# Patient Record
Sex: Male | Born: 1955 | Race: Black or African American | Hispanic: No | Marital: Single | Smoking: Current every day smoker
Health system: Southern US, Community
[De-identification: ages and names within clinical notes are randomized; demographics above are authoritative.]

## PROBLEM LIST (undated history)

## (undated) DIAGNOSIS — Z789 Other specified health status: Secondary | ICD-10-CM

---

## 2016-02-27 DIAGNOSIS — Z139 Encounter for screening, unspecified: Secondary | ICD-10-CM

## 2016-02-27 NOTE — Congregational Nurse Program (Unsigned)
Congregational Nurse Program Note  Date of Encounter: 02/27/2016  Past Medical History: No past medical history on file.  Encounter Details:     CNP Questionnaire - 02/27/16 1534    Patient Demographics   Is this a new or existing patient? New   Race Other   Patient Assistance   Location of Patient Assistance Not Applicable   Patient's financial/insurance status Medicaid;Low Income   Uninsured Patient No   Patient referred to apply for the following financial assistance Not Applicable   Food insecurities addressed Provided food supplies   Transportation assistance No   Assistance securing medications No   Educational health offerings Hypertension   Encounter Details   Primary purpose of visit Education/Health Concerns   Was an Emergency Department visit averted? Not Applicable   Does patient have a medical provider? No   Patient referred to Not Applicable   Was a mental health screening completed? (GAINS tool) No   Does patient have dental issues? No   Does patient have vision issues? No   Since previous encounter, have you referred patient for abnormal blood pressure that resulted in a new diagnosis or medication change? No   For Abstraction Use Only   Does patient have insurance? Yes       Has just arrived from [place].  Requested B/P check

## 2018-10-20 ENCOUNTER — Ambulatory Visit: Payer: Self-pay | Admitting: Family Medicine

## 2020-03-07 ENCOUNTER — Inpatient Hospital Stay (HOSPITAL_COMMUNITY): Payer: Medicaid Other

## 2020-03-07 ENCOUNTER — Inpatient Hospital Stay (HOSPITAL_COMMUNITY)
Admission: EM | Admit: 2020-03-07 | Discharge: 2020-07-03 | DRG: 003 | Disposition: A | Discharge status: Skilled Nursing Facility | Payer: Medicaid Other | Attending: Internal Medicine | Admitting: Internal Medicine

## 2020-03-07 ENCOUNTER — Encounter (HOSPITAL_COMMUNITY)
Admission: EM | Disposition: A | Discharge status: Skilled Nursing Facility | Payer: Self-pay | Source: Home / Self Care | Attending: Internal Medicine

## 2020-03-07 ENCOUNTER — Emergency Department (HOSPITAL_COMMUNITY): Payer: Medicaid Other

## 2020-03-07 ENCOUNTER — Encounter (HOSPITAL_COMMUNITY): Payer: Self-pay | Admitting: Physician Assistant

## 2020-03-07 DIAGNOSIS — N390 Urinary tract infection, site not specified: Secondary | ICD-10-CM | POA: Diagnosis not present

## 2020-03-07 DIAGNOSIS — I82443 Acute embolism and thrombosis of tibial vein, bilateral: Secondary | ICD-10-CM | POA: Diagnosis present

## 2020-03-07 DIAGNOSIS — C787 Secondary malignant neoplasm of liver and intrahepatic bile duct: Secondary | ICD-10-CM | POA: Diagnosis present

## 2020-03-07 DIAGNOSIS — I6523 Occlusion and stenosis of bilateral carotid arteries: Secondary | ICD-10-CM | POA: Diagnosis present

## 2020-03-07 DIAGNOSIS — F1290 Cannabis use, unspecified, uncomplicated: Secondary | ICD-10-CM | POA: Diagnosis present

## 2020-03-07 DIAGNOSIS — M7989 Other specified soft tissue disorders: Secondary | ICD-10-CM | POA: Diagnosis not present

## 2020-03-07 DIAGNOSIS — I2609 Other pulmonary embolism with acute cor pulmonale: Secondary | ICD-10-CM | POA: Diagnosis present

## 2020-03-07 DIAGNOSIS — D62 Acute posthemorrhagic anemia: Secondary | ICD-10-CM | POA: Diagnosis not present

## 2020-03-07 DIAGNOSIS — I468 Cardiac arrest due to other underlying condition: Secondary | ICD-10-CM | POA: Diagnosis present

## 2020-03-07 DIAGNOSIS — I615 Nontraumatic intracerebral hemorrhage, intraventricular: Secondary | ICD-10-CM | POA: Diagnosis not present

## 2020-03-07 DIAGNOSIS — J9621 Acute and chronic respiratory failure with hypoxia: Secondary | ICD-10-CM | POA: Diagnosis present

## 2020-03-07 DIAGNOSIS — I82453 Acute embolism and thrombosis of peroneal vein, bilateral: Secondary | ICD-10-CM | POA: Diagnosis present

## 2020-03-07 DIAGNOSIS — R1312 Dysphagia, oropharyngeal phase: Secondary | ICD-10-CM | POA: Diagnosis not present

## 2020-03-07 DIAGNOSIS — Z930 Tracheostomy status: Secondary | ICD-10-CM

## 2020-03-07 DIAGNOSIS — G931 Anoxic brain damage, not elsewhere classified: Secondary | ICD-10-CM | POA: Diagnosis present

## 2020-03-07 DIAGNOSIS — I469 Cardiac arrest, cause unspecified: Secondary | ICD-10-CM

## 2020-03-07 DIAGNOSIS — I10 Essential (primary) hypertension: Secondary | ICD-10-CM | POA: Diagnosis present

## 2020-03-07 DIAGNOSIS — J9600 Acute respiratory failure, unspecified whether with hypoxia or hypercapnia: Secondary | ICD-10-CM | POA: Diagnosis not present

## 2020-03-07 DIAGNOSIS — Z978 Presence of other specified devices: Secondary | ICD-10-CM

## 2020-03-07 DIAGNOSIS — I82433 Acute embolism and thrombosis of popliteal vein, bilateral: Secondary | ICD-10-CM | POA: Diagnosis present

## 2020-03-07 DIAGNOSIS — K0889 Other specified disorders of teeth and supporting structures: Secondary | ICD-10-CM | POA: Diagnosis not present

## 2020-03-07 DIAGNOSIS — Z09 Encounter for follow-up examination after completed treatment for conditions other than malignant neoplasm: Secondary | ICD-10-CM

## 2020-03-07 DIAGNOSIS — K769 Liver disease, unspecified: Secondary | ICD-10-CM

## 2020-03-07 DIAGNOSIS — M542 Cervicalgia: Secondary | ICD-10-CM | POA: Diagnosis not present

## 2020-03-07 DIAGNOSIS — R1310 Dysphagia, unspecified: Secondary | ICD-10-CM

## 2020-03-07 DIAGNOSIS — Z7189 Other specified counseling: Secondary | ICD-10-CM | POA: Diagnosis not present

## 2020-03-07 DIAGNOSIS — Z4659 Encounter for fitting and adjustment of other gastrointestinal appliance and device: Secondary | ICD-10-CM

## 2020-03-07 DIAGNOSIS — I2699 Other pulmonary embolism without acute cor pulmonale: Secondary | ICD-10-CM

## 2020-03-07 DIAGNOSIS — Z515 Encounter for palliative care: Secondary | ICD-10-CM

## 2020-03-07 DIAGNOSIS — I2602 Saddle embolus of pulmonary artery with acute cor pulmonale: Secondary | ICD-10-CM | POA: Diagnosis not present

## 2020-03-07 DIAGNOSIS — R160 Hepatomegaly, not elsewhere classified: Secondary | ICD-10-CM | POA: Diagnosis not present

## 2020-03-07 DIAGNOSIS — R4701 Aphasia: Secondary | ICD-10-CM | POA: Diagnosis not present

## 2020-03-07 DIAGNOSIS — Z681 Body mass index (BMI) 19 or less, adult: Secondary | ICD-10-CM

## 2020-03-07 DIAGNOSIS — J9601 Acute respiratory failure with hypoxia: Secondary | ICD-10-CM | POA: Diagnosis not present

## 2020-03-07 DIAGNOSIS — E861 Hypovolemia: Secondary | ICD-10-CM | POA: Diagnosis not present

## 2020-03-07 DIAGNOSIS — R739 Hyperglycemia, unspecified: Secondary | ICD-10-CM | POA: Diagnosis present

## 2020-03-07 DIAGNOSIS — T85598A Other mechanical complication of other gastrointestinal prosthetic devices, implants and grafts, initial encounter: Secondary | ICD-10-CM

## 2020-03-07 DIAGNOSIS — Z9181 History of falling: Secondary | ICD-10-CM

## 2020-03-07 DIAGNOSIS — R5381 Other malaise: Secondary | ICD-10-CM | POA: Diagnosis not present

## 2020-03-07 DIAGNOSIS — J9690 Respiratory failure, unspecified, unspecified whether with hypoxia or hypercapnia: Secondary | ICD-10-CM

## 2020-03-07 DIAGNOSIS — I63429 Cerebral infarction due to embolism of unspecified anterior cerebral artery: Secondary | ICD-10-CM | POA: Diagnosis not present

## 2020-03-07 DIAGNOSIS — C163 Malignant neoplasm of pyloric antrum: Secondary | ICD-10-CM | POA: Diagnosis present

## 2020-03-07 DIAGNOSIS — J439 Emphysema, unspecified: Secondary | ICD-10-CM | POA: Diagnosis present

## 2020-03-07 DIAGNOSIS — K254 Chronic or unspecified gastric ulcer with hemorrhage: Secondary | ICD-10-CM | POA: Diagnosis present

## 2020-03-07 DIAGNOSIS — R109 Unspecified abdominal pain: Secondary | ICD-10-CM

## 2020-03-07 DIAGNOSIS — D630 Anemia in neoplastic disease: Secondary | ICD-10-CM | POA: Diagnosis not present

## 2020-03-07 DIAGNOSIS — I82421 Acute embolism and thrombosis of right iliac vein: Secondary | ICD-10-CM | POA: Diagnosis present

## 2020-03-07 DIAGNOSIS — J40 Bronchitis, not specified as acute or chronic: Secondary | ICD-10-CM | POA: Diagnosis not present

## 2020-03-07 DIAGNOSIS — Z751 Person awaiting admission to adequate facility elsewhere: Secondary | ICD-10-CM

## 2020-03-07 DIAGNOSIS — A408 Other streptococcal sepsis: Secondary | ICD-10-CM | POA: Diagnosis not present

## 2020-03-07 DIAGNOSIS — E785 Hyperlipidemia, unspecified: Secondary | ICD-10-CM | POA: Diagnosis present

## 2020-03-07 DIAGNOSIS — I745 Embolism and thrombosis of iliac artery: Secondary | ICD-10-CM | POA: Diagnosis present

## 2020-03-07 DIAGNOSIS — M795 Residual foreign body in soft tissue: Secondary | ICD-10-CM

## 2020-03-07 DIAGNOSIS — F17210 Nicotine dependence, cigarettes, uncomplicated: Secondary | ICD-10-CM | POA: Diagnosis present

## 2020-03-07 DIAGNOSIS — E875 Hyperkalemia: Secondary | ICD-10-CM | POA: Diagnosis not present

## 2020-03-07 DIAGNOSIS — R451 Restlessness and agitation: Secondary | ICD-10-CM | POA: Diagnosis not present

## 2020-03-07 DIAGNOSIS — D72825 Bandemia: Secondary | ICD-10-CM | POA: Diagnosis not present

## 2020-03-07 DIAGNOSIS — R490 Dysphonia: Secondary | ICD-10-CM | POA: Diagnosis not present

## 2020-03-07 DIAGNOSIS — D72829 Elevated white blood cell count, unspecified: Secondary | ICD-10-CM

## 2020-03-07 DIAGNOSIS — R059 Cough, unspecified: Secondary | ICD-10-CM

## 2020-03-07 DIAGNOSIS — I611 Nontraumatic intracerebral hemorrhage in hemisphere, cortical: Secondary | ICD-10-CM | POA: Diagnosis not present

## 2020-03-07 DIAGNOSIS — I4510 Unspecified right bundle-branch block: Secondary | ICD-10-CM | POA: Diagnosis present

## 2020-03-07 DIAGNOSIS — Z7401 Bed confinement status: Secondary | ICD-10-CM

## 2020-03-07 DIAGNOSIS — I639 Cerebral infarction, unspecified: Secondary | ICD-10-CM | POA: Diagnosis not present

## 2020-03-07 DIAGNOSIS — F22 Delusional disorders: Secondary | ICD-10-CM | POA: Diagnosis not present

## 2020-03-07 DIAGNOSIS — E876 Hypokalemia: Secondary | ICD-10-CM | POA: Diagnosis not present

## 2020-03-07 DIAGNOSIS — I6340 Cerebral infarction due to embolism of unspecified cerebral artery: Secondary | ICD-10-CM | POA: Diagnosis not present

## 2020-03-07 DIAGNOSIS — R570 Cardiogenic shock: Secondary | ICD-10-CM | POA: Diagnosis present

## 2020-03-07 DIAGNOSIS — E43 Unspecified severe protein-calorie malnutrition: Secondary | ICD-10-CM | POA: Diagnosis present

## 2020-03-07 DIAGNOSIS — C7A8 Other malignant neuroendocrine tumors: Secondary | ICD-10-CM | POA: Diagnosis not present

## 2020-03-07 DIAGNOSIS — I82890 Acute embolism and thrombosis of other specified veins: Secondary | ICD-10-CM | POA: Diagnosis present

## 2020-03-07 DIAGNOSIS — E872 Acidosis: Secondary | ICD-10-CM | POA: Diagnosis present

## 2020-03-07 DIAGNOSIS — Z452 Encounter for adjustment and management of vascular access device: Secondary | ICD-10-CM | POA: Diagnosis not present

## 2020-03-07 DIAGNOSIS — I82413 Acute embolism and thrombosis of femoral vein, bilateral: Secondary | ICD-10-CM | POA: Diagnosis present

## 2020-03-07 DIAGNOSIS — Z66 Do not resuscitate: Secondary | ICD-10-CM | POA: Diagnosis not present

## 2020-03-07 DIAGNOSIS — J9503 Malfunction of tracheostomy stoma: Secondary | ICD-10-CM | POA: Diagnosis not present

## 2020-03-07 DIAGNOSIS — A409 Streptococcal sepsis, unspecified: Secondary | ICD-10-CM | POA: Diagnosis not present

## 2020-03-07 DIAGNOSIS — D649 Anemia, unspecified: Secondary | ICD-10-CM | POA: Diagnosis not present

## 2020-03-07 DIAGNOSIS — Z20822 Contact with and (suspected) exposure to covid-19: Secondary | ICD-10-CM | POA: Diagnosis present

## 2020-03-07 DIAGNOSIS — K922 Gastrointestinal hemorrhage, unspecified: Secondary | ICD-10-CM | POA: Clinically undetermined

## 2020-03-07 DIAGNOSIS — R54 Age-related physical debility: Secondary | ICD-10-CM | POA: Diagnosis present

## 2020-03-07 DIAGNOSIS — R29700 NIHSS score 0: Secondary | ICD-10-CM | POA: Diagnosis not present

## 2020-03-07 DIAGNOSIS — R64 Cachexia: Secondary | ICD-10-CM | POA: Diagnosis present

## 2020-03-07 DIAGNOSIS — R2681 Unsteadiness on feet: Secondary | ICD-10-CM | POA: Diagnosis not present

## 2020-03-07 DIAGNOSIS — W19XXXA Unspecified fall, initial encounter: Secondary | ICD-10-CM

## 2020-03-07 DIAGNOSIS — F1010 Alcohol abuse, uncomplicated: Secondary | ICD-10-CM | POA: Diagnosis present

## 2020-03-07 DIAGNOSIS — E162 Hypoglycemia, unspecified: Secondary | ICD-10-CM | POA: Diagnosis present

## 2020-03-07 DIAGNOSIS — K5900 Constipation, unspecified: Secondary | ICD-10-CM | POA: Diagnosis not present

## 2020-03-07 HISTORY — PX: ESOPHAGOGASTRODUODENOSCOPY (EGD) WITH PROPOFOL: SHX5813

## 2020-03-07 HISTORY — PX: IR EMBO ART  VEN HEMORR LYMPH EXTRAV  INC GUIDE ROADMAPPING: IMG5450

## 2020-03-07 HISTORY — PX: IR ANGIOGRAM SELECTIVE EACH ADDITIONAL VESSEL: IMG667

## 2020-03-07 HISTORY — DX: Cardiac arrest, cause unspecified: I46.9

## 2020-03-07 HISTORY — DX: Other specified health status: Z78.9

## 2020-03-07 HISTORY — PX: IR THROMBECT PRIM MECH INIT (INCLU) MOD SED: IMG2297

## 2020-03-07 HISTORY — PX: IR ANGIOGRAM VISCERAL SELECTIVE: IMG657

## 2020-03-07 HISTORY — PX: IR ANGIOGRAM PULMONARY BILATERAL SELECTIVE: IMG664

## 2020-03-07 HISTORY — PX: IR US GUIDE VASC ACCESS RIGHT: IMG2390

## 2020-03-07 HISTORY — PX: IR IVC FILTER PLMT / S&I /IMG GUID/MOD SED: IMG701

## 2020-03-07 LAB — CBC
HCT: 41 % (ref 39.0–52.0)
HCT: 35.4 % — ABNORMAL LOW (ref 39.0–52.0)
HCT: 36 % — ABNORMAL LOW (ref 39.0–52.0)
Hemoglobin: 13.3 g/dL (ref 13.0–17.0)
Hemoglobin: 11.9 g/dL — ABNORMAL LOW (ref 13.0–17.0)
Hemoglobin: 11.8 g/dL — ABNORMAL LOW (ref 13.0–17.0)
MCH: 32.7 pg (ref 26.0–34.0)
MCH: 33.1 pg (ref 26.0–34.0)
MCH: 32.8 pg (ref 26.0–34.0)
MCHC: 32.4 g/dL (ref 30.0–36.0)
MCHC: 33.6 g/dL (ref 30.0–36.0)
MCHC: 32.8 g/dL (ref 30.0–36.0)
MCV: 100.7 fL — ABNORMAL HIGH (ref 80.0–100.0)
MCV: 98.3 fL (ref 80.0–100.0)
MCV: 100 fL (ref 80.0–100.0)
Platelets: 161 10*3/uL (ref 150–400)
Platelets: 154 10*3/uL (ref 150–400)
Platelets: 155 10*3/uL (ref 150–400)
RBC: 4.07 MIL/uL — ABNORMAL LOW (ref 4.22–5.81)
RBC: 3.6 MIL/uL — ABNORMAL LOW (ref 4.22–5.81)
RBC: 3.6 MIL/uL — ABNORMAL LOW (ref 4.22–5.81)
RDW: 15.3 % (ref 11.5–15.5)
RDW: 15.3 % (ref 11.5–15.5)
RDW: 15.3 % (ref 11.5–15.5)
WBC: 13.1 10*3/uL — ABNORMAL HIGH (ref 4.0–10.5)
WBC: 10.8 10*3/uL — ABNORMAL HIGH (ref 4.0–10.5)
WBC: 10.8 10*3/uL — ABNORMAL HIGH (ref 4.0–10.5)
nRBC: 0 % (ref 0.0–0.2)
nRBC: 0 % (ref 0.0–0.2)
nRBC: 0 % (ref 0.0–0.2)

## 2020-03-07 LAB — BASIC METABOLIC PANEL
Anion gap: 9 (ref 5–15)
Anion gap: 7 (ref 5–15)
Anion gap: 7 (ref 5–15)
BUN: 9 mg/dL (ref 8–23)
BUN: 8 mg/dL (ref 8–23)
BUN: 8 mg/dL (ref 8–23)
CO2: 22 mmol/L (ref 22–32)
CO2: 20 mmol/L — ABNORMAL LOW (ref 22–32)
CO2: 20 mmol/L — ABNORMAL LOW (ref 22–32)
Calcium: 7.5 mg/dL — ABNORMAL LOW (ref 8.9–10.3)
Calcium: 7.4 mg/dL — ABNORMAL LOW (ref 8.9–10.3)
Calcium: 7.5 mg/dL — ABNORMAL LOW (ref 8.9–10.3)
Chloride: 103 mmol/L (ref 98–111)
Chloride: 104 mmol/L (ref 98–111)
Chloride: 108 mmol/L (ref 98–111)
Creatinine, Ser: 0.9 mg/dL (ref 0.61–1.24)
Creatinine, Ser: 0.76 mg/dL (ref 0.61–1.24)
Creatinine, Ser: 0.79 mg/dL (ref 0.61–1.24)
GFR calc Af Amer: >60 mL/min (ref >60)
GFR calc Af Amer: >60 mL/min (ref >60)
GFR calc Af Amer: >60 mL/min (ref >60)
GFR calc non Af Amer: >60 mL/min (ref >60)
GFR calc non Af Amer: >60 mL/min (ref >60)
GFR calc non Af Amer: >60 mL/min (ref >60)
Glucose, Bld: 196 mg/dL — ABNORMAL HIGH (ref 70–99)
Glucose, Bld: 110 mg/dL — ABNORMAL HIGH (ref 70–99)
Glucose, Bld: 119 mg/dL — ABNORMAL HIGH (ref 70–99)
Potassium: 3.6 mmol/L (ref 3.5–5.1)
Potassium: 4.2 mmol/L (ref 3.5–5.1)
Potassium: 4.5 mmol/L (ref 3.5–5.1)
Sodium: 134 mmol/L — ABNORMAL LOW (ref 135–145)
Sodium: 131 mmol/L — ABNORMAL LOW (ref 135–145)
Sodium: 135 mmol/L (ref 135–145)

## 2020-03-07 LAB — CBC WITH DIFFERENTIAL/PLATELET
Abs Immature Granulocytes: 0.21 10*3/uL — ABNORMAL HIGH (ref 0.00–0.07)
Basophils Absolute: 0 10*3/uL (ref 0.0–0.1)
Basophils Relative: 0 %
Eosinophils Absolute: 0 10*3/uL (ref 0.0–0.5)
Eosinophils Relative: 0 %
HCT: 40.9 % (ref 39.0–52.0)
Hemoglobin: 12 g/dL — ABNORMAL LOW (ref 13.0–17.0)
Immature Granulocytes: 3 %
Lymphocytes Relative: 42 %
Lymphs Abs: 3 10*3/uL (ref 0.7–4.0)
MCH: 32.8 pg (ref 26.0–34.0)
MCHC: 29.3 g/dL — ABNORMAL LOW (ref 30.0–36.0)
MCV: 111.7 fL — ABNORMAL HIGH (ref 80.0–100.0)
Monocytes Absolute: 0.6 10*3/uL (ref 0.1–1.0)
Monocytes Relative: 9 %
Neutro Abs: 3.3 10*3/uL (ref 1.7–7.7)
Neutrophils Relative %: 46 %
Platelets: 100 10*3/uL — ABNORMAL LOW (ref 150–400)
RBC: 3.66 MIL/uL — ABNORMAL LOW (ref 4.22–5.81)
RDW: 15.9 % — ABNORMAL HIGH (ref 11.5–15.5)
WBC: 7.1 10*3/uL (ref 4.0–10.5)
nRBC: 0.3 % — ABNORMAL HIGH (ref 0.0–0.2)

## 2020-03-07 LAB — URINALYSIS, ROUTINE W REFLEX MICROSCOPIC
Bilirubin Urine: NEGATIVE
Glucose, UA: >=500 mg/dL — AB
Ketones, ur: NEGATIVE mg/dL
Leukocytes,Ua: NEGATIVE
Nitrite: NEGATIVE
Protein, ur: >=300 mg/dL — AB
Specific Gravity, Urine: 1.015 (ref 1.005–1.030)
pH: 6 (ref 5.0–8.0)

## 2020-03-07 LAB — CBG MONITORING, ED: Glucose-Capillary: 217 mg/dL — ABNORMAL HIGH (ref 70–99)

## 2020-03-07 LAB — SALICYLATE LEVEL: Salicylate Lvl: <7 mg/dL — ABNORMAL LOW (ref 7.0–30.0)

## 2020-03-07 LAB — COMPREHENSIVE METABOLIC PANEL
ALT: 29 U/L (ref 0–44)
AST: 60 U/L — ABNORMAL HIGH (ref 15–41)
Albumin: 1.9 g/dL — ABNORMAL LOW (ref 3.5–5.0)
Alkaline Phosphatase: 55 U/L (ref 38–126)
Anion gap: 20 — ABNORMAL HIGH (ref 5–15)
BUN: 7 mg/dL — ABNORMAL LOW (ref 8–23)
CO2: 14 mmol/L — ABNORMAL LOW (ref 22–32)
Calcium: 8 mg/dL — ABNORMAL LOW (ref 8.9–10.3)
Chloride: 104 mmol/L (ref 98–111)
Creatinine, Ser: 1.19 mg/dL (ref 0.61–1.24)
GFR calc Af Amer: >60 mL/min (ref >60)
GFR calc non Af Amer: >60 mL/min (ref >60)
Glucose, Bld: 291 mg/dL — ABNORMAL HIGH (ref 70–99)
Potassium: 3.8 mmol/L (ref 3.5–5.1)
Sodium: 138 mmol/L (ref 135–145)
Total Bilirubin: 0.6 mg/dL (ref 0.3–1.2)
Total Protein: 4.4 g/dL — ABNORMAL LOW (ref 6.5–8.1)

## 2020-03-07 LAB — POCT I-STAT 7, (LYTES, BLD GAS, ICA,H+H)
Acid-base deficit: 5 mmol/L — ABNORMAL HIGH (ref 0.0–2.0)
Acid-base deficit: 3 mmol/L — ABNORMAL HIGH (ref 0.0–2.0)
Bicarbonate: 19.6 mmol/L — ABNORMAL LOW (ref 20.0–28.0)
Bicarbonate: 22.7 mmol/L (ref 20.0–28.0)
Calcium, Ion: 1.15 mmol/L (ref 1.15–1.40)
Calcium, Ion: 1.17 mmol/L (ref 1.15–1.40)
HCT: 41 % (ref 39.0–52.0)
HCT: 36 % — ABNORMAL LOW (ref 39.0–52.0)
Hemoglobin: 13.9 g/dL (ref 13.0–17.0)
Hemoglobin: 12.2 g/dL — ABNORMAL LOW (ref 13.0–17.0)
O2 Saturation: 97 %
O2 Saturation: 100 %
Patient temperature: 34.1
Patient temperature: 35.4
Potassium: 4 mmol/L (ref 3.5–5.1)
Potassium: 4.1 mmol/L (ref 3.5–5.1)
Sodium: 131 mmol/L — ABNORMAL LOW (ref 135–145)
Sodium: 134 mmol/L — ABNORMAL LOW (ref 135–145)
TCO2: 21 mmol/L — ABNORMAL LOW (ref 22–32)
TCO2: 24 mmol/L (ref 22–32)
pCO2 arterial: 31.6 mmHg — ABNORMAL LOW (ref 32.0–48.0)
pCO2 arterial: 39 mmHg (ref 32.0–48.0)
pH, Arterial: 7.388 (ref 7.350–7.450)
pH, Arterial: 7.366 (ref 7.350–7.450)
pO2, Arterial: 81 mmHg — ABNORMAL LOW (ref 83.0–108.0)
pO2, Arterial: 346 mmHg — ABNORMAL HIGH (ref 83.0–108.0)

## 2020-03-07 LAB — GLUCOSE, CAPILLARY
Glucose-Capillary: 106 mg/dL — ABNORMAL HIGH (ref 70–99)
Glucose-Capillary: 210 mg/dL — ABNORMAL HIGH (ref 70–99)
Glucose-Capillary: 95 mg/dL (ref 70–99)

## 2020-03-07 LAB — RESPIRATORY PANEL BY RT PCR (FLU A&B, COVID)
Influenza A by PCR: NEGATIVE
Influenza B by PCR: NEGATIVE
SARS Coronavirus 2 by RT PCR: NEGATIVE

## 2020-03-07 LAB — LACTIC ACID, PLASMA
Lactic Acid, Venous: 2.2 mmol/L (ref 0.5–1.9)
Lactic Acid, Venous: 1 mmol/L (ref 0.5–1.9)

## 2020-03-07 LAB — RAPID URINE DRUG SCREEN, HOSP PERFORMED
Amphetamines: NOT DETECTED
Barbiturates: NOT DETECTED
Benzodiazepines: NOT DETECTED
Cocaine: NOT DETECTED
Opiates: NOT DETECTED
Tetrahydrocannabinol: NOT DETECTED

## 2020-03-07 LAB — TYPE AND SCREEN
ABO/RH(D): B POS
Antibody Screen: NEGATIVE

## 2020-03-07 LAB — PROTIME-INR
INR: 1.3 — ABNORMAL HIGH (ref 0.8–1.2)
Prothrombin Time: 16.1 seconds — ABNORMAL HIGH (ref 11.4–15.2)

## 2020-03-07 LAB — HIV ANTIBODY (ROUTINE TESTING W REFLEX): HIV Screen 4th Generation wRfx: NONREACTIVE

## 2020-03-07 LAB — HEMOGLOBIN A1C
Hgb A1c MFr Bld: 5.2 % (ref 4.8–5.6)
Mean Plasma Glucose: 102.54 mg/dL

## 2020-03-07 LAB — TROPONIN I (HIGH SENSITIVITY)
Troponin I (High Sensitivity): 73 ng/L — ABNORMAL HIGH (ref <18)
Troponin I (High Sensitivity): 8728 ng/L (ref <18)

## 2020-03-07 LAB — BETA-HYDROXYBUTYRIC ACID: Beta-Hydroxybutyric Acid: 0.05 mmol/L (ref 0.05–0.27)

## 2020-03-07 LAB — AMYLASE: Amylase: 242 U/L — ABNORMAL HIGH (ref 28–100)

## 2020-03-07 LAB — APTT: aPTT: 39 seconds — ABNORMAL HIGH (ref 24–36)

## 2020-03-07 LAB — BRAIN NATRIURETIC PEPTIDE: B Natriuretic Peptide: 74.6 pg/mL (ref 0.0–100.0)

## 2020-03-07 LAB — PROCALCITONIN: Procalcitonin: 1.71 ng/mL

## 2020-03-07 LAB — ETHANOL: Alcohol, Ethyl (B): <10 mg/dL (ref <10)

## 2020-03-07 LAB — ECHOCARDIOGRAM COMPLETE
Height: 74 in
Weight: 2560 oz

## 2020-03-07 LAB — MRSA PCR SCREENING: MRSA by PCR: NEGATIVE

## 2020-03-07 LAB — ABO/RH: ABO/RH(D): B POS

## 2020-03-07 SURGERY — ESOPHAGOGASTRODUODENOSCOPY (EGD) WITH PROPOFOL

## 2020-03-07 MED ORDER — SODIUM CHLORIDE 0.9 % IV SOLN: INTRAVENOUS | Status: DC

## 2020-03-07 MED ORDER — HEPARIN SODIUM (PORCINE) 1000 UNIT/ML IJ SOLN
INTRAMUSCULAR | Status: AC | PRN
  Administered 2020-03-07: 4000 [IU] via INTRAVENOUS

## 2020-03-07 MED ORDER — LACTATED RINGERS IV SOLN: INTRAVENOUS | Status: DC

## 2020-03-07 MED ORDER — LIDOCAINE HCL 1 % IJ SOLN
INTRAMUSCULAR | Status: AC | PRN
  Administered 2020-03-07: 10 mL

## 2020-03-07 MED ORDER — IOHEXOL 300 MG/ML  SOLN
150.0000 mL | Freq: Once | INTRAMUSCULAR | Status: AC | PRN
  Administered 2020-03-07: 100 mL

## 2020-03-07 MED ORDER — FENTANYL CITRATE (PF) 100 MCG/2ML IJ SOLN
INTRAMUSCULAR | Status: AC
  Filled 2020-03-07: qty 2

## 2020-03-07 MED ORDER — SODIUM CHLORIDE 0.9 % IV SOLN
80.0000 mg | Freq: Once | INTRAVENOUS | Status: AC
  Administered 2020-03-07: 80 mg via INTRAVENOUS
  Filled 2020-03-07: qty 80

## 2020-03-07 MED ORDER — EPINEPHRINE 1 MG/10ML IJ SOSY
PREFILLED_SYRINGE | INTRAMUSCULAR | Status: AC | PRN
  Administered 2020-03-07 (×2): 1 mg via INTRAVENOUS

## 2020-03-07 MED ORDER — PROPOFOL 1000 MG/100ML IV EMUL
0.0000 ug/kg/min | INTRAVENOUS | Status: DC
  Administered 2020-03-07: 15 ug/kg/min via INTRAVENOUS
  Administered 2020-03-07 (×2): 50 ug/kg/min via INTRAVENOUS
  Administered 2020-03-08: 40 ug/kg/min via INTRAVENOUS
  Administered 2020-03-08: 45 ug/kg/min via INTRAVENOUS
  Administered 2020-03-08: 40 ug/kg/min via INTRAVENOUS
  Administered 2020-03-08 – 2020-03-09 (×4): 50 ug/kg/min via INTRAVENOUS
  Administered 2020-03-09: 40 ug/kg/min via INTRAVENOUS
  Administered 2020-03-09 (×3): 50 ug/kg/min via INTRAVENOUS
  Administered 2020-03-10: 40 ug/kg/min via INTRAVENOUS
  Administered 2020-03-10 (×2): 50 ug/kg/min via INTRAVENOUS
  Administered 2020-03-10 – 2020-03-11 (×3): 40 ug/kg/min via INTRAVENOUS
  Administered 2020-03-11: 25 ug/kg/min via INTRAVENOUS
  Filled 2020-03-07 (×21): qty 100

## 2020-03-07 MED ORDER — FENTANYL CITRATE (PF) 100 MCG/2ML IJ SOLN
INTRAMUSCULAR | Status: AC | PRN
  Administered 2020-03-07 (×2): 50 ug via INTRAVENOUS

## 2020-03-07 MED ORDER — CHLORHEXIDINE GLUCONATE 0.12% ORAL RINSE (MEDLINE KIT)
15.0000 mL | Freq: Two times a day (BID) | OROMUCOSAL | Status: DC
  Administered 2020-03-07 – 2020-05-18 (×99): 15 mL via OROMUCOSAL

## 2020-03-07 MED ORDER — IOHEXOL 300 MG/ML  SOLN
100.0000 mL | Freq: Once | INTRAMUSCULAR | Status: AC | PRN
  Administered 2020-03-07: 70 mL

## 2020-03-07 MED ORDER — NOREPINEPHRINE 16 MG/250ML-% IV SOLN
0.0000 ug/min | INTRAVENOUS | Status: DC
  Administered 2020-03-07 – 2020-03-10 (×2): 7 ug/min via INTRAVENOUS
  Administered 2020-03-12 – 2020-03-13 (×2): 10 ug/min via INTRAVENOUS
  Administered 2020-03-14: 6 ug/min via INTRAVENOUS
  Filled 2020-03-07 (×5): qty 250

## 2020-03-07 MED ORDER — FAMOTIDINE IN NACL 20-0.9 MG/50ML-% IV SOLN: 20.0000 mg | Freq: Two times a day (BID) | INTRAVENOUS | Status: DC

## 2020-03-07 MED ORDER — SODIUM CHLORIDE 0.9 % IV SOLN
8.0000 mg/h | INTRAVENOUS | Status: AC
  Administered 2020-03-07 – 2020-03-10 (×7): 8 mg/h via INTRAVENOUS
  Filled 2020-03-07 (×10): qty 80

## 2020-03-07 MED ORDER — CHLORHEXIDINE GLUCONATE CLOTH 2 % EX PADS
6.0000 | MEDICATED_PAD | Freq: Every day | CUTANEOUS | Status: DC
  Administered 2020-03-08 – 2020-03-16 (×9): 6 via TOPICAL

## 2020-03-07 MED ORDER — HEPARIN SODIUM (PORCINE) 1000 UNIT/ML IJ SOLN
INTRAMUSCULAR | Status: AC
  Filled 2020-03-07: qty 1

## 2020-03-07 MED ORDER — LORAZEPAM 2 MG/ML IJ SOLN
1.0000 mg | Freq: Once | INTRAMUSCULAR | Status: AC
  Administered 2020-03-07: 1 mg via INTRAVENOUS
  Filled 2020-03-07: qty 1

## 2020-03-07 MED ORDER — ORAL CARE MOUTH RINSE
15.0000 mL | OROMUCOSAL | Status: DC
  Administered 2020-03-07 – 2020-04-08 (×256): 15 mL via OROMUCOSAL

## 2020-03-07 MED ORDER — INSULIN ASPART 100 UNIT/ML ~~LOC~~ SOLN
0.0000 [IU] | SUBCUTANEOUS | Status: DC
  Administered 2020-03-07: 3 [IU] via SUBCUTANEOUS

## 2020-03-07 MED ORDER — NOREPINEPHRINE 4 MG/250ML-% IV SOLN: 0.0000 ug/min | INTRAVENOUS | Status: DC

## 2020-03-07 MED ORDER — FENTANYL CITRATE (PF) 100 MCG/2ML IJ SOLN
50.0000 ug | INTRAMUSCULAR | Status: AC | PRN
  Administered 2020-03-07 (×3): 50 ug via INTRAVENOUS
  Filled 2020-03-07 (×2): qty 2

## 2020-03-07 MED ORDER — HEPARIN (PORCINE) 25000 UT/250ML-% IV SOLN
1200.0000 [IU]/h | INTRAVENOUS | Status: DC
  Administered 2020-03-07: 1200 [IU]/h via INTRAVENOUS
  Filled 2020-03-07: qty 250

## 2020-03-07 MED ORDER — IOHEXOL 300 MG/ML  SOLN
150.0000 mL | Freq: Once | INTRAMUSCULAR | Status: AC | PRN
  Administered 2020-03-07: 80 mL

## 2020-03-07 MED ORDER — PROPOFOL 1000 MG/100ML IV EMUL
INTRAVENOUS | Status: AC
  Administered 2020-03-07: 15 ug/kg/min
  Filled 2020-03-07: qty 100

## 2020-03-07 MED ORDER — IOHEXOL 350 MG/ML SOLN
100.0000 mL | Freq: Once | INTRAVENOUS | Status: AC
  Administered 2020-03-07: 80 mL via INTRAVENOUS

## 2020-03-07 MED ORDER — IOHEXOL 300 MG/ML  SOLN
125.0000 mL | Freq: Once | INTRAMUSCULAR | Status: AC | PRN
  Administered 2020-03-07: 80 mL

## 2020-03-07 MED ORDER — GELATIN ABSORBABLE 12-7 MM EX MISC
CUTANEOUS | Status: AC | PRN
  Administered 2020-03-07 (×2): 1 via TOPICAL

## 2020-03-07 MED ORDER — LIDOCAINE HCL 1 % IJ SOLN
INTRAMUSCULAR | Status: AC
  Filled 2020-03-07: qty 20

## 2020-03-07 MED ORDER — NOREPINEPHRINE 4 MG/250ML-% IV SOLN
INTRAVENOUS | Status: AC
  Filled 2020-03-07: qty 250

## 2020-03-07 MED ORDER — PANTOPRAZOLE SODIUM 40 MG IV SOLR
40.0000 mg | Freq: Two times a day (BID) | INTRAVENOUS | Status: DC
  Administered 2020-03-11 – 2020-04-04 (×50): 40 mg via INTRAVENOUS
  Filled 2020-03-07 (×50): qty 40

## 2020-03-07 MED ORDER — FENTANYL CITRATE (PF) 100 MCG/2ML IJ SOLN
INTRAMUSCULAR | Status: AC
  Filled 2020-03-07: qty 4

## 2020-03-07 MED ORDER — FENTANYL CITRATE (PF) 100 MCG/2ML IJ SOLN
50.0000 ug | INTRAMUSCULAR | Status: DC | PRN
  Administered 2020-03-08 (×3): 100 ug via INTRAVENOUS
  Administered 2020-03-08: 50 ug via INTRAVENOUS
  Administered 2020-03-08 (×3): 100 ug via INTRAVENOUS
  Administered 2020-03-08: 50 ug via INTRAVENOUS
  Administered 2020-03-09 – 2020-03-10 (×8): 100 ug via INTRAVENOUS
  Administered 2020-03-10: 200 ug via INTRAVENOUS
  Administered 2020-03-10 – 2020-03-15 (×11): 100 ug via INTRAVENOUS
  Filled 2020-03-07 (×21): qty 2
  Filled 2020-03-07: qty 4
  Filled 2020-03-07 (×6): qty 2

## 2020-03-07 MED ORDER — LACTATED RINGERS IV BOLUS
1000.0000 mL | Freq: Once | INTRAVENOUS | Status: AC
  Administered 2020-03-07: 1000 mL via INTRAVENOUS

## 2020-03-07 MED ORDER — HEPARIN BOLUS VIA INFUSION
4000.0000 [IU] | Freq: Once | INTRAVENOUS | Status: DC
  Filled 2020-03-07: qty 4000

## 2020-03-07 MED ORDER — EPINEPHRINE HCL 5 MG/250ML IV SOLN IN NS
0.5000 ug/min | Freq: Once | INTRAVENOUS | Status: AC
  Administered 2020-03-07: 0.5 ug/min via INTRAVENOUS
  Filled 2020-03-07: qty 250

## 2020-03-07 MED ORDER — MIDAZOLAM HCL (PF) 5 MG/ML IJ SOLN
INTRAMUSCULAR | Status: AC
  Filled 2020-03-07: qty 2

## 2020-03-07 MED ORDER — HEPARIN SODIUM (PORCINE) 5000 UNIT/ML IJ SOLN: 5000.0000 [IU] | Freq: Three times a day (TID) | INTRAMUSCULAR | Status: DC

## 2020-03-07 MED ORDER — ENOXAPARIN SODIUM 40 MG/0.4ML ~~LOC~~ SOLN: 40.0000 mg | SUBCUTANEOUS | Status: DC

## 2020-03-07 MED ORDER — DOCUSATE SODIUM 100 MG PO CAPS
100.0000 mg | ORAL_CAPSULE | Freq: Two times a day (BID) | ORAL | Status: DC | PRN
  Administered 2020-04-25 – 2020-06-21 (×8): 100 mg via ORAL
  Filled 2020-03-07 (×10): qty 1

## 2020-03-07 MED FILL — Medication: Qty: 1 | Status: AC

## 2020-03-07 SURGICAL SUPPLY — 15 items

## 2020-03-07 NOTE — Progress Notes (Signed)
Routine EEG complete - results pending.Marland Kitchen  LTM EEG  to immediately follow Routine EEG

## 2020-03-07 NOTE — ED Provider Notes (Signed)
[facility] EMERGENCY DEPARTMENT Provider Note   CSN: [id number] Arrival date & time: 03/07/20  [id number]   History Chief Complaint  Patient presents with  . Cardiac Arrest    Kenneth Sullivan is a 64 y.o. male.  The history is provided by the EMS personnel. The history is limited by the condition of the patient (Unresponsive).  Cardiac Arrest He was brought in by ambulance as CPR.  EMS states they were called to the home for shortness of breath and noted very diminished breath sounds.  When they got him into the ambulance, he became markedly bradycardic and then went into asystole.  CPR was initiated and [facility] airway inserted.  He received 3 mg of epinephrine and did achieve return of spontaneous circulation just prior to arrival in the ED.  EMS reports approximately 20 minutes of CPR total.  No past medical history on file.  There are no problems to display for this patient.   ** The histories are not reviewed yet. Please review them in the "History" navigator section and refresh this [place].     No family history on file.  Social History   Tobacco Use  . Smoking status: Not on file  Substance Use Topics  . Alcohol use: Not on file  . Drug use: Not on file    Home Medications Prior to Admission medications   Not on File    Allergies    Patient has no allergy information on record.  Review of Systems   Review of Systems  Unable to perform ROS: Patient unresponsive    Physical Exam Updated Vital Signs BP 109/70   Pulse (!) 44   Resp 18   Physical Exam Vitals and nursing note reviewed.   64 year old male, unresponsive and being ventilated through a [facility] airway. Vital signs are significant for low heart rate. Oxygen saturation is 94%, which is normal. Head is normocephalic and atraumatic. PERRLA, EOMI. Oropharynx is clear. Neck is without adenopathy or JVD. Lungs are clear without rales, wheezes, or rhonchi.  Air movement is symmetric. Chest  has no deformity. Heart has regular rate and rhythm without murmur. Abdomen is soft, flat, without masses or hepatosplenomegaly. Extremities have no cyanosis or edema. Skin is warm and dry without rash. Neurologic: GCS=3.   ED Results / Procedures / Treatments   Labs (all labs ordered are listed, but only abnormal results are displayed) Labs Reviewed  CBG MONITORING, ED - Abnormal; Notable for the following components:      Result Value   Glucose-Capillary 217 (*)    All other components within normal limits  RESPIRATORY PANEL BY RT PCR (FLU A&B, COVID)  COMPREHENSIVE METABOLIC PANEL  BRAIN NATRIURETIC PEPTIDE  CBC WITH DIFFERENTIAL/PLATELET  RAPID URINE DRUG SCREEN, HOSP PERFORMED  URINALYSIS, ROUTINE W REFLEX MICROSCOPIC  TROPONIN I (HIGH SENSITIVITY)    EKG EKG Interpretation  Date/Time:  Tuesday March 07 2020 07:01:58 EDT Ventricular Rate:  109 PR Interval:    QRS Duration: 146 QT Interval:  362 QTC Calculation: 488 R Axis:   -94 Text Interpretation: Sinus tachycardia IVCD, consider atypical RBBB Inferior infarct, old Abnormal lateral Q waves Baseline wander in lead(s) V4 No old tracing to compare Confirmed by Delora Fuel ([id number]) on 03/07/2020 7:14:28 AM   Radiology DG Chest Portable 1 View  Result Date: 03/07/2020 CLINICAL DATA:  Intubation.  Respiratory distress EXAM: PORTABLE CHEST 1 VIEW COMPARISON:  None. FINDINGS: Endotracheal tube tip is just below the clavicular heads. Normal heart size  and mediastinal contours. There is no edema, consolidation, effusion, or pneumothorax. IMPRESSION: 1. Unremarkable endotracheal tube positioning. 2. No visible cardiopulmonary disease. Electronically Signed   By: Monte Fantasia M.D.   On: 03/07/2020 07:11    Procedures Date/Time: 03/07/2020 6:50 AM Performed by: Delora Fuel, MD Oxygen Delivery Method: Ambu bag Preoxygenation: Pre-oxygenation with 100% oxygen Ventilation: Mask ventilation without difficulty Laryngoscope  Size: Glidescope and 3 Grade View: Grade I Tube size: 7.5 mm Number of attempts: 1 Airway Equipment and Method: Rigid stylet and Video-laryngoscopy Placement Confirmation: ETT inserted through vocal cords under direct vision,  CO2 detector and Breath sounds checked- equal and bilateral Secured at: 25 cm Tube secured with: ETT holder Dental Injury: Teeth and Oropharynx as per pre-operative assessment        CRITICAL CARE Performed by: Delora Fuel Total critical care time: 95 minutes Critical care time was exclusive of separately billable procedures and treating other patients. Critical care was necessary to treat or prevent imminent or life-threatening deterioration. Critical care was time spent personally by me on the following activities: development of treatment plan with patient and/or surrogate as well as nursing, discussions with consultants, evaluation of patient's response to treatment, examination of patient, obtaining history from patient or surrogate, ordering and performing treatments and interventions, ordering and review of laboratory studies, ordering and review of radiographic studies, pulse oximetry and re-evaluation of patient's condition.  Medications Ordered in ED Medications  EPINEPHrine (ADRENALIN) 1 MG/10ML injection (1 mg Intravenous Given 03/07/20 0652)  EPINEPHrine (ADRENALIN) 4 mg in NS 250 mL (0.016 mg/mL) premix infusion (0.5 mcg/min Intravenous Given 03/07/20 [id number])    ED Course  I have reviewed the triage vital signs and the nursing notes.  Pertinent labs & imaging results that were available during my care of the patient were reviewed by me and considered in my medical decision making (see chart for details).  MDM Rules/Calculators/A&P Patient arrived with out of hospital cardiac arrest which appears to be primarily a respiratory event, return of spontaneous circulation achieved just prior to arrival in the ED.  In the ED, [facility] Pace airway was replaced with an  endotracheal tube.  As this was done, he did become bradycardic and was given additional epinephrine and he is placed on an epinephrine drip.  ECG shows a right bundle branch block with low voltage.  Portable chest x-ray shows clear lungs and adequate positioning of endotracheal tube.  I have spoken with the patient's wife who states that he has no significant history but is a cigarette smoker.  Because of age and relatively brief period of CPR, patient is felt to be a likely candidate for code cool.  I have discussed case with Dr. Shearon Stalls of critical care service who requests a CT of the head be obtained and he will come to evaluate the patient for possible therapeutic hypothermia.  Case is also discussed with Dr. Percival Spanish of cardiology service who agrees to see the patient in consultation.    Final Clinical Impression(s) / ED Diagnoses Final diagnoses:  Cardiac arrest [facility])    Rx / DC Orders ED Discharge Orders    None       Delora Fuel, MD [id number] [phone]

## 2020-03-07 NOTE — Progress Notes (Addendum)
PCCM progress note  CTA with bilateral PE CT head with chronic changes, no acute changes  Pt started having small amount of maroon stools and bloody output from NG tube (about 20cc) on arrival to ICU. He is at high risk for GI bleed given etoh use, low platelets and possible cirrhosis.  Assessment/Plan: This a tough situation He will need at least anticoagulation with heparin for PE presenting as cardiac arrest.  Lytics (systemic or catheter directed) are contraindicated now. Can consider as salvage therapy if he becomes unstable.   Discussed with GI Dr. Michail Sermon Hold off on heparin temporarily until he can get a bedside EGD We may be able to start heparin after procedure if no evidence of active bleed.  Place CVL and a line Start PPI drip. Monitor CBC closely Contact IR to see if we can perform mechanical thrombectormy  Updated his next of kin Kenneth Sullivan (Niece) [phone] who is trying to get her by tomorrow. She wants full code for now.   The patient is critically ill with multiple organ system failure and requires high complexity decision making for assessment and support, frequent evaluation and titration of therapies, advanced monitoring, review of radiographic studies and interpretation of complex data.   Critical Care Time devoted to patient care services, exclusive of separately billable procedures, described in this note is 35 minutes.   Kenneth Garfinkel MD [place] [facility] Please see [email or web address] for pager details.  03/07/2020, 11:29 AM

## 2020-03-07 NOTE — Progress Notes (Signed)
LTM EEG hooked up and running - no initial skin breakdown - push button tested - neuro notified.  Same leads used.  

## 2020-03-07 NOTE — Procedures (Signed)
Interventional Radiology Procedure Note  Procedure:   US guided access right CFV.  US guided access right IJ vein  IVC filter placement via the RIJ approach. Attempt at mechanical/aspiration thrombectomy of right PA thrombus/PE.   Findings: Right iliac DVT to the confluence.  Iliocaval DVT, below the renal veins. Segmental PE of the right PA's. After multiple attempts for debulking of the segmental PE burden, we terminated the mechanical aspiration, to proceed with mesenteric angiogram and possible embolization.   Complications: None  Recommendations:  - Remains hemodynamically unchanged in VIR. - Proceed with mesenteric angiogram for known hemorrhaging gastric ulcer. - Routine wound care   Signed,  Dulcy Fanny. Earleen Newport, DO

## 2020-03-07 NOTE — Procedures (Signed)
Interventional Radiology Procedure Note  Procedure:   US guided right CFA access for mesenteric angiogram and empiric embolization of the right gastric artery and the GDA  Exoseal for hemostasis .  Complications: None  Recommendations:  - ICU care - Do not submerge for 7 days - Routine wound care of the right femoral access site. - serial H&H  Signed,  Dulcy Fanny. Earleen Newport, DO

## 2020-03-07 NOTE — Progress Notes (Signed)
OG removed per Dr Kathline Magic orders after endoscopy

## 2020-03-07 NOTE — Procedures (Signed)
Arterial Catheter Insertion Procedure Note Roosevelt Gomezgarcia [id number] 02-Dec-1956  Procedure: Insertion of Arterial Catheter  Indications: Blood pressure monitoring and Frequent blood sampling  Procedure Details Consent: Unable to obtain consent because of altered level of consciousness. Time Out: Verified patient identification, verified procedure, site/side was marked, verified correct patient position, special equipment/implants available, medications/allergies/relevent history reviewed, required imaging and test results available.  Performed  Maximum sterile technique was used including antiseptics, cap, gloves, gown, hand hygiene, mask and sheet. Skin prep: Chlorhexidine; local anesthetic administered 20 gauge catheter was inserted into left femoral artery using the Seldinger technique. ULTRASOUND GUIDANCE USED: YES Evaluation Blood flow good; BP tracing good. Complications: No apparent complications.   [name] 03/07/2020 Erick Colace ACNP-BC [place] Pager # [phone] OR # [phone] if no answer

## 2020-03-07 NOTE — Progress Notes (Addendum)
Patient ID: Giscard Noun, male   DOB: 08-May-1956, 64 y.o.   MRN: [id number]  Very large cratered distal gastric ulcer with active oozing of blood and multiple adherent clots not amenable to endoscopic management. High risk for worsened bleeding and perforation. Would recommend embolization with need for IV Heparin due to bilateral PEs. D/W Dr. Earleen Newport (IR) and Dr. Wonda Amis of CCM. If rebleeding occurs, then needs surgery if embolization is not successful. Continue Protonix drip. D/C oral gastric tube to reduce suction trauma to distal ulcer. See endopro for complete details of procedure. Unable to reach niece to update on procedure results.

## 2020-03-07 NOTE — Sedation Documentation (Addendum)
5 Fr exoseal deployed in right groin at [facility]

## 2020-03-07 NOTE — Procedures (Signed)
Central Venous Catheter Insertion Procedure Note Kenneth Sullivan [id number] [birth year]  Procedure: Insertion of Central Venous Catheter Indications: Assessment of intravascular volume, Drug and/or fluid administration and Frequent blood sampling  Procedure Details Consent: Unable to obtain consent because of emergent medical necessity. Time Out: Verified patient identification, verified procedure, site/side was marked, verified correct patient position, special equipment/implants available, medications/allergies/relevent history reviewed, required imaging and test results available.  Performed  Maximum sterile technique was used including antiseptics, cap, gloves, gown, hand hygiene, mask and sheet. Skin prep: Chlorhexidine; local anesthetic administered A antimicrobial bonded/coated triple lumen catheter was placed in the left subclavian vein using the Seldinger technique.  Evaluation Blood flow good Complications: No apparent complications Patient did tolerate procedure well. Chest X-ray ordered to verify placement.  CXR: pending.  Kenneth Sullivan 03/07/2020, 12:31 PM  Erick Colace ACNP-BC [place] Pager # [phone] OR # [phone] if no answer

## 2020-03-07 NOTE — Progress Notes (Signed)
Interventional Radiology Progress Note   64 yo male with a history of cardiac arrest secondary to massive PE, with resuscitation/ROSC, ICU status.   CTA shows central thrombus in the PA.   History of GI bleeding, with upper endo demonstrating large oozing ulcer in the pre-pyloric stomach.   IVC filter and mesenteric angio possible embolization are indicated, in addition to the mechanical/aspiration thrombectomy.   Have discussed with his niece.     Signed,  Dulcy Fanny. Earleen Newport, DO

## 2020-03-07 NOTE — Progress Notes (Addendum)
ANTICOAGULATION CONSULT NOTE  Pharmacy Consult for heparin Indication: pulmonary embolus  No Known Allergies  Patient Measurements: Height: 6\' 2"  (188 cm) Weight: 160 lb (72.6 kg) IBW/kg (Calculated) : 82.2 Heparin Dosing Weight: TBW  Vital Signs: Temp: 95.7 F (35.4 C) (03/23 2000) Temp Source: Bladder (03/23 1510) BP: 93/81 (03/23 2000) Pulse Rate: 84 (03/23 1942)  Labs: Recent Labs    03/07/20 0710 03/07/20 0710 03/07/20 1144 03/07/20 1144 03/07/20 1243 03/07/20 1944 03/07/20 1949  HGB 12.0*   < > 13.9   < > 13.3  --  12.2*  HCT 40.9   < > 41.0  --  41.0  --  36.0*  PLT 100*  --   --   --  161  --   --   APTT  --   --   --   --  39*  --   --   LABPROT  --   --   --   --  16.1*  --   --   INR  --   --   --   --  1.3*  --   --   CREATININE 1.19  --   --   --  0.90 0.76  --   TROPONINIHS 73*  --   --   --  8,728*  --   --    < > = values in this interval not displayed.    Estimated Creatinine Clearance: 98.3 mL/min (by C-G formula based on SCr of 0.76 mg/dL).   Medical History: Past Medical History:  Diagnosis Date  . Cardiac arrest ([place]) 03/07/2020  . Known health problems: none    No recent medical care, has not had a physical in years   Assessment: 33 YOM presenting with asystolic arrest, concern for PE, not on anticoagulation PTA.    CTA showing bilateral PE. Had maroon stools and bloody output from NG upon arrival to ICU. Upper endoscopy showing large cratered oozing gastric ulcer. Went to IR for IVC filter placement and attempted mechanical aspiration of PE. Underwent US CFA access for mesenteric angiogram - exoseal for hemostasis.   Plan:  Discussed with IR and CCM - no heparin to restart   Antonietta Jewel, PharmD, BCCCP Clinical Pharmacist  Phone: [phone]  Please check AMION for all [place] phone numbers After 10:00 PM, call Reader [phone] 03/07/2020 8:43 PM

## 2020-03-07 NOTE — Progress Notes (Signed)
Patient transported to [id number] without complications. RN at bedside.

## 2020-03-07 NOTE — ED Notes (Signed)
Ice packs removed, verbal order by CCM to keep pt at normothermia

## 2020-03-07 NOTE — Progress Notes (Signed)
ANTICOAGULATION CONSULT NOTE - Initial Consult  Pharmacy Consult for heparin Indication: pulmonary embolus  No Known Allergies  Patient Measurements: Height: 6\' 2"  (188 cm) Weight: 160 lb (72.6 kg) IBW/kg (Calculated) : 82.2 Heparin Dosing Weight: TBW  Vital Signs: Temp: 93.5 F (34.2 C) (03/23 0925) BP: 93/78 (03/23 0925) Pulse Rate: 107 (03/23 0845)  Labs: Recent Labs    03/07/20 0710  HGB 12.0*  HCT 40.9  PLT 100*  CREATININE 1.19  TROPONINIHS 73*    Estimated Creatinine Clearance: 66.1 mL/min (by C-G formula based on SCr of 1.19 mg/dL).   Medical History: Past Medical History:  Diagnosis Date  . Cardiac arrest ([place]) 03/07/2020  . Known health problems: none    No recent medical care, has not had a physical in years   Assessment: 89 YOM presenting with asystolic arrest, concern for PE, not on anticoagulation PTA.    Goal of Therapy:  Heparin level 0.3-0.7 units/ml Monitor platelets by anticoagulation protocol: Yes   Plan:  Heparin 4000 units IV x 1, and gtt at 1200 units/hr F/u 6 hour heparin level F/u PE workup  Bertis Ruddy, PharmD Clinical Pharmacist ED Pharmacist Phone # [phone] 03/07/2020 10:19 AM

## 2020-03-07 NOTE — Progress Notes (Signed)
Patient transferred to IR via bed report given at bedside

## 2020-03-07 NOTE — Procedures (Signed)
Patient Name: Peytin Taboada  MRN: [id number]  Epilepsy Attending: Lora Havens  Referring Physician/Provider: Dr. Marshell Garfinkel Date: 03/07/2020 Duration: 22.25 minutes  Patient history: 64 year old male presented after cardiac arrest.  EEG to evaluate for seizures.  Level of alertness: Comatose  AEDs during EEG study: Propofol  Technical aspects: This EEG study was done with scalp electrodes positioned according to the 10-20 International system of electrode placement. Electrical activity was acquired at a sampling rate of 500Hz  and reviewed with a high frequency filter of 70Hz  and a low frequency filter of 1Hz . EEG data were recorded continuously and digitally stored.   Description: EEG showed continuous generalized low amplitude 3-6Hz  theta-delta slowing with overriding 15 to 18 Hz, 2-3 uV beta activity distributed symmetrically and diffusely.  Hyperventilation and photic stimulation were not performed.  Abnormality -Continuous slow, generalized -Excessive beta, generalized  IMPRESSION: This study is suggestive of severe to profound diffuse encephalopathy, nonspecific etiology but likely secondary to sedation.  No seizures or epileptiform discharges were seen throughout the recording.    Annalisia Ingber Barbra Sarks

## 2020-03-07 NOTE — Progress Notes (Signed)
Patient transported from IR to 2M11 without incidence.

## 2020-03-07 NOTE — Consult Note (Addendum)
Cardiology Consultation:   Patient ID: Kenneth Sullivan; [id number]; Jan 31, 1956   Admit date: 03/07/2020 Date of Consult: 03/07/2020  Primary Care Provider: Medicine, [name] Primary Cardiologist: No primary care provider on file.  New Primary Electrophysiologist:  None   Patient Profile:   Kenneth Sullivan is a 64 y.o. male with no history of diabetes, hypertension or hyperlipidemia.  He has not had a checkup in years.  He has a history of alcohol and tobacco use.  He is being seen today for the evaluation of cardiac arrest at the request of Dr. Vaughan Browner.  History of Present Illness:   Kenneth Sullivan is currently intubated and sedated.  His wife is in a consultation room and I spoke with her there.  Information was also obtained from staff.  Kenneth Sullivan has not had a checkup in a long time.  He smokes cigarettes and marijuana.  He did crack cocaine in the past, but she says it has been over 30 years.  He drinks 2 or 3- 40 ounce beers a day.  He was recently in his usual state of health except that his right leg was swollen and painful.  There is a bruise on it.  She is not aware of any acute injury, it was just sore.  Today, he was short of breath when he woke up.  He could not catch his breath.  He could not get comfortable.  He did not complain of any chest pain.  He asked her to call EMS.  Wife does not remember him being short of breath when he went to bed last night.  There is no history of any recent cough or cold symptoms, no fevers or chills.  When EMS arrived, he was extremely short of breath and his oxygen saturation was 40% on room air.  He then went into cardiac arrest, asystole.  ROSC took about 20 minutes.  He initially came back in sinus bradycardia, and is in sinus tachycardia now.  There were no shocks.  He required epinephrine.  Currently in the emergency room, he is requiring sedation.  Prior to sedation, he was responding to touch.   Past Medical History:    Diagnosis Date  . Cardiac arrest ([place]) 03/07/2020  . Known health problems: none    No recent medical care, has not had a physical in years    History reviewed. No pertinent surgical history.   Prior to Admission medications   None    Inpatient Medications: Scheduled Meds:  Continuous Infusions:  PRN Meds:   Allergies:   No Known Allergies  Social History:   Social History   Socioeconomic History  . Marital status: Single    Spouse name: Not on file  . Number of children: Not on file  . Years of education: Not on file  . Highest education level: Not on file  Occupational History  . Not on file  Tobacco Use  . Smoking status: Current Every Day Smoker    Packs/day: 1.00    Types: Cigarettes  . Smokeless tobacco: Never Used  Substance and Sexual Activity  . Alcohol use: Yes    Alcohol/week: 21.0 standard drinks    Types: 21 Cans of beer per week    Comment: 2 or 3  -40 ounce beers a day  . Drug use: Yes    Types: Marijuana  . Sexual activity: Not on file  Other Topics Concern  . Not on file  Social History Narrative   Lives with  wife.  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   Social Determinants of Health   Financial Resource Strain:   . Difficulty of Paying Living Expenses:   Food Insecurity:   . Worried About Charity fundraiser in the Last Year:   . Arboriculturist in the Last Year:   Transportation Needs:   . Film/video editor (Medical):   Marland Kitchen Lack of Transportation (Non-Medical):   Physical Activity:   . Days of Exercise per Week:   . Minutes of Exercise per Session:   Stress:   . Feeling of Stress :   Social Connections:   . Frequency of Communication with Friends and Family:   . Frequency of Social Gatherings with Friends and Family:   . Attends Religious Services:   . Active Member of Clubs or Organizations:   . Attends Archivist Meetings:   Marland Kitchen Marital Status:   Intimate Partner Violence:   . Fear of Current or  Ex-Partner:   . Emotionally Abused:   Marland Kitchen Physically Abused:   . Sexually Abused:     Family History:   Family History  Problem Relation Age of Onset  . Heart attack Neg Hx    Family Status:  Family Status  Relation Name Status  . Mother  Deceased  . Father  Deceased  . Neg Hx  (Not Specified)    ROS:  Please see the history of present illness.  All other ROS reviewed and negative.     Physical Exam/Data:   Vitals:   03/07/20 0715 03/07/20 0727 03/07/20 0800 03/07/20 0810  BP: 116/88  109/89   Pulse:   (!) 113   Resp: (!) 22  (!) 33   Temp:   (!) 93.5 F (34.2 C)   SpO2:  98% 100%   Weight:    72.6 kg  Height:       No intake or output data in the 24 hours ending 03/07/20 0827  Last 3 Weights 03/07/2020  Weight (lbs) 160 lb  Weight (kg) 72.576 kg     Body mass index is 20.54 kg/m.   General:  Well nourished, well developed, male in no acute distress HEENT: normal Lymph: no adenopathy Neck: JVD -not elevated Endocrine:  No thryomegaly Vascular: No carotid bruits; 4/4 extremity pulses 2+  Cardiac:  normal S1, S2; RRR; no murmur Lungs: Few Rales bilaterally, no wheezing or rhonchi Abd: soft, nontender, no hepatomegaly  Ext: no edema Musculoskeletal:  No deformities, BUE and BLE strength not tested Skin: warm and dry  Neuro:  CNs 2-12 intact, no focal abnormalities noted Psych:  Normal affect   EKG:  The EKG was personally reviewed and demonstrates: Sinus tachycardia, heart rate 109, right bundle of unknown chronicity Telemetry:  Telemetry was personally reviewed and demonstrates: Sinus tach   CV studies:   ECHO:  CATH:    Laboratory Data:   Chemistry Recent Labs  Lab 03/07/20 0710  NA 138  K 3.8  CL 104  CO2 14*  GLUCOSE 291*  BUN 7*  CREATININE 1.19  CALCIUM 8.0*  GFRNONAA >60  GFRAA >60  ANIONGAP 20*    Lab Results  Component Value Date   ALT 29 03/07/2020   AST 60 (H) 03/07/2020   ALKPHOS 55 03/07/2020   BILITOT 0.6 03/07/2020     Hematology Recent Labs  Lab 03/07/20 0710  WBC 7.1  RBC 3.66*  HGB 12.0*  HCT 40.9  MCV 111.7*  MCH 32.8  MCHC 29.3*  RDW 15.9*  PLT 100*   Cardiac Enzymes High Sensitivity Troponin:   Recent Labs  Lab 03/07/20 0710  TROPONINIHS 73*      BNPNo results for input(s): BNP, PROBNP in the last 168 hours.  DDimer No results for input(s): DDIMER in the last 168 hours. TSH: No results found for: TSH Lipids:No results found for: CHOL, HDL, LDLCALC, LDLDIRECT, TRIG, CHOLHDL HgbA1c:No results found for: HGBA1C Magnesium: No results found for: MG   Radiology/Studies:  DG Chest Portable 1 View  Result Date: 03/07/2020 CLINICAL DATA:  Intubation.  Respiratory distress EXAM: PORTABLE CHEST 1 VIEW COMPARISON:  None. FINDINGS: Endotracheal tube tip is just below the clavicular heads. Normal heart size and mediastinal contours. There is no edema, consolidation, effusion, or pneumothorax. IMPRESSION: 1. Unremarkable endotracheal tube positioning. 2. No visible cardiopulmonary disease. Electronically Signed   By: Monte Fantasia M.D.   On: 03/07/2020 07:11    Assessment and Plan:   1.  Cardiac arrest: -He was extremely hypoxic upon EMS arrival, but his chest x-ray does not show any significant abnormalities. -His wife reports that he was having left thigh pain and swelling for about a week -CT scans are pending -There are no reports of chest pain -His troponin is mildly elevated, consistent with CPR -Echocardiogram will be ordered and we will follow up on the results -He is being cooled -Further evaluation and treatment per MD  Otherwise, per CCM Principal Problem:   Cardiac arrest [facility])     For questions or updates, please contact [place] Please consult [email or web address] for contact info under Cardiology/STEMI.   Signed, Rosaria Ferries, PA-C  03/07/2020 8:27 AM  History and all data above reviewed.  Patient examined.  I agree with the findings as above.  The patient  is currently intubated and sedated.  I was able to talk with his partner who is with him at home.  She said is not had any cardiac complaints.  He walks around the house.  He does not have any significant limitations.  There was some pain in his leg that was unexplained.  She does not think it was any trauma.  This was his only recent complaint.  He suddenly developed increased dyspnea.  EMS was called with events that unfolded as above.  Prior to this he is not been having any chest pressure, neck or arm discomfort.  He not been complaining of any shortness of breath and had no cough fevers or chills.  He not had any PND orthopnea.  Original issue seem to be more of a primary respiratory event with followed by asystole requiring resuscitation.  I have reviewed personally his echo images.  These are poor acoustic windows.  RV function seems to be severely depressed been dilated.  LV function appears to be well preserved.  The patient exam reveals the patient is intubated and sedated.  He is moving extremities intermittently.   COR: Regular rhythm, no murmurs,  Lungs: Clear to auscultation bilaterally,  Abd: Positive bowel sounds no rebound or guarding, Ext no edema.  All available labs, radiology testing, previous records reviewed. Agree with documented assessment and plan.  Respiratory arrest: The patient had an episode of acute hypoxemia followed by asystole.  Has been resuscitated as above.  Initial cardiac enzymes are minimally elevated as expected.  EKG demonstrates no acute ST segment changes.  Echocardiogram would not suggest pulmonary embolism as an etiology.  CT is pending thrombolytics would likely be administered if this is clearly  demonstrated.  Patient is being cooled and supportive management per CCM.  Other findings include an abnormal urinalysis with bacteria positive for glucose and protein.  He likely has undiagnosed diabetes. [facility]  10:35 AM  03/07/2020

## 2020-03-07 NOTE — Sedation Documentation (Signed)
Right pulmonary artery pressure 46/30 MAP 35

## 2020-03-07 NOTE — Progress Notes (Signed)
eLink Physician-Brief Progress Note Patient Name: Kenneth Sullivan DOB: 29-Aug-1956 MRN: [id number]   Date of Service  03/07/2020  HPI/Events of Note  Bedside RN requesting type and screen order  eICU Interventions  Pt already has a type and screen order in the chart.        Frederik Pear 03/07/2020, 8:33 PM

## 2020-03-07 NOTE — ED Notes (Signed)
Pt had large BM during foley insertion, during cleaning pt started to open his eyes and lift up his legs.

## 2020-03-07 NOTE — Progress Notes (Signed)
Pt having lines placed will try back later

## 2020-03-07 NOTE — H&P (View-Only) (Signed)
Referring Provider: Dr. Vaughan Browner Primary Care Physician:  Medicine, [name] Gastroenterologist:  Althia Forts  Reason for Consultation:  GI bleed  HPI: Kenneth Sullivan is a 64 y.o. male who presented with a cardiac arrest and found to have bilateral PEs and was started on IV Heparin who started having maroon-colored stools and bloody drainage from his OG tube. No report of GI bleeding prior to admission. History of alcohol abuse. Currently intubated, sedated, on pressors. No known history of GI bleeding. Spoke with niece by phone. Hgb 12. Platelets 100. INR pending.  Past Medical History:  Diagnosis Date  . Cardiac arrest ([place]) 03/07/2020  . Known health problems: none    No recent medical care, has not had a physical in years    History reviewed. No pertinent surgical history.  Prior to Admission medications   Not on File    Scheduled Meds: . insulin aspart  0-9 Units Subcutaneous Q4H  . [START ON 03/10/2020] pantoprazole  40 mg Intravenous Q12H   Continuous Infusions: . heparin Stopped (03/07/20 1159)  . lactated ringers 100 mL/hr at 03/07/20 1200  . pantoprozole (PROTONIX) infusion    . pantoprazole (PROTONIX) IVPB    . propofol (DIPRIVAN) infusion 15 mcg/kg/min (03/07/20 0851)   PRN Meds:.docusate sodium, fentaNYL (SUBLIMAZE) injection, fentaNYL (SUBLIMAZE) injection  Allergies as of 03/07/2020  . (No Known Allergies)    Family History  Problem Relation Age of Onset  . Heart attack Neg Hx     Social History   Socioeconomic History  . Marital status: Single    Spouse name: Not on file  . Number of children: Not on file  . Years of education: Not on file  . Highest education level: Not on file  Occupational History  . Not on file  Tobacco Use  . Smoking status: Current Every Day Smoker    Packs/day: 1.00    Types: Cigarettes  . Smokeless tobacco: Never Used  Substance and Sexual Activity  . Alcohol use: Yes    Alcohol/week: 21.0 standard  drinks    Types: 21 Cans of beer per week    Comment: 2 or 3  -40 ounce beers a day  . Drug use: Yes    Types: Marijuana  . Sexual activity: Not on file  Other Topics Concern  . Not on file  Social History Narrative   Lives with wife.  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   Social Determinants of Health   Financial Resource Strain:   . Difficulty of Paying Living Expenses:   Food Insecurity:   . Worried About Charity fundraiser in the Last Year:   . Arboriculturist in the Last Year:   Transportation Needs:   . Film/video editor (Medical):   Marland Kitchen Lack of Transportation (Non-Medical):   Physical Activity:   . Days of Exercise per Week:   . Minutes of Exercise per Session:   Stress:   . Feeling of Stress :   Social Connections:   . Frequency of Communication with Friends and Family:   . Frequency of Social Gatherings with Friends and Family:   . Attends Religious Services:   . Active Member of Clubs or Organizations:   . Attends Archivist Meetings:   Marland Kitchen Marital Status:   Intimate Partner Violence:   . Fear of Current or Ex-Partner:   . Emotionally Abused:   Marland Kitchen Physically Abused:   . Sexually Abused:     Review  of Systems: All negative except as stated above in HPI.  Physical Exam: Vital signs: Vitals:   03/07/20 1159 03/07/20 1200  BP: (!) 89/76 (!) 89/76  Pulse:    Resp: 20 20  Temp:    SpO2:    P 115, T 93   General:   Intubated, sedated, thin, elderly, +acute distress  Head: normocephalic, atraumatic Eyes: anicteric sclera ENT: oropharynx clear Neck: supple, nontender Lungs:  Clear throughout to auscultation.   No wheezes, crackles, or rhonchi. No acute distress. Heart:  Regular rate and rhythm; no murmurs, clicks, rubs,  or gallops. Abdomen: distended, nontender (no facial grimace), +BS  Rectal:  Deferred Ext: no edema  GI:  Lab Results: Recent Labs    03/07/20 0710 03/07/20 1144  WBC 7.1  --   HGB 12.0* 13.9  HCT 40.9 41.0   PLT 100*  --    BMET Recent Labs    03/07/20 0710 03/07/20 1144  NA 138 131*  K 3.8 4.0  CL 104  --   CO2 14*  --   GLUCOSE 291*  --   BUN 7*  --   CREATININE 1.19  --   CALCIUM 8.0*  --    LFT Recent Labs    03/07/20 0710  PROT 4.4*  ALBUMIN 1.9*  AST 60*  ALT 29  ALKPHOS 55  BILITOT 0.6   PT/INR No results for input(s): LABPROT, INR in the last 72 hours.   Studies/Results: CT Head Wo Contrast  Result Date: 03/07/2020 CLINICAL DATA:  64 year old male with history of altered mental status. EXAM: CT HEAD WITHOUT CONTRAST TECHNIQUE: Contiguous axial images were obtained from the base of the skull through the vertex without intravenous contrast. COMPARISON:  No priors. FINDINGS: Brain: Patchy areas of decreased attenuation are noted throughout the deep and periventricular white matter of the cerebral hemispheres bilaterally, compatible with chronic microvascular ischemic disease. no evidence of acute infarction, hemorrhage, hydrocephalus, extra-axial collection or mass lesion/mass effect. Vascular: No hyperdense vessel or unexpected calcification. Skull: Normal. Negative for fracture or focal lesion. Sinuses/Orbits: No acute finding. Other: Endotracheal and nasogastric tubes are incompletely imaged. IMPRESSION: 1. No acute intracranial abnormalities. 2. Mild chronic microvascular ischemic changes in the cerebral white matter, as above. Electronically Signed   By: Vinnie Langton M.D.   On: 03/07/2020 10:56   CT ANGIO CHEST PE W OR WO CONTRAST  Result Date: 03/07/2020 CLINICAL DATA:  Cardiac arrest, intubated EXAM: CT ANGIOGRAPHY CHEST WITH CONTRAST TECHNIQUE: Multidetector CT imaging of the chest was performed using the standard protocol during bolus administration of intravenous contrast. Multiplanar CT image reconstructions and MIPs were obtained to evaluate the vascular anatomy. CONTRAST:  80 cc OMNIPAQUE IOHEXOL 350 MG/ML SOLN COMPARISON:  03/07/2020 FINDINGS:  Cardiovascular: This is a technically adequate evaluation of the pulmonary vasculature. There are bilateral pulmonary emboli right greater than left. There is straightening of the interventricular septum and dilatation of the right ventricle compatible with right heart strain. 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 The thoracic aorta demonstrates normal caliber with no aneurysm or dissection. No pericardial effusion. Mediastinum/Nodes: No enlarged mediastinal, hilar, or axillary lymph nodes. Thyroid gland, trachea, and esophagus demonstrate no significant findings. Endotracheal tube identified well above carina. Enteric catheter extends into the gastric lumen. Lungs/Pleura: Scattered upper lobe predominant emphysematous changes are seen. Patchy subpleural consolidation superior segment right lower lobe is nonspecific. No effusion or pneumothorax. Mild lower lobe predominant bronchiectasis. The central airways are  patent. Upper Abdomen: Rounded cystic structure left upper quadrant likely partially visualized renal cyst. This measures up to 7.3 cm in diameter. Musculoskeletal: No acute or destructive bony lesions. Reconstructed images demonstrate no additional findings. Review of the MIP images confirms the above findings. IMPRESSION: 1. Bilateral pulmonary emboli. Positive for acute PE with CT evidence of right heart strain (RV/LV Ratio = 1.9) consistent with at least submassive (intermediate risk) PE. The presence of right heart strain has been associated with an increased risk of morbidity and mortality. 2. Emphysema.  Mild bronchiectasis bilaterally. 3. Support devices as above. 4. 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 These results were called by telephone at the time of interpretation on 03/07/2020 at 11:16 a.m. to provider [facility] , who verbally acknowledged these  results. Electronically Signed   By: Randa Ngo M.D.   On: 03/07/2020 11:20   DG Chest Portable 1 View  Result Date: 03/07/2020 CLINICAL DATA:  Intubation.  Respiratory distress EXAM: PORTABLE CHEST 1 VIEW COMPARISON:  None. FINDINGS: Endotracheal tube tip is just below the clavicular heads. Normal heart size and mediastinal contours. There is no edema, consolidation, effusion, or pneumothorax. IMPRESSION: 1. Unremarkable endotracheal tube positioning. 2. No visible cardiopulmonary disease. Electronically Signed   By: Monte Fantasia M.D.   On: 03/07/2020 07:11   ECHOCARDIOGRAM COMPLETE  Result Date: 03/07/2020    ECHOCARDIOGRAM REPORT   Patient Name:   Kenneth Sullivan Date of Exam: 03/07/2020 Medical Rec #:  [id number]    Height:       74.0 in Accession #:    [id number]   Weight:       160.0 lb Date of Birth:  [birth year]    BSA:          1.977 m Patient Age:    80 years     BP:           109/89 mmHg Patient Gender: M            HR:           116 bpm. Exam Location:  Inpatient Procedure: 2D Echo, Cardiac Doppler and Color Doppler Indications:    Cardiac arrest I46.9  History:        Patient has no prior history of Echocardiogram examinations.                 Risk Factors:Current Smoker.  Sonographer:    Vickie Epley RDCS Referring Phys: [id number] [facility]  Sonographer Comments: Echo performed with patient supine and on artificial respirator and Technically challenging study due to limited acoustic windows. Image acquisition challenging due to respiratory motion. IMPRESSIONS  1. Very difficult study with poor windows. The RV appears severely dilated with severely reduced function. D shaped septum. Overall, concerns for pulmonary embolism in this patient with cardiac arrest. Clinical correlation is recommended.  2. Left ventricular ejection fraction, by estimation, is 60 to 65%. The left ventricle has normal function. Left ventricular endocardial border not optimally defined to evaluate regional wall motion.  Left ventricular diastolic function could not be evaluated. There is the interventricular septum is flattened in systole and diastole, consistent with right ventricular pressure and volume overload.  3. Right ventricular systolic function is severely reduced. The right ventricular size is severely enlarged.  4. The mitral valve is grossly normal. No evidence of mitral valve regurgitation. No evidence of mitral stenosis.  5. The aortic valve is grossly normal. Aortic valve regurgitation is not  visualized. No aortic stenosis is present.  6. Right atrial size was mildly dilated. FINDINGS  Left Ventricle: Left ventricular ejection fraction, by estimation, is 60 to 65%. The left ventricle has normal function. Left ventricular endocardial border not optimally defined to evaluate regional wall motion. The left ventricular internal cavity size was small. There is no left ventricular hypertrophy. The interventricular septum is flattened in systole and diastole, consistent with right ventricular pressure and volume overload. Left ventricular diastolic function could not be evaluated due to nondiagnostic images. Left ventricular diastolic function could not be evaluated. Right Ventricle: The right ventricular size is severely enlarged. No increase in right ventricular wall thickness. Right ventricular systolic function is severely reduced. Left Atrium: Left atrial size was normal in size. Right Atrium: Right atrial size was mildly dilated. Pericardium: There is no evidence of pericardial effusion. Mitral Valve: The mitral valve is grossly normal. No evidence of mitral valve regurgitation. No evidence of mitral valve stenosis. Tricuspid Valve: The tricuspid valve is grossly normal. Tricuspid valve regurgitation is mild . No evidence of tricuspid stenosis. Aortic Valve: The aortic valve is grossly normal. Aortic valve regurgitation is not visualized. No aortic stenosis is present. Pulmonic Valve: The pulmonic valve was grossly  normal. Pulmonic valve regurgitation is not visualized. No evidence of pulmonic stenosis. Aorta: The aortic root is normal in size and structure. Venous: IVC assessment for right atrial pressure unable to be performed due to mechanical ventilation. IAS/Shunts: No atrial level shunt detected by color flow Doppler. Additional Comments: Very difficult study with poor windows. The RV appears severely dilated with severely reduced function. D shaped septum. Overall, concerns for pulmonary embolism in this patient with cardiac arrest. Clinical correlation is recommended.  LEFT VENTRICLE PLAX 2D LVOT diam:     2.00 cm LVOT Area:     3.14 cm  RIGHT VENTRICLE TAPSE (M-mode): 1.1 cm RIGHT ATRIUM          Index RA Area:     7.97 cm RA Volume:   15.10 ml 7.64 ml/m  MV E velocity: 61.70 cm/s  TRICUSPID VALVE                            TR Peak grad:   25.6 mmHg                            TR Vmax:        253.00 cm/s                             SHUNTS                            Systemic Diam: 2.00 cm Eleonore Chiquito MD Electronically signed by Eleonore Chiquito MD Signature Date/Time: 03/07/2020/9:57:03 AM    Final     Impression/Plan: GI bleed following a cardiac arrest with bilateral PEs and need for IV heparin. Question ischemic bowel as source of bleeding vs peptic ulcer disease. On pressors and mechanical ventilation. Bedside EGD. Informed consent obtained from niece. Protonix drip. Supportive care. Heparin held for 2 hours prior to EGD.     LOS: 0 days   Lear Ng  03/07/2020, 12:19 PM  Questions please call [phone]

## 2020-03-07 NOTE — Progress Notes (Signed)
ABG attempted multiple times by RT with doppler with no success. Will make MD aware.

## 2020-03-07 NOTE — Consult Note (Signed)
Chief Complaint: Patient was seen in consultation today for Pulmonary arteriogram with possible pulmonary embolus thrombectomy vs thrombolysis Chief Complaint  Patient presents with  . Cardiac Arrest   at the request of Dr Vaughan Browner  Referring Physician(s): Dr Michail Sermon  Supervising Physician: Arne Cleveland  Patient Status: [facility] - In-pt  History of Present Illness: Kenneth Sullivan is a 64 y.o. male   Hx Etoh and smoker (cigs and marijuana) To ED from home this am via ambulance Resp distress; cardiac arrest: ROSC 20 min CPR/ epi x 3 Has complained of rt leg pain recently  Echo:1. Very difficult study with poor windows. The RV appears severely dilated with severely reduced function. D shaped septum. Overall, concerns for pulmonary embolism in this patient with cardiac arrest. Clinical correlation is recommended.  CT Chest: IMPRESSION: 1. Bilateral pulmonary emboli. Positive for acute PE with CT evidence of right heart strain (RV/LV Ratio = 1.9) consistent with at least submassive (intermediate risk) PE. The presence of right heart strain has been associated with an increased risk of morbidity and mortality. 2. Emphysema.  Mild bronchiectasis bilaterally. 3. Support devices as above. 4. 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 Cardiac arrest ([place]) 03/07/2020  . Known health problems: none    No recent medical care, has not had a  physical in years    History reviewed. No pertinent surgical history.  Allergies: Patient has no known allergies.  Medications: Prior to Admission medications   Not on File     Family History  Problem Relation Age of Onset  . Heart attack Neg Hx     Social History   Socioeconomic History  . Marital status: Single    Spouse name: Not on file  . Number of children: Not on file  . Years of education: Not on file  . Highest education level: Not on file  Occupational History  . Not on file  Tobacco Use  . Smoking status: Current Every Day Smoker    Packs/day: 1.00    Types: Cigarettes  . Smokeless tobacco: Never Used  Substance and Sexual Activity  . Alcohol use: Yes    Alcohol/week: 21.0 standard drinks    Types: 21 Cans of beer per week    Comment: 2 or 3  -40 ounce beers a day  . Drug use: Yes    Types: Marijuana  . Sexual activity: Not on file  Other Topics Concern  . Not on file  Social History Narrative   Lives with wife.  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   Social Determinants of Health   Financial Resource Strain:   . Difficulty of Paying Living Expenses:   Food Insecurity:   . Worried About Charity fundraiser in the Last Year:   . Arboriculturist in the Last Year:   Transportation Needs:   . Film/video editor (Medical):   Marland Kitchen Lack of Transportation (Non-Medical):   Physical Activity:   . Days of  Exercise per Week:   . Minutes of Exercise per Session:   Stress:   . Feeling of Stress :   Social Connections:   . Frequency of Communication with Friends and Family:   . Frequency of Social Gatherings with Friends and Family:   . Attends Religious Services:   . Active Member of Clubs or Organizations:   . Attends Archivist Meetings:   Marland Kitchen Marital Status:     Review of Systems: A 12 point ROS discussed and pertinent positives are indicated in the HPI above.  All other systems are negative.   Vital Signs: BP (!) 80/64    Pulse (!) 117   Temp (!) 93.2 F (34 C)   Resp (!) 22   Ht 6\' 2"  (1.88 m)   Wt 160 lb (72.6 kg)   SpO2 90%   BMI 20.54 kg/m   Physical Exam Vitals reviewed.  Constitutional:      General: He is in acute distress.  Cardiovascular:     Rate and Rhythm: Regular rhythm.     Heart sounds: Normal heart sounds.  Pulmonary:     Comments: On vent Musculoskeletal:     Comments: sedated  Skin:    General: Skin is warm and dry.  Neurological:     Comments: Sedated/on vent No response  Psychiatric:     Comments: I spoke to Niece Kenneth Sullivan via phone Consents to procedure     Imaging: CT Head Wo Contrast  Result Date: 03/07/2020 CLINICAL DATA:  64 year old male with history of altered mental status. EXAM: CT HEAD WITHOUT CONTRAST TECHNIQUE: Contiguous axial images were obtained from the base of the skull through the vertex without intravenous contrast. COMPARISON:  No priors. FINDINGS: Brain: Patchy areas of decreased attenuation are noted throughout the deep and periventricular white matter of the cerebral hemispheres bilaterally, compatible with chronic microvascular ischemic disease. no evidence of acute infarction, hemorrhage, hydrocephalus, extra-axial collection or mass lesion/mass effect. Vascular: No hyperdense vessel or unexpected calcification. Skull: Normal. Negative for fracture or focal lesion. Sinuses/Orbits: No acute finding. Other: Endotracheal and nasogastric tubes are incompletely imaged. IMPRESSION: 1. No acute intracranial abnormalities. 2. Mild chronic microvascular ischemic changes in the cerebral white matter, as above. Electronically Signed   By: Vinnie Langton M.D.   On: 03/07/2020 10:56   CT ANGIO CHEST PE W OR WO CONTRAST  Result Date: 03/07/2020 CLINICAL DATA:  Cardiac arrest, intubated EXAM: CT ANGIOGRAPHY CHEST WITH CONTRAST TECHNIQUE: Multidetector CT imaging of the chest was performed using the standard protocol during bolus administration of intravenous  contrast. Multiplanar CT image reconstructions and MIPs were obtained to evaluate the vascular anatomy. CONTRAST:  80 cc OMNIPAQUE IOHEXOL 350 MG/ML SOLN COMPARISON:  03/07/2020 FINDINGS: Cardiovascular: This is a technically adequate evaluation of the pulmonary vasculature. There are bilateral pulmonary emboli right greater than left. There is straightening of the interventricular septum and dilatation of the right ventricle compatible with right heart strain. 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 The thoracic aorta demonstrates normal caliber with no aneurysm or dissection. No pericardial effusion. Mediastinum/Nodes: No enlarged mediastinal, hilar, or axillary lymph nodes. Thyroid gland, trachea, and esophagus demonstrate no significant findings. Endotracheal tube identified well above carina. Enteric catheter extends into the gastric lumen. Lungs/Pleura: Scattered upper lobe predominant emphysematous changes are seen. Patchy subpleural consolidation superior segment right lower lobe is nonspecific. No effusion or pneumothorax. Mild lower lobe predominant bronchiectasis. The central airways are patent. Upper  Abdomen: Rounded cystic structure left upper quadrant likely partially visualized renal cyst. This measures up to 7.3 cm in diameter. Musculoskeletal: No acute or destructive bony lesions. Reconstructed images demonstrate no additional findings. Review of the MIP images confirms the above findings. IMPRESSION: 1. Bilateral pulmonary emboli. Positive for acute PE with CT evidence of right heart strain (RV/LV Ratio = 1.9) consistent with at least submassive (intermediate risk) PE. The presence of right heart strain has been associated with an increased risk of morbidity and mortality. 2. Emphysema.  Mild bronchiectasis bilaterally. 3. Support devices as above. 4. 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 These results were called by telephone at the time of interpretation on 03/07/2020 at 11:16 a.m. to provider [facility] , who verbally acknowledged these results. Electronically Signed   By: Randa Ngo M.D.   On: 03/07/2020 11:20   DG Chest Port 1 View  Result Date: 03/07/2020 CLINICAL DATA:  Central line placement EXAM: PORTABLE CHEST 1 VIEW COMPARISON:  03/07/2020 at 0654 hours FINDINGS: Interval placement of a left subclavian approach central venous catheter with distal tip terminating at the level of the distal SVC. ET tube terminates 2.5 cm superior to the carina. Enteric tube courses below the diaphragm with distal tip and side hole beyond the inferior margin of the film. Normal heart size. No focal airspace consolidation, pleural effusion, or pneumothorax. IMPRESSION: Interval placement of a left subclavian approach central venous catheter with distal tip terminating at the level of the distal SVC. No pneumothorax. Electronically Signed   By: Davina Poke D.O.   On: 03/07/2020 13:19   DG Chest Portable 1 View  Result Date: 03/07/2020 CLINICAL DATA:  Intubation.  Respiratory distress EXAM: PORTABLE CHEST 1 VIEW COMPARISON:  None. FINDINGS: Endotracheal tube tip is just below the clavicular heads. Normal heart size and mediastinal contours. There is no edema, consolidation, effusion, or pneumothorax. IMPRESSION: 1. Unremarkable endotracheal tube positioning. 2. No visible cardiopulmonary disease. Electronically Signed   By: Monte Fantasia M.D.   On: 03/07/2020 07:11   ECHOCARDIOGRAM COMPLETE  Result Date: 03/07/2020    ECHOCARDIOGRAM REPORT   Patient Name:   STONE WATRING Date of Exam: 03/07/2020 Medical Rec #:  [id number]    Height:       74.0 in Accession #:    [id number]   Weight:       160.0 lb Date of Birth:  1956-02-03    BSA:          1.977 m Patient Age:    61 years     BP:           109/89 mmHg Patient Gender: M            HR:           116 bpm. Exam  Location:  Inpatient Procedure: 2D Echo, Cardiac Doppler and Color Doppler Indications:    Cardiac arrest I46.9  History:        Patient has no prior history of Echocardiogram examinations.                 Risk Factors:Current Smoker.  Sonographer:    Vickie Epley RDCS Referring Phys: [id number] [facility]  Sonographer Comments: Echo performed with patient supine and on artificial respirator and Technically challenging study due to limited acoustic windows. Image acquisition challenging due to respiratory motion. IMPRESSIONS  1. Very difficult study with poor windows. The RV appears severely dilated with severely  reduced function. D shaped septum. Overall, concerns for pulmonary embolism in this patient with cardiac arrest. Clinical correlation is recommended.  2. Left ventricular ejection fraction, by estimation, is 60 to 65%. The left ventricle has normal function. Left ventricular endocardial border not optimally defined to evaluate regional wall motion. Left ventricular diastolic function could not be evaluated. There is the interventricular septum is flattened in systole and diastole, consistent with right ventricular pressure and volume overload.  3. Right ventricular systolic function is severely reduced. The right ventricular size is severely enlarged.  4. The mitral valve is grossly normal. No evidence of mitral valve regurgitation. No evidence of mitral stenosis.  5. The aortic valve is grossly normal. Aortic valve regurgitation is not visualized. No aortic stenosis is present.  6. Right atrial size was mildly dilated. FINDINGS  Left Ventricle: Left ventricular ejection fraction, by estimation, is 60 to 65%. The left ventricle has normal function. Left ventricular endocardial border not optimally defined to evaluate regional wall motion. The left ventricular internal cavity size was small. There is no left ventricular hypertrophy. The interventricular septum is flattened in systole and diastole, consistent  with right ventricular pressure and volume overload. Left ventricular diastolic function could not be evaluated due to nondiagnostic images. Left ventricular diastolic function could not be evaluated. Right Ventricle: The right ventricular size is severely enlarged. No increase in right ventricular wall thickness. Right ventricular systolic function is severely reduced. Left Atrium: Left atrial size was normal in size. Right Atrium: Right atrial size was mildly dilated. Pericardium: There is no evidence of pericardial effusion. Mitral Valve: The mitral valve is grossly normal. No evidence of mitral valve regurgitation. No evidence of mitral valve stenosis. Tricuspid Valve: The tricuspid valve is grossly normal. Tricuspid valve regurgitation is mild . No evidence of tricuspid stenosis. Aortic Valve: The aortic valve is grossly normal. Aortic valve regurgitation is not visualized. No aortic stenosis is present. Pulmonic Valve: The pulmonic valve was grossly normal. Pulmonic valve regurgitation is not visualized. No evidence of pulmonic stenosis. Aorta: The aortic root is normal in size and structure. Venous: IVC assessment for right atrial pressure unable to be performed due to mechanical ventilation. IAS/Shunts: No atrial level shunt detected by color flow Doppler. Additional Comments: Very difficult study with poor windows. The RV appears severely dilated with severely reduced function. D shaped septum. Overall, concerns for pulmonary embolism in this patient with cardiac arrest. Clinical correlation is recommended.  LEFT VENTRICLE PLAX 2D LVOT diam:     2.00 cm LVOT Area:     3.14 cm  RIGHT VENTRICLE TAPSE (M-mode): 1.1 cm RIGHT ATRIUM          Index RA Area:     7.97 cm RA Volume:   15.10 ml 7.64 ml/m  MV E velocity: 61.70 cm/s  TRICUSPID VALVE                            TR Peak grad:   25.6 mmHg                            TR Vmax:        253.00 cm/s                             SHUNTS  Systemic Diam: 2.00 cm Eleonore Chiquito MD Electronically signed by Eleonore Chiquito MD Signature Date/Time: 03/07/2020/9:57:03 AM    Final     Labs:  CBC: Recent Labs    03/07/20 0710 03/07/20 1144  WBC 7.1  --   HGB 12.0* 13.9  HCT 40.9 41.0  PLT 100*  --     COAGS: No results for input(s): INR, APTT in the last 8760 hours.  BMP: Recent Labs    03/07/20 0710 03/07/20 1144  NA 138 131*  K 3.8 4.0  CL 104  --   CO2 14*  --   GLUCOSE 291*  --   BUN 7*  --   CALCIUM 8.0*  --   CREATININE 1.19  --   GFRNONAA >60  --   GFRAA >60  --     LIVER FUNCTION TESTS: Recent Labs    03/07/20 0710  BILITOT 0.6  AST 60*  ALT 29  ALKPHOS 55  PROT 4.4*  ALBUMIN 1.9*    TUMOR MARKERS: No results for input(s): AFPTM, CEA, CA199, CHROMGRNA in the last 8760 hours.  Assessment and Plan:  Resp distress; Cardiac arrest- CPR 20 min/ Epi x 3 Vent now- no response +PE and heart strain CTA and Echo Stopped Heparin drip secondary maroon stools and blood in NG Scheduled for Pulmonary arteriogram with possible Pulmonary embolus thrombectomy vs thrombolysis Risks and benefits discussed with the patient's niece Kenneth Sullivan including but not limited to vascular injury, stroke, contrast induced renal failure, limb loss and infection. All her questions were answered, she is agreeable to proceed. Consent signed and in chart.   Thank you for this interesting consult.  I greatly enjoyed meeting Erikson Wragg and look forward to participating in their care.  A copy of this report was sent to the requesting provider on this date.  Electronically Signed: Lavonia Drafts, PA-C 03/07/2020, 1:25 PM   I spent a total of 40 Minutes    in face to face in clinical consultation, greater than 50% of which was counseling/coordinating care for Pulmonary arteriogram with poss thrombectomy vs thrombolysis

## 2020-03-07 NOTE — Interval H&P Note (Signed)
History and Physical Interval Note:  03/07/2020 2:58 PM  Kenneth Sullivan  has presented today for surgery, with the diagnosis of GI Bleed.  The various methods of treatment have been discussed with the patient and family. After consideration of risks, benefits and other options for treatment, the patient has consented to  Procedure(s): ESOPHAGOGASTRODUODENOSCOPY (EGD) WITH PROPOFOL (N/A) as a surgical intervention.  The patient's history has been reviewed, patient examined, no change in status, stable for surgery.  I have reviewed the patient's chart and labs.  Questions were answered to the patient's satisfaction.     Lear Ng

## 2020-03-07 NOTE — Progress Notes (Signed)
  Echocardiogram 2D Echocardiogram has been performed.  Michiel Cowboy 03/07/2020, 9:21 AM

## 2020-03-07 NOTE — Consult Note (Signed)
Referring Provider: Dr. Vaughan Browner Primary Care Physician:  Medicine, [name] Primary Gastroenterologist:  Althia Forts  Reason for Consultation:  GI bleed  HPI: Kenneth Sullivan is a 64 y.o. male who presented with a cardiac arrest and found to have bilateral PEs and was started on IV Heparin who started having maroon-colored stools and bloody drainage from his OG tube. No report of GI bleeding prior to admission. History of alcohol abuse. Currently intubated, sedated, on pressors. No known history of GI bleeding. Spoke with niece by phone. Hgb 12. Platelets 100. INR pending.  Past Medical History:  Diagnosis Date  . Cardiac arrest ([place]) 03/07/2020  . Known health problems: none    No recent medical care, has not had a physical in years    History reviewed. No pertinent surgical history.  Prior to Admission medications   Not on File    Scheduled Meds: . insulin aspart  0-9 Units Subcutaneous Q4H  . [START ON 03/10/2020] pantoprazole  40 mg Intravenous Q12H   Continuous Infusions: . heparin Stopped (03/07/20 1159)  . lactated ringers 100 mL/hr at 03/07/20 1200  . pantoprozole (PROTONIX) infusion    . pantoprazole (PROTONIX) IVPB    . propofol (DIPRIVAN) infusion 15 mcg/kg/min (03/07/20 0851)   PRN Meds:.docusate sodium, fentaNYL (SUBLIMAZE) injection, fentaNYL (SUBLIMAZE) injection  Allergies as of 03/07/2020  . (No Known Allergies)    Family History  Problem Relation Age of Onset  . Heart attack Neg Hx     Social History   Socioeconomic History  . Marital status: Single    Spouse name: Not on file  . Number of children: Not on file  . Years of education: Not on file  . Highest education level: Not on file  Occupational History  . Not on file  Tobacco Use  . Smoking status: Current Every Day Smoker    Packs/day: 1.00    Types: Cigarettes  . Smokeless tobacco: Never Used  Substance and Sexual Activity  . Alcohol use: Yes    Alcohol/week: 21.0 standard  drinks    Types: 21 Cans of beer per week    Comment: 2 or 3  -40 ounce beers a day  . Drug use: Yes    Types: Marijuana  . Sexual activity: Not on file  Other Topics Concern  . Not on file  Social History Narrative   Lives with wife.  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   Social Determinants of Health   Financial Resource Strain:   . Difficulty of Paying Living Expenses:   Food Insecurity:   . Worried About Charity fundraiser in the Last Year:   . Arboriculturist in the Last Year:   Transportation Needs:   . Film/video editor (Medical):   Marland Kitchen Lack of Transportation (Non-Medical):   Physical Activity:   . Days of Exercise per Week:   . Minutes of Exercise per Session:   Stress:   . Feeling of Stress :   Social Connections:   . Frequency of Communication with Friends and Family:   . Frequency of Social Gatherings with Friends and Family:   . Attends Religious Services:   . Active Member of Clubs or Organizations:   . Attends Archivist Meetings:   Marland Kitchen Marital Status:   Intimate Partner Violence:   . Fear of Current or Ex-Partner:   . Emotionally Abused:   Marland Kitchen Physically Abused:   . Sexually Abused:     Review  of Systems: All negative except as stated above in HPI.  Physical Exam: Vital signs: Vitals:   03/07/20 1159 03/07/20 1200  BP: (!) 89/76 (!) 89/76  Pulse:    Resp: 20 20  Temp:    SpO2:    P 115, T 93   General:   Intubated, sedated, thin, elderly, +acute distress  Head: normocephalic, atraumatic Eyes: anicteric sclera ENT: oropharynx clear Neck: supple, nontender Lungs:  Clear throughout to auscultation.   No wheezes, crackles, or rhonchi. No acute distress. Heart:  Regular rate and rhythm; no murmurs, clicks, rubs,  or gallops. Abdomen: distended, nontender (no facial grimace), +BS  Rectal:  Deferred Ext: no edema  GI:  Lab Results: Recent Labs    03/07/20 0710 03/07/20 1144  WBC 7.1  --   HGB 12.0* 13.9  HCT 40.9 41.0   PLT 100*  --    BMET Recent Labs    03/07/20 0710 03/07/20 1144  NA 138 131*  K 3.8 4.0  CL 104  --   CO2 14*  --   GLUCOSE 291*  --   BUN 7*  --   CREATININE 1.19  --   CALCIUM 8.0*  --    LFT Recent Labs    03/07/20 0710  PROT 4.4*  ALBUMIN 1.9*  AST 60*  ALT 29  ALKPHOS 55  BILITOT 0.6   PT/INR No results for input(s): LABPROT, INR in the last 72 hours.   Studies/Results: CT Head Wo Contrast  Result Date: 03/07/2020 CLINICAL DATA:  64 year old male with history of altered mental status. EXAM: CT HEAD WITHOUT CONTRAST TECHNIQUE: Contiguous axial images were obtained from the base of the skull through the vertex without intravenous contrast. COMPARISON:  No priors. FINDINGS: Brain: Patchy areas of decreased attenuation are noted throughout the deep and periventricular white matter of the cerebral hemispheres bilaterally, compatible with chronic microvascular ischemic disease. no evidence of acute infarction, hemorrhage, hydrocephalus, extra-axial collection or mass lesion/mass effect. Vascular: No hyperdense vessel or unexpected calcification. Skull: Normal. Negative for fracture or focal lesion. Sinuses/Orbits: No acute finding. Other: Endotracheal and nasogastric tubes are incompletely imaged. IMPRESSION: 1. No acute intracranial abnormalities. 2. Mild chronic microvascular ischemic changes in the cerebral white matter, as above. Electronically Signed   By: Vinnie Langton M.D.   On: 03/07/2020 10:56   CT ANGIO CHEST PE W OR WO CONTRAST  Result Date: 03/07/2020 CLINICAL DATA:  Cardiac arrest, intubated EXAM: CT ANGIOGRAPHY CHEST WITH CONTRAST TECHNIQUE: Multidetector CT imaging of the chest was performed using the standard protocol during bolus administration of intravenous contrast. Multiplanar CT image reconstructions and MIPs were obtained to evaluate the vascular anatomy. CONTRAST:  80 cc OMNIPAQUE IOHEXOL 350 MG/ML SOLN COMPARISON:  03/07/2020 FINDINGS:  Cardiovascular: This is a technically adequate evaluation of the pulmonary vasculature. There are bilateral pulmonary emboli right greater than left. There is straightening of the interventricular septum and dilatation of the right ventricle compatible with right heart strain. 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 The thoracic aorta demonstrates normal caliber with no aneurysm or dissection. No pericardial effusion. Mediastinum/Nodes: No enlarged mediastinal, hilar, or axillary lymph nodes. Thyroid gland, trachea, and esophagus demonstrate no significant findings. Endotracheal tube identified well above carina. Enteric catheter extends into the gastric lumen. Lungs/Pleura: Scattered upper lobe predominant emphysematous changes are seen. Patchy subpleural consolidation superior segment right lower lobe is nonspecific. No effusion or pneumothorax. Mild lower lobe predominant bronchiectasis. The central airways are  patent. Upper Abdomen: Rounded cystic structure left upper quadrant likely partially visualized renal cyst. This measures up to 7.3 cm in diameter. Musculoskeletal: No acute or destructive bony lesions. Reconstructed images demonstrate no additional findings. Review of the MIP images confirms the above findings. IMPRESSION: 1. Bilateral pulmonary emboli. Positive for acute PE with CT evidence of right heart strain (RV/LV Ratio = 1.9) consistent with at least submassive (intermediate risk) PE. The presence of right heart strain has been associated with an increased risk of morbidity and mortality. 2. Emphysema.  Mild bronchiectasis bilaterally. 3. Support devices as above. 4. 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 These results were called by telephone at the time of interpretation on 03/07/2020 at 11:16 a.m. to provider [facility] , who verbally acknowledged these  results. Electronically Signed   By: Randa Ngo M.D.   On: 03/07/2020 11:20   DG Chest Portable 1 View  Result Date: 03/07/2020 CLINICAL DATA:  Intubation.  Respiratory distress EXAM: PORTABLE CHEST 1 VIEW COMPARISON:  None. FINDINGS: Endotracheal tube tip is just below the clavicular heads. Normal heart size and mediastinal contours. There is no edema, consolidation, effusion, or pneumothorax. IMPRESSION: 1. Unremarkable endotracheal tube positioning. 2. No visible cardiopulmonary disease. Electronically Signed   By: Monte Fantasia M.D.   On: 03/07/2020 07:11   ECHOCARDIOGRAM COMPLETE  Result Date: 03/07/2020    ECHOCARDIOGRAM REPORT   Patient Name:   ASANI CARDE Date of Exam: 03/07/2020 Medical Rec #:  [id number]    Height:       74.0 in Accession #:    [id number]   Weight:       160.0 lb Date of Birth:  1956-08-30    BSA:          1.977 m Patient Age:    89 years     BP:           109/89 mmHg Patient Gender: M            HR:           116 bpm. Exam Location:  Inpatient Procedure: 2D Echo, Cardiac Doppler and Color Doppler Indications:    Cardiac arrest I46.9  History:        Patient has no prior history of Echocardiogram examinations.                 Risk Factors:Current Smoker.  Sonographer:    Vickie Epley RDCS Referring Phys: [id number] [facility]  Sonographer Comments: Echo performed with patient supine and on artificial respirator and Technically challenging study due to limited acoustic windows. Image acquisition challenging due to respiratory motion. IMPRESSIONS  1. Very difficult study with poor windows. The RV appears severely dilated with severely reduced function. D shaped septum. Overall, concerns for pulmonary embolism in this patient with cardiac arrest. Clinical correlation is recommended.  2. Left ventricular ejection fraction, by estimation, is 60 to 65%. The left ventricle has normal function. Left ventricular endocardial border not optimally defined to evaluate regional wall motion.  Left ventricular diastolic function could not be evaluated. There is the interventricular septum is flattened in systole and diastole, consistent with right ventricular pressure and volume overload.  3. Right ventricular systolic function is severely reduced. The right ventricular size is severely enlarged.  4. The mitral valve is grossly normal. No evidence of mitral valve regurgitation. No evidence of mitral stenosis.  5. The aortic valve is grossly normal. Aortic valve regurgitation is not  visualized. No aortic stenosis is present.  6. Right atrial size was mildly dilated. FINDINGS  Left Ventricle: Left ventricular ejection fraction, by estimation, is 60 to 65%. The left ventricle has normal function. Left ventricular endocardial border not optimally defined to evaluate regional wall motion. The left ventricular internal cavity size was small. There is no left ventricular hypertrophy. The interventricular septum is flattened in systole and diastole, consistent with right ventricular pressure and volume overload. Left ventricular diastolic function could not be evaluated due to nondiagnostic images. Left ventricular diastolic function could not be evaluated. Right Ventricle: The right ventricular size is severely enlarged. No increase in right ventricular wall thickness. Right ventricular systolic function is severely reduced. Left Atrium: Left atrial size was normal in size. Right Atrium: Right atrial size was mildly dilated. Pericardium: There is no evidence of pericardial effusion. Mitral Valve: The mitral valve is grossly normal. No evidence of mitral valve regurgitation. No evidence of mitral valve stenosis. Tricuspid Valve: The tricuspid valve is grossly normal. Tricuspid valve regurgitation is mild . No evidence of tricuspid stenosis. Aortic Valve: The aortic valve is grossly normal. Aortic valve regurgitation is not visualized. No aortic stenosis is present. Pulmonic Valve: The pulmonic valve was grossly  normal. Pulmonic valve regurgitation is not visualized. No evidence of pulmonic stenosis. Aorta: The aortic root is normal in size and structure. Venous: IVC assessment for right atrial pressure unable to be performed due to mechanical ventilation. IAS/Shunts: No atrial level shunt detected by color flow Doppler. Additional Comments: Very difficult study with poor windows. The RV appears severely dilated with severely reduced function. D shaped septum. Overall, concerns for pulmonary embolism in this patient with cardiac arrest. Clinical correlation is recommended.  LEFT VENTRICLE PLAX 2D LVOT diam:     2.00 cm LVOT Area:     3.14 cm  RIGHT VENTRICLE TAPSE (M-mode): 1.1 cm RIGHT ATRIUM          Index RA Area:     7.97 cm RA Volume:   15.10 ml 7.64 ml/m  MV E velocity: 61.70 cm/s  TRICUSPID VALVE                            TR Peak grad:   25.6 mmHg                            TR Vmax:        253.00 cm/s                             SHUNTS                            Systemic Diam: 2.00 cm Eleonore Chiquito MD Electronically signed by Eleonore Chiquito MD Signature Date/Time: 03/07/2020/9:57:03 AM    Final     Impression/Plan: GI bleed following a cardiac arrest with bilateral PEs and need for IV heparin. Question ischemic bowel as source of bleeding vs peptic ulcer disease. On pressors and mechanical ventilation. Bedside EGD. Informed consent obtained from niece. Protonix drip. Supportive care. Heparin held for 2 hours prior to EGD.     LOS: 0 days   Lear Ng  03/07/2020, 12:19 PM  Questions please call [phone]

## 2020-03-07 NOTE — Progress Notes (Signed)
Responded to ED Code Cool. Nurse asked Chaplain to find significant other in consult room.  Found lady there who was visibly upset and scared.  Escorted her to [place] and had prayer at bedside.    Invited lady to call Chaplain service for any further need.  De Burrs Chaplain Resident

## 2020-03-07 NOTE — Op Note (Signed)
[facility] Patient Name: Kenneth Sullivan Procedure Date : 03/07/2020 MRN: [id number] Attending MD: Lear Ng , MD Date of Birth: [birth year] CSN: [id number] Age: 64 Admit Type: Inpatient Procedure:                Upper GI endoscopy Indications:              Active gastrointestinal bleeding, Hematemesis Providers:                Lear Ng, MD, Lazaro Arms, Technician Referring MD:             hospital team Medicines:                Propofol per Anesthesia, Monitored Anesthesia Care Complications:            No immediate complications. Estimated Blood Loss:     Estimated blood loss was minimal. Procedure:                Pre-Anesthesia Assessment:                           - Prior to the procedure, a History and Physical                            was performed, and patient medications and                            allergies were reviewed. The patient's tolerance of                            previous anesthesia was also reviewed. The risks                            and benefits of the procedure and the sedation                            options and risks were discussed with the patient.                            All questions were answered, and informed consent                            was obtained. Prior Anticoagulants: The patient has                            taken no previous anticoagulant or antiplatelet                            agents. ASA Grade Assessment: IV - A patient with                            severe systemic disease that is a constant threat                            to life. After reviewing the risks and benefits,  the patient was deemed in satisfactory condition to                            undergo the procedure.                           After obtaining informed consent, the endoscope was                            passed under direct vision. Throughout the                            procedure, the  patient's blood pressure, pulse, and                            oxygen saturations were monitored continuously. The                            [id number]) Olympus gastroscope was                            introduced through the mouth, and advanced to the                            second part of duodenum. The upper GI endoscopy was                            performed with difficulty due to a large gastric                            ulcer. Successful completion of the procedure was                            aided by performing the maneuvers documented                            (below) in this report. The patient tolerated the                            procedure well. Scope In: Scope Out: Findings:      The examined esophagus was normal.      One partially obstructing oozing cratered gastric ulcer of significant       severity with adherent clot was found at the incisura and in the gastric       antrum. The lesion was 20 mm in largest dimension. Perforation of the       bowel wall cannot be ruled out.      The cardia and gastric fundus were normal on retroflexion.      The examined duodenum was normal.      - Oral gastric tube tip in mid-body (OG tube removed following the       procedure to avoid risk of suction trauma). Impression:               - Normal esophagus.                           -  Partially obstructing oozing gastric ulcer with                            adherent clot. Perforation of the bowel wall cannot                            be ruled out.                           - Normal examined duodenum.                           - No specimens collected. Recommendation:           - NPO.                           - Observe patient's clinical course.                           - Continue Protonix drip. Hold IV Heparin if                            possible. Embolization by IR and if not amenable to                            that and rebleeding occurs then will need  surgery                            if possible. Not amenable to endoscopic therapeutic                            maneuvers. Procedure Code(s):        --- Professional ---                           [phone], Esophagogastroduodenoscopy, flexible,                            transoral; diagnostic, including collection of                            specimen(s) by brushing or washing, when performed                            (separate procedure) Diagnosis Code(s):        --- Professional ---                           K92.2, Gastrointestinal hemorrhage, unspecified                           K25.4, Chronic or unspecified gastric ulcer with                            hemorrhage  K92.0, Hematemesis CPT copyright 2019 American Medical Association. All rights reserved. The codes documented in this report are preliminary and upon coder review may  be revised to meet current compliance requirements. Lear Ng, MD 03/07/2020 3:46:17 PM This report has been signed electronically. Number of Addenda: 0

## 2020-03-07 NOTE — Progress Notes (Signed)
EEG disconnected at box per directionsfor trip to IR

## 2020-03-07 NOTE — ED Notes (Signed)
Ice packs applied

## 2020-03-07 NOTE — Progress Notes (Signed)
Troponin High sensitivity 8,728 reported to Dr Wonda Amis

## 2020-03-07 NOTE — H&P (Addendum)
NAME:  Kenneth Sullivan, MRN:  [id number], DOB:  [birth year], LOS: 0 ADMISSION DATE:  03/07/2020, CONSULTATION DATE:  03/07/20 REFERRING MD:  Dr Roxanne Mins MD, CHIEF COMPLAINT:  Cardiac arrest  Brief History   64 year old with no significant past medical history.  Had acute shortness of breath with sats in the 40s on EMS arrival.  He had a witnessed bradycardic, asystolic arrest when EMS was present with 3 rounds of epi, 20 minutes to ROSC.  In the ED kings airway was changed to ET tube.  He became bradycardic again and given additional epi.  EKG with right bundle branch block  As per her significant other he had some mild shortness of breath last night but otherwise in usual health.  No cough, fevers, chills.  He had been complaining of swollen left leg with numbness for 2 to 3 weeks.  Past Medical History  Has not seen a doctor.  No past medical history  Smokes 1 pack/day, drinks 2 to 340 ounce beers every day.  Smokes marijuana.  Denies any other recreational drug use.  Significant Hospital Events   3/23-admit  Consults:  Cardiology  Procedures:    Significant Diagnostic Tests:  Chest x-ray 3/23-ET tube in stable portion, no acute cardiopulmonary abnormality. CTA CT head Echo Lower extremity duplex  Micro Data:  Bcx Sars COV2  Antimicrobials:    Interim history/subjective:    Objective   Blood pressure 116/88, pulse (!) 44, resp. rate (!) 22, height 6\' 2"  (1.88 m), SpO2 98 %.    Vent Mode: PRVC FiO2 (%):  [100 %] 100 % Set Rate:  [15 bmp] 15 bmp Vt Set:  [650 mL] 650 mL PEEP:  [5 cmH20] 5 cmH20 Plateau Pressure:  [14 cmH20] 14 cmH20  No intake or output data in the 24 hours ending 03/07/20 0804 There were no vitals filed for this visit.  Examination: Blood pressure 93/80, pulse (!) 107, temperature (!) 93.5 F (34.2 C), resp. rate (!) 28, height 6\' 2"  (1.88 m), weight 72.6 kg, SpO2 100 %. Gen:      No acute distress HEENT:  EOMI, sclera anicteric Neck:     No  masses; no thyromegaly, ETT Lungs:    Clear to auscultation bilaterally; normal respiratory effort CV:         Regular rate and rhythm; no murmurs Abd:      + bowel sounds; soft, non-tender; no palpable masses, no distension Ext:    No edema; adequate peripheral perfusion Skin:      Warm and dry; no rash Neuro: Sedated, opens eyes to commands and tries to move head to direction of voice.  Moving all extremities.  Pupils equal, reactive   Resolved Hospital Problem list     Assessment & Plan:  Cardiac arrest Unclear etiology.  Likely had respiratory event leading to cardiac arrest though EKG shows right bundle blanch block of unknown chronicity We will need to rule out PE given reported history of leg swelling Patient is waking up and purposeful.  Will do normothermia protocol Get CTA, CT head Lower extremity duplex, check UDS  Right bundle branch block, elevated troponin Cardiology consulted by EDP Follow troponins, echocardiogram  Elevated glucose SSI  AG metabolic acidosis Follow lactic acid, check ketones in UA, beta hydroxybutyrate LR for hydration  Best practice:  Diet: NPO Pain/Anxiety/Delirium protocol (if indicated): Propofol, fentanyl as needed.  RASS goal -1 - VAP protocol (if indicated): Ordered DVT prophylaxis: Heparin subcu GI prophylaxis: Pepcod Glucose control: SSI Mobility: Bed  Code Status: Full Family Communication: Significant other- Roxaland Harris updated in ED.  He has a niece and 2 sisters who have been informed.  [place] on the way to the hospital Disposition: ICU   Critical care time: 53   The patient is critically ill with multiple organ system failure and requires high complexity decision making for assessment and support, frequent evaluation and titration of therapies, advanced monitoring, review of radiographic studies and interpretation of complex data.   Critical Care Time devoted to patient care services, exclusive of separately billable  procedures, described in this note is 45 minutes.   Marshell Garfinkel MD [facility] Please see [email or web address] for pager details.  03/07/2020, 9:08 AM

## 2020-03-07 NOTE — ED Provider Notes (Signed)
Blood pressure 93/78, pulse (!) 107, temperature (!) 93.5 F (34.2 C), resp. rate (!) 21, height 6\' 2"  (1.88 m), weight 72.6 kg, SpO2 100 %.  10:00 AM  Spoke with Dr. Percival Spanish with cardiology after his evaluation of this patient.  Care has been assumed already by the critical care team.  He evaluated the patient's echo showing RV dysfunction.  He has concern for a large PE and is asking that we start heparin which I have ordered.  CTA of the chest has already been ordered.     Margette Fast, MD 03/07/20 1004

## 2020-03-07 NOTE — Progress Notes (Deleted)
EEG complete - results pending 

## 2020-03-07 NOTE — ED Triage Notes (Signed)
Pt comes via [facility] from home, c/o of resp distress, 49 % on RA, gave 0.3 epi, 2gm of mg silent chest and then witnessed arrest with EMS, asystole, PTA given 3 epi , 5mg  albuterol, 0.5 atrovent,  ROSC after appx 20 minutes of CPR, now SB

## 2020-03-08 ENCOUNTER — Inpatient Hospital Stay (HOSPITAL_COMMUNITY): Payer: Medicaid Other

## 2020-03-08 DIAGNOSIS — M7989 Other specified soft tissue disorders: Secondary | ICD-10-CM | POA: Diagnosis not present

## 2020-03-08 DIAGNOSIS — I469 Cardiac arrest, cause unspecified: Secondary | ICD-10-CM | POA: Diagnosis not present

## 2020-03-08 LAB — POCT I-STAT 7, (LYTES, BLD GAS, ICA,H+H)
Acid-base deficit: 4 mmol/L — ABNORMAL HIGH (ref 0.0–2.0)
Bicarbonate: 20.6 mmol/L (ref 20.0–28.0)
Calcium, Ion: 1.2 mmol/L (ref 1.15–1.40)
HCT: 36 % — ABNORMAL LOW (ref 39.0–52.0)
Hemoglobin: 12.2 g/dL — ABNORMAL LOW (ref 13.0–17.0)
O2 Saturation: 96 %
Patient temperature: 36
Potassium: 3.7 mmol/L (ref 3.5–5.1)
Sodium: 136 mmol/L (ref 135–145)
TCO2: 22 mmol/L (ref 22–32)
pCO2 arterial: 33.5 mmHg (ref 32.0–48.0)
pH, Arterial: 7.393 (ref 7.350–7.450)
pO2, Arterial: 78 mmHg — ABNORMAL LOW (ref 83.0–108.0)

## 2020-03-08 LAB — BASIC METABOLIC PANEL
Anion gap: 7 (ref 5–15)
Anion gap: 8 (ref 5–15)
Anion gap: 8 (ref 5–15)
Anion gap: 10 (ref 5–15)
Anion gap: 9 (ref 5–15)
BUN: 8 mg/dL (ref 8–23)
BUN: 5 mg/dL — ABNORMAL LOW (ref 8–23)
BUN: 6 mg/dL — ABNORMAL LOW (ref 8–23)
BUN: 6 mg/dL — ABNORMAL LOW (ref 8–23)
BUN: 7 mg/dL — ABNORMAL LOW (ref 8–23)
CO2: 20 mmol/L — ABNORMAL LOW (ref 22–32)
CO2: 20 mmol/L — ABNORMAL LOW (ref 22–32)
CO2: 20 mmol/L — ABNORMAL LOW (ref 22–32)
CO2: 19 mmol/L — ABNORMAL LOW (ref 22–32)
CO2: 19 mmol/L — ABNORMAL LOW (ref 22–32)
Calcium: 7.7 mg/dL — ABNORMAL LOW (ref 8.9–10.3)
Calcium: 8 mg/dL — ABNORMAL LOW (ref 8.9–10.3)
Calcium: 8 mg/dL — ABNORMAL LOW (ref 8.9–10.3)
Calcium: 8 mg/dL — ABNORMAL LOW (ref 8.9–10.3)
Calcium: 8.1 mg/dL — ABNORMAL LOW (ref 8.9–10.3)
Chloride: 109 mmol/L (ref 98–111)
Chloride: 108 mmol/L (ref 98–111)
Chloride: 108 mmol/L (ref 98–111)
Chloride: 108 mmol/L (ref 98–111)
Chloride: 107 mmol/L (ref 98–111)
Creatinine, Ser: 0.78 mg/dL (ref 0.61–1.24)
Creatinine, Ser: 0.78 mg/dL (ref 0.61–1.24)
Creatinine, Ser: 0.79 mg/dL (ref 0.61–1.24)
Creatinine, Ser: 0.8 mg/dL (ref 0.61–1.24)
Creatinine, Ser: 0.77 mg/dL (ref 0.61–1.24)
GFR calc Af Amer: >60 mL/min (ref >60)
GFR calc Af Amer: >60 mL/min (ref >60)
GFR calc Af Amer: >60 mL/min (ref >60)
GFR calc Af Amer: >60 mL/min (ref >60)
GFR calc Af Amer: >60 mL/min (ref >60)
GFR calc non Af Amer: >60 mL/min (ref >60)
GFR calc non Af Amer: >60 mL/min (ref >60)
GFR calc non Af Amer: >60 mL/min (ref >60)
GFR calc non Af Amer: >60 mL/min (ref >60)
GFR calc non Af Amer: >60 mL/min (ref >60)
Glucose, Bld: 115 mg/dL — ABNORMAL HIGH (ref 70–99)
Glucose, Bld: 101 mg/dL — ABNORMAL HIGH (ref 70–99)
Glucose, Bld: 92 mg/dL (ref 70–99)
Glucose, Bld: 93 mg/dL (ref 70–99)
Glucose, Bld: 91 mg/dL (ref 70–99)
Potassium: 4 mmol/L (ref 3.5–5.1)
Potassium: 3.8 mmol/L (ref 3.5–5.1)
Potassium: 4.7 mmol/L (ref 3.5–5.1)
Potassium: 4.3 mmol/L (ref 3.5–5.1)
Potassium: 4 mmol/L (ref 3.5–5.1)
Sodium: 136 mmol/L (ref 135–145)
Sodium: 136 mmol/L (ref 135–145)
Sodium: 136 mmol/L (ref 135–145)
Sodium: 137 mmol/L (ref 135–145)
Sodium: 135 mmol/L (ref 135–145)

## 2020-03-08 LAB — COMPREHENSIVE METABOLIC PANEL
ALT: 93 U/L — ABNORMAL HIGH (ref 0–44)
AST: 154 U/L — ABNORMAL HIGH (ref 15–41)
Albumin: 2 g/dL — ABNORMAL LOW (ref 3.5–5.0)
Alkaline Phosphatase: 69 U/L (ref 38–126)
Anion gap: 8 (ref 5–15)
BUN: 7 mg/dL — ABNORMAL LOW (ref 8–23)
CO2: 21 mmol/L — ABNORMAL LOW (ref 22–32)
Calcium: 7.9 mg/dL — ABNORMAL LOW (ref 8.9–10.3)
Chloride: 109 mmol/L (ref 98–111)
Creatinine, Ser: 0.77 mg/dL (ref 0.61–1.24)
GFR calc Af Amer: >60 mL/min (ref >60)
GFR calc non Af Amer: >60 mL/min (ref >60)
Glucose, Bld: 96 mg/dL (ref 70–99)
Potassium: 3.9 mmol/L (ref 3.5–5.1)
Sodium: 138 mmol/L (ref 135–145)
Total Bilirubin: 0.3 mg/dL (ref 0.3–1.2)
Total Protein: 4.9 g/dL — ABNORMAL LOW (ref 6.5–8.1)

## 2020-03-08 LAB — CBC
HCT: 36.1 % — ABNORMAL LOW (ref 39.0–52.0)
Hemoglobin: 12 g/dL — ABNORMAL LOW (ref 13.0–17.0)
MCH: 33.1 pg (ref 26.0–34.0)
MCHC: 33.2 g/dL (ref 30.0–36.0)
MCV: 99.4 fL (ref 80.0–100.0)
Platelets: 153 10*3/uL (ref 150–400)
RBC: 3.63 MIL/uL — ABNORMAL LOW (ref 4.22–5.81)
RDW: 15.2 % (ref 11.5–15.5)
WBC: 10 10*3/uL (ref 4.0–10.5)
nRBC: 0 % (ref 0.0–0.2)

## 2020-03-08 LAB — MAGNESIUM: Magnesium: 2.1 mg/dL (ref 1.7–2.4)

## 2020-03-08 LAB — GLUCOSE, CAPILLARY
Glucose-Capillary: 109 mg/dL — ABNORMAL HIGH (ref 70–99)
Glucose-Capillary: 94 mg/dL (ref 70–99)
Glucose-Capillary: 76 mg/dL (ref 70–99)
Glucose-Capillary: 79 mg/dL (ref 70–99)
Glucose-Capillary: 87 mg/dL (ref 70–99)
Glucose-Capillary: 86 mg/dL (ref 70–99)
Glucose-Capillary: 85 mg/dL (ref 70–99)

## 2020-03-08 LAB — PROTIME-INR
INR: 1.1 (ref 0.8–1.2)
Prothrombin Time: 14.4 seconds (ref 11.4–15.2)

## 2020-03-08 LAB — HEPARIN LEVEL (UNFRACTIONATED)
Heparin Unfractionated: <0.1 IU/mL — ABNORMAL LOW (ref 0.30–0.70)
Heparin Unfractionated: 0.43 IU/mL (ref 0.30–0.70)

## 2020-03-08 LAB — PHOSPHORUS: Phosphorus: 3.3 mg/dL (ref 2.5–4.6)

## 2020-03-08 LAB — TRIGLYCERIDES: Triglycerides: 114 mg/dL (ref <150)

## 2020-03-08 MED ORDER — LACTATED RINGERS IV BOLUS
500.0000 mL | Freq: Once | INTRAVENOUS | Status: AC
  Administered 2020-03-08: 500 mL via INTRAVENOUS

## 2020-03-08 MED ORDER — HEPARIN (PORCINE) 25000 UT/250ML-% IV SOLN
1300.0000 [IU]/h | INTRAVENOUS | Status: DC
  Administered 2020-03-08 – 2020-03-09 (×2): 1000 [IU]/h via INTRAVENOUS
  Administered 2020-03-10: 19:00:00 950 [IU]/h via INTRAVENOUS
  Administered 2020-03-11: 1300 [IU]/h via INTRAVENOUS
  Filled 2020-03-08 (×4): qty 250

## 2020-03-08 MED ORDER — LACTATED RINGERS IV BOLUS
1000.0000 mL | Freq: Once | INTRAVENOUS | Status: AC
  Administered 2020-03-08: 1000 mL via INTRAVENOUS

## 2020-03-08 MED ORDER — MIDAZOLAM HCL 2 MG/2ML IJ SOLN: 2.0000 mg | Freq: Once | INTRAMUSCULAR | Status: AC

## 2020-03-08 MED ORDER — MIDAZOLAM HCL 2 MG/2ML IJ SOLN
INTRAMUSCULAR | Status: AC
  Filled 2020-03-08: qty 2

## 2020-03-08 MED ORDER — MIDAZOLAM HCL 2 MG/2ML IJ SOLN
1.0000 mg | INTRAMUSCULAR | Status: DC | PRN
  Administered 2020-03-08 – 2020-03-10 (×7): 2 mg via INTRAVENOUS
  Administered 2020-03-10: 1 mg via INTRAVENOUS
  Administered 2020-03-10 – 2020-03-18 (×17): 2 mg via INTRAVENOUS
  Filled 2020-03-08 (×25): qty 2

## 2020-03-08 MED ORDER — VECURONIUM BROMIDE 10 MG IV SOLR: 0.1000 mg/kg | Freq: Once | INTRAVENOUS | Status: AC

## 2020-03-08 MED ORDER — MIDAZOLAM HCL 2 MG/2ML IJ SOLN
INTRAMUSCULAR | Status: AC
  Administered 2020-03-08: 2 mg via INTRAVENOUS
  Filled 2020-03-08: qty 2

## 2020-03-08 MED ORDER — VECURONIUM BROMIDE 10 MG IV SOLR
INTRAVENOUS | Status: AC
  Administered 2020-03-08: 7.4 mg via INTRAVENOUS
  Filled 2020-03-08: qty 10

## 2020-03-08 NOTE — Progress Notes (Signed)
K+ 4.7 and continued low urine output reported to Dr Wonda Amis

## 2020-03-08 NOTE — Progress Notes (Signed)
Progress Note  Patient Name: Kenneth Sullivan Date of Encounter: 03/08/2020  Primary Cardiologist:   No primary care provider on file.   Subjective   Intubated and sedated.    Inpatient Medications    Scheduled Meds: . chlorhexidine gluconate (MEDLINE KIT)  15 mL Mouth Rinse BID  . Chlorhexidine Gluconate Cloth  6 each Topical Q0600  . insulin aspart  0-9 Units Subcutaneous Q4H  . mouth rinse  15 mL Mouth Rinse 10 times per day  . [START ON 03/10/2020] pantoprazole  40 mg Intravenous Q12H   Continuous Infusions: . sodium chloride 20 mL/hr at 03/08/20 1300  . heparin    . lactated ringers 100 mL/hr at 03/08/20 1300  . norepinephrine (LEVOPHED) Adult infusion Stopped (03/08/20 1226)  . pantoprozole (PROTONIX) infusion 8 mg/hr (03/08/20 1300)  . propofol (DIPRIVAN) infusion 50 mcg/kg/min (03/08/20 1300)   PRN Meds: docusate sodium, fentaNYL (SUBLIMAZE) injection, gelatin adsorbable   Vital Signs    Vitals:   03/08/20 1215 03/08/20 1230 03/08/20 1245 03/08/20 1300  BP:      Pulse: (!) 111 (!) 116 (!) 109 (!) 107  Resp: (!) 26 (!) 29 (!) 23 18  Temp:    (!) 96.8 F (36 C)  TempSrc:    Bladder  SpO2: 100% 98% 96% 100%  Weight:      Height:        Intake/Output Summary (Last 24 hours) at 03/08/2020 1325 Last data filed at 03/08/2020 1300 Gross per 24 hour  Intake 3481.03 ml  Output 1115 ml  Net 2366.03 ml   Filed Weights   03/07/20 0810 03/08/20 0419  Weight: 72.6 kg 73.6 kg    Telemetry    ST - Personally Reviewed  ECG    NA - Personally Reviewed  Physical Exam   GEN: No acute distress.   Neck: No  JVD Cardiac: RRR, no murmurs, rubs, or gallops.  Respiratory:   Clear to auscultation bilaterally. GI: Soft, nontender, non-distended  MS: No  edema; No deformity. Neuro:  Nonfocal  Psych: Normal affect   Labs    Chemistry Recent Labs  Lab 03/07/20 0710 03/07/20 1144 03/08/20 0132 03/08/20 0132 03/08/20 0337 03/08/20 0342 03/08/20 1000  NA 138    < > 136   < > 138 136 136  K 3.8   < > 4.0   < > 3.9 3.7 3.8  CL 104   < > 109  --  109  --  108  CO2 14*   < > 20*  --  21*  --  20*  GLUCOSE 291*   < > 115*  --  96  --  101*  BUN 7*   < > 8  --  7*  --  5*  CREATININE 1.19   < > 0.78  --  0.77  --  0.78  CALCIUM 8.0*   < > 7.7*  --  7.9*  --  8.0*  PROT 4.4*  --   --   --  4.9*  --   --   ALBUMIN 1.9*  --   --   --  2.0*  --   --   AST 60*  --   --   --  154*  --   --   ALT 29  --   --   --  93*  --   --   ALKPHOS 55  --   --   --  69  --   --  BILITOT 0.6  --   --   --  0.3  --   --   GFRNONAA >60   < > >60  --  >60  --  >60  GFRAA >60   < > >60  --  >60  --  >60  ANIONGAP 20*   < > 7  --  8  --  8   < > = values in this interval not displayed.     Hematology Recent Labs  Lab 03/07/20 2030 03/07/20 2030 03/07/20 2155 03/08/20 0337 03/08/20 0342  WBC 10.8*  --  10.8* 10.0  --   RBC 3.60*  --  3.60* 3.63*  --   HGB 11.9*   < > 11.8* 12.0* 12.2*  HCT 35.4*   < > 36.0* 36.1* 36.0*  MCV 98.3  --  100.0 99.4  --   MCH 33.1  --  32.8 33.1  --   MCHC 33.6  --  32.8 33.2  --   RDW 15.3  --  15.3 15.2  --   PLT 154  --  155 153  --    < > = values in this interval not displayed.    Cardiac EnzymesNo results for input(s): TROPONINI in the last 168 hours. No results for input(s): TROPIPOC in the last 168 hours.   BNP Recent Labs  Lab 03/07/20 0710  BNP 74.6     DDimer No results for input(s): DDIMER in the last 168 hours.   Radiology    CT Head Wo Contrast  Result Date: 03/07/2020 CLINICAL DATA:  64 year old male with history of altered mental status. EXAM: CT HEAD WITHOUT CONTRAST TECHNIQUE: Contiguous axial images were obtained from the base of the skull through the vertex without intravenous contrast. COMPARISON:  No priors. FINDINGS: Brain: Patchy areas of decreased attenuation are noted throughout the deep and periventricular white matter of the cerebral hemispheres bilaterally, compatible with chronic  microvascular ischemic disease. no evidence of acute infarction, hemorrhage, hydrocephalus, extra-axial collection or mass lesion/mass effect. Vascular: No hyperdense vessel or unexpected calcification. Skull: Normal. Negative for fracture or focal lesion. Sinuses/Orbits: No acute finding. Other: Endotracheal and nasogastric tubes are incompletely imaged. IMPRESSION: 1. No acute intracranial abnormalities. 2. Mild chronic microvascular ischemic changes in the cerebral white matter, as above. Electronically Signed   By: Vinnie Langton M.D.   On: 03/07/2020 10:56   CT ANGIO CHEST PE W OR WO CONTRAST  Result Date: 03/07/2020 CLINICAL DATA:  Cardiac arrest, intubated EXAM: CT ANGIOGRAPHY CHEST WITH CONTRAST TECHNIQUE: Multidetector CT imaging of the chest was performed using the standard protocol during bolus administration of intravenous contrast. Multiplanar CT image reconstructions and MIPs were obtained to evaluate the vascular anatomy. CONTRAST:  80 cc OMNIPAQUE IOHEXOL 350 MG/ML SOLN COMPARISON:  03/07/2020 FINDINGS: Cardiovascular: This is a technically adequate evaluation of the pulmonary vasculature. There are bilateral pulmonary emboli right greater than left. There is straightening of the interventricular septum and dilatation of the right ventricle compatible with right heart strain. 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 The thoracic aorta demonstrates normal caliber with no aneurysm or dissection. No pericardial effusion. Mediastinum/Nodes: No enlarged mediastinal, hilar, or axillary lymph nodes. Thyroid gland, trachea, and esophagus demonstrate no significant findings. Endotracheal tube identified well above carina. Enteric catheter extends into the gastric lumen. Lungs/Pleura: Scattered upper lobe predominant emphysematous changes are seen. Patchy subpleural consolidation superior segment right lower lobe is nonspecific. No effusion or  pneumothorax. Mild lower lobe predominant bronchiectasis. The central airways are patent. Upper Abdomen: Rounded cystic structure left upper quadrant likely partially visualized renal cyst. This measures up to 7.3 cm in diameter. Musculoskeletal: No acute or destructive bony lesions. Reconstructed images demonstrate no additional findings. Review of the MIP images confirms the above findings. IMPRESSION: 1. Bilateral pulmonary emboli. Positive for acute PE with CT evidence of right heart strain (RV/LV Ratio = 1.9) consistent with at least submassive (intermediate risk) PE. The presence of right heart strain has been associated with an increased risk of morbidity and mortality. 2. Emphysema.  Mild bronchiectasis bilaterally. 3. Support devices as above. 4. 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 These results were called by telephone at the time of interpretation on 03/07/2020 at 11:16 a.m. to provider [facility] , who verbally acknowledged these results. Electronically Signed   By: Randa Ngo M.D.   On: 03/07/2020 11:20   IR Angiogram Visceral Selective  Result Date: 03/08/2020 INDICATION: 64 year old male with cardiac arrest, massive PE diagnosis, GI hemorrhage with large gastric ulcer, anti-coagulation is contraindicated. He presents for mesenteric angiogram and possible embolization. Same session PE aspiration thrombectomy. EXAM: US GUIDED ACCESS RIGHT COMMON FEMORAL ARTERY MESENTERIC ANGIOGRAM, CELIAC ARTERY, RIGHT GASTRIC ARTERY, GASTRODUODENAL ARTERY COIL EMBOLIZATION OF RIGHT GASTRIC ARTERY AND GDA EXOSEAL DEPLOYED FOR HEMOSTASIS MEDICATIONS: None ANESTHESIA/SEDATION: Moderate (conscious) sedation was employed during this procedure. A total of Versed 0 mg and Fentanyl 100 mcg was administered intravenously. Moderate Sedation Time: 2 hours 24 minutes minutes. The patient's level of consciousness and vital signs were  monitored continuously by radiology nursing throughout the procedure under my direct supervision. CONTRAST:  330 cc IV contrast. This total reflects the total amount of contrast for 3 separate cases under 1 anesthesia time. FLUOROSCOPY TIME:  Fluoroscopy Time: 31 minutes 12 seconds (1,144 mGy). This fluoroscopy time reflects 3 separate cases performed. COMPLICATIONS: None PROCEDURE: Informed consent was obtained from the patient following explanation of the procedure, risks, benefits and alternatives. The patient understands, agrees and consents for the procedure. All questions were addressed. A time out was performed prior to the initiation of the procedure. Maximal barrier sterile technique utilized including caps, mask, sterile gowns, sterile gloves, large sterile drape, hand hygiene, and Betadine prep. Once the IVC filter was placed and mechanical thrombectomy of the pulmonary arteries was performed, we proceeded with mesenteric angiogram and possible embolization. Ultrasound survey of the right inguinal region was performed with images stored and sent to PACs, confirming patency of the vessel. A micropuncture needle was used access the right common femoral artery under ultrasound. With excellent arterial blood flow returned, and an .018 micro wire was passed through the needle, observed enter the abdominal aorta under fluoroscopy. The needle was removed, and a micropuncture sheath was placed over the wire. The inner dilator and wire were removed, and an 035 Bentson wire was advanced under fluoroscopy into the abdominal aorta. The sheath was removed and a standard 5 [place] vascular sheath was placed. The dilator was removed and the sheath was flushed. A standard C2 Cobra catheter was passed on the Bentson wire used to select the origin of the celiac artery. Celiac artery origin anatomy did not except a good purchase of these Cobra catheter, and thus the Cobra catheter was exchanged for a Mickelson catheter.  Mickelson catheter achieved adequate purchase of the celiac artery origin. Angiogram was performed. 150 cm STC catheter was advanced with a 14  fathom wire, used to select the common hepatic artery. Fathom wire was advanced into the right gastric artery with the [place] catheter following easily. Angiogram was performed. Coil embolization of the right gastric artery was performed with 2 by 2 mm coils The catheter was then withdrawn and wire was used to select the gastroduodenal artery. Catheter was advanced into the gastroduodenal artery. Angiogram was performed. Coil embolization was then performed of the GDA with combination of 5 mm coils. Final angiogram performed. We then withdrew all catheters wires and deployed Exoseal for hemostasis. Patient remained hemodynamically stable throughout. No complications were encountered and no significant blood loss from this portion of the examination. IMPRESSION: Status post ultrasound guided access right common femoral artery for mesenteric angiogram and empiric coil embolization of right gastric artery and gastroduodenal artery, as the preferential arterial supply to the anatomic location of large gastric ulcer. Signed, Dulcy Fanny. Dellia Nims, RPVI [facility] Radiology Electronically Signed   By: Corrie Mckusick D.O.   On: 03/08/2020 09:33   IR Angiogram Selective Each Additional Vessel  Result Date: 03/08/2020 INDICATION: 63 year old male with cardiac arrest, massive PE diagnosis, GI hemorrhage with large gastric ulcer, anti-coagulation is contraindicated. He presents for mesenteric angiogram and possible embolization. Same session PE aspiration thrombectomy. EXAM: US GUIDED ACCESS RIGHT COMMON FEMORAL ARTERY MESENTERIC ANGIOGRAM, CELIAC ARTERY, RIGHT GASTRIC ARTERY, GASTRODUODENAL ARTERY COIL EMBOLIZATION OF RIGHT GASTRIC ARTERY AND GDA EXOSEAL DEPLOYED FOR HEMOSTASIS MEDICATIONS: None ANESTHESIA/SEDATION: Moderate (conscious)  sedation was employed during this procedure. A total of Versed 0 mg and Fentanyl 100 mcg was administered intravenously. Moderate Sedation Time: 2 hours 24 minutes minutes. The patient's level of consciousness and vital signs were monitored continuously by radiology nursing throughout the procedure under my direct supervision. CONTRAST:  330 cc IV contrast. This total reflects the total amount of contrast for 3 separate cases under 1 anesthesia time. FLUOROSCOPY TIME:  Fluoroscopy Time: 31 minutes 12 seconds (1,144 mGy). This fluoroscopy time reflects 3 separate cases performed. COMPLICATIONS: None PROCEDURE: Informed consent was obtained from the patient following explanation of the procedure, risks, benefits and alternatives. The patient understands, agrees and consents for the procedure. All questions were addressed. A time out was performed prior to the initiation of the procedure. Maximal barrier sterile technique utilized including caps, mask, sterile gowns, sterile gloves, large sterile drape, hand hygiene, and Betadine prep. Once the IVC filter was placed and mechanical thrombectomy of the pulmonary arteries was performed, we proceeded with mesenteric angiogram and possible embolization. Ultrasound survey of the right inguinal region was performed with images stored and sent to PACs, confirming patency of the vessel. A micropuncture needle was used access the right common femoral artery under ultrasound. With excellent arterial blood flow returned, and an .018 micro wire was passed through the needle, observed enter the abdominal aorta under fluoroscopy. The needle was removed, and a micropuncture sheath was placed over the wire. The inner dilator and wire were removed, and an 035 Bentson wire was advanced under fluoroscopy into the abdominal aorta. The sheath was removed and a standard 5 [place] vascular sheath was placed. The dilator was removed and the sheath was flushed. A standard C2 Cobra catheter was  passed on the Bentson wire used to select the origin of the celiac artery. Celiac artery origin anatomy did not except a good purchase of these Cobra catheter, and thus the Cobra catheter was exchanged for a Mickelson catheter. Mickelson catheter achieved adequate purchase of the celiac  artery origin. Angiogram was performed. 150 cm STC catheter was advanced with a 14 fathom wire, used to select the common hepatic artery. Fathom wire was advanced into the right gastric artery with the [place] catheter following easily. Angiogram was performed. Coil embolization of the right gastric artery was performed with 2 by 2 mm coils The catheter was then withdrawn and wire was used to select the gastroduodenal artery. Catheter was advanced into the gastroduodenal artery. Angiogram was performed. Coil embolization was then performed of the GDA with combination of 5 mm coils. Final angiogram performed. We then withdrew all catheters wires and deployed Exoseal for hemostasis. Patient remained hemodynamically stable throughout. No complications were encountered and no significant blood loss from this portion of the examination. IMPRESSION: Status post ultrasound guided access right common femoral artery for mesenteric angiogram and empiric coil embolization of right gastric artery and gastroduodenal artery, as the preferential arterial supply to the anatomic location of large gastric ulcer. Signed, Dulcy Fanny. Dellia Nims, RPVI [facility] Radiology Electronically Signed   By: Corrie Mckusick D.O.   On: 03/08/2020 09:33   IR Angiogram Selective Each Additional Vessel  Result Date: 03/08/2020 INDICATION: 64 year old male with cardiac arrest, massive PE diagnosis, GI hemorrhage with large gastric ulcer, anti-coagulation is contraindicated. He presents for mesenteric angiogram and possible embolization. Same session PE aspiration thrombectomy. EXAM: US GUIDED ACCESS RIGHT COMMON FEMORAL  ARTERY MESENTERIC ANGIOGRAM, CELIAC ARTERY, RIGHT GASTRIC ARTERY, GASTRODUODENAL ARTERY COIL EMBOLIZATION OF RIGHT GASTRIC ARTERY AND GDA EXOSEAL DEPLOYED FOR HEMOSTASIS MEDICATIONS: None ANESTHESIA/SEDATION: Moderate (conscious) sedation was employed during this procedure. A total of Versed 0 mg and Fentanyl 100 mcg was administered intravenously. Moderate Sedation Time: 2 hours 24 minutes minutes. The patient's level of consciousness and vital signs were monitored continuously by radiology nursing throughout the procedure under my direct supervision. CONTRAST:  330 cc IV contrast. This total reflects the total amount of contrast for 3 separate cases under 1 anesthesia time. FLUOROSCOPY TIME:  Fluoroscopy Time: 31 minutes 12 seconds (1,144 mGy). This fluoroscopy time reflects 3 separate cases performed. COMPLICATIONS: None PROCEDURE: Informed consent was obtained from the patient following explanation of the procedure, risks, benefits and alternatives. The patient understands, agrees and consents for the procedure. All questions were addressed. A time out was performed prior to the initiation of the procedure. Maximal barrier sterile technique utilized including caps, mask, sterile gowns, sterile gloves, large sterile drape, hand hygiene, and Betadine prep. Once the IVC filter was placed and mechanical thrombectomy of the pulmonary arteries was performed, we proceeded with mesenteric angiogram and possible embolization. Ultrasound survey of the right inguinal region was performed with images stored and sent to PACs, confirming patency of the vessel. A micropuncture needle was used access the right common femoral artery under ultrasound. With excellent arterial blood flow returned, and an .018 micro wire was passed through the needle, observed enter the abdominal aorta under fluoroscopy. The needle was removed, and a micropuncture sheath was placed over the wire. The inner dilator and wire were removed, and an 035  Bentson wire was advanced under fluoroscopy into the abdominal aorta. The sheath was removed and a standard 5 [place] vascular sheath was placed. The dilator was removed and the sheath was flushed. A standard C2 Cobra catheter was passed on the Bentson wire used to select the origin of the celiac artery. Celiac artery origin anatomy did not except a good purchase of these Cobra catheter, and thus the Cobra catheter  was exchanged for a Mickelson catheter. Mickelson catheter achieved adequate purchase of the celiac artery origin. Angiogram was performed. 150 cm STC catheter was advanced with a 14 fathom wire, used to select the common hepatic artery. Fathom wire was advanced into the right gastric artery with the [place] catheter following easily. Angiogram was performed. Coil embolization of the right gastric artery was performed with 2 by 2 mm coils The catheter was then withdrawn and wire was used to select the gastroduodenal artery. Catheter was advanced into the gastroduodenal artery. Angiogram was performed. Coil embolization was then performed of the GDA with combination of 5 mm coils. Final angiogram performed. We then withdrew all catheters wires and deployed Exoseal for hemostasis. Patient remained hemodynamically stable throughout. No complications were encountered and no significant blood loss from this portion of the examination. IMPRESSION: Status post ultrasound guided access right common femoral artery for mesenteric angiogram and empiric coil embolization of right gastric artery and gastroduodenal artery, as the preferential arterial supply to the anatomic location of large gastric ulcer. Signed, Dulcy Fanny. Dellia Nims, RPVI [facility] Radiology Electronically Signed   By: Corrie Mckusick D.O.   On: 03/08/2020 09:33   IR Angiogram Selective Each Additional Vessel  Result Date: 03/08/2020 INDICATION: 64 year old male with cardiac arrest, massive PE diagnosis,  GI hemorrhage with large gastric ulcer, anti-coagulation is contraindicated. He presents for mesenteric angiogram and possible embolization. Same session PE aspiration thrombectomy. EXAM: US GUIDED ACCESS RIGHT COMMON FEMORAL ARTERY MESENTERIC ANGIOGRAM, CELIAC ARTERY, RIGHT GASTRIC ARTERY, GASTRODUODENAL ARTERY COIL EMBOLIZATION OF RIGHT GASTRIC ARTERY AND GDA EXOSEAL DEPLOYED FOR HEMOSTASIS MEDICATIONS: None ANESTHESIA/SEDATION: Moderate (conscious) sedation was employed during this procedure. A total of Versed 0 mg and Fentanyl 100 mcg was administered intravenously. Moderate Sedation Time: 2 hours 24 minutes minutes. The patient's level of consciousness and vital signs were monitored continuously by radiology nursing throughout the procedure under my direct supervision. CONTRAST:  330 cc IV contrast. This total reflects the total amount of contrast for 3 separate cases under 1 anesthesia time. FLUOROSCOPY TIME:  Fluoroscopy Time: 31 minutes 12 seconds (1,144 mGy). This fluoroscopy time reflects 3 separate cases performed. COMPLICATIONS: None PROCEDURE: Informed consent was obtained from the patient following explanation of the procedure, risks, benefits and alternatives. The patient understands, agrees and consents for the procedure. All questions were addressed. A time out was performed prior to the initiation of the procedure. Maximal barrier sterile technique utilized including caps, mask, sterile gowns, sterile gloves, large sterile drape, hand hygiene, and Betadine prep. Once the IVC filter was placed and mechanical thrombectomy of the pulmonary arteries was performed, we proceeded with mesenteric angiogram and possible embolization. Ultrasound survey of the right inguinal region was performed with images stored and sent to PACs, confirming patency of the vessel. A micropuncture needle was used access the right common femoral artery under ultrasound. With excellent arterial blood flow returned, and an .018  micro wire was passed through the needle, observed enter the abdominal aorta under fluoroscopy. The needle was removed, and a micropuncture sheath was placed over the wire. The inner dilator and wire were removed, and an 035 Bentson wire was advanced under fluoroscopy into the abdominal aorta. The sheath was removed and a standard 5 [place] vascular sheath was placed. The dilator was removed and the sheath was flushed. A standard C2 Cobra catheter was passed on the Bentson wire used to select the origin of the celiac artery. Celiac artery origin anatomy did  not except a good purchase of these Cobra catheter, and thus the Cobra catheter was exchanged for a Mickelson catheter. Mickelson catheter achieved adequate purchase of the celiac artery origin. Angiogram was performed. 150 cm STC catheter was advanced with a 14 fathom wire, used to select the common hepatic artery. Fathom wire was advanced into the right gastric artery with the [place] catheter following easily. Angiogram was performed. Coil embolization of the right gastric artery was performed with 2 by 2 mm coils The catheter was then withdrawn and wire was used to select the gastroduodenal artery. Catheter was advanced into the gastroduodenal artery. Angiogram was performed. Coil embolization was then performed of the GDA with combination of 5 mm coils. Final angiogram performed. We then withdrew all catheters wires and deployed Exoseal for hemostasis. Patient remained hemodynamically stable throughout. No complications were encountered and no significant blood loss from this portion of the examination. IMPRESSION: Status post ultrasound guided access right common femoral artery for mesenteric angiogram and empiric coil embolization of right gastric artery and gastroduodenal artery, as the preferential arterial supply to the anatomic location of large gastric ulcer. Signed, Dulcy Fanny. Dellia Nims, RPVI [facility]  Radiology Electronically Signed   By: Corrie Mckusick D.O.   On: 03/08/2020 09:33   IR IVC FILTER PLMT / S&I Burke Keels GUID/MOD SED  Result Date: 03/08/2020 INDICATION: 64 year old male with cardiac arrest, massive PE diagnosis, GI hemorrhage with large gastric ulcer, anti-coagulation is contraindicated. He presents for IVC filter placement, same session PE mechanical/aspiration thrombectomy, and mesenteric angiogram with embolization. EXAM: US GUIDED RIGHT COMMON FEMORAL VEIN ACCESS VENOGRAM IVC FILTER PLACEMENT MEDICATIONS: None. ANESTHESIA/SEDATION: 0 mg IV Versed; 100 mcg IV Fentanyl Moderate Sedation Time:  0 minutes The patient was continuously monitored during the procedure by the interventional radiology nurse under my direct supervision. In addition the patient was intubated with respiratory teen involved with his care. FLUOROSCOPY TIME:  Fluoroscopy Time: 31 minutes 12 seconds (1,144 mGy). This fluoroscopy time reflects 3 separate cases performed, with separate dictation COMPLICATIONS: None PROCEDURE: The procedure, risks, benefits, and alternatives were explained to the patient. Specific risks discussed include bleeding, infection, contrast reaction, renal failure, IVC filter fracture, migration, iliocaval thrombus (3-4% incidence), need for further procedure, need for further surgery, pulmonary embolism, cardiopulmonary collapse, death. Questions regarding the procedure were encouraged and answered. The patient understands and consents to the procedure. Ultrasound survey was performed with images stored and sent to PACs. The right inguinal region was prepped with chlorhexidine in a sterile fashion, and a sterile drape was applied covering the operative field. A sterile gown and sterile gloves were used for the procedure. Local anesthesia was provided with 1% Lidocaine. We elected to start with access of the right common femoral vein, given our forthcoming attempt at pulmonary embolism thrombectomy with the  [facility] device. A micropuncture needle was used access the right common femoral vein under ultrasound. With excellent venous blood flow returned, and an .018 micro wire was passed through the needle. Small incision was made with an 11 blade scalpel. The needle was removed, and a micropuncture sheath was placed over the wire. The inner dilator and wire were removed, and an 035 Bentson wire was advanced under fluoroscopy into the IVC. Standard 5 French sheath was placed. Venogram was performed. The right jugular region was prepped and draped in the usual sterile fashion. 1% lidocaine was used for local anesthesia. A micropuncture needle was used access the right internal jugular vein  under ultrasound. With excellent venous blood flow returned, and an .018 micro wire was passed through the needle. Small incision was made with an 11 blade scalpel. The needle was removed, and a micropuncture sheath was placed over the wire. The inner dilator and wire were removed, and an 035 Bentson wire was advanced under fluoroscopy into the IVC. Serial dilation of the soft tissue tract was performed with an 8 [place] dilator and subsequently a 10 [place] dilator. The delivery sheath for a retrievable Bard Denali filter was passed over the Bentson wire into the IVC. The wire was removed and small contrast was used to confirm IVC location. IVC cavagram performed. Dilator was removed, and the IVC filter was then delivered, positioned below the lowest renal vein. Repeat cavagram performed, and the catheter was removed. After placement of the filter, we proceeded with serial dilation of the IJ access site, for placement of 58F proprietary Gore dry-seal sheath. Patient tolerated the procedure well and remained hemodynamically stable throughout. No complications were encountered and no significant blood loss was encounter. IMPRESSION: Status post ultrasound guided access right common femoral vein with venogram confirming ilio caval thrombus.  Status post ultrasound guided access right internal jugular vein with placement of retrievable IVC filter. We then proceeded with pulmonary angiogram and mechanical/aspiration thrombectomy. This will be dictated under a separate dictation Signed, Dulcy Fanny. 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 PLAN: This IVC filter is potentially retrievable. The patient will be assessed for filter retrieval by Interventional Radiology in approximately 8-12 weeks. Further recommendations regarding filter retrieval, continued surveillance or declaration of device permanence, will be made at that time. Electronically Signed   By: Corrie Mckusick D.O.   On: 03/08/2020 09:27   IR US Guide Vasc Access Right  Result Date: 03/08/2020 INDICATION: 64 year old male with cardiac arrest, massive PE diagnosis, GI hemorrhage with large gastric ulcer, anti-coagulation is contraindicated. He presents for mesenteric angiogram and possible embolization. Same session PE aspiration thrombectomy. EXAM: US GUIDED ACCESS RIGHT COMMON FEMORAL ARTERY MESENTERIC ANGIOGRAM, CELIAC ARTERY, RIGHT GASTRIC ARTERY, GASTRODUODENAL ARTERY COIL EMBOLIZATION OF RIGHT GASTRIC ARTERY AND GDA EXOSEAL DEPLOYED FOR HEMOSTASIS MEDICATIONS: None ANESTHESIA/SEDATION: Moderate (conscious) sedation was employed during this procedure. A total of Versed 0 mg and Fentanyl 100 mcg was administered intravenously. Moderate Sedation Time: 2 hours 24 minutes minutes. The patient's level of consciousness and vital signs were monitored continuously by radiology nursing throughout the procedure under my direct supervision. CONTRAST:  330 cc IV contrast. This total reflects the total amount of contrast for 3 separate cases under 1 anesthesia time. FLUOROSCOPY TIME:  Fluoroscopy Time: 31 minutes 12 seconds (1,144  mGy). This fluoroscopy time reflects 3 separate cases performed. COMPLICATIONS: None PROCEDURE: Informed consent was obtained from the patient following explanation of the procedure, risks, benefits and alternatives. The patient understands, agrees and consents for the procedure. All questions were addressed. A time out was performed prior to the initiation of the procedure. Maximal barrier sterile technique utilized including caps, mask, sterile gowns, sterile gloves, large sterile drape, hand hygiene, and Betadine prep. Once the IVC filter was placed and mechanical thrombectomy of the pulmonary arteries was performed, we proceeded with mesenteric angiogram and possible embolization. Ultrasound survey of the right inguinal region was performed with images stored and sent to PACs, confirming patency of the vessel. A micropuncture needle was used access  the right common femoral artery under ultrasound. With excellent arterial blood flow returned, and an .018 micro wire was passed through the needle, observed enter the abdominal aorta under fluoroscopy. The needle was removed, and a micropuncture sheath was placed over the wire. The inner dilator and wire were removed, and an 035 Bentson wire was advanced under fluoroscopy into the abdominal aorta. The sheath was removed and a standard 5 [place] vascular sheath was placed. The dilator was removed and the sheath was flushed. A standard C2 Cobra catheter was passed on the Bentson wire used to select the origin of the celiac artery. Celiac artery origin anatomy did not except a good purchase of these Cobra catheter, and thus the Cobra catheter was exchanged for a Mickelson catheter. Mickelson catheter achieved adequate purchase of the celiac artery origin. Angiogram was performed. 150 cm STC catheter was advanced with a 14 fathom wire, used to select the common hepatic artery. Fathom wire was advanced into the right gastric artery with the [place] catheter following easily.  Angiogram was performed. Coil embolization of the right gastric artery was performed with 2 by 2 mm coils The catheter was then withdrawn and wire was used to select the gastroduodenal artery. Catheter was advanced into the gastroduodenal artery. Angiogram was performed. Coil embolization was then performed of the GDA with combination of 5 mm coils. Final angiogram performed. We then withdrew all catheters wires and deployed Exoseal for hemostasis. Patient remained hemodynamically stable throughout. No complications were encountered and no significant blood loss from this portion of the examination. IMPRESSION: Status post ultrasound guided access right common femoral artery for mesenteric angiogram and empiric coil embolization of right gastric artery and gastroduodenal artery, as the preferential arterial supply to the anatomic location of large gastric ulcer. Signed, Dulcy Fanny. Dellia Nims, RPVI Vascular and Interventional Radiology Specialists [facility] Radiology Electronically Signed   By: Corrie Mckusick D.O.   On: 03/08/2020 09:33   IR US Guide Vasc Access Right  Result Date: 03/08/2020 INDICATION: 64 year old male with cardiac arrest, massive PE diagnosis, GI hemorrhage with large gastric ulcer, anti-coagulation is contraindicated. He presents for IVC filter placement, same session PE mechanical/aspiration thrombectomy, and mesenteric angiogram with embolization. EXAM: US GUIDED RIGHT COMMON FEMORAL VEIN ACCESS VENOGRAM IVC FILTER PLACEMENT MEDICATIONS: None. ANESTHESIA/SEDATION: 0 mg IV Versed; 100 mcg IV Fentanyl Moderate Sedation Time:  0 minutes The patient was continuously monitored during the procedure by the interventional radiology nurse under my direct supervision. In addition the patient was intubated with respiratory teen involved with his care. FLUOROSCOPY TIME:  Fluoroscopy Time: 31 minutes 12 seconds (1,144 mGy). This fluoroscopy time reflects 3 separate cases performed, with separate dictation  COMPLICATIONS: None PROCEDURE: The procedure, risks, benefits, and alternatives were explained to the patient. Specific risks discussed include bleeding, infection, contrast reaction, renal failure, IVC filter fracture, migration, iliocaval thrombus (3-4% incidence), need for further procedure, need for further surgery, pulmonary embolism, cardiopulmonary collapse, death. Questions regarding the procedure were encouraged and answered. The patient understands and consents to the procedure. Ultrasound survey was performed with images stored and sent to PACs. The right inguinal region was prepped with chlorhexidine in a sterile fashion, and a sterile drape was applied covering the operative field. A sterile gown and sterile gloves were used for the procedure. Local anesthesia was provided with 1% Lidocaine. We elected to start with access of the right common femoral vein, given our forthcoming attempt at pulmonary embolism thrombectomy with the [facility] device. A micropuncture needle  was used access the right common femoral vein under ultrasound. With excellent venous blood flow returned, and an .018 micro wire was passed through the needle. Small incision was made with an 11 blade scalpel. The needle was removed, and a micropuncture sheath was placed over the wire. The inner dilator and wire were removed, and an 035 Bentson wire was advanced under fluoroscopy into the IVC. Standard 5 French sheath was placed. Venogram was performed. The right jugular region was prepped and draped in the usual sterile fashion. 1% lidocaine was used for local anesthesia. A micropuncture needle was used access the right internal jugular vein under ultrasound. With excellent venous blood flow returned, and an .018 micro wire was passed through the needle. Small incision was made with an 11 blade scalpel. The needle was removed, and a micropuncture sheath was placed over the wire. The inner dilator and wire were removed, and an 035 Bentson  wire was advanced under fluoroscopy into the IVC. Serial dilation of the soft tissue tract was performed with an 8 [place] dilator and subsequently a 10 [place] dilator. The delivery sheath for a retrievable Bard Denali filter was passed over the Bentson wire into the IVC. The wire was removed and small contrast was used to confirm IVC location. IVC cavagram performed. Dilator was removed, and the IVC filter was then delivered, positioned below the lowest renal vein. Repeat cavagram performed, and the catheter was removed. After placement of the filter, we proceeded with serial dilation of the IJ access site, for placement of 57F proprietary Gore dry-seal sheath. Patient tolerated the procedure well and remained hemodynamically stable throughout. No complications were encountered and no significant blood loss was encounter. IMPRESSION: Status post ultrasound guided access right common femoral vein with venogram confirming ilio caval thrombus. Status post ultrasound guided access right internal jugular vein with placement of retrievable IVC filter. We then proceeded with pulmonary angiogram and mechanical/aspiration thrombectomy. This will be dictated under a separate dictation Signed, Dulcy Fanny. 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 PLAN: This IVC filter is potentially retrievable. The patient will be assessed for filter retrieval by Interventional Radiology in approximately 8-12 weeks. Further recommendations regarding filter retrieval, continued surveillance or declaration of device permanence, will be made at that time. Electronically Signed   By: Corrie Mckusick D.O.   On: 03/08/2020 09:27   DG Chest Port 1 View  Result Date: 03/08/2020 CLINICAL DATA:  Acute respiratory failure EXAM: PORTABLE CHEST 1 VIEW COMPARISON:  None. FINDINGS: The  heart size and mediastinal contours are within normal limits. ETT is 1.7 cm above the carina. A left-sided PICC is seen with the tip at the lower SVC. The lungs are clear. No focal airspace consolidation or pleural effusion. No acute osseous abnormality. Both lungs are clear. The visualized skeletal structures are unremarkable. IMPRESSION: No active disease. Electronically Signed   By: Prudencio Pair M.D.   On: 03/08/2020 06:09   DG Chest Port 1 View  Result Date: 03/07/2020 CLINICAL DATA:  Central line placement EXAM: PORTABLE CHEST 1 VIEW COMPARISON:  03/07/2020 at 0654 hours FINDINGS: Interval placement of a left subclavian approach central venous catheter with distal tip terminating at the level of the distal SVC. ET tube terminates 2.5 cm superior to the carina. Enteric tube courses below the diaphragm with distal tip and side hole beyond the inferior margin of the  film. Normal heart size. No focal airspace consolidation, pleural effusion, or pneumothorax. IMPRESSION: Interval placement of a left subclavian approach central venous catheter with distal tip terminating at the level of the distal SVC. No pneumothorax. Electronically Signed   By: Davina Poke D.O.   On: 03/07/2020 13:19   DG Chest Portable 1 View  Result Date: 03/07/2020 CLINICAL DATA:  Intubation.  Respiratory distress EXAM: PORTABLE CHEST 1 VIEW COMPARISON:  None. FINDINGS: Endotracheal tube tip is just below the clavicular heads. Normal heart size and mediastinal contours. There is no edema, consolidation, effusion, or pneumothorax. IMPRESSION: 1. Unremarkable endotracheal tube positioning. 2. No visible cardiopulmonary disease. Electronically Signed   By: Monte Fantasia M.D.   On: 03/07/2020 07:11   EEG adult  Result Date: 03/07/2020 Kenneth Havens, MD     03/07/2020  1:45 PM Patient Name: Kenneth Sullivan MRN: [id number] Epilepsy Attending: Lora Sullivan Referring Physician/Provider: Dr. Marshell Garfinkel Date: 03/07/2020 Duration:  22.25 minutes Patient history: 64 year old male presented after cardiac arrest.  EEG to evaluate for seizures. Level of alertness: Comatose AEDs during EEG study: Propofol Technical aspects: This EEG study was done with scalp electrodes positioned according to the 10-20 International system of electrode placement. Electrical activity was acquired at a sampling rate of 500Hz  and reviewed with a high frequency filter of 70Hz  and a low frequency filter of 1Hz . EEG data were recorded continuously and digitally stored. Description: EEG showed continuous generalized low amplitude 3-6Hz  theta-delta slowing with overriding 15 to 18 Hz, 2-3 uV beta activity distributed symmetrically and diffusely.  Hyperventilation and photic stimulation were not performed. Abnormality -Continuous slow, generalized -Excessive beta, generalized IMPRESSION: This study is suggestive of severe to profound diffuse encephalopathy, nonspecific etiology but likely secondary to sedation.  No seizures or epileptiform discharges were seen throughout the recording. Kenneth Sullivan   Overnight EEG with video  Result Date: 03/08/2020 Kenneth Havens, MD     03/08/2020  9:27 AM Patient Name: Kenneth Sullivan MRN: [id number] Epilepsy Attending: Lora Sullivan Referring Physician/Provider: Dr. Marshell Garfinkel Duration: 03/07/2020 1321 to 03/08/2020 0915  Patient history: 64 year old male presented after cardiac arrest.  EEG to evaluate for seizures.  Level of alertness: Comatose  AEDs during EEG study: Propofol  Technical aspects: This EEG study was done with scalp electrodes positioned according to the 10-20 International system of electrode placement. Electrical activity was acquired at a sampling rate of 500Hz  and reviewed with a high frequency filter of 70Hz  and a low frequency filter of 1Hz . EEG data were recorded continuously and digitally stored.  Description: EEG initially showed continuous generalized low amplitude 3-6Hz  theta-delta slowing  with overriding 15 to 18 Hz, 2-3 uV beta activity distributed symmetrically and diffusely. Gradually eeg showed predominantly 4-8hz  generalized theta-alpha activity.  Hyperventilation and photic stimulation were not performed.  Abnormality -Continuous slow, generalized -Excessive beta, generalized  IMPRESSION: This study is suggestive of moderate to severe diffuse encephalopathy, nonspecific etiology but could be secondary to sedation.  No seizures or epileptiform discharges were seen throughout the recording. EEG appeared to be improving throughout the study. Kenneth Sullivan   ECHOCARDIOGRAM COMPLETE  Result Date: 03/07/2020    ECHOCARDIOGRAM REPORT   Patient Name:   Kenneth Sullivan Date of Exam: 03/07/2020 Medical Rec #:  [id number]    Height:       74.0 in Accession #:    [id number]   Weight:       160.0 lb Date of Birth:  [birth year]  BSA:          1.977 m Patient Age:    64 years     BP:           109/89 mmHg Patient Gender: M            HR:           116 bpm. Exam Location:  Inpatient Procedure: 2D Echo, Cardiac Doppler and Color Doppler Indications:    Cardiac arrest I46.9  History:        Patient has no prior history of Echocardiogram examinations.                 Risk Factors:Current Smoker.  Sonographer:    Vickie Epley RDCS Referring Phys: [id number] [facility]  Sonographer Comments: Echo performed with patient supine and on artificial respirator and Technically challenging study due to limited acoustic windows. Image acquisition challenging due to respiratory motion. IMPRESSIONS  1. Very difficult study with poor windows. The RV appears severely dilated with severely reduced function. D shaped septum. Overall, concerns for pulmonary embolism in this patient with cardiac arrest. Clinical correlation is recommended.  2. Left ventricular ejection fraction, by estimation, is 60 to 65%. The left ventricle has normal function. Left ventricular endocardial border not optimally defined to evaluate regional  wall motion. Left ventricular diastolic function could not be evaluated. There is the interventricular septum is flattened in systole and diastole, consistent with right ventricular pressure and volume overload.  3. Right ventricular systolic function is severely reduced. The right ventricular size is severely enlarged.  4. The mitral valve is grossly normal. No evidence of mitral valve regurgitation. No evidence of mitral stenosis.  5. The aortic valve is grossly normal. Aortic valve regurgitation is not visualized. No aortic stenosis is present.  6. Right atrial size was mildly dilated. FINDINGS  Left Ventricle: Left ventricular ejection fraction, by estimation, is 60 to 65%. The left ventricle has normal function. Left ventricular endocardial border not optimally defined to evaluate regional wall motion. The left ventricular internal cavity size was small. There is no left ventricular hypertrophy. The interventricular septum is flattened in systole and diastole, consistent with right ventricular pressure and volume overload. Left ventricular diastolic function could not be evaluated due to nondiagnostic images. Left ventricular diastolic function could not be evaluated. Right Ventricle: The right ventricular size is severely enlarged. No increase in right ventricular wall thickness. Right ventricular systolic function is severely reduced. Left Atrium: Left atrial size was normal in size. Right Atrium: Right atrial size was mildly dilated. Pericardium: There is no evidence of pericardial effusion. Mitral Valve: The mitral valve is grossly normal. No evidence of mitral valve regurgitation. No evidence of mitral valve stenosis. Tricuspid Valve: The tricuspid valve is grossly normal. Tricuspid valve regurgitation is mild . No evidence of tricuspid stenosis. Aortic Valve: The aortic valve is grossly normal. Aortic valve regurgitation is not visualized. No aortic stenosis is present. Pulmonic Valve: The pulmonic valve  was grossly normal. Pulmonic valve regurgitation is not visualized. No evidence of pulmonic stenosis. Aorta: The aortic root is normal in size and structure. Venous: IVC assessment for right atrial pressure unable to be performed due to mechanical ventilation. IAS/Shunts: No atrial level shunt detected by color flow Doppler. Additional Comments: Very difficult study with poor windows. The RV appears severely dilated with severely reduced function. D shaped septum. Overall, concerns for pulmonary embolism in this patient with cardiac arrest. Clinical correlation is recommended.  LEFT VENTRICLE PLAX 2D LVOT  diam:     2.00 cm LVOT Area:     3.14 cm  RIGHT VENTRICLE TAPSE (M-mode): 1.1 cm RIGHT ATRIUM          Index RA Area:     7.97 cm RA Volume:   15.10 ml 7.64 ml/m  MV E velocity: 61.70 cm/s  TRICUSPID VALVE                            TR Peak grad:   25.6 mmHg                            TR Vmax:        253.00 cm/s                             SHUNTS                            Systemic Diam: 2.00 cm Eleonore Chiquito MD Electronically signed by Eleonore Chiquito MD Signature Date/Time: 03/07/2020/9:57:03 AM    Final    IR EMBO ART  VEN HEMORR LYMPH EXTRAV  INC GUIDE ROADMAPPING  Result Date: 03/08/2020 INDICATION: 64 year old male with cardiac arrest, massive PE diagnosis, GI hemorrhage with large gastric ulcer, anti-coagulation is contraindicated. He presents for mesenteric angiogram and possible embolization. Same session PE aspiration thrombectomy. EXAM: US GUIDED ACCESS RIGHT COMMON FEMORAL ARTERY MESENTERIC ANGIOGRAM, CELIAC ARTERY, RIGHT GASTRIC ARTERY, GASTRODUODENAL ARTERY COIL EMBOLIZATION OF RIGHT GASTRIC ARTERY AND GDA EXOSEAL DEPLOYED FOR HEMOSTASIS MEDICATIONS: None ANESTHESIA/SEDATION: Moderate (conscious) sedation was employed during this procedure. A total of Versed 0 mg and Fentanyl 100 mcg was administered intravenously. Moderate Sedation Time: 2 hours 24 minutes minutes. The patient's level of  consciousness and vital signs were monitored continuously by radiology nursing throughout the procedure under my direct supervision. CONTRAST:  330 cc IV contrast. This total reflects the total amount of contrast for 3 separate cases under 1 anesthesia time. FLUOROSCOPY TIME:  Fluoroscopy Time: 31 minutes 12 seconds (1,144 mGy). This fluoroscopy time reflects 3 separate cases performed. COMPLICATIONS: None PROCEDURE: Informed consent was obtained from the patient following explanation of the procedure, risks, benefits and alternatives. The patient understands, agrees and consents for the procedure. All questions were addressed. A time out was performed prior to the initiation of the procedure. Maximal barrier sterile technique utilized including caps, mask, sterile gowns, sterile gloves, large sterile drape, hand hygiene, and Betadine prep. Once the IVC filter was placed and mechanical thrombectomy of the pulmonary arteries was performed, we proceeded with mesenteric angiogram and possible embolization. Ultrasound survey of the right inguinal region was performed with images stored and sent to PACs, confirming patency of the vessel. A micropuncture needle was used access the right common femoral artery under ultrasound. With excellent arterial blood flow returned, and an .018 micro wire was passed through the needle, observed enter the abdominal aorta under fluoroscopy. The needle was removed, and a micropuncture sheath was placed over the wire. The inner dilator and wire were removed, and an 035 Bentson wire was advanced under fluoroscopy into the abdominal aorta. The sheath was removed and a standard 5 [place] vascular sheath was placed. The dilator was removed and the sheath was flushed. A standard C2 Cobra catheter was passed on the Bentson wire used to select the origin of  the celiac artery. Celiac artery origin anatomy did not except a good purchase of these Cobra catheter, and thus the Cobra catheter was  exchanged for a Mickelson catheter. Mickelson catheter achieved adequate purchase of the celiac artery origin. Angiogram was performed. 150 cm STC catheter was advanced with a 14 fathom wire, used to select the common hepatic artery. Fathom wire was advanced into the right gastric artery with the [place] catheter following easily. Angiogram was performed. Coil embolization of the right gastric artery was performed with 2 by 2 mm coils The catheter was then withdrawn and wire was used to select the gastroduodenal artery. Catheter was advanced into the gastroduodenal artery. Angiogram was performed. Coil embolization was then performed of the GDA with combination of 5 mm coils. Final angiogram performed. We then withdrew all catheters wires and deployed Exoseal for hemostasis. Patient remained hemodynamically stable throughout. No complications were encountered and no significant blood loss from this portion of the examination. IMPRESSION: Status post ultrasound guided access right common femoral artery for mesenteric angiogram and empiric coil embolization of right gastric artery and gastroduodenal artery, as the preferential arterial supply to the anatomic location of large gastric ulcer. Signed, Dulcy Fanny. Dellia Nims, RPVI [facility] Radiology Electronically Signed   By: Corrie Mckusick D.O.   On: 03/08/2020 09:33    Cardiac Studies   ECHO  1. Very difficult study with poor windows. The RV appears severely  dilated with severely reduced function. D shaped septum. Overall, concerns  for pulmonary embolism in this patient with cardiac arrest. Clinical  correlation is recommended.  2. Left ventricular ejection fraction, by estimation, is 60 to 65%. The  left ventricle has normal function. Left ventricular endocardial border  not optimally defined to evaluate regional wall motion. Left ventricular  diastolic function could not be  evaluated. There is the  interventricular septum is flattened in systole  and diastole, consistent with right ventricular pressure and volume  overload.  3. Right ventricular systolic function is severely reduced. The right  ventricular size is severely enlarged.  4. The mitral valve is grossly normal. No evidence of mitral valve  regurgitation. No evidence of mitral stenosis.  5. The aortic valve is grossly normal. Aortic valve regurgitation is not  visualized. No aortic stenosis is present.  6. Right atrial size was mildly dilated.   Patient Profile     64 y.o. male with arrest following PE.  No cardiac arrhythmias.  Echo with NL LV function with RV dysfunction/strain.  Now with GI bleed.  Evidence of gastric ulcer and DVT.    Assessment & Plan    Cardiac arrest:  Secondary to PE.  No primary cardiac arrhythmias.    Urine output is down.  BP is soft.  Basically can manage with volume as his issue will be RV dysfunction.  Oxygenation is fine and not at increased risk for pulmonary edema.  Discussed with CCM and nursing.   For questions or updates, please contact [place] Please consult [email or web address] for contact info under Cardiology/STEMI.   Signed, Minus Breeding, MD  03/08/2020, 1:25 PM

## 2020-03-08 NOTE — Procedures (Addendum)
Patient Name: Kenneth Sullivan  MRN: [id number]  Epilepsy Attending: Lora Havens  Referring Physician/Provider: Dr. Marshell Garfinkel Duration: 03/07/2020 1321 to 03/08/2020 1307  Patient history: 64 year old male presented after cardiac arrest.  EEG to evaluate for seizures.  Level of alertness: Comatose  AEDs during EEG study: Propofol  Technical aspects: This EEG study was done with scalp electrodes positioned according to the 10-20 International system of electrode placement. Electrical activity was acquired at a sampling rate of 500Hz  and reviewed with a high frequency filter of 70Hz  and a low frequency filter of 1Hz . EEG data were recorded continuously and digitally stored.   Description: EEG initially showed continuous generalized low amplitude 3-6Hz  theta-delta slowing with overriding 15 to 18 Hz, 2-3 uV beta activity distributed symmetrically and diffusely. Gradually eeg showed predominantly 4-8hz  generalized theta-alpha activity.  Hyperventilation and photic stimulation were not performed.  Abnormality -Continuous slow, generalized -Excessive beta, generalized  IMPRESSION: This study is suggestive of moderate to severe diffuse encephalopathy, nonspecific etiology but could be secondary to sedation.  No seizures or epileptiform discharges were seen throughout the recording.  EEG appeared to be improving throughout the study.   Wil Slape Barbra Sarks

## 2020-03-08 NOTE — Progress Notes (Addendum)
NAME:  Kenneth Sullivan, MRN:  [id number], DOB:  [birth year], LOS: 1 ADMISSION DATE:  03/07/2020, CONSULTATION DATE:  03/07/20 REFERRING MD:  Dr Roxanne Mins MD, CHIEF COMPLAINT:  Cardiac arrest  Brief History   64 year old with no significant past medical history.  Had acute shortness of breath with sats in the 40s on EMS arrival.  He had a witnessed bradycardic, asystolic arrest when EMS was present with 3 rounds of epi, 20 minutes to ROSC. Underwent normothermia protocol.  Work-up significant for massive PE, DVT with RV strain, GI bleed from gastric ulcer.  Past Medical History  Has not seen a doctor.  No past medical history  Smokes 1 pack/day, drinks 2 to 340 ounce beers every day.  Smokes marijuana.  Denies any other recreational drug use.  Significant Hospital Events   3/23-Admit, underwent EGD with findings of oozing gastric ulcer with clot, IR for mechanical thrombectomy, IVC filter and GDA embolization  Consults:  Cardiology, GI, interventional radiology  Procedures:    Significant Diagnostic Tests:  Chest x-ray 3/23-ET tube in stable portion, no acute cardiopulmonary abnormality. CTA 3/23-bilateral PE with RV/with ratio 1.9, emphysema CT head 3/23-chronic microvascular changes.  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 Lower extremity duplex  Micro Data:  Bcx 3/23-negative [name] 3/23-negative  Antimicrobials:    Interim history/subjective:  Stable after IR procedure yesterday.  Remains on low-dose Levophed, minimal vent settings Continues on normothermia protocol  Objective   Blood pressure 124/76, pulse (!) 104, temperature (!) 96.8 F (36 C), temperature source Bladder, resp. rate (!) 22, height 6\' 2"  (1.88 m), weight 73.6 kg, SpO2 100 %.    Vent Mode: PRVC FiO2 (%):  [40 %-100 %] 40 % Set Rate:  [15 bmp] 15 bmp Vt Set:  [650 mL] 650 mL PEEP:  [5 cmH20] 5 cmH20 Plateau Pressure:  [11 cmH20-20  cmH20] 20 cmH20   Intake/Output Summary (Last 24 hours) at 03/08/2020 0836 Last data filed at 03/08/2020 0800 Gross per 24 hour  Intake 3903.75 ml  Output 1215 ml  Net 2688.75 ml   Filed Weights   03/07/20 0810 03/08/20 0419  Weight: 72.6 kg 73.6 kg    Examination: Blood pressure 124/76, pulse (!) 103, temperature (!) 96.8 F (36 C), temperature source Bladder, resp. rate 20, height 6\' 2"  (1.88 m), weight 73.6 kg, SpO2 100 %. Gen:      No acute distress HEENT:  EOMI, sclera anicteric Neck:     No masses; no thyromegaly, ETT Lungs:    Clear to auscultation bilaterally; normal respiratory effort CV:         Regular rate and rhythm; no murmurs Abd:      + bowel sounds; soft, non-tender; no palpable masses, no distension Ext:    No edema; adequate peripheral perfusion Skin:      Warm and dry; no rash Neuro: Sedated  Labs significant for WBC 10, hemoglobin 12.2, platelets 153, INR 1.1 Chest x-ray 3/24-support apparatus in stable position.  No acute abnormality.  Resolved Hospital Problem list     Assessment & Plan:  Acute respiratory failure secondary to massive pulmonary embolism with acute cor pulmonale Continues on Levophed Underwent mechanical thrombectomy and IVC filter yesterday. Discussed with Dr. Earleen Newport from IR- the yield on the thrombectomy was minimal as clot was adherent and he likely has subacute clot Will discuss with surgery if he is a candidate for surgical thrombectomy Start heparin gtt today afternoon without bolus  and traget lower end of therapeutic range.  He is at increased risk for bleed but it is also imperative is to treat PE as he is at high risk for decompensation and repeat cardiac arrest. Risk-benefit discussed in detail with next of kin his niece [name] over telephone and she understands that he remains critically ill with guarded prognosis.   Acute GI bleed, gastric ulcer Not amenable for endoscopic intervention Underwent embolization  yesterday Continue monitoring CBC closely.  If he bleeds then will need surgery for gastrectomy Continue PPI drip.  Cardiac arrest secondary to massive PE Had some purposeful movements yesterday before he got sedated Continue normothermia protocol.  Lower extremity DVT S/p IVC filter.  Can be removed in 3 months if he survives this admission.  Elevated glucose SSI  Best practice:  Diet: NPO Pain/Anxiety/Delirium protocol (if indicated): Propofol, fentanyl as needed.  RASS goal -1 - VAP protocol (if indicated): Ordered DVT prophylaxis: Holding GI prophylaxis: Pepcid Glucose control: SSI Mobility: Bed Code Status: Full Family Communication: Niece updated 3/24 Disposition: ICU   Critical care time:    The patient is critically ill with multiple organ system failure and requires high complexity decision making for assessment and support, frequent evaluation and titration of therapies, advanced monitoring, review of radiographic studies and interpretation of complex data.   Critical Care Time devoted to patient care services, exclusive of separately billable procedures, described in this note is 45 minutes.   Marshell Garfinkel MD [facility] Please see [email or web address] for pager details.  03/08/2020, 8:36 AM

## 2020-03-08 NOTE — Progress Notes (Signed)
ANTICOAGULATION CONSULT NOTE  Pharmacy Consult:  Heparin Indication: pulmonary embolus  No Known Allergies  Patient Measurements: Height: 6\' 2"  (188 cm) Weight: 162 lb 4.1 oz (73.6 kg) IBW/kg (Calculated) : 82.2 Heparin Dosing Weight: 73 kg  Vital Signs: Temp: 95.5 F (35.3 C) (03/24 2200) Temp Source: Bladder (03/24 2200) BP: 99/83 (03/24 2200) Pulse Rate: 110 (03/24 2002)  Labs: Recent Labs    03/07/20 0710 03/07/20 1144 03/07/20 1243 03/07/20 1944 03/07/20 2030 03/07/20 2030 03/07/20 2155 03/07/20 2155 03/08/20 0132 03/08/20 0300 03/08/20 0337 03/08/20 0342 03/08/20 1000 03/08/20 1402 03/08/20 1800 03/08/20 2100  HGB 12.0*   < > 13.3   < > 11.9*   < > 11.8*   < >  --   --  12.0* 12.2*  --   --   --   --   HCT 40.9   < > 41.0   < > 35.4*   < > 36.0*  --   --   --  36.1* 36.0*  --   --   --   --   PLT 100*  --  161  --  154  --  155  --   --   --  153  --   --   --   --   --   APTT  --   --  39*  --   --   --   --   --   --   --   --   --   --   --   --   --   LABPROT  --   --  16.1*  --   --   --   --   --   --   --  14.4  --   --   --   --   --   INR  --   --  1.3*  --   --   --   --   --   --   --  1.1  --   --   --   --   --   HEPARINUNFRC  --   --   --   --   --   --   --   --   --  <0.10*  --   --   --   --   --  0.43  CREATININE 1.19  --  0.90   < >  --    < > 0.79   < >   < >  --  0.77  --    < > 0.79 0.80 0.77  TROPONINIHS 73*  --  8,728*  --   --   --   --   --   --   --   --   --   --   --   --   --    < > = values in this interval not displayed.    Estimated Creatinine Clearance: 99.7 mL/min (by C-G formula based on SCr of 0.77 mg/dL).   Assessment: 60 YOM presented with asystolic arrest from bilateral PEs.  He also has right iliac and iliocaval DVTs.  Patient had maroon stools and bloody output from NG upon arrival to ICU. Upper endoscopy on 03/07/20 showed large cratered oozing gastric ulcer. Went to IR for IVC filter placement and attempted  mechanical aspiration of PE. Underwent US CFA access for mesenteric angiogram as well on 03/07/20.  Patient has a  high risk for bleeding as well as decompensation and repeat cardiac arrest.  Pharmacy consulted to start IV heparin ~24 hours post EGD.  Maroon stools resolved; platelet count normalized.  Will dose heparin conservatively.  Level at goal this evening at 0.4, no bleeding issues noted. Hgb 12.2 this am with pltc 153.   Goal of Therapy: Heparin level 0.3-0.5 units/mL   Plan:  Continue heparin at 1000 units/hr Confirmatory level with am labs  Erin Hearing PharmD., BCPS Clinical Pharmacist 03/08/2020 10:28 PM

## 2020-03-08 NOTE — Progress Notes (Signed)
LR bolus repeated per orders from Dr Percival Spanish  for decreased urine output

## 2020-03-08 NOTE — Progress Notes (Signed)
Patient with continued decreased urine output 10-30 cc/hour. 500 ml LR bolus given per orders

## 2020-03-08 NOTE — Progress Notes (Signed)
Bilateral lower extremity venous duplex exam completed.  Preliminary results given to [place], RN.  Preliminary results can be found under CV proc under chart review.  03/08/2020 2:06 PM  Tagg Eustice, K., RDMS, RVT

## 2020-03-08 NOTE — Consult Note (Addendum)
[facility] ECMO Consult Note   Called to to see Kenneth Sullivan for ECMO Consult at 1100 03/08/2020 by Marshell Garfinkel MD Admitting Diagnosis-submassive pulmonary embolism Primary Issue-shock with RV dysfunction Age:64 y.o. Weight: 73.6 kg  Days on Mechanical Ventilation-2  MAP FiO2 Oxygen Index P/F Ratio  93  0.4  178    Vasopressors norepinephrine 2 mcg/min   MSOF No   Recent Blood Gas:     Component Value Date/Time   PHART 7.393 03/08/2020 0342   PCO2ART 33.5 03/08/2020 0342   PO2ART 78.0 (L) 03/08/2020 0342   HCO3 20.6 03/08/2020 0342   TCO2 22 03/08/2020 0342   ACIDBASEDEF 4.0 (H) 03/08/2020 0342   O2SAT 96.0 03/08/2020 0342    Coags:    Component Value Date/Time   INR 1.1 03/08/2020 0337    CBC    Component Value Date/Time   WBC 10.0 03/08/2020 0337   RBC 3.63 (L) 03/08/2020 0337   HGB 12.2 (L) 03/08/2020 0342   HCT 36.0 (L) 03/08/2020 0342   PLT 153 03/08/2020 0337   MCV 99.4 03/08/2020 0337   MCH 33.1 03/08/2020 0337   MCHC 33.2 03/08/2020 0337   RDW 15.2 03/08/2020 0337   LYMPHSABS 3.0 03/07/2020 0710   MONOABS 0.6 03/07/2020 0710   EOSABS 0.0 03/07/2020 0710   BASOSABS 0.0 03/07/2020 0710    BMET    Component Value Date/Time   NA 136 03/08/2020 1402   K 4.7 03/08/2020 1402   CL 108 03/08/2020 1402   CO2 20 (L) 03/08/2020 1402   GLUCOSE 92 03/08/2020 1402   BUN 6 (L) 03/08/2020 1402   CREATININE 0.79 03/08/2020 1402   CALCIUM 8.0 (L) 03/08/2020 1402   GFRNONAA >60 03/08/2020 1402   GFRAA >60 03/08/2020 1402                                                                                                                                                             ECMO physician Kipp Brood, MD  notified at 1050 (time) Candidate meets ECMO Criteria- No    Additional notes:  64 year old man with no prior significant medical history. Acute shortness of breath with witnessed asystolic arrest lasting 20 minutes. Initiated on TTM with a goal for  normothermia.  CT PE protocol shows bilateral extensive pulmonary emboli throughout both lungs with significant clot burden in the right pulmonary artery.  There was evidence of RV dilatation which was confirmed by echocardiography.  No evidence of intracardiac clot. (Both studies were personally reviewed).  He was heparinized but developed hematemesis.  Heparin was stopped and he underwent underwent endoscopy which revealed a bleeding gastric ulcer.  He was brought to the IR suite where he underwent placement of an IVC filter, gastroduodenal artery embolization and attempted catheter-based thrombectomy.  The latter was not  successful in extracting a significant amount of clot.  Cardiac surgery was consulted regarding surgical thrombectomy.  Dr. Kipp Brood then requested a ECMO evaluation for possible cardiopulmonary support postoperatively.  Past Medical History:  Diagnosis Date  . Cardiac arrest ([place]) 03/07/2020  . Known health problems: none    No recent medical care, has not had a physical in years   Review of Systems  Unable to perform ROS: Medical condition   Social History   Socioeconomic History  . Marital status: Single    Spouse name: Not on file  . Number of children: Not on file  . Years of education: Not on file  . Highest education level: Not on file  Occupational History  . Not on file  Tobacco Use  . Smoking status: Current Every Day Smoker    Packs/day: 1.00    Types: Cigarettes  . Smokeless tobacco: Never Used  Substance and Sexual Activity  . Alcohol use: Yes    Alcohol/week: 21.0 standard drinks    Types: 21 Cans of beer per week    Comment: 2 or 3  -40 ounce beers a day  . Drug use: Yes    Types: Marijuana  . Sexual activity: Not on file  Other Topics Concern  . Not on file  Social History Narrative   Lives with wife.  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   Social Determinants of Health   Financial Resource Strain:   . Difficulty of  Paying Living Expenses:   Food Insecurity:   . Worried About Charity fundraiser in the Last Year:   . Arboriculturist in the Last Year:   Transportation Needs:   . Film/video editor (Medical):   Marland Kitchen Lack of Transportation (Non-Medical):   Physical Activity:   . Days of Exercise per Week:   . Minutes of Exercise per Session:   Stress:   . Feeling of Stress :   Social Connections:   . Frequency of Communication with Friends and Family:   . Frequency of Social Gatherings with Friends and Family:   . Attends Religious Services:   . Active Member of Clubs or Organizations:   . Attends Archivist Meetings:   Marland Kitchen Marital Status:   Intimate Partner Violence:   . Fear of Current or Ex-Partner:   . Emotionally Abused:   Marland Kitchen Physically Abused:   . Sexually Abused:    Family History  Problem Relation Age of Onset  . Heart attack Neg Hx    No current facility-administered medications on file prior to encounter.   No current outpatient medications on file prior to encounter.     On examination:   CVP:  [6 mmHg-14 mmHg] 14 mmHg Blood pressure (!) 93/53, pulse (!) 117, temperature (!) 96.8 F (36 C), temperature source Bladder, resp. rate 20, height 6\' 2"  (1.88 m), weight 73.6 kg, SpO2 92 %.  The patient intubated and sedated on 35 mcg of propofol.  He is a slender man.  Chest clear to auscultation.  Therapeutic temperature management pads in place.  Heart sounds distant.  No peripheral edema.  Abdomen soft and nontender.  Patient sedated.  No response to pain.  ETT/OGT in place.  Personally reviewed EEG tracing which showed diffuse slowing in background overlaid with significant muscle artifact from micro shivering.  Assessment:  Submassive pulmonary embolism with subsequent cardiac arrest. Unknown neurological status would constitute the main contraindication to ECMO support. Despite embolization he does remain a  high bleeding risk which is also a relative  contraindication to ECMO. Furthermore, while the patient is requiring vasopressors, the actual dose is low and can be certainly accounted for by his high sedative requirements for therapeutic temperature management. It is, therefore, not clear that, in spite of RV dysfunction, that the patient is actually in cardiogenic shock requiring ECMO support. Surgical thrombectomy would be very high risk in the presence of RV dysfunction, which is currently well compensated.  It is not clear that the patient would benefit as he has more diffuse clot burden.  At this time there is no indication for initiation of ECMO support, nor would he be a candidate at a later time given his questionable neurological status.  Would recommend continuing with current conservative, evidence-based therapy namely systemic anticoagulation. Attempt to minimize sedatives in order to improve blood pressure.   Kipp Brood Date: 03/08/2020 Time: 3:24 PM

## 2020-03-08 NOTE — Progress Notes (Signed)
Initial Nutrition Assessment RD working remotely.  DOCUMENTATION CODES:   Not applicable  INTERVENTION:   If unable to extubate and when okay to use gut for nutrition, recommend place enteral feeding tube and begin TF:   Vital High Protein at 65 ml/h (1560 ml per day)   Provides 1560 kcal (2014 kcal total with propofol), 137 gm protein, 1304 ml free water daily  If prolonged NPO status expected, consider TPN.  NUTRITION DIAGNOSIS:   Inadequate oral intake related to inability to eat as evidenced by NPO status.  GOAL:   Patient will meet greater than or equal to 90% of their needs  MONITOR:   Vent status, Labs, I & O's  REASON FOR ASSESSMENT:   Ventilator    ASSESSMENT:   64 yo male admitted S/P cardiac arrest, S/P normothermia protocol. Found to have massive PE, DVT, GI bleed from gastric ulcer. PMH includes current smoker, heavy alcohol use.   S/P embolectomy of oozing gastric ulcer, IVC filter placement on admission 3/23.   Patient currently has no enteral access. OG tube was removed 3/23 to reduce trauma to gastric ulcer.   Patient is currently intubated on ventilator support MV: 16.1 L/min Temp (24hrs), Avg:95.6 F (35.3 C), Min:93.2 F (34 C), Max:97.2 F (36.2 C)  Propofol: 17.2 ml/hr providing 454 kcal from lipid   Labs reviewed.  CBG's: [id number]  Medications reviewed and include levophed, propofol, protonix, novolog.  IVF:  LR at 100 ml/h NS at 20 ml/h  Unsure of usual weight or nutrition history.  NUTRITION - FOCUSED PHYSICAL EXAM:  unable to complete  Diet Order:   Diet Order            Diet NPO time specified  Diet effective now              EDUCATION NEEDS:   Not appropriate for education at this time  Skin:  Skin Assessment: Reviewed RN Assessment(incisions to neck and groin)  Last BM:  3/24  Height:   Ht Readings from Last 1 Encounters:  03/07/20 6\' 2"  (1.88 m)    Weight:   Wt Readings from Last 1 Encounters:   03/08/20 73.6 kg    Ideal Body Weight:  86.4 kg  BMI:  Body mass index is 20.83 kg/m.  Estimated Nutritional Needs:   Kcal:  2015  Protein:  110-125 gm  Fluid:  >/= 2 L    Molli Barrows, RD, LDN, CNSC Please refer to Amion for contact information.

## 2020-03-08 NOTE — Progress Notes (Signed)
ANTICOAGULATION CONSULT NOTE  Pharmacy Consult:  Heparin Indication: pulmonary embolus  No Known Allergies  Patient Measurements: Height: 6\' 2"  (188 cm) Weight: 162 lb 4.1 oz (73.6 kg) IBW/kg (Calculated) : 82.2 Heparin Dosing Weight: 73 kg  Vital Signs: Temp: 96.8 F (36 C) (03/24 0800) Temp Source: Bladder (03/24 0800) BP: 124/76 (03/24 0829) Pulse Rate: 106 (03/24 0900)  Labs: Recent Labs    03/07/20 0710 03/07/20 1144 03/07/20 1243 03/07/20 1944 03/07/20 2030 03/07/20 2030 03/07/20 2155 03/07/20 2155 03/08/20 0132 03/08/20 0300 03/08/20 0337 03/08/20 0342  HGB 12.0*   < > 13.3   < > 11.9*   < > 11.8*   < >  --   --  12.0* 12.2*  HCT 40.9   < > 41.0   < > 35.4*   < > 36.0*  --   --   --  36.1* 36.0*  PLT 100*  --  161  --  154  --  155  --   --   --  153  --   APTT  --   --  39*  --   --   --   --   --   --   --   --   --   LABPROT  --   --  16.1*  --   --   --   --   --   --   --  14.4  --   INR  --   --  1.3*  --   --   --   --   --   --   --  1.1  --   HEPARINUNFRC  --   --   --   --   --   --   --   --   --  <0.10*  --   --   CREATININE 1.19  --  0.90   < >  --    < > 0.79  --  0.78  --  0.77  --   TROPONINIHS 73*  --  8,728*  --   --   --   --   --   --   --   --   --    < > = values in this interval not displayed.    Estimated Creatinine Clearance: 99.7 mL/min (by C-G formula based on SCr of 0.77 mg/dL).   Assessment: 25 YOM presented with asystolic arrest from bilateral PEs.  He also has right iliac and iliocaval DVTs.  Patient had maroon stools and bloody output from NG upon arrival to ICU. Upper endoscopy on 03/07/20 showed large cratered oozing gastric ulcer. Went to IR for IVC filter placement and attempted mechanical aspiration of PE. Underwent US CFA access for mesenteric angiogram as well on 03/07/20.  Patient has a high risk for bleeding as well as decompensation and repeat cardiac arrest.  Pharmacy consulted to start IV heparin ~24 hours post  EGD.  Maroon stools resolved; platelet count normalized.  Will dose heparin conservatively.  Goal of Therapy: Heparin level 0.3-0.5 units/mL   Plan:  At 1500 today, start IV heparin at 1000 units/hr, no bolus Check 6 hr heparin level Daily heparin level and CBC Monitor closely for s/sx of bleeding  Dollie Mayse D. Mina Marble, PharmD, BCPS, [place] 03/08/2020, 9:05 AM

## 2020-03-08 NOTE — Progress Notes (Signed)
[facility] Gastroenterology Progress Note  Kenneth Sullivan 64 y.o. 1956/02/02   Subjective: Green stool. S/P embolization of the right gastric artery and gastroduodenal artery. Intubated.  Objective: Vital signs: Vitals:   03/08/20 0829 03/08/20 0830  BP: 124/76   Pulse: (!) 104 (!) 103  Resp: (!) 22 20  Temp:    SpO2: 100% 100%  T 96.8  Physical Exam: Gen: intubated, sedated, elderly  CV: Tachy reg rhythm Chest: CTA B Abd: decreased bowel sounds, unable to assess tenderness Ext: no edema  Lab Results: Recent Labs    03/08/20 0132 03/08/20 0132 03/08/20 0337 03/08/20 0342  NA 136   < > 138 136  K 4.0   < > 3.9 3.7  CL 109  --  109  --   CO2 20*  --  21*  --   GLUCOSE 115*  --  96  --   BUN 8  --  7*  --   CREATININE 0.78  --  0.77  --   CALCIUM 7.7*  --  7.9*  --   MG  --   --  2.1  --   PHOS  --   --  3.3  --    < > = values in this interval not displayed.   Recent Labs    03/07/20 0710 03/08/20 0337  AST 60* 154*  ALT 29 93*  ALKPHOS 55 69  BILITOT 0.6 0.3  PROT 4.4* 4.9*  ALBUMIN 1.9* 2.0*   Recent Labs    03/07/20 0710 03/07/20 1144 03/07/20 2155 03/07/20 2155 03/08/20 0337 03/08/20 0342  WBC 7.1   < > 10.8*  --  10.0  --   NEUTROABS 3.3  --   --   --   --   --   HGB 12.0*   < > 11.8*   < > 12.0* 12.2*  HCT 40.9   < > 36.0*   < > 36.1* 36.0*  MCV 111.7*   < > 100.0  --  99.4  --   PLT 100*   < > 155  --  153  --    < > = values in this interval not displayed.      Assessment/Plan: Upper GI bleed with a large complicated gastric ulcer (deep cratered with adherent clots and oozing of blood seen on EGD not amenable to endoscopic treatment). Successful embolization of the GDA and gastric artery. Attempted thrombectomy for biliateral PEs with cardiac arrest and resuscitation yesterday. Heparin on hold. Continue Protonix drip. If rebleeding occurs, then will need surgery. Supportive care. Will follow.   Kenneth Sullivan 03/08/2020, 8:44  AM  Questions please call [phone] ID: Kenneth Sullivan, male   DOB: Oct 07, 1956, 64 y.o.   MRN: [id number]

## 2020-03-08 NOTE — Progress Notes (Signed)
Referring Physician(s): Dr. Vaughan Browner  Supervising Physician: Arne Cleveland  Patient Status:  [facility] - In-pt  Chief Complaint: Cardiac arrest  Subjective: Intubated and sedated.  Allergies: Patient has no known allergies.  Medications: Prior to Admission medications   Not on File     Vital Signs: BP 98/77   Pulse (!) 102   Temp (!) 97.2 F (36.2 C) (Bladder)   Resp (!) 21   Ht 6\' 2"  (1.88 m)   Wt 162 lb 4.1 oz (73.6 kg)   SpO2 100%   BMI 20.83 kg/m   Physical Exam  Intubated and sedated Artic sun protocol with device in place Groin soft, no evidence of hematoma or pseudoaneurysm.  Imaging: CT Head Wo Contrast  Result Date: 03/07/2020 CLINICAL DATA:  64 year old male with history of altered mental status. EXAM: CT HEAD WITHOUT CONTRAST TECHNIQUE: Contiguous axial images were obtained from the base of the skull through the vertex without intravenous contrast. COMPARISON:  No priors. FINDINGS: Brain: Patchy areas of decreased attenuation are noted throughout the deep and periventricular white matter of the cerebral hemispheres bilaterally, compatible with chronic microvascular ischemic disease. no evidence of acute infarction, hemorrhage, hydrocephalus, extra-axial collection or mass lesion/mass effect. Vascular: No hyperdense vessel or unexpected calcification. Skull: Normal. Negative for fracture or focal lesion. Sinuses/Orbits: No acute finding. Other: Endotracheal and nasogastric tubes are incompletely imaged. IMPRESSION: 1. No acute intracranial abnormalities. 2. Mild chronic microvascular ischemic changes in the cerebral white matter, as above. Electronically Signed   By: Vinnie Langton M.D.   On: 03/07/2020 10:56   CT ANGIO CHEST PE W OR WO CONTRAST  Result Date: 03/07/2020 CLINICAL DATA:  Cardiac arrest, intubated EXAM: CT ANGIOGRAPHY CHEST WITH CONTRAST TECHNIQUE: Multidetector CT imaging of the chest was performed using the standard protocol during bolus  administration of intravenous contrast. Multiplanar CT image reconstructions and MIPs were obtained to evaluate the vascular anatomy. CONTRAST:  80 cc OMNIPAQUE IOHEXOL 350 MG/ML SOLN COMPARISON:  03/07/2020 FINDINGS: Cardiovascular: This is a technically adequate evaluation of the pulmonary vasculature. There are bilateral pulmonary emboli right greater than left. There is straightening of the interventricular septum and dilatation of the right ventricle compatible with right heart strain. 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 The thoracic aorta demonstrates normal caliber with no aneurysm or dissection. No pericardial effusion. Mediastinum/Nodes: No enlarged mediastinal, hilar, or axillary lymph nodes. Thyroid gland, trachea, and esophagus demonstrate no significant findings. Endotracheal tube identified well above carina. Enteric catheter extends into the gastric lumen. Lungs/Pleura: Scattered upper lobe predominant emphysematous changes are seen. Patchy subpleural consolidation superior segment right lower lobe is nonspecific. No effusion or pneumothorax. Mild lower lobe predominant bronchiectasis. The central airways are patent. Upper Abdomen: Rounded cystic structure left upper quadrant likely partially visualized renal cyst. This measures up to 7.3 cm in diameter. Musculoskeletal: No acute or destructive bony lesions. Reconstructed images demonstrate no additional findings. Review of the MIP images confirms the above findings. IMPRESSION: 1. Bilateral pulmonary emboli. Positive for acute PE with CT evidence of right heart strain (RV/LV Ratio = 1.9) consistent with at least submassive (intermediate risk) PE. The presence of right heart strain has been associated with an increased risk of morbidity and mortality. 2. Emphysema.  Mild bronchiectasis bilaterally. 3. Support devices as above. 4. 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 These results were called by telephone at the time of interpretation on  03/07/2020 at 11:16 a.m. to provider [facility] , who verbally acknowledged these results. Electronically Signed   By: Randa Ngo M.D.   On: 03/07/2020 11:20   IR Angiogram Visceral Selective  Result Date: 03/08/2020 INDICATION: 64 year old male with cardiac arrest, massive PE diagnosis, GI hemorrhage with large gastric ulcer, anti-coagulation is contraindicated. He presents for mesenteric angiogram and possible embolization. Same session PE aspiration thrombectomy. EXAM: US GUIDED ACCESS RIGHT COMMON FEMORAL ARTERY MESENTERIC ANGIOGRAM, CELIAC ARTERY, RIGHT GASTRIC ARTERY, GASTRODUODENAL ARTERY COIL EMBOLIZATION OF RIGHT GASTRIC ARTERY AND GDA EXOSEAL DEPLOYED FOR HEMOSTASIS MEDICATIONS: None ANESTHESIA/SEDATION: Moderate (conscious) sedation was employed during this procedure. A total of Versed 0 mg and Fentanyl 100 mcg was administered intravenously. Moderate Sedation Time: 2 hours 24 minutes minutes. The patient's level of consciousness and vital signs were monitored continuously by radiology nursing throughout the procedure under my direct supervision. CONTRAST:  330 cc IV contrast. This total reflects the total amount of contrast for 3 separate cases under 1 anesthesia time. FLUOROSCOPY TIME:  Fluoroscopy Time: 31 minutes 12 seconds (1,144 mGy). This fluoroscopy time reflects 3 separate cases performed. COMPLICATIONS: None PROCEDURE: Informed consent was obtained from the patient following explanation of the procedure, risks, benefits and alternatives. The patient understands, agrees and consents for the procedure. All questions were addressed. A time out was performed prior to the initiation of the procedure. Maximal barrier sterile technique utilized including caps, mask, sterile gowns, sterile gloves, large sterile drape, hand hygiene, and Betadine prep. Once the  IVC filter was placed and mechanical thrombectomy of the pulmonary arteries was performed, we proceeded with mesenteric angiogram and possible embolization. Ultrasound survey of the right inguinal region was performed with images stored and sent to PACs, confirming patency of the vessel. A micropuncture needle was used access the right common femoral artery under ultrasound. With excellent arterial blood flow returned, and an .018 micro wire was passed through the needle, observed enter the abdominal aorta under fluoroscopy. The needle was removed, and a micropuncture sheath was placed over the wire. The inner dilator and wire were removed, and an 035 Bentson wire was advanced under fluoroscopy into the abdominal aorta. The sheath was removed and a standard 5 [place] vascular sheath was placed. The dilator was removed and the sheath was flushed. A standard C2 Cobra catheter was passed on the Bentson wire used to select the origin of the celiac artery. Celiac artery origin anatomy did not except a good purchase of these Cobra catheter, and thus the Cobra catheter was exchanged for a Mickelson catheter. Mickelson catheter achieved adequate purchase of the celiac artery origin. Angiogram was performed. 150 cm STC catheter was advanced with a 14 fathom wire, used to select the common hepatic artery. Fathom wire was advanced into the right gastric artery with the [place] catheter following easily. Angiogram was performed. Coil embolization of the right gastric artery was performed with 2 by 2 mm coils The catheter was then withdrawn and wire was used to select the gastroduodenal artery. Catheter was advanced into the gastroduodenal artery. Angiogram was performed. Coil embolization was then performed of the GDA with combination of 5 mm coils. Final angiogram performed. We then withdrew all catheters wires and deployed Exoseal for hemostasis. Patient remained hemodynamically stable throughout. No complications were encountered  and no significant blood loss from this portion of the examination. IMPRESSION: Status post ultrasound guided access right common femoral artery for mesenteric angiogram and empiric coil embolization of right gastric artery and gastroduodenal artery,  as the preferential arterial supply to the anatomic location of large gastric ulcer. Signed, Dulcy Fanny. Dellia Nims, RPVI [facility] Electronically Signed   By: Corrie Mckusick D.O.   On: 03/08/2020 09:33   IR Angiogram Selective Each Additional Vessel  Result Date: 03/08/2020 INDICATION: 64 year old male with cardiac arrest, massive PE diagnosis, GI hemorrhage with large gastric ulcer, anti-coagulation is contraindicated. He presents for mesenteric angiogram and possible embolization. Same session PE aspiration thrombectomy. EXAM: US GUIDED ACCESS RIGHT COMMON FEMORAL ARTERY MESENTERIC ANGIOGRAM, CELIAC ARTERY, RIGHT GASTRIC ARTERY, GASTRODUODENAL ARTERY COIL EMBOLIZATION OF RIGHT GASTRIC ARTERY AND GDA EXOSEAL DEPLOYED FOR HEMOSTASIS MEDICATIONS: None ANESTHESIA/SEDATION: Moderate (conscious) sedation was employed during this procedure. A total of Versed 0 mg and Fentanyl 100 mcg was administered intravenously. Moderate Sedation Time: 2 hours 24 minutes minutes. The patient's level of consciousness and vital signs were monitored continuously by radiology nursing throughout the procedure under my direct supervision. CONTRAST:  330 cc IV contrast. This total reflects the total amount of contrast for 3 separate cases under 1 anesthesia time. FLUOROSCOPY TIME:  Fluoroscopy Time: 31 minutes 12 seconds (1,144 mGy). This fluoroscopy time reflects 3 separate cases performed. COMPLICATIONS: None PROCEDURE: Informed consent was obtained from the patient following explanation of the procedure, risks, benefits and alternatives. The patient understands, agrees and consents for the procedure. All questions were addressed. A  time out was performed prior to the initiation of the procedure. Maximal barrier sterile technique utilized including caps, mask, sterile gowns, sterile gloves, large sterile drape, hand hygiene, and Betadine prep. Once the IVC filter was placed and mechanical thrombectomy of the pulmonary arteries was performed, we proceeded with mesenteric angiogram and possible embolization. Ultrasound survey of the right inguinal region was performed with images stored and sent to PACs, confirming patency of the vessel. A micropuncture needle was used access the right common femoral artery under ultrasound. With excellent arterial blood flow returned, and an .018 micro wire was passed through the needle, observed enter the abdominal aorta under fluoroscopy. The needle was removed, and a micropuncture sheath was placed over the wire. The inner dilator and wire were removed, and an 035 Bentson wire was advanced under fluoroscopy into the abdominal aorta. The sheath was removed and a standard 5 [place] vascular sheath was placed. The dilator was removed and the sheath was flushed. A standard C2 Cobra catheter was passed on the Bentson wire used to select the origin of the celiac artery. Celiac artery origin anatomy did not except a good purchase of these Cobra catheter, and thus the Cobra catheter was exchanged for a Mickelson catheter. Mickelson catheter achieved adequate purchase of the celiac artery origin. Angiogram was performed. 150 cm STC catheter was advanced with a 14 fathom wire, used to select the common hepatic artery. Fathom wire was advanced into the right gastric artery with the [place] catheter following easily. Angiogram was performed. Coil embolization of the right gastric artery was performed with 2 by 2 mm coils The catheter was then withdrawn and wire was used to select the gastroduodenal artery. Catheter was advanced into the gastroduodenal artery. Angiogram was performed. Coil embolization was then performed of  the GDA with combination of 5 mm coils. Final angiogram performed. We then withdrew all catheters wires and deployed Exoseal for hemostasis. Patient remained hemodynamically stable throughout. No complications were encountered and no significant blood loss from this portion of the examination. IMPRESSION: Status post ultrasound guided access right common femoral artery  for mesenteric angiogram and empiric coil embolization of right gastric artery and gastroduodenal artery, as the preferential arterial supply to the anatomic location of large gastric ulcer. Signed, Dulcy Fanny. Dellia Nims, RPVI [facility] Electronically Signed   By: Corrie Mckusick D.O.   On: 03/08/2020 09:33   IR Angiogram Selective Each Additional Vessel  Result Date: 03/08/2020 INDICATION: 64 year old male with cardiac arrest, massive PE diagnosis, GI hemorrhage with large gastric ulcer, anti-coagulation is contraindicated. He presents for mesenteric angiogram and possible embolization. Same session PE aspiration thrombectomy. EXAM: US GUIDED ACCESS RIGHT COMMON FEMORAL ARTERY MESENTERIC ANGIOGRAM, CELIAC ARTERY, RIGHT GASTRIC ARTERY, GASTRODUODENAL ARTERY COIL EMBOLIZATION OF RIGHT GASTRIC ARTERY AND GDA EXOSEAL DEPLOYED FOR HEMOSTASIS MEDICATIONS: None ANESTHESIA/SEDATION: Moderate (conscious) sedation was employed during this procedure. A total of Versed 0 mg and Fentanyl 100 mcg was administered intravenously. Moderate Sedation Time: 2 hours 24 minutes minutes. The patient's level of consciousness and vital signs were monitored continuously by radiology nursing throughout the procedure under my direct supervision. CONTRAST:  330 cc IV contrast. This total reflects the total amount of contrast for 3 separate cases under 1 anesthesia time. FLUOROSCOPY TIME:  Fluoroscopy Time: 31 minutes 12 seconds (1,144 mGy). This fluoroscopy time reflects 3 separate cases performed. COMPLICATIONS: None  PROCEDURE: Informed consent was obtained from the patient following explanation of the procedure, risks, benefits and alternatives. The patient understands, agrees and consents for the procedure. All questions were addressed. A time out was performed prior to the initiation of the procedure. Maximal barrier sterile technique utilized including caps, mask, sterile gowns, sterile gloves, large sterile drape, hand hygiene, and Betadine prep. Once the IVC filter was placed and mechanical thrombectomy of the pulmonary arteries was performed, we proceeded with mesenteric angiogram and possible embolization. Ultrasound survey of the right inguinal region was performed with images stored and sent to PACs, confirming patency of the vessel. A micropuncture needle was used access the right common femoral artery under ultrasound. With excellent arterial blood flow returned, and an .018 micro wire was passed through the needle, observed enter the abdominal aorta under fluoroscopy. The needle was removed, and a micropuncture sheath was placed over the wire. The inner dilator and wire were removed, and an 035 Bentson wire was advanced under fluoroscopy into the abdominal aorta. The sheath was removed and a standard 5 [place] vascular sheath was placed. The dilator was removed and the sheath was flushed. A standard C2 Cobra catheter was passed on the Bentson wire used to select the origin of the celiac artery. Celiac artery origin anatomy did not except a good purchase of these Cobra catheter, and thus the Cobra catheter was exchanged for a Mickelson catheter. Mickelson catheter achieved adequate purchase of the celiac artery origin. Angiogram was performed. 150 cm STC catheter was advanced with a 14 fathom wire, used to select the common hepatic artery. Fathom wire was advanced into the right gastric artery with the [place] catheter following easily. Angiogram was performed. Coil embolization of the right gastric artery was performed  with 2 by 2 mm coils The catheter was then withdrawn and wire was used to select the gastroduodenal artery. Catheter was advanced into the gastroduodenal artery. Angiogram was performed. Coil embolization was then performed of the GDA with combination of 5 mm coils. Final angiogram performed. We then withdrew all catheters wires and deployed Exoseal for hemostasis. Patient remained hemodynamically stable throughout. No complications were encountered and no significant blood loss from this  portion of the examination. IMPRESSION: Status post ultrasound guided access right common femoral artery for mesenteric angiogram and empiric coil embolization of right gastric artery and gastroduodenal artery, as the preferential arterial supply to the anatomic location of large gastric ulcer. Signed, Dulcy Fanny. Dellia Nims, RPVI [facility] Electronically Signed   By: Corrie Mckusick D.O.   On: 03/08/2020 09:33   IR Angiogram Selective Each Additional Vessel  Result Date: 03/08/2020 INDICATION: 64 year old male with cardiac arrest, massive PE diagnosis, GI hemorrhage with large gastric ulcer, anti-coagulation is contraindicated. He presents for mesenteric angiogram and possible embolization. Same session PE aspiration thrombectomy. EXAM: US GUIDED ACCESS RIGHT COMMON FEMORAL ARTERY MESENTERIC ANGIOGRAM, CELIAC ARTERY, RIGHT GASTRIC ARTERY, GASTRODUODENAL ARTERY COIL EMBOLIZATION OF RIGHT GASTRIC ARTERY AND GDA EXOSEAL DEPLOYED FOR HEMOSTASIS MEDICATIONS: None ANESTHESIA/SEDATION: Moderate (conscious) sedation was employed during this procedure. A total of Versed 0 mg and Fentanyl 100 mcg was administered intravenously. Moderate Sedation Time: 2 hours 24 minutes minutes. The patient's level of consciousness and vital signs were monitored continuously by radiology nursing throughout the procedure under my direct supervision. CONTRAST:  330 cc IV contrast. This total reflects  the total amount of contrast for 3 separate cases under 1 anesthesia time. FLUOROSCOPY TIME:  Fluoroscopy Time: 31 minutes 12 seconds (1,144 mGy). This fluoroscopy time reflects 3 separate cases performed. COMPLICATIONS: None PROCEDURE: Informed consent was obtained from the patient following explanation of the procedure, risks, benefits and alternatives. The patient understands, agrees and consents for the procedure. All questions were addressed. A time out was performed prior to the initiation of the procedure. Maximal barrier sterile technique utilized including caps, mask, sterile gowns, sterile gloves, large sterile drape, hand hygiene, and Betadine prep. Once the IVC filter was placed and mechanical thrombectomy of the pulmonary arteries was performed, we proceeded with mesenteric angiogram and possible embolization. Ultrasound survey of the right inguinal region was performed with images stored and sent to PACs, confirming patency of the vessel. A micropuncture needle was used access the right common femoral artery under ultrasound. With excellent arterial blood flow returned, and an .018 micro wire was passed through the needle, observed enter the abdominal aorta under fluoroscopy. The needle was removed, and a micropuncture sheath was placed over the wire. The inner dilator and wire were removed, and an 035 Bentson wire was advanced under fluoroscopy into the abdominal aorta. The sheath was removed and a standard 5 [place] vascular sheath was placed. The dilator was removed and the sheath was flushed. A standard C2 Cobra catheter was passed on the Bentson wire used to select the origin of the celiac artery. Celiac artery origin anatomy did not except a good purchase of these Cobra catheter, and thus the Cobra catheter was exchanged for a Mickelson catheter. Mickelson catheter achieved adequate purchase of the celiac artery origin. Angiogram was performed. 150 cm STC catheter was advanced with a 14 fathom  wire, used to select the common hepatic artery. Fathom wire was advanced into the right gastric artery with the [place] catheter following easily. Angiogram was performed. Coil embolization of the right gastric artery was performed with 2 by 2 mm coils The catheter was then withdrawn and wire was used to select the gastroduodenal artery. Catheter was advanced into the gastroduodenal artery. Angiogram was performed. Coil embolization was then performed of the GDA with combination of 5 mm coils. Final angiogram performed. We then withdrew all catheters wires and deployed Exoseal for hemostasis. Patient remained  hemodynamically stable throughout. No complications were encountered and no significant blood loss from this portion of the examination. IMPRESSION: Status post ultrasound guided access right common femoral artery for mesenteric angiogram and empiric coil embolization of right gastric artery and gastroduodenal artery, as the preferential arterial supply to the anatomic location of large gastric ulcer. Signed, Dulcy Fanny. Dellia Nims, RPVI [facility] Electronically Signed   By: Corrie Mckusick D.O.   On: 03/08/2020 09:33   IR IVC FILTER PLMT / S&I Burke Keels GUID/MOD SED  Result Date: 03/08/2020 INDICATION: 64 year old male with cardiac arrest, massive PE diagnosis, GI hemorrhage with large gastric ulcer, anti-coagulation is contraindicated. He presents for IVC filter placement, same session PE mechanical/aspiration thrombectomy, and mesenteric angiogram with embolization. EXAM: US GUIDED RIGHT COMMON FEMORAL VEIN ACCESS VENOGRAM IVC FILTER PLACEMENT MEDICATIONS: None. ANESTHESIA/SEDATION: 0 mg IV Versed; 100 mcg IV Fentanyl Moderate Sedation Time:  0 minutes The patient was continuously monitored during the procedure by the interventional radiology nurse under my direct supervision. In addition the patient was intubated with respiratory teen involved with his  care. FLUOROSCOPY TIME:  Fluoroscopy Time: 31 minutes 12 seconds (1,144 mGy). This fluoroscopy time reflects 3 separate cases performed, with separate dictation COMPLICATIONS: None PROCEDURE: The procedure, risks, benefits, and alternatives were explained to the patient. Specific risks discussed include bleeding, infection, contrast reaction, renal failure, IVC filter fracture, migration, iliocaval thrombus (3-4% incidence), need for further procedure, need for further surgery, pulmonary embolism, cardiopulmonary collapse, death. Questions regarding the procedure were encouraged and answered. The patient understands and consents to the procedure. Ultrasound survey was performed with images stored and sent to PACs. The right inguinal region was prepped with chlorhexidine in a sterile fashion, and a sterile drape was applied covering the operative field. A sterile gown and sterile gloves were used for the procedure. Local anesthesia was provided with 1% Lidocaine. We elected to start with access of the right common femoral vein, given our forthcoming attempt at pulmonary embolism thrombectomy with the [facility] device. A micropuncture needle was used access the right common femoral vein under ultrasound. With excellent venous blood flow returned, and an .018 micro wire was passed through the needle. Small incision was made with an 11 blade scalpel. The needle was removed, and a micropuncture sheath was placed over the wire. The inner dilator and wire were removed, and an 035 Bentson wire was advanced under fluoroscopy into the IVC. Standard 5 French sheath was placed. Venogram was performed. The right jugular region was prepped and draped in the usual sterile fashion. 1% lidocaine was used for local anesthesia. A micropuncture needle was used access the right internal jugular vein under ultrasound. With excellent venous blood flow returned, and an .018 micro wire was passed through the needle. Small incision was made with  an 11 blade scalpel. The needle was removed, and a micropuncture sheath was placed over the wire. The inner dilator and wire were removed, and an 035 Bentson wire was advanced under fluoroscopy into the IVC. Serial dilation of the soft tissue tract was performed with an 8 [place] dilator and subsequently a 10 [place] dilator. The delivery sheath for a retrievable Bard Denali filter was passed over the Bentson wire into the IVC. The wire was removed and small contrast was used to confirm IVC location. IVC cavagram performed. Dilator was removed, and the IVC filter was then delivered, positioned below the lowest renal vein. Repeat cavagram performed, and the catheter was removed. After  placement of the filter, we proceeded with serial dilation of the IJ access site, for placement of 29F proprietary Gore dry-seal sheath. Patient tolerated the procedure well and remained hemodynamically stable throughout. No complications were encountered and no significant blood loss was encounter. IMPRESSION: Status post ultrasound guided access right common femoral vein with venogram confirming ilio caval thrombus. Status post ultrasound guided access right internal jugular vein with placement of retrievable IVC filter. We then proceeded with pulmonary angiogram and mechanical/aspiration thrombectomy. This will be dictated under a separate dictation Signed, Dulcy Fanny. 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 PLAN: This IVC filter is potentially retrievable. The patient will be assessed for filter retrieval by Interventional Radiology in approximately 8-12 weeks. Further recommendations regarding filter retrieval, continued surveillance or declaration of device permanence, will be made at that time. Electronically Signed   By: Corrie Mckusick D.O.   On: 03/08/2020 09:27     IR US Guide Vasc Access Right  Result Date: 03/08/2020 INDICATION: 64 year old male with cardiac arrest, massive PE diagnosis, GI hemorrhage with large gastric ulcer, anti-coagulation is contraindicated. He presents for mesenteric angiogram and possible embolization. Same session PE aspiration thrombectomy. EXAM: US GUIDED ACCESS RIGHT COMMON FEMORAL ARTERY MESENTERIC ANGIOGRAM, CELIAC ARTERY, RIGHT GASTRIC ARTERY, GASTRODUODENAL ARTERY COIL EMBOLIZATION OF RIGHT GASTRIC ARTERY AND GDA EXOSEAL DEPLOYED FOR HEMOSTASIS MEDICATIONS: None ANESTHESIA/SEDATION: Moderate (conscious) sedation was employed during this procedure. A total of Versed 0 mg and Fentanyl 100 mcg was administered intravenously. Moderate Sedation Time: 2 hours 24 minutes minutes. The patient's level of consciousness and vital signs were monitored continuously by radiology nursing throughout the procedure under my direct supervision. CONTRAST:  330 cc IV contrast. This total reflects the total amount of contrast for 3 separate cases under 1 anesthesia time. FLUOROSCOPY TIME:  Fluoroscopy Time: 31 minutes 12 seconds (1,144 mGy). This fluoroscopy time reflects 3 separate cases performed. COMPLICATIONS: None PROCEDURE: Informed consent was obtained from the patient following explanation of the procedure, risks, benefits and alternatives. The patient understands, agrees and consents for the procedure. All questions were addressed. A time out was performed prior to the initiation of the procedure. Maximal barrier sterile technique utilized including caps, mask, sterile gowns, sterile gloves, large sterile drape, hand hygiene, and Betadine prep. Once the IVC filter was placed and mechanical thrombectomy of the pulmonary arteries was performed, we proceeded with mesenteric angiogram and possible embolization. Ultrasound survey of the right inguinal region was performed with images stored and sent to PACs, confirming patency of the vessel. A  micropuncture needle was used access the right common femoral artery under ultrasound. With excellent arterial blood flow returned, and an .018 micro wire was passed through the needle, observed enter the abdominal aorta under fluoroscopy. The needle was removed, and a micropuncture sheath was placed over the wire. The inner dilator and wire were removed, and an 035 Bentson wire was advanced under fluoroscopy into the abdominal aorta. The sheath was removed and a standard 5 [place] vascular sheath was placed. The dilator was removed and the sheath was flushed. A standard C2 Cobra catheter was passed on the Bentson wire used to select the origin of the celiac artery. Celiac artery origin anatomy did not except a good purchase of these Cobra catheter, and thus the Cobra catheter was exchanged for a Mickelson catheter. Mickelson catheter achieved adequate purchase of the  celiac artery origin. Angiogram was performed. 150 cm STC catheter was advanced with a 14 fathom wire, used to select the common hepatic artery. Fathom wire was advanced into the right gastric artery with the [place] catheter following easily. Angiogram was performed. Coil embolization of the right gastric artery was performed with 2 by 2 mm coils The catheter was then withdrawn and wire was used to select the gastroduodenal artery. Catheter was advanced into the gastroduodenal artery. Angiogram was performed. Coil embolization was then performed of the GDA with combination of 5 mm coils. Final angiogram performed. We then withdrew all catheters wires and deployed Exoseal for hemostasis. Patient remained hemodynamically stable throughout. No complications were encountered and no significant blood loss from this portion of the examination. IMPRESSION: Status post ultrasound guided access right common femoral artery for mesenteric angiogram and empiric coil embolization of right gastric artery and gastroduodenal artery, as the preferential arterial supply to  the anatomic location of large gastric ulcer. Signed, Dulcy Fanny. Dellia Nims, RPVI Vascular and Interventional Radiology Specialists [facility] Electronically Signed   By: Corrie Mckusick D.O.   On: 03/08/2020 09:33   IR US Guide Vasc Access Right  Result Date: 03/08/2020 INDICATION: 64 year old male with cardiac arrest, massive PE diagnosis, GI hemorrhage with large gastric ulcer, anti-coagulation is contraindicated. He presents for IVC filter placement, same session PE mechanical/aspiration thrombectomy, and mesenteric angiogram with embolization. EXAM: US GUIDED RIGHT COMMON FEMORAL VEIN ACCESS VENOGRAM IVC FILTER PLACEMENT MEDICATIONS: None. ANESTHESIA/SEDATION: 0 mg IV Versed; 100 mcg IV Fentanyl Moderate Sedation Time:  0 minutes The patient was continuously monitored during the procedure by the interventional radiology nurse under my direct supervision. In addition the patient was intubated with respiratory teen involved with his care. FLUOROSCOPY TIME:  Fluoroscopy Time: 31 minutes 12 seconds (1,144 mGy). This fluoroscopy time reflects 3 separate cases performed, with separate dictation COMPLICATIONS: None PROCEDURE: The procedure, risks, benefits, and alternatives were explained to the patient. Specific risks discussed include bleeding, infection, contrast reaction, renal failure, IVC filter fracture, migration, iliocaval thrombus (3-4% incidence), need for further procedure, need for further surgery, pulmonary embolism, cardiopulmonary collapse, death. Questions regarding the procedure were encouraged and answered. The patient understands and consents to the procedure. Ultrasound survey was performed with images stored and sent to PACs. The right inguinal region was prepped with chlorhexidine in a sterile fashion, and a sterile drape was applied covering the operative field. A sterile gown and sterile gloves were used for the procedure. Local anesthesia was provided with 1% Lidocaine. We elected  to start with access of the right common femoral vein, given our forthcoming attempt at pulmonary embolism thrombectomy with the [facility] device. A micropuncture needle was used access the right common femoral vein under ultrasound. With excellent venous blood flow returned, and an .018 micro wire was passed through the needle. Small incision was made with an 11 blade scalpel. The needle was removed, and a micropuncture sheath was placed over the wire. The inner dilator and wire were removed, and an 035 Bentson wire was advanced under fluoroscopy into the IVC. Standard 5 French sheath was placed. Venogram was performed. The right jugular region was prepped and draped in the usual sterile fashion. 1% lidocaine was used for local anesthesia. A micropuncture needle was used access the right internal jugular vein under ultrasound. With excellent venous blood flow returned, and an .018 micro wire was passed through the needle. Small incision was made with an 11 blade scalpel. The needle was  removed, and a micropuncture sheath was placed over the wire. The inner dilator and wire were removed, and an 035 Bentson wire was advanced under fluoroscopy into the IVC. Serial dilation of the soft tissue tract was performed with an 8 [place] dilator and subsequently a 10 [place] dilator. The delivery sheath for a retrievable Bard Denali filter was passed over the Bentson wire into the IVC. The wire was removed and small contrast was used to confirm IVC location. IVC cavagram performed. Dilator was removed, and the IVC filter was then delivered, positioned below the lowest renal vein. Repeat cavagram performed, and the catheter was removed. After placement of the filter, we proceeded with serial dilation of the IJ access site, for placement of 32F proprietary Gore dry-seal sheath. Patient tolerated the procedure well and remained hemodynamically stable throughout. No complications were encountered and no significant blood loss was  encounter. IMPRESSION: Status post ultrasound guided access right common femoral vein with venogram confirming ilio caval thrombus. Status post ultrasound guided access right internal jugular vein with placement of retrievable IVC filter. We then proceeded with pulmonary angiogram and mechanical/aspiration thrombectomy. This will be dictated under a separate dictation Signed, Dulcy Fanny. 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 PLAN: This IVC filter is potentially retrievable. The patient will be assessed for filter retrieval by Interventional Radiology in approximately 8-12 weeks. Further recommendations regarding filter retrieval, continued surveillance or declaration of device permanence, will be made at that time. Electronically Signed   By: Corrie Mckusick D.O.   On: 03/08/2020 09:27   DG Chest Port 1 View  Result Date: 03/08/2020 CLINICAL DATA:  Acute respiratory failure EXAM: PORTABLE CHEST 1 VIEW COMPARISON:  None. FINDINGS: The heart size and mediastinal contours are within normal limits. ETT is 1.7 cm above the carina. A left-sided PICC is seen with the tip at the lower SVC. The lungs are clear. No focal airspace consolidation or pleural effusion. No acute osseous abnormality. Both lungs are clear. The visualized skeletal structures are unremarkable. IMPRESSION: No active disease. Electronically Signed   By: Prudencio Pair M.D.   On: 03/08/2020 06:09   DG Chest Port 1 View  Result Date: 03/07/2020 CLINICAL DATA:  Central line placement EXAM: PORTABLE CHEST 1 VIEW COMPARISON:  03/07/2020 at 0654 hours FINDINGS: Interval placement of a left subclavian approach central venous catheter with distal tip terminating at the level of the distal SVC. ET tube terminates 2.5 cm superior to the carina. Enteric tube courses below the diaphragm with  distal tip and side hole beyond the inferior margin of the film. Normal heart size. No focal airspace consolidation, pleural effusion, or pneumothorax. IMPRESSION: Interval placement of a left subclavian approach central venous catheter with distal tip terminating at the level of the distal SVC. No pneumothorax. Electronically Signed   By: Davina Poke D.O.   On: 03/07/2020 13:19   DG Chest Portable 1 View  Result Date: 03/07/2020 CLINICAL DATA:  Intubation.  Respiratory distress EXAM: PORTABLE CHEST 1 VIEW COMPARISON:  None. FINDINGS: Endotracheal tube tip is just below the clavicular heads. Normal heart size and mediastinal contours. There is no edema, consolidation, effusion, or pneumothorax. IMPRESSION: 1. Unremarkable endotracheal tube positioning. 2. No visible cardiopulmonary disease. Electronically Signed   By: Monte Fantasia M.D.   On: 03/07/2020 07:11   EEG adult  Result Date: 03/07/2020 Lora Havens, MD  03/07/2020  1:45 PM Patient Name: [facility] MRN: [id number] Epilepsy Attending: Lora Havens Referring Physician/Provider: Dr. Marshell Garfinkel Date: 03/07/2020 Duration: 22.25 minutes Patient history: 64 year old male presented after cardiac arrest.  EEG to evaluate for seizures. Level of alertness: Comatose AEDs during EEG study: Propofol Technical aspects: This EEG study was done with scalp electrodes positioned according to the 10-20 International system of electrode placement. Electrical activity was acquired at a sampling rate of 500Hz  and reviewed with a high frequency filter of 70Hz  and a low frequency filter of 1Hz . EEG data were recorded continuously and digitally stored. Description: EEG showed continuous generalized low amplitude 3-6Hz  theta-delta slowing with overriding 15 to 18 Hz, 2-3 uV beta activity distributed symmetrically and diffusely.  Hyperventilation and photic stimulation were not performed. Abnormality -Continuous slow, generalized -Excessive beta,  generalized IMPRESSION: This study is suggestive of severe to profound diffuse encephalopathy, nonspecific etiology but likely secondary to sedation.  No seizures or epileptiform discharges were seen throughout the recording. Priyanka Barbra Sarks   Overnight EEG with video  Result Date: 03/08/2020 Lora Havens, MD     03/08/2020  2:30 PM Patient Name: [facility] MRN: [id number] Epilepsy Attending: Lora Havens Referring Physician/Provider: Dr. Marshell Garfinkel Duration: 03/07/2020 1321 to 03/08/2020 1307  Patient history: 64 year old male presented after cardiac arrest.  EEG to evaluate for seizures.  Level of alertness: Comatose  AEDs during EEG study: Propofol  Technical aspects: This EEG study was done with scalp electrodes positioned according to the 10-20 International system of electrode placement. Electrical activity was acquired at a sampling rate of 500Hz  and reviewed with a high frequency filter of 70Hz  and a low frequency filter of 1Hz . EEG data were recorded continuously and digitally stored.  Description: EEG initially showed continuous generalized low amplitude 3-6Hz  theta-delta slowing with overriding 15 to 18 Hz, 2-3 uV beta activity distributed symmetrically and diffusely. Gradually eeg showed predominantly 4-8hz  generalized theta-alpha activity.  Hyperventilation and photic stimulation were not performed.  Abnormality -Continuous slow, generalized -Excessive beta, generalized  IMPRESSION: This study is suggestive of moderate to severe diffuse encephalopathy, nonspecific etiology but could be secondary to sedation.  No seizures or epileptiform discharges were seen throughout the recording. EEG appeared to be improving throughout the study. Lora Havens   ECHOCARDIOGRAM COMPLETE  Result Date: 03/07/2020    ECHOCARDIOGRAM REPORT   Patient Name:   Kenneth [facility] Date of Exam: 03/07/2020 Medical Rec #:  [id number]    Height:       74.0 in Accession #:    [id number]   Weight:       160.0  lb Date of Birth:  01-24-1956    BSA:          1.977 m Patient Age:    64 years     BP:           109/89 mmHg Patient Gender: M            HR:           116 bpm. Exam Location:  Inpatient Procedure: 2D Echo, Cardiac Doppler and Color Doppler Indications:    Cardiac arrest I46.9  History:        Patient has no prior history of Echocardiogram examinations.                 Risk Factors:Current Smoker.  Sonographer:    Vickie Epley RDCS Referring Phys: [id number] [facility]  Sonographer Comments: Echo performed with patient supine and on  artificial respirator and Technically challenging study due to limited acoustic windows. Image acquisition challenging due to respiratory motion. IMPRESSIONS  1. Very difficult study with poor windows. The RV appears severely dilated with severely reduced function. D shaped septum. Overall, concerns for pulmonary embolism in this patient with cardiac arrest. Clinical correlation is recommended.  2. Left ventricular ejection fraction, by estimation, is 60 to 65%. The left ventricle has normal function. Left ventricular endocardial border not optimally defined to evaluate regional wall motion. Left ventricular diastolic function could not be evaluated. There is the interventricular septum is flattened in systole and diastole, consistent with right ventricular pressure and volume overload.  3. Right ventricular systolic function is severely reduced. The right ventricular size is severely enlarged.  4. The mitral valve is grossly normal. No evidence of mitral valve regurgitation. No evidence of mitral stenosis.  5. The aortic valve is grossly normal. Aortic valve regurgitation is not visualized. No aortic stenosis is present.  6. Right atrial size was mildly dilated. FINDINGS  Left Ventricle: Left ventricular ejection fraction, by estimation, is 60 to 65%. The left ventricle has normal function. Left ventricular endocardial border not optimally defined to evaluate regional wall motion. The  left ventricular internal cavity size was small. There is no left ventricular hypertrophy. The interventricular septum is flattened in systole and diastole, consistent with right ventricular pressure and volume overload. Left ventricular diastolic function could not be evaluated due to nondiagnostic images. Left ventricular diastolic function could not be evaluated. Right Ventricle: The right ventricular size is severely enlarged. No increase in right ventricular wall thickness. Right ventricular systolic function is severely reduced. Left Atrium: Left atrial size was normal in size. Right Atrium: Right atrial size was mildly dilated. Pericardium: There is no evidence of pericardial effusion. Mitral Valve: The mitral valve is grossly normal. No evidence of mitral valve regurgitation. No evidence of mitral valve stenosis. Tricuspid Valve: The tricuspid valve is grossly normal. Tricuspid valve regurgitation is mild . No evidence of tricuspid stenosis. Aortic Valve: The aortic valve is grossly normal. Aortic valve regurgitation is not visualized. No aortic stenosis is present. Pulmonic Valve: The pulmonic valve was grossly normal. Pulmonic valve regurgitation is not visualized. No evidence of pulmonic stenosis. Aorta: The aortic root is normal in size and structure. Venous: IVC assessment for right atrial pressure unable to be performed due to mechanical ventilation. IAS/Shunts: No atrial level shunt detected by color flow Doppler. Additional Comments: Very difficult study with poor windows. The RV appears severely dilated with severely reduced function. D shaped septum. Overall, concerns for pulmonary embolism in this patient with cardiac arrest. Clinical correlation is recommended.  LEFT VENTRICLE PLAX 2D LVOT diam:     2.00 cm LVOT Area:     3.14 cm  RIGHT VENTRICLE TAPSE (M-mode): 1.1 cm RIGHT ATRIUM          Index RA Area:     7.97 cm RA Volume:   15.10 ml 7.64 ml/m  MV E velocity: 61.70 cm/s  TRICUSPID VALVE                             TR Peak grad:   25.6 mmHg                            TR Vmax:        253.00 cm/s  SHUNTS                            Systemic Diam: 2.00 cm Eleonore Chiquito MD Electronically signed by Eleonore Chiquito MD Signature Date/Time: 03/07/2020/9:57:03 AM    Final    IR EMBO ART  VEN HEMORR LYMPH EXTRAV  INC GUIDE ROADMAPPING  Result Date: 03/08/2020 INDICATION: 64 year old male with cardiac arrest, massive PE diagnosis, GI hemorrhage with large gastric ulcer, anti-coagulation is contraindicated. He presents for mesenteric angiogram and possible embolization. Same session PE aspiration thrombectomy. EXAM: US GUIDED ACCESS RIGHT COMMON FEMORAL ARTERY MESENTERIC ANGIOGRAM, CELIAC ARTERY, RIGHT GASTRIC ARTERY, GASTRODUODENAL ARTERY COIL EMBOLIZATION OF RIGHT GASTRIC ARTERY AND GDA EXOSEAL DEPLOYED FOR HEMOSTASIS MEDICATIONS: None ANESTHESIA/SEDATION: Moderate (conscious) sedation was employed during this procedure. A total of Versed 0 mg and Fentanyl 100 mcg was administered intravenously. Moderate Sedation Time: 2 hours 24 minutes minutes. The patient's level of consciousness and vital signs were monitored continuously by radiology nursing throughout the procedure under my direct supervision. CONTRAST:  330 cc IV contrast. This total reflects the total amount of contrast for 3 separate cases under 1 anesthesia time. FLUOROSCOPY TIME:  Fluoroscopy Time: 31 minutes 12 seconds (1,144 mGy). This fluoroscopy time reflects 3 separate cases performed. COMPLICATIONS: None PROCEDURE: Informed consent was obtained from the patient following explanation of the procedure, risks, benefits and alternatives. The patient understands, agrees and consents for the procedure. All questions were addressed. A time out was performed prior to the initiation of the procedure. Maximal barrier sterile technique utilized including caps, mask, sterile gowns, sterile gloves, large sterile drape, hand  hygiene, and Betadine prep. Once the IVC filter was placed and mechanical thrombectomy of the pulmonary arteries was performed, we proceeded with mesenteric angiogram and possible embolization. Ultrasound survey of the right inguinal region was performed with images stored and sent to PACs, confirming patency of the vessel. A micropuncture needle was used access the right common femoral artery under ultrasound. With excellent arterial blood flow returned, and an .018 micro wire was passed through the needle, observed enter the abdominal aorta under fluoroscopy. The needle was removed, and a micropuncture sheath was placed over the wire. The inner dilator and wire were removed, and an 035 Bentson wire was advanced under fluoroscopy into the abdominal aorta. The sheath was removed and a standard 5 [place] vascular sheath was placed. The dilator was removed and the sheath was flushed. A standard C2 Cobra catheter was passed on the Bentson wire used to select the origin of the celiac artery. Celiac artery origin anatomy did not except a good purchase of these Cobra catheter, and thus the Cobra catheter was exchanged for a Mickelson catheter. Mickelson catheter achieved adequate purchase of the celiac artery origin. Angiogram was performed. 150 cm STC catheter was advanced with a 14 fathom wire, used to select the common hepatic artery. Fathom wire was advanced into the right gastric artery with the [place] catheter following easily. Angiogram was performed. Coil embolization of the right gastric artery was performed with 2 by 2 mm coils The catheter was then withdrawn and wire was used to select the gastroduodenal artery. Catheter was advanced into the gastroduodenal artery. Angiogram was performed. Coil embolization was then performed of the GDA with combination of 5 mm coils. Final angiogram performed. We then withdrew all catheters wires and deployed Exoseal for hemostasis. Patient remained hemodynamically stable  throughout. No complications were encountered and no significant blood loss  from this portion of the examination. IMPRESSION: Status post ultrasound guided access right common femoral artery for mesenteric angiogram and empiric coil embolization of right gastric artery and gastroduodenal artery, as the preferential arterial supply to the anatomic location of large gastric ulcer. Signed, Dulcy Fanny. Dellia Nims, RPVI Vascular and Interventional Radiology Specialists [facility] Radiology Electronically Signed   By: Corrie Mckusick D.O.   On: 03/08/2020 09:33   VAS [place] LOWER EXTREMITY VENOUS (DVT)  Result Date: 03/08/2020  Lower Venous DVTStudy Indications: Swelling.  Limitations: Poor ultrasound/tissue interface, bandages, line and arctic sun device. Comparison Study: IR procedure 03-07-20, positive IVC and right iliac vein. Performing Technologist: Baldwin Crown ARDMS, RVT  Examination Guidelines: A complete evaluation includes B-mode imaging, spectral Doppler, color Doppler, and power Doppler as needed of all accessible portions of each vessel. Bilateral testing is considered an integral part of a complete examination. Limited examinations for reoccurring indications may be performed as noted. The reflux portion of the exam is performed with the patient in reverse Trendelenburg.  +---------+---------------+---------+-----------+----------+--------------+ RIGHT    CompressibilityPhasicitySpontaneityPropertiesThrombus Aging +---------+---------------+---------+-----------+----------+--------------+ CFV      None           No       No                   Acute          +---------+---------------+---------+-----------+----------+--------------+ SFJ      None                                                        +---------+---------------+---------+-----------+----------+--------------+ FV Prox                                               Not visualized  +---------+---------------+---------+-----------+----------+--------------+ FV Mid                                                Not visualized +---------+---------------+---------+-----------+----------+--------------+ FV Distal                                             Not visualized +---------+---------------+---------+-----------+----------+--------------+ PFV                                                   Not visualized +---------+---------------+---------+-----------+----------+--------------+ POP      None           No       No                                  +---------+---------------+---------+-----------+----------+--------------+ PTV      None                                                        +---------+---------------+---------+-----------+----------+--------------+  PERO     None                                                        +---------+---------------+---------+-----------+----------+--------------+   +---------+---------------+---------+-----------+----------+--------------+ LEFT     CompressibilityPhasicitySpontaneityPropertiesThrombus Aging +---------+---------------+---------+-----------+----------+--------------+ CFV      None           No       No                   Acute          +---------+---------------+---------+-----------+----------+--------------+ SFJ                                                   Not visualized +---------+---------------+---------+-----------+----------+--------------+ FV Prox                                               Not visualized +---------+---------------+---------+-----------+----------+--------------+ FV Mid                                                Not visualized +---------+---------------+---------+-----------+----------+--------------+ FV Distal                                             Not visualized  +---------+---------------+---------+-----------+----------+--------------+ PFV                                                   Not visualized +---------+---------------+---------+-----------+----------+--------------+ POP      None           No       No                                  +---------+---------------+---------+-----------+----------+--------------+ PTV      None                                                        +---------+---------------+---------+-----------+----------+--------------+ PERO     None                                                        +---------+---------------+---------+-----------+----------+--------------+ Gastroc  None                                                        +---------+---------------+---------+-----------+----------+--------------+  CIV                                                   Not visualized +---------+---------------+---------+-----------+----------+--------------+ EIV                                                   Not visualized +---------+---------------+---------+-----------+----------+--------------+ Unable to visualize bilateral femoral and iliac veins due to arctic sun device. Poorly visualized bilateral calf veins.    Summary: RIGHT: - Findings consistent with acute deep vein thrombosis involving the right common femoral vein, SF junction, right popliteal vein, right posterior tibial veins, and right peroneal veins. However, portions of this examination were limited- see technologist  comments above.  LEFT: - Findings consistent with acute deep vein thrombosis involving the left common femoral vein, left popliteal vein, left posterior tibial veins, and left peroneal veins. However, portions of this examination were limited- see technologist comments above.  *See table(s) above for measurements and observations.    Preliminary     Labs:  CBC: Recent Labs    03/07/20 1243  03/07/20 1949 03/07/20 2030 03/07/20 2155 03/08/20 0337 03/08/20 0342  WBC 13.1*  --  10.8* 10.8* 10.0  --   HGB 13.3   < > 11.9* 11.8* 12.0* 12.2*  HCT 41.0   < > 35.4* 36.0* 36.1* 36.0*  PLT 161  --  154 155 153  --    < > = values in this interval not displayed.    COAGS: Recent Labs    03/07/20 1243 03/08/20 0337  INR 1.3* 1.1  APTT 39*  --     BMP: Recent Labs    03/08/20 0132 03/08/20 0132 03/08/20 0337 03/08/20 0342 03/08/20 1000 03/08/20 1402  NA 136   < > 138 136 136 136  K 4.0   < > 3.9 3.7 3.8 4.7  CL 109  --  109  --  108 108  CO2 20*  --  21*  --  20* 20*  GLUCOSE 115*  --  96  --  101* 92  BUN 8  --  7*  --  5* 6*  CALCIUM 7.7*  --  7.9*  --  8.0* 8.0*  CREATININE 0.78  --  0.77  --  0.78 0.79  GFRNONAA >60  --  >60  --  >60 >60  GFRAA >60  --  >60  --  >60 >60   < > = values in this interval not displayed.    LIVER FUNCTION TESTS: Recent Labs    03/07/20 0710 03/08/20 0337  BILITOT 0.6 0.3  AST 60* 154*  ALT 29 93*  ALKPHOS 55 69  PROT 4.4* 4.9*  ALBUMIN 1.9* 2.0*    Assessment and Plan: Cardiac arrest due to massive PE s/p thrombectomy S/p IVC filter placement Thrombectomy attempted overnight. Discussed procedure with Dr. Earleen Newport. Clot adherent to artery wall and difficult to remove.  Suspect residual burden remains.  Plans to initiate heparin with close monitoring of GI bleeding.  IVC filter placed as well.  LE dopplers show acute DVT in R common femoral, SF junction, right popliteal, right posterior tibial, and right peroneal veins.  Acute DVT in the left common femoral,  left popliteal, left posterior tibial, and left peroneal.   Active GI bleed s/p mesenteric angiogram with embolization of right gastric artery and GDA.  Flexiseal in place with dark liquid. NGT has been removed. HgB 12.2, SCr 0.79 Unable to assess procedure site due to artic sun equipment. VSS. Weaned from pressors.  No hematemesis.   IR following.     Electronically Signed: Docia Barrier, PA 03/08/2020, 2:47 PM   I spent a total of 15 Minutes at the the patient's bedside AND on the patient's [facility] floor or unit, greater than 50% of which was counseling/coordinating care for GI bleed, massive PE.

## 2020-03-08 NOTE — Progress Notes (Signed)
[facility]Link Physician-Brief Progress Note Patient Name: Kenneth [facility] DOB: [birth year] MRN: [id number]   Date of Service  03/08/2020  HPI/Events of Note  Pt  With ventilator dyssynchrony not responsive to PRN Fentanyl, Pt is on a Propofol infusion.  eICU Interventions  Versed 2 mg iv x 1 was given with persistence of dyssynchrony despite trial of pressure control and pressure support, a second dose of versed 2 mg + 7 mg of Vecuronium was given to try to reset his ventilatory pattern.        [name] [name] 03/08/2020, 8:04 PM

## 2020-03-08 NOTE — Progress Notes (Signed)
LTM discontinued; no skin breakdown was seen. Notified [facility] of D/C. 

## 2020-03-09 ENCOUNTER — Inpatient Hospital Stay (HOSPITAL_COMMUNITY): Payer: Medicaid Other

## 2020-03-09 DIAGNOSIS — I469 Cardiac arrest, cause unspecified: Secondary | ICD-10-CM | POA: Diagnosis not present

## 2020-03-09 LAB — CBC
HCT: 30.5 % — ABNORMAL LOW (ref 39.0–52.0)
HCT: 32.5 % — ABNORMAL LOW (ref 39.0–52.0)
Hemoglobin: 9.9 g/dL — ABNORMAL LOW (ref 13.0–17.0)
Hemoglobin: 10.4 g/dL — ABNORMAL LOW (ref 13.0–17.0)
MCH: 32.7 pg (ref 26.0–34.0)
MCH: 32.3 pg (ref 26.0–34.0)
MCHC: 32.5 g/dL (ref 30.0–36.0)
MCHC: 32 g/dL (ref 30.0–36.0)
MCV: 100.7 fL — ABNORMAL HIGH (ref 80.0–100.0)
MCV: 100.9 fL — ABNORMAL HIGH (ref 80.0–100.0)
Platelets: 141 10*3/uL — ABNORMAL LOW (ref 150–400)
Platelets: 153 10*3/uL (ref 150–400)
RBC: 3.03 MIL/uL — ABNORMAL LOW (ref 4.22–5.81)
RBC: 3.22 MIL/uL — ABNORMAL LOW (ref 4.22–5.81)
RDW: 15.7 % — ABNORMAL HIGH (ref 11.5–15.5)
RDW: 15.8 % — ABNORMAL HIGH (ref 11.5–15.5)
WBC: 10.6 10*3/uL — ABNORMAL HIGH (ref 4.0–10.5)
WBC: 10.7 10*3/uL — ABNORMAL HIGH (ref 4.0–10.5)
nRBC: 0 % (ref 0.0–0.2)
nRBC: 0 % (ref 0.0–0.2)

## 2020-03-09 LAB — POCT I-STAT 7, (LYTES, BLD GAS, ICA,H+H)
Acid-base deficit: 4 mmol/L — ABNORMAL HIGH (ref 0.0–2.0)
Acid-base deficit: 5 mmol/L — ABNORMAL HIGH (ref 0.0–2.0)
Bicarbonate: 20.4 mmol/L (ref 20.0–28.0)
Bicarbonate: 20.6 mmol/L (ref 20.0–28.0)
Calcium, Ion: 1.21 mmol/L (ref 1.15–1.40)
Calcium, Ion: 1.22 mmol/L (ref 1.15–1.40)
HCT: 33 % — ABNORMAL LOW (ref 39.0–52.0)
HCT: 35 % — ABNORMAL LOW (ref 39.0–52.0)
Hemoglobin: 11.2 g/dL — ABNORMAL LOW (ref 13.0–17.0)
Hemoglobin: 11.9 g/dL — ABNORMAL LOW (ref 13.0–17.0)
O2 Saturation: 87 %
O2 Saturation: 97 %
Patient temperature: 36.2
Patient temperature: 98.6
Potassium: 4.1 mmol/L (ref 3.5–5.1)
Potassium: 4.1 mmol/L (ref 3.5–5.1)
Sodium: 134 mmol/L — ABNORMAL LOW (ref 135–145)
Sodium: 135 mmol/L (ref 135–145)
TCO2: 22 mmol/L (ref 22–32)
TCO2: 22 mmol/L (ref 22–32)
pCO2 arterial: 34.8 mmHg (ref 32.0–48.0)
pCO2 arterial: 39.7 mmHg (ref 32.0–48.0)
pH, Arterial: 7.323 — ABNORMAL LOW (ref 7.350–7.450)
pH, Arterial: 7.374 (ref 7.350–7.450)
pO2, Arterial: 56 mmHg — ABNORMAL LOW (ref 83.0–108.0)
pO2, Arterial: 90 mmHg (ref 83.0–108.0)

## 2020-03-09 LAB — BASIC METABOLIC PANEL
Anion gap: 9 (ref 5–15)
BUN: 8 mg/dL (ref 8–23)
CO2: 20 mmol/L — ABNORMAL LOW (ref 22–32)
Calcium: 7.9 mg/dL — ABNORMAL LOW (ref 8.9–10.3)
Chloride: 106 mmol/L (ref 98–111)
Creatinine, Ser: 0.77 mg/dL (ref 0.61–1.24)
GFR calc Af Amer: >60 mL/min (ref >60)
GFR calc non Af Amer: >60 mL/min (ref >60)
Glucose, Bld: 95 mg/dL (ref 70–99)
Potassium: 4 mmol/L (ref 3.5–5.1)
Sodium: 135 mmol/L (ref 135–145)

## 2020-03-09 LAB — GLUCOSE, CAPILLARY
Glucose-Capillary: 101 mg/dL — ABNORMAL HIGH (ref 70–99)
Glucose-Capillary: 70 mg/dL (ref 70–99)
Glucose-Capillary: 89 mg/dL (ref 70–99)
Glucose-Capillary: 57 mg/dL — ABNORMAL LOW (ref 70–99)
Glucose-Capillary: 87 mg/dL (ref 70–99)
Glucose-Capillary: 64 mg/dL — ABNORMAL LOW (ref 70–99)
Glucose-Capillary: 116 mg/dL — ABNORMAL HIGH (ref 70–99)
Glucose-Capillary: 73 mg/dL (ref 70–99)

## 2020-03-09 LAB — HEPARIN LEVEL (UNFRACTIONATED)
Heparin Unfractionated: 0.5 IU/mL (ref 0.30–0.70)
Heparin Unfractionated: 0.37 IU/mL (ref 0.30–0.70)

## 2020-03-09 LAB — MAGNESIUM: Magnesium: 1.8 mg/dL (ref 1.7–2.4)

## 2020-03-09 LAB — LACTIC ACID, PLASMA: Lactic Acid, Venous: 1 mmol/L (ref 0.5–1.9)

## 2020-03-09 LAB — PHOSPHORUS: Phosphorus: 3.6 mg/dL (ref 2.5–4.6)

## 2020-03-09 LAB — TRIGLYCERIDES: Triglycerides: 122 mg/dL (ref <150)

## 2020-03-09 MED ORDER — DEXTROSE 50 % IV SOLN: 12.5000 g | INTRAVENOUS | Status: DC

## 2020-03-09 MED ORDER — LACTATED RINGERS IV BOLUS
500.0000 mL | Freq: Once | INTRAVENOUS | Status: AC
  Administered 2020-03-09: 500 mL via INTRAVENOUS

## 2020-03-09 MED ORDER — DEXTROSE 50 % IV SOLN
12.5000 g | INTRAVENOUS | Status: AC
  Administered 2020-03-09: 12.5 g via INTRAVENOUS
  Filled 2020-03-09: qty 50

## 2020-03-09 MED ORDER — DEXTROSE 50 % IV SOLN: 1.0000 | Freq: Once | INTRAVENOUS | Status: DC

## 2020-03-09 MED ORDER — LACTATED RINGERS IV BOLUS
1000.0000 mL | Freq: Once | INTRAVENOUS | Status: AC
  Administered 2020-03-09: 1000 mL via INTRAVENOUS

## 2020-03-09 MED ORDER — DEXTROSE 50 % IV SOLN
INTRAVENOUS | Status: AC
  Administered 2020-03-09: 25 mL
  Filled 2020-03-09: qty 50

## 2020-03-09 NOTE — Progress Notes (Signed)
eLink Physician-Brief Progress Note Patient Name: Kenneth [facility] DOB: 12-Sep-1956 MRN: [id number]   Date of Service  03/09/2020  HPI/Events of Note  Hypotension and low urine output  eICU Interventions  LR 500 ml iv fluid bolus x 1        [name] U [name] 03/09/2020, 12:57 AM

## 2020-03-09 NOTE — Progress Notes (Signed)
[facility] Gastroenterology Progress Note  Oluwatobiloba Ginnetti 64 y.o. 23-Nov-1956   Subjective: No bleeding overnight. Intubated, sedated.  Objective: Vital signs: Vitals:   03/09/20 0800 03/09/20 0815  BP: 105/84 120/66  Pulse: 98 (!) 102  Resp: 15 16  Temp: 99.1 F (37.3 C)   SpO2: 100% 100%    Physical Exam: Gen: intubated, sedated  HEENT: anicteric sclera CV: RRR Chest: CTA B Abd: unable to assess tenderness with abdominal temp apparatus on Ext: no edema  Lab Results: Recent Labs    03/08/20 0337 03/08/20 0342 03/08/20 2100 03/08/20 2100 03/09/20 0025 03/09/20 0300  NA 138   < > 135   < > 135 135  K 3.9   < > 4.0   < > 4.1 4.0  CL 109   < > 107  --   --  106  CO2 21*   < > 19*  --   --  20*  GLUCOSE 96   < > 91  --   --  95  BUN 7*   < > 7*  --   --  8  CREATININE 0.77   < > 0.77  --   --  0.77  CALCIUM 7.9*   < > 8.1*  --   --  7.9*  MG 2.1  --   --   --   --  1.8  PHOS 3.3  --   --   --   --  3.6   < > = values in this interval not displayed.   Recent Labs    03/07/20 0710 03/08/20 0337  AST 60* 154*  ALT 29 93*  ALKPHOS 55 69  BILITOT 0.6 0.3  PROT 4.4* 4.9*  ALBUMIN 1.9* 2.0*   Recent Labs    03/07/20 0710 03/07/20 1144 03/08/20 0337 03/08/20 0342 03/09/20 0025 03/09/20 0300  WBC 7.1   < > 10.0  --   --  10.6*  NEUTROABS 3.3  --   --   --   --   --   HGB 12.0*   < > 12.0*   < > 11.2* 9.9*  HCT 40.9   < > 36.1*   < > 33.0* 30.5*  MCV 111.7*   < > 99.4  --   --  100.7*  PLT 100*   < > 153  --   --  141*   < > = values in this interval not displayed.      Assessment/Plan: Cardiac arrest on arrival to hospital; Upper GI bleed from large distal gastric ulcer s/p embolization without further bleeding. On IV Heparin for bilateral PEs - s/p thrombectomy and IVC filter placement. Continue Protonix infusion. Would avoid any gastric tubes for 2 weeks from admit and avoid enteral feeding during this time as well and use TNA/TPN if necessary. No further  GI recs. Will sign off. Call us back if questions arise.   Lear Ng 03/09/2020, 9:27 AM  Questions please call [phone] ID: Obed Metzen, male   DOB: [birth year], 64 y.o.   MRN: [id number]

## 2020-03-09 NOTE — Progress Notes (Signed)
Contacted [facility] due to concern in a rather quick change in hemodynamics. Pt had been off all pressors since about 2130. Around midnight it was noted that pts BP trending was down and so Levophed drip was restarted to maintain MAP goals.  At midnight CVP was 8, which is down from 12 at the start of the shift. Urine output has remained low since day shift, for which the pt received 1.5L of NS with day shift.  Contacted [facility], awaiting orders. Will continue to monitor.

## 2020-03-09 NOTE — Progress Notes (Addendum)
NAME:  Kenneth [facility], MRN:  [id number], DOB:  1956/01/29, LOS: 2 ADMISSION DATE:  03/07/2020, CONSULTATION DATE:  03/07/20 REFERRING MD:  Dr Roxanne Mins MD, CHIEF COMPLAINT:  Cardiac arrest  Brief History   64 year old with no significant past medical history.  Had acute shortness of breath with sats in the 40s on EMS arrival.  He had a witnessed bradycardic, asystolic arrest when EMS was present with 3 rounds of epi, 20 minutes to ROSC. Underwent normothermia protocol.  Work-up significant for massive PE, DVT with RV strain, GI bleed from gastric ulcer.  Past Medical History  Has not seen a doctor.  No past medical history  Smokes 1 pack/day, drinks 2 to 340 ounce beers every day.  Smokes marijuana.  Denies any other recreational drug use.  Significant Hospital Events   3/23-Admit, underwent EGD with findings of oozing gastric ulcer with clot, IR for mechanical thrombectomy, IVC filter and GDA embolization 3/24- Started heparin drip  Consults:  Cardiology, GI, interventional radiology  Procedures:    Significant Diagnostic Tests:  Chest x-ray 3/23-ET tube in stable portion, no acute cardiopulmonary abnormality. CTA 3/23-bilateral PE with RV/with ratio 1.9, emphysema CT head 3/23-chronic microvascular changes.  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 Lower extremity duplex  Micro Data:  Bcx 3/23-negative [name] 3/23-negative  Antimicrobials:    Interim history/subjective:  Remains on the ventilator.  Intermittently on pressors Required fluid bolus for low urine output.  Objective   Blood pressure 120/66, pulse (!) 102, temperature (!) 97 F (36.1 C), resp. rate 16, height 6\' 2"  (1.88 m), weight 80.2 kg, SpO2 100 %. CVP:  [6 mmHg-12 mmHg] 8 mmHg  Vent Mode: PRVC FiO2 (%):  [40 %] 40 % Set Rate:  [15 bmp] 15 bmp Vt Set:  [650 mL] 650 mL PEEP:  [5 cmH20] 5 cmH20 Plateau Pressure:  [15 cmH20-18 cmH20] 18  cmH20   Intake/Output Summary (Last 24 hours) at 03/09/2020 0849 Last data filed at 03/09/2020 0700 Gross per 24 hour  Intake 5830.92 ml  Output 375 ml  Net 5455.92 ml   Filed Weights   03/07/20 0810 03/08/20 0419 03/09/20 0339  Weight: 72.6 kg 73.6 kg 80.2 kg    Examination: Gen:      No acute distress HEENT:  EOMI, sclera anicteric Neck:     No masses; no thyromegaly, ETT Lungs:    Clear to auscultation bilaterally; normal respiratory effort CV:         Regular rate and rhythm; no murmurs Abd:      + bowel sounds; soft, non-tender; no palpable masses, no distension Ext:    No edema; adequate peripheral perfusion Skin:      Warm and dry; no rash Neuro: Sedated  Labs significant for hemoglobin 9.9, WBC 10.6 Chest x-ray 3/25-support apparatus in stable position.  No acute abnormalities.  Resolved Hospital Problem list     Assessment & Plan:  Acute respiratory failure secondary to massive pulmonary embolism with acute cor pulmonale Wean of levo Continue fluid resuscitation, follow CVP Underwent mechanical thrombectomy and IVC filter- the yield on the thrombectomy was minimal as clot was adherent and he likely has subacute clot Not a candidate for surgical thrombectomy or ECMO after discussion with cardiothoracic team. Continue heparin.  Monitor for bleed Discussed in detail with next of kin his niece [name] over telephone and she understands that he remains critically ill with guarded prognosis.   Acute GI bleed, gastric  ulcer Not amenable for endoscopic intervention Underwent GDA embolization.  No evidence of recurrent bleed though hemoglobin is lower today Continue monitoring CBC closely.  If he bleeds then will need surgery for gastrectomy Continue PPI drip.  Cardiac arrest secondary to massive PE Continue normothermia protocol.  Rewarming.  Lower extremity DVT S/p IVC filter.  Can be removed in 3 months if he survives this admission.  Elevated glucose SSI  Best  practice:  Diet: NPO Pain/Anxiety/Delirium protocol (if indicated): Propofol, fentanyl as needed.  RASS goal -1 - VAP protocol (if indicated): Ordered DVT prophylaxis: Holding GI prophylaxis: Pepcid Glucose control: SSI Mobility: Bed Code Status: Full Family Communication: Niece updated daily. Last updated 3/25 Disposition: ICU   Critical care time:    The patient is critically ill with multiple organ system failure and requires high complexity decision making for assessment and support, frequent evaluation and titration of therapies, advanced monitoring, review of radiographic studies and interpretation of complex data.   Critical Care Time devoted to patient care services, exclusive of separately billable procedures, described in this note is 45 minutes.   Marshell Garfinkel MD [facility] Please see [email or web address] for pager details.  03/09/2020, 8:49 AM

## 2020-03-09 NOTE — Progress Notes (Signed)
Progress Note  Patient Name: Kenneth Sullivan Date of Encounter: 03/09/2020  Primary Cardiologist:   No primary care provider on file.   Subjective   Intubated and sedated.  No further GI bleeding.   Inpatient Medications    Scheduled Meds: . chlorhexidine gluconate (MEDLINE KIT)  15 mL Mouth Rinse BID  . Chlorhexidine Gluconate Cloth  6 each Topical Q0600  . insulin aspart  0-9 Units Subcutaneous Q4H  . mouth rinse  15 mL Mouth Rinse 10 times per day  . [START ON 03/10/2020] pantoprazole  40 mg Intravenous Q12H   Continuous Infusions: . sodium chloride 20 mL/hr at 03/09/20 1000  . heparin 1,000 Units/hr (03/09/20 1000)  . lactated ringers 999 mL/hr at 03/09/20 1000  . norepinephrine (LEVOPHED) Adult infusion 3 mcg/min (03/09/20 1008)  . pantoprozole (PROTONIX) infusion 8 mg/hr (03/09/20 1000)  . propofol (DIPRIVAN) infusion 30 mcg/kg/min (03/09/20 1008)   PRN Meds: docusate sodium, fentaNYL (SUBLIMAZE) injection, gelatin adsorbable, midazolam   Vital Signs    Vitals:   03/09/20 0800 03/09/20 0815 03/09/20 0900 03/09/20 1000  BP: 105/84 120/66 (!) 84/74 (!) 150/106  Pulse: 98 (!) 102 96 (!) 120  Resp: 15 16 18  (!) 37  Temp: 99.1 F (37.3 C)  99 F (37.2 C) 99.3 F (37.4 C)  TempSrc:    Bladder  SpO2: 100% 100% 95% 95%  Weight:      Height:        Intake/Output Summary (Last 24 hours) at 03/09/2020 1057 Last data filed at 03/09/2020 1007 Gross per 24 hour  Intake 6374.03 ml  Output 423 ml  Net 5951.03 ml   Filed Weights   03/07/20 0810 03/08/20 0419 03/09/20 0339  Weight: 72.6 kg 73.6 kg 80.2 kg    Telemetry    Sinus tach- Personally Reviewed  ECG    NA - Personally Reviewed  Physical Exam   GEN: Critically ill  Neck: No  JVD Cardiac: RRR, no murmurs, rubs, or gallops.  Respiratory:   Decreased breath sounds GI:   Distended with decreased bowel sounds MS:  Moderate leg edema; No deformity. Neuro:   Intubated and sedated.   Agitated with sedation  wears off    Labs    Chemistry Recent Labs  Lab 03/07/20 0710 03/07/20 1144 03/08/20 0337 03/08/20 0342 03/08/20 1800 03/08/20 1800 03/08/20 2100 03/09/20 0025 03/09/20 0300  NA 138   < > 138   < > 137   < > 135 135 135  K 3.8   < > 3.9   < > 4.3   < > 4.0 4.1 4.0  CL 104   < > 109   < > 108  --  107  --  106  CO2 14*   < > 21*   < > 19*  --  19*  --  20*  GLUCOSE 291*   < > 96   < > 93  --  91  --  95  BUN 7*   < > 7*   < > 6*  --  7*  --  8  CREATININE 1.19   < > 0.77   < > 0.80  --  0.77  --  0.77  CALCIUM 8.0*   < > 7.9*   < > 8.0*  --  8.1*  --  7.9*  PROT 4.4*  --  4.9*  --   --   --   --   --   --   ALBUMIN 1.9*  --  2.0*  --   --   --   --   --   --   AST 60*  --  154*  --   --   --   --   --   --   ALT 29  --  93*  --   --   --   --   --   --   ALKPHOS 55  --  69  --   --   --   --   --   --   BILITOT 0.6  --  0.3  --   --   --   --   --   --   GFRNONAA >60   < > >60   < > >60  --  >60  --  >60  GFRAA >60   < > >60   < > >60  --  >60  --  >60  ANIONGAP 20*   < > 8   < > 10  --  9  --  9   < > = values in this interval not displayed.     Hematology Recent Labs  Lab 03/07/20 2155 03/07/20 2155 03/08/20 0337 03/08/20 0337 03/08/20 0342 03/09/20 0025 03/09/20 0300  WBC 10.8*  --  10.0  --   --   --  10.6*  RBC 3.60*  --  3.63*  --   --   --  3.03*  HGB 11.8*   < > 12.0*   < > 12.2* 11.2* 9.9*  HCT 36.0*   < > 36.1*   < > 36.0* 33.0* 30.5*  MCV 100.0  --  99.4  --   --   --  100.7*  MCH 32.8  --  33.1  --   --   --  32.7  MCHC 32.8  --  33.2  --   --   --  32.5  RDW 15.3  --  15.2  --   --   --  15.7*  PLT 155  --  153  --   --   --  141*   < > = values in this interval not displayed.    Cardiac EnzymesNo results for input(s): TROPONINI in the last 168 hours. No results for input(s): TROPIPOC in the last 168 hours.   BNP Recent Labs  Lab 03/07/20 0710  BNP 74.6     DDimer No results for input(s): DDIMER in the last 168 hours.   Radiology     IR Angiogram Visceral Selective  Result Date: 03/08/2020 INDICATION: 64 year old male with cardiac arrest, massive PE diagnosis, GI hemorrhage with large gastric ulcer, anti-coagulation is contraindicated. He presents for mesenteric angiogram and possible embolization. Same session PE aspiration thrombectomy. EXAM: US GUIDED ACCESS RIGHT COMMON FEMORAL ARTERY MESENTERIC ANGIOGRAM, CELIAC ARTERY, RIGHT GASTRIC ARTERY, GASTRODUODENAL ARTERY COIL EMBOLIZATION OF RIGHT GASTRIC ARTERY AND GDA EXOSEAL DEPLOYED FOR HEMOSTASIS MEDICATIONS: None ANESTHESIA/SEDATION: Moderate (conscious) sedation was employed during this procedure. A total of Versed 0 mg and Fentanyl 100 mcg was administered intravenously. Moderate Sedation Time: 2 hours 24 minutes minutes. The patient's level of consciousness and vital signs were monitored continuously by radiology nursing throughout the procedure under my direct supervision. CONTRAST:  330 cc IV contrast. This total reflects the total amount of contrast for 3 separate cases under 1 anesthesia time. FLUOROSCOPY TIME:  Fluoroscopy Time: 31 minutes 12 seconds (1,144 mGy). This fluoroscopy time reflects 3 separate cases performed. COMPLICATIONS: None  PROCEDURE: Informed consent was obtained from the patient following explanation of the procedure, risks, benefits and alternatives. The patient understands, agrees and consents for the procedure. All questions were addressed. A time out was performed prior to the initiation of the procedure. Maximal barrier sterile technique utilized including caps, mask, sterile gowns, sterile gloves, large sterile drape, hand hygiene, and Betadine prep. Once the IVC filter was placed and mechanical thrombectomy of the pulmonary arteries was performed, we proceeded with mesenteric angiogram and possible embolization. Ultrasound survey of the right inguinal region was performed with images stored and sent to PACs, confirming patency of the vessel. A  micropuncture needle was used access the right common femoral artery under ultrasound. With excellent arterial blood flow returned, and an .018 micro wire was passed through the needle, observed enter the abdominal aorta under fluoroscopy. The needle was removed, and a micropuncture sheath was placed over the wire. The inner dilator and wire were removed, and an 035 Bentson wire was advanced under fluoroscopy into the abdominal aorta. The sheath was removed and a standard 5 [place] vascular sheath was placed. The dilator was removed and the sheath was flushed. A standard C2 Cobra catheter was passed on the Bentson wire used to select the origin of the celiac artery. Celiac artery origin anatomy did not except a good purchase of these Cobra catheter, and thus the Cobra catheter was exchanged for a Mickelson catheter. Mickelson catheter achieved adequate purchase of the celiac artery origin. Angiogram was performed. 150 cm STC catheter was advanced with a 14 fathom wire, used to select the common hepatic artery. Fathom wire was advanced into the right gastric artery with the [place] catheter following easily. Angiogram was performed. Coil embolization of the right gastric artery was performed with 2 by 2 mm coils The catheter was then withdrawn and wire was used to select the gastroduodenal artery. Catheter was advanced into the gastroduodenal artery. Angiogram was performed. Coil embolization was then performed of the GDA with combination of 5 mm coils. Final angiogram performed. We then withdrew all catheters wires and deployed Exoseal for hemostasis. Patient remained hemodynamically stable throughout. No complications were encountered and no significant blood loss from this portion of the examination. IMPRESSION: Status post ultrasound guided access right common femoral artery for mesenteric angiogram and empiric coil embolization of right gastric artery and gastroduodenal artery, as the preferential arterial supply to  the anatomic location of large gastric ulcer. Signed, Dulcy Fanny. Dellia Nims, RPVI [facility] Radiology Electronically Signed   By: Corrie Mckusick D.O.   On: 03/08/2020 09:33   IR Angiogram Pulmonary Bilateral Selective  Result Date: 03/08/2020 INDICATION: 64 year old male with cardiac arrest, massive PE diagnosis, GI hemorrhage with large gastric ulcer, anti-coagulation is contraindicated. He presents for attempt at mechanical/aspiration thrombectomy. We then have plans to proceed with mesenteric angiogram and embolization, and filter placement EXAM: US GUIDED ACCESS OF RIGHT IJ VEIN PULMONARY ANGIOGRAM ASPIRATION/MECHANICAL THROMBECTOMY OF PULMONARY EMBOLUS COMPARISON:  CT 03/07/2020 MEDICATIONS: None. ANESTHESIA/SEDATION: Versed 0 mg IV; Fentanyl 100 mcg IV Moderate Sedation Time:  2 hours 24 minutes The patient was continuously monitored during the procedure by the interventional radiology nurse under my direct supervision. FLUOROSCOPY TIME:  Fluoroscopy Time: 31 minutes 12 seconds (1,144 mGy). This fluoroscopy time reflects 3 separate cases performed at the same event COMPLICATIONS: None TECHNIQUE: Informed written consent was obtained from the patient's family after a thorough discussion of the procedural risks, benefits and alternatives. All questions were  addressed. Maximal Sterile Barrier Technique was utilized including caps, mask, sterile gowns, sterile gloves, sterile drape, hand hygiene and skin antiseptic. A timeout was performed prior to the initiation of the procedure. Operators: Two physicians were present for the complexity of the case. Dr. Arne Cleveland Dr. Corrie Mckusick A micropuncture kit was utilized to access the right common femoral vein under direct, real-time ultrasound guidance after the overlying soft tissues were anesthetized with 1% lidocaine with epinephrine. Ultrasound image documentation was performed. The microwire was passed into  the vein under fluoroscopy. Micro puncture was placed, and then a Bentson wire was passed. Standard 5 French sheath was then placed. The wire would not pass easily centrally. Venogram was performed. This demonstrated significant iliac thrombus into the lower IVC. We then elected to proceed with right internal jugular vein access. A micropuncture kit was utilized to access the right internal jugular vein under direct, real-time ultrasound guidance after the overlying soft tissues were anesthetized with 1% lidocaine with epinephrine. Ultrasound image documentation was performed. A Bentson wire was advanced, through the right atrium into the IVC. Serial dilation was performed. At this point, a retrievable filter was placed from the IJ approach, which is dictated under separate heading. After the IVC filter was placed, Amplatz wire was noted she aided into the IVC. Serial dilation of 18 [place], 20 [place], and 24 [place] was performed at the IJ access site. The proprietary Gore dry seal sheath, 24 French, 33 cm length was placed on the Amplatz wire. Once the sheath was in position, and angled 6 French pigtail catheter was navigated into the pulmonary artery outflow. This was poor formed with a combination of the Bentson wire, back end of the Bentson wire, and the Amplatz wire. Central venous pressure was performed. Once the central venous pressure was performed, the pigtail catheter was removed on a rose in wire with the Rose an wire directed into the lower pulmonary artery branches. The pigtail catheter was removed and then an angled pigtail catheter was used to navigate the wire further distally, for a distal position before placement of the Amplatz wire. Once the catheter was in the distal lower branch, Amplatz wire was placed. Upon initial placement of the catheter set into the lower pole branches, the dry seal sheath had withdrawn from the IJ puncture site which required manual pressure wall we exchanged the catheter  for the dry seal introducer. Dry seal sheath was then replaced after this sequence Jewel exchange, with the wire remaining in the lower pole branches. Once the dry seal sheath was secured with a hemostat and the Amplatz wire was safely within lower pole branches, the 24 [place] INARI device was advanced into the right pulmonary artery branches. The introducer was removed, catheter was advanced to the location of thrombus towards the lower pole branches, and the aspiration syringe was attached. The initial aspiration attempt yielded static flow within the syringe, and we observed a 30 second time frame before withdrawing the syringe. Small yield of thrombus on the initial attempt was confirmed. Three more aspiration attempts were performed at this time with little further yield of thrombus. The Flowtriever2 was then deployed for additional mechanical thrombectomy advantage, with little yield observed. A final position was then achieved into the upper lobe branches, with advancing a penumbra aspiration catheter and coaxial wire into the upper lobe branches. Aspiration was attempted through penumbra catheter with little yield of thrombus. After 4-5 aspiration attempts, we withdrew from this portion of the case. Aspirin catheters and  wires were removed. The IJ dry seal sheath remained until the conclusion of the case after mesenteric angiogram and embolization. This portion of the case is dictated under different heading. After the sheath was removed, stay suture was placed. The right femoral vein sheath was also removed with dry dressing placed. Patient tolerated the procedure well and remained hemodynamically stable throughout. Estimated blood loss: 250 cc FINDINGS: Ultrasound demonstrates patency of right internal jugular vein. Ultrasound at the right inguinal region demonstrates nonocclusive thrombus of the femoral vein. Initial pulmonary angiogram demonstrates ill-defined filling defects within upper lobe and lower  lobe branches on the right. The measured pressure at the beginning of the case is recorded in the nursing notes/flow sheet Small yield of aspirated thrombus with the [facility] flowtriever device. The clot appears to be adherent to the wall indicating some subacute and chronic characteristics Venogram of right lower extremity demonstrates significant iliac thrombus into the lower IVC. IMPRESSION: Status post ultrasound guided access right common femoral vein, and right jugular vein for mechanical and aspiration thrombectomy of pulmonary embolus, with small volume thrombus yield, performed for presentation of massive PE in a patient who is not candidate for anticoagulation. Signed, Dulcy Fanny. Dellia Nims, RPVI [facility] Radiology Electronically Signed   By: Corrie Mckusick D.O.   On: 03/08/2020 16:45   IR Angiogram Selective Each Additional Vessel  Result Date: 03/08/2020 INDICATION: 64 year old male with cardiac arrest, massive PE diagnosis, GI hemorrhage with large gastric ulcer, anti-coagulation is contraindicated. He presents for mesenteric angiogram and possible embolization. Same session PE aspiration thrombectomy. EXAM: US GUIDED ACCESS RIGHT COMMON FEMORAL ARTERY MESENTERIC ANGIOGRAM, CELIAC ARTERY, RIGHT GASTRIC ARTERY, GASTRODUODENAL ARTERY COIL EMBOLIZATION OF RIGHT GASTRIC ARTERY AND GDA EXOSEAL DEPLOYED FOR HEMOSTASIS MEDICATIONS: None ANESTHESIA/SEDATION: Moderate (conscious) sedation was employed during this procedure. A total of Versed 0 mg and Fentanyl 100 mcg was administered intravenously. Moderate Sedation Time: 2 hours 24 minutes minutes. The patient's level of consciousness and vital signs were monitored continuously by radiology nursing throughout the procedure under my direct supervision. CONTRAST:  330 cc IV contrast. This total reflects the total amount of contrast for 3 separate cases under 1 anesthesia time. FLUOROSCOPY TIME:  Fluoroscopy Time:  31 minutes 12 seconds (1,144 mGy). This fluoroscopy time reflects 3 separate cases performed. COMPLICATIONS: None PROCEDURE: Informed consent was obtained from the patient following explanation of the procedure, risks, benefits and alternatives. The patient understands, agrees and consents for the procedure. All questions were addressed. A time out was performed prior to the initiation of the procedure. Maximal barrier sterile technique utilized including caps, mask, sterile gowns, sterile gloves, large sterile drape, hand hygiene, and Betadine prep. Once the IVC filter was placed and mechanical thrombectomy of the pulmonary arteries was performed, we proceeded with mesenteric angiogram and possible embolization. Ultrasound survey of the right inguinal region was performed with images stored and sent to PACs, confirming patency of the vessel. A micropuncture needle was used access the right common femoral artery under ultrasound. With excellent arterial blood flow returned, and an .018 micro wire was passed through the needle, observed enter the abdominal aorta under fluoroscopy. The needle was removed, and a micropuncture sheath was placed over the wire. The inner dilator and wire were removed, and an 035 Bentson wire was advanced under fluoroscopy into the abdominal aorta. The sheath was removed and a standard 5 [place] vascular sheath was placed. The dilator was removed and the sheath was flushed. A standard C2  Cobra catheter was passed on the Bentson wire used to select the origin of the celiac artery. Celiac artery origin anatomy did not except a good purchase of these Cobra catheter, and thus the Cobra catheter was exchanged for a Mickelson catheter. Mickelson catheter achieved adequate purchase of the celiac artery origin. Angiogram was performed. 150 cm STC catheter was advanced with a 14 fathom wire, used to select the common hepatic artery. Fathom wire was advanced into the right gastric artery with the [place]  catheter following easily. Angiogram was performed. Coil embolization of the right gastric artery was performed with 2 by 2 mm coils The catheter was then withdrawn and wire was used to select the gastroduodenal artery. Catheter was advanced into the gastroduodenal artery. Angiogram was performed. Coil embolization was then performed of the GDA with combination of 5 mm coils. Final angiogram performed. We then withdrew all catheters wires and deployed Exoseal for hemostasis. Patient remained hemodynamically stable throughout. No complications were encountered and no significant blood loss from this portion of the examination. IMPRESSION: Status post ultrasound guided access right common femoral artery for mesenteric angiogram and empiric coil embolization of right gastric artery and gastroduodenal artery, as the preferential arterial supply to the anatomic location of large gastric ulcer. Signed, Dulcy Fanny. Dellia Nims, RPVI [facility] Radiology Electronically Signed   By: Corrie Mckusick D.O.   On: 03/08/2020 09:33   IR Angiogram Selective Each Additional Vessel  Result Date: 03/08/2020 INDICATION: 64 year old male with cardiac arrest, massive PE diagnosis, GI hemorrhage with large gastric ulcer, anti-coagulation is contraindicated. He presents for mesenteric angiogram and possible embolization. Same session PE aspiration thrombectomy. EXAM: US GUIDED ACCESS RIGHT COMMON FEMORAL ARTERY MESENTERIC ANGIOGRAM, CELIAC ARTERY, RIGHT GASTRIC ARTERY, GASTRODUODENAL ARTERY COIL EMBOLIZATION OF RIGHT GASTRIC ARTERY AND GDA EXOSEAL DEPLOYED FOR HEMOSTASIS MEDICATIONS: None ANESTHESIA/SEDATION: Moderate (conscious) sedation was employed during this procedure. A total of Versed 0 mg and Fentanyl 100 mcg was administered intravenously. Moderate Sedation Time: 2 hours 24 minutes minutes. The patient's level of consciousness and vital signs were monitored continuously by radiology  nursing throughout the procedure under my direct supervision. CONTRAST:  330 cc IV contrast. This total reflects the total amount of contrast for 3 separate cases under 1 anesthesia time. FLUOROSCOPY TIME:  Fluoroscopy Time: 31 minutes 12 seconds (1,144 mGy). This fluoroscopy time reflects 3 separate cases performed. COMPLICATIONS: None PROCEDURE: Informed consent was obtained from the patient following explanation of the procedure, risks, benefits and alternatives. The patient understands, agrees and consents for the procedure. All questions were addressed. A time out was performed prior to the initiation of the procedure. Maximal barrier sterile technique utilized including caps, mask, sterile gowns, sterile gloves, large sterile drape, hand hygiene, and Betadine prep. Once the IVC filter was placed and mechanical thrombectomy of the pulmonary arteries was performed, we proceeded with mesenteric angiogram and possible embolization. Ultrasound survey of the right inguinal region was performed with images stored and sent to PACs, confirming patency of the vessel. A micropuncture needle was used access the right common femoral artery under ultrasound. With excellent arterial blood flow returned, and an .018 micro wire was passed through the needle, observed enter the abdominal aorta under fluoroscopy. The needle was removed, and a micropuncture sheath was placed over the wire. The inner dilator and wire were removed, and an 035 Bentson wire was advanced under fluoroscopy into the abdominal aorta. The sheath was removed and a standard 5 [place] vascular sheath  was placed. The dilator was removed and the sheath was flushed. A standard C2 Cobra catheter was passed on the Bentson wire used to select the origin of the celiac artery. Celiac artery origin anatomy did not except a good purchase of these Cobra catheter, and thus the Cobra catheter was exchanged for a Mickelson catheter. Mickelson catheter achieved adequate  purchase of the celiac artery origin. Angiogram was performed. 150 cm STC catheter was advanced with a 14 fathom wire, used to select the common hepatic artery. Fathom wire was advanced into the right gastric artery with the [place] catheter following easily. Angiogram was performed. Coil embolization of the right gastric artery was performed with 2 by 2 mm coils The catheter was then withdrawn and wire was used to select the gastroduodenal artery. Catheter was advanced into the gastroduodenal artery. Angiogram was performed. Coil embolization was then performed of the GDA with combination of 5 mm coils. Final angiogram performed. We then withdrew all catheters wires and deployed Exoseal for hemostasis. Patient remained hemodynamically stable throughout. No complications were encountered and no significant blood loss from this portion of the examination. IMPRESSION: Status post ultrasound guided access right common femoral artery for mesenteric angiogram and empiric coil embolization of right gastric artery and gastroduodenal artery, as the preferential arterial supply to the anatomic location of large gastric ulcer. Signed, Dulcy Fanny. Dellia Nims, RPVI [facility] Radiology Electronically Signed   By: Corrie Mckusick D.O.   On: 03/08/2020 09:33   IR Angiogram Selective Each Additional Vessel  Result Date: 03/08/2020 INDICATION: 64 year old male with cardiac arrest, massive PE diagnosis, GI hemorrhage with large gastric ulcer, anti-coagulation is contraindicated. He presents for mesenteric angiogram and possible embolization. Same session PE aspiration thrombectomy. EXAM: US GUIDED ACCESS RIGHT COMMON FEMORAL ARTERY MESENTERIC ANGIOGRAM, CELIAC ARTERY, RIGHT GASTRIC ARTERY, GASTRODUODENAL ARTERY COIL EMBOLIZATION OF RIGHT GASTRIC ARTERY AND GDA EXOSEAL DEPLOYED FOR HEMOSTASIS MEDICATIONS: None ANESTHESIA/SEDATION: Moderate (conscious) sedation was employed during this  procedure. A total of Versed 0 mg and Fentanyl 100 mcg was administered intravenously. Moderate Sedation Time: 2 hours 24 minutes minutes. The patient's level of consciousness and vital signs were monitored continuously by radiology nursing throughout the procedure under my direct supervision. CONTRAST:  330 cc IV contrast. This total reflects the total amount of contrast for 3 separate cases under 1 anesthesia time. FLUOROSCOPY TIME:  Fluoroscopy Time: 31 minutes 12 seconds (1,144 mGy). This fluoroscopy time reflects 3 separate cases performed. COMPLICATIONS: None PROCEDURE: Informed consent was obtained from the patient following explanation of the procedure, risks, benefits and alternatives. The patient understands, agrees and consents for the procedure. All questions were addressed. A time out was performed prior to the initiation of the procedure. Maximal barrier sterile technique utilized including caps, mask, sterile gowns, sterile gloves, large sterile drape, hand hygiene, and Betadine prep. Once the IVC filter was placed and mechanical thrombectomy of the pulmonary arteries was performed, we proceeded with mesenteric angiogram and possible embolization. Ultrasound survey of the right inguinal region was performed with images stored and sent to PACs, confirming patency of the vessel. A micropuncture needle was used access the right common femoral artery under ultrasound. With excellent arterial blood flow returned, and an .018 micro wire was passed through the needle, observed enter the abdominal aorta under fluoroscopy. The needle was removed, and a micropuncture sheath was placed over the wire. The inner dilator and wire were removed, and an 035 Bentson wire was advanced under fluoroscopy into  the abdominal aorta. The sheath was removed and a standard 5 [place] vascular sheath was placed. The dilator was removed and the sheath was flushed. A standard C2 Cobra catheter was passed on the Bentson wire used to  select the origin of the celiac artery. Celiac artery origin anatomy did not except a good purchase of these Cobra catheter, and thus the Cobra catheter was exchanged for a Mickelson catheter. Mickelson catheter achieved adequate purchase of the celiac artery origin. Angiogram was performed. 150 cm STC catheter was advanced with a 14 fathom wire, used to select the common hepatic artery. Fathom wire was advanced into the right gastric artery with the [place] catheter following easily. Angiogram was performed. Coil embolization of the right gastric artery was performed with 2 by 2 mm coils The catheter was then withdrawn and wire was used to select the gastroduodenal artery. Catheter was advanced into the gastroduodenal artery. Angiogram was performed. Coil embolization was then performed of the GDA with combination of 5 mm coils. Final angiogram performed. We then withdrew all catheters wires and deployed Exoseal for hemostasis. Patient remained hemodynamically stable throughout. No complications were encountered and no significant blood loss from this portion of the examination. IMPRESSION: Status post ultrasound guided access right common femoral artery for mesenteric angiogram and empiric coil embolization of right gastric artery and gastroduodenal artery, as the preferential arterial supply to the anatomic location of large gastric ulcer. Signed, Dulcy Fanny. Dellia Nims, RPVI [facility] Radiology Electronically Signed   By: Corrie Mckusick D.O.   On: 03/08/2020 09:33   IR IVC FILTER PLMT / S&I [facility] Keels GUID/MOD SED  Result Date: 03/08/2020 INDICATION: 64 year old male with cardiac arrest, massive PE diagnosis, GI hemorrhage with large gastric ulcer, anti-coagulation is contraindicated. He presents for IVC filter placement, same session PE mechanical/aspiration thrombectomy, and mesenteric angiogram with embolization. EXAM: US GUIDED RIGHT COMMON FEMORAL VEIN ACCESS  VENOGRAM IVC FILTER PLACEMENT MEDICATIONS: None. ANESTHESIA/SEDATION: 0 mg IV Versed; 100 mcg IV Fentanyl Moderate Sedation Time:  0 minutes The patient was continuously monitored during the procedure by the interventional radiology nurse under my direct supervision. In addition the patient was intubated with respiratory teen involved with his care. FLUOROSCOPY TIME:  Fluoroscopy Time: 31 minutes 12 seconds (1,144 mGy). This fluoroscopy time reflects 3 separate cases performed, with separate dictation COMPLICATIONS: None PROCEDURE: The procedure, risks, benefits, and alternatives were explained to the patient. Specific risks discussed include bleeding, infection, contrast reaction, renal failure, IVC filter fracture, migration, iliocaval thrombus (3-4% incidence), need for further procedure, need for further surgery, pulmonary embolism, cardiopulmonary collapse, death. Questions regarding the procedure were encouraged and answered. The patient understands and consents to the procedure. Ultrasound survey was performed with images stored and sent to PACs. The right inguinal region was prepped with chlorhexidine in a sterile fashion, and a sterile drape was applied covering the operative field. A sterile gown and sterile gloves were used for the procedure. Local anesthesia was provided with 1% Lidocaine. We elected to start with access of the right common femoral vein, given our forthcoming attempt at pulmonary embolism thrombectomy with the [facility] device. A micropuncture needle was used access the right common femoral vein under ultrasound. With excellent venous blood flow returned, and an .018 micro wire was passed through the needle. Small incision was made with an 11 blade scalpel. The needle was removed, and a micropuncture sheath was placed over the wire. The inner dilator and wire were removed, and an  035 Bentson wire was advanced under fluoroscopy into the IVC. Standard 5 French sheath was placed. Venogram was  performed. The right jugular region was prepped and draped in the usual sterile fashion. 1% lidocaine was used for local anesthesia. A micropuncture needle was used access the right internal jugular vein under ultrasound. With excellent venous blood flow returned, and an .018 micro wire was passed through the needle. Small incision was made with an 11 blade scalpel. The needle was removed, and a micropuncture sheath was placed over the wire. The inner dilator and wire were removed, and an 035 Bentson wire was advanced under fluoroscopy into the IVC. Serial dilation of the soft tissue tract was performed with an 8 [place] dilator and subsequently a 10 [place] dilator. The delivery sheath for a retrievable Bard Denali filter was passed over the Bentson wire into the IVC. The wire was removed and small contrast was used to confirm IVC location. IVC cavagram performed. Dilator was removed, and the IVC filter was then delivered, positioned below the lowest renal vein. Repeat cavagram performed, and the catheter was removed. After placement of the filter, we proceeded with serial dilation of the IJ access site, for placement of 16F proprietary Gore dry-seal sheath. Patient tolerated the procedure well and remained hemodynamically stable throughout. No complications were encountered and no significant blood loss was encounter. IMPRESSION: Status post ultrasound guided access right common femoral vein with venogram confirming ilio caval thrombus. Status post ultrasound guided access right internal jugular vein with placement of retrievable IVC filter. We then proceeded with pulmonary angiogram and mechanical/aspiration thrombectomy. This will be dictated under a separate dictation Signed, Dulcy Fanny. 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 PLAN: This IVC  filter is potentially retrievable. The patient will be assessed for filter retrieval by Interventional Radiology in approximately 8-12 weeks. Further recommendations regarding filter retrieval, continued surveillance or declaration of device permanence, will be made at that time. Electronically Signed   By: Corrie Mckusick D.O.   On: 03/08/2020 09:27   IR THROMBECT PRIM MECH INIT (INCLU) MOD SED  Result Date: 03/08/2020 INDICATION: 64 year old male with cardiac arrest, massive PE diagnosis, GI hemorrhage with large gastric ulcer, anti-coagulation is contraindicated. He presents for attempt at mechanical/aspiration thrombectomy. We then have plans to proceed with mesenteric angiogram and embolization, and filter placement EXAM: US GUIDED ACCESS OF RIGHT IJ VEIN PULMONARY ANGIOGRAM ASPIRATION/MECHANICAL THROMBECTOMY OF PULMONARY EMBOLUS COMPARISON:  CT 03/07/2020 MEDICATIONS: None. ANESTHESIA/SEDATION: Versed 0 mg IV; Fentanyl 100 mcg IV Moderate Sedation Time:  2 hours 24 minutes The patient was continuously monitored during the procedure by the interventional radiology nurse under my direct supervision. FLUOROSCOPY TIME:  Fluoroscopy Time: 31 minutes 12 seconds (1,144 mGy). This fluoroscopy time reflects 3 separate cases performed at the same event COMPLICATIONS: None TECHNIQUE: Informed written consent was obtained from the patient's family after a thorough discussion of the procedural risks, benefits and alternatives. All questions were addressed. Maximal Sterile Barrier Technique was utilized including caps, mask, sterile gowns, sterile gloves, sterile drape, hand hygiene and skin antiseptic. A timeout was performed prior to the initiation of the procedure. Operators: Two physicians were present for the complexity of the case. Dr. Arne Cleveland Dr. Corrie Mckusick A micropuncture kit was utilized to access the right common femoral vein under direct, real-time ultrasound guidance after the overlying soft tissues  were  anesthetized with 1% lidocaine with epinephrine. Ultrasound image documentation was performed. The microwire was passed into the vein under fluoroscopy. Micro puncture was placed, and then a Bentson wire was passed. Standard 5 French sheath was then placed. The wire would not pass easily centrally. Venogram was performed. This demonstrated significant iliac thrombus into the lower IVC. We then elected to proceed with right internal jugular vein access. A micropuncture kit was utilized to access the right internal jugular vein under direct, real-time ultrasound guidance after the overlying soft tissues were anesthetized with 1% lidocaine with epinephrine. Ultrasound image documentation was performed. A Bentson wire was advanced, through the right atrium into the IVC. Serial dilation was performed. At this point, a retrievable filter was placed from the IJ approach, which is dictated under separate heading. After the IVC filter was placed, Amplatz wire was noted she aided into the IVC. Serial dilation of 18 [place], 20 [place], and 24 [place] was performed at the IJ access site. The proprietary Gore dry seal sheath, 24 French, 33 cm length was placed on the Amplatz wire. Once the sheath was in position, and angled 6 French pigtail catheter was navigated into the pulmonary artery outflow. This was poor formed with a combination of the Bentson wire, back end of the Bentson wire, and the Amplatz wire. Central venous pressure was performed. Once the central venous pressure was performed, the pigtail catheter was removed on a rose in wire with the Rose an wire directed into the lower pulmonary artery branches. The pigtail catheter was removed and then an angled pigtail catheter was used to navigate the wire further distally, for a distal position before placement of the Amplatz wire. Once the catheter was in the distal lower branch, Amplatz wire was placed. Upon initial placement of the catheter set into the lower pole  branches, the dry seal sheath had withdrawn from the IJ puncture site which required manual pressure wall we exchanged the catheter for the dry seal introducer. Dry seal sheath was then replaced after this sequence Jewel exchange, with the wire remaining in the lower pole branches. Once the dry seal sheath was secured with a hemostat and the Amplatz wire was safely within lower pole branches, the 24 [place] INARI device was advanced into the right pulmonary artery branches. The introducer was removed, catheter was advanced to the location of thrombus towards the lower pole branches, and the aspiration syringe was attached. The initial aspiration attempt yielded static flow within the syringe, and we observed a 30 second time frame before withdrawing the syringe. Small yield of thrombus on the initial attempt was confirmed. Three more aspiration attempts were performed at this time with little further yield of thrombus. The Flowtriever2 was then deployed for additional mechanical thrombectomy advantage, with little yield observed. A final position was then achieved into the upper lobe branches, with advancing a penumbra aspiration catheter and coaxial wire into the upper lobe branches. Aspiration was attempted through penumbra catheter with little yield of thrombus. After 4-5 aspiration attempts, we withdrew from this portion of the case. Aspirin catheters and wires were removed. The IJ dry seal sheath remained until the conclusion of the case after mesenteric angiogram and embolization. This portion of the case is dictated under different heading. After the sheath was removed, stay suture was placed. The right femoral vein sheath was also removed with dry dressing placed. Patient tolerated the procedure well and remained hemodynamically stable throughout. Estimated blood loss: 250 cc FINDINGS: Ultrasound demonstrates patency of right internal  jugular vein. Ultrasound at the right inguinal region demonstrates  nonocclusive thrombus of the femoral vein. Initial pulmonary angiogram demonstrates ill-defined filling defects within upper lobe and lower lobe branches on the right. The measured pressure at the beginning of the case is recorded in the nursing notes/flow sheet Small yield of aspirated thrombus with the [facility] flowtriever device. The clot appears to be adherent to the wall indicating some subacute and chronic characteristics Venogram of right lower extremity demonstrates significant iliac thrombus into the lower IVC. IMPRESSION: Status post ultrasound guided access right common femoral vein, and right jugular vein for mechanical and aspiration thrombectomy of pulmonary embolus, with small volume thrombus yield, performed for presentation of massive PE in a patient who is not candidate for anticoagulation. Signed, Dulcy Fanny. Dellia Nims, RPVI V[facility] and Interventional Radiology Specialists [facility] Radiology Electronically Signed   By: Corrie Mckusick D.O.   On: 03/08/2020 16:45   IR US Guide Vasc Access Right  Result Date: 03/08/2020 INDICATION: 64 year old male with cardiac arrest, massive PE diagnosis, GI hemorrhage with large gastric ulcer, anti-coagulation is contraindicated. He presents for attempt at mechanical/aspiration thrombectomy. We then have plans to proceed with mesenteric angiogram and embolization, and filter placement EXAM: US GUIDED ACCESS OF RIGHT IJ VEIN PULMONARY ANGIOGRAM ASPIRATION/MECHANICAL THROMBECTOMY OF PULMONARY EMBOLUS COMPARISON:  CT 03/07/2020 MEDICATIONS: None. ANESTHESIA/SEDATION: Versed 0 mg IV; Fentanyl 100 mcg IV Moderate Sedation Time:  2 hours 24 minutes The patient was continuously monitored during the procedure by the interventional radiology nurse under my direct supervision. FLUOROSCOPY TIME:  Fluoroscopy Time: 31 minutes 12 seconds (1,144 mGy). This fluoroscopy time reflects 3 separate cases performed at the same event COMPLICATIONS: None TECHNIQUE: Informed written  consent was obtained from the patient's family after a thorough discussion of the procedural risks, benefits and alternatives. All questions were addressed. Maximal Sterile Barrier Technique was utilized including caps, mask, sterile gowns, sterile gloves, sterile drape, hand hygiene and skin antiseptic. A timeout was performed prior to the initiation of the procedure. Operators: Two physicians were present for the complexity of the case. Dr. Arne Cleveland Dr. Corrie Mckusick A micropuncture kit was utilized to access the right common femoral vein under direct, real-time ultrasound guidance after the overlying soft tissues were anesthetized with 1% lidocaine with epinephrine. Ultrasound image documentation was performed. The microwire was passed into the vein under fluoroscopy. Micro puncture was placed, and then a Bentson wire was passed. Standard 5 French sheath was then placed. The wire would not pass easily centrally. Venogram was performed. This demonstrated significant iliac thrombus into the lower IVC. We then elected to proceed with right internal jugular vein access. A micropuncture kit was utilized to access the right internal jugular vein under direct, real-time ultrasound guidance after the overlying soft tissues were anesthetized with 1% lidocaine with epinephrine. Ultrasound image documentation was performed. A Bentson wire was advanced, through the right atrium into the IVC. Serial dilation was performed. At this point, a retrievable filter was placed from the IJ approach, which is dictated under separate heading. After the IVC filter was placed, Amplatz wire was noted she aided into the IVC. Serial dilation of 18 [place], 20 [place], and 24 [place] was performed at the IJ access site. The proprietary Gore dry seal sheath, 24 French, 33 cm length was placed on the Amplatz wire. Once the sheath was in position, and angled 6 French pigtail catheter was navigated into the pulmonary artery outflow. This was  poor formed with a combination of the  Bentson wire, back end of the Bentson wire, and the Amplatz wire. Central venous pressure was performed. Once the central venous pressure was performed, the pigtail catheter was removed on a rose in wire with the Rose an wire directed into the lower pulmonary artery branches. The pigtail catheter was removed and then an angled pigtail catheter was used to navigate the wire further distally, for a distal position before placement of the Amplatz wire. Once the catheter was in the distal lower branch, Amplatz wire was placed. Upon initial placement of the catheter set into the lower pole branches, the dry seal sheath had withdrawn from the IJ puncture site which required manual pressure wall we exchanged the catheter for the dry seal introducer. Dry seal sheath was then replaced after this sequence Jewel exchange, with the wire remaining in the lower pole branches. Once the dry seal sheath was secured with a hemostat and the Amplatz wire was safely within lower pole branches, the 24 [place] INARI device was advanced into the right pulmonary artery branches. The introducer was removed, catheter was advanced to the location of thrombus towards the lower pole branches, and the aspiration syringe was attached. The initial aspiration attempt yielded static flow within the syringe, and we observed a 30 second time frame before withdrawing the syringe. Small yield of thrombus on the initial attempt was confirmed. Three more aspiration attempts were performed at this time with little further yield of thrombus. The Flowtriever2 was then deployed for additional mechanical thrombectomy advantage, with little yield observed. A final position was then achieved into the upper lobe branches, with advancing a penumbra aspiration catheter and coaxial wire into the upper lobe branches. Aspiration was attempted through penumbra catheter with little yield of thrombus. After 4-5 aspiration attempts, we  withdrew from this portion of the case. Aspirin catheters and wires were removed. The IJ dry seal sheath remained until the conclusion of the case after mesenteric angiogram and embolization. This portion of the case is dictated under different heading. After the sheath was removed, stay suture was placed. The right femoral vein sheath was also removed with dry dressing placed. Patient tolerated the procedure well and remained hemodynamically stable throughout. Estimated blood loss: 250 cc FINDINGS: Ultrasound demonstrates patency of right internal jugular vein. Ultrasound at the right inguinal region demonstrates nonocclusive thrombus of the femoral vein. Initial pulmonary angiogram demonstrates ill-defined filling defects within upper lobe and lower lobe branches on the right. The measured pressure at the beginning of the case is recorded in the nursing notes/flow sheet Small yield of aspirated thrombus with the [facility] flowtriever device. The clot appears to be adherent to the wall indicating some subacute and chronic characteristics Venogram of right lower extremity demonstrates significant iliac thrombus into the lower IVC. IMPRESSION: Status post ultrasound guided access right common femoral vein, and right jugular vein for mechanical and aspiration thrombectomy of pulmonary embolus, with small volume thrombus yield, performed for presentation of massive PE in a patient who is not candidate for anticoagulation. Signed, Dulcy Fanny. Dellia Nims, RPVI Vascular and Interventional Radiology Specialists [facility] Radiology Electronically Signed   By: Corrie Mckusick D.O.   On: 03/08/2020 16:45   IR US Guide Vasc Access Right  Result Date: 03/08/2020 INDICATION: 64 year old male with cardiac arrest, massive PE diagnosis, GI hemorrhage with large gastric ulcer, anti-coagulation is contraindicated. He presents for mesenteric angiogram and possible embolization. Same session PE aspiration thrombectomy. EXAM: US GUIDED  ACCESS RIGHT COMMON FEMORAL ARTERY MESENTERIC ANGIOGRAM, CELIAC ARTERY,  RIGHT GASTRIC ARTERY, GASTRODUODENAL ARTERY COIL EMBOLIZATION OF RIGHT GASTRIC ARTERY AND GDA EXOSEAL DEPLOYED FOR HEMOSTASIS MEDICATIONS: None ANESTHESIA/SEDATION: Moderate (conscious) sedation was employed during this procedure. A total of Versed 0 mg and Fentanyl 100 mcg was administered intravenously. Moderate Sedation Time: 2 hours 24 minutes minutes. The patient's level of consciousness and vital signs were monitored continuously by radiology nursing throughout the procedure under my direct supervision. CONTRAST:  330 cc IV contrast. This total reflects the total amount of contrast for 3 separate cases under 1 anesthesia time. FLUOROSCOPY TIME:  Fluoroscopy Time: 31 minutes 12 seconds (1,144 mGy). This fluoroscopy time reflects 3 separate cases performed. COMPLICATIONS: None PROCEDURE: Informed consent was obtained from the patient following explanation of the procedure, risks, benefits and alternatives. The patient understands, agrees and consents for the procedure. All questions were addressed. A time out was performed prior to the initiation of the procedure. Maximal barrier sterile technique utilized including caps, mask, sterile gowns, sterile gloves, large sterile drape, hand hygiene, and Betadine prep. Once the IVC filter was placed and mechanical thrombectomy of the pulmonary arteries was performed, we proceeded with mesenteric angiogram and possible embolization. Ultrasound survey of the right inguinal region was performed with images stored and sent to PACs, confirming patency of the vessel. A micropuncture needle was used access the right common femoral artery under ultrasound. With excellent arterial blood flow returned, and an .018 micro wire was passed through the needle, observed enter the abdominal aorta under fluoroscopy. The needle was removed, and a micropuncture sheath was placed over the wire. The inner dilator and  wire were removed, and an 035 Bentson wire was advanced under fluoroscopy into the abdominal aorta. The sheath was removed and a standard 5 [place] vascular sheath was placed. The dilator was removed and the sheath was flushed. A standard C2 Cobra catheter was passed on the Bentson wire used to select the origin of the celiac artery. Celiac artery origin anatomy did not except a good purchase of these Cobra catheter, and thus the Cobra catheter was exchanged for a Mickelson catheter. Mickelson catheter achieved adequate purchase of the celiac artery origin. Angiogram was performed. 150 cm STC catheter was advanced with a 14 fathom wire, used to select the common hepatic artery. Fathom wire was advanced into the right gastric artery with the [place] catheter following easily. Angiogram was performed. Coil embolization of the right gastric artery was performed with 2 by 2 mm coils The catheter was then withdrawn and wire was used to select the gastroduodenal artery. Catheter was advanced into the gastroduodenal artery. Angiogram was performed. Coil embolization was then performed of the GDA with combination of 5 mm coils. Final angiogram performed. We then withdrew all catheters wires and deployed Exoseal for hemostasis. Patient remained hemodynamically stable throughout. No complications were encountered and no significant blood loss from this portion of the examination. IMPRESSION: Status post ultrasound guided access right common femoral artery for mesenteric angiogram and empiric coil embolization of right gastric artery and gastroduodenal artery, as the preferential arterial supply to the anatomic location of large gastric ulcer. Signed, Dulcy Fanny. Dellia Nims, RPVI [facility] Radiology Electronically Signed   By: Corrie Mckusick D.O.   On: 03/08/2020 09:33   IR US Guide Vasc Access Right  Result Date: 03/08/2020 INDICATION: 64 year old male with cardiac arrest, massive  PE diagnosis, GI hemorrhage with large gastric ulcer, anti-coagulation is contraindicated. He presents for IVC filter placement, same session PE mechanical/aspiration thrombectomy,  and mesenteric angiogram with embolization. EXAM: US GUIDED RIGHT COMMON FEMORAL VEIN ACCESS VENOGRAM IVC FILTER PLACEMENT MEDICATIONS: None. ANESTHESIA/SEDATION: 0 mg IV Versed; 100 mcg IV Fentanyl Moderate Sedation Time:  0 minutes The patient was continuously monitored during the procedure by the interventional radiology nurse under my direct supervision. In addition the patient was intubated with respiratory teen involved with his care. FLUOROSCOPY TIME:  Fluoroscopy Time: 31 minutes 12 seconds (1,144 mGy). This fluoroscopy time reflects 3 separate cases performed, with separate dictation COMPLICATIONS: None PROCEDURE: The procedure, risks, benefits, and alternatives were explained to the patient. Specific risks discussed include bleeding, infection, contrast reaction, renal failure, IVC filter fracture, migration, iliocaval thrombus (3-4% incidence), need for further procedure, need for further surgery, pulmonary embolism, cardiopulmonary collapse, death. Questions regarding the procedure were encouraged and answered. The patient understands and consents to the procedure. Ultrasound survey was performed with images stored and sent to PACs. The right inguinal region was prepped with chlorhexidine in a sterile fashion, and a sterile drape was applied covering the operative field. A sterile gown and sterile gloves were used for the procedure. Local anesthesia was provided with 1% Lidocaine. We elected to start with access of the right common femoral vein, given our forthcoming attempt at pulmonary embolism thrombectomy with the [facility] device. A micropuncture needle was used access the right common femoral vein under ultrasound. With excellent venous blood flow returned, and an .018 micro wire was passed through the needle. Small incision  was made with an 11 blade scalpel. The needle was removed, and a micropuncture sheath was placed over the wire. The inner dilator and wire were removed, and an 035 Bentson wire was advanced under fluoroscopy into the IVC. Standard 5 French sheath was placed. Venogram was performed. The right jugular region was prepped and draped in the usual sterile fashion. 1% lidocaine was used for local anesthesia. A micropuncture needle was used access the right internal jugular vein under ultrasound. With excellent venous blood flow returned, and an .018 micro wire was passed through the needle. Small incision was made with an 11 blade scalpel. The needle was removed, and a micropuncture sheath was placed over the wire. The inner dilator and wire were removed, and an 035 Bentson wire was advanced under fluoroscopy into the IVC. Serial dilation of the soft tissue tract was performed with an 8 [place] dilator and subsequently a 10 [place] dilator. The delivery sheath for a retrievable Bard [place]ali filter was passed over the Bentson wire into the IVC. The wire was removed and small contrast was used to confirm IVC location. IVC cavagram performed. Dilator was removed, and the IVC filter was then delivered, positioned below the lowest renal vein. Repeat cavagram performed, and the catheter was removed. After placement of the filter, we proceeded with serial dilation of the IJ access site, for placement of 47F proprietary Gore dry-seal sheath. Patient tolerated the procedure well and remained hemodynamically stable throughout. No complications were encountered and no significant blood loss was encounter. IMPRESSION: Status post ultrasound guided access right common femoral vein with venogram confirming ilio caval thrombus. Status post ultrasound guided access right internal jugular vein with placement of retrievable IVC filter. We then proceeded with pulmonary angiogram and mechanical/aspiration thrombectomy. This will be dictated  under a separate dictation Signed, Dulcy Fanny. Dellia Nims, RPVI [facility] Inter[facility] Radiology Specialists [facility] Radiology FINDINGS: Venogram of the right common femoral access confirms occlusive DVT of the iliac vein extending into the lower IVC. PLAN: This IVC filter  is potentially retrievable. The patient will be assessed for filter retrieval by Interventional Radiology in approximately 8-12 weeks. Further recommendations regarding filter retrieval, continued surveillance or declaration of device permanence, will be made at that time. Electronically Signed   By: Corrie Mckusick D.O.   On: 03/08/2020 09:27   DG Chest Port 1 View  Result Date: 03/09/2020 CLINICAL DATA:  Acute respiratory failure EXAM: PORTABLE CHEST 1 VIEW COMPARISON:  March 08, 2020 FINDINGS: The heart size and mediastinal contours are within normal limits. ETT is 2 cm above the carina. A left-sided PICC is seen with the tip at the superior cavoatrial junction. The lungs are clear. No focal airspace consolidation. No acute osseous abnormality. IMPRESSION: No active disease. Electronically Signed   By: Prudencio Pair M.D.   On: 03/09/2020 05:47   DG Chest Port 1 View  Result Date: 03/08/2020 CLINICAL DATA:  Acute respiratory failure EXAM: PORTABLE CHEST 1 VIEW COMPARISON:  None. FINDINGS: The heart size and mediastinal contours are within normal limits. ETT is 1.7 cm above the carina. A left-sided PICC is seen with the tip at the lower SVC. The lungs are clear. No focal airspace consolidation or pleural effusion. No acute osseous abnormality. Both lungs are clear. The visualized skeletal structures are unremarkable. IMPRESSION: No active disease. Electronically Signed   By: Prudencio Pair M.D.   On: 03/08/2020 06:09   DG Chest Port 1 View  Result Date: 03/07/2020 CLINICAL DATA:  Central line placement EXAM: PORTABLE CHEST 1 VIEW COMPARISON:  03/07/2020 at 0654 hours FINDINGS: Interval placement of a left subclavian approach central  venous catheter with distal tip terminating at the level of the distal SVC. ET tube terminates 2.5 cm superior to the carina. Enteric tube courses below the diaphragm with distal tip and side hole beyond the inferior margin of the film. Normal heart size. No focal airspace consolidation, pleural effusion, or pneumothorax. IMPRESSION: Interval placement of a left subclavian approach central venous catheter with distal tip terminating at the level of the distal SVC. No pneumothorax. Electronically Signed   By: Davina Poke D.O.   On: 03/07/2020 13:19   EEG adult  Result Date: 03/07/2020 Lora Havens, MD     03/07/2020  1:45 PM Patient Name: Razi Hickle MRN: [id number] Epilepsy Attending: Lora Havens Referring Physician/Provider: Dr. Marshell Garfinkel Date: 03/07/2020 Duration: 22.25 minutes Patient history: 64 year old male presented after cardiac arrest.  EEG to evaluate for seizures. Level of alertness: Comatose AEDs during EEG study: Propofol Technical aspects: This EEG study was done with scalp electrodes positioned according to the 10-20 International system of electrode placement. Electrical activity was acquired at a sampling rate of 500Hz  and reviewed with a high frequency filter of 70Hz  and a low frequency filter of 1Hz . EEG data were recorded continuously and digitally stored. Description: EEG showed continuous generalized low amplitude 3-6Hz  theta-delta slowing with overriding 15 to 18 Hz, 2-3 uV beta activity distributed symmetrically and diffusely.  Hyperventilation and photic stimulation were not performed. Abnormality -Continuous slow, generalized -Excessive beta, generalized IMPRESSION: This study is suggestive of severe to profound diffuse encephalopathy, nonspecific etiology but likely secondary to sedation.  No seizures or epileptiform discharges were seen throughout the recording. Priyanka Barbra Sarks   Overnight EEG with video  Result Date: 03/08/2020 Lora Havens, MD      03/08/2020  2:30 PM Patient Name: Zaeden Lastinger MRN: [id number] Epilepsy Attending: Lora Havens Referring Physician/Provider: Dr. Marshell Garfinkel Duration: 03/07/2020 1321 to 03/08/2020 1307  Patient  history: 64 year old male presented after cardiac arrest.  EEG to evaluate for seizures.  Level of alertness: Comatose  AEDs during EEG study: Propofol  Technical aspects: This EEG study was done with scalp electrodes positioned according to the 10-20 International system of electrode placement. Electrical activity was acquired at a sampling rate of 500Hz  and reviewed with a high frequency filter of 70Hz  and a low frequency filter of 1Hz . EEG data were recorded continuously and digitally stored.  Description: EEG initially showed continuous generalized low amplitude 3-6Hz  theta-delta slowing with overriding 15 to 18 Hz, 2-3 uV beta activity distributed symmetrically and diffusely. Gradually eeg showed predominantly 4-8hz  generalized theta-alpha activity.  Hyperventilation and photic stimulation were not performed.  Abnormality -Continuous slow, generalized -Excessive beta, generalized  IMPRESSION: This study is suggestive of moderate to severe diffuse encephalopathy, nonspecific etiology but could be secondary to sedation.  No seizures or epileptiform discharges were seen throughout the recording. EEG appeared to be improving throughout the study. Priyanka O Yadav   IR EMBO ART  VEN HEMORR LYMPH EXTRAV  INC GUIDE ROADMAPPING  Result Date: 03/08/2020 INDICATION: 64 year old male with cardiac arrest, massive PE diagnosis, GI hemorrhage with large gastric ulcer, anti-coagulation is contraindicated. He presents for mesenteric angiogram and possible embolization. Same session PE aspiration thrombectomy. EXAM: US GUIDED ACCESS RIGHT COMMON FEMORAL ARTERY MESENTERIC ANGIOGRAM, CELIAC ARTERY, RIGHT GASTRIC ARTERY, GASTRODUODENAL ARTERY COIL EMBOLIZATION OF RIGHT GASTRIC ARTERY AND GDA EXOSEAL DEPLOYED FOR HEMOSTASIS  MEDICATIONS: None ANESTHESIA/SEDATION: Moderate (conscious) sedation was employed during this procedure. A total of Versed 0 mg and Fentanyl 100 mcg was administered intravenously. Moderate Sedation Time: 2 hours 24 minutes minutes. The patient's level of consciousness and vital signs were monitored continuously by radiology nursing throughout the procedure under my direct supervision. CONTRAST:  330 cc IV contrast. This total reflects the total amount of contrast for 3 separate cases under 1 anesthesia time. FLUOROSCOPY TIME:  Fluoroscopy Time: 31 minutes 12 seconds (1,144 mGy). This fluoroscopy time reflects 3 separate cases performed. COMPLICATIONS: None PROCEDURE: Informed consent was obtained from the patient following explanation of the procedure, risks, benefits and alternatives. The patient understands, agrees and consents for the procedure. All questions were addressed. A time out was performed prior to the initiation of the procedure. Maximal barrier sterile technique utilized including caps, mask, sterile gowns, sterile gloves, large sterile drape, hand hygiene, and Betadine prep. Once the IVC filter was placed and mechanical thrombectomy of the pulmonary arteries was performed, we proceeded with mesenteric angiogram and possible embolization. Ultrasound survey of the right inguinal region was performed with images stored and sent to PACs, confirming patency of the vessel. A micropuncture needle was used access the right common femoral artery under ultrasound. With excellent arterial blood flow returned, and an .018 micro wire was passed through the needle, observed enter the abdominal aorta under fluoroscopy. The needle was removed, and a micropuncture sheath was placed over the wire. The inner dilator and wire were removed, and an 035 Bentson wire was advanced under fluoroscopy into the abdominal aorta. The sheath was removed and a standard 5 [place] vascular sheath was placed. The dilator was removed  and the sheath was flushed. A standard C2 Cobra catheter was passed on the Bentson wire used to select the origin of the celiac artery. Celiac artery origin anatomy did not except a good purchase of these Cobra catheter, and thus the Cobra catheter was exchanged for a Mickelson catheter. Mickelson catheter achieved adequate purchase of the celiac artery  origin. Angiogram was performed. 150 cm STC catheter was advanced with a 14 fathom wire, used to select the common hepatic artery. Fathom wire was advanced into the right gastric artery with the [place] catheter following easily. Angiogram was performed. Coil embolization of the right gastric artery was performed with 2 by 2 mm coils The catheter was then withdrawn and wire was used to select the gastroduodenal artery. Catheter was advanced into the gastroduodenal artery. Angiogram was performed. Coil embolization was then performed of the GDA with combination of 5 mm coils. Final angiogram performed. We then withdrew all catheters wires and deployed Exoseal for hemostasis. Patient remained hemodynamically stable throughout. No complications were encountered and no significant blood loss from this portion of the examination. IMPRESSION: Status post ultrasound guided access right common femoral artery for mesenteric angiogram and empiric coil embolization of right gastric artery and gastroduodenal artery, as the preferential arterial supply to the anatomic location of large gastric ulcer. Signed, Dulcy Fanny. Dellia Nims, RPVI [facility] Interventional Radiology Special[facility] Radiology Electronically Signed   By: Corrie Mckusick D.O.   On: 03/08/2020 09:33   VAS [place] LOWER EXTREMITY VENOUS (DVT)  Result Date: 03/08/2020  Lower Venous DVTStudy Indications: Swelling.  Limitations: Poor ultrasound/tissue interface, bandages, line and arctic sun device. Comparison Study: IR procedure 03-07-20, positive IVC and right iliac vein. Performing Technologist: Baldwin Crown  ARDMS, RVT  Examination Guidelines: A complete evaluation includes B-mode imaging, spectral Doppler, color Doppler, and power Doppler as needed of all accessible portions of each vessel. Bilateral testing is considered an integral part of a complete examination. Limited examinations for reoccurring indications may be performed as noted. The reflux portion of the exam is performed with the patient in reverse Trendelenburg.  +---------+---------------+---------+-----------+----------+--------------+ RIGHT    CompressibilityPhasicitySpontaneityPropertiesThrombus Aging +---------+---------------+---------+-----------+----------+--------------+ CFV      None           No       No                   Acute          +---------+---------------+---------+-----------+----------+--------------+ SFJ      None                                                        +---------+---------------+---------+-----------+----------+--------------+ FV Prox                                               Not visualized +---------+---------------+---------+-----------+----------+--------------+ FV Mid                                                Not visualized +---------+---------------+---------+-----------+----------+--------------+ FV Distal                                             Not visualized +---------+---------------+---------+-----------+----------+--------------+ PFV  Not visualized +---------+---------------+---------+-----------+----------+--------------+ POP      None           No       No                                  +---------+---------------+---------+-----------+----------+--------------+ PTV      None                                                        +---------+---------------+---------+-----------+----------+--------------+ PERO     None                                                         +---------+---------------+---------+-----------+----------+--------------+   +---------+---------------+---------+-----------+----------+--------------+ LEFT     CompressibilityPhasicitySpontaneityPropertiesThrombus Aging +---------+---------------+---------+-----------+----------+--------------+ CFV      None           No       No                   Acute          +---------+---------------+---------+-----------+----------+--------------+ SFJ                                                   Not visualized +---------+---------------+---------+-----------+----------+--------------+ FV Prox                                               Not visualized +---------+---------------+---------+-----------+----------+--------------+ FV Mid                                                Not visualized +---------+---------------+---------+-----------+----------+--------------+ FV Distal                                             Not visualized +---------+---------------+---------+-----------+----------+--------------+ PFV                                                   Not visualized +---------+---------------+---------+-----------+----------+--------------+ POP      None           No       No                                  +---------+---------------+---------+-----------+----------+--------------+ PTV      None                                                        +---------+---------------+---------+-----------+----------+--------------+  PERO     None                                                        +---------+---------------+---------+-----------+----------+--------------+ Gastroc  None                                                        +---------+---------------+---------+-----------+----------+--------------+ CIV                                                   Not visualized  +---------+---------------+---------+-----------+----------+--------------+ EIV                                                   Not visualized +---------+---------------+---------+-----------+----------+--------------+ Unable to visualize bilateral femoral and iliac veins due to arctic sun device. Poorly visualized bilateral calf veins.    Summary: RIGHT: - Findings consistent with acute deep vein thrombosis involving the right common femoral vein, SF junction, right popliteal vein, right posterior tibial veins, and right peroneal veins. However, portions of this examination were limited- see technologist  comments above.  LEFT: - Findings consistent with acute deep vein thrombosis involving the left common femoral vein, left popliteal vein, left posterior tibial veins, and left peroneal veins. However, portions of this examination were limited- see technologist comments above.  *See table(s) above for measurements and observations. Electronically signed by Ruta Hinds MD on 03/08/2020 at 4:02:44 PM.    Final     Cardiac Studies   ECHO  1. Very difficult study with poor windows. The RV appears severely  dilated with severely reduced function. D shaped septum. Overall, concerns  for pulmonary embolism in this patient with cardiac arrest. Clinical  correlation is recommended.  2. Left ventricular ejection fraction, by estimation, is 60 to 65%. The  left ventricle has normal function. Left ventricular endocardial border  not optimally defined to evaluate regional wall motion. Left ventricular  diastolic function could not be  evaluated. There is the interventricular septum is flattened in systole  and diastole, consistent with right ventricular pressure and volume  overload.  3. Right ventricular systolic function is severely reduced. The right  ventricular size is severely enlarged.  4. The mitral valve is grossly normal. No evidence of mitral valve  regurgitation. No evidence of  mitral stenosis.  5. The aortic valve is grossly normal. Aortic valve regurgitation is not  visualized. No aortic stenosis is present.  6. Right atrial size was mildly dilated.   Patient Profile     64 y.o. male with arrest following PE.  No cardiac arrhythmias.  Echo with NL LV function with RV dysfunction/strain.  Now with GI bleed.  Evidence of gastric ulcer and DVT.    Assessment & Plan    Cardiac arrest:  Secondary to PE.  No primary cardiac arrhythmias.   Decreased urine output and labile BPs.  However, BP is elevated with sedation is off.  Argues against continued severe RV dysfunction.  RV could be recovering.  Now with frothy sputum from the ETT although again the urine output is down.   CXR without edema.  He is 8 liters positive and I might ease up fluid resuscitation for now. And support transient sedation induced hypotension with pressors.   The significant unknown currently is is neurologic status post prolonged arrest.    For questions or updates, please contact [place] Please consult [email or web address] for contact info under Cardiology/STEMI.   Signed, Minus Breeding, MD  03/09/2020, 10:57 AM

## 2020-03-09 NOTE — Progress Notes (Signed)
eLink Physician-Brief Progress Note Patient Name: Kenneth Sullivan DOB: 04-01-1956 MRN: [id number]   Date of Service  03/09/2020  HPI/Events of Note  Hypotension responsive to fluid bolus.  eICU Interventions  LR 500 ml iv x 1        Lasasha Brophy U Ivi Griffith 03/09/2020, 2:20 AM

## 2020-03-09 NOTE — Progress Notes (Signed)
Total Parenteral Nutrition Consult  No Known Allergies  Patient Measurements: Height: 6\' 2"  (188 cm) Weight: 176 lb 12.9 oz (80.2 kg) IBW/kg (Calculated) : 82.2  Vital Signs: Temp: 98.2 F (36.8 C) (03/25 1603) Temp Source: Bladder (03/25 1603) BP: 116/65 (03/25 1508) Pulse Rate: 128 (03/25 1508) Intake/Output from previous day: 03/24 0701 - 03/25 0700 In: 5977.9 [I.V.:5477.9; IV Piggyback:500] Out: 405 [Urine:405] Intake/Output from this shift: Total I/O In: 836.4 [I.V.:836.4] Out: 153 [Urine:153]  Labs: Recent Labs    03/07/20 0710 03/07/20 1144 03/07/20 1243 03/07/20 1944 03/07/20 2155 03/08/20 0132 03/08/20 0337 03/08/20 0342 03/08/20 1800 03/08/20 2100 03/09/20 0025 03/09/20 0300 03/09/20 1536  WBC 7.1  --  13.1*   < > 10.8*  --  10.0  --   --   --   --  10.6*  --   HGB 12.0*   < > 13.3   < > 11.8*   < > 12.0*   < >  --   --  11.2* 9.9* 11.9*  HCT 40.9   < > 41.0   < > 36.0*   < > 36.1*   < >  --   --  33.0* 30.5* 35.0*  PLT 100*  --  161   < > 155  --  153  --   --   --   --  141*  --   APTT  --   --  39*  --   --   --   --   --   --   --   --   --   --   CREATININE 1.19  --  0.90   < > 0.79   < > 0.77   < > 0.80 0.77  --  0.77  --   MG  --   --   --   --   --   --  2.1  --   --   --   --  1.8  --   PHOS  --   --   --   --   --   --  3.3  --   --   --   --  3.6  --   ALBUMIN 1.9*  --   --   --   --   --  2.0*  --   --   --   --   --   --   PROT 4.4*  --   --   --   --   --  4.9*  --   --   --   --   --   --   AST 60*  --   --   --   --   --  154*  --   --   --   --   --   --   ALT 29  --   --   --   --   --  93*  --   --   --   --   --   --   ALKPHOS 55  --   --   --   --   --  69  --   --   --   --   --   --   BILITOT 0.6  --   --   --   --   --  0.3  --   --   --   --   --   --    < > =  values in this interval not displayed.   Estimated Creatinine Clearance: 108.6 mL/min (by C-G formula based on SCr of 0.77 mg/dL).  Medical History: Past Medical  History:  Diagnosis Date  . Cardiac arrest ([facility]) 03/07/2020  . Known health problems: none    No recent medical care, has not had a physical in years   Assessment: Patient found to have very large gastic ulcer, s/p gastric bleed post embolization. GI has recommended avoiding enteral feeding for the next 2 weeks. Consult past cutoff for TPN to be started today. Will order labs in am and start nutrition tomorrow.  Erin Hearing PharmD., BCPS Clinical Pharmacist 03/09/2020 4:08 PM

## 2020-03-09 NOTE — Progress Notes (Addendum)
ANTICOAGULATION CONSULT NOTE  Pharmacy Consult:  Heparin Indication: pulmonary embolus  No Known Allergies  Patient Measurements: Height: 6\' 2"  (188 cm) Weight: 176 lb 12.9 oz (80.2 kg) IBW/kg (Calculated) : 82.2 Heparin Dosing Weight: 73 kg  Vital Signs: Temp: 98.6 F (37 C) (03/25 0700) Temp Source: Bladder (03/25 0600) BP: 88/67 (03/25 0700) Pulse Rate: 88 (03/25 0700)  Labs: Recent Labs    03/07/20 0710 03/07/20 1144 03/07/20 1243 03/07/20 1944 03/07/20 2155 03/07/20 2155 03/08/20 0132 03/08/20 0300 03/08/20 0337 03/08/20 0337 03/08/20 0342 03/08/20 0342 03/08/20 1000 03/08/20 1800 03/08/20 2100 03/09/20 0025 03/09/20 0300 03/09/20 0405  HGB 12.0*   < > 13.3   < > 11.8*   < >  --   --  12.0*   < > 12.2*   < >  --   --   --  11.2* 9.9*  --   HCT 40.9   < > 41.0   < > 36.0*   < >  --   --  36.1*   < > 36.0*  --   --   --   --  33.0* 30.5*  --   PLT 100*  --  161   < > 155  --   --   --  153  --   --   --   --   --   --   --  141*  --   APTT  --   --  39*  --   --   --   --   --   --   --   --   --   --   --   --   --   --   --   LABPROT  --   --  16.1*  --   --   --   --   --  14.4  --   --   --   --   --   --   --   --   --   INR  --   --  1.3*  --   --   --   --   --  1.1  --   --   --   --   --   --   --   --   --   HEPARINUNFRC  --   --   --   --   --   --   --  <0.10*  --   --   --   --   --   --  0.43  --   --  0.50  CREATININE 1.19  --  0.90   < > 0.79   < >   < >  --  0.77  --   --   --    < > 0.80 0.77  --  0.77  --   TROPONINIHS 73*  --  8,728*  --   --   --   --   --   --   --   --   --   --   --   --   --   --   --    < > = values in this interval not displayed.    Estimated Creatinine Clearance: 108.6 mL/min (by C-G formula based on SCr of 0.77 mg/dL).   Assessment: 73 YOM presented with asystolic arrest from bilateral PEs.  He also has right iliac and iliocaval  DVTs.  Patient had maroon stools and bloody output from NG upon arrival to ICU.  Upper endoscopy on 03/07/20 showed large cratered oozing gastric ulcer. Went to IR for IVC filter placement and attempted mechanical aspiration of PE. Underwent US CFA access for mesenteric angiogram as well on 03/07/20.  Patient has a high risk for bleeding as well as decompensation and repeat cardiac arrest.  Pharmacy consulted to manage IV heparin.  Maroon stools resolved.  Heparin level is therapeutic and at the high end of goal.    Goal of Therapy: Heparin level 0.3-0.5 units/mL   Plan:  Continue heparin gtt at 1000 units/hr Repeat heparin level  Daily heparin level and CBC  Zaynah Chawla D. Mina Marble, PharmD, BCPS, [place] 03/09/2020, 7:29 AM  ================================  Addendum: Repeat heparin level is therapeutic; no bleeding reported Continue current heparin rate for now  Shaunice Levitan D. Mina Marble, PharmD, BCPS, [place] 03/09/2020, 12:33 PM

## 2020-03-10 ENCOUNTER — Inpatient Hospital Stay (HOSPITAL_COMMUNITY): Payer: Medicaid Other

## 2020-03-10 DIAGNOSIS — K254 Chronic or unspecified gastric ulcer with hemorrhage: Secondary | ICD-10-CM

## 2020-03-10 DIAGNOSIS — G931 Anoxic brain damage, not elsewhere classified: Secondary | ICD-10-CM | POA: Diagnosis not present

## 2020-03-10 DIAGNOSIS — J9600 Acute respiratory failure, unspecified whether with hypoxia or hypercapnia: Secondary | ICD-10-CM

## 2020-03-10 DIAGNOSIS — J9601 Acute respiratory failure with hypoxia: Secondary | ICD-10-CM

## 2020-03-10 DIAGNOSIS — I469 Cardiac arrest, cause unspecified: Secondary | ICD-10-CM | POA: Diagnosis not present

## 2020-03-10 LAB — CBC
HCT: 33.8 % — ABNORMAL LOW (ref 39.0–52.0)
HCT: 33.1 % — ABNORMAL LOW (ref 39.0–52.0)
Hemoglobin: 10.8 g/dL — ABNORMAL LOW (ref 13.0–17.0)
Hemoglobin: 10.7 g/dL — ABNORMAL LOW (ref 13.0–17.0)
MCH: 32 pg (ref 26.0–34.0)
MCH: 32.6 pg (ref 26.0–34.0)
MCHC: 32 g/dL (ref 30.0–36.0)
MCHC: 32.3 g/dL (ref 30.0–36.0)
MCV: 100 fL (ref 80.0–100.0)
MCV: 100.9 fL — ABNORMAL HIGH (ref 80.0–100.0)
Platelets: 163 10*3/uL (ref 150–400)
Platelets: 171 10*3/uL (ref 150–400)
RBC: 3.38 MIL/uL — ABNORMAL LOW (ref 4.22–5.81)
RBC: 3.28 MIL/uL — ABNORMAL LOW (ref 4.22–5.81)
RDW: 15.8 % — ABNORMAL HIGH (ref 11.5–15.5)
RDW: 15.8 % — ABNORMAL HIGH (ref 11.5–15.5)
WBC: 12.1 10*3/uL — ABNORMAL HIGH (ref 4.0–10.5)
WBC: 11.1 10*3/uL — ABNORMAL HIGH (ref 4.0–10.5)
nRBC: 0 % (ref 0.0–0.2)
nRBC: 0 % (ref 0.0–0.2)

## 2020-03-10 LAB — COMPREHENSIVE METABOLIC PANEL
ALT: 52 U/L — ABNORMAL HIGH (ref 0–44)
ALT: 47 U/L — ABNORMAL HIGH (ref 0–44)
AST: 58 U/L — ABNORMAL HIGH (ref 15–41)
AST: 51 U/L — ABNORMAL HIGH (ref 15–41)
Albumin: 2.3 g/dL — ABNORMAL LOW (ref 3.5–5.0)
Albumin: 2.2 g/dL — ABNORMAL LOW (ref 3.5–5.0)
Alkaline Phosphatase: 64 U/L (ref 38–126)
Alkaline Phosphatase: 60 U/L (ref 38–126)
Anion gap: 12 (ref 5–15)
Anion gap: 9 (ref 5–15)
BUN: 11 mg/dL (ref 8–23)
BUN: 10 mg/dL (ref 8–23)
CO2: 19 mmol/L — ABNORMAL LOW (ref 22–32)
CO2: 22 mmol/L (ref 22–32)
Calcium: 8.5 mg/dL — ABNORMAL LOW (ref 8.9–10.3)
Calcium: 8.4 mg/dL — ABNORMAL LOW (ref 8.9–10.3)
Chloride: 104 mmol/L (ref 98–111)
Chloride: 105 mmol/L (ref 98–111)
Creatinine, Ser: 0.98 mg/dL (ref 0.61–1.24)
Creatinine, Ser: 0.9 mg/dL (ref 0.61–1.24)
GFR calc Af Amer: >60 mL/min (ref >60)
GFR calc Af Amer: >60 mL/min (ref >60)
GFR calc non Af Amer: >60 mL/min (ref >60)
GFR calc non Af Amer: >60 mL/min (ref >60)
Glucose, Bld: 62 mg/dL — ABNORMAL LOW (ref 70–99)
Glucose, Bld: 67 mg/dL — ABNORMAL LOW (ref 70–99)
Potassium: 4.2 mmol/L (ref 3.5–5.1)
Potassium: 4.4 mmol/L (ref 3.5–5.1)
Sodium: 135 mmol/L (ref 135–145)
Sodium: 136 mmol/L (ref 135–145)
Total Bilirubin: 0.8 mg/dL (ref 0.3–1.2)
Total Bilirubin: 0.7 mg/dL (ref 0.3–1.2)
Total Protein: 5.6 g/dL — ABNORMAL LOW (ref 6.5–8.1)
Total Protein: 5.4 g/dL — ABNORMAL LOW (ref 6.5–8.1)

## 2020-03-10 LAB — PHOSPHORUS: Phosphorus: 3.9 mg/dL (ref 2.5–4.6)

## 2020-03-10 LAB — GLUCOSE, CAPILLARY
Glucose-Capillary: 109 mg/dL — ABNORMAL HIGH (ref 70–99)
Glucose-Capillary: 109 mg/dL — ABNORMAL HIGH (ref 70–99)
Glucose-Capillary: 56 mg/dL — ABNORMAL LOW (ref 70–99)
Glucose-Capillary: 61 mg/dL — ABNORMAL LOW (ref 70–99)
Glucose-Capillary: 63 mg/dL — ABNORMAL LOW (ref 70–99)
Glucose-Capillary: 65 mg/dL — ABNORMAL LOW (ref 70–99)
Glucose-Capillary: 75 mg/dL (ref 70–99)
Glucose-Capillary: 75 mg/dL (ref 70–99)
Glucose-Capillary: 77 mg/dL (ref 70–99)
Glucose-Capillary: 88 mg/dL (ref 70–99)

## 2020-03-10 LAB — DIFFERENTIAL
Abs Immature Granulocytes: 0.06 10*3/uL (ref 0.00–0.07)
Basophils Absolute: 0 10*3/uL (ref 0.0–0.1)
Basophils Relative: 0 %
Eosinophils Absolute: 0 10*3/uL (ref 0.0–0.5)
Eosinophils Relative: 0 %
Immature Granulocytes: 1 %
Lymphocytes Relative: 9 %
Lymphs Abs: 1 10*3/uL (ref 0.7–4.0)
Monocytes Absolute: 1.1 10*3/uL — ABNORMAL HIGH (ref 0.1–1.0)
Monocytes Relative: 9 %
Neutro Abs: 9.8 10*3/uL — ABNORMAL HIGH (ref 1.7–7.7)
Neutrophils Relative %: 81 %

## 2020-03-10 LAB — TRIGLYCERIDES: Triglycerides: 109 mg/dL (ref <150)

## 2020-03-10 LAB — MAGNESIUM: Magnesium: 1.8 mg/dL (ref 1.7–2.4)

## 2020-03-10 LAB — PREALBUMIN: Prealbumin: 6.7 mg/dL — ABNORMAL LOW (ref 18–38)

## 2020-03-10 LAB — HEPARIN LEVEL (UNFRACTIONATED)
Heparin Unfractionated: 0.56 IU/mL (ref 0.30–0.70)
Heparin Unfractionated: 0.42 IU/mL (ref 0.30–0.70)

## 2020-03-10 MED ORDER — DEXTROSE 50 % IV SOLN
INTRAVENOUS | Status: AC
  Administered 2020-03-10: 25 mL
  Filled 2020-03-10: qty 50

## 2020-03-10 MED ORDER — DEXTROSE 50 % IV SOLN
INTRAVENOUS | Status: AC
  Administered 2020-03-10: 50 mL via INTRAVENOUS
  Filled 2020-03-10: qty 50

## 2020-03-10 MED ORDER — DEXTROSE 50 % IV SOLN
INTRAVENOUS | Status: AC
  Administered 2020-03-10: 50 mL
  Filled 2020-03-10: qty 50

## 2020-03-10 MED ORDER — DEXTROSE 50 % IV SOLN: 50.0000 mL | Freq: Once | INTRAVENOUS | Status: AC

## 2020-03-10 MED ORDER — LACTATED RINGERS IV SOLN: INTRAVENOUS | Status: DC

## 2020-03-10 MED ORDER — DEXTROSE IN LACTATED RINGERS 5 % IV SOLN: INTRAVENOUS | Status: DC

## 2020-03-10 MED ORDER — TRAVASOL 10 % IV SOLN
INTRAVENOUS | Status: AC
  Filled 2020-03-10: qty 571.2

## 2020-03-10 NOTE — Progress Notes (Signed)
Referring Physician(s): Mannam, Praveen  Supervising Physician: Arne Cleveland  Patient Status:  [facility] - In-pt  Chief Complaint: None- intubated with sedation  Subjective:  History of cardiac arrest secondary to: 1- massive PE s/p attempted mechanical and aspiration thrombectomy of PE with small volume thrombus yield along with IVCF placement (for DVT), and 2- GI hemorrhage secondary to large gastric ulcer s/p mesenteric arteriogram with coil embolization of right gastric artery and gastroduodenal artery 03/07/2020 by Dr. Earleen Newport. Patient laying in bed intubated with sedation. He does not respond to voice or tactile stimuli. Right groin and right IJ incisions c/d/i.   Allergies: Patient has no known allergies.  Medications: Prior to Admission medications   Not on File     Vital Signs: BP (!) 162/87   Pulse 96   Temp 99.3 F (37.4 C) (Bladder)   Resp 15   Ht 6' 2"  (1.88 m)   Wt 187 lb 13.3 oz (85.2 kg) Comment: pt with TTM equipment hooked up  SpO2 100%   BMI 24.12 kg/m   Physical Exam Vitals and nursing note reviewed.  Constitutional:      General: He is not in acute distress.    Appearance: He is ill-appearing.     Comments: Intubated with sedation.  Pulmonary:     Effort: Pulmonary effort is normal. No respiratory distress.     Comments: Intubated with sedation. Skin:    General: Skin is warm and dry.     Comments: Right groin incision soft without active bleeding or hematoma. Right IJ incision with suture in place, this was removed and dressing applied to site.  Neurological:     Comments: Intubated with sedation.  Psychiatric:     Comments: Intubated with sedation.     Imaging: CT Head Wo Contrast  Result Date: 03/07/2020 CLINICAL DATA:  64 year old male with history of altered mental status. EXAM: CT HEAD WITHOUT CONTRAST TECHNIQUE: Contiguous axial images were obtained from the base of the skull through the vertex without intravenous contrast.  COMPARISON:  No priors. FINDINGS: Brain: Patchy areas of decreased attenuation are noted throughout the deep and periventricular white matter of the cerebral hemispheres bilaterally, compatible with chronic microvascular ischemic disease. no evidence of acute infarction, hemorrhage, hydrocephalus, extra-axial collection or mass lesion/mass effect. Vascular: No hyperdense vessel or unexpected calcification. Skull: Normal. Negative for fracture or focal lesion. Sinuses/Orbits: No acute finding. Other: Endotracheal and nasogastric tubes are incompletely imaged. IMPRESSION: 1. No acute intracranial abnormalities. 2. Mild chronic microvascular ischemic changes in the cerebral white matter, as above. Electronically Signed   By: Vinnie Langton M.D.   On: 03/07/2020 10:56   CT ANGIO CHEST PE W OR WO CONTRAST  Result Date: 03/07/2020 CLINICAL DATA:  Cardiac arrest, intubated EXAM: CT ANGIOGRAPHY CHEST WITH CONTRAST TECHNIQUE: Multidetector CT imaging of the chest was performed using the standard protocol during bolus administration of intravenous contrast. Multiplanar CT image reconstructions and MIPs were obtained to evaluate the vascular anatomy. CONTRAST:  80 cc OMNIPAQUE IOHEXOL 350 MG/ML SOLN COMPARISON:  03/07/2020 FINDINGS: Cardiovascular: This is a technically adequate evaluation of the pulmonary vasculature. There are bilateral pulmonary emboli right greater than left. There is straightening of the interventricular septum and dilatation of the right ventricle compatible with right heart strain. 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 The thoracic aorta demonstrates normal caliber with no aneurysm or dissection. No pericardial effusion. Mediastinum/Nodes: No enlarged mediastinal, hilar, or axillary lymph nodes. Thyroid gland,  trachea, and esophagus demonstrate no significant findings. Endotracheal tube identified well above carina. Enteric catheter  extends into the gastric lumen. Lungs/Pleura: Scattered upper lobe predominant emphysematous changes are seen. Patchy subpleural consolidation superior segment right lower lobe is nonspecific. No effusion or pneumothorax. Mild lower lobe predominant bronchiectasis. The central airways are patent. Upper Abdomen: Rounded cystic structure left upper quadrant likely partially visualized renal cyst. This measures up to 7.3 cm in diameter. Musculoskeletal: No acute or destructive bony lesions. Reconstructed images demonstrate no additional findings. Review of the MIP images confirms the above findings. IMPRESSION: 1. Bilateral pulmonary emboli. Positive for acute PE with CT evidence of right heart strain (RV/LV Ratio = 1.9) consistent with at least submassive (intermediate risk) PE. The presence of right heart strain has been associated with an increased risk of morbidity and mortality. 2. Emphysema.  Mild bronchiectasis bilaterally. 3. Support devices as above. 4. 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 These results were called by telephone at the time of interpretation on 03/07/2020 at 11:16 a.m. to provider [facility] , who verbally acknowledged these results. Electronically Signed   By: Randa Ngo M.D.   On: 03/07/2020 11:20   IR Angiogram Visceral Selective  Result Date: 03/08/2020 INDICATION: 64 year old male with cardiac arrest, massive PE diagnosis, GI hemorrhage with large gastric ulcer, anti-coagulation is contraindicated. He presents for mesenteric angiogram and possible embolization. Same session PE aspiration thrombectomy. EXAM: US GUIDED ACCESS RIGHT COMMON FEMORAL ARTERY MESENTERIC ANGIOGRAM, CELIAC ARTERY, RIGHT GASTRIC ARTERY, GASTRODUODENAL ARTERY COIL EMBOLIZATION OF RIGHT GASTRIC ARTERY AND GDA EXOSEAL DEPLOYED FOR HEMOSTASIS MEDICATIONS: None ANESTHESIA/SEDATION: Moderate (conscious) sedation was employed during  this procedure. A total of Versed 0 mg and Fentanyl 100 mcg was administered intravenously. Moderate Sedation Time: 2 hours 24 minutes minutes. The patient's level of consciousness and vital signs were monitored continuously by radiology nursing throughout the procedure under my direct supervision. CONTRAST:  330 cc IV contrast. This total reflects the total amount of contrast for 3 separate cases under 1 anesthesia time. FLUOROSCOPY TIME:  Fluoroscopy Time: 31 minutes 12 seconds (1,144 mGy). This fluoroscopy time reflects 3 separate cases performed. COMPLICATIONS: None PROCEDURE: Informed consent was obtained from the patient following explanation of the procedure, risks, benefits and alternatives. The patient understands, agrees and consents for the procedure. All questions were addressed. A time out was performed prior to the initiation of the procedure. Maximal barrier sterile technique utilized including caps, mask, sterile gowns, sterile gloves, large sterile drape, hand hygiene, and Betadine prep. Once the IVC filter was placed and mechanical thrombectomy of the pulmonary arteries was performed, we proceeded with mesenteric angiogram and possible embolization. Ultrasound survey of the right inguinal region was performed with images stored and sent to PACs, confirming patency of the vessel. A micropuncture needle was used access the right common femoral artery under ultrasound. With excellent arterial blood flow returned, and an .018 micro wire was passed through the needle, observed enter the abdominal aorta under fluoroscopy. The needle was removed, and a micropuncture sheath was placed over the wire. The inner dilator and wire were removed, and an 035 Bentson wire was advanced under fluoroscopy into the abdominal aorta. The sheath was removed and a standard 5 [place] vascular sheath was placed. The dilator was removed and the sheath was flushed. A standard C2 Cobra catheter was passed on the Bentson wire  used to select the origin of the celiac artery. Celiac artery origin anatomy  did not except a good purchase of these Cobra catheter, and thus the Cobra catheter was exchanged for a Mickelson catheter. Mickelson catheter achieved adequate purchase of the celiac artery origin. Angiogram was performed. 150 cm STC catheter was advanced with a 14 fathom wire, used to select the common hepatic artery. Fathom wire was advanced into the right gastric artery with the [place] catheter following easily. Angiogram was performed. Coil embolization of the right gastric artery was performed with 2 by 2 mm coils The catheter was then withdrawn and wire was used to select the gastroduodenal artery. Catheter was advanced into the gastroduodenal artery. Angiogram was performed. Coil embolization was then performed of the GDA with combination of 5 mm coils. Final angiogram performed. We then withdrew all catheters wires and deployed Exoseal for hemostasis. Patient remained hemodynamically stable throughout. No complications were encountered and no significant blood loss from this portion of the examination. IMPRESSION: Status post ultrasound guided access right common femoral artery for mesenteric angiogram and empiric coil embolization of right gastric artery and gastroduodenal artery, as the preferential arterial supply to the anatomic location of large gastric ulcer. Signed, Dulcy Fanny. Dellia Nims, RPVI [facility] Radiology Electronically Signed   By: Corrie Mckusick D.O.   On: 03/08/2020 09:33   IR Angiogram Pulmonary Bilateral Selective  Result Date: 03/08/2020 INDICATION: 64 year old male with cardiac arrest, massive PE diagnosis, GI hemorrhage with large gastric ulcer, anti-coagulation is contraindicated. He presents for attempt at mechanical/aspiration thrombectomy. We then have plans to proceed with mesenteric angiogram and embolization, and filter placement EXAM: US GUIDED ACCESS  OF RIGHT IJ VEIN PULMONARY ANGIOGRAM ASPIRATION/MECHANICAL THROMBECTOMY OF PULMONARY EMBOLUS COMPARISON:  CT 03/07/2020 MEDICATIONS: None. ANESTHESIA/SEDATION: Versed 0 mg IV; Fentanyl 100 mcg IV Moderate Sedation Time:  2 hours 24 minutes The patient was continuously monitored during the procedure by the interventional radiology nurse under my direct supervision. FLUOROSCOPY TIME:  Fluoroscopy Time: 31 minutes 12 seconds (1,144 mGy). This fluoroscopy time reflects 3 separate cases performed at the same event COMPLICATIONS: None TECHNIQUE: Informed written consent was obtained from the patient's family after a thorough discussion of the procedural risks, benefits and alternatives. All questions were addressed. Maximal Sterile Barrier Technique was utilized including caps, mask, sterile gowns, sterile gloves, sterile drape, hand hygiene and skin antiseptic. A timeout was performed prior to the initiation of the procedure. Operators: Two physicians were present for the complexity of the case. Dr. Arne Cleveland Dr. Corrie Mckusick A micropuncture kit was utilized to access the right common femoral vein under direct, real-time ultrasound guidance after the overlying soft tissues were anesthetized with 1% lidocaine with epinephrine. Ultrasound image documentation was performed. The microwire was passed into the vein under fluoroscopy. Micro puncture was placed, and then a Bentson wire was passed. Standard 5 French sheath was then placed. The wire would not pass easily centrally. Venogram was performed. This demonstrated significant iliac thrombus into the lower IVC. We then elected to proceed with right internal jugular vein access. A micropuncture kit was utilized to access the right internal jugular vein under direct, real-time ultrasound guidance after the overlying soft tissues were anesthetized with 1% lidocaine with epinephrine. Ultrasound image documentation was performed. A Bentson wire was advanced, through the  right atrium into the IVC. Serial dilation was performed. At this point, a retrievable filter was placed from the IJ approach, which is dictated under separate heading. After the IVC filter was placed, Amplatz wire was noted she aided into  the IVC. Serial dilation of 18 [place], 20 [place], and 24 [place] was performed at the IJ access site. The proprietary Gore dry seal sheath, 24 French, 33 cm length was placed on the Amplatz wire. Once the sheath was in position, and angled 6 French pigtail catheter was navigated into the pulmonary artery outflow. This was poor formed with a combination of the Bentson wire, back end of the Bentson wire, and the Amplatz wire. Central venous pressure was performed. Once the central venous pressure was performed, the pigtail catheter was removed on a rose in wire with the Rose an wire directed into the lower pulmonary artery branches. The pigtail catheter was removed and then an angled pigtail catheter was used to navigate the wire further distally, for a distal position before placement of the Amplatz wire. Once the catheter was in the distal lower branch, Amplatz wire was placed. Upon initial placement of the catheter set into the lower pole branches, the dry seal sheath had withdrawn from the IJ puncture site which required manual pressure wall we exchanged the catheter for the dry seal introducer. Dry seal sheath was then replaced after this sequence Jewel exchange, with the wire remaining in the lower pole branches. Once the dry seal sheath was secured with a hemostat and the Amplatz wire was safely within lower pole branches, the 24 [place] INARI device was advanced into the right pulmonary artery branches. The introducer was removed, catheter was advanced to the location of thrombus towards the lower pole branches, and the aspiration syringe was attached. The initial aspiration attempt yielded static flow within the syringe, and we observed a 30 second time frame before  withdrawing the syringe. Small yield of thrombus on the initial attempt was confirmed. Three more aspiration attempts were performed at this time with little further yield of thrombus. The Flowtriever2 was then deployed for additional mechanical thrombectomy advantage, with little yield observed. A final position was then achieved into the upper lobe branches, with advancing a penumbra aspiration catheter and coaxial wire into the upper lobe branches. Aspiration was attempted through penumbra catheter with little yield of thrombus. After 4-5 aspiration attempts, we withdrew from this portion of the case. Aspirin catheters and wires were removed. The IJ dry seal sheath remained until the conclusion of the case after mesenteric angiogram and embolization. This portion of the case is dictated under different heading. After the sheath was removed, stay suture was placed. The right femoral vein sheath was also removed with dry dressing placed. Patient tolerated the procedure well and remained hemodynamically stable throughout. Estimated blood loss: 250 cc FINDINGS: Ultrasound demonstrates patency of right internal jugular vein. Ultrasound at the right inguinal region demonstrates nonocclusive thrombus of the femoral vein. Initial pulmonary angiogram demonstrates ill-defined filling defects within upper lobe and lower lobe branches on the right. The measured pressure at the beginning of the case is recorded in the nursing notes/flow sheet Small yield of aspirated thrombus with the [facility] flowtriever device. The clot appears to be adherent to the wall indicating some subacute and chronic characteristics Venogram of right lower extremity demonstrates significant iliac thrombus into the lower IVC. IMPRESSION: Status post ultrasound guided access right common femoral vein, and right jugular vein for mechanical and aspiration thrombectomy of pulmonary embolus, with small volume thrombus yield, performed for presentation of  massive PE in a patient who is not candidate for anticoagulation. Signed, Dulcy Fanny. Dellia Nims, [place] [facility] Radiology Electronically Signed   By: York Cerise  Earleen Newport D.O.   On: 03/08/2020 16:45   IR Angiogram Selective Each Additional Vessel  Result Date: 03/08/2020 INDICATION: 64 year old male with cardiac arrest, massive PE diagnosis, GI hemorrhage with large gastric ulcer, anti-coagulation is contraindicated. He presents for mesenteric angiogram and possible embolization. Same session PE aspiration thrombectomy. EXAM: US GUIDED ACCESS RIGHT COMMON FEMORAL ARTERY MESENTERIC ANGIOGRAM, CELIAC ARTERY, RIGHT GASTRIC ARTERY, GASTRODUODENAL ARTERY COIL EMBOLIZATION OF RIGHT GASTRIC ARTERY AND GDA EXOSEAL DEPLOYED FOR HEMOSTASIS MEDICATIONS: None ANESTHESIA/SEDATION: Moderate (conscious) sedation was employed during this procedure. A total of Versed 0 mg and Fentanyl 100 mcg was administered intravenously. Moderate Sedation Time: 2 hours 24 minutes minutes. The patient's level of consciousness and vital signs were monitored continuously by radiology nursing throughout the procedure under my direct supervision. CONTRAST:  330 cc IV contrast. This total reflects the total amount of contrast for 3 separate cases under 1 anesthesia time. FLUOROSCOPY TIME:  Fluoroscopy Time: 31 minutes 12 seconds (1,144 mGy). This fluoroscopy time reflects 3 separate cases performed. COMPLICATIONS: None PROCEDURE: Informed consent was obtained from the patient following explanation of the procedure, risks, benefits and alternatives. The patient understands, agrees and consents for the procedure. All questions were addressed. A time out was performed prior to the initiation of the procedure. Maximal barrier sterile technique utilized including caps, mask, sterile gowns, sterile gloves, large sterile drape, hand hygiene, and Betadine prep. Once the IVC filter was placed and mechanical  thrombectomy of the pulmonary arteries was performed, we proceeded with mesenteric angiogram and possible embolization. Ultrasound survey of the right inguinal region was performed with images stored and sent to PACs, confirming patency of the vessel. A micropuncture needle was used access the right common femoral artery under ultrasound. With excellent arterial blood flow returned, and an .018 micro wire was passed through the needle, observed enter the abdominal aorta under fluoroscopy. The needle was removed, and a micropuncture sheath was placed over the wire. The inner dilator and wire were removed, and an 035 Bentson wire was advanced under fluoroscopy into the abdominal aorta. The sheath was removed and a standard 5 [place] vascular sheath was placed. The dilator was removed and the sheath was flushed. A standard C2 Cobra catheter was passed on the Bentson wire used to select the origin of the celiac artery. Celiac artery origin anatomy did not except a good purchase of these Cobra catheter, and thus the Cobra catheter was exchanged for a Mickelson catheter. Mickelson catheter achieved adequate purchase of the celiac artery origin. Angiogram was performed. 150 cm STC catheter was advanced with a 14 fathom wire, used to select the common hepatic artery. Fathom wire was advanced into the right gastric artery with the [place] catheter following easily. Angiogram was performed. Coil embolization of the right gastric artery was performed with 2 by 2 mm coils The catheter was then withdrawn and wire was used to select the gastroduodenal artery. Catheter was advanced into the gastroduodenal artery. Angiogram was performed. Coil embolization was then performed of the GDA with combination of 5 mm coils. Final angiogram performed. We then withdrew all catheters wires and deployed Exoseal for hemostasis. Patient remained hemodynamically stable throughout. No complications were encountered and no significant blood loss from  this portion of the examination. IMPRESSION: Status post ultrasound guided access right common femoral artery for mesenteric angiogram and empiric coil embolization of right gastric artery and gastroduodenal artery, as the preferential arterial supply to the anatomic location of large gastric ulcer. Signed, Dulcy Fanny. Earleen Newport, DO, RPVI  [facility] Inter[facility] Radiology Special[facility] Radiology Electronically Signed   By: Corrie Mckusick D.O.   On: 03/08/2020 09:33   IR Angiogram Selective Each Additional Vessel  Result Date: 03/08/2020 INDICATION: 64 year old male with cardiac arrest, massive PE diagnosis, GI hemorrhage with large gastric ulcer, anti-coagulation is contraindicated. He presents for mesenteric angiogram and possible embolization. Same session PE aspiration thrombectomy. EXAM: US GUIDED ACCESS RIGHT COMMON FEMORAL ARTERY MESENTERIC ANGIOGRAM, CELIAC ARTERY, RIGHT GASTRIC ARTERY, GASTRODUODENAL ARTERY COIL EMBOLIZATION OF RIGHT GASTRIC ARTERY AND GDA EXOSEAL DEPLOYED FOR HEMOSTASIS MEDICATIONS: None ANESTHESIA/SEDATION: Moderate (conscious) sedation was employed during this procedure. A total of Versed 0 mg and Fentanyl 100 mcg was administered intravenously. Moderate Sedation Time: 2 hours 24 minutes minutes. The patient's level of consciousness and vital signs were monitored continuously by radiology nursing throughout the procedure under my direct supervision. CONTRAST:  330 cc IV contrast. This total reflects the total amount of contrast for 3 separate cases under 1 anesthesia time. FLUOROSCOPY TIME:  Fluoroscopy Time: 31 minutes 12 seconds (1,144 mGy). This fluoroscopy time reflects 3 separate cases performed. COMPLICATIONS: None PROCEDURE: Informed consent was obtained from the patient following explanation of the procedure, risks, benefits and alternatives. The patient understands, agrees and consents for the procedure. All questions were addressed. A time out was performed prior to  the initiation of the procedure. Maximal barrier sterile technique utilized including caps, mask, sterile gowns, sterile gloves, large sterile drape, hand hygiene, and Betadine prep. Once the IVC filter was placed and mechanical thrombectomy of the pulmonary arteries was performed, we proceeded with mesenteric angiogram and possible embolization. Ultrasound survey of the right inguinal region was performed with images stored and sent to PACs, confirming patency of the vessel. A micropuncture needle was used access the right common femoral artery under ultrasound. With excellent arterial blood flow returned, and an .018 micro wire was passed through the needle, observed enter the abdominal aorta under fluoroscopy. The needle was removed, and a micropuncture sheath was placed over the wire. The inner dilator and wire were removed, and an 035 Bentson wire was advanced under fluoroscopy into the abdominal aorta. The sheath was removed and a standard 5 [place] vascular sheath was placed. The dilator was removed and the sheath was flushed. A standard C2 Cobra catheter was passed on the Bentson wire used to select the origin of the celiac artery. Celiac artery origin anatomy did not except a good purchase of these Cobra catheter, and thus the Cobra catheter was exchanged for a Mickelson catheter. Mickelson catheter achieved adequate purchase of the celiac artery origin. Angiogram was performed. 150 cm STC catheter was advanced with a 14 fathom wire, used to select the common hepatic artery. Fathom wire was advanced into the right gastric artery with the [place] catheter following easily. Angiogram was performed. Coil embolization of the right gastric artery was performed with 2 by 2 mm coils The catheter was then withdrawn and wire was used to select the gastroduodenal artery. Catheter was advanced into the gastroduodenal artery. Angiogram was performed. Coil embolization was then performed of the GDA with combination of 5 mm  coils. Final angiogram performed. We then withdrew all catheters wires and deployed Exoseal for hemostasis. Patient remained hemodynamically stable throughout. No complications were encountered and no significant blood loss from this portion of the examination. IMPRESSION: Status post ultrasound guided access right common femoral artery for mesenteric angiogram and empiric coil embolization of right gastric artery and gastroduodenal artery, as the preferential arterial supply  to the anatomic location of large gastric ulcer. Signed, Dulcy Fanny. Dellia Nims, RPVI [facility] Radiology Electronically Signed   By: Corrie Mckusick D.O.   On: 03/08/2020 09:33   IR Angiogram Selective Each Additional Vessel  Result Date: 03/08/2020 INDICATION: 64 year old male with cardiac arrest, massive PE diagnosis, GI hemorrhage with large gastric ulcer, anti-coagulation is contraindicated. He presents for mesenteric angiogram and possible embolization. Same session PE aspiration thrombectomy. EXAM: US GUIDED ACCESS RIGHT COMMON FEMORAL ARTERY MESENTERIC ANGIOGRAM, CELIAC ARTERY, RIGHT GASTRIC ARTERY, GASTRODUODENAL ARTERY COIL EMBOLIZATION OF RIGHT GASTRIC ARTERY AND GDA EXOSEAL DEPLOYED FOR HEMOSTASIS MEDICATIONS: None ANESTHESIA/SEDATION: Moderate (conscious) sedation was employed during this procedure. A total of Versed 0 mg and Fentanyl 100 mcg was administered intravenously. Moderate Sedation Time: 2 hours 24 minutes minutes. The patient's level of consciousness and vital signs were monitored continuously by radiology nursing throughout the procedure under my direct supervision. CONTRAST:  330 cc IV contrast. This total reflects the total amount of contrast for 3 separate cases under 1 anesthesia time. FLUOROSCOPY TIME:  Fluoroscopy Time: 31 minutes 12 seconds (1,144 mGy). This fluoroscopy time reflects 3 separate cases performed. COMPLICATIONS: None PROCEDURE: Informed consent was  obtained from the patient following explanation of the procedure, risks, benefits and alternatives. The patient understands, agrees and consents for the procedure. All questions were addressed. A time out was performed prior to the initiation of the procedure. Maximal barrier sterile technique utilized including caps, mask, sterile gowns, sterile gloves, large sterile drape, hand hygiene, and Betadine prep. Once the IVC filter was placed and mechanical thrombectomy of the pulmonary arteries was performed, we proceeded with mesenteric angiogram and possible embolization. Ultrasound survey of the right inguinal region was performed with images stored and sent to PACs, confirming patency of the vessel. A micropuncture needle was used access the right common femoral artery under ultrasound. With excellent arterial blood flow returned, and an .018 micro wire was passed through the needle, observed enter the abdominal aorta under fluoroscopy. The needle was removed, and a micropuncture sheath was placed over the wire. The inner dilator and wire were removed, and an 035 Bentson wire was advanced under fluoroscopy into the abdominal aorta. The sheath was removed and a standard 5 [place] vascular sheath was placed. The dilator was removed and the sheath was flushed. A standard C2 Cobra catheter was passed on the Bentson wire used to select the origin of the celiac artery. Celiac artery origin anatomy did not except a good purchase of these Cobra catheter, and thus the Cobra catheter was exchanged for a Mickelson catheter. Mickelson catheter achieved adequate purchase of the celiac artery origin. Angiogram was performed. 150 cm STC catheter was advanced with a 14 fathom wire, used to select the common hepatic artery. Fathom wire was advanced into the right gastric artery with the Blackduck catheter following easily. Angiogram was performed. Coil embolization of the right gastric artery was performed with 2 by 2 mm coils The catheter  was then withdrawn and wire was used to select the gastroduodenal artery. Catheter was advanced into the gastroduodenal artery. Angiogram was performed. Coil embolization was then performed of the GDA with combination of 5 mm coils. Final angiogram performed. We then withdrew all catheters wires and deployed Exoseal for hemostasis. Patient remained hemodynamically stable throughout. No complications were encountered and no significant blood loss from this portion of the examination. IMPRESSION: Status post ultrasound guided access right common femoral artery for mesenteric angiogram and empiric  coil embolization of right gastric artery and gastroduodenal artery, as the preferential arterial supply to the anatomic location of large gastric ulcer. Signed, Dulcy Fanny. Dellia Nims, RPVI Vascular and Interventional Radiology Specialists [facility] Radiology Electronically Signed   By: Corrie Mckusick D.O.   On: 03/08/2020 09:33   IR IVC FILTER PLMT / S&I Burke Keels GUID/MOD SED  Result Date: 03/08/2020 INDICATION: 65 year old male with cardiac arrest, massive PE diagnosis, GI hemorrhage with large gastric ulcer, anti-coagulation is contraindicated. He presents for IVC filter placement, same session PE mechanical/aspiration thrombectomy, and mesenteric angiogram with embolization. EXAM: US GUIDED RIGHT COMMON FEMORAL VEIN ACCESS VENOGRAM IVC FILTER PLACEMENT MEDICATIONS: None. ANESTHESIA/SEDATION: 0 mg IV Versed; 100 mcg IV Fentanyl Moderate Sedation Time:  0 minutes The patient was continuously monitored during the procedure by the interventional radiology nurse under my direct supervision. In addition the patient was intubated with respiratory teen involved with his care. FLUOROSCOPY TIME:  Fluoroscopy Time: 31 minutes 12 seconds (1,144 mGy). This fluoroscopy time reflects 3 separate cases performed, with separate dictation COMPLICATIONS: None PROCEDURE: The procedure, risks, benefits, and alternatives were explained to the  patient. Specific risks discussed include bleeding, infection, contrast reaction, renal failure, IVC filter fracture, migration, iliocaval thrombus (3-4% incidence), need for further procedure, need for further surgery, pulmonary embolism, cardiopulmonary collapse, death. Questions regarding the procedure were encouraged and answered. The patient understands and consents to the procedure. Ultrasound survey was performed with images stored and sent to PACs. The right inguinal region was prepped with chlorhexidine in a sterile fashion, and a sterile drape was applied covering the operative field. A sterile gown and sterile gloves were used for the procedure. Local anesthesia was provided with 1% Lidocaine. We elected to start with access of the right common femoral vein, given our forthcoming attempt at pulmonary embolism thrombectomy with the [facility] device. A micropuncture needle was used access the right common femoral vein under ultrasound. With excellent venous blood flow returned, and an .018 micro wire was passed through the needle. Small incision was made with an 11 blade scalpel. The needle was removed, and a micropuncture sheath was placed over the wire. The inner dilator and wire were removed, and an 035 Bentson wire was advanced under fluoroscopy into the IVC. Standard 5 French sheath was placed. Venogram was performed. The right jugular region was prepped and draped in the usual sterile fashion. 1% lidocaine was used for local anesthesia. A micropuncture needle was used access the right internal jugular vein under ultrasound. With excellent venous blood flow returned, and an .018 micro wire was passed through the needle. Small incision was made with an 11 blade scalpel. The needle was removed, and a micropuncture sheath was placed over the wire. The inner dilator and wire were removed, and an 035 Bentson wire was advanced under fluoroscopy into the IVC. Serial dilation of the soft tissue tract was performed  with an 8 [place] dilator and subsequently a 10 [place] dilator. The delivery sheath for a retrievable Bard Denali filter was passed over the Bentson wire into the IVC. The wire was removed and small contrast was used to confirm IVC location. IVC cavagram performed. Dilator was removed, and the IVC filter was then delivered, positioned below the lowest renal vein. Repeat cavagram performed, and the catheter was removed. After placement of the filter, we proceeded with serial dilation of the IJ access site, for placement of 44F proprietary Gore dry-seal sheath. Patient tolerated the procedure well and remained hemodynamically stable throughout. No  complications were encountered and no significant blood loss was encounter. IMPRESSION: Status post ultrasound guided access right common femoral vein with venogram confirming ilio caval thrombus. Status post ultrasound guided access right internal jugular vein with placement of retrievable IVC filter. We then proceeded with pulmonary angiogram and mechanical/aspiration thrombectomy. This will be dictated under a separate dictation Signed, Dulcy Fanny. 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 PLAN: This IVC filter is potentially retrievable. The patient will be assessed for filter retrieval by Interventional Radiology in approximately 8-12 weeks. Further recommendations regarding filter retrieval, continued surveillance or declaration of device permanence, will be made at that time. Electronically Signed   By: Corrie Mckusick D.O.   On: 03/08/2020 09:27   IR THROMBECT PRIM MECH INIT (INCLU) MOD SED  Result Date: 03/08/2020 INDICATION: 64 year old male with cardiac arrest, massive PE diagnosis, GI hemorrhage with large gastric ulcer, anti-coagulation is contraindicated. He presents for attempt at  mechanical/aspiration thrombectomy. We then have plans to proceed with mesenteric angiogram and embolization, and filter placement EXAM: US GUIDED ACCESS OF RIGHT IJ VEIN PULMONARY ANGIOGRAM ASPIRATION/MECHANICAL THROMBECTOMY OF PULMONARY EMBOLUS COMPARISON:  CT 03/07/2020 MEDICATIONS: None. ANESTHESIA/SEDATION: Versed 0 mg IV; Fentanyl 100 mcg IV Moderate Sedation Time:  2 hours 24 minutes The patient was continuously monitored during the procedure by the interventional radiology nurse under my direct supervision. FLUOROSCOPY TIME:  Fluoroscopy Time: 31 minutes 12 seconds (1,144 mGy). This fluoroscopy time reflects 3 separate cases performed at the same event COMPLICATIONS: None TECHNIQUE: Informed written consent was obtained from the patient's family after a thorough discussion of the procedural risks, benefits and alternatives. All questions were addressed. Maximal Sterile Barrier Technique was utilized including caps, mask, sterile gowns, sterile gloves, sterile drape, hand hygiene and skin antiseptic. A timeout was performed prior to the initiation of the procedure. Operators: Two physicians were present for the complexity of the case. Dr. Arne Cleveland Dr. Corrie Mckusick A micropuncture kit was utilized to access the right common femoral vein under direct, real-time ultrasound guidance after the overlying soft tissues were anesthetized with 1% lidocaine with epinephrine. Ultrasound image documentation was performed. The microwire was passed into the vein under fluoroscopy. Micro puncture was placed, and then a Bentson wire was passed. Standard 5 French sheath was then placed. The wire would not pass easily centrally. Venogram was performed. This demonstrated significant iliac thrombus into the lower IVC. We then elected to proceed with right internal jugular vein access. A micropuncture kit was utilized to access the right internal jugular vein under direct, real-time ultrasound guidance after the overlying  soft tissues were anesthetized with 1% lidocaine with epinephrine. Ultrasound image documentation was performed. A Bentson wire was advanced, through the right atrium into the IVC. Serial dilation was performed. At this point, a retrievable filter was placed from the IJ approach, which is dictated under separate heading. After the IVC filter was placed, Amplatz wire was noted she aided into the IVC. Serial dilation of 18 [place], 20 [place], and 24 [place] was performed at the IJ access site. The proprietary Gore dry seal sheath, 24 French, 33 cm length was placed on the Amplatz wire. Once the sheath was in position, and angled 6 French pigtail catheter was navigated into the pulmonary artery outflow. This was poor formed with a combination of the Bentson wire, back end of the Bentson wire, and  the Amplatz wire. Central venous pressure was performed. Once the central venous pressure was performed, the pigtail catheter was removed on a rose in wire with the Rose an wire directed into the lower pulmonary artery branches. The pigtail catheter was removed and then an angled pigtail catheter was used to navigate the wire further distally, for a distal position before placement of the Amplatz wire. Once the catheter was in the distal lower branch, Amplatz wire was placed. Upon initial placement of the catheter set into the lower pole branches, the dry seal sheath had withdrawn from the IJ puncture site which required manual pressure wall we exchanged the catheter for the dry seal introducer. Dry seal sheath was then replaced after this sequence Jewel exchange, with the wire remaining in the lower pole branches. Once the dry seal sheath was secured with a hemostat and the Amplatz wire was safely within lower pole branches, the 24 [place] INARI device was advanced into the right pulmonary artery branches. The introducer was removed, catheter was advanced to the location of thrombus towards the lower pole branches, and the  aspiration syringe was attached. The initial aspiration attempt yielded static flow within the syringe, and we observed a 30 second time frame before withdrawing the syringe. Small yield of thrombus on the initial attempt was confirmed. Three more aspiration attempts were performed at this time with little further yield of thrombus. The Flowtriever2 was then deployed for additional mechanical thrombectomy advantage, with little yield observed. A final position was then achieved into the upper lobe branches, with advancing a penumbra aspiration catheter and coaxial wire into the upper lobe branches. Aspiration was attempted through penumbra catheter with little yield of thrombus. After 4-5 aspiration attempts, we withdrew from this portion of the case. Aspirin catheters and wires were removed. The IJ dry seal sheath remained until the conclusion of the case after mesenteric angiogram and embolization. This portion of the case is dictated under different heading. After the sheath was removed, stay suture was placed. The right femoral vein sheath was also removed with dry dressing placed. Patient tolerated the procedure well and remained hemodynamically stable throughout. Estimated blood loss: 250 cc FINDINGS: Ultrasound demonstrates patency of right internal jugular vein. Ultrasound at the right inguinal region demonstrates nonocclusive thrombus of the femoral vein. Initial pulmonary angiogram demonstrates ill-defined filling defects within upper lobe and lower lobe branches on the right. The measured pressure at the beginning of the case is recorded in the nursing notes/flow sheet Small yield of aspirated thrombus with the [facility] flowtriever device. The clot appears to be adherent to the wall indicating some subacute and chronic characteristics Venogram of right lower extremity demonstrates significant iliac thrombus into the lower IVC. IMPRESSION: Status post ultrasound guided access right common femoral vein, and  right jugular vein for mechanical and aspiration thrombectomy of pulmonary embolus, with small volume thrombus yield, performed for presentation of massive PE in a patient who is not candidate for anticoagulation. Signed, Dulcy Fanny. Dellia Nims, RPVI Vascular and Interventional Radiology Specialists [facility] Radiology Electronically Signed   By: Corrie Mckusick D.O.   On: 03/08/2020 16:45   IR US Guide Vasc Access Right  Result Date: 03/08/2020 INDICATION: 64 year old male with cardiac arrest, massive PE diagnosis, GI hemorrhage with large gastric ulcer, anti-coagulation is contraindicated. He presents for attempt at mechanical/aspiration thrombectomy. We then have plans to proceed with mesenteric angiogram and embolization, and filter placement EXAM: US GUIDED ACCESS OF RIGHT IJ VEIN PULMONARY ANGIOGRAM ASPIRATION/MECHANICAL THROMBECTOMY OF  PULMONARY EMBOLUS COMPARISON:  CT 03/07/2020 MEDICATIONS: None. ANESTHESIA/SEDATION: Versed 0 mg IV; Fentanyl 100 mcg IV Moderate Sedation Time:  2 hours 24 minutes The patient was continuously monitored during the procedure by the interventional radiology nurse under my direct supervision. FLUOROSCOPY TIME:  Fluoroscopy Time: 31 minutes 12 seconds (1,144 mGy). This fluoroscopy time reflects 3 separate cases performed at the same event COMPLICATIONS: None TECHNIQUE: Informed written consent was obtained from the patient's family after a thorough discussion of the procedural risks, benefits and alternatives. All questions were addressed. Maximal Sterile Barrier Technique was utilized including caps, mask, sterile gowns, sterile gloves, sterile drape, hand hygiene and skin antiseptic. A timeout was performed prior to the initiation of the procedure. Operators: Two physicians were present for the complexity of the case. Dr. Arne Cleveland Dr. Corrie Mckusick A micropuncture kit was utilized to access the right common femoral vein under direct, real-time ultrasound guidance after the  overlying soft tissues were anesthetized with 1% lidocaine with epinephrine. Ultrasound image documentation was performed. The microwire was passed into the vein under fluoroscopy. Micro puncture was placed, and then a Bentson wire was passed. Standard 5 French sheath was then placed. The wire would not pass easily centrally. Venogram was performed. This demonstrated significant iliac thrombus into the lower IVC. We then elected to proceed with right internal jugular vein access. A micropuncture kit was utilized to access the right internal jugular vein under direct, real-time ultrasound guidance after the overlying soft tissues were anesthetized with 1% lidocaine with epinephrine. Ultrasound image documentation was performed. A Bentson wire was advanced, through the right atrium into the IVC. Serial dilation was performed. At this point, a retrievable filter was placed from the IJ approach, which is dictated under separate heading. After the IVC filter was placed, Amplatz wire was noted she aided into the IVC. Serial dilation of 18 [place], 20 [place], and 24 [place] was performed at the IJ access site. The proprietary Gore dry seal sheath, 24 French, 33 cm length was placed on the Amplatz wire. Once the sheath was in position, and angled 6 French pigtail catheter was navigated into the pulmonary artery outflow. This was poor formed with a combination of the Bentson wire, back end of the Bentson wire, and the Amplatz wire. Central venous pressure was performed. Once the central venous pressure was performed, the pigtail catheter was removed on a rose in wire with the Rose an wire directed into the lower pulmonary artery branches. The pigtail catheter was removed and then an angled pigtail catheter was used to navigate the wire further distally, for a distal position before placement of the Amplatz wire. Once the catheter was in the distal lower branch, Amplatz wire was placed. Upon initial placement of the catheter  set into the lower pole branches, the dry seal sheath had withdrawn from the IJ puncture site which required manual pressure wall we exchanged the catheter for the dry seal introducer. Dry seal sheath was then replaced after this sequence Jewel exchange, with the wire remaining in the lower pole branches. Once the dry seal sheath was secured with a hemostat and the Amplatz wire was safely within lower pole branches, the 24 [place] INARI device was advanced into the right pulmonary artery branches. The introducer was removed, catheter was advanced to the location of thrombus towards the lower pole branches, and the aspiration syringe was attached. The initial aspiration attempt yielded static flow within the syringe, and we observed a 30 second time frame before withdrawing the  syringe. Small yield of thrombus on the initial attempt was confirmed. Three more aspiration attempts were performed at this time with little further yield of thrombus. The Flowtriever2 was then deployed for additional mechanical thrombectomy advantage, with little yield observed. A final position was then achieved into the upper lobe branches, with advancing a penumbra aspiration catheter and coaxial wire into the upper lobe branches. Aspiration was attempted through penumbra catheter with little yield of thrombus. After 4-5 aspiration attempts, we withdrew from this portion of the case. Aspirin catheters and wires were removed. The IJ dry seal sheath remained until the conclusion of the case after mesenteric angiogram and embolization. This portion of the case is dictated under different heading. After the sheath was removed, stay suture was placed. The right femoral vein sheath was also removed with dry dressing placed. Patient tolerated the procedure well and remained hemodynamically stable throughout. Estimated blood loss: 250 cc FINDINGS: Ultrasound demonstrates patency of right internal jugular vein. Ultrasound at the right inguinal  region demonstrates nonocclusive thrombus of the femoral vein. Initial pulmonary angiogram demonstrates ill-defined filling defects within upper lobe and lower lobe branches on the right. The measured pressure at the beginning of the case is recorded in the nursing notes/flow sheet Small yield of aspirated thrombus with the [facility] flowtriever device. The clot appears to be adherent to the wall indicating some subacute and chronic characteristics Venogram of right lower extremity demonstrates significant iliac thrombus into the lower IVC. IMPRESSION: Status post ultrasound guided access right common femoral vein, and right jugular vein for mechanical and aspiration thrombectomy of pulmonary embolus, with small volume thrombus yield, performed for presentation of massive PE in a patient who is not candidate for anticoagulation. Signed, Dulcy Fanny. Dellia Nims, RPVI Vascular and Interventional Radiology Specialists [facility] Radiology Electronically Signed   By: Corrie Mckusick D.O.   On: 03/08/2020 16:45   IR US Guide Vasc Access Right  Result Date: 03/08/2020 INDICATION: 64 year old male with cardiac arrest, massive PE diagnosis, GI hemorrhage with large gastric ulcer, anti-coagulation is contraindicated. He presents for mesenteric angiogram and possible embolization. Same session PE aspiration thrombectomy. EXAM: US GUIDED ACCESS RIGHT COMMON FEMORAL ARTERY MESENTERIC ANGIOGRAM, CELIAC ARTERY, RIGHT GASTRIC ARTERY, GASTRODUODENAL ARTERY COIL EMBOLIZATION OF RIGHT GASTRIC ARTERY AND GDA EXOSEAL DEPLOYED FOR HEMOSTASIS MEDICATIONS: None ANESTHESIA/SEDATION: Moderate (conscious) sedation was employed during this procedure. A total of Versed 0 mg and Fentanyl 100 mcg was administered intravenously. Moderate Sedation Time: 2 hours 24 minutes minutes. The patient's level of consciousness and vital signs were monitored continuously by radiology nursing throughout the procedure under my direct supervision. CONTRAST:  330 cc  IV contrast. This total reflects the total amount of contrast for 3 separate cases under 1 anesthesia time. FLUOROSCOPY TIME:  Fluoroscopy Time: 31 minutes 12 seconds (1,144 mGy). This fluoroscopy time reflects 3 separate cases performed. COMPLICATIONS: None PROCEDURE: Informed consent was obtained from the patient following explanation of the procedure, risks, benefits and alternatives. The patient understands, agrees and consents for the procedure. All questions were addressed. A time out was performed prior to the initiation of the procedure. Maximal barrier sterile technique utilized including caps, mask, sterile gowns, sterile gloves, large sterile drape, hand hygiene, and Betadine prep. Once the IVC filter was placed and mechanical thrombectomy of the pulmonary arteries was performed, we proceeded with mesenteric angiogram and possible embolization. Ultrasound survey of the right inguinal region was performed with images stored and sent to PACs, confirming patency of the vessel. A micropuncture needle  was used access the right common femoral artery under ultrasound. With excellent arterial blood flow returned, and an .018 micro wire was passed through the needle, observed enter the abdominal aorta under fluoroscopy. The needle was removed, and a micropuncture sheath was placed over the wire. The inner dilator and wire were removed, and an 035 Bentson wire was advanced under fluoroscopy into the abdominal aorta. The sheath was removed and a standard 5 [place] vascular sheath was placed. The dilator was removed and the sheath was flushed. A standard C2 Cobra catheter was passed on the Bentson wire used to select the origin of the celiac artery. Celiac artery origin anatomy did not except a good purchase of these Cobra catheter, and thus the Cobra catheter was exchanged for a Mickelson catheter. Mickelson catheter achieved adequate purchase of the celiac artery origin. Angiogram was performed. 150 cm STC catheter  was advanced with a 14 fathom wire, used to select the common hepatic artery. Fathom wire was advanced into the right gastric artery with the [place] catheter following easily. Angiogram was performed. Coil embolization of the right gastric artery was performed with 2 by 2 mm coils The catheter was then withdrawn and wire was used to select the gastroduodenal artery. Catheter was advanced into the gastroduodenal artery. Angiogram was performed. Coil embolization was then performed of the GDA with combination of 5 mm coils. Final angiogram performed. We then withdrew all catheters wires and deployed Exoseal for hemostasis. Patient remained hemodynamically stable throughout. No complications were encountered and no significant blood loss from this portion of the examination. IMPRESSION: Status post ultrasound guided access right common femoral artery for mesenteric angiogram and empiric coil embolization of right gastric artery and gastroduodenal artery, as the preferential arterial supply to the anatomic location of large gastric ulcer. Signed, Dulcy Fanny. Dellia Nims, RPVI [facility] Radiology Special[facility] Radiology Electronically Signed   By: Corrie Mckusick D.O.   On: 03/08/2020 09:33   IR US Guide Vasc Access Right  Result Date: 03/08/2020 INDICATION: 64 year old male with cardiac arrest, massive PE diagnosis, GI hemorrhage with large gastric ulcer, anti-coagulation is contraindicated. He presents for IVC filter placement, same session PE mechanical/aspiration thrombectomy, and mesenteric angiogram with embolization. EXAM: US GUIDED RIGHT COMMON FEMORAL VEIN ACCESS VENOGRAM IVC FILTER PLACEMENT MEDICATIONS: None. ANESTHESIA/SEDATION: 0 mg IV Versed; 100 mcg IV Fentanyl Moderate Sedation Time:  0 minutes The patient was continuously monitored during the procedure by the interventional radiology nurse under my direct supervision. In addition the patient was intubated with respiratory teen  involved with his care. FLUOROSCOPY TIME:  Fluoroscopy Time: 31 minutes 12 seconds (1,144 mGy). This fluoroscopy time reflects 3 separate cases performed, with separate dictation COMPLICATIONS: None PROCEDURE: The procedure, risks, benefits, and alternatives were explained to the patient. Specific risks discussed include bleeding, infection, contrast reaction, renal failure, IVC filter fracture, migration, iliocaval thrombus (3-4% incidence), need for further procedure, need for further surgery, pulmonary embolism, cardiopulmonary collapse, death. Questions regarding the procedure were encouraged and answered. The patient understands and consents to the procedure. Ultrasound survey was performed with images stored and sent to PACs. The right inguinal region was prepped with chlorhexidine in a sterile fashion, and a sterile drape was applied covering the operative field. A sterile gown and sterile gloves were used for the procedure. Local anesthesia was provided with 1% Lidocaine. We elected to start with access of the right common femoral vein, given our forthcoming attempt at pulmonary embolism thrombectomy with the [facility] device.  A micropuncture needle was used access the right common femoral vein under ultrasound. With excellent venous blood flow returned, and an .018 micro wire was passed through the needle. Small incision was made with an 11 blade scalpel. The needle was removed, and a micropuncture sheath was placed over the wire. The inner dilator and wire were removed, and an 035 Bentson wire was advanced under fluoroscopy into the IVC. Standard 5 French sheath was placed. Venogram was performed. The right jugular region was prepped and draped in the usual sterile fashion. 1% lidocaine was used for local anesthesia. A micropuncture needle was used access the right internal jugular vein under ultrasound. With excellent venous blood flow returned, and an .018 micro wire was passed through the needle. Small  incision was made with an 11 blade scalpel. The needle was removed, and a micropuncture sheath was placed over the wire. The inner dilator and wire were removed, and an 035 Bentson wire was advanced under fluoroscopy into the IVC. Serial dilation of the soft tissue tract was performed with an 8 [place] dilator and subsequently a 10 [place] dilator. The delivery sheath for a retrievable Bard Denali filter was passed over the Bentson wire into the IVC. The wire was removed and small contrast was used to confirm IVC location. IVC cavagram performed. Dilator was removed, and the IVC filter was then delivered, positioned below the lowest renal vein. Repeat cavagram performed, and the catheter was removed. After placement of the filter, we proceeded with serial dilation of the IJ access site, for placement of 12F proprietary Gore dry-seal sheath. Patient tolerated the procedure well and remained hemodynamically stable throughout. No complications were encountered and no significant blood loss was encounter. IMPRESSION: Status post ultrasound guided access right common femoral vein with venogram confirming ilio caval thrombus. Status post ultrasound guided access right internal jugular vein with placement of retrievable IVC filter. We then proceeded with pulmonary angiogram and mechanical/aspiration thrombectomy. This will be dictated under a separate dictation Signed, Dulcy Fanny. 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 PLAN: This IVC filter is potentially retrievable. The patient will be assessed for filter retrieval by Interventional Radiology in approximately 8-12 weeks. Further recommendations regarding filter retrieval, continued surveillance or declaration of device permanence, will be made at that time. Electronically Signed   By: Corrie Mckusick D.O.    On: 03/08/2020 09:27   DG Chest Port 1 View  Result Date: 03/10/2020 CLINICAL DATA:  Acute respiratory failure EXAM: PORTABLE CHEST 1 VIEW COMPARISON:  Yesterday FINDINGS: Endotracheal tube tip is halfway between the clavicular heads and carina. Left subclavian line with tip at the SVC. There is no edema, consolidation, effusion, or pneumothorax. Mild streaky density at the bases. Normal heart size and mediastinal contours. IMPRESSION: 1. Unremarkable hardware positioning. 2. Mild atelectasis at the bases. Electronically Signed   By: Monte Fantasia M.D.   On: 03/10/2020 05:21   DG CHEST PORT 1 VIEW  Result Date: 03/09/2020 CLINICAL DATA:  Acute respiratory failure EXAM: PORTABLE CHEST 1 VIEW COMPARISON:  Portable exam 1531 hours compared to 03/09/2020 FINDINGS: Tip of endotracheal tube projects 2.3 cm above carina. LEFT subclavian central venous catheter with tip projecting over SVC. Normal heart size, mediastinal contours, and pulmonary vascularity. Lungs clear. No pulmonary infiltrate, pleural effusion or pneumothorax. Osseous structures unremarkable. IMPRESSION: No acute abnormalities. Electronically Signed   By: Elta [place]  Thornton Papas M.D.   On: 03/09/2020 15:44   DG Chest Port 1 View  Result Date: 03/09/2020 CLINICAL DATA:  Acute respiratory failure EXAM: PORTABLE CHEST 1 VIEW COMPARISON:  March 08, 2020 FINDINGS: The heart size and mediastinal contours are within normal limits. ETT is 2 cm above the carina. A left-sided PICC is seen with the tip at the superior cavoatrial junction. The lungs are clear. No focal airspace consolidation. No acute osseous abnormality. IMPRESSION: No active disease. Electronically Signed   By: Prudencio Pair M.D.   On: 03/09/2020 05:47   DG Chest Port 1 View  Result Date: 03/08/2020 CLINICAL DATA:  Acute respiratory failure EXAM: PORTABLE CHEST 1 VIEW COMPARISON:  None. FINDINGS: The heart size and mediastinal contours are within normal limits. ETT is 1.7 cm above the carina. A  left-sided PICC is seen with the tip at the lower SVC. The lungs are clear. No focal airspace consolidation or pleural effusion. No acute osseous abnormality. Both lungs are clear. The visualized skeletal structures are unremarkable. IMPRESSION: No active disease. Electronically Signed   By: Prudencio Pair M.D.   On: 03/08/2020 06:09   DG Chest Port 1 View  Result Date: 03/07/2020 CLINICAL DATA:  Central line placement EXAM: PORTABLE CHEST 1 VIEW COMPARISON:  03/07/2020 at 0654 hours FINDINGS: Interval placement of a left subclavian approach central venous catheter with distal tip terminating at the level of the distal SVC. ET tube terminates 2.5 cm superior to the carina. Enteric tube courses below the diaphragm with distal tip and side hole beyond the inferior margin of the film. Normal heart size. No focal airspace consolidation, pleural effusion, or pneumothorax. IMPRESSION: Interval placement of a left subclavian approach central venous catheter with distal tip terminating at the level of the distal SVC. No pneumothorax. Electronically Signed   By: Davina Poke D.O.   On: 03/07/2020 13:19   DG Chest Portable 1 View  Result Date: 03/07/2020 CLINICAL DATA:  Intubation.  Respiratory distress EXAM: PORTABLE CHEST 1 VIEW COMPARISON:  None. FINDINGS: Endotracheal tube tip is just below the clavicular heads. Normal heart size and mediastinal contours. There is no edema, consolidation, effusion, or pneumothorax. IMPRESSION: 1. Unremarkable endotracheal tube positioning. 2. No visible cardiopulmonary disease. Electronically Signed   By: Monte Fantasia M.D.   On: 03/07/2020 07:11   EEG adult  Result Date: 03/07/2020 Lora Havens, MD     03/07/2020  1:45 PM Patient Name: Jeevan Kalla MRN: [id number] Epilepsy Attending: Lora Havens Referring Physician/Provider: Dr. Marshell Garfinkel Date: 03/07/2020 Duration: 22.25 minutes Patient history: 64 year old male presented after cardiac arrest.  EEG to evaluate  for seizures. Level of alertness: Comatose AEDs during EEG study: Propofol Technical aspects: This EEG study was done with scalp electrodes positioned according to the 10-20 International system of electrode placement. Electrical activity was acquired at a sampling rate of 500Hz  and reviewed with a high frequency filter of 70Hz  and a low frequency filter of 1Hz . EEG data were recorded continuously and digitally stored. Description: EEG showed continuous generalized low amplitude 3-6Hz  theta-delta slowing with overriding 15 to 18 Hz, 2-3 uV beta activity distributed symmetrically and diffusely.  Hyperventilation and photic stimulation were not performed. Abnormality -Continuous slow, generalized -Excessive beta, generalized IMPRESSION: This study is suggestive of severe to profound diffuse encephalopathy, nonspecific etiology but likely secondary to sedation.  No seizures or epileptiform discharges were seen throughout the recording. Priyanka Barbra Sarks   Overnight EEG with video  Result Date: 03/08/2020 Lora Havens, MD  03/08/2020  2:30 PM Patient Name: Rodolfo Gaster MRN: [id number] Epilepsy Attending: Lora Havens Referring Physician/Provider: Dr. Marshell Garfinkel Duration: 03/07/2020 1321 to 03/08/2020 1307  Patient history: 64 year old male presented after cardiac arrest.  EEG to evaluate for seizures.  Level of alertness: Comatose  AEDs during EEG study: Propofol  Technical aspects: This EEG study was done with scalp electrodes positioned according to the 10-20 International system of electrode placement. Electrical activity was acquired at a sampling rate of 500Hz  and reviewed with a high frequency filter of 70Hz  and a low frequency filter of 1Hz . EEG data were recorded continuously and digitally stored.  Description: EEG initially showed continuous generalized low amplitude 3-6Hz  theta-delta slowing with overriding 15 to 18 Hz, 2-3 uV beta activity distributed symmetrically and diffusely. Gradually  eeg showed predominantly 4-8hz  generalized theta-alpha activity.  Hyperventilation and photic stimulation were not performed.  Abnormality -Continuous slow, generalized -Excessive beta, generalized  IMPRESSION: This study is suggestive of moderate to severe diffuse encephalopathy, nonspecific etiology but could be secondary to sedation.  No seizures or epileptiform discharges were seen throughout the recording. EEG appeared to be improving throughout the study. Lora Havens   ECHOCARDIOGRAM COMPLETE  Result Date: 03/07/2020    ECHOCARDIOGRAM REPORT   Patient Name:   DEVIN GANAWAY Date of Exam: 03/07/2020 Medical Rec #:  [id number]    Height:       74.0 in Accession #:    [id number]   Weight:       160.0 lb Date of Birth:  [birth year]    BSA:          1.977 m Patient Age:    27 years     BP:           109/89 mmHg Patient Gender: M            HR:           116 bpm. Exam Location:  Inpatient Procedure: 2D Echo, Cardiac Doppler and Color Doppler Indications:    Cardiac arrest I46.9  History:        Patient has no prior history of Echocardiogram examinations.                 Risk Factors:Current Smoker.  Sonographer:    Vickie Epley RDCS Referring Phys: [id number] [facility]  Sonographer Comments: Echo performed with patient supine and on artificial respirator and Technically challenging study due to limited acoustic windows. Image acquisition challenging due to respiratory motion. IMPRESSIONS  1. Very difficult study with poor windows. The RV appears severely dilated with severely reduced function. D shaped septum. Overall, concerns for pulmonary embolism in this patient with cardiac arrest. Clinical correlation is recommended.  2. Left ventricular ejection fraction, by estimation, is 60 to 65%. The left ventricle has normal function. Left ventricular endocardial border not optimally defined to evaluate regional wall motion. Left ventricular diastolic function could not be evaluated. There is the interventricular  septum is flattened in systole and diastole, consistent with right ventricular pressure and volume overload.  3. Right ventricular systolic function is severely reduced. The right ventricular size is severely enlarged.  4. The mitral valve is grossly normal. No evidence of mitral valve regurgitation. No evidence of mitral stenosis.  5. The aortic valve is grossly normal. Aortic valve regurgitation is not visualized. No aortic stenosis is present.  6. Right atrial size was mildly dilated. FINDINGS  Left Ventricle: Left ventricular ejection fraction, by estimation, is 60 to 65%. The left ventricle has  normal function. Left ventricular endocardial border not optimally defined to evaluate regional wall motion. The left ventricular internal cavity size was small. There is no left ventricular hypertrophy. The interventricular septum is flattened in systole and diastole, consistent with right ventricular pressure and volume overload. Left ventricular diastolic function could not be evaluated due to nondiagnostic images. Left ventricular diastolic function could not be evaluated. Right Ventricle: The right ventricular size is severely enlarged. No increase in right ventricular wall thickness. Right ventricular systolic function is severely reduced. Left Atrium: Left atrial size was normal in size. Right Atrium: Right atrial size was mildly dilated. Pericardium: There is no evidence of pericardial effusion. Mitral Valve: The mitral valve is grossly normal. No evidence of mitral valve regurgitation. No evidence of mitral valve stenosis. Tricuspid Valve: The tricuspid valve is grossly normal. Tricuspid valve regurgitation is mild . No evidence of tricuspid stenosis. Aortic Valve: The aortic valve is grossly normal. Aortic valve regurgitation is not visualized. No aortic stenosis is present. Pulmonic Valve: The pulmonic valve was grossly normal. Pulmonic valve regurgitation is not visualized. No evidence of pulmonic stenosis.  Aorta: The aortic root is normal in size and structure. Venous: IVC assessment for right atrial pressure unable to be performed due to mechanical ventilation. IAS/Shunts: No atrial level shunt detected by color flow Doppler. Additional Comments: Very difficult study with poor windows. The RV appears severely dilated with severely reduced function. D shaped septum. Overall, concerns for pulmonary embolism in this patient with cardiac arrest. Clinical correlation is recommended.  LEFT VENTRICLE PLAX 2D LVOT diam:     2.00 cm LVOT Area:     3.14 cm  RIGHT VENTRICLE TAPSE (M-mode): 1.1 cm RIGHT ATRIUM          Index RA Area:     7.97 cm RA Volume:   15.10 ml 7.64 ml/m  MV E velocity: 61.70 cm/s  TRICUSPID VALVE                            TR Peak grad:   25.6 mmHg                            TR Vmax:        253.00 cm/s                             SHUNTS                            Systemic Diam: 2.00 cm Eleonore Chiquito MD Electronically signed by Eleonore Chiquito MD Signature Date/Time: 03/07/2020/9:57:03 AM    Final    IR EMBO ART  VEN HEMORR LYMPH EXTRAV  INC GUIDE ROADMAPPING  Result Date: 03/08/2020 INDICATION: 64 year old male with cardiac arrest, massive PE diagnosis, GI hemorrhage with large gastric ulcer, anti-coagulation is contraindicated. He presents for mesenteric angiogram and possible embolization. Same session PE aspiration thrombectomy. EXAM: US GUIDED ACCESS RIGHT COMMON FEMORAL ARTERY MESENTERIC ANGIOGRAM, CELIAC ARTERY, RIGHT GASTRIC ARTERY, GASTRODUODENAL ARTERY COIL EMBOLIZATION OF RIGHT GASTRIC ARTERY AND GDA EXOSEAL DEPLOYED FOR HEMOSTASIS MEDICATIONS: None ANESTHESIA/SEDATION: Moderate (conscious) sedation was employed during this procedure. A total of Versed 0 mg and Fentanyl 100 mcg was administered intravenously. Moderate Sedation Time: 2 hours 24 minutes minutes. The patient's level of consciousness and vital signs were monitored continuously by  radiology nursing throughout the procedure under  my direct supervision. CONTRAST:  330 cc IV contrast. This total reflects the total amount of contrast for 3 separate cases under 1 anesthesia time. FLUOROSCOPY TIME:  Fluoroscopy Time: 31 minutes 12 seconds (1,144 mGy). This fluoroscopy time reflects 3 separate cases performed. COMPLICATIONS: None PROCEDURE: Informed consent was obtained from the patient following explanation of the procedure, risks, benefits and alternatives. The patient understands, agrees and consents for the procedure. All questions were addressed. A time out was performed prior to the initiation of the procedure. Maximal barrier sterile technique utilized including caps, mask, sterile gowns, sterile gloves, large sterile drape, hand hygiene, and Betadine prep. Once the IVC filter was placed and mechanical thrombectomy of the pulmonary arteries was performed, we proceeded with mesenteric angiogram and possible embolization. Ultrasound survey of the right inguinal region was performed with images stored and sent to PACs, confirming patency of the vessel. A micropuncture needle was used access the right common femoral artery under ultrasound. With excellent arterial blood flow returned, and an .018 micro wire was passed through the needle, observed enter the abdominal aorta under fluoroscopy. The needle was removed, and a micropuncture sheath was placed over the wire. The inner dilator and wire were removed, and an 035 Bentson wire was advanced under fluoroscopy into the abdominal aorta. The sheath was removed and a standard 5 [place] vascular sheath was placed. The dilator was removed and the sheath was flushed. A standard C2 Cobra catheter was passed on the Bentson wire used to select the origin of the celiac artery. Celiac artery origin anatomy did not except a good purchase of these Cobra catheter, and thus the Cobra catheter was exchanged for a Mickelson catheter. Mickelson catheter achieved adequate purchase of the celiac artery origin.  Angiogram was performed. 150 cm STC catheter was advanced with a 14 fathom wire, used to select the common hepatic artery. Fathom wire was advanced into the right gastric artery with the [place] catheter following easily. Angiogram was performed. Coil embolization of the right gastric artery was performed with 2 by 2 mm coils The catheter was then withdrawn and wire was used to select the gastroduodenal artery. Catheter was advanced into the gastroduodenal artery. Angiogram was performed. Coil embolization was then performed of the GDA with combination of 5 mm coils. Final angiogram performed. We then withdrew all catheters wires and deployed Exoseal for hemostasis. Patient remained hemodynamically stable throughout. No complications were encountered and no significant blood loss from this portion of the examination. IMPRESSION: Status post ultrasound guided access right common femoral artery for mesenteric angiogram and empiric coil embolization of right gastric artery and gastroduodenal artery, as the preferential arterial supply to the anatomic location of large gastric ulcer. Signed, Dulcy Fanny. Dellia Nims, RPVI Vascular and Interventional Radiology Specialists [facility] Radiology Electronically Signed   By: Corrie Mckusick D.O.   On: 03/08/2020 09:33   VAS [place] LOWER EXTREMITY VENOUS (DVT)  Result Date: 03/08/2020  Lower Venous DVTStudy Indications: Swelling.  Limitations: Poor ultrasound/tissue interface, bandages, line and arctic sun device. Comparison Study: IR procedure 03-07-20, positive IVC and right iliac vein. Performing Technologist: Baldwin Crown ARDMS, RVT  Examination Guidelines: A complete evaluation includes B-mode imaging, spectral Doppler, color Doppler, and power Doppler as needed of all accessible portions of each vessel. Bilateral testing is considered an integral part of a complete examination. Limited examinations for reoccurring indications may be performed as noted. The reflux portion of the  exam is performed with the patient  in reverse Trendelenburg.  +---------+---------------+---------+-----------+----------+--------------+ RIGHT    CompressibilityPhasicitySpontaneityPropertiesThrombus Aging +---------+---------------+---------+-----------+----------+--------------+ CFV      None           No       No                   Acute          +---------+---------------+---------+-----------+----------+--------------+ SFJ      None                                                        +---------+---------------+---------+-----------+----------+--------------+ FV Prox                                               Not visualized +---------+---------------+---------+-----------+----------+--------------+ FV Mid                                                Not visualized +---------+---------------+---------+-----------+----------+--------------+ FV Distal                                             Not visualized +---------+---------------+---------+-----------+----------+--------------+ PFV                                                   Not visualized +---------+---------------+---------+-----------+----------+--------------+ POP      None           No       No                                  +---------+---------------+---------+-----------+----------+--------------+ PTV      None                                                        +---------+---------------+---------+-----------+----------+--------------+ PERO     None                                                        +---------+---------------+---------+-----------+----------+--------------+   +---------+---------------+---------+-----------+----------+--------------+ LEFT     CompressibilityPhasicitySpontaneityPropertiesThrombus Aging +---------+---------------+---------+-----------+----------+--------------+ CFV      None           No       No                    Acute          +---------+---------------+---------+-----------+----------+--------------+ SFJ  Not visualized +---------+---------------+---------+-----------+----------+--------------+ FV Prox                                               Not visualized +---------+---------------+---------+-----------+----------+--------------+ FV Mid                                                Not visualized +---------+---------------+---------+-----------+----------+--------------+ FV Distal                                             Not visualized +---------+---------------+---------+-----------+----------+--------------+ PFV                                                   Not visualized +---------+---------------+---------+-----------+----------+--------------+ POP      None           No       No                                  +---------+---------------+---------+-----------+----------+--------------+ PTV      None                                                        +---------+---------------+---------+-----------+----------+--------------+ PERO     None                                                        +---------+---------------+---------+-----------+----------+--------------+ Gastroc  None                                                        +---------+---------------+---------+-----------+----------+--------------+ CIV                                                   Not visualized +---------+---------------+---------+-----------+----------+--------------+ EIV                                                   Not visualized +---------+---------------+---------+-----------+----------+--------------+ Unable to visualize bilateral femoral and iliac veins due to arctic sun device. Poorly visualized bilateral calf veins.    Summary: RIGHT: - Findings consistent with acute deep vein  thrombosis involving the right common femoral vein, SF junction,  right popliteal vein, right posterior tibial veins, and right peroneal veins. However, portions of this examination were limited- see technologist  comments above.  LEFT: - Findings consistent with acute deep vein thrombosis involving the left common femoral vein, left popliteal vein, left posterior tibial veins, and left peroneal veins. However, portions of this examination were limited- see technologist comments above.  *See table(s) above for measurements and observations. Electronically signed by Ruta Hinds MD on 03/08/2020 at 4:02:44 PM.    Final     Labs:  CBC: Recent Labs    03/09/20 0300 03/09/20 0300 03/09/20 1536 03/09/20 1603 03/10/20 0518 03/10/20 1100  WBC 10.6*  --   --  10.7* 12.1* 11.1*  HGB 9.9*   < > 11.9* 10.4* 10.8* 10.7*  HCT 30.5*   < > 35.0* 32.5* 33.8* 33.1*  PLT 141*  --   --  153 163 171   < > = values in this interval not displayed.    COAGS: Recent Labs    03/07/20 1243 03/08/20 0337  INR 1.3* 1.1  APTT 39*  --     BMP: Recent Labs    03/08/20 2100 03/09/20 0025 03/09/20 0300 03/09/20 1536 03/10/20 0518 03/10/20 1100  NA 135   < > 135 134* 135 136  K 4.0   < > 4.0 4.1 4.2 4.4  CL 107  --  106  --  104 105  CO2 19*  --  20*  --  19* 22  GLUCOSE 91  --  95  --  62* 67*  BUN 7*  --  8  --  11 10  CALCIUM 8.1*  --  7.9*  --  8.5* 8.4*  CREATININE 0.77  --  0.77  --  0.98 0.90  GFRNONAA >60  --  >60  --  >60 >60  GFRAA >60  --  >60  --  >60 >60   < > = values in this interval not displayed.    LIVER FUNCTION TESTS: Recent Labs    03/07/20 0710 03/08/20 0337 03/10/20 0518 03/10/20 1100  BILITOT 0.6 0.3 0.8 0.7  AST 60* 154* 58* 51*  ALT 29 93* 52* 47*  ALKPHOS 55 69 64 60  PROT 4.4* 4.9* 5.6* 5.4*  ALBUMIN 1.9* 2.0* 2.3* 2.2*    Assessment and Plan:  History of cardiac arrest secondary to: 1- massive PE s/p attempted mechanical and aspiration thrombectomy of  PE with small volume thrombus yield along with IVCF placement (for DVT), and 2- GI hemorrhage secondary to large gastric ulcer s/p mesenteric arteriogram with coil embolization of right gastric artery and gastroduodenal artery 03/07/2020 by Dr. Earleen Newport. Hgb stable at 11.1 today. Right groin incision stable. Right IJ suture removed, dressing (gauze and tape) applied to site. Further plans per CCM/cardiology/GI- appreciate and agree with management. Please call IR with questions/concerns.   Electronically Signed: Earley Abide, PA-C 03/10/2020, 12:59 PM   I spent a total of 25 Minutes at the the patient's bedside AND on the patient's hospital floor or unit, greater than 50% of which was counseling/coordinating care for embolization follow-up.

## 2020-03-10 NOTE — Progress Notes (Signed)
ANTICOAGULATION CONSULT NOTE  Pharmacy Consult:  Heparin Indication: pulmonary embolus  No Known Allergies  Patient Measurements: Height: 6\' 2"  (188 cm) Weight: 176 lb 12.9 oz (80.2 kg) IBW/kg (Calculated) : 82.2 Heparin Dosing Weight: 73 kg  Vital Signs: Temp: 98.6 F (37 C) (03/26 0400) Temp Source: Bladder (03/26 0400) BP: 162/87 (03/26 0354) Pulse Rate: 107 (03/26 0530)  Labs: Recent Labs    03/07/20 0710 03/07/20 1144 03/07/20 1243 03/07/20 1944 03/08/20 0300 03/08/20 0337 03/08/20 0342 03/08/20 1800 03/08/20 2100 03/08/20 2100 03/09/20 0025 03/09/20 0300 03/09/20 0300 03/09/20 0405 03/09/20 0957 03/09/20 1536 03/09/20 1536 03/09/20 1603 03/10/20 0518  HGB 12.0*   < > 13.3   < >  --  12.0*   < >  --   --   --    < > 9.9*   < >  --   --  11.9*   < > 10.4* 10.8*  HCT 40.9   < > 41.0   < >  --  36.1*   < >  --   --   --    < > 30.5*   < >  --   --  35.0*  --  32.5* 33.8*  PLT 100*  --  161   < >   < > 153  --   --   --   --   --  141*  --   --   --   --   --  153 163  APTT  --   --  39*  --   --   --   --   --   --   --   --   --   --   --   --   --   --   --   --   LABPROT  --   --  16.1*  --   --  14.4  --   --   --   --   --   --   --   --   --   --   --   --   --   INR  --   --  1.3*  --   --  1.1  --   --   --   --   --   --   --   --   --   --   --   --   --   HEPARINUNFRC  --   --   --    < >   < >  --   --   --  0.43   < >  --   --   --  0.50 0.37  --   --   --  0.56  CREATININE 1.19  --  0.90   < >  --  0.77   < > 0.80 0.77  --   --  0.77  --   --   --   --   --   --   --   TROPONINIHS 73*  --  8,728*  --   --   --   --   --   --   --   --   --   --   --   --   --   --   --   --    < > = values in this interval not displayed.    Estimated Creatinine Clearance:  108.6 mL/min (by C-G formula based on SCr of 0.77 mg/dL).   Assessment: 45 YOM presented with asystolic arrest from bilateral PEs.  He also has right iliac and iliocaval DVTs.  Patient had  maroon stools and bloody output from NG upon arrival to ICU. Upper endoscopy on 03/07/20 showed large cratered oozing gastric ulcer. Went to IR for IVC filter placement and attempted mechanical aspiration of PE. Underwent US CFA access for mesenteric angiogram as well on 03/07/20.  Patient has a high risk for bleeding as well as decompensation and repeat cardiac arrest.  Pharmacy consulted to manage IV heparin.  Maroon stools resolved.  Heparin level this morning is slightly SUPRAtherapeutic of lower goal range (HL 0.56 << 0.37, goal of 0.3-0.5). Hgb/Hct stable. No issues or bleeding noted per RN.   Goal of Therapy: Heparin level 0.3-0.5 units/mL   Plan:  - Reduce Heparin drip to 950 units/hr (9.5 ml/hr) - Will continue to monitor for any signs/symptoms of bleeding and will follow up with heparin level in 6 hours   Thank you for allowing pharmacy to be a part of this patient's care.  Alycia Rossetti, PharmD, BCPS Clinical Pharmacist Clinical phone for  03/10/2020 6:16 AM   **Pharmacist phone directory can now be found on [email or web address] ([email or web address]).  Listed under [place].

## 2020-03-10 NOTE — Progress Notes (Signed)
Initial Nutrition Assessment  DOCUMENTATION CODES:   Not applicable  INTERVENTION:   TPN per Pharmacy  No ILE at this time as pt on propofol  TPN to meet >90% of estimated needs   NUTRITION DIAGNOSIS:   Inadequate oral intake related to inability to eat as evidenced by NPO status.  Being addressed via TPN  GOAL:   Patient will meet greater than or equal to 90% of their needs  Progressing  MONITOR:   Vent status, Labs, I & O's  REASON FOR ASSESSMENT:   Ventilator    ASSESSMENT:   64 yo male admitted S/P cardiac arrest, S/P normothermia protocol. Found to have massive PE, DVT, GI bleed from gastric ulcer. PMH includes current smoker, heavy alcohol use.  3/23 Admitted, EGD: gastric ulcer requiring embolectomy; IVC filter placed 3/26 TPN initiated  Noted plan to initiate TPN as GI recommending no feeds or NG tube placement for 2 weeks. Agree with initiation of TPN at this time given possible no enteral nutrition x 2 weeks  Patient is currently intubated on ventilator support MV: 21 L/min Temp (24hrs), Avg:98.5 F (36.9 C), Min:97.3 F (36.3 C), Max:99.5 F (37.5 C)  Propofol: 14.6 ml/hr  Noted hypoglycemic episodes; initiation of TPN should resolve this  Current weight 85.2 kg; Admit weight 73.6 kg. Net +12 L. Noted generalized 2+ (moderate) edema. Plan to utilize 73.6 kg as EDW at present.   Labs: CBGs 56-116 Meds: reviewed   Diet Order:   Diet Order            Diet NPO time specified  Diet effective now              EDUCATION NEEDS:   Not appropriate for education at this time  Skin:  Skin Assessment: Reviewed RN Assessment(incisions to neck and groin)  Last BM:  3/25 rectal tube  Height:   Ht Readings from Last 1 Encounters:  03/07/20 6\' 2"  (1.88 m)    Weight:   Wt Readings from Last 1 Encounters:  03/10/20 85.2 kg    Ideal Body Weight:  86.4 kg  BMI:  Body mass index is 24.12 kg/m.  Estimated Nutritional Needs:   Kcal:   2247 kcals  Protein:  110-135 g  Fluid:  >/= 2 L    Kerman Passey MS, RDN, LDN, CNSC RD Pager Number and Weekend/On-Call After Hours Pager Located in [place]

## 2020-03-10 NOTE — Progress Notes (Signed)
NAME:  Kenneth Sullivan, MRN:  [id number], DOB:  July 23, 1956, LOS: 3 ADMISSION DATE:  03/07/2020, CONSULTATION DATE:  03/07/20 REFERRING MD:  Dr Roxanne Mins MD, CHIEF COMPLAINT:  Cardiac arrest  Brief History   64 year old with no significant past medical history.  Had acute shortness of breath with sats in the 40s on EMS arrival.  He had a witnessed bradycardic, asystolic arrest when EMS was present with 3 rounds of epi, 20 minutes to ROSC. Underwent normothermia protocol.  Work-up significant for massive PE, DVT with RV strain, GI bleed from gastric ulcer.  Past Medical History  Has not seen a doctor.  No past medical history  Smokes 1 pack/day, drinks 2 to 340 ounce beers every day.  Smokes marijuana.  Denies any other recreational drug use.  Significant Hospital Events   3/23-Admit, underwent EGD with findings of oozing gastric ulcer with clot, IR for mechanical thrombectomy, IVC filter and GDA embolization 3/24- Started heparin drip  Consults:  Cardiology, GI, interventional radiology  Procedures:    Significant Diagnostic Tests:  Chest x-ray 3/23-ET tube in stable portion, no acute cardiopulmonary abnormality. CTA 3/23-bilateral PE with RV/with ratio 1.9, emphysema CT head 3/23-chronic microvascular changes.  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 Lower extremity duplex  Micro Data:  Bcx 3/23-negative [name] 3/23-negative  Antimicrobials:   Interim history/subjective:  Remains intubated and on pressors  Desaturated with PSV, now back on PRVC Continues on normothermia today via arctic sun   Sedation weaned significantly this morning to propofol @ 50   Objective   Blood pressure (!) 162/87, pulse 100, temperature 98.1 F (36.7 C), temperature source Bladder, resp. rate 17, height 6\' 2"  (1.88 m), weight 85.2 kg, SpO2 97 %. CVP:  [4 mmHg-8 mmHg] 8 mmHg  Vent Mode: PRVC FiO2 (%):  [40 %] 40 % Set Rate:  [15  bmp] 15 bmp Vt Set:  [650 mL] 650 mL PEEP:  [5 cmH20] 5 cmH20 Plateau Pressure:  [18 cmH20-25 cmH20] 23 cmH20   Intake/Output Summary (Last 24 hours) at 03/10/2020 0758 Last data filed at 03/10/2020 0700 Gross per 24 hour  Intake 3951.29 ml  Output 513 ml  Net 3438.29 ml   Filed Weights   03/08/20 0419 03/09/20 0339 03/10/20 0500  Weight: 73.6 kg 80.2 kg 85.2 kg    Examination: Gen:   Chronically and critically ill appearing male, appears older than stated age. Intubated, sedated.  HEENT:  NCAT. ETT secure. Anicteric sclera.  Neck:    No masses; no thyromegaly, ETT Lungs:   Clear to auscultation bilaterally; normal respiratory effort CV:    Regular rate and rhythm; no murmurs Abd:  + bowel sounds; soft, non-tender; no palpable masses, no distension Ext:    RLE edema > LLE edema. BUE 1+ edema. No obvious joint deformity  Skin:     Clean, dry, without rash. Arctic sun pads secure and in place.  Neuro: Sedated at time of exam. Does not follow commands. Pupils are sluggish. Patient does not blink to threat. Questionable extensor posturing with painful stimulation seen in [facility] Problem list     Assessment & Plan:   Cardiac arrest with asystole secondary to massive  PE -downtime 20 minutes -initial CT H without acute abnormality P -Normothermia -Weaning sedation for better assessment of neurologic status. Consider MRI brain when off arctic sun if without clinical neurologic improvement when sedation weaned  Acute respiratory failure secondary to massive  pulmonary embolism with acute cor pulmonale -Underwent mechanical thrombectomy and IVC filter- yield on the thrombectomy was minimal as clot was adherent and he likely has subacute clot -Not a candidate for ECMO P -Continue heparin, appreciate pharmacist assistance -Monitor for s/sx bleeding -Continue MV support  -AM CXR -VAP bundle   Shock -circulatory shock in setting of cardiac arrest, possible component  of obstructive in setting of PE now s/p thrombectomy -suspect adequate volume resuscitation at this time P -NE for MAP goal > 65  -Decreasing mIVF rate  -Continue Heparin for PE as above   Acute GI bleed, gastric ulcer Not amenable for endoscopic intervention Underwent GDA embolization.  No evidence of recurrent bleed though hemoglobin is lower today P -Close monitoring of CBC, especially in setting of heparin gtt -If bleeds, may need consideration of gastrectomy  -Continue PPI drip. -GI rec to pursue TPN and avoid gastric tubes x 2 weeks   Transaminitis, mild -likely in setting of shock P -Trend LFTs PRN -if bleeding occurs, also check coags   Lower extremity DVT S/p IVC filter.   P -Can be removed in 3 months if he survives this admission.   Hypoglycemia P -changing LR to D5LR. Will dc this when TPN initiated  Inadequate PO intake -inability to pursue EN per GI rec P -TPN per pharmacist, appreciate assistance   Goals of Care -PCCM has discussed in detail with next of kin--niece [name] over telephone and she understands that he remains critically ill with guarded prognosis.  -Greatest outstanding question may be neurologic status at this time--weaning sedation 3/26   Best practice:  Diet: TPN  Pain/Anxiety/Delirium protocol (if indicated): Propofol, fentanyl as needed.  RASS goal -1 - VAP protocol (if indicated): Ordered DVT prophylaxis: Heparin gtt  GI prophylaxis: Pepcid Glucose control: SSI Mobility: Bed Code Status: Full Family Communication: Pending 3/26 Disposition: ICU   CRITICAL CARE Performed by: Cristal Generous   Total critical care time: 46 minutes  Critical care time was exclusive of separately billable procedures and treating other patients.  Critical care was necessary to treat or prevent imminent or life-threatening deterioration.  Critical care was time spent personally by me on the following activities: development of treatment plan with  patient and/or surrogate as well as nursing, discussions with consultants, evaluation of patient's response to treatment, examination of patient, obtaining history from patient or surrogate, ordering and performing treatments and interventions, ordering and review of laboratory studies, ordering and review of radiographic studies, pulse oximetry and re-evaluation of patient's condition.  Eliseo Gum MSN, AGACNP-BC [place] [phone] If no answer, [id number] 03/10/2020, 7:59 AM

## 2020-03-10 NOTE — Progress Notes (Signed)
PHARMACY - TOTAL PARENTERAL NUTRITION CONSULT NOTE   Indication: Gastric ulcer, GIB. GI is reccomending no feeds/no NGT for 2 weeks  Patient Measurements: Height: 6\' 2"  (188 cm) Weight: 187 lb 13.3 oz (85.2 kg)(pt with TTM equipment hooked up) IBW/kg (Calculated) : 82.2   Body mass index is 24.12 kg/m.  Assessment: 64 yo with cardiac arrest on arrival to hospital; Upper GI bleed from large distal gastric ulcer s/p embolization without further bleeding. On IV Heparin for bilateral PEs - s/p thrombectomy and IVC filter placement. Avoid any gastric tubes for 2 weeks from admit and avoid enteral feeding during this time as well.  Glucose / Insulin: BS 62 to 116, no history of diabetes Electrolytes: K 4.2, Phos 3.9, Mag 1.8 Renal: Scr 0.98 LFTs / TGs: LFTs slightly elevated 58/52, Tbili WNL, TGs 109 Prealbumin / albumin: Prealbumin 6.7, Alb 2.3 Intake / Output; MIVF: UOP 0.3 ml/kg/hr; LR @ 100 ml/hr + multiple drips. Propofol running at 50 mcg/kg/min GI Imaging: None since TPN started Surgeries / Procedures: None since TPN started  Central access: CVC Triple Lumen 3/23 TPN start date: 3/26  Nutritional Goals (per PharmD calculations on 3/26): kCal: 2130 - 2556 kcal/day, Protein: 110 - 136 gm/day, Fluid: >2.5 L/day  Goal TPN rate is 105 mL/hr (provides 120 g of protein and 2300 kcals per day)  Current Nutrition:  NPO  Propofol @ 50 mcg/kg/min = 673 kcals/day  Plan:  Start TPN at 50 mL/hr at 1800 Will hold the addition of any lipid emulsion in the TPN for first 7 days for critically ill patients per ASPEN guidelines (Start date 4/2) Electrolytes in TPN: 9mEq/L of Na, 44mEq/L of K, 54mEq/L of Ca, 38mEq/L of Mg, and 22mmol/L of Phos. Cl:Ac ratio 1:2 Add standard MVI on MWF due to national shortage and trace elements to TPN Initiate Sensitive q6h SSI and adjust as needed  Reduce MIVF to 50 mL/hr at 1800 Monitor TPN labs daily with initiation and on Mon/Thurs  Alanda Slim, PharmD,  [facility] Clinical Pharmacist Please see AMION for all Pharmacists' Contact Phone Numbers 03/10/2020, 7:33 AM

## 2020-03-10 NOTE — Progress Notes (Signed)
PCCM Family Communication Note  Attempted to reach patient's NOK/neice without success to provide daily updates.  Will attempt as able throughout day.   Eliseo Gum MSN, AGACNP-BC [place] [phone] If no answer, [id number] 03/10/2020, 10:36 AM

## 2020-03-10 NOTE — Progress Notes (Signed)
Progress Note  Patient Name: Kenneth Sullivan Date of Encounter: 03/10/2020  Primary Cardiologist:   Minus Breeding, MD   Subjective   Intubated and sedated.   Inpatient Medications    Scheduled Meds: . chlorhexidine gluconate (MEDLINE KIT)  15 mL Mouth Rinse BID  . Chlorhexidine Gluconate Cloth  6 each Topical Q0600  . insulin aspart  0-9 Units Subcutaneous Q4H  . mouth rinse  15 mL Mouth Rinse 10 times per day  . pantoprazole  40 mg Intravenous Q12H   Continuous Infusions: . sodium chloride 10 mL/hr at 03/10/20 0800  . heparin 950 Units/hr (03/10/20 0800)  . lactated ringers 100 mL/hr at 03/10/20 0800  . norepinephrine (LEVOPHED) Adult infusion 2 mcg/min (03/10/20 0800)  . pantoprozole (PROTONIX) infusion 8 mg/hr (03/10/20 0800)  . propofol (DIPRIVAN) infusion 50 mcg/kg/min (03/10/20 0803)  . TPN ADULT (ION)     PRN Meds: docusate sodium, fentaNYL (SUBLIMAZE) injection, gelatin adsorbable, midazolam   Vital Signs    Vitals:   03/10/20 0600 03/10/20 0630 03/10/20 0700 03/10/20 0800  BP:      Pulse: (!) 112  100 (!) 118  Resp: (!) 23 18 17  (!) 35  Temp: 98.1 F (36.7 C)   99.3 F (37.4 C)  TempSrc: Bladder   Bladder  SpO2: 100%  97% 90%  Weight:      Height:        Intake/Output Summary (Last 24 hours) at 03/10/2020 0824 Last data filed at 03/10/2020 0800 Gross per 24 hour  Intake 4099.65 ml  Output 548 ml  Net 3551.65 ml   Filed Weights   03/08/20 0419 03/09/20 0339 03/10/20 0500  Weight: 73.6 kg 80.2 kg 85.2 kg    Telemetry    NSR, sinus tach - Personally Reviewed  ECG    NA- Personally Reviewed  Physical Exam   GEN: No  acute distress.   Neck: No  JVD Cardiac: RRR, no murmurs, rubs, or gallops.  Respiratory: Clear   to auscultation bilaterally. GI: Soft, nontender, non-distended, normal bowel sounds  MS:  Mild lower extremity edema edema; No deformity. Neuro:   Intubated and sedated.     Labs    Chemistry Recent Labs  Lab  03/07/20 0710 03/07/20 1144 03/08/20 0337 03/08/20 0342 03/08/20 2100 03/09/20 0025 03/09/20 0300 03/09/20 1536 03/10/20 0518  NA 138   < > 138   < > 135   < > 135 134* 135  K 3.8   < > 3.9   < > 4.0   < > 4.0 4.1 4.2  CL 104   < > 109   < > 107  --  106  --  104  CO2 14*   < > 21*   < > 19*  --  20*  --  19*  GLUCOSE 291*   < > 96   < > 91  --  95  --  62*  BUN 7*   < > 7*   < > 7*  --  8  --  11  CREATININE 1.19   < > 0.77   < > 0.77  --  0.77  --  0.98  CALCIUM 8.0*   < > 7.9*   < > 8.1*  --  7.9*  --  8.5*  PROT 4.4*  --  4.9*  --   --   --   --   --  5.6*  ALBUMIN 1.9*  --  2.0*  --   --   --   --   --  2.3*  AST 60*  --  154*  --   --   --   --   --  58*  ALT 29  --  93*  --   --   --   --   --  52*  ALKPHOS 55  --  69  --   --   --   --   --  64  BILITOT 0.6  --  0.3  --   --   --   --   --  0.8  GFRNONAA >60   < > >60   < > >60  --  >60  --  >60  GFRAA >60   < > >60   < > >60  --  >60  --  >60  ANIONGAP 20*   < > 8   < > 9  --  9  --  12   < > = values in this interval not displayed.     Hematology Recent Labs  Lab 03/09/20 0300 03/09/20 0300 03/09/20 1536 03/09/20 1603 03/10/20 0518  WBC 10.6*  --   --  10.7* 12.1*  RBC 3.03*  --   --  3.22* 3.38*  HGB 9.9*   < > 11.9* 10.4* 10.8*  HCT 30.5*   < > 35.0* 32.5* 33.8*  MCV 100.7*  --   --  100.9* 100.0  MCH 32.7  --   --  32.3 32.0  MCHC 32.5  --   --  32.0 32.0  RDW 15.7*  --   --  15.8* 15.8*  PLT 141*  --   --  153 163   < > = values in this interval not displayed.    Cardiac EnzymesNo results for input(s): TROPONINI in the last 168 hours. No results for input(s): TROPIPOC in the last 168 hours.   BNP Recent Labs  Lab 03/07/20 0710  BNP 74.6     DDimer No results for input(s): DDIMER in the last 168 hours.   Radiology    DG Chest Port 1 View  Result Date: 03/10/2020 CLINICAL DATA:  Acute respiratory failure EXAM: PORTABLE CHEST 1 VIEW COMPARISON:  Yesterday FINDINGS: Endotracheal tube tip is  halfway between the clavicular heads and carina. Left subclavian line with tip at the SVC. There is no edema, consolidation, effusion, or pneumothorax. Mild streaky density at the bases. Normal heart size and mediastinal contours. IMPRESSION: 1. Unremarkable hardware positioning. 2. Mild atelectasis at the bases. Electronically Signed   By: Monte Fantasia M.D.   On: 03/10/2020 05:21   DG CHEST PORT 1 VIEW  Result Date: 03/09/2020 CLINICAL DATA:  Acute respiratory failure EXAM: PORTABLE CHEST 1 VIEW COMPARISON:  Portable exam 1531 hours compared to 03/09/2020 FINDINGS: Tip of endotracheal tube projects 2.3 cm above carina. LEFT subclavian central venous catheter with tip projecting over SVC. Normal heart size, mediastinal contours, and pulmonary vascularity. Lungs clear. No pulmonary infiltrate, pleural effusion or pneumothorax. Osseous structures unremarkable. IMPRESSION: No acute abnormalities. Electronically Signed   By: Lavonia Dana M.D.   On: 03/09/2020 15:44   DG Chest Port 1 View  Result Date: 03/09/2020 CLINICAL DATA:  Acute respiratory failure EXAM: PORTABLE CHEST 1 VIEW COMPARISON:  March 08, 2020 FINDINGS: The heart size and mediastinal contours are within normal limits. ETT is 2 cm above the carina. A left-sided PICC is seen with the tip at the superior cavoatrial junction. The lungs are clear. No focal airspace consolidation. No acute osseous abnormality. IMPRESSION:  No active disease. Electronically Signed   By: Prudencio Pair M.D.   On: 03/09/2020 05:47   Overnight EEG with video  Result Date: 03/08/2020 Kenneth Havens, MD     03/08/2020  2:30 PM Patient Name: Kenneth Sullivan MRN: [id number] Epilepsy Attending: Lora Sullivan Referring Physician/Provider: Dr. Marshell Garfinkel Duration: 03/07/2020 1321 to 03/08/2020 1307  Patient history: 64 year old male presented after cardiac arrest.  EEG to evaluate for seizures.  Level of alertness: Comatose  AEDs during EEG study: Propofol  Technical  aspects: This EEG study was done with scalp electrodes positioned according to the 10-20 International system of electrode placement. Electrical activity was acquired at a sampling rate of 500Hz  and reviewed with a high frequency filter of 70Hz  and a low frequency filter of 1Hz . EEG data were recorded continuously and digitally stored.  Description: EEG initially showed continuous generalized low amplitude 3-6Hz  theta-delta slowing with overriding 15 to 18 Hz, 2-3 uV beta activity distributed symmetrically and diffusely. Gradually eeg showed predominantly 4-8hz  generalized theta-alpha activity.  Hyperventilation and photic stimulation were not performed.  Abnormality -Continuous slow, generalized -Excessive beta, generalized  IMPRESSION: This study is suggestive of moderate to severe diffuse encephalopathy, nonspecific etiology but could be secondary to sedation.  No seizures or epileptiform discharges were seen throughout the recording. EEG appeared to be improving throughout the study. Priyanka O Yadav   VAS [place] LOWER EXTREMITY VENOUS (DVT)  Result Date: 03/08/2020  Lower Venous DVTStudy Indications: Swelling.  Limitations: Poor ultrasound/tissue interface, bandages, line and arctic sun device. Comparison Study: IR procedure 03-07-20, positive IVC and right iliac vein. Performing Technologist: Baldwin Crown ARDMS, RVT  Examination Guidelines: A complete evaluation includes B-mode imaging, spectral Doppler, color Doppler, and power Doppler as needed of all accessible portions of each vessel. Bilateral testing is considered an integral part of a complete examination. Limited examinations for reoccurring indications may be performed as noted. The reflux portion of the exam is performed with the patient in reverse Trendelenburg.  +---------+---------------+---------+-----------+----------+--------------+ RIGHT    CompressibilityPhasicitySpontaneityPropertiesThrombus Aging  +---------+---------------+---------+-----------+----------+--------------+ CFV      None           No       No                   Acute          +---------+---------------+---------+-----------+----------+--------------+ SFJ      None                                                        +---------+---------------+---------+-----------+----------+--------------+ FV Prox                                               Not visualized +---------+---------------+---------+-----------+----------+--------------+ FV Mid                                                Not visualized +---------+---------------+---------+-----------+----------+--------------+ FV Distal  Not visualized +---------+---------------+---------+-----------+----------+--------------+ PFV                                                   Not visualized +---------+---------------+---------+-----------+----------+--------------+ POP      None           No       No                                  +---------+---------------+---------+-----------+----------+--------------+ PTV      None                                                        +---------+---------------+---------+-----------+----------+--------------+ PERO     None                                                        +---------+---------------+---------+-----------+----------+--------------+   +---------+---------------+---------+-----------+----------+--------------+ LEFT     CompressibilityPhasicitySpontaneityPropertiesThrombus Aging +---------+---------------+---------+-----------+----------+--------------+ CFV      None           No       No                   Acute          +---------+---------------+---------+-----------+----------+--------------+ SFJ                                                   Not visualized  +---------+---------------+---------+-----------+----------+--------------+ FV Prox                                               Not visualized +---------+---------------+---------+-----------+----------+--------------+ FV Mid                                                Not visualized +---------+---------------+---------+-----------+----------+--------------+ FV Distal                                             Not visualized +---------+---------------+---------+-----------+----------+--------------+ PFV                                                   Not visualized +---------+---------------+---------+-----------+----------+--------------+ POP      None           No       No                                  +---------+---------------+---------+-----------+----------+--------------+  PTV      None                                                        +---------+---------------+---------+-----------+----------+--------------+ PERO     None                                                        +---------+---------------+---------+-----------+----------+--------------+ Gastroc  None                                                        +---------+---------------+---------+-----------+----------+--------------+ CIV                                                   Not visualized +---------+---------------+---------+-----------+----------+--------------+ EIV                                                   Not visualized +---------+---------------+---------+-----------+----------+--------------+ Unable to visualize bilateral femoral and iliac veins due to arctic sun device. Poorly visualized bilateral calf veins.    Summary: RIGHT: - Findings consistent with acute deep vein thrombosis involving the right common femoral vein, SF junction, right popliteal vein, right posterior tibial veins, and right peroneal veins. However, portions of this  examination were limited- see technologist  comments above.  LEFT: - Findings consistent with acute deep vein thrombosis involving the left common femoral vein, left popliteal vein, left posterior tibial veins, and left peroneal veins. However, portions of this examination were limited- see technologist comments above.  *See table(s) above for measurements and observations. Electronically signed by Ruta Hinds MD on 03/08/2020 at 4:02:44 PM.    Final     Cardiac Studies   ECHO  1. Very difficult study with poor windows. The RV appears severely  dilated with severely reduced function. D shaped septum. Overall, concerns  for pulmonary embolism in this patient with cardiac arrest. Clinical  correlation is recommended.  2. Left ventricular ejection fraction, by estimation, is 60 to 65%. The  left ventricle has normal function. Left ventricular endocardial border  not optimally defined to evaluate regional wall motion. Left ventricular  diastolic function could not be  evaluated. There is the interventricular septum is flattened in systole  and diastole, consistent with right ventricular pressure and volume  overload.  3. Right ventricular systolic function is severely reduced. The right  ventricular size is severely enlarged.  4. The mitral valve is grossly normal. No evidence of mitral valve  regurgitation. No evidence of mitral stenosis.  5. The aortic valve is grossly normal. Aortic valve regurgitation is not  visualized. No aortic stenosis is present.  6. Right atrial size was mildly dilated.   Patient Profile     64  y.o. male with arrest following PE.  No cardiac arrhythmias.  Echo with NL LV function with RV dysfunction/strain.  Now with GI bleed.  Evidence of gastric ulcer and DVT.    Assessment & Plan    Cardiac arrest:  Secondary to PE.   Decreased urine output.  Positive 11 liters since admission.   BP labile with sedation. Sinus tach also labile with sedation.    No  arrhythmias or acute cardiac issues.  BP is elevated when sedation is lifted.  However, he is not able to be ventilated unless heavily sedated.   Vent wean issue.   Oxygenating well.  We will follow as needed.    For questions or updates, please contact [place] Please consult [email or web address] for contact info under Cardiology/STEMI.   Signed, Minus Breeding, MD  03/10/2020, 8:24 AM

## 2020-03-10 NOTE — Progress Notes (Signed)
ANTICOAGULATION CONSULT NOTE  Pharmacy Consult:  Heparin Indication: pulmonary embolus  No Known Allergies  Patient Measurements: Height: 6\' 2"  (188 cm) Weight: 187 lb 13.3 oz (85.2 kg)(pt with TTM equipment hooked up) IBW/kg (Calculated) : 82.2 Heparin Dosing Weight: 73 kg  Vital Signs: Temp: 99.3 F (37.4 C) (03/26 1200) Temp Source: Bladder (03/26 1200) BP: 162/87 (03/26 0354) Pulse Rate: 96 (03/26 1200)  Labs: Recent Labs    03/08/20 0337 03/08/20 0342 03/09/20 0300 03/09/20 0405 03/09/20 0957 03/09/20 1536 03/09/20 1603 03/09/20 1603 03/10/20 0518 03/10/20 1100 03/10/20 1230  HGB 12.0*   < > 9.9*  --   --    < > 10.4*   < > 10.8* 10.7*  --   HCT 36.1*   < > 30.5*  --   --    < > 32.5*  --  33.8* 33.1*  --   PLT 153   < > 141*   < >  --   --  153  --  163 171  --   LABPROT 14.4  --   --   --   --   --   --   --   --   --   --   INR 1.1  --   --   --   --   --   --   --   --   --   --   HEPARINUNFRC  --    < >  --    < > 0.37  --   --   --  0.56  --  0.42  CREATININE 0.77   < > 0.77  --   --   --   --   --  0.98 0.90  --    < > = values in this interval not displayed.    Estimated Creatinine Clearance: 98.9 mL/min (by C-G formula based on SCr of 0.9 mg/dL).   Assessment: 59 YOM presented with asystolic arrest from bilateral PEs.  He also has right iliac and iliocaval DVTs.  Patient had maroon stools and bloody output from NG upon arrival to ICU. Upper endoscopy on 03/07/20 showed large cratered oozing gastric ulcer. Went to IR for IVC filter placement and attempted mechanical aspiration of PE. Underwent US CFA access for mesenteric angiogram as well on 03/07/20.  Patient has a high risk for bleeding as well as decompensation and repeat cardiac arrest.  Pharmacy consulted to manage IV heparin.  Maroon stools resolved.  Heparin level is therapeutic; no bleeding reported.  Goal of Therapy: Heparin level 0.3-0.5 units/mL   Plan:  Continue heparin gtt at 950  units/hr Daily heparin level and CBC  Mallorie Norrod D. Mina Marble, PharmD, BCPS, [place] 03/10/2020, 1:57 PM

## 2020-03-11 ENCOUNTER — Inpatient Hospital Stay (HOSPITAL_COMMUNITY): Payer: Medicaid Other

## 2020-03-11 DIAGNOSIS — I469 Cardiac arrest, cause unspecified: Secondary | ICD-10-CM

## 2020-03-11 DIAGNOSIS — K254 Chronic or unspecified gastric ulcer with hemorrhage: Secondary | ICD-10-CM | POA: Diagnosis not present

## 2020-03-11 DIAGNOSIS — J9601 Acute respiratory failure with hypoxia: Secondary | ICD-10-CM | POA: Diagnosis not present

## 2020-03-11 DIAGNOSIS — G931 Anoxic brain damage, not elsewhere classified: Secondary | ICD-10-CM | POA: Diagnosis not present

## 2020-03-11 DIAGNOSIS — I2699 Other pulmonary embolism without acute cor pulmonale: Secondary | ICD-10-CM

## 2020-03-11 LAB — GLUCOSE, CAPILLARY
Glucose-Capillary: 111 mg/dL — ABNORMAL HIGH (ref 70–99)
Glucose-Capillary: 137 mg/dL — ABNORMAL HIGH (ref 70–99)
Glucose-Capillary: 126 mg/dL — ABNORMAL HIGH (ref 70–99)
Glucose-Capillary: 131 mg/dL — ABNORMAL HIGH (ref 70–99)
Glucose-Capillary: 130 mg/dL — ABNORMAL HIGH (ref 70–99)

## 2020-03-11 LAB — COMPREHENSIVE METABOLIC PANEL
ALT: 36 U/L (ref 0–44)
AST: 41 U/L (ref 15–41)
Albumin: 2 g/dL — ABNORMAL LOW (ref 3.5–5.0)
Alkaline Phosphatase: 56 U/L (ref 38–126)
Anion gap: 9 (ref 5–15)
BUN: 11 mg/dL (ref 8–23)
CO2: 22 mmol/L (ref 22–32)
Calcium: 8.6 mg/dL — ABNORMAL LOW (ref 8.9–10.3)
Chloride: 104 mmol/L (ref 98–111)
Creatinine, Ser: 0.76 mg/dL (ref 0.61–1.24)
GFR calc Af Amer: >60 mL/min (ref >60)
GFR calc non Af Amer: >60 mL/min (ref >60)
Glucose, Bld: 115 mg/dL — ABNORMAL HIGH (ref 70–99)
Potassium: 4.3 mmol/L (ref 3.5–5.1)
Sodium: 135 mmol/L (ref 135–145)
Total Bilirubin: 0.8 mg/dL (ref 0.3–1.2)
Total Protein: 5.4 g/dL — ABNORMAL LOW (ref 6.5–8.1)

## 2020-03-11 LAB — TRIGLYCERIDES: Triglycerides: 100 mg/dL (ref <150)

## 2020-03-11 LAB — PHOSPHORUS: Phosphorus: 3.6 mg/dL (ref 2.5–4.6)

## 2020-03-11 LAB — CBC
HCT: 31.9 % — ABNORMAL LOW (ref 39.0–52.0)
Hemoglobin: 10.4 g/dL — ABNORMAL LOW (ref 13.0–17.0)
MCH: 32.6 pg (ref 26.0–34.0)
MCHC: 32.6 g/dL (ref 30.0–36.0)
MCV: 100 fL (ref 80.0–100.0)
Platelets: 160 10*3/uL (ref 150–400)
RBC: 3.19 MIL/uL — ABNORMAL LOW (ref 4.22–5.81)
RDW: 15.5 % (ref 11.5–15.5)
WBC: 6.8 10*3/uL (ref 4.0–10.5)
nRBC: 0 % (ref 0.0–0.2)

## 2020-03-11 LAB — HEPARIN LEVEL (UNFRACTIONATED)
Heparin Unfractionated: 0.18 IU/mL — ABNORMAL LOW (ref 0.30–0.70)
Heparin Unfractionated: 0.2 IU/mL — ABNORMAL LOW (ref 0.30–0.70)
Heparin Unfractionated: 0.18 IU/mL — ABNORMAL LOW (ref 0.30–0.70)

## 2020-03-11 LAB — MAGNESIUM: Magnesium: 2 mg/dL (ref 1.7–2.4)

## 2020-03-11 MED ORDER — DEXMEDETOMIDINE HCL IN NACL 400 MCG/100ML IV SOLN: 0.4000 ug/kg/h | INTRAVENOUS | Status: DC

## 2020-03-11 MED ORDER — PROPOFOL 1000 MG/100ML IV EMUL
5.0000 ug/kg/min | INTRAVENOUS | Status: DC
  Administered 2020-03-11: 40 ug/kg/min via INTRAVENOUS
  Administered 2020-03-11: 25 ug/kg/min via INTRAVENOUS
  Administered 2020-03-12 – 2020-03-13 (×6): 40 ug/kg/min via INTRAVENOUS
  Administered 2020-03-13: 20 ug/kg/min via INTRAVENOUS
  Administered 2020-03-13: 30 ug/kg/min via INTRAVENOUS
  Administered 2020-03-13: 40 ug/kg/min via INTRAVENOUS
  Administered 2020-03-13: 15 ug/kg/min via INTRAVENOUS
  Administered 2020-03-14 (×3): 30 ug/kg/min via INTRAVENOUS
  Administered 2020-03-15: 25 ug/kg/min via INTRAVENOUS
  Administered 2020-03-15: 22:00:00 35 ug/kg/min via INTRAVENOUS
  Filled 2020-03-11 (×21): qty 100

## 2020-03-11 MED ORDER — TRAVASOL 10 % IV SOLN
INTRAVENOUS | Status: AC
  Filled 2020-03-11: qty 1199.52

## 2020-03-11 MED ORDER — ACETAMINOPHEN 325 MG PO TABS: 650.0000 mg | ORAL_TABLET | Freq: Four times a day (QID) | ORAL | Status: DC | PRN

## 2020-03-11 NOTE — Progress Notes (Signed)
ANTICOAGULATION CONSULT NOTE  Pharmacy Consult:  Heparin Indication: pulmonary embolus  No Known Allergies  Patient Measurements: Height: 6\' 2"  (188 cm) Weight: 184 lb 1.4 oz (83.5 kg) IBW/kg (Calculated) : 82.2 Heparin Dosing Weight: 73 kg  Vital Signs: Temp: 98.6 F (37 C) (03/27 0200) Temp Source: Bladder (03/27 0200) BP: 113/63 (03/27 0000) Pulse Rate: 126 (03/27 0300)  Labs: Recent Labs    03/09/20 0300 03/09/20 0405 03/10/20 0518 03/10/20 0518 03/10/20 1100 03/10/20 1230 03/11/20 0422  HGB 9.9*   < > 10.8*   < > 10.7*  --  10.4*  HCT 30.5*   < > 33.8*  --  33.1*  --  31.9*  PLT 141*   < > 163  --  171  --  160  HEPARINUNFRC  --    < > 0.56  --   --  0.42 0.18*  CREATININE 0.77  --  0.98  --  0.90  --   --    < > = values in this interval not displayed.    Estimated Creatinine Clearance: 98.9 mL/min (by C-G formula based on SCr of 0.9 mg/dL).   Assessment: 6 YOM presented with asystolic arrest from bilateral PEs.  He also has right iliac and iliocaval DVTs.  Patient had maroon stools and bloody output from NG upon arrival to ICU. Upper endoscopy on 03/07/20 showed large cratered oozing gastric ulcer. Went to IR for IVC filter placement and attempted mechanical aspiration of PE. Underwent US CFA access for mesenteric angiogram as well on 03/07/20.  Patient has a high risk for bleeding as well as decompensation and repeat cardiac arrest.  Pharmacy consulted to manage IV heparin.  Maroon stools resolved.  3/27 AM update:  Heparin level low this AM Hgb 10.4  Goal of Therapy: Heparin level 0.3-0.5 units/mL   Plan:  -Inc heparin to 1050 units/hr -1300 heparin level  Narda Bonds, PharmD, BCPS Clinical Pharmacist Phone: [phone]

## 2020-03-11 NOTE — Progress Notes (Addendum)
NAME:  Kenneth Sullivan, MRN:  [id number], DOB:  Nov 07, 1956, LOS: 4 ADMISSION DATE:  03/07/2020, CONSULTATION DATE:  03/07/20 REFERRING MD:  Dr Roxanne Mins MD, CHIEF COMPLAINT:  Cardiac arrest  Brief History   64 year old with no significant past medical history.  Had acute shortness of breath with sats in the 40s on EMS arrival.  He had a witnessed bradycardic, asystolic arrest when EMS was present with 3 rounds of epi, 20 minutes to ROSC. Underwent normothermia protocol.  Work-up significant for massive PE, DVT with RV strain, GI bleed from gastric ulcer.  Past Medical History  Has not seen a doctor.  No past medical history  Smokes 1 pack/day, drinks 2 to 3 40 ounce beers every day.  Smokes marijuana.  Denies any other recreational drug use.  Significant Hospital Events   3/23-Admit, underwent EGD with findings of oozing gastric ulcer with clot, IR for mechanical thrombectomy, IVC filter and GDA embolization 3/24- Started heparin drip  Consults:  Cardiology, GI, interventional radiology  Procedures:    Significant Diagnostic Tests:  Chest x-ray 3/23-ET tube in stable portion, no acute cardiopulmonary abnormality. CTA 3/23-bilateral PE with RV/with ratio 1.9, emphysema CT head 3/23-chronic microvascular changes.  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 Lower extremity duplex  Micro Data:  Bcx 3/23-negative [name] 3/23-negative  Antimicrobials:   Interim history/subjective:  On 40 mcg propofol this morning. On low dose norepinephrine 6 mcg this morning.   Objective   Blood pressure (!) 99/54, pulse 95, temperature 99.3 F (37.4 C), resp. rate 15, height 6\' 2"  (1.88 m), weight 83.5 kg, SpO2 100 %.    Vent Mode: PRVC FiO2 (%):  [40 %-50 %] 50 % Set Rate:  [15 bmp] 15 bmp Vt Set:  [650 mL] 650 mL PEEP:  [5 cmH20] 5 cmH20 Plateau Pressure:  [19 cmH20-27 cmH20] 21 cmH20   Intake/Output Summary (Last 24 hours) at  03/11/2020 1057 Last data filed at 03/11/2020 1000 Gross per 24 hour  Intake 2811.54 ml  Output 530 ml  Net 2281.54 ml   Filed Weights   03/09/20 0339 03/10/20 0500 03/11/20 0303  Weight: 80.2 kg 85.2 kg 83.5 kg    Examination: Gen:   Chronically and critically ill appearing male, appears older than stated age. Intubated, sedated.  HEENT:  NCAT. ETT secure. Anicteric sclera.  Neck:    No masses; no thyromegaly, ETT Lungs:   Clear to auscultation bilaterally; normal respiratory effort CV:    Regular rate and rhythm; no murmurs Abd:  + bowel sounds; soft, non-tender; no palpable masses, no distension Ext:    RLE edema > LLE edema. BUE 1+ edema. No obvious joint deformity  Neuro: Sedated at time of exam. Does not follow commands. Pupils are sluggish. Patient does not blink to threat. Questionable extensor posturing with painful stimulation seen in BUE   Labs and imaging personally reviewed: Chest xray 3/27Stable ETT and CVC.  Hgb stable. Glucose <180, Cr 0.76  Resolved Hospital Problem list    Hypoglycemia Transaminitis  Assessment & Plan:   Cardiac arrest with asystole secondary to massive  PE -downtime 20 minutes -initial CT H without acute abnormality P -Normothermia -Weaning sedation for better assessment of neurologic status. Will turn off propofol today. Consider transition to precedex today. May need MRI today if no improvement.   Acute respiratory failure secondary to massive pulmonary embolism with acute cor pulmonale -Underwent mechanical thrombectomy and IVC filter- yield on the thrombectomy  was minimal as clot was adherent and he likely has subacute clot -Not a candidate for ECMO P -Continue heparin, appreciate pharmacist assistance -Monitor for s/sx bleeding -Continue MV support  -VAP bundle   Shock -circulatory shock in setting of cardiac arrest, possible component of obstructive in setting of PE now s/p thrombectomy -suspect adequate volume resuscitation  at this time P -NE for MAP goal > 65  -Continue Heparin for PE as above  - he is 13L positive since admission - in the setting of RV failure he will require diuresis.   Acute GI bleed, gastric ulcer Not amenable for endoscopic intervention Underwent GDA embolization.  No evidence of recurrent bleed though hemoglobin is lower today P -Close monitoring of CBC, especially in setting of heparin gtt -If bleeds, may need consideration of gastrectomy  -Continue PPI drip. -GI rec to pursue TPN and avoid gastric tubes x 2 weeks   Lower extremity DVT S/p IVC filter.   P -Can be removed in 3 months if he survives this admission.   Goals of Care -PCCM has discussed in detail with next of kin--niece [name] over telephone and she understands that he remains critically ill with guarded prognosis.  -Greatest outstanding question may be neurologic status at this time--weaning sedation 3/26   Best practice:  Diet: TPN  Pain/Anxiety/Delirium protocol (if indicated): Propofol, fentanyl as needed.  RASS goal -1 - VAP protocol (if indicated): Ordered DVT prophylaxis: Heparin gtt  GI prophylaxis: Pepcid Glucose control: SSI Mobility: Bed Code Status: Full Family Communication: I spoke with [name] over the phone today 3/27.  She seemed very surprised at my description that her uncle is very sick and critically ill and may not survive this hospital stay. Although I see that previous providers have updated her accordingly. Will update with MRI results.  Disposition: ICU   CRITICAL CARE The patient is critically ill with multiple organ systems failure and requires high complexity decision making for assessment and support, frequent evaluation and titration of therapies, application of advanced monitoring technologies and extensive interpretation of multiple databases.   Critical Care Time devoted to patient care services described in this note is 34 minutes. This time reflects time of care of this  [place] . This critical care time does not reflect separately billable procedures or procedure time, teaching time or supervisory time of PA/NP/Med student/Med Resident etc but could involve care discussion time.  [facility] 03/11/2020 10:57 AM  Pager: [phone] After hours pager: [phone]

## 2020-03-11 NOTE — Progress Notes (Signed)
ANTICOAGULATION CONSULT NOTE  Pharmacy Consult:  Heparin Indication: pulmonary embolus  No Known Allergies  Patient Measurements: Height: 6\' 2"  (188 cm) Weight: 184 lb 1.4 oz (83.5 kg) IBW/kg (Calculated) : 82.2 Heparin Dosing Weight: 73 kg  Vital Signs: Temp: 101.5 F (38.6 C) (03/27 1953) Temp Source: Bladder (03/27 1800) Pulse Rate: 96 (03/27 2023)  Labs: Recent Labs    03/10/20 0518 03/10/20 0518 03/10/20 1100 03/10/20 1230 03/11/20 0422 03/11/20 1330 03/11/20 2003  HGB 10.8*   < > 10.7*  --  10.4*  --   --   HCT 33.8*  --  33.1*  --  31.9*  --   --   PLT 163  --  171  --  160  --   --   HEPARINUNFRC 0.56  --   --    < > 0.18* 0.20* 0.18*  CREATININE 0.98  --  0.90  --  0.76  --   --    < > = values in this interval not displayed.    Estimated Creatinine Clearance: 111.3 mL/min (by C-G formula based on SCr of 0.76 mg/dL).   Assessment: 25 YOM presented with asystolic arrest from bilateral PEs.  He also has right iliac and iliocaval DVTs.  Patient had maroon stools and bloody output from NG upon arrival to ICU. Upper endoscopy on 03/07/20 showed large cratered oozing gastric ulcer. Went to IR for IVC filter placement and attempted mechanical aspiration of PE. Underwent US CFA access for mesenteric angiogram as well on 03/07/20.  Patient has a high risk for bleeding as well as decompensation and repeat cardiac arrest.  Pharmacy consulted to manage IV heparin.  Maroon stools resolved. -heparin level= 0.18 after increase to 1150 units/hr    Goal of Therapy: Heparin level 0.3-0.5 units/mL   Plan:  -increase heparin to 1300 units/hr -Heparin level in 6 hours and daily wth CBC daily  Hildred Laser, PharmD Clinical Pharmacist **Pharmacist phone directory can now be found on [email or web address] ([email or web address]).  Listed under [facility].

## 2020-03-11 NOTE — Progress Notes (Signed)
ANTICOAGULATION CONSULT NOTE  Pharmacy Consult:  Heparin Indication: pulmonary embolus  No Known Allergies  Patient Measurements: Height: 6\' 2"  (188 cm) Weight: 184 lb 1.4 oz (83.5 kg) IBW/kg (Calculated) : 82.2 Heparin Dosing Weight: 73 kg  Vital Signs: Temp: 99.5 F (37.5 C) (03/27 1330) BP: 99/54 (03/27 0748) Pulse Rate: 114 (03/27 1330)  Labs: Recent Labs    03/10/20 0518 03/10/20 0518 03/10/20 1100 03/10/20 1230 03/11/20 0422 03/11/20 1330  HGB 10.8*   < > 10.7*  --  10.4*  --   HCT 33.8*  --  33.1*  --  31.9*  --   PLT 163  --  171  --  160  --   HEPARINUNFRC 0.56   < >  --  0.42 0.18* 0.20*  CREATININE 0.98  --  0.90  --  0.76  --    < > = values in this interval not displayed.    Estimated Creatinine Clearance: 111.3 mL/min (by C-G formula based on SCr of 0.76 mg/dL).   Assessment: 5 YOM presented with asystolic arrest from bilateral PEs.  He also has right iliac and iliocaval DVTs.  Patient had maroon stools and bloody output from NG upon arrival to ICU. Upper endoscopy on 03/07/20 showed large cratered oozing gastric ulcer. Went to IR for IVC filter placement and attempted mechanical aspiration of PE. Underwent US CFA access for mesenteric angiogram as well on 03/07/20.  Patient has a high risk for bleeding as well as decompensation and repeat cardiac arrest.  Pharmacy consulted to manage IV heparin.  Maroon stools resolved.  3/27 PM update:  Heparin level low again this afternoon at 0.20 Nurse confirms that the infusion was not interrupted and no signs of bleeding noted.  Goal of Therapy: Heparin level 0.3-0.5 units/mL   Plan:  -Inc heparin to 1150 units/hr -2030 heparin level  Nicoletta Dress, PharmD PGY2 Infectious Disease Pharmacy Resident

## 2020-03-11 NOTE — Progress Notes (Signed)
PHARMACY - TOTAL PARENTERAL NUTRITION CONSULT NOTE   Indication: Gastric ulcer, GIB. GI is reccomending no feeds/no NGT for 2 weeks  Patient Measurements: Height: 6\' 2"  (188 cm) Weight: 184 lb 1.4 oz (83.5 kg) IBW/kg (Calculated) : 82.2   Body mass index is 23.64 kg/m.  Assessment: 64 yo with cardiac arrest on arrival to hospital; Upper GI bleed from large distal gastric ulcer s/p embolization without further bleeding. On IV Heparin for bilateral PEs - s/p thrombectomy and IVC filter placement. Avoid any gastric tubes for 2 weeks from admit and avoid enteral feeding during this time as well.  Glucose / Insulin: BS 67 to 115, no history of diabetes Electrolytes: K 4.3, Phos 3.6, Mag 2 Renal: Scr 0.76, BUN 11 LFTs / TGs: LFTs 41/36, Tbili WNL, TGs 109 Prealbumin / albumin: Prealbumin 6.7, Alb 2 Intake / Output; MIVF: UOP 0.3 ml/kg/hr; D5LR @ 75 ml/hr + multiple drips. Propofol running at 40 mcg/kg/min GI Imaging: None since TPN started Surgeries / Procedures: None since TPN started  Central access: CVC Triple Lumen 3/23 TPN start date: 3/26  Nutritional Goals (per Rd recommendations on 3/26): kCal: 2247 kcal/day, Protein: 110 - 135 gm/day, Fluid: >2 L/day  Goal TPN rate is 105 mL/hr (provides 120 g of protein and 2300 kcals per day)  Current Nutrition:  NPO  Propofol @ 40 mcg/kg/min = 538 kcals/day  Plan:  Increase TPN to 105 mL/hr at 1800 Will hold the addition of any lipid emulsion in the TPN for first 7 days for critically ill patients per ASPEN guidelines (Start date 4/2) Electrolytes in TPN: 24mEq/L of Na, 10mEq/L of K, 73mEq/L of Ca, 38mEq/L of Mg, and 74mmol/L of Phos. Cl:Ac ratio 1:2 Add standard MVI on MWF due to national shortage and trace elements to TPN Initiate Sensitive q6h SSI and adjust as needed  D/c MIVF Monitor TPN labs daily with initiation and on Mon/Thurs  Alanda Slim, PharmD, [facility] Clinical Pharmacist Please see AMION for all Pharmacists' Contact Phone  Numbers 03/11/2020, 7:05 AM

## 2020-03-12 ENCOUNTER — Inpatient Hospital Stay (HOSPITAL_COMMUNITY): Payer: Medicaid Other

## 2020-03-12 DIAGNOSIS — I63429 Cerebral infarction due to embolism of unspecified anterior cerebral artery: Secondary | ICD-10-CM | POA: Diagnosis not present

## 2020-03-12 DIAGNOSIS — I639 Cerebral infarction, unspecified: Secondary | ICD-10-CM

## 2020-03-12 DIAGNOSIS — J9601 Acute respiratory failure with hypoxia: Secondary | ICD-10-CM | POA: Diagnosis not present

## 2020-03-12 DIAGNOSIS — I469 Cardiac arrest, cause unspecified: Secondary | ICD-10-CM | POA: Diagnosis not present

## 2020-03-12 DIAGNOSIS — I6340 Cerebral infarction due to embolism of unspecified cerebral artery: Secondary | ICD-10-CM | POA: Insufficient documentation

## 2020-03-12 DIAGNOSIS — G931 Anoxic brain damage, not elsewhere classified: Secondary | ICD-10-CM | POA: Diagnosis not present

## 2020-03-12 LAB — CULTURE, BLOOD (ROUTINE X 2)
Culture: NO GROWTH
Culture: NO GROWTH
Special Requests: ADEQUATE
Special Requests: ADEQUATE

## 2020-03-12 LAB — BASIC METABOLIC PANEL
Anion gap: 7 (ref 5–15)
BUN: 11 mg/dL (ref 8–23)
CO2: 25 mmol/L (ref 22–32)
Calcium: 8.4 mg/dL — ABNORMAL LOW (ref 8.9–10.3)
Chloride: 106 mmol/L (ref 98–111)
Creatinine, Ser: 0.62 mg/dL (ref 0.61–1.24)
GFR calc Af Amer: >60 mL/min (ref >60)
GFR calc non Af Amer: >60 mL/min (ref >60)
Glucose, Bld: 151 mg/dL — ABNORMAL HIGH (ref 70–99)
Potassium: 4.2 mmol/L (ref 3.5–5.1)
Sodium: 138 mmol/L (ref 135–145)

## 2020-03-12 LAB — GLUCOSE, CAPILLARY
Glucose-Capillary: 123 mg/dL — ABNORMAL HIGH (ref 70–99)
Glucose-Capillary: 127 mg/dL — ABNORMAL HIGH (ref 70–99)
Glucose-Capillary: 128 mg/dL — ABNORMAL HIGH (ref 70–99)
Glucose-Capillary: 135 mg/dL — ABNORMAL HIGH (ref 70–99)
Glucose-Capillary: 146 mg/dL — ABNORMAL HIGH (ref 70–99)
Glucose-Capillary: 149 mg/dL — ABNORMAL HIGH (ref 70–99)
Glucose-Capillary: 170 mg/dL — ABNORMAL HIGH (ref 70–99)

## 2020-03-12 LAB — PHOSPHORUS: Phosphorus: 3.2 mg/dL (ref 2.5–4.6)

## 2020-03-12 LAB — CBC
HCT: 27.8 % — ABNORMAL LOW (ref 39.0–52.0)
Hemoglobin: 9.1 g/dL — ABNORMAL LOW (ref 13.0–17.0)
MCH: 32.3 pg (ref 26.0–34.0)
MCHC: 32.7 g/dL (ref 30.0–36.0)
MCV: 98.6 fL (ref 80.0–100.0)
Platelets: 188 10*3/uL (ref 150–400)
RBC: 2.82 MIL/uL — ABNORMAL LOW (ref 4.22–5.81)
RDW: 15.9 % — ABNORMAL HIGH (ref 11.5–15.5)
WBC: 10.5 10*3/uL (ref 4.0–10.5)
nRBC: 0 % (ref 0.0–0.2)

## 2020-03-12 LAB — HEPARIN LEVEL (UNFRACTIONATED): Heparin Unfractionated: 0.17 IU/mL — ABNORMAL LOW (ref 0.30–0.70)

## 2020-03-12 LAB — TRIGLYCERIDES: Triglycerides: 78 mg/dL (ref <150)

## 2020-03-12 LAB — MAGNESIUM: Magnesium: 2.1 mg/dL (ref 1.7–2.4)

## 2020-03-12 MED ORDER — FUROSEMIDE 10 MG/ML IJ SOLN
40.0000 mg | Freq: Every day | INTRAMUSCULAR | Status: DC
  Administered 2020-03-12 – 2020-03-20 (×9): 40 mg via INTRAVENOUS
  Filled 2020-03-12 (×9): qty 4

## 2020-03-12 MED ORDER — TRAVASOL 10 % IV SOLN
INTRAVENOUS | Status: AC
  Filled 2020-03-12: qty 1199.52

## 2020-03-12 NOTE — Progress Notes (Signed)
PHARMACY - TOTAL PARENTERAL NUTRITION CONSULT NOTE   Indication: Gastric ulcer, GIB. GI is reccomending no feeds/no NGT for 2 weeks  Patient Measurements: Height: 6\' 2"  (188 cm) Weight: 186 lb 15.2 oz (84.8 kg) IBW/kg (Calculated) : 82.2   Body mass index is 24 kg/m.  Assessment: 64 yo with cardiac arrest on arrival to hospital; Upper GI bleed from large distal gastric ulcer s/p embolization without further bleeding. On IV Heparin for bilateral PEs - s/p thrombectomy and IVC filter placement. Avoid any gastric tubes for 2 weeks from admit and avoid enteral feeding during this time as well.  Glucose / Insulin: BS 126-151, no history of diabetes Electrolytes: K 4.2, Phos 3.2, Mag 2.1 Renal: Scr 0.62, BUN 11 LFTs / TGs: LFTs 41/36, Tbili WNL, TGs 109 Prealbumin / albumin: Prealbumin 6.7, Alb 2 Intake / Output; MIVF: UOP 0.8 ml/kg/hr; multiple drips. Propofol running at 40 mcg/kg/min GI Imaging: None since TPN started Surgeries / Procedures: None since TPN started  Central access: CVC Triple Lumen 3/23 TPN start date: 3/26  Nutritional Goals (per Rd recommendations on 3/26): kCal: 2247 kcal/day, Protein: 110 - 135 gm/day, Fluid: >2 L/day  Goal TPN rate is 105 mL/hr (provides 120 g of protein and 2300 kcals per day)  Current Nutrition:  NPO  Propofol @ 40 mcg/kg/min = 538 kcals/day  Plan:  Continue TPN at 105 mL/hr - hang time 1800  TPN at 105 mL/hr currently provides 120 g of protein and 1860 kcals per day + propofol calories of 538 - this is meeting 100% of patient needs at this time Will hold the addition of any lipid emulsion in the TPN for first 7 days for critically ill patients per ASPEN guidelines (Start date 4/2) Electrolytes in TPN: 54mEq/L of Na, 38mEq/L of K, 35mEq/L of Ca, 19mEq/L of Mg, and 21mmol/L of Phos. Cl:Ac ratio 1:2 Add standard MVI on MWF due to national shortage and trace elements to TPN Initiate Sensitive q6h SSI and adjust as needed  Monitor TPN labs daily  with initiation and on Mon/Thurs  Alanda Slim, PharmD, [facility] Clinical Pharmacist Please see AMION for all Pharmacists' Contact Phone Numbers 03/12/2020, 7:07 AM

## 2020-03-12 NOTE — Progress Notes (Signed)
STROKE TEAM PROGRESS NOTE   HISTORY OF PRESENT ILLNESS (per record) Kenneth Sullivan is a 64 y.o. male past medical history of alcohol abuse and tobacco abuse admitted on 3/23 after cardiac arrest and massive PE associated with RV strain.  Approximate downtime 20 minutes to ROSC.  TTM to 36 degrees.  Patient on heparin drip, temporarily stopped but then resumed. Patient remained encephalopathic and therefore MRI brain was obtained.  MRI resulted on 3/28 showed multifocal acute to subacute infarcts involving bilateral cerebral and cerebellar hemisphere with associated hemorrhagic transformation in the right parietal lobe and other multiple petechial hemorrhages.  Radiology report also felt sequelae of PRES to be consideration. Date last known well: 3/23 tPA Given: no, outside window Baseline MRS 0 Intracerebral Hemorrhage (ICH) Score  Glascow Coma Score  3-4  2  Age >/= 80 yes no 0  ICH volume >/= 9ml  no 0  IVH yes no 0  Infratentorial origin no 0 Total:  2   INTERVAL HISTORY No major changes.   MRI Brain reviewed - Bilateral cerebellar, temporal, frontoparietal, and left thalamic acute infarcts with hemorrhagic conversion in the bilateral cerebellar areas.  Heparin gtt has been stopped.  He has an IVC filter.    EGD showed bleeding gastric ulcer.    Levophed gtt 15 mcg/min  Propofol gtt 10 mcg/kg/min      OBJECTIVE Vitals:   03/12/20 0700 03/12/20 0730 03/12/20 0803 03/12/20 0822  BP:      Pulse: 76  88   Resp: (!) 9 (!) 0 15   Temp: (!) 97.3 F (36.3 C) (!) 96.8 F (36 C)  (!) 97 F (36.1 C)  TempSrc:    Core  SpO2: 100%  100%   Weight:      Height:        CBC:  Recent Labs  Lab 03/07/20 0710 03/07/20 1144 03/10/20 0518 03/10/20 1100 03/11/20 0422 03/12/20 0432  WBC 7.1   < > 12.1*   < > 6.8 10.5  NEUTROABS 3.3  --  9.8*  --   --   --   HGB 12.0*   < > 10.8*   < > 10.4* 9.1*  HCT 40.9   < > 33.8*   < > 31.9* 27.8*  MCV 111.7*   < > 100.0   < > 100.0  98.6  PLT 100*   < > 163   < > 160 188   < > = values in this interval not displayed.    Basic Metabolic Panel:  Recent Labs  Lab 03/11/20 0422 03/12/20 0432  NA 135 138  K 4.3 4.2  CL 104 106  CO2 22 25  GLUCOSE 115* 151*  BUN 11 11  CREATININE 0.76 0.62  CALCIUM 8.6* 8.4*  MG 2.0 2.1  PHOS 3.6 3.2    Lipid Panel:     Component Value Date/Time   TRIG 78 03/12/2020 0432   HgbA1c:  Lab Results  Component Value Date   HGBA1C 5.2 03/07/2020   Urine Drug Screen:     Component Value Date/Time   LABOPIA NONE DETECTED 03/07/2020 0847   COCAINSCRNUR NONE DETECTED 03/07/2020 0847   LABBENZ NONE DETECTED 03/07/2020 0847   AMPHETMU NONE DETECTED 03/07/2020 0847   THCU NONE DETECTED 03/07/2020 0847   LABBARB NONE DETECTED 03/07/2020 0847    Alcohol Level     Component Value Date/Time   ETH <10 03/07/2020 1243    IMAGING   MR BRAIN WO CONTRAST  Result Date:  03/12/2020 CLINICAL DATA:  Initial evaluation for anoxic brain injury. History of asystolic arrest. EXAM: MRI HEAD WITHOUT CONTRAST TECHNIQUE: Multiplanar, multiecho pulse sequences of the brain and surrounding structures were obtained without intravenous contrast. COMPARISON:  Prior head CT from 03/07/2020. FINDINGS: Brain: Generalized age-related cerebral atrophy. Multiple and extensive scattered foci of restricted diffusion seen involving the bilateral cerebral hemispheres, right slightly worse than left, and most pronounced posteriorly within the parieto-occipital regions. Infarcts are largely watershed in distribution. Patchy involvement of the left greater than right basal ganglia and left thalamus. Additional scattered ischemic changes within the cerebellar hemispheres bilaterally. Relative sparing of the brainstem. Findings felt to be most consistent with a global hypoperfusion insult related to recent cardiac arrest. Multiple scattered foci of susceptibility artifacts seen associated with several of these areas of  ischemia, consistent with petechial hemorrhage. Additionally, there is hemorrhagic transformation with and intraparenchymal hematoma seen at the right parietal lobe measuring 3.6 x 3.0 x 5.3 cm (series 16, image 37). Associated internal fluid-fluid levels with probable layering hematocrit. Additional parenchymal hematoma seen inferiorly at the left occipital pole measuring 11 mm (series 16, image 26). Mild localized edema about these hemorrhages without significant regional mass effect. No mass lesion or midline shift. No hydrocephalus or extra-axial fluid collection. Pituitary gland suprasellar region normal. Midline structures intact. Vascular: Major intracranial vascular flow voids are maintained. No findings to suggest dural sinus thrombosis. Skull and upper cervical spine: Craniocervical junction within normal limits. Bone marrow signal intensity normal. No scalp soft tissue abnormality. Sinuses/Orbits: Globes and orbital soft tissues within normal limits. Paranasal sinuses are clear. Fluid seen layering within the nasopharynx. Patient likely intubated. Trace bilateral mastoid effusions. Other: None. IMPRESSION: 1. Multifocal acute to early subacute ischemic infarcts involving the bilateral cerebral and cerebellar hemispheres as above. Findings felt to be most consistent with a global hypoperfusion event related to recent cardiac arrest. Sequelae of PRES would be the primary differential consideration. 2. Associated scattered petechial hemorrhage about many of these areas of ischemia. Evidence for hemorrhagic transformation at the right parietal lobe with 3.6 x 3.0 x 5.3 cm cortically based intraparenchymal hematoma. Additional smaller 11 mm hematoma at the left occipital pole. Associated mild localized edema without significant regional mass effect or midline shift. 3. Generalized age-related cerebral atrophy. Critical Value/emergent results were called by telephone at the time of interpretation on 03/12/2020  at 12:53 am to provider Dr. Lucile Shutters, who verbally acknowledged these results. Electronically Signed   By: Jeannine Boga M.D.   On: 03/12/2020 00:45   DG CHEST PORT 1 VIEW  Result Date: 03/11/2020 CLINICAL DATA:  Endotracheal tube placement. EXAM: PORTABLE CHEST 1 VIEW COMPARISON:  March 10, 2020 4:53 a.m. FINDINGS: Endotracheal tube is identified distal tip 4.3 cm from carina. Left central venous line is identified distal tip in the superior vena cava. The lungs are clear. There is no pleural line to suggest pneumothorax. The mediastinal contour and cardiac silhouette are normal. The bony structures are stable. IMPRESSION: Endotracheal tube distal tip 4.3 cm from carina. Left central venous line distal tip in the superior vena cava. There is no pneumothorax. Electronically Signed   By: Abelardo Diesel M.D.   On: 03/11/2020 08:57   DG Abd Portable 1V  Result Date: 03/11/2020 CLINICAL DATA:  Pre MRI are valuation for foreign body. EXAM: PORTABLE ABDOMEN - 1 VIEW COMPARISON:  None FINDINGS: IVC filter is in place. Vascular coils in place in the upper abdomen. Bowel stent in the rectum. No visible bullet fragments  or other worrisome metallic foreign bodies in the abdomen. Bowel gas pattern is normal. No bone abnormality. IMPRESSION: No metallic foreign bodies that would preclude the performance of MRI. Electronically Signed   By: Lorriane Shire M.D.   On: 03/11/2020 18:15     Transthoracic Echocardiogram  03/07/20 IMPRESSIONS  1. Very difficult study with poor windows. The RV appears severely  dilated with severely reduced function. D shaped septum. Overall, concerns  for pulmonary embolism in this patient with cardiac arrest. Clinical  correlation is recommended.  2. Left ventricular ejection fraction, by estimation, is 60 to 65%. The  left ventricle has normal function. Left ventricular endocardial border  not optimally defined to evaluate regional wall motion. Left ventricular  diastolic  function could not be  evaluated. There is the interventricular septum is flattened in systole  and diastole, consistent with right ventricular pressure and volume  overload.  3. Right ventricular systolic function is severely reduced. The right  ventricular size is severely enlarged.  4. The mitral valve is grossly normal. No evidence of mitral valve  regurgitation. No evidence of mitral stenosis.  5. The aortic valve is grossly normal. Aortic valve regurgitation is not  visualized. No aortic stenosis is present.  6. Right atrial size was mildly dilated.   Bilateral Carotid Dopplers - will order   ECG - SR rate 99 BPM. (See cardiology reading for complete details)   Overnight EEG  03/08/20 IMPRESSION: This study issuggestive of moderate to severe diffuse encephalopathy, nonspecific etiology but could be secondary to sedation.No seizures or epileptiform discharges were seen throughout the recording. EEG appeared to be improving throughout the study.    PHYSICAL EXAM Blood pressure (!) 99/54, pulse 88, temperature (!) 97 F (36.1 C), temperature source Core, resp. rate 15, height 6\' 2"  (1.88 m), weight 84.8 kg, SpO2 100 %.   Intubated Propofol gtt stopped about 5 minutes before exam. Unresponsive with eyes closed. Will not open eyes to verbal or tactile stimuli. Pupils are mid-sized and sluggishly reactive. Positive corneal reflexes. Positive oculocephalic reflex. Positive gag and cough reflex. No withdrawal to pain in all 4 extremities.              ASSESSMENT/PLAN [name] is a 64 y.o. male with history of  alcohol abuse and tobacco abuse admitted on 3/23 after cardiac arrest and massive PE - approximately 20 minutes. 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 Sequelae of PRES would be  the primary differential consideration.  Resultant  coma  Code Stroke CT Head - not ordered  CT head - not ordered  MRI head - Multifocal acute to early subacute ischemic infarcts involving the bilateral cerebral and cerebellar hemispheres as above. Findings felt to be most consistent with a global hypoperfusion event related to recent cardiac arrest. Sequelae of PRES would be the primary differential consideration. Associated scattered petechial hemorrhage about many of these areas of ischemia. Evidence for hemorrhagic transformation at the right parietal lobe with 3.6 x 3.0 x 5.3 cm cortically based intraparenchymal hematoma. Additional smaller 11 mm hematoma at the left occipital pole. Associated mild localized edema without significant regional mass effect or midline shift. Generalized age-related cerebral atrophy.   MRA head - not ordered  CTA H&N - not ordered  CT Perfusion - not ordered  EEG - This study issuggestive of moderate to  severe diffuse encephalopathy, nonspecific etiology but could be secondary to sedation.No seizures or epileptiform discharges were seen throughout the recording. EEG appeared to be improving throughout the study.   Carotid Doppler - pending  2D Echo - EF 60 - 65%. No cardiac source of emboli identified.   [employer] Virus 2 - negative  LDL - pending  HgbA1c - 5.2  UDS - negative  VTE prophylaxis - SCDs Diet  Diet Order            Diet NPO time specified  Diet effective now              No antithrombotic prior to admission, now on No antithrombotic  Patient will counseled to be compliant with his antithrombotic medications  Ongoing aggressive stroke risk factor management  Therapy recommendations:  pending  Disposition:  Pending  Hypertension  Home BP meds: none   Current BP meds: Levophed  BP low . Permissive hypertension (OK if < 220/120) but gradually normalize in 5-7 days  . Long-term BP goal  normotensive  Hyperlipidemia  Home Lipid lowering medication: none   LDL pending, goal < 70  Current lipid lowering medication: none   Continue statin at discharge  Other Stroke Risk Factors  Advanced age  Cigarette smoker - advised to stop smoking  Other Active Problems  Code status - Full code  Anemia - Hb - 9.1  Hospital day # 5  Impression:  Pulmonary embolism leading to cardiac arrest which led to cardioembolic infarcts extensively and associated anoxic brain injury.    His neurological exam shows intact brainstem function, but he has had extensive cortical injury.  Prognosis for meaningful recovery is virtually 0% at this far out from the cardiac arrest.  He may end up in a persistent vegetative state which is not a good quality of life.  If there are no major changes over the next 2 days (7 days from onset), I recommend withdrawal of care.    Rogue Jury, MS, MD    To contact Stroke Continuity provider, please refer to [email or web address] After hours, contact General Neurology

## 2020-03-12 NOTE — Progress Notes (Signed)
eLink Physician-Brief Progress Note Patient Name: Alston Nestor DOB: 08/23/1956 MRN: [id number]   Date of Service  03/12/2020  HPI/Events of Note  Per neurology recommendation Heparin infusion should be discontinued.  eICU Interventions  Heparin infusion discontinued.        [name] Nashonda Limberg 03/12/2020, 5:51 AM

## 2020-03-12 NOTE — Consult Note (Signed)
Requesting Physician: Dr. Lucile Shutters    Reason for consult : MRI brain findings of stroke  History obtained from: Patient and Chart    HPI:                                                                                                                                       Kenneth Sullivan is a 64 y.o. male past medical history of alcohol abuse and tobacco abuse admitted on 3/23 after cardiac arrest and massive PE associated with RV strain.  Approximate downtime 20 minutes to ROSC.  TTM to 36 degrees.  Patient on heparin drip, temporarily stopped but then resumed.  Patient remained encephalopathic and therefore MRI brain was obtained.  MRI resulted on 3/28 showed multifocal acute to subacute infarcts involving bilateral cerebral and cerebellar hemisphere with associated hemorrhagic transformation in the right parietal lobe and other multiple petechial hemorrhages.  Radiology report also felt sequelae of PRES to be consideration.  Date last known well: 3/23 tPA Given: no, outside window Baseline MRS 0  Intracerebral Hemorrhage (ICH) Score  Glascow Coma Score  3-4  2  Age >/= 80 yes no 0  ICH volume >/= 47ml  no 0  IVH yes no 0  Infratentorial origin no 0 Total:  2   Past Medical History:  Diagnosis Date  . Cardiac arrest ([place]) 03/07/2020  . Known health problems: none    No recent medical care, has not had a physical in years    Past Surgical History:  Procedure Laterality Date  . ESOPHAGOGASTRODUODENOSCOPY (EGD) WITH PROPOFOL N/A 03/07/2020   Procedure: ESOPHAGOGASTRODUODENOSCOPY (EGD) WITH PROPOFOL;  Surgeon: Wilford Corner, MD;  Location: [place];  Service: Endoscopy;  Laterality: N/A;  . IR ANGIOGRAM PULMONARY BILATERAL SELECTIVE  03/07/2020  . IR ANGIOGRAM SELECTIVE EACH ADDITIONAL VESSEL  03/07/2020  . IR ANGIOGRAM SELECTIVE EACH ADDITIONAL VESSEL  03/07/2020  . IR ANGIOGRAM SELECTIVE EACH ADDITIONAL VESSEL  03/07/2020  . IR ANGIOGRAM VISCERAL SELECTIVE  03/07/2020  . IR EMBO  ART  VEN HEMORR LYMPH EXTRAV  INC GUIDE ROADMAPPING  03/07/2020  . IR IVC FILTER PLMT / S&I /IMG GUID/MOD SED  03/07/2020  . IR THROMBECT PRIM MECH INIT (INCLU) MOD SED  03/07/2020  . IR US GUIDE VASC ACCESS RIGHT  03/07/2020  . IR US GUIDE VASC ACCESS RIGHT  03/07/2020  . IR US GUIDE VASC ACCESS RIGHT  03/07/2020    Family History  Problem Relation Age of Onset  . Heart attack Neg Hx    Social History:  reports that he has been smoking cigarettes. He has been smoking about 1.00 pack per day. He has never used smokeless tobacco. He reports current alcohol use of about 21.0 standard drinks of alcohol per week. He reports current drug use. Drug: Marijuana.  Allergies: No Known Allergies  Medications:  I reviewed home medications   ROS:                                                                                                                                     Unable to obtain   Examination:                                                                                                      General: Appears well-developed  Psych: Unable to obtain Eyes: No scleral injection HENT: No OP obstrucion Head: Normocephalic.  Cardiovascular: Normal rate and regular rhythm.  Respiratory: Breathing with assistance of ventilator GI: Soft.  No distension. There is no tenderness.  Skin: WDI    Neurological Examination Mental Status: Patient does not respond to verbal stimuli.  Does not respond to deep sternal rub.  Does not follow commands.  No verbalizations noted.  Cranial Nerves: II: Pupils are equal and reactive approximately 3 mm bilaterally III,IV,VI: doll's response absent present V,VII: corneal reflex: Present VIII: patient does not respond to verbal stimuli IX,X: Cough reflex present XI: trapezius strength unable to test bilaterally XII: tongue strength unable to  test Motor: Extremities flaccid throughout.  No spontaneous movement noted.  No purposeful movements noted.  Increased tone in bilateral lower extremities.  No posturing appreciated Sensory: Does not respond to noxious stimuli in any extremity. Deep Tendon Reflexes: 1+ bilateral biceps reflex, 2+ patellar reflexes Plantars: Mute Cerebellar: Unable to perform  Lab Results: Basic Metabolic Panel: Recent Labs  Lab 03/08/20 0337 03/08/20 0342 03/08/20 2100 03/09/20 0025 03/09/20 0300 03/09/20 0300 03/09/20 1536 03/10/20 0518 03/10/20 1100 03/11/20 0422  NA 138   < > 135   < > 135  --  134* 135 136 135  K 3.9   < > 4.0   < > 4.0  --  4.1 4.2 4.4 4.3  CL 109   < > 107  --  106  --   --  104 105 104  CO2 21*   < > 19*  --  20*  --   --  19* 22 22  GLUCOSE 96   < > 91  --  95  --   --  62* 67* 115*  BUN 7*   < > 7*  --  8  --   --  11 10 11   CREATININE 0.77   < > 0.77  --  0.77  --   --  0.98 0.90 0.76  CALCIUM 7.9*   < >  8.1*   < > 7.9*   < >  --  8.5* 8.4* 8.6*  MG 2.1  --   --   --  1.8  --   --  1.8  --  2.0  PHOS 3.3  --   --   --  3.6  --   --  3.9  --  3.6   < > = values in this interval not displayed.    CBC: Recent Labs  Lab 03/07/20 0710 03/07/20 1144 03/09/20 0300 03/09/20 0300 03/09/20 1536 03/09/20 1603 03/10/20 0518 03/10/20 1100 03/11/20 0422  WBC 7.1   < > 10.6*  --   --  10.7* 12.1* 11.1* 6.8  NEUTROABS 3.3  --   --   --   --   --  9.8*  --   --   HGB 12.0*   < > 9.9*   < > 11.9* 10.4* 10.8* 10.7* 10.4*  HCT 40.9   < > 30.5*   < > 35.0* 32.5* 33.8* 33.1* 31.9*  MCV 111.7*   < > 100.7*  --   --  100.9* 100.0 100.9* 100.0  PLT 100*   < > 141*  --   --  153 163 171 160   < > = values in this interval not displayed.    Coagulation Studies: No results for input(s): LABPROT, INR in the last 72 hours.  Imaging: MR BRAIN WO CONTRAST  Result Date: 03/12/2020 CLINICAL DATA:  Initial evaluation for anoxic brain injury. History of asystolic arrest. EXAM:  MRI HEAD WITHOUT CONTRAST TECHNIQUE: Multiplanar, multiecho pulse sequences of the brain and surrounding structures were obtained without intravenous contrast. COMPARISON:  Prior head CT from 03/07/2020. FINDINGS: Brain: Generalized age-related cerebral atrophy. Multiple and extensive scattered foci of restricted diffusion seen involving the bilateral cerebral hemispheres, right slightly worse than left, and most pronounced posteriorly within the parieto-occipital regions. Infarcts are largely watershed in distribution. Patchy involvement of the left greater than right basal ganglia and left thalamus. Additional scattered ischemic changes within the cerebellar hemispheres bilaterally. Relative sparing of the brainstem. Findings felt to be most consistent with a global hypoperfusion insult related to recent cardiac arrest. Multiple scattered foci of susceptibility artifacts seen associated with several of these areas of ischemia, consistent with petechial hemorrhage. Additionally, there is hemorrhagic transformation with and intraparenchymal hematoma seen at the right parietal lobe measuring 3.6 x 3.0 x 5.3 cm (series 16, image 37). Associated internal fluid-fluid levels with probable layering hematocrit. Additional parenchymal hematoma seen inferiorly at the left occipital pole measuring 11 mm (series 16, image 26). Mild localized edema about these hemorrhages without significant regional mass effect. No mass lesion or midline shift. No hydrocephalus or extra-axial fluid collection. Pituitary gland suprasellar region normal. Midline structures intact. Vascular: Major intracranial vascular flow voids are maintained. No findings to suggest dural sinus thrombosis. Skull and upper cervical spine: Craniocervical junction within normal limits. Bone marrow signal intensity normal. No scalp soft tissue abnormality. Sinuses/Orbits: Globes and orbital soft tissues within normal limits. Paranasal sinuses are clear. Fluid seen  layering within the nasopharynx. Patient likely intubated. Trace bilateral mastoid effusions. Other: None. IMPRESSION: 1. Multifocal acute to early subacute ischemic infarcts involving the bilateral cerebral and cerebellar hemispheres as above. Findings felt to be most consistent with a global hypoperfusion event related to recent cardiac arrest. Sequelae of PRES would be the primary differential consideration. 2. Associated scattered petechial hemorrhage about many of these areas of ischemia. Evidence for hemorrhagic transformation at  the right parietal lobe with 3.6 x 3.0 x 5.3 cm cortically based intraparenchymal hematoma. Additional smaller 11 mm hematoma at the left occipital pole. Associated mild localized edema without significant regional mass effect or midline shift. 3. Generalized age-related cerebral atrophy. Critical Value/emergent results were called by telephone at the time of interpretation on 03/12/2020 at 12:53 am to provider Dr. Lucile Shutters, who verbally acknowledged these results. Electronically Signed   By: Jeannine Boga M.D.   On: 03/12/2020 00:45   DG CHEST PORT 1 VIEW  Result Date: 03/11/2020 CLINICAL DATA:  Endotracheal tube placement. EXAM: PORTABLE CHEST 1 VIEW COMPARISON:  March 10, 2020 4:53 a.m. FINDINGS: Endotracheal tube is identified distal tip 4.3 cm from carina. Left central venous line is identified distal tip in the superior vena cava. The lungs are clear. There is no pleural line to suggest pneumothorax. The mediastinal contour and cardiac silhouette are normal. The bony structures are stable. IMPRESSION: Endotracheal tube distal tip 4.3 cm from carina. Left central venous line distal tip in the superior vena cava. There is no pneumothorax. Electronically Signed   By: Abelardo Diesel M.D.   On: 03/11/2020 08:57   DG Chest Port 1 View  Result Date: 03/10/2020 CLINICAL DATA:  Acute respiratory failure EXAM: PORTABLE CHEST 1 VIEW COMPARISON:  Yesterday FINDINGS: Endotracheal  tube tip is halfway between the clavicular heads and carina. Left subclavian line with tip at the SVC. There is no edema, consolidation, effusion, or pneumothorax. Mild streaky density at the bases. Normal heart size and mediastinal contours. IMPRESSION: 1. Unremarkable hardware positioning. 2. Mild atelectasis at the bases. Electronically Signed   By: Monte Fantasia M.D.   On: 03/10/2020 05:21   DG Abd Portable 1V  Result Date: 03/11/2020 CLINICAL DATA:  Pre MRI are valuation for foreign body. EXAM: PORTABLE ABDOMEN - 1 VIEW COMPARISON:  None FINDINGS: IVC filter is in place. Vascular coils in place in the upper abdomen. Bowel stent in the rectum. No visible bullet fragments or other worrisome metallic foreign bodies in the abdomen. Bowel gas pattern is normal. No bone abnormality. IMPRESSION: No metallic foreign bodies that would preclude the performance of MRI. Electronically Signed   By: Lorriane Shire M.D.   On: 03/11/2020 18:15     ASSESSMENT AND PLAN   64 y.o. male past medical history of alcohol abuse and tobacco abuse admitted on 3/23 after cardiac arrest and massive PE associated with RV strain, now having bihemispheric acute ischemic infarct with hemorrhagic conversion-continue to hold anticoagulation. Blood pressure has mostly been hypotensive since admission making PRES less likely.  Blood cultures negative so far.   Acute bihemispheric cerebral and cerebellar infarcts with petechial hemorrhage  Moderate sized right parietal hemorrhage with no significant midline shift Possible anoxic brain injury Coma on presentation Alcohol abuse Massive pulmonary embolus with right heart strain Acute GI bleeding Circulatory shock Lower extremity DVT  #Transthoracic Echo: Completed, severely reduced and dilated RV.  Left ventricle EF 60 to 65%, consider TEE if patient were to improve clinically and  make recovery #No antiplatelet/will discontinue anticoagulation with heparin drip due to  intracranial hemorrhage #Hold statin for now due to hemorrhage # BP goal: Less than [id number] systolic # HBAIC and Lipid profile # Telemetry monitoring # Frequent neuro checks #  stroke swallow screen #PT/OT evaluation  Goals of care/prognosis: At least from stroke standpoint, infarcts are scattered throughout the cortex and subcortical structures-most of this language center, motor strip is spared-would expect his most prominent deficits to be  vision as well as cognitive dysfunction.  If stroke alone was his only major medical issue, there may be a possibility of patient having a moderate recovery.  However, because there is also concern for anoxic brain injury, prognosis is more guarded.  In addition to this he has heart failure, PE, DVT, GI bleed. Consider palliative care team consult to help family assist in goals of care discussion.     This patient is neurologically critically ill due to acute stroke and hemorrhage.   He is at risk for significant risk of neurological worsening from cerebral edema,  death from brain herniation, heart failure, hemorrhagic conversion, infection, respiratory failure and seizure. This patient's care requires constant monitoring of vital signs, hemodynamics, respiratory and cardiac monitoring, review of multiple databases, neurological assessment, discussion with family, other specialists and medical decision making of high complexity.  I spent 45 minutes of neurocritical time in the care of this patient.     Please page stroke NP  Or  PA  Or MD from 8am -4 pm  as this patient from this time will be  followed by the stroke.   You can look them up on [email or web address]  Password [facility]   Viet Kemmerer [facility] Pager Number [phone]

## 2020-03-12 NOTE — Progress Notes (Signed)
NAME:  Kenneth Sullivan, MRN:  [id number], DOB:  09-09-1956, LOS: 5 ADMISSION DATE:  03/07/2020, CONSULTATION DATE:  03/07/20 REFERRING MD:  Dr Roxanne Mins MD, CHIEF COMPLAINT:  Cardiac arrest  Brief History   64 year old with no significant past medical history.  Had acute shortness of breath with sats in the 40s on EMS arrival.  He had a witnessed bradycardic, asystolic arrest when EMS was present with 3 rounds of epi, 20 minutes to ROSC. Underwent normothermia protocol.  Work-up significant for massive PE, DVT with RV strain, GI bleed from gastric ulcer.  Past Medical History  Has not seen a doctor.  No past medical history  Smokes 1 pack/day, drinks 2 to 3 40 ounce beers every day.  Smokes marijuana.  Denies any other recreational drug use.  Significant Hospital Events   3/23-Admit, underwent EGD with findings of oozing gastric ulcer with clot, IR for mechanical thrombectomy, IVC filter and GDA embolization 3/24- Started heparin drip   Consults:  Cardiology, GI, interventional radiology, neurolgoy  Procedures:    Significant Diagnostic Tests:  Chest x-ray 3/23-ET tube in stable portion, no acute cardiopulmonary abnormality. CTA 3/23-bilateral PE with RV/with ratio 1.9, emphysema CT head 3/23-chronic microvascular changes.  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 Lower extremity duplex MRI brain 3/27 shows multiple acute and subacute ischemic infarcts from the cardiac arrest and scattered petechial hemorrhages consistent with hemorrhagic transformation, mild localized edema Micro Data:  Bcx 3/23-negative [name] 3/23-negative  Antimicrobials:   Interim history/subjective:  On 40 mcg propofol this morning. Not responsive.  Objective   Blood pressure (!) 99/54, pulse 84, temperature 98.1 F (36.7 C), temperature source Axillary, resp. rate 15, height 6\' 2"  (1.88 m), weight 84.8 kg, SpO2 97 %.    Vent Mode:  PRVC FiO2 (%):  [40 %-50 %] 40 % Set Rate:  [15 bmp] 15 bmp Vt Set:  [650 mL] 650 mL PEEP:  [5 cmH20] 5 cmH20 Plateau Pressure:  [14 cmH20-17 cmH20] 15 cmH20   Intake/Output Summary (Last 24 hours) at 03/12/2020 1356 Last data filed at 03/12/2020 0700 Gross per 24 hour  Intake 2639.05 ml  Output 1545 ml  Net 1094.05 ml   Filed Weights   03/10/20 0500 03/11/20 0303 03/12/20 0500  Weight: 85.2 kg 83.5 kg 84.8 kg    Examination: Gen:   Chronically and critically ill appearing male, appears older than stated age. Intubated, sedated.  HEENT:  NCAT. ETT secure. Anicteric sclera.  Neck:    No masses; no thyromegaly, ETT Lungs:   Clear to auscultation bilaterally; normal respiratory effort CV:    Regular rate and rhythm; no murmurs Abd:  + bowel sounds; soft, non-tender; no palpable masses, no distension Ext:    RLE edema > LLE edema. BUE 1+ edema. No obvious joint deformity  Neuro: Sedated not responsive  Labs and imaging personally reviewed: Chest xray 3/27Stable ETT and CVC.  Glucose 151, Cr 0.62, Mg and K wnl. Hgb stable 9.1  Resolved Hospital Problem list    Hypoglycemia Transaminitis  Assessment & Plan:   Cardiac arrest with asystole secondary to massive  PE -downtime 20 minutes -initial CT H without acute abnormality P -Normothermia -MRI with multiple acute and subacute infarcts with evidence of hemorrhagic conversion. Affecting language and speech center of brain.  Acute respiratory failure secondary to massive pulmonary embolism with acute cor pulmonale -Underwent mechanical thrombectomy and IVC filter- yield on the thrombectomy was minimal as clot was  adherent and he likely has subacute clot -Not a candidate for ECMO P -Continue heparin, appreciate pharmacist assistance -Monitor for s/sx bleeding -Continue MV support  -VAP bundle   Shock -circulatory shock in setting of cardiac arrest, possible component of obstructive in setting of PE now s/p  thrombectomy -suspect adequate volume resuscitation at this time P -NE for MAP goal > 65  -Continue Heparin for PE as above  - he is 15L positive since admission - in the setting of RV failure he will require diuresis. Will give 40 mg iv lasix today.  Acute GI bleed, gastric ulcer Not amenable for endoscopic intervention Underwent GDA embolization.  No evidence of recurrent bleed though hemoglobin is lower today P -Close monitoring of CBC, especially in setting of heparin gtt -If bleeds, may need consideration of gastrectomy  -Continue PPI drip. -GI rec to pursue TPN and avoid gastric tubes x 2 weeks   Lower extremity DVT S/p IVC filter.   P -Can be removed in 3 months if he survives this admission.   Goals of Care -PCCM has discussed in detail with next of kin--see below.   Best practice:  Diet: TPN  Pain/Anxiety/Delirium protocol (if indicated): Propofol, fentanyl as needed.  RASS goal -1 - VAP protocol (if indicated): Ordered DVT prophylaxis: Heparin gtt  GI prophylaxis: Pepcid Glucose control: SSI Mobility: Bed Code Status: Full Family Communication: I spoke with [name] over the phone today 3/29.  Updated with results of MRI showing multiple strokes and bleeding in his brain. Expressed that there was concern that he would not be able to walk and talk and eat again. He also may not be able to breathe on his own. She was very upset with these findings.  Expressed that uncle would not want to be alive on machines but she needs some time to process this information before making any decisions. Discussed a potential transition to comfort measures at some point. Disposition: ICU   CRITICAL CARE The patient is critically ill with multiple organ systems failure and requires high complexity decision making for assessment and support, frequent evaluation and titration of therapies, application of advanced monitoring technologies and extensive interpretation of multiple databases.    Critical Care Time devoted to patient care services described in this note is 44 minutes. This time reflects time of care of this [place] . This critical care time does not reflect separately billable procedures or procedure time, teaching time or supervisory time of PA/NP/Med student/Med Resident etc but could involve care discussion time.  [facility] 03/12/2020 1:56 PM  Pager: [phone] After hours pager: [phone]

## 2020-03-12 NOTE — Progress Notes (Signed)
eLink Physician-Brief Progress Note Patient Name: Kenneth Sullivan DOB: 04-Jun-1956 MRN: [id number]   Date of Service  03/12/2020  HPI/Events of Note  Pt with bilateral cerebral and cerebellar ischemic strokes with areas of hemorrhagic conversion.  eICU Interventions  Stroke neurology consulted. Blood pressure target per neurology  SBP < 140 mmHg.        Inessa Wardrop U Hadassah Rana 03/12/2020, 1:30 AM

## 2020-03-12 NOTE — Progress Notes (Signed)
Carotid artery duplex completed.  03/12/2020 3:17 PM Kelby Aline., MHA, RVT, RDCS, RDMS

## 2020-03-13 ENCOUNTER — Other Ambulatory Visit (HOSPITAL_COMMUNITY): Payer: Medicaid Other

## 2020-03-13 DIAGNOSIS — G931 Anoxic brain damage, not elsewhere classified: Secondary | ICD-10-CM | POA: Diagnosis not present

## 2020-03-13 DIAGNOSIS — I63429 Cerebral infarction due to embolism of unspecified anterior cerebral artery: Secondary | ICD-10-CM | POA: Diagnosis not present

## 2020-03-13 DIAGNOSIS — I469 Cardiac arrest, cause unspecified: Secondary | ICD-10-CM | POA: Diagnosis not present

## 2020-03-13 DIAGNOSIS — J9601 Acute respiratory failure with hypoxia: Secondary | ICD-10-CM | POA: Diagnosis not present

## 2020-03-13 LAB — PHOSPHORUS: Phosphorus: 3.5 mg/dL (ref 2.5–4.6)

## 2020-03-13 LAB — CBC WITH DIFFERENTIAL/PLATELET
Abs Immature Granulocytes: 0.06 10*3/uL (ref 0.00–0.07)
Basophils Absolute: 0 10*3/uL (ref 0.0–0.1)
Basophils Relative: 0 %
Eosinophils Absolute: 0.1 10*3/uL (ref 0.0–0.5)
Eosinophils Relative: 1 %
HCT: 25.7 % — ABNORMAL LOW (ref 39.0–52.0)
Hemoglobin: 8.8 g/dL — ABNORMAL LOW (ref 13.0–17.0)
Immature Granulocytes: 1 %
Lymphocytes Relative: 9 %
Lymphs Abs: 0.9 10*3/uL (ref 0.7–4.0)
MCH: 32.7 pg (ref 26.0–34.0)
MCHC: 34.2 g/dL (ref 30.0–36.0)
MCV: 95.5 fL (ref 80.0–100.0)
Monocytes Absolute: 0.8 10*3/uL (ref 0.1–1.0)
Monocytes Relative: 8 %
Neutro Abs: 8 10*3/uL — ABNORMAL HIGH (ref 1.7–7.7)
Neutrophils Relative %: 81 %
Platelets: 159 10*3/uL (ref 150–400)
RBC: 2.69 MIL/uL — ABNORMAL LOW (ref 4.22–5.81)
RDW: 16 % — ABNORMAL HIGH (ref 11.5–15.5)
WBC: 9.9 10*3/uL (ref 4.0–10.5)
nRBC: 0.2 % (ref 0.0–0.2)

## 2020-03-13 LAB — GLUCOSE, CAPILLARY
Glucose-Capillary: 109 mg/dL — ABNORMAL HIGH (ref 70–99)
Glucose-Capillary: 115 mg/dL — ABNORMAL HIGH (ref 70–99)
Glucose-Capillary: 119 mg/dL — ABNORMAL HIGH (ref 70–99)
Glucose-Capillary: 125 mg/dL — ABNORMAL HIGH (ref 70–99)
Glucose-Capillary: 130 mg/dL — ABNORMAL HIGH (ref 70–99)
Glucose-Capillary: 89 mg/dL (ref 70–99)

## 2020-03-13 LAB — LIPID PANEL
Cholesterol: 79 mg/dL (ref 0–200)
HDL: 39 mg/dL — ABNORMAL LOW (ref >40)
LDL Cholesterol: 31 mg/dL (ref 0–99)
Total CHOL/HDL Ratio: 2 RATIO
Triglycerides: 47 mg/dL (ref <150)
VLDL: 9 mg/dL (ref 0–40)

## 2020-03-13 LAB — BASIC METABOLIC PANEL
Anion gap: 9 (ref 5–15)
BUN: 14 mg/dL (ref 8–23)
CO2: 28 mmol/L (ref 22–32)
Calcium: 8.3 mg/dL — ABNORMAL LOW (ref 8.9–10.3)
Chloride: 105 mmol/L (ref 98–111)
Creatinine, Ser: 0.67 mg/dL (ref 0.61–1.24)
GFR calc Af Amer: >60 mL/min (ref >60)
GFR calc non Af Amer: >60 mL/min (ref >60)
Glucose, Bld: 145 mg/dL — ABNORMAL HIGH (ref 70–99)
Potassium: 3.4 mmol/L — ABNORMAL LOW (ref 3.5–5.1)
Sodium: 142 mmol/L (ref 135–145)

## 2020-03-13 LAB — MAGNESIUM: Magnesium: 1.9 mg/dL (ref 1.7–2.4)

## 2020-03-13 LAB — PREALBUMIN: Prealbumin: <5 mg/dL — ABNORMAL LOW (ref 18–38)

## 2020-03-13 MED ORDER — SODIUM CHLORIDE 0.9 % IV SOLN: INTRAVENOUS | Status: DC | PRN

## 2020-03-13 MED ORDER — MAGNESIUM SULFATE 2 GM/50ML IV SOLN
2.0000 g | Freq: Once | INTRAVENOUS | Status: AC
  Administered 2020-03-13: 2 g via INTRAVENOUS
  Filled 2020-03-13: qty 50

## 2020-03-13 MED ORDER — POTASSIUM CHLORIDE 10 MEQ/50ML IV SOLN
10.0000 meq | INTRAVENOUS | Status: AC
  Administered 2020-03-13 (×5): 10 meq via INTRAVENOUS
  Filled 2020-03-13 (×5): qty 50

## 2020-03-13 MED ORDER — SODIUM CHLORIDE 0.9 % IV SOLN
INTRAVENOUS | Status: DC | PRN
  Administered 2020-03-13 (×2): 250 mL via INTRAVENOUS

## 2020-03-13 NOTE — Progress Notes (Signed)
STROKE TEAM PROGRESS NOTE      INTERVAL HISTORY I have personally reviewed history of presenting illness, electronic medical records and imaging films in PACS.  Patient condition remains critically ill requiring Levophed for hemodynamic support and propofol for sedation.  He remains on ventilatory support for respiratory failure.  Neurological exam remains poor with barely any response to stimulus.  MRI scan shows multiple by cerebral embolic infarcts with changes on flair which may also raise possibility of posterior reversible encephalopathy syndrome.  Small amount of right parietal and left occipital hematoma have led to IV heparin being discontinued    OBJECTIVE Vitals:   03/13/20 1155 03/13/20 1200 03/13/20 1215 03/13/20 1230  BP: 116/61 95/69    Pulse: 94 99 (!) 105 (!) 109  Resp: 17 18 (!) 22 (!) 21  Temp: 98.4 F (36.9 C) 98.4 F (36.9 C) 98.6 F (37 C) 98.4 F (36.9 C)  TempSrc:      SpO2: 96% 95% 94% 95%  Weight:      Height:        CBC:  Recent Labs  Lab 03/10/20 0518 03/10/20 1100 03/12/20 0432 03/13/20 0439  WBC 12.1*   < > 10.5 9.9  NEUTROABS 9.8*  --   --  8.0*  HGB 10.8*   < > 9.1* 8.8*  HCT 33.8*   < > 27.8* 25.7*  MCV 100.0   < > 98.6 95.5  PLT 163   < > 188 159   < > = values in this interval not displayed.    Basic Metabolic Panel:  Recent Labs  Lab 03/12/20 0432 03/13/20 0439  NA 138 142  K 4.2 3.4*  CL 106 105  CO2 25 28  GLUCOSE 151* 145*  BUN 11 14  CREATININE 0.62 0.67  CALCIUM 8.4* 8.3*  MG 2.1 1.9  PHOS 3.2 3.5    Lipid Panel:     Component Value Date/Time   CHOL 79 03/13/2020 0442   TRIG 47 03/13/2020 0442   HDL 39 (L) 03/13/2020 0442   CHOLHDL 2.0 03/13/2020 0442   VLDL 9 03/13/2020 0442   LDLCALC 31 03/13/2020 0442   HgbA1c:  Lab Results  Component Value Date   HGBA1C 5.2 03/07/2020   Urine Drug Screen:     Component Value Date/Time   LABOPIA NONE DETECTED 03/07/2020 0847   COCAINSCRNUR NONE DETECTED  03/07/2020 0847   LABBENZ NONE DETECTED 03/07/2020 0847   AMPHETMU NONE DETECTED 03/07/2020 0847   THCU NONE DETECTED 03/07/2020 0847   LABBARB NONE DETECTED 03/07/2020 0847    Alcohol Level     Component Value Date/Time   ETH <10 03/07/2020 1243    IMAGING   MR BRAIN WO CONTRAST  Result Date: 03/12/2020 CLINICAL DATA:  Initial evaluation for anoxic brain injury. History of asystolic arrest. EXAM: MRI HEAD WITHOUT CONTRAST TECHNIQUE: Multiplanar, multiecho pulse sequences of the brain and surrounding structures were obtained without intravenous contrast. COMPARISON:  Prior head CT from 03/07/2020. FINDINGS: Brain: Generalized age-related cerebral atrophy. Multiple and extensive scattered foci of restricted diffusion seen involving the bilateral cerebral hemispheres, right slightly worse than left, and most pronounced posteriorly within the parieto-occipital regions. Infarcts are largely watershed in distribution. Patchy involvement of the left greater than right basal ganglia and left thalamus. Additional scattered ischemic changes within the cerebellar hemispheres bilaterally. Relative sparing of the brainstem. Findings felt to be most consistent with a global hypoperfusion insult related to recent cardiac arrest. Multiple scattered foci of susceptibility artifacts seen  associated with several of these areas of ischemia, consistent with petechial hemorrhage. Additionally, there is hemorrhagic transformation with and intraparenchymal hematoma seen at the right parietal lobe measuring 3.6 x 3.0 x 5.3 cm (series 16, image 37). Associated internal fluid-fluid levels with probable layering hematocrit. Additional parenchymal hematoma seen inferiorly at the left occipital pole measuring 11 mm (series 16, image 26). Mild localized edema about these hemorrhages without significant regional mass effect. No mass lesion or midline shift. No hydrocephalus or extra-axial fluid collection. Pituitary gland  suprasellar region normal. Midline structures intact. Vascular: Major intracranial vascular flow voids are maintained. No findings to suggest dural sinus thrombosis. Skull and upper cervical spine: Craniocervical junction within normal limits. Bone marrow signal intensity normal. No scalp soft tissue abnormality. Sinuses/Orbits: Globes and orbital soft tissues within normal limits. Paranasal sinuses are clear. Fluid seen layering within the nasopharynx. Patient likely intubated. Trace bilateral mastoid effusions. Other: None. IMPRESSION: 1. Multifocal acute to early subacute ischemic infarcts involving the bilateral cerebral and cerebellar hemispheres as above. Findings felt to be most consistent with a global hypoperfusion event related to recent cardiac arrest. Sequelae of PRES would be the primary differential consideration. 2. Associated scattered petechial hemorrhage about many of these areas of ischemia. Evidence for hemorrhagic transformation at the right parietal lobe with 3.6 x 3.0 x 5.3 cm cortically based intraparenchymal hematoma. Additional smaller 11 mm hematoma at the left occipital pole. Associated mild localized edema without significant regional mass effect or midline shift. 3. Generalized age-related cerebral atrophy. Critical Value/emergent results were called by telephone at the time of interpretation on 03/12/2020 at 12:53 am to provider Dr. Lucile Shutters, who verbally acknowledged these results. Electronically Signed   By: Jeannine Boga M.D.   On: 03/12/2020 00:45   DG Abd Portable 1V  Result Date: 03/11/2020 CLINICAL DATA:  Pre MRI are valuation for foreign body. EXAM: PORTABLE ABDOMEN - 1 VIEW COMPARISON:  None FINDINGS: IVC filter is in place. Vascular coils in place in the upper abdomen. Bowel stent in the rectum. No visible bullet fragments or other worrisome metallic foreign bodies in the abdomen. Bowel gas pattern is normal. No bone abnormality. IMPRESSION: No metallic foreign bodies  that would preclude the performance of MRI. Electronically Signed   By: Lorriane Shire M.D.   On: 03/11/2020 18:15   VAS US CAROTID  Result Date: 03/12/2020 Carotid Arterial Duplex Study Indications:       CVA. Limitations        Today's exam was limited due to patient on a ventilator and                    bandaging on right neck. Comparison Study:  No prior study Performing Technologist: Maudry Mayhew MHA, RDMS, RVT, RDCS  Examination Guidelines: A complete evaluation includes B-mode imaging, spectral Doppler, color Doppler, and power Doppler as needed of all accessible portions of each vessel. Bilateral testing is considered an integral part of a complete examination. Limited examinations for reoccurring indications may be performed as noted.  Right Carotid Findings: +----------+--------+--------+--------+------------------+--------+           PSV cm/sEDV cm/sStenosisPlaque DescriptionComments +----------+--------+--------+--------+------------------+--------+ CCA Distal37      13                                         +----------+--------+--------+--------+------------------+--------+ ICA Prox  97      43                                         +----------+--------+--------+--------+------------------+--------+  ICA Distal64      32                                         +----------+--------+--------+--------+------------------+--------+ ECA       20      5                                          +----------+--------+--------+--------+------------------+--------+ +---------+--------+--+--------+--+---------+ VertebralPSV cm/s36EDV cm/s12Antegrade +---------+--------+--+--------+--+---------+  Left Carotid Findings: +----------+--------+--------+--------+------------------+--------+           PSV cm/sEDV cm/sStenosisPlaque DescriptionComments +----------+--------+--------+--------+------------------+--------+ CCA Prox  68      26                                          +----------+--------+--------+--------+------------------+--------+ CCA Distal54      23                                         +----------+--------+--------+--------+------------------+--------+ ICA Prox  49      25                                         +----------+--------+--------+--------+------------------+--------+ ICA Distal101     53                                         +----------+--------+--------+--------+------------------+--------+ ECA       38      12                                         +----------+--------+--------+--------+------------------+--------+ +----------+--------+--------+----------------+-------------------+           PSV cm/sEDV cm/sDescribe        Arm Pressure (mmHG) +----------+--------+--------+----------------+-------------------+ Subclavian100             Multiphasic, WNL                    +----------+--------+--------+----------------+-------------------+ +---------+--------+--+--------+--+---------+ VertebralPSV cm/s57EDV cm/s24Antegrade +---------+--------+--+--------+--+---------+   Summary: Right Carotid: Velocities in the right ICA are consistent with an upper range                1-39% stenosis. Left Carotid: Velocities in the left ICA are consistent with a 1-39% stenosis. Vertebrals:  Bilateral vertebral arteries demonstrate antegrade flow. Subclavians: Normal flow hemodynamics were seen in bilateral subclavian              arteries. *See table(s) above for measurements and observations.  Electronically signed by Deitra Mayo MD on 03/12/2020 at 6:35:45 PM.    Final      Transthoracic Echocardiogram  03/07/20 IMPRESSIONS  1. Very difficult study with poor windows. The RV appears severely  dilated with severely reduced function. D shaped septum. Overall, concerns  for pulmonary embolism in this patient with cardiac arrest. Clinical  correlation is recommended.  2.  Left ventricular  ejection fraction, by estimation, is 60 to 65%. The  left ventricle has normal function. Left ventricular endocardial border  not optimally defined to evaluate regional wall motion. Left ventricular  diastolic function could not be  evaluated. There is the interventricular septum is flattened in systole  and diastole, consistent with right ventricular pressure and volume  overload.  3. Right ventricular systolic function is severely reduced. The right  ventricular size is severely enlarged.  4. The mitral valve is grossly normal. No evidence of mitral valve  regurgitation. No evidence of mitral stenosis.  5. The aortic valve is grossly normal. Aortic valve regurgitation is not  visualized. No aortic stenosis is present.  6. Right atrial size was mildly dilated.   Bilateral Carotid Dopplers - will order   ECG - SR rate 99 BPM. (See cardiology reading for complete details)   Overnight EEG  03/08/20 IMPRESSION: This study issuggestive of moderate to severe diffuse encephalopathy, nonspecific etiology but could be secondary to sedation.No seizures or epileptiform discharges were seen throughout the recording. EEG appeared to be improving throughout the study.    PHYSICAL EXAM Blood pressure 95/69, pulse (!) 109, temperature 98.4 F (36.9 C), resp. rate (!) 21, height 6\' 2"  (1.88 m), weight 84.8 kg, SpO2 95 %. Frail middle-age male who is sedated and intubated  Neurological Exam  : patient is sedated intubated and on mechanical ventilation for respiratory failure.  Eyes are closed.  Pupils 3 mm sluggishly reactive.  Corneal reflexes are barely elicitable.  Doll's eye movements absent.  Tonic downgaze eye deviation.  Slight decrease cerebrate posturing in the left upper and lower extremity to sternal rub.  Trace withdrawal in the right lower extremity to painful stimuli none in the right upper extremity.  Both plantars are equivocal.          ASSESSMENT/PLAN [name] is a 64 y.o. male with history of  alcohol abuse and tobacco abuse admitted on 3/23 after cardiac arrest and massive PE - approximately 20 minutes. 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 Sequelae of PRES would be the primary differential consideration.  Resultant  coma  Code Stroke CT Head - not ordered  CT head - not ordered  MRI head - Multifocal acute to early subacute ischemic infarcts involving the bilateral cerebral and cerebellar hemispheres as above. Findings felt to be most consistent with a global hypoperfusion event related to recent cardiac arrest. Sequelae of PRES would be the primary differential consideration. Associated scattered petechial hemorrhage about many of these areas of ischemia. Evidence for hemorrhagic transformation at the right parietal lobe with 3.6 x 3.0 x 5.3 cm cortically based intraparenchymal hematoma. Additional smaller 11 mm hematoma at the left occipital pole. Associated mild localized edema without significant regional mass effect or midline shift. Generalized age-related cerebral atrophy.   MRA head - not ordered  CTA H&N - not ordered  CT Perfusion - not ordered  EEG - This study issuggestive of moderate to severe diffuse encephalopathy, nonspecific etiology but could be secondary to sedation.No seizures or epileptiform discharges were seen throughout the recording. EEG appeared to be improving throughout the study.   Carotid Doppler -bilateral 1-39% carotid stenosis  2D Echo - EF 60 - 65%. No cardiac source of emboli identified.   Sars Corona Virus 2 - negative  LDL -31  mg percent HgbA1c - 5.2  UDS - negative  VTE prophylaxis - SCDs Diet  Diet Order            Diet NPO time specified  Diet effective now              No antithrombotic prior to admission, now on  No antithrombotic  Patient will counseled to be compliant with his antithrombotic medications  Ongoing aggressive stroke risk factor management  Therapy recommendations:  pending  Disposition:  Pending  Hypertension  Home BP meds: none   Current BP meds: Levophed  BP low . Permissive hypertension (OK if < 220/120) but gradually normalize in 5-7 days  . Long-term BP goal normotensive  Hyperlipidemia  Home Lipid lowering medication: none   LDL pending, goal < 70  Current lipid lowering medication: none   Continue statin at discharge  Other Stroke Risk Factors  Advanced age  Cigarette smoker - advised to stop smoking  Other Active Problems  Code status - Full code  Anemia - Hb - 9.1  Hospital day # 6 Patient is critically ill with extensive neurological brain damage from hypoxic injury from his cardiac arrest from pulmonary embolism as well as multiple by cerebral infarcts likely from paradoxical embolism or from cardiac arrhythmia.  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  I spoke to the patient's niece who is his next of kin over the phone and explained the poor prognosis and she understood and is willing to withdraw ventilatory support but would like to wait till Friday this week in order to take care of the arrangements.  I explained to her that patient is so sick that he could possibly pass away when sooner and she understood this.  Discussed with Dr. Lenice Llamas critical care medicine. This patient is critically ill and at significant risk of neurological worsening, death and care requires constant monitoring of vital signs, hemodynamics,respiratory and cardiac monitoring, extensive review of multiple databases, frequent neurological assessment, discussion with family, other specialists and medical decision  making of high complexity.I have made any additions or clarifications directly to the above note.This critical care time does not reflect procedure time, or teaching time or supervisory time of PA/NP/Med Resident etc but could involve care discussion time.  I spent 40 minutes of neurocritical care time  in the care of  this patient.   Antony Contras, MD     To contact Stroke Continuity provider, please refer to [email or web address] After hours, contact General Neurology

## 2020-03-13 NOTE — Progress Notes (Signed)
NAME:  Kenneth Sullivan, MRN:  [id number], DOB:  Jun 26, 1956, LOS: 6 ADMISSION DATE:  03/07/2020, CONSULTATION DATE:  03/07/20 REFERRING MD:  Dr Roxanne Mins MD, CHIEF COMPLAINT:  Cardiac arrest  Brief History   64 year old with no significant past medical history.  Had acute shortness of breath with sats in the 40s on EMS arrival.  He had a witnessed bradycardic, asystolic arrest when EMS was present with 3 rounds of epi, 20 minutes to ROSC. Underwent normothermia protocol.  Work-up significant for massive PE, DVT with RV strain, GI bleed from gastric ulcer.  Past Medical History  Has not seen a doctor.  No past medical history. Smokes 1 pack/day, drinks 2 to 3 40 ounce beers every day.  Smokes marijuana.  Denies any other recreational drug use.  Significant Hospital Events   3/23-Admit, underwent EGD with findings of oozing gastric ulcer with clot, IR for mechanical thrombectomy, IVC filter and GDA embolization 3/24- Started heparin drip  Consults:  Cardiology, GI, interventional radiology, neurolgoy  Procedures:    Significant Diagnostic Tests:  Chest x-ray 3/23-ET tube in stable portion, no acute cardiopulmonary abnormality. CTA 3/23-bilateral PE with RV/with ratio 1.9, emphysema CT head 3/23-chronic microvascular changes.  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 Lower extremity duplex MRI brain 3/27 shows multiple acute and subacute ischemic infarcts from the cardiac arrest and scattered petechial hemorrhages consistent with hemorrhagic transformation, mild localized edema Micro Data:  Bcx 3/23-negative Kenneth Sullivan 3/23-negative  Antimicrobials:   Interim history/subjective:  On 40 mcg propofol this morning. Failed SBT. Not responsive.   Objective   Blood pressure 124/82, pulse 72, temperature 99 F (37.2 C), resp. rate 15, height 6\' 2"  (1.88 m), weight 84.8 kg, SpO2 99 %.    Vent Mode: PRVC FiO2 (%):  [40 %] 40  % Set Rate:  [15 bmp] 15 bmp Vt Set:  [650 mL] 650 mL PEEP:  [5 cmH20] 5 cmH20 Plateau Pressure:  [12 cmH20-15 cmH20] 15 cmH20   Intake/Output Summary (Last 24 hours) at 03/13/2020 0916 Last data filed at 03/13/2020 0800 Gross per 24 hour  Intake 4261.73 ml  Output 3215 ml  Net 1046.73 ml   Filed Weights   03/11/20 0303 03/12/20 0500 03/13/20 0451  Weight: 83.5 kg 84.8 kg 84.8 kg    Examination: Gen:   Chronically and critically ill appearing male, appears older than stated age. Intubated, sedated.  HEENT:  NCAT. ETT secure. Anicteric sclera.  Neck:    No masses; no thyromegaly, ETT Lungs:   Clear to auscultation bilaterally; normal respiratory effort CV:    Regular rate and rhythm; no murmurs Abd:  + bowel sounds; soft, non-tender; no palpable masses, no distension Ext:    RLE edema > LLE edema. BUE 1+ edema. No obvious joint deformity  Neuro: Sedated not responsive  Labs and imaging personally reviewed: Chest xray 3/27Stable ETT and CVC.  Lab Results  Component Value Date   WBC 9.9 03/13/2020   HGB 8.8 (L) 03/13/2020   HCT 25.7 (L) 03/13/2020   MCV 95.5 03/13/2020   PLT 159 03/13/2020     Resolved Hospital Problem list    Hypoglycemia Transaminitis  Assessment & Plan:  Kenneth Sullivan is a 64 y.o. man with polysubstance abuse who presents with:  Cardiac arrest with asystole secondary to massive  PE -downtime 20 minutes -initial CT H without acute abnormality P -S/p TTM. Normothermic. Now with anoxic encephalopathy.   Anoxic Encephalopathy - -MRI with  multiple acute and subacute infarcts with evidence of hemorrhagic conversion. Affecting language and speech center of brain. - unable to give heparin for PE   Acute respiratory failure secondary to massive pulmonary embolism with acute cor pulmonale -Underwent mechanical thrombectomy and IVC filter- yield on the thrombectomy was minimal as clot was adherent and he likely has subacute clot -Not a candidate for  ECMO P - holding heparin due to intracranial hemorrhage -Monitor for s/sx bleeding -Continue MV support  -VAP bundle   Shock -circulatory shock in setting of cardiac arrest, possible component of obstructive in setting of PE now s/p thrombectomy -suspect adequate volume resuscitation at this time P -NE for MAP goal > 65  - he is 15L positive since admission - in the setting of RV failure he will require diuresis.  - continue diuresis with daily lasix 40 mg  Acute GI bleed, gastric ulcer Not amenable for endoscopic intervention Underwent GDA embolization.  No evidence of recurrent bleed though hemoglobin is lower today P -Close monitoring of CBC, especially in setting of heparin gtt -If bleeds, may need consideration of gastrectomy  -Continue PPI drip. -GI rec to pursue TPN and avoid gastric tubes x 2 weeks. However given RV failure would want to avoid the fluid burden of TPN so will hold for now.   Lower extremity DVT S/p IVC filter.   P -Can be removed in 3 months if he survives this admission.   Goals of Care -PCCM has discussed in detail with next of kin--see below.   Best practice:  Diet: TPN  Pain/Anxiety/Delirium protocol (if indicated): Propofol, fentanyl as needed.  RASS goal -1 - VAP protocol (if indicated): Ordered DVT prophylaxis: Heparin gtt  GI prophylaxis: Pepcid Glucose control: SSI Mobility: Bed Code Status: Full Family Communication: I spoke with Kenneth Sullivan over the phone today 3/29.  Updated with results of MRI showing multiple strokes and bleeding in his brain. Expressed that there was concern that he would not be able to walk and talk and eat again. He also may not be able to breathe on his own. She was very upset with these findings.  Expressed that uncle would not want to be alive on machines but she needs some time to process this information before making any decisions. Discussed a potential transition to comfort measures at some point. Disposition:  ICU   CRITICAL CARE The patient is critically ill with multiple organ systems failure and requires high complexity decision making for assessment and support, frequent evaluation and titration of therapies, application of advanced monitoring technologies and extensive interpretation of multiple databases.   Critical Care Time devoted to patient care services described in this note is 34 minutes. This time reflects time of care of this [place] . This critical care time does not reflect separately billable procedures or procedure time, teaching time or supervisory time of PA/NP/Med student/Med Resident etc but could involve care discussion time.  [facility] 03/13/2020 9:16 AM  Pager: [phone] After hours pager: [phone]

## 2020-03-13 NOTE — Progress Notes (Signed)
PHARMACY - TOTAL PARENTERAL NUTRITION CONSULT NOTE   Indication: Gastric ulcer, GIB. GI is reccomending no feeds/no NGT for 2 weeks  Patient Measurements: Height: 6\' 2"  (188 cm) Weight: 186 lb 15.2 oz (84.8 kg) IBW/kg (Calculated) : 82.2   Body mass index is 24 kg/m.  Assessment: 64 yo with cardiac arrest on arrival to hospital found to have upper GI bleed from large distal gastric ulcer s/p embolization without further bleeding. On IV Heparin for bilateral PEs - s/p thrombectomy and IVC filter placement. Avoid any gastric tubes for 2 weeks from admit and avoid enteral feeding during this time as well.  Glucose / Insulin: No history of diabetes. CBGs 119-145, controlled. No insulin required.  Electrolytes: On Lasix 40 IV daily. K 4.2>>3.4 (will replace), Phos up 3.5, Mag 2.1>>1.9 (will replace).  Renal: Scr 0.62, BUN 11 LFTs / TGs: LFTs 41/36, Tbili WNL, TGs 47 Prealbumin / albumin: Prealbumin down <5, Alb 2 Intake / Output; MIVF: UOP 1.6 ml/kg/hr (increased with daily Lasix); Requring Levophed. Propofol running at 40 mcg/kg/min (36mL/hr). LBM 3/28.  GI Imaging: None since TPN started Surgeries / Procedures: None since TPN started  Central access: CVC Triple Lumen 3/23 TPN start date: 3/26  Nutritional Goals (per Rd recommendations on 3/26): kCal: 2247 kcal/day, Protein: 110 - 135 gm/day, Fluid: >2 L/day  Goal TPN rate is 105 mL/hr (provides 120 g of protein and 2300 kcals per day)  Current Nutrition:  NPO  Propofol @ 20 mL/hr = 528 kcals/day  Plan:  Gave KCl 11mEq IV x5 and Mag 2g IV x1 today per protocol with lasix given.  CCM stopping TPN today.  Will reduce TPN rate to 1/2 (50 mL/hr) for 2 hours then discontinue.  Pharmacy will discontinue TPN orders.  Please re-consult if needed.   Sloan Leiter, PharmD, BCPS, BCCCP Clinical Pharmacist Please refer to [facility] for [place] numbers 03/13/2020, 7:44 AM

## 2020-03-13 NOTE — Progress Notes (Signed)
RT attempted to wean pt on CPAP/PS 8/5 40%. Pt tolerated for approximately 10 minutes and was placed back on full support. HR increased from 72 to 109. RR increased from 15 to 39. RT will continue to monitor.

## 2020-03-13 NOTE — Progress Notes (Signed)
[facility] Progress Note Patient Name: Kenneth Sullivan DOB: 1956-03-10 MRN: [id number]   Date of Service  03/13/2020  HPI/Events of Note  AM labs requested   eICU Interventions  CBC and BMP ordered     Intervention Category Minor Interventions: Clinical assessment - ordering diagnostic tests  Margaretmary Lombard 03/13/2020, 12:46 AM

## 2020-03-14 ENCOUNTER — Other Ambulatory Visit (HOSPITAL_COMMUNITY): Payer: Medicaid Other

## 2020-03-14 ENCOUNTER — Inpatient Hospital Stay (HOSPITAL_COMMUNITY): Payer: Medicaid Other

## 2020-03-14 DIAGNOSIS — I2699 Other pulmonary embolism without acute cor pulmonale: Secondary | ICD-10-CM

## 2020-03-14 DIAGNOSIS — J9601 Acute respiratory failure with hypoxia: Secondary | ICD-10-CM | POA: Diagnosis not present

## 2020-03-14 DIAGNOSIS — G931 Anoxic brain damage, not elsewhere classified: Secondary | ICD-10-CM | POA: Diagnosis not present

## 2020-03-14 DIAGNOSIS — I2602 Saddle embolus of pulmonary artery with acute cor pulmonale: Secondary | ICD-10-CM | POA: Diagnosis not present

## 2020-03-14 DIAGNOSIS — I469 Cardiac arrest, cause unspecified: Secondary | ICD-10-CM | POA: Diagnosis not present

## 2020-03-14 LAB — CBC WITH DIFFERENTIAL/PLATELET
Abs Immature Granulocytes: 0.14 10*3/uL — ABNORMAL HIGH (ref 0.00–0.07)
Basophils Absolute: 0 10*3/uL (ref 0.0–0.1)
Basophils Relative: 0 %
Eosinophils Absolute: 0.1 10*3/uL (ref 0.0–0.5)
Eosinophils Relative: 0 %
HCT: 27.3 % — ABNORMAL LOW (ref 39.0–52.0)
Hemoglobin: 9.2 g/dL — ABNORMAL LOW (ref 13.0–17.0)
Immature Granulocytes: 1 %
Lymphocytes Relative: 9 %
Lymphs Abs: 1.2 10*3/uL (ref 0.7–4.0)
MCH: 32.2 pg (ref 26.0–34.0)
MCHC: 33.7 g/dL (ref 30.0–36.0)
MCV: 95.5 fL (ref 80.0–100.0)
Monocytes Absolute: 1.1 10*3/uL — ABNORMAL HIGH (ref 0.1–1.0)
Monocytes Relative: 8 %
Neutro Abs: 11.1 10*3/uL — ABNORMAL HIGH (ref 1.7–7.7)
Neutrophils Relative %: 82 %
Platelets: 187 10*3/uL (ref 150–400)
RBC: 2.86 MIL/uL — ABNORMAL LOW (ref 4.22–5.81)
RDW: 16.1 % — ABNORMAL HIGH (ref 11.5–15.5)
WBC: 13.5 10*3/uL — ABNORMAL HIGH (ref 4.0–10.5)
nRBC: 0 % (ref 0.0–0.2)

## 2020-03-14 LAB — BASIC METABOLIC PANEL
Anion gap: 11 (ref 5–15)
BUN: 16 mg/dL (ref 8–23)
CO2: 27 mmol/L (ref 22–32)
Calcium: 8.3 mg/dL — ABNORMAL LOW (ref 8.9–10.3)
Chloride: 103 mmol/L (ref 98–111)
Creatinine, Ser: 0.62 mg/dL (ref 0.61–1.24)
GFR calc Af Amer: >60 mL/min (ref >60)
GFR calc non Af Amer: >60 mL/min (ref >60)
Glucose, Bld: 97 mg/dL (ref 70–99)
Potassium: 4.4 mmol/L (ref 3.5–5.1)
Sodium: 141 mmol/L (ref 135–145)

## 2020-03-14 LAB — GLUCOSE, CAPILLARY
Glucose-Capillary: 84 mg/dL (ref 70–99)
Glucose-Capillary: 87 mg/dL (ref 70–99)
Glucose-Capillary: 93 mg/dL (ref 70–99)
Glucose-Capillary: 84 mg/dL (ref 70–99)
Glucose-Capillary: 85 mg/dL (ref 70–99)

## 2020-03-14 LAB — MAGNESIUM: Magnesium: 2.3 mg/dL (ref 1.7–2.4)

## 2020-03-14 NOTE — Progress Notes (Signed)
NAME:  Kenneth Sullivan, MRN:  [id number], DOB:  [birth year], LOS: 7 ADMISSION DATE:  03/07/2020, CONSULTATION DATE:  03/07/20 REFERRING MD:  Dr Roxanne Mins MD, CHIEF COMPLAINT:  Cardiac arrest  Brief History   64 year old with no significant past medical history.  Had acute shortness of breath with sats in the 40s on EMS arrival.  He had a witnessed bradycardic, asystolic arrest when EMS was present with 3 rounds of epi, 20 minutes to ROSC. Underwent normothermia protocol.  Work-up significant for massive PE, DVT with RV strain, GI bleed from gastric ulcer.  Past Medical History  Has not seen a doctor.  No past medical history. Smokes 1 pack/day, drinks 2 to 3 40 ounce beers every day.  Smokes marijuana.  Denies any other recreational drug use.  Significant Hospital Events   3/23 Admit, EGD with findings of oozing gastric ulcer with clot, IR for mechanical thrombectomy, IVC filter and GDA embolization 3/24 Started heparin drip  Consults:  Cardiology, GI, interventional radiology, neurolgoy  Procedures:  ETT 3/23 >> L TLC 3/23 >>  ALine L Fem 3/23 >> 3/30  Significant Diagnostic Tests:  CTA 3/23 >> bilateral PE with RV/with ratio 1.9, emphysema CT head 3/23 >> chronic microvascular changes.  No acute findings Echo 3/23 >> RV is severely dilated with reduced function and D-shaped septum, LVEF 60-65%. LE Venous Duplex 3/24 >> acute DVT in BLE up to the femoral vein  MRI Brain 3/28 >> multifocal acute to subacute ischemic infarcts involving bilateral cerebral hemispheres, findings consistent with global hypoperfusion event related to cardiac arrest, associated scattered petechial hemorrhage about many areas of ischemia, evidence of hemorrhagic conversion in the right parietal lobe  Micro Data:  Bcx 3/23 >> negative [name] 3/23 >> negative BCx2 3/23 >> negative   Antimicrobials:    Interim history/subjective:  RN reports pt weaned off levophed this am. TPN off.  Not feeding due to lack of  access with gastric ulcer.  Afebrile / WBC 13.5 Glucose range 84-109 I/O 2.4L UOP, -211 ml in last 24h  Objective   Blood pressure 108/83, pulse 93, temperature 98.4 F (36.9 C), temperature source Bladder, resp. rate 17, height 6\' 2"  (1.88 m), weight 83.6 kg, SpO2 100 %.    Vent Mode: PRVC FiO2 (%):  [40 %-80 %] 80 % Set Rate:  [15 bmp] 15 bmp Vt Set:  [650 mL] 650 mL PEEP:  [5 cmH20] 5 cmH20 Plateau Pressure:  [14 cmH20-16 cmH20] 15 cmH20   Intake/Output Summary (Last 24 hours) at 03/14/2020 1000 Last data filed at 03/14/2020 0800 Gross per 24 hour  Intake 2023.77 ml  Output 2388 ml  Net -364.23 ml   Filed Weights   03/12/20 0500 03/13/20 0451 03/14/20 0153  Weight: 84.8 kg 84.8 kg 83.6 kg    Examination: General: chronically ill appearing adult male lying in bed in NAD  HEENT: MM pink/moist, ETT, drooling, anicteric, pupils 68mm Neuro: sedate on propofol, slight facial grimace with painful stimuli  CV: s1s2 rrr, no m/r/g PULM:  Non-labored on vent, lungs bilaterally clear  GI: soft, bsx4 active  Extremities: warm/dry, BLE pitting 3+ edema, space boots in place Skin: no rashes or lesions  Resolved Hospital Problem list   Hypoglycemia Transaminitis  Assessment & Plan:   Cardiac Arrest secondary to Massive PE Asystole as initial rhythm secondary to massive PE, downtime ~ 20 minutes.  Initial CT head w/o acute abnormality. S/P TTM.  Follow up MRI with hemorrhagic conversion.  -supportive care, appreciate Cardiology input -  wean levophed off for MAP >65  Shock Circulatory shock in setting of cardiac arrest, possible component of obstructive in setting of PE now s/p thrombectomy -resolving, tolerating diuresis  -off vasopressors am 3/30  -follow I/O's   Anoxic Encephalopathy CVA with Hemorrhagic Conversion  MRI with multiple acute and subacute infarcts with evidence of hemorrhagic conversion. Affecting language and speech center of brain. -appreciate Neurology input   -follow serial neuro exams -defer further neuroimaging to Neurology -would continue to hold anticoagulation     Acute Respiratory Failure secondary to Massive PE with Acute Cor Pulmonale Underwent mechanical thrombectomy and IVC filter- yield on the thrombectomy was minimal as clot was adherent and he likely has subacute clot.  Not a candidate for ECMO.  -heparin gtt on hold due to hemorrhagic conversion  -PRVC 8cc/kg  -wean PEEP / FiO2 for sats > 90% -daily SBT as tolerated -follow intermittent CXR  -VAP prevention measures   Acute Upper GI bleed secondary to large Gastric Ulcer Not amenable for endoscopic intervention. Underwent GDA embolization.   -appreciate GI input, signed off 3/25 -follow for further evidence of bleeding  -recommendation for no gastric tube for 2 weeks, ~4/8 can place feeding tube  -if rebleeds, may need consideration for gastrectomy  -PPI  -hold TPN with RV failure to avoid fluid burden of TPN  BLE DVT Up to femoral veins bilaterally, s/p IVC filter.   -can be removed in 3 months if survives acute illness   Best practice:  Diet: TPN  Pain/Anxiety/Delirium protocol (if indicated): PAD protocol  VAP protocol (if indicated): in place DVT prophylaxis: IVC filter / SCD's  GI prophylaxis: Pepcid Glucose control: SSI Mobility: Bed Code Status: Full Code  Family Communication: [name] (niece) called 3/30 for update.  She requests that [name] not have information given to her as she is not family.  [name] indicates difficulties with finances and is arranging the plans for the patient.  She is tearful and indicates she has difficulty traveling here and will come on the same day for withdrawal of care that she visits the [facility].  Will follow up.    Disposition: ICU    CC Time: 36 minutes   Noe Gens, MSN, NP-C [facility] 03/14/2020, 10:03 AM   Please see [email or web address] for pager details.

## 2020-03-14 NOTE — Progress Notes (Signed)
STROKE TEAM PROGRESS NOTE      INTERVAL HISTORY He continues to remain poorly responsive and barely responsive to noxious stimuli and does not open eyes or follow any commands.  Patient condition remains critically ill requiring Levophed for hemodynamic support and propofol for sedation.  He remains on ventilatory support for respiratory failure.  No new interval changes   OBJECTIVE Vitals:   03/14/20 0800 03/14/20 0900 03/14/20 1000 03/14/20 1100  BP: 108/83     Pulse: 93 86 93 (!) 121  Resp: 17 15 15  (!) 23  Temp: 98.4 F (36.9 C) 98.2 F (36.8 C) 98.1 F (36.7 C) 98.6 F (37 C)  TempSrc: Bladder     SpO2: 100% 100% 100% 90%  Weight:      Height:        CBC:  Recent Labs  Lab 03/13/20 0439 03/14/20 0309  WBC 9.9 13.5*  NEUTROABS 8.0* 11.1*  HGB 8.8* 9.2*  HCT 25.7* 27.3*  MCV 95.5 95.5  PLT 159 [id number]    Basic Metabolic Panel:  Recent Labs  Lab 03/12/20 0432 03/12/20 0432 03/13/20 0439 03/14/20 0309  NA 138   < > 142 141  K 4.2   < > 3.4* 4.4  CL 106   < > 105 103  CO2 25   < > 28 27  GLUCOSE 151*   < > 145* 97  BUN 11   < > 14 16  CREATININE 0.62   < > 0.67 0.62  CALCIUM 8.4*   < > 8.3* 8.3*  MG 2.1   < > 1.9 2.3  PHOS 3.2  --  3.5  --    < > = values in this interval not displayed.    Lipid Panel:     Component Value Date/Time   CHOL 79 03/13/2020 0442   TRIG 47 03/13/2020 0442   HDL 39 (L) 03/13/2020 0442   CHOLHDL 2.0 03/13/2020 0442   VLDL 9 03/13/2020 0442   LDLCALC 31 03/13/2020 0442   HgbA1c:  Lab Results  Component Value Date   HGBA1C 5.2 03/07/2020   Urine Drug Screen:     Component Value Date/Time   LABOPIA NONE DETECTED 03/07/2020 0847   COCAINSCRNUR NONE DETECTED 03/07/2020 0847   LABBENZ NONE DETECTED 03/07/2020 0847   AMPHETMU NONE DETECTED 03/07/2020 0847   THCU NONE DETECTED 03/07/2020 0847   LABBARB NONE DETECTED 03/07/2020 0847    Alcohol Level     Component Value Date/Time   ETH <10 03/07/2020 1243     IMAGING   CT HEAD WO CONTRAST  Result Date: 03/14/2020 CLINICAL DATA:  Stroke follow-up EXAM: CT HEAD WITHOUT CONTRAST TECHNIQUE: Contiguous axial images were obtained from the base of the skull through the vertex without intravenous contrast. COMPARISON:  Brain MRI from 2 days ago FINDINGS: Brain: Numerous acute infarcts most confluent in the bilateral cerebellum and occipital lobes. There is hemorrhagic conversion with hematomas in the bilateral occipital parietal region with fluid hematocrit levels best seen on the right by MRI. Hemorrhagic area on axial slices at the area of maximal hemorrhage measures up to 3 cm. Petechial type hemorrhage seen at a left cerebellar and left posterior frontal infarct. No hydrocephalus or shift. Vascular: Negative Skull: Negative Sinuses/Orbits: Nasopharyngeal fluid in the setting of intubation IMPRESSION: Extensive acute infarction with multifocal hemorrhagic conversion that is stable from 03/12/2020. Electronically Signed   By: Monte Fantasia M.D.   On: 03/14/2020 08:02   VAS US CAROTID  Result Date: 03/12/2020  Carotid Arterial Duplex Study Indications:       CVA. Limitations        Today's exam was limited due to patient on a ventilator and                    bandaging on right neck. Comparison Study:  No prior study Performing Technologist: Maudry Mayhew MHA, RDMS, RVT, RDCS  Examination Guidelines: A complete evaluation includes B-mode imaging, spectral Doppler, color Doppler, and power Doppler as needed of all accessible portions of each vessel. Bilateral testing is considered an integral part of a complete examination. Limited examinations for reoccurring indications may be performed as noted.  Right Carotid Findings: +----------+--------+--------+--------+------------------+--------+           PSV cm/sEDV cm/sStenosisPlaque DescriptionComments +----------+--------+--------+--------+------------------+--------+ CCA Distal37      13                                          +----------+--------+--------+--------+------------------+--------+ ICA Prox  97      43                                         +----------+--------+--------+--------+------------------+--------+ ICA Distal64      32                                         +----------+--------+--------+--------+------------------+--------+ ECA       20      5                                          +----------+--------+--------+--------+------------------+--------+ +---------+--------+--+--------+--+---------+ VertebralPSV cm/s36EDV cm/s12Antegrade +---------+--------+--+--------+--+---------+  Left Carotid Findings: +----------+--------+--------+--------+------------------+--------+           PSV cm/sEDV cm/sStenosisPlaque DescriptionComments +----------+--------+--------+--------+------------------+--------+ CCA Prox  68      26                                         +----------+--------+--------+--------+------------------+--------+ CCA Distal54      23                                         +----------+--------+--------+--------+------------------+--------+ ICA Prox  49      25                                         +----------+--------+--------+--------+------------------+--------+ ICA Distal101     53                                         +----------+--------+--------+--------+------------------+--------+ ECA       38      12                                         +----------+--------+--------+--------+------------------+--------+ +----------+--------+--------+----------------+-------------------+  PSV cm/sEDV cm/sDescribe        Arm Pressure (mmHG) +----------+--------+--------+----------------+-------------------+ Subclavian100             Multiphasic, WNL                    +----------+--------+--------+----------------+-------------------+ +---------+--------+--+--------+--+---------+  VertebralPSV cm/s57EDV cm/s24Antegrade +---------+--------+--+--------+--+---------+   Summary: Right Carotid: Velocities in the right ICA are consistent with an upper range                1-39% stenosis. Left Carotid: Velocities in the left ICA are consistent with a 1-39% stenosis. Vertebrals:  Bilateral vertebral arteries demonstrate antegrade flow. Subclavians: Normal flow hemodynamics were seen in bilateral subclavian              arteries. *See table(s) above for measurements and observations.  Electronically signed by Deitra Mayo MD on 03/12/2020 at 6:35:45 PM.    Final      Transthoracic Echocardiogram  03/07/20 IMPRESSIONS  1. Very difficult study with poor windows. The RV appears severely  dilated with severely reduced function. D shaped septum. Overall, concerns  for pulmonary embolism in this patient with cardiac arrest. Clinical  correlation is recommended.  2. Left ventricular ejection fraction, by estimation, is 60 to 65%. The  left ventricle has normal function. Left ventricular endocardial border  not optimally defined to evaluate regional wall motion. Left ventricular  diastolic function could not be  evaluated. There is the interventricular septum is flattened in systole  and diastole, consistent with right ventricular pressure and volume  overload.  3. Right ventricular systolic function is severely reduced. The right  ventricular size is severely enlarged.  4. The mitral valve is grossly normal. No evidence of mitral valve  regurgitation. No evidence of mitral stenosis.  5. The aortic valve is grossly normal. Aortic valve regurgitation is not  visualized. No aortic stenosis is present.  6. Right atrial size was mildly dilated.   Bilateral Carotid Dopplers - will order   ECG - SR rate 99 BPM. (See cardiology reading for complete details)   Overnight EEG  03/08/20 IMPRESSION: This study issuggestive of moderate to severe diffuse encephalopathy,  nonspecific etiology but could be secondary to sedation.No seizures or epileptiform discharges were seen throughout the recording. EEG appeared to be improving throughout the study.    PHYSICAL EXAM Blood pressure 108/83, pulse (!) 121, temperature 98.6 F (37 C), resp. rate (!) 23, height 6\' 2"  (1.88 m), weight 83.6 kg, SpO2 90 %. Frail middle-age male who is sedated and intubated  Neurological Exam  : patient is sedated intubated and on mechanical ventilation for respiratory failure.  Eyes are closed.  Pupils 3 mm sluggishly reactive.  Corneal reflexes are barely elicitable.  Doll's eye movements absent.  Tonic downgaze eye deviation.  Only slight withdrawal in the left upper and lower extremity to sternal rub.  Trace withdrawal in the right lower extremity to painful stimuli none in the right upper extremity.  Both plantars are equivocal.          ASSESSMENT/PLAN [name] is a 64 y.o. male with history of  alcohol abuse and tobacco abuse admitted on 3/23 after cardiac arrest and massive PE - approximately 20 minutes. 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 Sequelae of PRES would be the primary differential consideration.  Resultant  coma  Code  Stroke CT Head - not ordered  CT head - not ordered  MRI head - Multifocal acute to early subacute ischemic infarcts involving the bilateral cerebral and cerebellar hemispheres as above. Findings felt to be most consistent with a global hypoperfusion event related to recent cardiac arrest. Sequelae of PRES would be the primary differential consideration. Associated scattered petechial hemorrhage about many of these areas of ischemia. Evidence for hemorrhagic transformation at the right parietal lobe with 3.6 x 3.0 x 5.3 cm cortically based intraparenchymal hematoma.  Additional smaller 11 mm hematoma at the left occipital pole. Associated mild localized edema without significant regional mass effect or midline shift. Generalized age-related cerebral atrophy.   MRA head - not ordered  CTA H&N - not ordered  CT Perfusion - not ordered  EEG - This study issuggestive of moderate to severe diffuse encephalopathy, nonspecific etiology but could be secondary to sedation.No seizures or epileptiform discharges were seen throughout the recording. EEG appeared to be improving throughout the study.   Carotid Doppler -bilateral 1-39% carotid stenosis  2D Echo - EF 60 - 65%. No cardiac source of emboli identified.   Sars Corona Virus 2 - negative  LDL -31 mg percent HgbA1c - 5.2  UDS - negative  VTE prophylaxis - SCDs Diet  Diet Order            Diet NPO time specified  Diet effective now              No antithrombotic prior to admission, now on No antithrombotic  Patient will counseled to be compliant with his antithrombotic medications  Ongoing aggressive stroke risk factor management  Therapy recommendations:  pending  Disposition:  Pending  Hypertension  Home BP meds: none   Current BP meds: Levophed  BP low . Permissive hypertension (OK if < 220/120) but gradually normalize in 5-7 days  . Long-term BP goal normotensive  Hyperlipidemia  Home Lipid lowering medication: none   LDL pending, goal < 70  Current lipid lowering medication: none   Continue statin at discharge  Other Stroke Risk Factors  Advanced age  Cigarette smoker - advised to stop smoking  Other Active Problems  Code status - Full code  Anemia - Hb - 9.1  Hospital day # 7 Patient is critically ill with extensive neurological brain damage from hypoxic injury from his cardiac arrest from pulmonary embolism as well as multiple by cerebral infarcts likely from paradoxical embolism or from cardiac arrhythmia.  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  I spoke to the patient's niece yesterday over the phone who is his next of kin over the phone and explained the poor prognosis and she understood and is willing to withdraw ventilatory support but would like to wait till Friday this week in order to take care of the arrangements.  I explained to her that patient is so sick that he could possibly pass away when sooner and she understood this.  Discussed with Dr. Baltazar Apo critical care medicine. This patient is critically ill and at significant risk of neurological worsening, death and care requires constant monitoring of vital signs, hemodynamics,respiratory and cardiac monitoring, extensive review of multiple databases, frequent neurological assessment, discussion with family, other specialists and medical decision making of high complexity.I have made any additions or clarifications directly to the above note.This critical care  time does not reflect procedure time, or teaching time or supervisory time of PA/NP/Med Resident etc but could involve care discussion time.  I spent 30 minutes of neurocritical care time  in the care of  this patient.   Antony Contras, MD     To contact Stroke Continuity provider, please refer to [email or web address] After hours, contact General Neurology

## 2020-03-14 NOTE — Progress Notes (Signed)
eLink Physician-Brief Progress Note Patient Name: Kenneth Sullivan DOB: Oct 08, 1956 MRN: [id number]   Date of Service  03/14/2020  HPI/Events of Note  Request for AM lab orders.  eICU Interventions  Will order: 1. CBC with platelets, BMP and Mg++ level in AM.     Intervention Category Major Interventions: Other:  Elsbeth Yearick Cornelia Copa 03/14/2020, 12:01 AM

## 2020-03-15 DIAGNOSIS — I469 Cardiac arrest, cause unspecified: Secondary | ICD-10-CM | POA: Diagnosis not present

## 2020-03-15 DIAGNOSIS — G931 Anoxic brain damage, not elsewhere classified: Secondary | ICD-10-CM | POA: Diagnosis not present

## 2020-03-15 DIAGNOSIS — J9601 Acute respiratory failure with hypoxia: Secondary | ICD-10-CM | POA: Diagnosis not present

## 2020-03-15 LAB — CBC
HCT: 29.6 % — ABNORMAL LOW (ref 39.0–52.0)
Hemoglobin: 9.8 g/dL — ABNORMAL LOW (ref 13.0–17.0)
MCH: 32.2 pg (ref 26.0–34.0)
MCHC: 33.1 g/dL (ref 30.0–36.0)
MCV: 97.4 fL (ref 80.0–100.0)
Platelets: 235 10*3/uL (ref 150–400)
RBC: 3.04 MIL/uL — ABNORMAL LOW (ref 4.22–5.81)
RDW: 16.9 % — ABNORMAL HIGH (ref 11.5–15.5)
WBC: 15 10*3/uL — ABNORMAL HIGH (ref 4.0–10.5)
nRBC: 0 % (ref 0.0–0.2)

## 2020-03-15 LAB — BASIC METABOLIC PANEL
Anion gap: 12 (ref 5–15)
BUN: 22 mg/dL (ref 8–23)
CO2: 29 mmol/L (ref 22–32)
Calcium: 8.8 mg/dL — ABNORMAL LOW (ref 8.9–10.3)
Chloride: 100 mmol/L (ref 98–111)
Creatinine, Ser: 0.87 mg/dL (ref 0.61–1.24)
GFR calc Af Amer: >60 mL/min (ref >60)
GFR calc non Af Amer: >60 mL/min (ref >60)
Glucose, Bld: 89 mg/dL (ref 70–99)
Potassium: 3.8 mmol/L (ref 3.5–5.1)
Sodium: 141 mmol/L (ref 135–145)

## 2020-03-15 LAB — GLUCOSE, CAPILLARY
Glucose-Capillary: 78 mg/dL (ref 70–99)
Glucose-Capillary: 83 mg/dL (ref 70–99)
Glucose-Capillary: 86 mg/dL (ref 70–99)
Glucose-Capillary: 88 mg/dL (ref 70–99)
Glucose-Capillary: 89 mg/dL (ref 70–99)

## 2020-03-15 LAB — TRIGLYCERIDES: Triglycerides: 197 mg/dL — ABNORMAL HIGH (ref <150)

## 2020-03-15 MED ORDER — FENTANYL 2500MCG IN NS 250ML (10MCG/ML) PREMIX INFUSION
25.0000 ug/h | INTRAVENOUS | Status: DC
  Administered 2020-03-15 – 2020-03-16 (×2): 100 ug/h via INTRAVENOUS
  Administered 2020-03-17: 23:00:00 150 ug/h via INTRAVENOUS
  Filled 2020-03-15 (×4): qty 250

## 2020-03-15 MED ORDER — FENTANYL BOLUS VIA INFUSION
25.0000 ug | INTRAVENOUS | Status: DC | PRN
  Administered 2020-03-17 (×2): 25 ug via INTRAVENOUS
  Filled 2020-03-15: qty 25

## 2020-03-15 MED ORDER — SODIUM CHLORIDE 0.9% FLUSH
10.0000 mL | INTRAVENOUS | Status: DC | PRN
  Administered 2020-04-15 – 2020-05-24 (×4): 10 mL

## 2020-03-15 MED ORDER — FENTANYL CITRATE (PF) 100 MCG/2ML IJ SOLN: 25.0000 ug | Freq: Once | INTRAMUSCULAR | Status: DC

## 2020-03-15 MED ORDER — SODIUM CHLORIDE 0.9% FLUSH
10.0000 mL | Freq: Two times a day (BID) | INTRAVENOUS | Status: DC
  Administered 2020-03-15 – 2020-03-17 (×4): 10 mL
  Administered 2020-03-17: 20 mL
  Administered 2020-03-18 – 2020-03-22 (×8): 10 mL
  Administered 2020-03-23: 20 mL
  Administered 2020-03-24 – 2020-04-11 (×20): 10 mL

## 2020-03-15 NOTE — Progress Notes (Signed)
NAME:  Kenneth Sullivan, MRN:  [id number], DOB:  24-Apr-1956, LOS: 8 ADMISSION DATE:  03/07/2020, CONSULTATION DATE:  03/07/20 REFERRING MD:  Dr Roxanne Mins MD, CHIEF COMPLAINT:  Cardiac arrest  Brief History   64 year old with no significant past medical history.  Had acute shortness of breath with sats in the 40s on EMS arrival.  He had a witnessed bradycardic, asystolic arrest when EMS was present with 3 rounds of epi, 20 minutes to ROSC. Underwent normothermia protocol.  Work-up significant for massive PE, DVT with RV strain, GI bleed from gastric ulcer.  Past Medical History  Has not seen a doctor.  No past medical history. Smokes 1 pack/day, drinks 2 to 3 40 ounce beers every day.  Smokes marijuana.  Denies any other recreational drug use.  Significant Hospital Events   3/23 Admit, EGD with findings of oozing gastric ulcer with clot, IR for mechanical thrombectomy, IVC filter and GDA embolization 3/24 Started heparin drip  Consults:  Cardiology, GI, interventional radiology, neurolgoy  Procedures:  ETT 3/23 >> L TLC 3/23 >>  ALine L Fem 3/23 >> 3/30  Significant Diagnostic Tests:  CTA 3/23 >> bilateral PE with RV/with ratio 1.9, emphysema CT head 3/23 >> chronic microvascular changes.  No acute findings Echo 3/23 >> RV is severely dilated with reduced function and D-shaped septum, LVEF 60-65%. LE Venous Duplex 3/24 >> acute DVT in BLE up to the femoral vein  MRI Brain 3/28 >> multifocal acute to subacute ischemic infarcts involving bilateral cerebral hemispheres, findings consistent with global hypoperfusion event related to cardiac arrest, associated scattered petechial hemorrhage about many areas of ischemia, evidence of hemorrhagic conversion in the right parietal lobe  Micro Data:  Bcx 3/23 >> negative [name] 3/23 >> negative BCx2 3/23 >> negative   Antimicrobials:    Interim history/subjective:  Remains off norepinephrine Propofol has ranged from 20-50, periods of tachypnea  and vent alarming No significant change in neurologic exam when propofol is weaned I/O +15 L total  Objective   Blood pressure 115/71, pulse (!) 110, temperature 98.6 F (37 C), resp. rate (!) 27, height 6\' 2"  (1.88 m), weight 85 kg, SpO2 95 %.    Vent Mode: PRVC FiO2 (%):  [40 %-50 %] 50 % Set Rate:  [15 bmp] 15 bmp Vt Set:  [650 mL] 650 mL PEEP:  [5 cmH20] 5 cmH20 Pressure Support:  [15 cmH20] 15 cmH20 Plateau Pressure:  [16 cmH20-21 cmH20] 16 cmH20   Intake/Output Summary (Last 24 hours) at 03/15/2020 1028 Last data filed at 03/15/2020 0900 Gross per 24 hour  Intake 315.57 ml  Output 1085 ml  Net -769.43 ml   Filed Weights   03/13/20 0451 03/14/20 0153 03/15/20 0450  Weight: 84.8 kg 83.6 kg 85 kg    Examination: General: [name], in some moderate respiratory distress on MV HEENT: Pupils 3 mm, sluggish, ET tube in place, no oral lesions Neuro: Propofol 20, no significant responses, does not follow commands, does not open eyes, possible grimace and some extensor posturing with pain CV: Regular, borderline tachycardic, distant, no murmur PULM: Using accessory muscles to breathe, double stacking breaths MV with a prolonged inspiratory phase, no wheezing, no crackles GI: Nondistended, positive bowel sounds Extremities: 2+ bilateral lower extremity edema, foot drop boots in place Skin: No rash  Resolved Hospital Problem list   Hypoglycemia Transaminitis  Assessment & Plan:   Cardiac Arrest secondary to Massive PE Asystole as initial rhythm secondary to massive PE, downtime ~ 20 minutes.  Initial CT head w/o acute abnormality. S/P TTM.  Follow up MRI with hemorrhagic conversion.  Heparin discontinued initially due to blood loss and then due to Hemorrhagic conversion of his CVA Norepinephrine weaned to off IVC filter in place  Shock Circulatory shock in setting of cardiac arrest, possible component of obstructive in setting of PE now s/p  thrombectomy Pressors off Tolerating low-dose Lasix 40 mg daily Following I/O, +15 L for the hospitalization  Anoxic Encephalopathy CVA with Hemorrhagic Conversion  MRI with multiple acute and subacute infarcts with evidence of hemorrhagic conversion. Affecting language and speech center of brain. Appreciate neurology assistance.  Prognosis poor for any meaningful neurological improvement. Continue to discuss prognosis, goals for care with family  Acute Respiratory Failure secondary to Massive PE with Acute Cor Pulmonale Underwent mechanical thrombectomy and IVC filter- yield on the thrombectomy was minimal as clot was adherent and he likely has subacute clot.    Heparin infusion held due to hemorrhagic conversion as above Continue PRVC 8 cc/kg, allow PSV as he can tolerate.  He is tachypneic and double stacks breaths when he is off sedation VAP prevention order set Follow intermittent chest x-ray  Acute Upper GI bleed secondary to large Gastric Ulcer Not amenable for endoscopic intervention. Underwent GDA embolization.  Appears to have achieve hemostasis, currently off heparin No gastric tube recommended for at least 2 weeks.  Poor candidate for any surgical intervention should he rebleed PPI as ordered Defer TPN to avoid volume overload and in light of his overall neurological prognosis  BLE DVT Up to femoral veins bilaterally, s/p IVC filter.   No intervention at this time IVC filter in place  Best practice:  Diet: TPN  Pain/Anxiety/Delirium protocol (if indicated): PAD protocol  VAP protocol (if indicated): in place DVT prophylaxis: IVC filter / SCD's  GI prophylaxis: Pepcid Glucose control: SSI Mobility: Bed Code Status: Full Code  Family Communication: Spoke with the patient's niece [name] who is his main family contact on 3/31.  She is planning to withdraw care on 03/25/23.  In the interim I recommended that he the patient be DNR in the event that he arrest.  She does not  want to change his CODE STATUS because she is concerned about the patient's finances and her ability to pay for his cremation, burial, memorial.  She will have access to the patient's next check on Friday, states that she cannot use this money if he dies before 03/25/23.  Disposition: ICU    CC Time: 34 minutes   Baltazar Apo, MD, PhD 03/15/2020, 11:02 AM [facility] [phone] or if no answer [phone]

## 2020-03-15 NOTE — Progress Notes (Signed)
STROKE TEAM PROGRESS NOTE      INTERVAL HISTORY He became agitated earlier today requiring heavy sedation and is now on propofol and remains poorly responsive and barely responsive to noxious stimuli and does not open eyes or follow any commands.  Patient condition remains critically ill and Levophed has now been tapered off.  He remains on ventilatory support for respiratory failure.  No new interval changes   OBJECTIVE Vitals:   03/15/20 1230 03/15/20 1245 03/15/20 1300 03/15/20 1315  BP: (!) 74/58 (!) 85/68 (!) 81/72 99/77  Pulse: 91 93 (!) 111 (!) 113  Resp: 15 15 14  (!) 23  Temp: 98.8 F (37.1 C) 98.8 F (37.1 C) 99 F (37.2 C) 99.1 F (37.3 C)  TempSrc:      SpO2: 98% 99% 97% 98%  Weight:      Height:        CBC:  Recent Labs  Lab 03/13/20 0439 03/13/20 0439 03/14/20 0309 03/15/20 0451  WBC 9.9   < > 13.5* 15.0*  NEUTROABS 8.0*  --  11.1*  --   HGB 8.8*   < > 9.2* 9.8*  HCT 25.7*   < > 27.3* 29.6*  MCV 95.5   < > 95.5 97.4  PLT 159   < > 187 235   < > = values in this interval not displayed.    Basic Metabolic Panel:  Recent Labs  Lab 03/12/20 0432 03/12/20 0432 03/13/20 0439 03/13/20 0439 03/14/20 0309 03/15/20 0451  NA 138   < > 142   < > 141 141  K 4.2   < > 3.4*   < > 4.4 3.8  CL 106   < > 105   < > 103 100  CO2 25   < > 28   < > 27 29  GLUCOSE 151*   < > 145*   < > 97 89  BUN 11   < > 14   < > 16 22  CREATININE 0.62   < > 0.67   < > 0.62 0.87  CALCIUM 8.4*   < > 8.3*   < > 8.3* 8.8*  MG 2.1   < > 1.9  --  2.3  --   PHOS 3.2  --  3.5  --   --   --    < > = values in this interval not displayed.    Lipid Panel:     Component Value Date/Time   CHOL 79 03/13/2020 0442   TRIG 197 (H) 03/15/2020 0451   HDL 39 (L) 03/13/2020 0442   CHOLHDL 2.0 03/13/2020 0442   VLDL 9 03/13/2020 0442   LDLCALC 31 03/13/2020 0442   HgbA1c:  Lab Results  Component Value Date   HGBA1C 5.2 03/07/2020   Urine Drug Screen:     Component Value Date/Time    LABOPIA NONE DETECTED 03/07/2020 0847   COCAINSCRNUR NONE DETECTED 03/07/2020 0847   LABBENZ NONE DETECTED 03/07/2020 0847   AMPHETMU NONE DETECTED 03/07/2020 0847   THCU NONE DETECTED 03/07/2020 0847   LABBARB NONE DETECTED 03/07/2020 0847    Alcohol Level     Component Value Date/Time   ETH <10 03/07/2020 1243    IMAGING   CT HEAD WO CONTRAST  Result Date: 03/14/2020 CLINICAL DATA:  Stroke follow-up EXAM: CT HEAD WITHOUT CONTRAST TECHNIQUE: Contiguous axial images were obtained from the base of the skull through the vertex without intravenous contrast. COMPARISON:  Brain MRI from 2 days ago FINDINGS: Brain:  Numerous acute infarcts most confluent in the bilateral cerebellum and occipital lobes. There is hemorrhagic conversion with hematomas in the bilateral occipital parietal region with fluid hematocrit levels best seen on the right by MRI. Hemorrhagic area on axial slices at the area of maximal hemorrhage measures up to 3 cm. Petechial type hemorrhage seen at a left cerebellar and left posterior frontal infarct. No hydrocephalus or shift. Vascular: Negative Skull: Negative Sinuses/Orbits: Nasopharyngeal fluid in the setting of intubation IMPRESSION: Extensive acute infarction with multifocal hemorrhagic conversion that is stable from 03/12/2020. Electronically Signed   By: Monte Fantasia M.D.   On: 03/14/2020 08:02     Transthoracic Echocardiogram  03/07/20 IMPRESSIONS  1. Very difficult study with poor windows. The RV appears severely  dilated with severely reduced function. D shaped septum. Overall, concerns  for pulmonary embolism in this patient with cardiac arrest. Clinical  correlation is recommended.  2. Left ventricular ejection fraction, by estimation, is 60 to 65%. The  left ventricle has normal function. Left ventricular endocardial border  not optimally defined to evaluate regional wall motion. Left ventricular  diastolic function could not be  evaluated. There is  the interventricular septum is flattened in systole  and diastole, consistent with right ventricular pressure and volume  overload.  3. Right ventricular systolic function is severely reduced. The right  ventricular size is severely enlarged.  4. The mitral valve is grossly normal. No evidence of mitral valve  regurgitation. No evidence of mitral stenosis.  5. The aortic valve is grossly normal. Aortic valve regurgitation is not  visualized. No aortic stenosis is present.  6. Right atrial size was mildly dilated.   Bilateral Carotid Dopplers - will order   ECG - SR rate 99 BPM. (See cardiology reading for complete details)   Overnight EEG  03/08/20 IMPRESSION: This study issuggestive of moderate to severe diffuse encephalopathy, nonspecific etiology but could be secondary to sedation.No seizures or epileptiform discharges were seen throughout the recording. EEG appeared to be improving throughout the study.    PHYSICAL EXAM Blood pressure 99/77, pulse (!) 113, temperature 99.1 F (37.3 C), resp. rate (!) 23, height 6\' 2"  (1.88 m), weight 85 kg, SpO2 98 %. Frail middle-age male who is sedated and intubated  Neurological Exam  : patient is sedated intubated and on mechanical ventilation for respiratory failure.  Eyes are closed.  Pupils 3 mm sluggishly reactive.  Corneal reflexes are barely elicitable.  Doll's eye movements absent.  Tonic downgaze eye deviation.  Only slight withdrawal in the left upper and lower extremity to sternal rub.  Trace withdrawal in the right lower extremity to painful stimuli none in the right upper extremity.  Both plantars are equivocal.          ASSESSMENT/PLAN Mr. Jullian Kazlauskas is a 64 y.o. male with history of  alcohol abuse and tobacco abuse admitted on 3/23 after cardiac arrest and massive PE - approximately 20 minutes. He did not receive IV t-PA due to late presentation (>4.5 hours from time of onset)  Stroke:  multifocal acute to early  subacute ischemic infarcts involving the bilateral cerebral and cerebellar hemispheres. Sequelae of PRES would be the primary differential consideration.  Resultant  coma  Code Stroke CT Head - not ordered  CT head - not ordered  MRI head - Multifocal acute to early subacute ischemic infarcts involving the bilateral cerebral and cerebellar hemispheres as above. Findings felt to be most consistent with a global hypoperfusion event related to recent cardiac arrest.  Sequelae of PRES would be the primary differential consideration. Associated scattered petechial hemorrhage about many of these areas of ischemia. Evidence for hemorrhagic transformation at the right parietal lobe with 3.6 x 3.0 x 5.3 cm cortically based intraparenchymal hematoma. Additional smaller 11 mm hematoma at the left occipital pole. Associated mild localized edema without significant regional mass effect or midline shift. Generalized age-related cerebral atrophy.   MRA head - not ordered  CTA H&N - not ordered  CT Perfusion - not ordered  EEG - This study issuggestive of moderate to severe diffuse encephalopathy, nonspecific etiology but could be secondary to sedation.No seizures or epileptiform discharges were seen throughout the recording. EEG appeared to be improving throughout the study.   Carotid Doppler -bilateral 1-39% carotid stenosis  2D Echo - EF 60 - 65%. No cardiac source of emboli identified.   Sars Corona Virus 2 - negative  LDL -31 mg percent HgbA1c - 5.2  UDS - negative  VTE prophylaxis - SCDs Diet  Diet Order            Diet NPO time specified  Diet effective now              No antithrombotic prior to admission, now on No antithrombotic  Patient will counseled to be compliant with his antithrombotic medications  Ongoing aggressive stroke risk factor management  Therapy recommendations:  pending  Disposition:  Pending  Hypertension  Home BP meds: none   Current BP meds:  Levophed  BP low . Permissive hypertension (OK if < 220/120) but gradually normalize in 5-7 days  . Long-term BP goal normotensive  Hyperlipidemia  Home Lipid lowering medication: none   LDL 31 mg%, goal < 70  Current lipid lowering medication: none   Continue statin at discharge  Other Stroke Risk Factors  Advanced age  Cigarette smoker - advised to stop smoking  Other Active Problems  Code status - Full code  Anemia - Hb - 9.1  Hospital day # 8 Patient is critically ill with extensive neurological brain damage from hypoxic injury from his cardiac arrest from pulmonary embolism as well as multiple by cerebral infarcts likely from paradoxical embolism or from cardiac arrhythmia.  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  I spoke to the patient's niece yesterday over the phone who is his next of kin over the phone and explained the poor prognosis and she understood and is willing to withdraw ventilatory support but would like to wait till Friday this week in order to take care of the arrangements.  I explained to her that patient is so sick that he could possibly pass away when sooner and she understood this.  Discussed with Dr. Baltazar Apo critical care medicine. This patient is critically ill and at significant risk of neurological worsening, death and care requires constant monitoring of vital signs, hemodynamics,respiratory and cardiac monitoring, extensive review of multiple databases, frequent neurological assessment, discussion with family, other specialists and medical decision making of high complexity.I have made any additions or clarifications directly to the above note.This critical care time does not reflect procedure time, or teaching time or supervisory time of PA/NP/Med Resident etc but could involve care  discussion time.  I spent 30 minutes of neurocritical care time  in the care of  this patient.   Antony Contras, MD  To contact Stroke Continuity provider, please refer to [email or web address] After hours, contact General Neurology

## 2020-03-16 DIAGNOSIS — J9601 Acute respiratory failure with hypoxia: Secondary | ICD-10-CM | POA: Diagnosis not present

## 2020-03-16 DIAGNOSIS — G931 Anoxic brain damage, not elsewhere classified: Secondary | ICD-10-CM | POA: Diagnosis not present

## 2020-03-16 DIAGNOSIS — I469 Cardiac arrest, cause unspecified: Secondary | ICD-10-CM | POA: Diagnosis not present

## 2020-03-16 DIAGNOSIS — I63429 Cerebral infarction due to embolism of unspecified anterior cerebral artery: Secondary | ICD-10-CM | POA: Diagnosis not present

## 2020-03-16 LAB — GLUCOSE, CAPILLARY
Glucose-Capillary: 68 mg/dL — ABNORMAL LOW (ref 70–99)
Glucose-Capillary: 71 mg/dL (ref 70–99)
Glucose-Capillary: 75 mg/dL (ref 70–99)
Glucose-Capillary: 76 mg/dL (ref 70–99)
Glucose-Capillary: 79 mg/dL (ref 70–99)
Glucose-Capillary: 81 mg/dL (ref 70–99)

## 2020-03-16 NOTE — Progress Notes (Signed)
NAME:  Kenneth Sullivan, MRN:  [id number], DOB:  Apr 18, 1956, LOS: 9 ADMISSION DATE:  03/07/2020, CONSULTATION DATE:  03/07/20 REFERRING MD:  Dr Roxanne Mins MD, CHIEF COMPLAINT:  Cardiac arrest  Brief History   64 year old with no significant past medical history.  Had acute shortness of breath with sats in the 40s on EMS arrival.  He had a witnessed bradycardic, asystolic arrest when EMS was present with 3 rounds of epi, 20 minutes to ROSC. Underwent normothermia protocol.  Work-up significant for massive PE, DVT with RV strain, GI bleed from gastric ulcer.  Past Medical History  Has not seen a doctor.  No past medical history. Smokes 1 pack/day, drinks 2 to 3 40 ounce beers every day.  Smokes marijuana.  Denies any other recreational drug use.  Significant Hospital Events   3/23 Admit, EGD with findings of oozing gastric ulcer with clot, IR for mechanical thrombectomy, IVC filter and GDA embolization 3/24 Started heparin drip  Consults:  Cardiology, GI, interventional radiology, neurolgoy  Procedures:  ETT 3/23 >> L TLC 3/23 >>  ALine L Fem 3/23 >> 3/30  Significant Diagnostic Tests:  CTA 3/23 >> bilateral PE with RV/with ratio 1.9, emphysema CT head 3/23 >> chronic microvascular changes.  No acute findings Echo 3/23 >> RV is severely dilated with reduced function and D-shaped septum, LVEF 60-65%. LE Venous Duplex 3/24 >> acute DVT in BLE up to the femoral vein  MRI Brain 3/28 >> multifocal acute to subacute ischemic infarcts involving bilateral cerebral hemispheres, findings consistent with global hypoperfusion event related to cardiac arrest, associated scattered petechial hemorrhage about many areas of ischemia, evidence of hemorrhagic conversion in the right parietal lobe  Micro Data:  Bcx 3/23 >> negative [name] 3/23 >> negative BCx2 3/23 >> negative   Antimicrobials:    Interim history/subjective:  No significant changes noted overnight He is on propofol, was temporarily on  fentanyl, now off  Objective   Blood pressure 92/69, pulse 75, temperature 98.4 F (36.9 C), resp. rate 15, height 6\' 2"  (1.88 m), weight 85.3 kg, SpO2 99 %.    Vent Mode: PRVC FiO2 (%):  [50 %] 50 % Set Rate:  [15 bmp] 15 bmp Vt Set:  [650 mL] 650 mL PEEP:  [5 cmH20] 5 cmH20 Plateau Pressure:  [15 cmH20-16 cmH20] 16 cmH20   Intake/Output Summary (Last 24 hours) at 03/16/2020 1025 Last data filed at 03/16/2020 1000 Gross per 24 hour  Intake 231.44 ml  Output 890 ml  Net -658.56 ml   Filed Weights   03/14/20 0153 03/15/20 0450 03/16/20 0426  Weight: 83.6 kg 85 kg 85.3 kg    Examination: General: [name], no respiratory distress on MV HEENT: Pupils 3 mm, sluggish, ET tube in place, no oral lesions Neuro: Does not wake to voice, does not open eyes or respond to pain CV: Regular, distant, no murmur PULM: Smoother respiratory pattern, not using accessory muscles (improved), no wheeze or crackles GI: Nondistended with positive bowel sounds Extremities: 1+ lower extremity edema, foot drop boots in place Skin: No rash  Resolved Hospital Problem list   Hypoglycemia Transaminitis  Assessment & Plan:   Cardiac Arrest secondary to Massive PE Asystole as initial rhythm secondary to massive PE, downtime ~ 20 minutes.  Initial CT head w/o acute abnormality. S/P TTM.  Follow up MRI with hemorrhagic conversion.  Heparin discontinued initially due to blood loss and then due to Hemorrhagic conversion of his CVA >> will not restart Norepinephrine weaned to off  IVC filter in place  Shock, resolved Circulatory shock in setting of cardiac arrest, possible component of obstructive in setting of PE, s/p thrombectomy Pressors off Tolerating low-dose Lasix 40 mg daily Following I/O  Anoxic Encephalopathy CVA with Hemorrhagic Conversion  MRI with multiple acute and subacute infarcts with evidence of hemorrhagic conversion. Affecting language and speech center of brain. Poor  prognosis for meaningful neurological recovery.  Discussed with neurology on 3/31 Have discussed poor prognosis with family.  They understand and are planning for transition to comfort care on 04/09/2023  Acute Respiratory Failure secondary to Massive PE with Acute Cor Pulmonale Underwent mechanical thrombectomy and IVC filter- yield on the thrombectomy was minimal as clot was adherent and he likely has subacute clot.    Heparin infusion held due to hemorrhagic conversion as above, continue to hold Continue PRBC 8 cc/kg.  Okay to allow PSV if this is more comfortable mode but no plans for an extubation until transition to full comfort care VAP prevention order set  Acute Upper GI bleed secondary to large Gastric Ulcer Not amenable for endoscopic intervention. Underwent GDA embolization.  Appears to have achieve hemostasis, currently off heparin No gastric tube recommended for at least 2 weeks.  Poor candidate for any surgical intervention should he rebleed PPI as ordered TPN discontinued in light of his overall neurologic prognosis  BLE DVT Up to femoral veins bilaterally, s/p IVC filter.   No intervention at this time IVC filter in place  Best practice:  Diet: TPN  Pain/Anxiety/Delirium protocol (if indicated): PAD protocol  VAP protocol (if indicated): in place DVT prophylaxis: IVC filter / SCD's  GI prophylaxis: Pepcid Glucose control: SSI Mobility: Bed Code Status: Full Code  Family Communication: Spoke with the patient's niece [name] who is his main family contact on 3/31.  She is planning to withdraw care on 04/09/2023.  In the interim I recommended that he the patient be DNR in the event that he arrest.  She does not want to change his CODE STATUS because she is concerned about the patient's finances and her ability to pay for his cremation, burial, memorial.  She will have access to the patient's next check on Friday, states that she cannot use this money if he dies before  2023-04-09.  Disposition: ICU    CC Time: N/a   Baltazar Apo, MD, PhD 03/16/2020, 10:25 AM [facility] [phone] or if no answer [phone]

## 2020-03-16 NOTE — Progress Notes (Signed)
Family at bedside tearful and upset asking for doctor, Dr Lamonte Sakai notified at [place] that family was here.   I went in to speak to the family and they were all very agitated and upset, saying pt was alert and moving and knew they were there. Family was saying that they were told pt was brain dead.   I again notified at 11:30am Dr. Lamonte Sakai family wished to speak to him and they may need clarification on clinical picture.  I went in to speak with family again they were still very worried about "pulling the plug" and crying and yelling. I listened and explained calmly what I knew from my perspective as the nurse and that I would still like them to listen to what the doctor had to say and for them to tell him what they want.

## 2020-03-16 NOTE — Progress Notes (Signed)
STROKE TEAM PROGRESS NOTE      INTERVAL HISTORY He   remains on ventilatory support for respiratory failure.  He remains sedated on propofol.  Fentanyl was temporarily started and now off.  He has some spontaneous movements of his face and neck as well as lower extremities but not purposeful and does not follow commands.  His niece Kenneth Sullivan and her daughter at the bedside  OBJECTIVE Vitals:   03/16/20 1200 03/16/20 1300 03/16/20 1306 03/16/20 1400  BP: 94/68 92/64 92/64  (!) 87/62  Pulse: 95 95 91 68  Resp: 15 15 15 15   Temp: 98.8 F (37.1 C) 98.8 F (37.1 C)  98.2 F (36.8 C)  TempSrc:      SpO2: 97% 97% 97% 99%  Weight:      Height:        CBC:  Recent Labs  Lab 03/13/20 0439 03/13/20 0439 03/14/20 0309 03/15/20 0451  WBC 9.9   < > 13.5* 15.0*  NEUTROABS 8.0*  --  11.1*  --   HGB 8.8*   < > 9.2* 9.8*  HCT 25.7*   < > 27.3* 29.6*  MCV 95.5   < > 95.5 97.4  PLT 159   < > 187 235   < > = values in this interval not displayed.    Basic Metabolic Panel:  Recent Labs  Lab 03/12/20 0432 03/12/20 0432 03/13/20 0439 03/13/20 0439 03/14/20 0309 03/15/20 0451  NA 138   < > 142   < > 141 141  K 4.2   < > 3.4*   < > 4.4 3.8  CL 106   < > 105   < > 103 100  CO2 25   < > 28   < > 27 29  GLUCOSE 151*   < > 145*   < > 97 89  BUN 11   < > 14   < > 16 22  CREATININE 0.62   < > 0.67   < > 0.62 0.87  CALCIUM 8.4*   < > 8.3*   < > 8.3* 8.8*  MG 2.1   < > 1.9  --  2.3  --   PHOS 3.2  --  3.5  --   --   --    < > = values in this interval not displayed.    Lipid Panel:     Component Value Date/Time   CHOL 79 03/13/2020 0442   TRIG 197 (H) 03/15/2020 0451   HDL 39 (L) 03/13/2020 0442   CHOLHDL 2.0 03/13/2020 0442   VLDL 9 03/13/2020 0442   LDLCALC 31 03/13/2020 0442   HgbA1c:  Lab Results  Component Value Date   HGBA1C 5.2 03/07/2020   Urine Drug Screen:     Component Value Date/Time   LABOPIA NONE DETECTED 03/07/2020 0847   COCAINSCRNUR NONE DETECTED 03/07/2020  0847   LABBENZ NONE DETECTED 03/07/2020 0847   AMPHETMU NONE DETECTED 03/07/2020 0847   THCU NONE DETECTED 03/07/2020 0847   LABBARB NONE DETECTED 03/07/2020 0847    Alcohol Level     Component Value Date/Time   ETH <10 03/07/2020 1243    IMAGING   No results found.   Transthoracic Echocardiogram  03/07/20 IMPRESSIONS  1. Very difficult study with poor windows. The RV appears severely  dilated with severely reduced function. D shaped septum. Overall, concerns  for pulmonary embolism in this patient with cardiac arrest. Clinical  correlation is recommended.  2. Left ventricular ejection fraction, by estimation,  is 60 to 65%. The  left ventricle has normal function. Left ventricular endocardial border  not optimally defined to evaluate regional wall motion. Left ventricular  diastolic function could not be  evaluated. There is the interventricular septum is flattened in systole  and diastole, consistent with right ventricular pressure and volume  overload.  3. Right ventricular systolic function is severely reduced. The right  ventricular size is severely enlarged.  4. The mitral valve is grossly normal. No evidence of mitral valve  regurgitation. No evidence of mitral stenosis.  5. The aortic valve is grossly normal. Aortic valve regurgitation is not  visualized. No aortic stenosis is present.  6. Right atrial size was mildly dilated.   Bilateral Carotid Dopplers -bilateral 1-39% stenosis  ECG - SR rate 99 BPM. (See cardiology reading for complete details)   Overnight EEG  03/08/20 IMPRESSION: This study issuggestive of moderate to severe diffuse encephalopathy, nonspecific etiology but could be secondary to sedation.No seizures or epileptiform discharges were seen throughout the recording. EEG appeared to be improving throughout the study.    PHYSICAL EXAM Blood pressure (!) 87/62, pulse 68, temperature 98.2 F (36.8 C), resp. rate 15, height 6\' 2"  (1.88  m), weight 85.3 kg, SpO2 99 %. Frail middle-age male who is sedated and intubated  Neurological Exam  : patient is sedated intubated and on mechanical ventilation for respiratory failure.  Eyes are closed.  Pupils 3 mm sluggishly reactive.  Corneal reflexes are barely elicitable.  Doll's eye movements absent.  Tonic downgaze eye deviation.  He has intermittent spontaneous side-to-side movements of his neck as well as flexion movement in the lower extremities.  He will however not do this to command and does not follow any commands.  Only slight withdrawal in the left upper and lower extremity to sternal rub.  Trace withdrawal in the right lower extremity to painful stimuli none in the right upper extremity.  Both plantars are equivocal.          ASSESSMENT/PLAN [name] is a 64 y.o. male with history of  alcohol abuse and tobacco abuse admitted on 3/23 after cardiac arrest and massive PE - approximately 20 minutes. 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 Sequelae of PRES would be the primary differential consideration.  Resultant  coma  Code Stroke CT Head - not ordered  CT head - not ordered  MRI head - Multifocal acute to early subacute ischemic infarcts involving the bilateral cerebral and cerebellar hemispheres as above. Findings felt to be most consistent with a global hypoperfusion event related to recent cardiac arrest. Sequelae of PRES would be the primary differential consideration. Associated scattered petechial hemorrhage about many of these areas of ischemia. Evidence for hemorrhagic transformation at the right parietal lobe with 3.6 x 3.0 x 5.3 cm cortically based intraparenchymal hematoma. Additional smaller 11 mm hematoma at the left occipital pole. Associated mild localized edema without significant  regional mass effect or midline shift. Generalized age-related cerebral atrophy.   MRA head - not ordered  CTA H&N - not ordered  CT Perfusion - not ordered  EEG - This study issuggestive of moderate to severe diffuse encephalopathy, nonspecific etiology but could be secondary to sedation.No seizures or epileptiform discharges were seen throughout the recording. EEG appeared to be improving throughout the study.   Carotid Doppler -bilateral 1-39% carotid stenosis  2D Echo - EF 60 - 65%. No cardiac source of emboli identified.   Sars Corona Virus 2 - negative  LDL -31 mg percent HgbA1c - 5.2  UDS - negative  VTE prophylaxis - SCDs Diet  Diet Order            Diet NPO time specified  Diet effective now              No antithrombotic prior to admission, now on No antithrombotic  Patient will counseled to be compliant with his antithrombotic medications  Ongoing aggressive stroke risk factor management  Therapy recommendations:  pending  Disposition:  Pending  Hypertension  Home BP meds: none   Current BP meds: Levophed  BP low . Permissive hypertension (OK if < 220/120) but gradually normalize in 5-7 days  . Long-term BP goal normotensive  Hyperlipidemia  Home Lipid lowering medication: none   LDL 31 mg%, goal < 70  Current lipid lowering medication: none   Continue statin at discharge  Other Stroke Risk Factors  Advanced age  Cigarette smoker - advised to stop smoking  Other Active Problems  Code status - Full code  Anemia - Hb - 9.1  Hospital day # 9 Patient is critically ill with extensive neurological brain damage from hypoxic injury from his cardiac arrest from pulmonary embolism as well as multiple by cerebral infarcts likely from paradoxical embolism or from cardiac arrhythmia.  It is been almost 2 weeks since incident and his neurological exam is quite poor I do not anticipate significant recovery and is likely to survive in a  vegetative state if life support discontinued.  I spoke to the patient's niece and her daughter today at the bedside and explained the poor prognosis but they seem optimistic given relative responsiveness that they have seen.  I explained to them that these are likely reflects movements which may occur even in a vegetative state but they are not willing to withdraw care and want to support him further and agree with tracheostomy and PEG tube.  They however agreed to DNR.  Discussed with Dr. Baltazar Apo critical care medicine.  Stroke team will sign off.  Kindly call for questions This patient is critically ill and at significant risk of neurological worsening, death and care requires constant monitoring of vital signs, hemodynamics,respiratory and cardiac monitoring, extensive review of multiple databases, frequent neurological assessment, discussion with family, other specialists and medical decision making of high complexity.I have made any additions or clarifications directly to the above note.This critical care time does not reflect procedure time, or teaching time or supervisory time of PA/NP/Med Resident etc but could involve care discussion time.  I spent 30 minutes of neurocritical care time  in the care of  this patient.   Antony Contras, MD     To contact Stroke Continuity provider, please refer to [email or web address] After hours, contact General Neurology

## 2020-03-16 NOTE — Progress Notes (Signed)
PCCM Interval Note  I spoke with the patient's niece [name] and her daughter at bedside.  Reviewed the patient's course, neurological status and neurological prognosis.  They noted surprised that he showed some reflexive movement, appeared to turn his head to them, possibly even respond to their commands.  I explained that on my own serial exams he had not significantly changed over the last several days.  They were also able to briefly review the patient's neurological status with Dr.Sethi with Neurology.  We had initially discussed transition to comfort care today, but based on their observations there is significant concern that this would be premature.  I attempted to support them as they work through this decision.  I did explain that if we believe the patient would want [place] to support for longer in order to assess neurological improvement over time that we could arrange for tracheostomy, PEG, longer term support.  At this point that is what [name] would like for [place] to do.  I will work on arranging the procedures.  Ultimately if he is going to require chronic care they would like for him to be able to move to a facility closer to their home.  This may be possible as we go forward.  I did clarify that we would continue current level of support, that Mr. Niedzwiecki is at risk for new problems, worsening.  If he does decompensate, worsen then we agreed that we would not escalate his care.  [name] agreed that we should change his CODE STATUS to DNR in the event of an arrest.  I will place this in the orders.  Independent critical care time 40 minutes  Baltazar Apo, MD, PhD 03/16/2020, 1:12 PM [facility] [phone] or if no answer [phone]

## 2020-03-16 NOTE — Progress Notes (Signed)
Put patient back on diprovan.  Wasted 254ml fentanyl and witnessed by [employer].

## 2020-03-16 NOTE — Progress Notes (Signed)
Nutrition Brief Note  Chart reviewed. Per MD notes, pt with poor prognosis and plans to withdraw care on 03/17/20 per discussion with pt niece. No further nutrition interventions warranted at this time.  Please re-consult as needed.   Loistine Chance, RD, LDN, [place] Registered Dietitian II Certified Diabetes Care and Education Specialist Please refer to [facility] for RD and/or RD on-call/weekend/after hours pager

## 2020-03-17 ENCOUNTER — Inpatient Hospital Stay (HOSPITAL_COMMUNITY): Payer: Medicaid Other

## 2020-03-17 DIAGNOSIS — J9601 Acute respiratory failure with hypoxia: Secondary | ICD-10-CM | POA: Diagnosis not present

## 2020-03-17 DIAGNOSIS — G931 Anoxic brain damage, not elsewhere classified: Secondary | ICD-10-CM | POA: Diagnosis not present

## 2020-03-17 DIAGNOSIS — I469 Cardiac arrest, cause unspecified: Secondary | ICD-10-CM | POA: Diagnosis not present

## 2020-03-17 LAB — GLUCOSE, CAPILLARY
Glucose-Capillary: 101 mg/dL — ABNORMAL HIGH (ref 70–99)
Glucose-Capillary: 147 mg/dL — ABNORMAL HIGH (ref 70–99)
Glucose-Capillary: 62 mg/dL — ABNORMAL LOW (ref 70–99)
Glucose-Capillary: 67 mg/dL — ABNORMAL LOW (ref 70–99)
Glucose-Capillary: 68 mg/dL — ABNORMAL LOW (ref 70–99)
Glucose-Capillary: 70 mg/dL (ref 70–99)
Glucose-Capillary: 83 mg/dL (ref 70–99)
Glucose-Capillary: 90 mg/dL (ref 70–99)

## 2020-03-17 MED ORDER — ETOMIDATE 2 MG/ML IV SOLN
40.0000 mg | Freq: Once | INTRAVENOUS | Status: AC
  Administered 2020-03-17: 2 mg via INTRAVENOUS
  Filled 2020-03-17: qty 20

## 2020-03-17 MED ORDER — FENTANYL BOLUS VIA INFUSION
100.0000 ug | Freq: Once | INTRAVENOUS | Status: AC
  Administered 2020-03-17: 16:00:00 100 ug via INTRAVENOUS
  Filled 2020-03-17: qty 100

## 2020-03-17 MED ORDER — DEXTROSE 5 % IV SOLN: INTRAVENOUS | Status: DC

## 2020-03-17 MED ORDER — DEXTROSE 50 % IV SOLN
INTRAVENOUS | Status: AC
  Administered 2020-03-17: 12.5 mL via INTRAVENOUS
  Filled 2020-03-17: qty 50

## 2020-03-17 MED ORDER — DEXTROSE 50 % IV SOLN
INTRAVENOUS | Status: AC
  Administered 2020-03-17: 04:00:00 25 mL
  Filled 2020-03-17: qty 50

## 2020-03-17 MED ORDER — NOREPINEPHRINE 4 MG/250ML-% IV SOLN
0.0000 ug/min | INTRAVENOUS | Status: DC
  Administered 2020-03-17: 17:00:00 2 ug/min via INTRAVENOUS
  Filled 2020-03-17: qty 250

## 2020-03-17 MED ORDER — MIDAZOLAM HCL 2 MG/2ML IJ SOLN
5.0000 mg | Freq: Once | INTRAMUSCULAR | Status: AC
  Administered 2020-03-17: 2 mg via INTRAVENOUS
  Filled 2020-03-17: qty 6

## 2020-03-17 MED ORDER — DEXTROSE 50 % IV SOLN: 12.5000 g | INTRAVENOUS | Status: AC

## 2020-03-17 MED ORDER — FENTANYL CITRATE (PF) 100 MCG/2ML IJ SOLN
200.0000 ug | Freq: Once | INTRAMUSCULAR | Status: AC
  Administered 2020-03-17: 16:00:00 100 ug via INTRAVENOUS
  Filled 2020-03-17: qty 4

## 2020-03-17 MED ORDER — VECURONIUM BROMIDE 10 MG IV SOLR
10.0000 mg | Freq: Once | INTRAVENOUS | Status: AC
  Administered 2020-03-17: 2 mg via INTRAVENOUS
  Filled 2020-03-17: qty 10

## 2020-03-17 NOTE — Procedures (Signed)
Procedure: Percutaneous Tracheostomy CPT 31600 Performed by: Dr. Marianna Payment under direction of Dr. Erskine Emery Bronchoscopy Assistant: Richardson Landry Minor.  Indications: Chronic respiratory failure and need for ongoing mechanical ventilation.  Consent: Signed in chart  Preprocedure: Universal protocol was followed for this procedure. Timeout performed. Anterior neck prepped and draped.  Anesthesia: The patient was intubated and sedated prior to the procedure. Additional midazolam, etomidate, fentanyl and vecuronium were given for sedation and paralysis with close attention to vital signs throughout procedure.   Procedure: The patient was placed in the supine position. The anterior neck was prepped and draped in usual sterile fashion. 1% lidocaine was administered approximately 2 fingerbreadths above the sternal notch for local anesthesia. A 1.5-cm vertical incision was then performed 2 fingerbreadths above the sternal notch. Using a curved Kelly, blunt dissection was performed down to the level of the pretracheal fascia. At this point, the bronchoscope was introduced through the endotracheal tube and the trachea was properly visualized. The endotracheal tube was then gradually withdrawn within the trachea under direct bronchoscopic visualization. Proper midline position was confirmed by bouncing the needle from the tracheostomy tray over the trachea with bronchoscopic examination. The needle was advanced into the trachea and proper positioning was confirmed with direct visualization. The needle was then removed leaving a white outer cannula in position. The wire from the tracheostomy tray was then advanced through the white outer cannula. The cannula was then removed. The small, blue dilator was then advanced over the wire into the trachea. Once proper dilatation was achieved, the dilator was removed. The large, tapered dilator was then advanced over the wire into the trachea. The dilator was removed leaving the wire and  white inner cannula in position. A number 6-0 percutaneous Shiley tracheostomy tube was then advanced over the wire and white inner cannula into the trachea. Proper positioning was confirmed with bronchoscopic visualization. The tracheostomy tube was then sutured in place with four nylon sutures. It was further secured with a tracheostomy tie.  Estimated blood loss: Less than 5 mL.  Complications: None immediate.  CXR ordered.

## 2020-03-17 NOTE — Progress Notes (Signed)
RT assisted with bedside trach. #6 cuffed shiley was placed. Patient continuing on ventilator at this time. RT will continue to monitor.

## 2020-03-17 NOTE — Progress Notes (Addendum)
NAME:  Kenneth Sullivan, MRN:  [id number], DOB:  Aug 30, 1956, LOS: 10 ADMISSION DATE:  03/07/2020, CONSULTATION DATE:  03/07/20 REFERRING MD:  Dr Roxanne Mins MD, CHIEF COMPLAINT:  Cardiac arrest  Brief History   64 year old with no significant past medical history.  Had acute shortness of breath with sats in the 40s on EMS arrival.  He had a witnessed bradycardic, asystolic arrest when EMS was present with 3 rounds of epi, 20 minutes to ROSC. Underwent normothermia protocol.  Work-up significant for massive PE, DVT with RV strain, GI bleed from gastric ulcer.  Past Medical History  Has not seen a doctor.  No past medical history. Smokes 1 pack/day, drinks 2 to 3 40 ounce beers every day.  Smokes marijuana.  Denies any other recreational drug use.  Significant Hospital Events   3/23 Admit, EGD with findings of oozing gastric ulcer with clot, IR for mechanical thrombectomy, IVC filter and GDA embolization 3/24 Started heparin drip  Consults:  Cardiology, GI, interventional radiology, neurolgoy  Procedures:  ETT 3/23 >> L TLC 3/23 >>  ALine L Fem 3/23 >> 3/30  Significant Diagnostic Tests:  CTA 3/23 >> bilateral PE with RV/with ratio 1.9, emphysema CT head 3/23 >> chronic microvascular changes.  No acute findings Echo 3/23 >> RV is severely dilated with reduced function and D-shaped septum, LVEF 60-65%. LE Venous Duplex 3/24 >> acute DVT in BLE up to the femoral vein  MRI Brain 3/28 >> multifocal acute to subacute ischemic infarcts involving bilateral cerebral hemispheres, findings consistent with global hypoperfusion event related to cardiac arrest, associated scattered petechial hemorrhage about many areas of ischemia, evidence of hemorrhagic conversion in the right parietal lobe  Micro Data:  Bcx 3/23 >> negative [name] 3/23 >> negative BCx2 3/23 >> negative   Antimicrobials:    Interim history/subjective:  Currently on propofol and fentanyl  Objective   Blood pressure 103/68, pulse  90, temperature 98.8 F (37.1 C), resp. rate 15, height 6\' 2"  (1.88 m), weight 85 kg, SpO2 94 %.    Vent Mode: PRVC FiO2 (%):  [40 %] 40 % Set Rate:  [15 bmp] 15 bmp Vt Set:  [650 mL] 650 mL PEEP:  [5 cmH20] 5 cmH20 Plateau Pressure:  [0.18 cmH20-20 cmH20] 18 cmH20   Intake/Output Summary (Last 24 hours) at 03/17/2020 0908 Last data filed at 03/17/2020 0800 Gross per 24 hour  Intake 456.74 ml  Output 1160 ml  Net -703.26 ml   Filed Weights   03/15/20 0450 03/16/20 0426 03/17/20 0410  Weight: 85 kg 85.3 kg 85 kg    Examination: General: 64 year old male currently on propofol and fentanyl HEENT: Endotracheal tube gastric tube in place Neuro: No response  to verbal stimulus CV:  PULM: Decreased breath sounds in the bases Vent pressure regulated volume control FIO2 40 PEEP 5 RATE 18 VT 650  GI: soft, bsx4 active  GU: Extremities: warm/dry,  edema  Skin: no rashes or lesions   Resolved Hospital Problem list   Hypoglycemia Transaminitis  Assessment & Plan:   Cardiac Arrest secondary to Massive PE Asystole as initial rhythm secondary to massive PE, downtime ~ 20 minutes.  Initial CT head w/o acute abnormality. S/P TTM.  Follow up MRI with hemorrhagic conversion.  Heparin discontinued initially due to blood loss and then due to Hemorrhagic conversion of his CVA >> will not restart Norepinephrine weaned to off IVC filter in place    Anoxic Encephalopathy CVA with Hemorrhagic Conversion  MRI with multiple acute and subacute  infarcts with evidence of hemorrhagic conversion. Affecting language and speech center of brain. Repeat neurological consult on 03/16/2020 which concerns poor prognosis for any meaningful recovery. Continue current interventions per request of family Monitor neurological status   Acute Respiratory Failure secondary to Massive PE with Acute Cor Pulmonale Status post mechanical thrombectomy and IVC filter placement No anticoagulation at this time Full  mechanical ventilatory support    Acute Upper GI bleed secondary to large Gastric Ulcer Recent Labs    03/15/20 0451  HGB 9.8*    Continue to monitor Transfuse per protocol  BLE DVT IVC filter is in place   Hypoglycemia CBG (last 3)  Recent Labs    04-06-2020 0349 2020/04/06 0426 04/06/2020 0805  GLUCAP 62* 147* 70   Add d5w 4/2  Best practice:  Diet: TPN  Pain/Anxiety/Delirium protocol (if indicated): PAD protocol  VAP protocol (if indicated): in place DVT prophylaxis: IVC filter / SCD's  GI prophylaxis: Pepcid Glucose control: SSI Mobility: Bed Code Status: Full Code  Family Communication: Spoke with the patient's niece [name] who is his main family contact on 3/31.  She is planning to withdraw care on 04-07-2023.  In the interim I recommended that he the patient be DNR in the event that he arrest.  She does not want to change his CODE STATUS because she is concerned about the patient's finances and her ability to pay for his cremation, burial, memorial.  She will have access to the patient's next check on Friday, states that she cannot use this money if he dies before 04/07/23. 04-07-2020 no family at bedside  Disposition: ICU    CC Time: [place] Nurse Practitioner [place] Please consult Amion 06-Apr-2020, 9:09 AM

## 2020-03-17 NOTE — Procedures (Signed)
Bronchoscopy  for Percutaneous  Tracheostomy  Name: Kenneth Sullivan MRN: [id number] DOB: 10-Jul-1956 Procedure: Bronchoscopy for Percutaneous Tracheostomy Indications: Diagnostic evaluation of the airways and tracheostomy placement In conjunction with: Dr. Tamala Julian  Procedure Details Consent: Risks of procedure as well as the alternatives and risks of each were explained to the (patient/caregiver).  Consent for procedure obtained. Time Out: Verified patient identification, verified procedure, site/side was marked, verified correct patient position, special equipment/implants available, medications/allergies/relevent history reviewed, required imaging and test results available.  Performed  In preparation for procedure, patient was given 100% FiO2 and bronchoscope lubricated. Sedation: Muscle relaxants, Etomidate and Short-acting barbiturates  Airway entered and the following bronchi were examined: trachea and carina Procedures performed: Endotracheal Tube retracted in 2 cm increments. Cannulation of airway observed. Dilation observed. Placement of trachel tube  observed . No overt complications. Bronchoscope removed.    Evaluation Hemodynamic Status: BP stable throughout; O2 sats: stable throughout Patient's Current Condition: stable Specimens:  None Complications: No apparent complications Patient did tolerate procedure well.   Richardson Landry Johnson [place] ACNP Maryanna Shape PCCM Pager [phone] till 3 pm If no answer page [phone] 03/17/2020, 4:02 PM

## 2020-03-17 NOTE — Progress Notes (Signed)
Attempted to call niece after tracheostomy to let her know went well, she did not answer.  Erskine Emery MD PCCM

## 2020-03-18 ENCOUNTER — Inpatient Hospital Stay (HOSPITAL_COMMUNITY): Payer: Medicaid Other

## 2020-03-18 DIAGNOSIS — J9601 Acute respiratory failure with hypoxia: Secondary | ICD-10-CM | POA: Diagnosis not present

## 2020-03-18 DIAGNOSIS — G931 Anoxic brain damage, not elsewhere classified: Secondary | ICD-10-CM | POA: Diagnosis not present

## 2020-03-18 DIAGNOSIS — I469 Cardiac arrest, cause unspecified: Secondary | ICD-10-CM | POA: Diagnosis not present

## 2020-03-18 LAB — CBC WITH DIFFERENTIAL/PLATELET
Abs Immature Granulocytes: 0.11 10*3/uL — ABNORMAL HIGH (ref 0.00–0.07)
Basophils Absolute: 0 10*3/uL (ref 0.0–0.1)
Basophils Relative: 0 %
Eosinophils Absolute: 0.1 10*3/uL (ref 0.0–0.5)
Eosinophils Relative: 1 %
HCT: 27 % — ABNORMAL LOW (ref 39.0–52.0)
Hemoglobin: 8.7 g/dL — ABNORMAL LOW (ref 13.0–17.0)
Immature Granulocytes: 1 %
Lymphocytes Relative: 8 %
Lymphs Abs: 1 10*3/uL (ref 0.7–4.0)
MCH: 31.9 pg (ref 26.0–34.0)
MCHC: 32.2 g/dL (ref 30.0–36.0)
MCV: 98.9 fL (ref 80.0–100.0)
Monocytes Absolute: 0.7 10*3/uL (ref 0.1–1.0)
Monocytes Relative: 6 %
Neutro Abs: 10.6 10*3/uL — ABNORMAL HIGH (ref 1.7–7.7)
Neutrophils Relative %: 84 %
Platelets: 321 10*3/uL (ref 150–400)
RBC: 2.73 MIL/uL — ABNORMAL LOW (ref 4.22–5.81)
RDW: 16.6 % — ABNORMAL HIGH (ref 11.5–15.5)
WBC: 12.7 10*3/uL — ABNORMAL HIGH (ref 4.0–10.5)
nRBC: 0 % (ref 0.0–0.2)

## 2020-03-18 LAB — BASIC METABOLIC PANEL
Anion gap: 11 (ref 5–15)
BUN: 24 mg/dL — ABNORMAL HIGH (ref 8–23)
CO2: 29 mmol/L (ref 22–32)
Calcium: 8.5 mg/dL — ABNORMAL LOW (ref 8.9–10.3)
Chloride: 102 mmol/L (ref 98–111)
Creatinine, Ser: 0.82 mg/dL (ref 0.61–1.24)
GFR calc Af Amer: >60 mL/min (ref >60)
GFR calc non Af Amer: >60 mL/min (ref >60)
Glucose, Bld: 107 mg/dL — ABNORMAL HIGH (ref 70–99)
Potassium: 2.9 mmol/L — ABNORMAL LOW (ref 3.5–5.1)
Sodium: 142 mmol/L (ref 135–145)

## 2020-03-18 LAB — GLUCOSE, CAPILLARY
Glucose-Capillary: 101 mg/dL — ABNORMAL HIGH (ref 70–99)
Glucose-Capillary: 91 mg/dL (ref 70–99)
Glucose-Capillary: 94 mg/dL (ref 70–99)
Glucose-Capillary: 94 mg/dL (ref 70–99)
Glucose-Capillary: 96 mg/dL (ref 70–99)
Glucose-Capillary: 96 mg/dL (ref 70–99)

## 2020-03-18 LAB — PHOSPHORUS: Phosphorus: 3.2 mg/dL (ref 2.5–4.6)

## 2020-03-18 LAB — MAGNESIUM: Magnesium: 2.1 mg/dL (ref 1.7–2.4)

## 2020-03-18 MED ORDER — POTASSIUM CHLORIDE 10 MEQ/50ML IV SOLN
10.0000 meq | INTRAVENOUS | Status: AC
  Administered 2020-03-18 (×4): 10 meq via INTRAVENOUS
  Filled 2020-03-18 (×5): qty 50

## 2020-03-18 MED ORDER — CHLORHEXIDINE GLUCONATE CLOTH 2 % EX PADS
6.0000 | MEDICATED_PAD | Freq: Every day | CUTANEOUS | Status: DC
  Administered 2020-03-18 – 2020-04-15 (×29): 6 via TOPICAL

## 2020-03-18 MED ORDER — POTASSIUM CHLORIDE 10 MEQ/50ML IV SOLN
10.0000 meq | INTRAVENOUS | Status: AC
  Administered 2020-03-18 (×6): 10 meq via INTRAVENOUS
  Filled 2020-03-18 (×5): qty 50

## 2020-03-18 NOTE — Progress Notes (Signed)
NAME:  Kenneth Sullivan, MRN:  [id number], DOB:  Jan 18, 1956, LOS: 11 ADMISSION DATE:  03/07/2020, CONSULTATION DATE:  03/07/20 REFERRING MD:  Dr Roxanne Mins MD, CHIEF COMPLAINT:  Cardiac arrest  Brief History   64 year old with no significant past medical history.  Had acute shortness of breath with sats in the 40s on EMS arrival.  He had a witnessed bradycardic, asystolic arrest when EMS was present with 3 rounds of epi, 20 minutes to ROSC. Underwent normothermia protocol.  Work-up significant for massive PE, DVT with RV strain, GI bleed from gastric ulcer.  Past Medical History  Has not seen a doctor.  No past medical history. Smokes 1 pack/day, drinks 2 to 3 40 ounce beers every day.  Smokes marijuana.  Denies any other recreational drug use.  Significant Hospital Events   3/23 Admit, EGD with findings of oozing gastric ulcer with clot, IR for mechanical thrombectomy, IVC filter and GDA embolization 3/24 Started heparin drip  Consults:  Cardiology, GI, interventional radiology, neurolgoy  Procedures:  ETT 3/23 >> L TLC 3/23 >>  ALine L Fem 3/23 >> 3/30  Significant Diagnostic Tests:  CTA 3/23 >> bilateral PE with RV/with ratio 1.9, emphysema CT head 3/23 >> chronic microvascular changes.  No acute findings Echo 3/23 >> RV is severely dilated with reduced function and D-shaped septum, LVEF 60-65%. LE Venous Duplex 3/24 >> acute DVT in BLE up to the femoral vein  MRI Brain 3/28 >> multifocal acute to subacute ischemic infarcts involving bilateral cerebral hemispheres, findings consistent with global hypoperfusion event related to cardiac arrest, associated scattered petechial hemorrhage about many areas of ischemia, evidence of hemorrhagic conversion in the right parietal lobe  Micro Data:  Bcx 3/23 >> negative [name] 3/23 >> negative BCx2 3/23 >> negative   Antimicrobials:    Interim history/subjective:  Propofol off, fentanyl 50 Currently on PSV Potassium given this  morning  Objective   Blood pressure 110/69, pulse 91, temperature 99.3 F (37.4 C), resp. rate 18, height 6\' 2"  (1.88 m), weight 83.6 kg, SpO2 99 %.    Vent Mode: PSV FiO2 (%):  [40 %-100 %] 40 % Set Rate:  [15 bmp-18 bmp] 18 bmp Vt Set:  [650 mL] 650 mL PEEP:  [5 cmH20] 5 cmH20 Pressure Support:  [8 cmH20] 8 cmH20 Plateau Pressure:  [13 cmH20-24 cmH20] 24 cmH20   Intake/Output Summary (Last 24 hours) at 03/18/2020 0914 Last data filed at 03/18/2020 0900 Gross per 24 hour  Intake 1791.8 ml  Output 1190 ml  Net 601.8 ml   Filed Weights   03/16/20 0426 03/17/20 0410 03/18/20 0351  Weight: 85.3 kg 85 kg 83.6 kg    Examination: General: 64 year old male currently fentanyl HEENT: Tracheostomy in place, ventilated, clean dry intact Neuro: Some spontaneous movement.  He opens eyes mouth, has some extensor posturing to voice, stimulation.  Does not follow commands or track PULM: Very decreased bilaterally, no wheezing or crackles Heart regular without a murmur GI: Nondistended, positive bowel sounds GU: Foley catheter in place Extremities: 1-2+ bilateral pretibial edema, foot drop boots in place Skin: No rash   Resolved Hospital Problem list   Hypoglycemia Transaminitis  Assessment & Plan:   Cardiac Arrest secondary to Massive PE Asystole as initial rhythm secondary to massive PE, downtime ~ 20 minutes.  Initial CT head w/o acute abnormality. S/P TTM.  Follow up MRI with strokes and hemorrhagic conversion.  Heparin discontinued initially due to blood loss and then due to Hemorrhagic conversion of his CVA >>  no plan to restart anticoagulation Norepinephrine weaned to off IVC filter in place Supportive care  Anoxic Encephalopathy CVA's with Hemorrhagic Conversion  MRI with multiple acute and subacute infarcts with evidence of hemorrhagic conversion. Affecting language and speech center of brain. Repeat neurological consult on 03/16/2020 >> poor prognosis for any meaningful  recovery. Based on conversations with family regarding goals plan is for continued aggressive support, current level of care to assess for any significant neurological improvement  Acute Respiratory Failure secondary to Massive PE with Acute Cor Pulmonale Status post mechanical thrombectomy and IVC filter placement No anticoagulation at this time due to contraindications, risks Tolerating PSV.  Attempt to work towards ATC as soon as he can tolerate.  Need to continue to wean his sedating medication to off in order to facilitate    Acute Upper GI bleed secondary to large Gastric Ulcer Following CBC No evidence of active blood loss since his GDA embolization Inserted transfusion protocol, goal hemoglobin 7.0 Nutrition will be an issue.  Unclear whether he can get a PEG tube given his gastric ulcer.  Will need to reconsult with GI next week  BLE DVT IVC filter is in place  Total body volume overload.  +14.5 L total Dosing Lasix daily based on tolerance, renal function  Hypokalemia Replete as indicated  Hypoglycemia Currently on D5W with improvement, wean as able.  No good way to give nutrition at this time  Best practice:  Diet: NPO, TPN deferred given his overall neurological prognosis Pain/Anxiety/Delirium protocol (if indicated): PAD protocol  VAP protocol (if indicated): in place DVT prophylaxis: IVC filter / SCD's  GI prophylaxis: Pepcid Glucose control: SSI Mobility: Bed Code Status: DNR Family Communication: Patient's niece [name] is main family contact.  Updating daily.  She and other family want continued aggressive care and support to see if he can have a neurological improvement.  Agreed to DNR status should he arrest. Disposition: ICU    CC Time: 31 minutes  Baltazar Apo, MD, PhD 03/18/2020, 9:27 AM [facility] [phone] or if no answer [phone]

## 2020-03-18 NOTE — Progress Notes (Signed)
eLink Physician-Brief Progress Note Patient Name: Kenneth Sullivan DOB: 11-Apr-1956 MRN: [id number]   Date of Service  03/18/2020  HPI/Events of Note  Hypokalemia  eICU Interventions  40 meq IV Kcl  Would recheck labs after repletion and decide further dosing      Intervention Category Major Interventions: Electrolyte abnormality - evaluation and management  Margaretmary Lombard 03/18/2020, 5:35 AM

## 2020-03-18 NOTE — Progress Notes (Signed)
PT Cancellation Note  Patient Details Name: Kenneth Sullivan MRN: [id number] DOB: 1956/04/10   Cancelled Treatment:    Reason Eval/Treat Not Completed: Other (comment). PT orders received as part of trach order set. Pt with severe neurological injury with poor prognosis for recovery. Currently not following commands and poorly responsive. Will sign off at this time. Please re-order if appropriate.    Brethren 03/18/2020, 10:45 AM [place] Pager [phone] Office [phone]

## 2020-03-18 NOTE — Evaluation (Signed)
SLP Cancellation Note  Patient Details Name: Kenneth Sullivan MRN: [id number] DOB: Jul 23, 1956   Cancelled treatment:       Reason Eval/Treat Not Completed: Other (comment);Medical issues which prohibited therapy(pt remains on vent,will continue efforts)   Macario Golds 03/18/2020, 8:11 AM Kathleen Lime, MS [place] Office [phone]

## 2020-03-19 DIAGNOSIS — G931 Anoxic brain damage, not elsewhere classified: Secondary | ICD-10-CM | POA: Diagnosis not present

## 2020-03-19 DIAGNOSIS — J9601 Acute respiratory failure with hypoxia: Secondary | ICD-10-CM | POA: Diagnosis not present

## 2020-03-19 DIAGNOSIS — I469 Cardiac arrest, cause unspecified: Secondary | ICD-10-CM | POA: Diagnosis not present

## 2020-03-19 LAB — GLUCOSE, CAPILLARY
Glucose-Capillary: 77 mg/dL (ref 70–99)
Glucose-Capillary: 85 mg/dL (ref 70–99)
Glucose-Capillary: 86 mg/dL (ref 70–99)
Glucose-Capillary: 88 mg/dL (ref 70–99)
Glucose-Capillary: 89 mg/dL (ref 70–99)
Glucose-Capillary: 90 mg/dL (ref 70–99)

## 2020-03-19 LAB — BASIC METABOLIC PANEL
Anion gap: 11 (ref 5–15)
BUN: 16 mg/dL (ref 8–23)
CO2: 28 mmol/L (ref 22–32)
Calcium: 8.3 mg/dL — ABNORMAL LOW (ref 8.9–10.3)
Chloride: 103 mmol/L (ref 98–111)
Creatinine, Ser: 0.86 mg/dL (ref 0.61–1.24)
GFR calc Af Amer: >60 mL/min (ref >60)
GFR calc non Af Amer: >60 mL/min (ref >60)
Glucose, Bld: 100 mg/dL — ABNORMAL HIGH (ref 70–99)
Potassium: 3.2 mmol/L — ABNORMAL LOW (ref 3.5–5.1)
Sodium: 142 mmol/L (ref 135–145)

## 2020-03-19 MED ORDER — FENTANYL CITRATE (PF) 100 MCG/2ML IJ SOLN
25.0000 ug | INTRAMUSCULAR | Status: DC | PRN
  Administered 2020-03-19 – 2020-03-25 (×4): 50 ug via INTRAVENOUS
  Administered 2020-03-26: 25 ug via INTRAVENOUS
  Administered 2020-03-26: 50 ug via INTRAVENOUS
  Filled 2020-03-19 (×6): qty 2

## 2020-03-19 MED ORDER — POTASSIUM CHLORIDE 10 MEQ/50ML IV SOLN
10.0000 meq | INTRAVENOUS | Status: AC
  Administered 2020-03-19 (×4): 10 meq via INTRAVENOUS
  Filled 2020-03-19 (×4): qty 50

## 2020-03-19 MED ORDER — MIDAZOLAM HCL 2 MG/2ML IJ SOLN: 2.0000 mg | INTRAMUSCULAR | Status: DC | PRN

## 2020-03-19 NOTE — Progress Notes (Signed)
Pt placed back on full vent support on documented settings due to increased WOB, and RR on the mid 30's. Pt tolerating well.

## 2020-03-19 NOTE — Progress Notes (Signed)
SLP Cancellation Note  Patient Details Name: Kenneth Sullivan MRN: [id number] DOB: 1956-02-17   Cancelled treatment:       Reason Eval/Treat Not Completed: Medical issues which prohibited therapy(Pt remains on vent at this time. SLP will follow up. )  Alaysiah Browder I. Hardin Negus, [place], [place] Office number [phone] Pager [phone]  Horton Marshall 03/19/2020, 8:25 AM

## 2020-03-19 NOTE — Progress Notes (Signed)
NAME:  Kenneth Sullivan, MRN:  [id number], DOB:  1956-01-04, LOS: 12 ADMISSION DATE:  03/07/2020, CONSULTATION DATE:  03/07/20 REFERRING MD:  Dr Roxanne Mins MD, CHIEF COMPLAINT:  Cardiac arrest  Brief History   64 year old with no significant past medical history.  Had acute shortness of breath with sats in the 40s on EMS arrival.  He had a witnessed bradycardic, asystolic arrest when EMS was present with 3 rounds of epi, 20 minutes to ROSC. Underwent normothermia protocol.  Work-up significant for massive PE, DVT with RV strain, GI bleed from gastric ulcer.  Past Medical History  Has not seen a doctor.  No past medical history. Smokes 1 pack/day, drinks 2 to 3 40 ounce beers every day.  Smokes marijuana.  Denies any other recreational drug use.  Significant Hospital Events   3/23 Admit, EGD with findings of oozing gastric ulcer with clot, IR for mechanical thrombectomy, IVC filter and GDA embolization 3/24 Started heparin drip  Consults:  Cardiology, GI, interventional radiology, neurolgoy  Procedures:  ETT 3/23 >> L TLC 3/23 >>  ALine L Fem 3/23 >> 3/30  Significant Diagnostic Tests:  CTA 3/23 >> bilateral PE with RV/with ratio 1.9, emphysema CT head 3/23 >> chronic microvascular changes.  No acute findings Echo 3/23 >> RV is severely dilated with reduced function and D-shaped septum, LVEF 60-65%. LE Venous Duplex 3/24 >> acute DVT in BLE up to the femoral vein  MRI Brain 3/28 >> multifocal acute to subacute ischemic infarcts involving bilateral cerebral hemispheres, findings consistent with global hypoperfusion event related to cardiac arrest, associated scattered petechial hemorrhage about many areas of ischemia, evidence of hemorrhagic conversion in the right parietal lobe  Micro Data:  Bcx 3/23 >> negative [name] 3/23 >> negative BCx2 3/23 >> negative   Antimicrobials:    Interim history/subjective:  On ATC this morning and tolerating  Objective   Blood pressure 111/79, pulse  (!) 102, temperature 99.1 F (37.3 C), temperature source Oral, resp. rate (!) 27, height 6\' 2"  (1.88 m), weight 76 kg, SpO2 (!) 74 %.    Vent Mode: Stand-by FiO2 (%):  [40 %] 40 % Set Rate:  [18 bmp] 18 bmp Vt Set:  [640 mL] 640 mL PEEP:  [5 cmH20] 5 cmH20 Pressure Support:  [8 cmH20] 8 cmH20 Plateau Pressure:  [21 cmH20-24 cmH20] 21 cmH20   Intake/Output Summary (Last 24 hours) at 03/19/2020 1431 Last data filed at 03/19/2020 1300 Gross per 24 hour  Intake 1570.76 ml  Output 800 ml  Net 770.76 ml   Filed Weights   03/17/20 0410 03/18/20 0351 03/19/20 0500  Weight: 85 kg 83.6 kg 76 kg    Examination: General: 64 year old man, ill-appearing HEENT: Tracheostomy in place, on ATC, clear secretions noted with a strong cough Neuro: Some spontaneous movement.  He opens his eyes, does not track, does not follow commands he did turn his head to voice PULM: Decreased bilaterally, few scattered rhonchi, no wheezing Heart regular, no murmur GI: Nondistended, positive bowel sounds GU: Foley catheter Extremities: 1-2+ bilateral pretibial edema with foot drop boots in place Skin: No rash   Resolved Hospital Problem list   Hypoglycemia Transaminitis  Assessment & Plan:   Cardiac Arrest secondary to Massive PE Asystole as initial rhythm secondary to massive PE, downtime ~ 20 minutes.  Initial CT head w/o acute abnormality. S/P TTM.  Follow up MRI with strokes and hemorrhagic conversion.  Heparin discontinued initially due to blood loss and then due to Hemorrhagic conversion of his  CVA >> no plan to restart anticoagulation Norepinephrine has been weaned to off IVC filter in place Continue supportive care, telemetry  Anoxic Encephalopathy CVA's with Hemorrhagic Conversion  MRI with multiple acute and subacute infarcts with evidence of hemorrhagic conversion. Affecting language and speech center of brain. Repeat neurological consult on 03/16/2020 >> poor prognosis for any meaningful  recovery. Poor prognosis for meaningful recovery.  Family wants all full aggressive care in order to see if he can show neurological improvement.  Acute Respiratory Failure secondary to Massive PE with Acute Cor Pulmonale Status post mechanical thrombectomy and IVC filter placement No anticoagulation planned at this time due to contraindication and risk Tolerating ATC today.  We will see if he can go overnight.  Try to continue ad lib.  If he can be offered 48 hours we will check an ABG, ensure adequate ventilation  Acute Upper GI bleed secondary to large Gastric Ulcer Follow CBC.  No evidence of blood loss since his GDA embolization Conservative transfusion protocol, goal hemoglobin 7.0 Nutrition will be an issue.  He underwent endoscopy 03/07/2020 that showed a large gastric ulcer.  It has been recommended that he not have G-tube for 2 weeks, which would be 4/6.  Will need to consult with gastroenterology guarding possible PEG placement at that time.  BLE DVT BC filter in place  Total body volume overload.  +14.9 L total Dosing Lasix based on renal function, blood pressure, currently on 40 mg daily.  Hypokalemia Replete as indicated  Hypoglycemia Currently on D5W with improvement.  Should be able to wean off once we are able to give enteral nutrition.  Best practice:  Diet: NPO, TPN deferred given his overall neurological prognosis Pain/Anxiety/Delirium protocol (if indicated): PAD protocol  VAP protocol (if indicated): in place DVT prophylaxis: IVC filter / SCD's  GI prophylaxis: Pepcid Glucose control: SSI Mobility: Bed Code Status: DNR Family Communication: Patient's niece [name] is main family contact.  Updating daily.  She and other family want continued aggressive care and support to see if he can have a neurological improvement.  Agreed to DNR status should he arrest. Disposition: ICU    CC Time: N/A  Baltazar Apo, MD, PhD 03/19/2020, 2:31 PM [facility] [phone] or if no answer [phone]

## 2020-03-19 NOTE — Progress Notes (Signed)
Assisted tele visit to patient with family member.  Jackee Glasner P, RN  

## 2020-03-19 NOTE — Progress Notes (Signed)
[facility] Progress Note Patient Name: Kenneth Sullivan DOB: 1956/04/28 MRN: [id number]   Date of Service  03/19/2020  HPI/Events of Note  Notified of K 3.2  eICU Interventions  Ordered K total of 40 meqs     Intervention Category Major Interventions: Electrolyte abnormality - evaluation and management  Judd Lien 03/19/2020, 6:42 AM

## 2020-03-20 DIAGNOSIS — K254 Chronic or unspecified gastric ulcer with hemorrhage: Secondary | ICD-10-CM | POA: Diagnosis not present

## 2020-03-20 DIAGNOSIS — Z66 Do not resuscitate: Secondary | ICD-10-CM | POA: Diagnosis not present

## 2020-03-20 DIAGNOSIS — J9601 Acute respiratory failure with hypoxia: Secondary | ICD-10-CM | POA: Diagnosis not present

## 2020-03-20 DIAGNOSIS — Z515 Encounter for palliative care: Secondary | ICD-10-CM

## 2020-03-20 DIAGNOSIS — I469 Cardiac arrest, cause unspecified: Secondary | ICD-10-CM | POA: Diagnosis not present

## 2020-03-20 DIAGNOSIS — Z7189 Other specified counseling: Secondary | ICD-10-CM | POA: Diagnosis not present

## 2020-03-20 DIAGNOSIS — G931 Anoxic brain damage, not elsewhere classified: Secondary | ICD-10-CM | POA: Diagnosis not present

## 2020-03-20 LAB — GLUCOSE, CAPILLARY
Glucose-Capillary: 101 mg/dL — ABNORMAL HIGH (ref 70–99)
Glucose-Capillary: 82 mg/dL (ref 70–99)
Glucose-Capillary: 87 mg/dL (ref 70–99)
Glucose-Capillary: 90 mg/dL (ref 70–99)
Glucose-Capillary: 92 mg/dL (ref 70–99)
Glucose-Capillary: 95 mg/dL (ref 70–99)
Glucose-Capillary: 96 mg/dL (ref 70–99)

## 2020-03-20 MED ORDER — SCOPOLAMINE 1 MG/3DAYS TD PT72
1.0000 | MEDICATED_PATCH | TRANSDERMAL | Status: DC
  Administered 2020-03-20 – 2020-04-13 (×9): 1.5 mg via TRANSDERMAL
  Filled 2020-03-20 (×9): qty 1

## 2020-03-20 MED ORDER — IPRATROPIUM-ALBUTEROL 0.5-2.5 (3) MG/3ML IN SOLN
3.0000 mL | Freq: Four times a day (QID) | RESPIRATORY_TRACT | Status: DC
  Administered 2020-03-20 – 2020-03-21 (×5): 3 mL via RESPIRATORY_TRACT
  Filled 2020-03-20 (×5): qty 3

## 2020-03-20 NOTE — Progress Notes (Addendum)
SLP Cancellation Note  Patient Details Name: Kenneth Sullivan MRN: [id number] DOB: [birth year]   Cancelled treatment:       Reason Eval/Treat Not Completed: Patient not medically ready, remains on vent. Will continue to follow.    Osie Bond., M.A. [facility] Pager [phone] Office [phone]  03/20/2020, 7:06 AM

## 2020-03-20 NOTE — Progress Notes (Signed)
Assisted tele visit to patient with family member.  Quincy Prisco P, RN  

## 2020-03-20 NOTE — Progress Notes (Signed)
Per MD, pt is not to go back on vent

## 2020-03-20 NOTE — Consult Note (Addendum)
Consultation Note Date: 03/20/2020   Patient Name: Kenneth Sullivan  DOB: [birth year]  MRN: [id number]  Age / Sex: 64 y.o., male  PCP: Medicine, [name] Referring Physician: Garner Nash, DO  Reason for Consultation: Establishing goals of care  HPI/Patient Profile:  Per Intensivist note --> 89 year old with no significant past medical history.  Had acute shortness of breath with sats in the 40s on EMS arrival.  He had a witnessed bradycardic, asystolic arrest when EMS was present with 3 rounds of epi, 20 minutes to ROSC. Underwent normothermia protocol.  Work-up significant for massive PE, DVT with RV strain, GI bleed from gastric ulcer.  Palliative care asked to get involved in the setting of a poor prognosis. Patient per niece would not want to go to [facility]. Per the intensivist team: current no escalation of care, patient will not be placed back on mechanical support or ventilator, patient remains DNR, & no PEG tube placement.   Clinical Assessment and Goals of Care: I have reviewed medical records including EPIC notes, labs and imaging, received report from bedside RN, assessed the patient.    I called patients niece to Kenneth Sullivan to further discuss diagnosis prognosis, Gilberts, EOL wishes, disposition and options though she did not answer.  I introduced Palliative Medicine as specialized medical care for people living with serious illness. It focuses on providing relief from the symptoms and stress of a serious illness. The goal is to improve quality of life for both the patient and the family.  I called patients partner, Kenneth Sullivan to get a better social history. Per Kenneth Sullivan patient was born and raised in [name]. During his time there he worked at [employer]. He has been married prior and has two children who he is estranged from. He moved to [place], [place] in 2011 to  live with his niece, Kenneth Sullivan due to being homeless. He was with her for sometime then ended up in [place] living at a rooming house. He and Kenneth Sullivan have been with one another since 2017. Since being in [place] he has partaken in "odd jobs". In regard to things he enjoys, Duff loves musical artists like Financial risk analyst, [place]. He enjoyed watching crime shows like [employer] and Proofreader.   Kenneth Sullivan states that before hospitalization Kenneth Sullivan was able to do everything for himself inclusive of setting up meals, taking medications, dressing, and bathing himself. She said that she has been displaced since he has been hospitalized and is herself now in a homeless shelter.   I asked Kenneth Sullivan if she understands what is going on at the present time with Anis's health state, she shared that she does not know exactly. I deferred providing too much information as Kenneth Sullivan has been receiving and disseminating information. I did advise that her visiting may be of benefit to the patient. She seemed to understand this though shares that she has been unable to visit due to not having a car.  Per discussion with ICU team  Kenneth Sullivan did not want heroic measures for Kenneth Sullivan but also did not want to be responsible for hastening his time on earth. I have tried to call Kenneth Sullivan multiple times this evening to no avail. The palliative team will follow up tomorrow to get a better impression of family wishes. We are hopeful to coordinate an in person meeting. In the meantime there will be no escalation of care for [facility] per note review.   Discussed the importance of continued conversation with family and their  medical providers regarding overall plan of care and treatment options, ensuring decisions are within the context of the patients values and GOCs.  Decision Maker: Kenneth Sullivan (Niece)  SUMMARY OF RECOMMENDATIONS   DNAR  Ongoing [place] discussions, try to schedule an in person meeting with niece  [name] Consult  Code Status/Advance Care Planning:  DNR   Symptom Management:   Per primary  Palliative Prophylaxis:   Aspiration, Bowel Regimen, Delirium Protocol, Eye Care, Frequent Pain Assessment, Oral Care, Palliative Wound Care and Turn Reposition  Additional Recommendations (Limitations, Scope, Preferences):  Minimize medications  Psycho-social/Spiritual:   Desire for further Chaplaincy support: YES  Additional Recommendations: Caregiving  Support/Resources  Prognosis:   Prognosis without nutrition is quite poor < 2 weeks  Discharge Planning: To Be Determined      Primary Diagnoses: Present on Admission: . Cardiac arrest ([place])   I have reviewed the medical record, interviewed the patient and family, and examined the patient. The following aspects are pertinent.  Past Medical History:  Diagnosis Date  . Cardiac arrest ([place]) 03/07/2020  . Known health problems: none    No recent medical care, has not had a physical in years   Social History   Socioeconomic History  . Marital status: Single    Spouse name: Not on file  . Number of children: Not on file  . Years of education: Not on file  . Highest education level: Not on file  Occupational History  . Not on file  Tobacco Use  . Smoking status: Current Every Day Smoker    Packs/day: 1.00    Types: Cigarettes  . Smokeless tobacco: Never Used  Substance and Sexual Activity  . Alcohol use: Yes    Alcohol/week: 21.0 standard drinks    Types: 21 Cans of beer per week    Comment: 2 or 3  -40 ounce beers a day  . Drug use: Yes    Types: Marijuana  . Sexual activity: Not on file  Other Topics Concern  . Not on file  Social History Narrative   Lives with wife.  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   Social Determinants of Health   Financial Resource Strain:   . Difficulty of Paying Living Expenses:   Food Insecurity:   . Worried About Charity fundraiser in the Last Year:     . Arboriculturist in the Last Year:   Transportation Needs:   . Film/video editor (Medical):   Marland Kitchen Lack of Transportation (Non-Medical):   Physical Activity:   . Days of Exercise per Week:   . Minutes of Exercise per Session:   Stress:   . Feeling of Stress :   Social Connections:   . Frequency of Communication with Friends and Family:   . Frequency of Social Gatherings with Friends and Family:   . Attends Religious Services:   . Active Member of Clubs or Organizations:   . Attends Archivist Meetings:   .  Marital Status:    Family History  Problem Relation Age of Onset  . Heart attack Neg Hx    Scheduled Meds: . chlorhexidine gluconate (MEDLINE KIT)  15 mL Mouth Rinse BID  . Chlorhexidine Gluconate Cloth  6 each Topical Q0600  . mouth rinse  15 mL Mouth Rinse 10 times per day  . pantoprazole  40 mg Intravenous Q12H  . scopolamine  1 patch Transdermal Q72H  . sodium chloride flush  10-40 mL Intracatheter Q12H   Continuous Infusions: . sodium chloride Stopped (03/13/20 1943)  . sodium chloride    . dextrose 50 mL/hr at 03/20/20 1200   PRN Meds:.sodium chloride, sodium chloride, acetaminophen, docusate sodium, fentaNYL (SUBLIMAZE) injection, gelatin adsorbable, sodium chloride flush Medications Prior to Admission:  Prior to Admission medications   Not on File   No Known Allergies Review of Systems  Unable to perform ROS: Other   Physical Exam Vitals and nursing note reviewed.  HENT:     Head: Normocephalic.     Nose: Nose normal.     Mouth/Throat:     Mouth: Mucous membranes are dry.  Eyes:     Pupils: Pupils are equal, round, and reactive to light.  Neck:     Comments: Tracheostomy in place Cardiovascular:     Rate and Rhythm: Normal rate and regular rhythm.  Pulmonary:     Effort: Pulmonary effort is normal.  Abdominal:     General: Abdomen is flat.     Palpations: Abdomen is soft.  Musculoskeletal:     Cervical back: Normal range of  motion.  Skin:    General: Skin is dry.     Capillary Refill: Capillary refill takes less than 2 seconds.  Neurological:     Mental Status: He is alert.    Vital Signs: BP 120/78   Pulse (!) 101   Temp 99.5 F (37.5 C) (Oral)   Resp (!) 28   Ht 6' 2"  (1.88 m)   Wt 76.1 kg   SpO2 100%   BMI 21.54 kg/m  Pain Scale: CPOT     SpO2: SpO2: 100 % O2 Device:SpO2: 100 % O2 Flow Rate: .O2 Flow Rate (L/min): 8 L/min  IO: Intake/output summary:   Intake/Output Summary (Last 24 hours) at 03/20/2020 1758 Last data filed at 03/20/2020 1446 Gross per 24 hour  Intake 898.74 ml  Output 2475 ml  Net -1576.26 ml   LBM: Last BM Date: 03/15/20 Baseline Weight: Weight: 72.6 kg Most recent weight: Weight: 76.1 kg     Palliative Assessment/Data: 10%  Time In: 1725 Time Out: 1820 Time Total: 55 Greater than 50%  of this time was spent counseling and coordinating care related to the above assessment and plan.  Signed by: Rosezella Rumpf, NP   Please contact Palliative Medicine Team phone at [phone] for questions and concerns.  For individual provider: See Shea Evans

## 2020-03-20 NOTE — Progress Notes (Signed)
NAME:  Kenneth Sullivan, MRN:  [id number], DOB:  Mar 13, 1956, LOS: 71 ADMISSION DATE:  03/07/2020, CONSULTATION DATE:  03/07/20 REFERRING MD:  Dr Roxanne Mins MD, CHIEF COMPLAINT:  Cardiac arrest  Brief History   64 year old with no significant past medical history.  Had acute shortness of breath with sats in the 40s on EMS arrival.  He had a witnessed bradycardic, asystolic arrest when EMS was present with 3 rounds of epi, 20 minutes to ROSC. Underwent normothermia protocol.  Work-up significant for massive PE, DVT with RV strain, GI bleed from gastric ulcer.  Past Medical History  Has not seen a doctor.  No past medical history. Smokes 1 pack/day, drinks 2 to 3 40 ounce beers every day.  Smokes marijuana.  Denies any other recreational drug use.  Significant Hospital Events   3/23 Admit, EGD with findings of oozing gastric ulcer with clot, IR for mechanical thrombectomy, IVC filter and GDA embolization 3/24 Started heparin drip  Consults:  Cardiology, GI, interventional radiology, neurolgoy  Procedures:  ETT 3/23 >> L TLC 3/23 >>  ALine L Fem 3/23 >> 3/30  Significant Diagnostic Tests:  CTA 3/23 >> bilateral PE with RV/with ratio 1.9, emphysema CT head 3/23 >> chronic microvascular changes.  No acute findings Echo 3/23 >> RV is severely dilated with reduced function and D-shaped septum, LVEF 60-65%. LE Venous Duplex 3/24 >> acute DVT in BLE up to the femoral vein  MRI Brain 3/28 >> multifocal acute to subacute ischemic infarcts involving bilateral cerebral hemispheres, findings consistent with global hypoperfusion event related to cardiac arrest, associated scattered petechial hemorrhage about many areas of ischemia, evidence of hemorrhagic conversion in the right parietal lobe  Micro Data:  Bcx 3/23 >> negative Kenneth Sullivan 3/23 >> negative BCx2 3/23 >> negative   Antimicrobials:    Interim history/subjective:  No issues overnight.  Long discussion with niece Kenneth Sullivan via phone  She is  very conflicted her husband was a quadraplegic. She doesn't want her uncle to suffer for as many years as her husband did. She also does not want to feel like she is giving up on him. She knows that he would never want a feeding tube.   Objective   Blood pressure 116/74, pulse 97, temperature 99.5 F (37.5 C), temperature source Oral, resp. rate (!) 29, height 6\' 2"  (1.88 m), weight 76.1 kg, SpO2 99 %.    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  PULM: Diminished breath sounds bilaterally no crackles no wheeze Heart r regular rate and rhythm S1-S2 GI: Nondistended, bowel sounds present GU: Deferred Extremities: No edema, bilateral foot drop Skin: No rash   Resolved Hospital Problem list   Hypoglycemia Transaminitis  Assessment & Plan:   Cardiac Arrest secondary to Massive PE Asystole as initial rhythm secondary  to massive PE, downtime ~ 20 minutes.  Initial CT head w/o acute abnormality. S/P TTM.  Follow up MRI with strokes and hemorrhagic conversion.  IVC filter in place, no plans for reinitiation of heparin due to hemorrhagic conversion of previous CVAs. Continue supportive care  Anoxic Encephalopathy CVA's with Hemorrhagic Conversion  MRI with multiple acute and subacute infarcts with evidence of hemorrhagic conversion. Affecting language and speech center  of brain. Repeat neurological consult on 03/16/2020 >> poor prognosis for any meaningful recovery. Patient's family have expressed wishes that they would not want LTAC placement. They also do not want him to have a feeding tube if at all possible. Therefore ongoing discussions at this point have led to no plans for escalation of care and no heroic measures. Niece is planning to visit tomorrow.  Acute Respiratory Failure secondary to Massive PE with Acute Cor Pulmonale Status post mechanical thrombectomy and IVC filter placement -Continue trach collar trial as tolerated   Acute Upper GI bleed secondary to large Gastric Ulcer Status post GDA embolization -Continue observation of hemoglobin -This at the moment precludes placement of any type of enteral access.  BLE DVT IVC filter in place  Hypokalemia Replete as necessary  Hypoglycemia Currently on D5W. Current with no enteral access.  Patient's family does not want a PEG tube.  They also do not want LTAC placement  Goals of care: Consult placed to palliative care. Current no escalation of care. Patient will not be placed back on mechanical support or ventilator. Patient remains DNR. Plans for no PEG tube placement.   Best practice:  Diet: NPO, TPN deferred given his overall neurological prognosis Pain/Anxiety/Delirium protocol (if indicated): PAD protocol  VAP protocol (if indicated): in place DVT prophylaxis: IVC filter / SCD's  GI prophylaxis: Pepcid Glucose control: SSI Mobility: Bed Code Status: DNR Family Communication: I spoke with Kenneth Sullivan  Disposition: ICU  Garner Nash, DO [facility] 03/20/2020 12:06 PM

## 2020-03-21 DIAGNOSIS — I2699 Other pulmonary embolism without acute cor pulmonale: Secondary | ICD-10-CM

## 2020-03-21 DIAGNOSIS — J9601 Acute respiratory failure with hypoxia: Secondary | ICD-10-CM | POA: Diagnosis not present

## 2020-03-21 DIAGNOSIS — Z66 Do not resuscitate: Secondary | ICD-10-CM

## 2020-03-21 DIAGNOSIS — Z515 Encounter for palliative care: Secondary | ICD-10-CM

## 2020-03-21 DIAGNOSIS — G931 Anoxic brain damage, not elsewhere classified: Secondary | ICD-10-CM | POA: Diagnosis not present

## 2020-03-21 DIAGNOSIS — Z7189 Other specified counseling: Secondary | ICD-10-CM

## 2020-03-21 DIAGNOSIS — I469 Cardiac arrest, cause unspecified: Secondary | ICD-10-CM | POA: Diagnosis not present

## 2020-03-21 DIAGNOSIS — I63429 Cerebral infarction due to embolism of unspecified anterior cerebral artery: Secondary | ICD-10-CM

## 2020-03-21 LAB — BASIC METABOLIC PANEL
Anion gap: 12 (ref 5–15)
BUN: 9 mg/dL (ref 8–23)
CO2: 26 mmol/L (ref 22–32)
Calcium: 8.4 mg/dL — ABNORMAL LOW (ref 8.9–10.3)
Chloride: 100 mmol/L (ref 98–111)
Creatinine, Ser: 0.76 mg/dL (ref 0.61–1.24)
GFR calc Af Amer: >60 mL/min (ref >60)
GFR calc non Af Amer: >60 mL/min (ref >60)
Glucose, Bld: 102 mg/dL — ABNORMAL HIGH (ref 70–99)
Potassium: 2.8 mmol/L — ABNORMAL LOW (ref 3.5–5.1)
Sodium: 138 mmol/L (ref 135–145)

## 2020-03-21 LAB — GLUCOSE, CAPILLARY
Glucose-Capillary: 80 mg/dL (ref 70–99)
Glucose-Capillary: 86 mg/dL (ref 70–99)
Glucose-Capillary: 87 mg/dL (ref 70–99)
Glucose-Capillary: 87 mg/dL (ref 70–99)
Glucose-Capillary: 89 mg/dL (ref 70–99)
Glucose-Capillary: 95 mg/dL (ref 70–99)

## 2020-03-21 MED ORDER — POTASSIUM CHLORIDE 10 MEQ/100ML IV SOLN: 10.0000 meq | INTRAVENOUS | Status: DC

## 2020-03-21 MED ORDER — POTASSIUM CHLORIDE 10 MEQ/100ML IV SOLN
10.0000 meq | INTRAVENOUS | Status: AC
  Administered 2020-03-21 – 2020-03-22 (×7): 10 meq via INTRAVENOUS
  Filled 2020-03-21 (×7): qty 100

## 2020-03-21 NOTE — Evaluation (Addendum)
[facility]y-[facility] Speaking Valve - Evaluation Patient Details  Name: Kenneth Sullivan MRN: [id number] Date of Birth: Apr 17, 1956  Today's Date: 03/21/2020 Time: 0911-0935 SLP Time Calculation (min) (ACUTE ONLY): 24 min  Past Medical History:  Past Medical History:  Diagnosis Date  . Cardiac arrest ([place]) 03/07/2020  . Known health problems: none    No recent medical care, has not had a physical in years   Past Surgical History:  Past Surgical History:  Procedure Laterality Date  . ESOPHAGOGASTRODUODENOSCOPY (EGD) WITH PROPOFOL N/A 03/07/2020   Procedure: ESOPHAGOGASTRODUODENOSCOPY (EGD) WITH PROPOFOL;  Surgeon: Wilford Corner, MD;  Location: [facility];  Service: Endoscopy;  Laterality: N/A;  . IR ANGIOGRAM PULMONARY BILATERAL SELECTIVE  03/07/2020  . IR ANGIOGRAM SELECTIVE EACH ADDITIONAL VESSEL  03/07/2020  . IR ANGIOGRAM SELECTIVE EACH ADDITIONAL VESSEL  03/07/2020  . IR ANGIOGRAM SELECTIVE EACH ADDITIONAL VESSEL  03/07/2020  . IR ANGIOGRAM VISCERAL SELECTIVE  03/07/2020  . IR EMBO ART  VEN HEMORR LYMPH EXTRAV  INC GUIDE ROADMAPPING  03/07/2020  . IR IVC FILTER PLMT / S&I /IMG GUID/MOD SED  03/07/2020  . IR THROMBECT PRIM MECH INIT (INCLU) MOD SED  03/07/2020  . IR US GUIDE VASC ACCESS RIGHT  03/07/2020  . IR US GUIDE VASC ACCESS RIGHT  03/07/2020  . IR US GUIDE VASC ACCESS RIGHT  03/07/2020   HPI:  64 year old admitted 3/23 with shortness of breath with sats in the 40s on with witnessed bradycardic, asystolic arrest when EMS was present with 3 rounds of epi, 20 minutes to ROSC per chart. Found to also have bilateral PR's and had GI bleed following arrest. MRI showed multifocal acute-subactue ischemic infarcts c/w global hypoperfusion. Intubated 3/23 adn trach placed 4/2. CXR 4/3 no active disease. No evidence of pneumonia or pulmonary edema. Per chart pt smokes 1 pack/day, consumes 2-3 forty oz beers daily, + marijuana.   Assessment / Plan / Recommendation Clinical Impression  Pt encountered with  copious saliva falling from oral cavity onto gown and mucous via trach. He holds saliva and shakes his head when requested to swallow and therapist suspects candidia due to facial grimcace when swallow was achieved. Shook head to most information and appeared frustrated. Pt most responsive when SLP slowed pace of assessment and fully explained each step. SLP either removed valve from hub or was coughed off after longest interval of one minute with evidence of air trapping. Eventually, placement and removal of valve facilitated effective mucous removal of secretions from larynx producing vocal quality that was mild-moderately intelligible in single words. He requested a cigarette x 2 and that he goes by Kenneth Sullivan". RR ranged 19-36%, HR 97 and SpO2 100%. Valve should only be worn with SLP with full assessment and hopefully move to wearing during waking hours. He is not appropriate for swallow assessment given decreased oral-motor control and poor secretion management. Will need short term alternative needs. ST will continue to follow.     SLP Visit Diagnosis: Aphonia (R49.1)    SLP Assessment  Patient needs continued Speech Lanaguage Pathology Services    Follow Up Recommendations  Other (comment)(TBD)    Frequency and Duration min 2x/week  2 weeks    PMSV Trial PMSV was placed for: (intervals up to one min) Able to redirect subglottic air through upper airway: Yes Able to Attain Phonation: Yes Voice Quality: Wet;Low vocal intensity Able to Expectorate Secretions: Yes Level of Secretion Expectoration with PMSV: Tracheal;Oral Breath Support for Phonation: Mildly decreased Intelligibility: Intelligibility reduced Word: 25-49% accurate Respirations During  Trial: (19-36) SpO2 During Trial: 100 % Pulse During Trial: 97 Behavior: Alert;Angry;Other (comment);Responsive to questions(frustrated, responds with gestures )   Tracheostomy Tube       Vent Dependency  FiO2 (%): 35 %    Cuff Deflation  Trial  GO Tolerated Cuff Deflation: (deflated on arrival) Behavior: Alert;Other (comment);Responsive to questions(frustrated )        Houston Siren 03/21/2020, 10:03 AM [name] Colvin Caroli.Ed Risk analyst [phone] Office [phone]

## 2020-03-21 NOTE — Progress Notes (Signed)
Placed pt on inline trach collar with 6L Bleed in.suction due to increased amount of secretions.

## 2020-03-21 NOTE — TOC Initial Note (Signed)
Transition of Care [facility]) - Initial/Assessment Note    Patient Details  Name: Kenneth Sullivan MRN: [id number] Date of Birth: Jan 17, 1956  Transition of Care [facility]) CM/SW Contact:    Maryclare Labrador, RN Phone Number: 03/21/2020, 9:35 AM  Clinical Narrative:    Pt remains in ICU.  Pt is s/p trach.  Pt has been tolerating TC 35% FIO2, vent at night.  Pt still with NG tube and family refusing PEG at this time.  Pt does not have LTACH benefits and therefore disposition will be SNF if facility is required - vent or trach capable snf will be determined based on progression.   Pt will also need to either be on PO feeds or have a PEG before pt can be placed at snf.                Expected Discharge Plan: [place] Barriers to Discharge: Continued Medical Work up   Patient Goals and CMS Choice        Expected Discharge Plan and Services Expected Discharge Plan: [place]                                              Prior Living Arrangements/Services                       Activities of Daily Living      Permission Sought/Granted                  Emotional Assessment              Admission diagnosis:  Cardiac arrest [facility]) [I46.9] Acute respiratory failure ([place]) [J96.00] Patient Active Problem List   Diagnosis Date Noted  . Palliative care by specialist   . Goals of care, counseling/discussion   . DNR (do not resuscitate)   . Pulmonary emboli ([place])   . 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 Acute respiratory failure ([place])   . Anoxic encephalopathy ([place])   . Cardiac arrest ([place]) 03/07/2020  . GI bleed 03/07/2020  . Encounter for central line placement    PCP:  [name]:   [id number] - [place], [place] [id number] [place] [id number] Phone: [phone] Fax:  [phone]     Social Determinants of Health (SDOH) Interventions    Readmission Risk Interventions No flowsheet data found.

## 2020-03-21 NOTE — Progress Notes (Addendum)
NAME:  Kenneth Sullivan, MRN:  [id number], DOB:  Jul 24, 1956, LOS: 47 ADMISSION DATE:  03/07/2020, CONSULTATION DATE:  03/07/20 REFERRING MD:  Dr Roxanne Mins MD, CHIEF COMPLAINT:  Cardiac arrest  Brief History   64 year old with no significant past medical history.  Had acute shortness of breath with sats in the 40s on EMS arrival.  He had a witnessed bradycardic, asystolic arrest when EMS was present with 3 rounds of epi, 20 minutes to ROSC. Underwent normothermia protocol.  Work-up significant for massive PE, DVT with RV strain, GI bleed from gastric ulcer.  Past Medical History  Has not seen a doctor.  No past medical history. Smokes 1 pack/day, drinks 2 to 3 40 ounce beers every day.  Smokes marijuana.  Denies any other recreational drug use.  Significant Hospital Events   3/23 Admit, EGD with findings of oozing gastric ulcer with clot, IR for mechanical thrombectomy, IVC filter and GDA embolization 3/24 Started heparin drip  Consults:  Cardiology, GI, interventional radiology, neurolgoy  Procedures:  ETT 3/23 >>4/5 Trach 4/5>> L TLC 3/23 >>  ALine L Fem 3/23 >> 3/30  Significant Diagnostic Tests:  CTA 3/23 >> bilateral PE with RV/with ratio 1.9, emphysema CT head 3/23 >> chronic microvascular changes.  No acute findings Echo 3/23 >> RV is severely dilated with reduced function and D-shaped septum, LVEF 60-65%. LE Venous Duplex 3/24 >> acute DVT in BLE up to the femoral vein  MRI Brain 3/28 >> multifocal acute to subacute ischemic infarcts involving bilateral cerebral hemispheres, findings consistent with global hypoperfusion event related to cardiac arrest, associated scattered petechial hemorrhage about many areas of ischemia, evidence of hemorrhagic conversion in the right parietal lobe  Micro Data:  Bcx 3/23 >> negative [name] 3/23 >> negative BCx2 3/23 >> negative   Antimicrobials:    Interim history/subjective:  Currently following commands by sticking out time.   Tracheostomy is in place is on trach collar 24/7.  States he is now following commands escalation of care will have to be entertained. No tube feeds per family instructions  Objective   Blood pressure 120/78, pulse 100, temperature 99.1 F (37.3 C), temperature source Oral, resp. rate (!) 26, height 6\' 2"  (1.88 m), weight 76.1 kg, SpO2 100 %.    FiO2 (%):  [35 %] 35 %   Intake/Output Summary (Last 24 hours) at 03/21/2020 0827 Last data filed at 03/21/2020 0600 Gross per 24 hour  Intake 1142.21 ml  Output 2475 ml  Net -1332.79 ml   Filed Weights   03/18/20 0351 03/19/20 0500 03/20/20 0500  Weight: 83.6 kg 76 kg 76.1 kg    Examination:  General: Cachectic male no acute distress HEENT: No JVD or lymphadenopathy is appreciated.  Tracheostomy is unremarkable Neuro: Intermittently follows commands by sticking out tongue tracking and follow CV: Heart sounds are regular PULM: Diminished throughout with abdominal chest wall paradoxus noted Extremities: warm/dry,  edema  Skin: no rashes or lesions    Resolved Hospital Problem list   Hypoglycemia Transaminitis  Assessment & Plan:   Cardiac Arrest secondary to Massive PE Asystole as initial rhythm secondary to massive PE, downtime ~ 20 minutes.  Initial CT head w/o acute abnormality. S/P TTM.  Follow up MRI with strokes and hemorrhagic conversion.  IVC filter in place, no plans for reinitiation of heparin due to hemorrhagic conversion of previous CVAs. Continue supportive care  Anoxic Encephalopathy CVA's with Hemorrhagic Conversion  MRI with multiple acute and subacute infarcts with evidence of hemorrhagic conversion. Affecting  language and speech center of brain. Repeat neurological consult on 03/16/2020 >> poor prognosis for any meaningful recovery. Patient's family have expressed wishes that they would not want LTAC placement. They also do not want him to have a feeding tube if at all possible.Marland Kitchen  He is following commands of  03/21/2020 Therefore consideration for LTAC and tube feedings will have to be entertained.  Acute Respiratory Failure secondary to Massive PE with Acute Cor Pulmonale Status post mechanical thrombectomy and IVC filter placement  Currently on trach collar was no plans for further ventilatory support  Acute Upper GI bleed secondary to large Gastric Ulcer Status post GDA embolization We will need GI services input as to starting tube feedings    Hypokalemia Recent Labs  Lab 03/15/20 0451 03/18/20 0352 03/19/20 0244  K 3.8 2.9* 3.2*    Replete as necessary  Hypoglycemia CBG (last 3)  Recent Labs    03/20/20 2333 03/21/20 0329 03/21/20 0801  GLUCAP 95 95 87    Currently on dextrose 5% Family did not want PEG tube placement but is following commands therefore feeding will have to be entertained   Goals of care: Consult placed to palliative care. Current no escalation of care. Patient will not be placed back on mechanical support or ventilator. Patient remains DNR. Plans for no PEG tube placement.  Currently following commands 03/21/2020 therefore may need tube feedings in the near future  Best practice:  Diet: NPO, TPN deferred given his overall neurological prognosis Pain/Anxiety/Delirium protocol (if indicated): PAD protocol  VAP protocol (if indicated): in place DVT prophylaxis: IVC filter / SCD's  GI prophylaxis: Pepcid Glucose control: SSI Mobility: Bed Code Status: DNR Family Communication: Family updated per MD Disposition: ICU tx to [facility] and [facility] to pick up in am. Dr. De Blanch aware  Richardson Landry Minor ACNP Acute Care Nurse Practitioner [place] Please consult [place] 03/21/2020, 11:28 AM    PCCM Attending:   64 yo massive PE, cardiac arrest, GI bleeding, s/p GDA coiling  Comfortable on trach collar   BP (!) 152/95   Pulse (!) 105   Temp 99.2 F (37.3 C) (Axillary)   Resp (!) 22   Ht 6\' 2"  (1.88 m)   Wt 76.1 kg   SpO2 95%   BMI  21.54 kg/m   Gen: chronically ill appearing, resting in bed on TCT  Nck: trach in place, lots of clear secretions Heart: RRR s1 s2 Lungs: CTAB   Labs reviewed   A: Cardiac arrest Massive PE  GI bleed GDA coiling  Chronic resp failure, trach in place on TCT   P: DNR family does not want to place patient back on vent  They do not want LTAC placement  They do not want PEG tube  Palliative was consulted Stable for transfer to the floor from ICU with no plans for escalation of care  Garner Nash, DO [place] [place] [facility] 03/21/2020 2:41 PM

## 2020-03-21 NOTE — Progress Notes (Signed)
VAST consulted to obtain IV access. Pt currently has CL. Advised charge nurse we will return later in shift.

## 2020-03-21 NOTE — Plan of Care (Signed)
  Problem: Activity: Goal: Ability to tolerate increased activity will improve Outcome: Progressing   Problem: Respiratory: Goal: Ability to maintain a clear airway and adequate ventilation will improve Outcome: Progressing   Problem: Role Relationship: Goal: Method of communication will improve Outcome: Progressing   Problem: Activity: Goal: Ability to tolerate increased activity will improve Outcome: Progressing   Problem: Respiratory: Goal: Ability to maintain a clear airway and adequate ventilation will improve Outcome: Progressing   Problem: Role Relationship: Goal: Method of communication will improve Outcome: Progressing   Problem: Education: Goal: Knowledge of General Education information will improve Description: Including pain rating scale, medication(s)/side effects and non-pharmacologic comfort measures Outcome: Progressing   Problem: Health Behavior/Discharge Planning: Goal: Ability to manage health-related needs will improve Outcome: Progressing   Problem: Clinical Measurements: Goal: Will remain free from infection Outcome: Progressing Goal: Diagnostic test results will improve Outcome: Progressing Goal: Respiratory complications will improve Outcome: Progressing Goal: Cardiovascular complication will be avoided Outcome: Progressing   Problem: Activity: Goal: Risk for activity intolerance will decrease Outcome: Progressing   Problem: Nutrition: Goal: Adequate nutrition will be maintained Outcome: Progressing   Problem: Coping: Goal: Level of anxiety will decrease Outcome: Progressing   Problem: Elimination: Goal: Will not experience complications related to bowel motility Outcome: Progressing Goal: Will not experience complications related to urinary retention Outcome: Progressing   Problem: Pain Managment: Goal: General experience of comfort will improve Outcome: Progressing   Problem: Safety: Goal: Ability to remain free from injury  will improve Outcome: Progressing   Problem: Skin Integrity: Goal: Risk for impaired skin integrity will decrease Outcome: Progressing

## 2020-03-21 NOTE — Progress Notes (Signed)
Patient transferred via bed to [id number]. Continues to have large amount of secretions and require frequent tracheal suctioning. VSS

## 2020-03-21 NOTE — Progress Notes (Signed)
Daily Progress Note   Patient Name: Kenneth Sullivan       Date: 03/21/2020 DOB: 02/17/1956  Age: 64 y.o. MRN#: [id number] Attending Physician: Garner Nash, DO Primary Care Physician: [facility], [name] Admit Date: 03/07/2020  Reason for Consultation/Follow-up: Establishing goals of care  Subjective: Patient awake, alert. Denies pain. Somewhat agitated. Will follow some commands and mouth response.Mouths "leave me alone" when requesting to stick out tongue he sticks out tongue also mouthing "I am not doing nothing else!" Large amount of secretions noted to tracheostomy. Observed coughing with secretions and patient frowning and rolling eyes. Appears agitated on each occurrence.   Attempted to contact sister, Vito Berger to reach on both listed numbers. She is planning to video chat with patient at [phone]. RN will provide her with my information to give our team a return call.   Per previous notations and discussions family expressed wishes for no peg or LTAC. Will need to address goals of care in the setting patient is now awake, alert, and able to follow some commands.   Continue current plan of care with no escalation of care and no heroic measures unless goals expressed differently by patient/niece, [name].    Length of Stay: 14  Current Medications: Scheduled Meds:  . chlorhexidine gluconate (MEDLINE KIT)  15 mL Mouth Rinse BID  . Chlorhexidine Gluconate Cloth  6 each Topical Q0600  . ipratropium-albuterol  3 mL Nebulization Q6H  . mouth rinse  15 mL Mouth Rinse 10 times per day  . pantoprazole  40 mg Intravenous Q12H  . scopolamine  1 patch Transdermal Q72H  . sodium chloride flush  10-40 mL Intracatheter Q12H    Continuous Infusions: . sodium chloride Stopped  (03/13/20 1943)  . sodium chloride    . dextrose 50 mL/hr at 03/21/20 0600    PRN Meds: sodium chloride, sodium chloride, acetaminophen, docusate sodium, fentaNYL (SUBLIMAZE) injection, gelatin adsorbable, sodium chloride flush  Physical Exam   -awake, alert, frail, thin, somewhat agitated (frowning)       -RRR -Tracheostomy in place, large amount of clear secretions, cough -follows commands and mouth responses  Vital Signs: BP 116/80   Pulse 100   Temp 99.1 F (37.3 C) (Oral)   Resp 16   Ht 6' 2"  (1.88 m)   Wt 76.1 kg  SpO2 99%   BMI 21.54 kg/m  SpO2: SpO2: 99 % O2 Device: O2 Device: Tracheostomy Collar O2 Flow Rate: O2 Flow Rate (L/min): 8 L/min  Intake/output summary:   Intake/Output Summary (Last 24 hours) at 03/21/2020 1051 Last data filed at 03/21/2020 0900 Gross per 24 hour  Intake 1172.24 ml  Output 2475 ml  Net -1302.76 ml   LBM: Last BM Date: 03/21/20 Baseline Weight: Weight: 72.6 kg Most recent weight: Weight: 76.1 kg       Palliative Assessment/Data:PPS 20%      Patient Active Problem List   Diagnosis Date Noted  . Palliative care by specialist   . Goals of care, counseling/discussion   . DNR (do not resuscitate)   . Pulmonary emboli ([place])   . 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 Acute respiratory failure ([place])   . Anoxic encephalopathy ([place])   . Cardiac arrest ([place]) 03/07/2020  . GI bleed 03/07/2020  . Encounter for central line placement     Palliative Care Assessment & Plan   Patient Profile: Per Intensivist note --> 5 year old with no significant past medical history. Had acute shortness of breath with sats in the 40s on EMS arrival. He had a witnessed bradycardic, asystolic arrest when EMS was present with 3 rounds of epi, 20 minutes to ROSC. Underwent normothermia protocol. Work-up significant for massive PE, DVT with RV strain, GI bleed from gastric ulcer.  Palliative care asked to get involved in the setting  of a poor prognosis. Patient per niece would not want to go to [facility]. Per the intensivist team: current no escalation of care, patient will not be placed back on mechanical support or ventilator, patient remains DNR, & no PEG tube placement.  Recommendations/Plan:  DNR, no escalation of care, no peg per notes   Continue current plan of care per medical team  Attempted to reach niece without success. Message given with return phone number. Will need further goals of care discussion regarding short-term/long-term nutritional plan in the setting patient is more aware, alert, and following commands today.   PMT will continue to support and follow   Goals of Care and Additional Recommendations:  Limitations on Scope of Treatment: No Artificial Feeding and DNR, no escalation of care  Code Status:    Code Status Orders  (From admission, onward)         Start     Ordered   03/16/20 1304  Do not attempt resuscitation (DNR)  Continuous    Question Answer Comment  In the event of cardiac or respiratory ARREST Do not call a "code blue"   In the event of cardiac or respiratory ARREST Do not perform Intubation, CPR, defibrillation or ACLS   In the event of cardiac or respiratory ARREST Use medication by any route, position, wound care, and other measures to relive pain and suffering. May use oxygen, suction and manual treatment of airway obstruction as needed for comfort.      03/16/20 1303        Code Status History    Date Active Date Inactive Code Status Order ID Comments User Context   03/07/2020 0837 03/16/2020 1303 Full Code [id number]  Marshell Garfinkel, MD ED   03/07/2020 0831 03/07/2020 0836 Full Code [id number]  Marshell Garfinkel, MD ED   Advance Care Planning Activity      Prognosis:   Guarded   Discharge Planning:  To Be Determined  Care plan was discussed with RN.  Thank you for allowing the Palliative  Medicine Team to assist in the care of this patient.  Time Total: 20  min.   Visit consisted of counseling and education dealing with the complex and emotionally intense issues of symptom management and palliative care in the setting of serious and potentially life-threatening illness.Greater than 50%  of this time was spent counseling and coordinating care related to the above assessment and plan.  Alda Lea, AGPCNP-BC  Palliative Medicine Team [phone]   Please contact Palliative Medicine Team phone at [phone] for questions and concerns.

## 2020-03-22 ENCOUNTER — Inpatient Hospital Stay (HOSPITAL_COMMUNITY): Payer: Medicaid Other

## 2020-03-22 ENCOUNTER — Other Ambulatory Visit: Payer: Self-pay

## 2020-03-22 DIAGNOSIS — Z930 Tracheostomy status: Secondary | ICD-10-CM

## 2020-03-22 DIAGNOSIS — I2699 Other pulmonary embolism without acute cor pulmonale: Secondary | ICD-10-CM | POA: Diagnosis not present

## 2020-03-22 DIAGNOSIS — J9601 Acute respiratory failure with hypoxia: Secondary | ICD-10-CM | POA: Diagnosis not present

## 2020-03-22 DIAGNOSIS — G931 Anoxic brain damage, not elsewhere classified: Secondary | ICD-10-CM | POA: Diagnosis not present

## 2020-03-22 DIAGNOSIS — I469 Cardiac arrest, cause unspecified: Secondary | ICD-10-CM | POA: Diagnosis not present

## 2020-03-22 DIAGNOSIS — R1310 Dysphagia, unspecified: Secondary | ICD-10-CM

## 2020-03-22 LAB — MAGNESIUM: Magnesium: 1.7 mg/dL (ref 1.7–2.4)

## 2020-03-22 LAB — BASIC METABOLIC PANEL
Anion gap: 12 (ref 5–15)
BUN: 6 mg/dL — ABNORMAL LOW (ref 8–23)
CO2: 26 mmol/L (ref 22–32)
Calcium: 8.1 mg/dL — ABNORMAL LOW (ref 8.9–10.3)
Chloride: 98 mmol/L (ref 98–111)
Creatinine, Ser: 0.77 mg/dL (ref 0.61–1.24)
GFR calc Af Amer: >60 mL/min (ref >60)
GFR calc non Af Amer: >60 mL/min (ref >60)
Glucose, Bld: 96 mg/dL (ref 70–99)
Potassium: 2.9 mmol/L — ABNORMAL LOW (ref 3.5–5.1)
Sodium: 136 mmol/L (ref 135–145)

## 2020-03-22 LAB — GLUCOSE, CAPILLARY
Glucose-Capillary: 78 mg/dL (ref 70–99)
Glucose-Capillary: 79 mg/dL (ref 70–99)
Glucose-Capillary: 87 mg/dL (ref 70–99)

## 2020-03-22 LAB — PHOSPHORUS: Phosphorus: 2.7 mg/dL (ref 2.5–4.6)

## 2020-03-22 MED ORDER — POTASSIUM CHLORIDE 10 MEQ/100ML IV SOLN
10.0000 meq | Freq: Once | INTRAVENOUS | Status: AC
  Administered 2020-03-22: 10 meq via INTRAVENOUS

## 2020-03-22 MED ORDER — IPRATROPIUM-ALBUTEROL 0.5-2.5 (3) MG/3ML IN SOLN: 3.0000 mL | Freq: Four times a day (QID) | RESPIRATORY_TRACT | Status: DC | PRN

## 2020-03-22 MED ORDER — POTASSIUM CHLORIDE 10 MEQ/100ML IV SOLN
10.0000 meq | INTRAVENOUS | Status: AC
  Administered 2020-03-22 (×3): 10 meq via INTRAVENOUS
  Filled 2020-03-22 (×4): qty 100

## 2020-03-22 NOTE — Progress Notes (Addendum)
PROGRESS NOTE    Rachit Grim  VZS:[id number] DOB: [birth year] DOA: 03/07/2020 PCP: [facility], [name]  Brief Narrative:  64 year old gentleman with no significant past medical history presents with shortness of breath and hypoxic had a witnessed bradycardic asystolic arrest underwent normothermia protocol was found to have significant massive PE, DVT with RV strain and acute GI bleed from gastric ulcer.  Patient was admitted on March 07 2020.  When EGD showing oozing gastric ulcer with clot.  He underwent IR guided mechanical thrombectomy, IVC filter placement and GDA embolization.  His hospital course was complicated by multifocal acute to subacute ischemic infarcts involving bilateral cerebral hemispheres consistent with global hypoperfusion related to cardiac arrest associated with scattered petechial hemorrhages with evidence of hemorrhagic conversion in the right parietal lobe.  Assessment & Plan:   Principal Problem:   Cardiac arrest [facility]) Active Problems:   Encounter for central line placement   GI bleed   Acute respiratory failure (H[facility])   Anoxic encephalopathy (H[facility])   Cerebral embolism with cerebral infarction   Pulmonary emboli ([place])   Palliative care by specialist   Goals of care, counseling/discussion   DNR (do not resuscitate)   Cardiac arrest secondary to massive pulmonary embolism S/p  3 rounds of epi, 20 min to ROSC, underwent normothermic protocol.  MRI of the brain shows multiple acute to subacute ischemic infarcts involving bilateral cerebral hemispheres consistent with global hypoperfusion related to cardiac arrest.  S/p IVC filter placement Continue supportive care   Anoxic encephalopathy CVAs with hemorrhagic conversion MRI showing multiple acute and subacute infarcts with evidence of hemorrhagic conversion For prognosis for any meaningful recovery. Palliative care consulted and goals of care discussion in progress. Family at this time does not  want any feeding tubes or LTAC placement Patient currently not following commands at this time. Unable to anti coagulate due to hemorrhagic conversion of the infarcts.     Acute respiratory failure with hypoxia secondary to massive PE with cor pulmonale S/p intubation on admission and s/p trach placement on 03/20/2020.  S/p mechanical thrombectomy by IR and IVC filter placement for the acute DVT, unable to anti coagulate due to hemorrhagic conversion of the multiple infarcts.  Currently on trach collar and able to tolerate Continue with pulmonary hygiene. scopolamine patch for upper respiratory trach secretions.      Acute GI bleed secondary to large gastric ulcer S/p GDA embolization. IV ppi.     Nutrition No PEG placement as per family. ?  Temporary tube feeds, on dextrose fluids.  NPO at this time.   Persistent hypokalemia Replaced, repeat in am.    DVT prophylaxis: SCDs Code Status: DNR Family Communication: None at bedside Disposition Plan:  . Patient came from: Home           . Anticipated d/c place: Pending . Barriers to d/c OR conditions which need to be met to effect a safe d/c: Pending palliative care discussion with the family   Consultants:   Gastroenterology  Cardiology  IR  Neurology  Procedures:  CTA 3/23 >> bilateral PE with RV/with ratio 1.9, emphysema CT head 3/23 >> chronic microvascular changes.  No acute findings Echo 3/23 >> RV is severely dilated with reduced function and D-shaped septum, LVEF 60-65%. LE Venous Duplex 3/24 >> acute DVT in BLE up to the femoral vein  MRI Brain 3/28 >> multifocal acute to subacute ischemic infarcts involving bilateral cerebral hemispheres, findings consistent with global hypoperfusion event related to cardiac arrest, associated scattered petechial  hemorrhage about many areas of ischemia, evidence of hemorrhagic conversion in the right parietal lobe. ETT 3/23 >>4/5 Trach 4/5>> L TLC 3/23 >>  ALine L Fem  3/23 >> 3/30    Antimicrobials: None   Subjective: Ill-appearing gentleman, not in any kind of distress  Objective: Vitals:   03/22/20 0300 03/22/20 0325 03/22/20 0837 03/22/20 0900  BP:  (!) 150/98  (!) 150/90  Pulse: 88 (!) 112  84  Resp: (!) 22 (!) 32  (!) 25  Temp:    98.3 F (36.8 C)  TempSrc:    Axillary  SpO2: 97% 100% 100% 100%  Weight:      Height:        Intake/Output Summary (Last 24 hours) at 03/22/2020 1033 Last data filed at 03/22/2020 0900 Gross per 24 hour  Intake 1620 ml  Output 751 ml  Net 869 ml   Filed Weights   03/18/20 0351 03/19/20 0500 03/20/20 0500  Weight: 83.6 kg 76 kg 76.1 kg    Examination:  General exam: Ill-appearing gentleman cachectic, s/p trach, not in any kind of distress respiratory system: Diminished air entry at bases, no wheezing, coarse breath sounds Cardiovascular system: S1 & S2 heard, RRR. No JVD, No pedal edema. Gastrointestinal system: Abdomen is nondistended, soft and nontender.Normal bowel sounds heard. Central nervous system: Alert not following commands Extremities: Edematous lower extremities Skin: No rashes, lesions  Psychiatry: Cannot be assessed    Data Reviewed: I have personally reviewed following labs and imaging studies  CBC: Recent Labs  Lab 03/18/20 0352  WBC 12.7*  NEUTROABS 10.6*  HGB 8.7*  HCT 27.0*  MCV 98.9  PLT 347   Basic Metabolic Panel: Recent Labs  Lab 03/18/20 0352 03/19/20 0244 03/21/20 1012 03/22/20 0309  NA 142 142 138 136  K 2.9* 3.2* 2.8* 2.9*  CL 102 103 100 98  CO2 29 28 26 26   GLUCOSE 107* 100* 102* 96  BUN 24* 16 9 6*  CREATININE 0.82 0.86 0.76 0.77  CALCIUM 8.5* 8.3* 8.4* 8.1*  MG 2.1  --   --  1.7  PHOS 3.2  --   --  2.7   GFR: Estimated Creatinine Clearance: 103.1 mL/min (by C-G formula based on SCr of 0.77 mg/dL). Liver Function Tests: No results for input(s): AST, ALT, ALKPHOS, BILITOT, PROT, ALBUMIN in the last 168 hours. No results for input(s):  LIPASE, AMYLASE in the last 168 hours. No results for input(s): AMMONIA in the last 168 hours. Coagulation Profile: No results for input(s): INR, PROTIME in the last 168 hours. Cardiac Enzymes: No results for input(s): CKTOTAL, CKMB, CKMBINDEX, TROPONINI in the last 168 hours. BNP (last 3 results) No results for input(s): PROBNP in the last 8760 hours. HbA1C: No results for input(s): HGBA1C in the last 72 hours. CBG: Recent Labs  Lab 03/21/20 1135 03/21/20 1720 03/21/20 2135 03/21/20 2345 03/22/20 0321  GLUCAP 89 86 87 80 87   Lipid Profile: No results for input(s): CHOL, HDL, LDLCALC, TRIG, CHOLHDL, LDLDIRECT in the last 72 hours. Thyroid Function Tests: No results for input(s): TSH, T4TOTAL, FREET4, T3FREE, THYROIDAB in the last 72 hours. Anemia Panel: No results for input(s): VITAMINB12, FOLATE, FERRITIN, TIBC, IRON, RETICCTPCT in the last 72 hours. Sepsis Labs: No results for input(s): PROCALCITON, LATICACIDVEN in the last 168 hours.  No results found for this or any previous visit (from the past 240 hour(s)).       Radiology Studies: DG Chest Port 1 View  Result Date: 03/22/2020 CLINICAL  DATA:  Respiratory failure EXAM: PORTABLE CHEST 1 VIEW COMPARISON:  Four days ago FINDINGS: The endotracheal tube tip is in place. The left subclavian line has been removed. There is no edema, consolidation, effusion, or pneumothorax. Extensive artifact from EKG leads. IMPRESSION: No evidence of active disease.  Stable aeration. Electronically Signed   By: Monte Fantasia M.D.   On: 03/22/2020 06:33        Scheduled Meds: . chlorhexidine gluconate (MEDLINE KIT)  15 mL Mouth Rinse BID  . Chlorhexidine Gluconate Cloth  6 each Topical Q0600  . mouth rinse  15 mL Mouth Rinse 10 times per day  . pantoprazole  40 mg Intravenous Q12H  . scopolamine  1 patch Transdermal Q72H  . sodium chloride flush  10-40 mL Intracatheter Q12H   Continuous Infusions: . sodium chloride Stopped  (03/13/20 1943)  . sodium chloride    . dextrose 50 mL/hr at 03/21/20 1300  . potassium chloride 10 mEq (03/22/20 0946)     LOS: 15 days        Hosie Poisson, MD [facility]   To contact the attending provider between 7A-7P or the covering provider during after hours 7P-7A, please log into the web site [email or web address] and access using universal [place] password for that web site. If you do not have the password, please call the hospital operator.  03/22/2020, 10:33 AM   Addendum:  pts family would like to pursue PEG tube and LTAC  At this time.   Donata Reddick,MD

## 2020-03-22 NOTE — Progress Notes (Signed)
Patient withdrawn, refusing care. Mouths to the staff to leave him alone, shakes his head no and pulls away from staff. Education and emotional support provided. Will continue to monitor.

## 2020-03-22 NOTE — Progress Notes (Signed)
Responded to spiritual care consult for pastoral support. Pt was awake but not able to talk. No family present at this time. He did respond with head movement and eye contact. I offered Anand spiritual care with [facility]. I shared some encouraging words and momentary silence. Chaplain will follow up as needed.   Chaplain Resident Fidel Levy [phone]
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
## 2020-03-22 NOTE — Plan of Care (Signed)
  Problem: Activity: Goal: Ability to tolerate increased activity will improve Outcome: Progressing   Problem: Respiratory: Goal: Ability to maintain a clear airway and adequate ventilation will improve Outcome: Progressing   Problem: Role Relationship: Goal: Method of communication will improve Outcome: Progressing   Problem: Activity: Goal: Ability to tolerate increased activity will improve Outcome: Progressing   Problem: Respiratory: Goal: Ability to maintain a clear airway and adequate ventilation will improve Outcome: Progressing   Problem: Role Relationship: Goal: Method of communication will improve Outcome: Progressing   Problem: Education: Goal: Knowledge of General Education information will improve Description: Including pain rating scale, medication(s)/side effects and non-pharmacologic comfort measures Outcome: Progressing   Problem: Health Behavior/Discharge Planning: Goal: Ability to manage health-related needs will improve Outcome: Progressing   Problem: Clinical Measurements: Goal: Will remain free from infection Outcome: Progressing Goal: Diagnostic test results will improve Outcome: Progressing Goal: Respiratory complications will improve Outcome: Progressing Goal: Cardiovascular complication will be avoided Outcome: Progressing   Problem: Activity: Goal: Risk for activity intolerance will decrease Outcome: Progressing   Problem: Nutrition: Goal: Adequate nutrition will be maintained Outcome: Progressing   Problem: Coping: Goal: Level of anxiety will decrease Outcome: Progressing   Problem: Elimination: Goal: Will not experience complications related to bowel motility Outcome: Progressing Goal: Will not experience complications related to urinary retention Outcome: Progressing   Problem: Pain Managment: Goal: General experience of comfort will improve Outcome: Progressing   Problem: Safety: Goal: Ability to remain free from injury  will improve Outcome: Progressing   Problem: Skin Integrity: Goal: Risk for impaired skin integrity will decrease Outcome: Progressing

## 2020-03-23 ENCOUNTER — Inpatient Hospital Stay (HOSPITAL_COMMUNITY): Payer: Medicaid Other

## 2020-03-23 DIAGNOSIS — I63429 Cerebral infarction due to embolism of unspecified anterior cerebral artery: Secondary | ICD-10-CM | POA: Diagnosis not present

## 2020-03-23 DIAGNOSIS — Z66 Do not resuscitate: Secondary | ICD-10-CM | POA: Diagnosis not present

## 2020-03-23 DIAGNOSIS — R1310 Dysphagia, unspecified: Secondary | ICD-10-CM | POA: Diagnosis not present

## 2020-03-23 DIAGNOSIS — I469 Cardiac arrest, cause unspecified: Secondary | ICD-10-CM | POA: Diagnosis not present

## 2020-03-23 DIAGNOSIS — J9601 Acute respiratory failure with hypoxia: Secondary | ICD-10-CM | POA: Diagnosis not present

## 2020-03-23 LAB — CBC
HCT: 33.5 % — ABNORMAL LOW (ref 39.0–52.0)
Hemoglobin: 11 g/dL — ABNORMAL LOW (ref 13.0–17.0)
MCH: 31.6 pg (ref 26.0–34.0)
MCHC: 32.8 g/dL (ref 30.0–36.0)
MCV: 96.3 fL (ref 80.0–100.0)
Platelets: 470 10*3/uL — ABNORMAL HIGH (ref 150–400)
RBC: 3.48 MIL/uL — ABNORMAL LOW (ref 4.22–5.81)
RDW: 16.3 % — ABNORMAL HIGH (ref 11.5–15.5)
WBC: 14.3 10*3/uL — ABNORMAL HIGH (ref 4.0–10.5)
nRBC: 0 % (ref 0.0–0.2)

## 2020-03-23 LAB — GLUCOSE, CAPILLARY
Glucose-Capillary: 84 mg/dL (ref 70–99)
Glucose-Capillary: 81 mg/dL (ref 70–99)
Glucose-Capillary: 88 mg/dL (ref 70–99)
Glucose-Capillary: 82 mg/dL (ref 70–99)
Glucose-Capillary: 87 mg/dL (ref 70–99)
Glucose-Capillary: 90 mg/dL (ref 70–99)

## 2020-03-23 LAB — BASIC METABOLIC PANEL
Anion gap: 12 (ref 5–15)
BUN: <5 mg/dL — ABNORMAL LOW (ref 8–23)
CO2: 24 mmol/L (ref 22–32)
Calcium: 8.5 mg/dL — ABNORMAL LOW (ref 8.9–10.3)
Chloride: 100 mmol/L (ref 98–111)
Creatinine, Ser: 0.7 mg/dL (ref 0.61–1.24)
GFR calc Af Amer: >60 mL/min (ref >60)
GFR calc non Af Amer: >60 mL/min (ref >60)
Glucose, Bld: 89 mg/dL (ref 70–99)
Potassium: 3.1 mmol/L — ABNORMAL LOW (ref 3.5–5.1)
Sodium: 136 mmol/L (ref 135–145)

## 2020-03-23 MED ORDER — POTASSIUM CHLORIDE 10 MEQ/100ML IV SOLN
10.0000 meq | INTRAVENOUS | Status: AC
  Administered 2020-03-23 (×3): 10 meq via INTRAVENOUS
  Filled 2020-03-23 (×2): qty 100

## 2020-03-23 MED ORDER — DIPHENHYDRAMINE HCL 50 MG/ML IJ SOLN
25.0000 mg | Freq: Once | INTRAMUSCULAR | Status: AC
  Administered 2020-03-23: 25 mg via INTRAVENOUS
  Filled 2020-03-23: qty 1

## 2020-03-23 MED ORDER — GLYCOPYRROLATE 0.2 MG/ML IJ SOLN
0.1000 mg | Freq: Two times a day (BID) | INTRAMUSCULAR | Status: DC
  Administered 2020-03-23 – 2020-03-24 (×4): 0.1 mg via INTRAVENOUS
  Filled 2020-03-23 (×4): qty 1

## 2020-03-23 NOTE — Progress Notes (Signed)
Nutrition Follow-up  DOCUMENTATION CODES:   Not applicable  INTERVENTION:   After PEG placed, begin TF: Jevity 1.2 at 25 ml/h, increase by 10 ml every 8 hours to goal rate of 75 ml/h. Pro-stat 30 ml once daily.  Provides 2260 kcal, 115 gm protein, 1458 ml free water daily.  Will require free water flushes 200 ml QID for a total of 2258 ml per day to meet fluid needs.  Monitor magnesium, potassium, and phosphorus BID for at least 2 days, MD to replete as needed, as pt is at risk for refeeding syndrome given minimal kcal intake for the past 10 days.  NUTRITION DIAGNOSIS:   Inadequate oral intake related to inability to eat as evidenced by NPO status.  Ongoing   GOAL:   Patient will meet greater than or equal to 90% of their needs  Unmet  MONITOR:   Vent status, Labs, I & O's  ASSESSMENT:   64 yo male admitted S/P cardiac arrest, S/P normothermia protocol. Found to have massive PE, DVT, GI bleed from gastric ulcer. PMH includes current smoker, heavy alcohol use.  Patient has been on trach collar since 4/4. Marland Kitchen  Palliative Care team is following. Niece has decided that she would like for patient to have a PEG for nutrition.   SLP is following for PMV use. Patient is not yet ready for swallow assessment.  Per review of flow sheets, TPN was stopped on 3/29 and patient has not received any enteral or parenteral nutrition since then, except IVF of D5 at 50 ml/h. Patient has received multiple IV runs of KCl over the past week.   PEG tube has been ordered. CT abdomen 4/8 revealed appropriate gastric anatomy for PEG tube placement.    When TF is started, will be at risk for refeeding syndrome due to minimal nutrition for the past 10 days. Will advance TF slowly and check mag, phos, and potassium BID x at least 2 days.   Labs reviewed. K 3.1 (L) CBG's: 78-84-81  Medications reviewed. IVF: D5 at 50 ml/h  Admission weight 73.6 kg Currently 77.8 kg  Diet Order:   Diet Order             Diet NPO time specified  Diet effective midnight              EDUCATION NEEDS:   Not appropriate for education at this time  Skin:  Skin Assessment: Reviewed RN Assessment(incisions to neck and groin)  Last BM:  4/7 type 6  Height:   Ht Readings from Last 1 Encounters:  03/07/20 6\' 2"  (1.88 m)    Weight:   Wt Readings from Last 1 Encounters:  03/23/20 77.8 kg    Ideal Body Weight:  86.4 kg  BMI:  Body mass index is 22.02 kg/m.  Estimated Nutritional Needs:   Kcal:  2100-2300  Protein:  110-125 gm  Fluid:  >/= 2 L    Molli Barrows, RD, LDN, CNSC Please refer to Amion for contact information.

## 2020-03-23 NOTE — Progress Notes (Signed)
PROGRESS NOTE    Kenneth Sullivan  PVX:[id number] DOB: 1956-05-08 DOA: 03/07/2020 PCP: Medicine, [name]  Brief Narrative:  64 year old gentleman with no significant past medical history presents with shortness of breath and hypoxic had a witnessed bradycardic asystolic arrest underwent normothermia protocol was found to have significant massive PE, DVT with RV strain and acute GI bleed from gastric ulcer.  Patient was admitted on March 07 2020.  When EGD showing oozing gastric ulcer with clot.  He underwent IR guided mechanical thrombectomy, IVC filter placement and GDA embolization.  His hospital course was complicated by multifocal acute to subacute ischemic infarcts involving bilateral cerebral hemispheres consistent with global hypoperfusion related to cardiac arrest associated with scattered petechial hemorrhages with evidence of hemorrhagic conversion in the right parietal lobe.patient is more alert today and trying to mouth words. Profuse secretions from the trach and mouth.   Assessment & Plan:   Principal Problem:   Cardiac arrest [facility]) Active Problems:   Encounter for central line placement   GI bleed   Acute respiratory failure (H[facility])   Anoxic encephalopathy ([facility])   Cerebral embolism with cerebral infarction   Pulmonary emboli ([place])   Palliative care by specialist   Goals of care, counseling/discussion   DNR (do not resuscitate)   Cardiac arrest secondary to massive pulmonary embolism S/p  3 rounds of epi, 20 min to ROSC, underwent normothermic protocol.  MRI of the brain shows multiple acute to subacute ischemic infarcts involving bilateral cerebral hemispheres consistent with global hypoperfusion related to cardiac arrest.  S/p IVC filter placement Continue supportive care. Pt denes any chest pain or sob.    Anoxic encephalopathy CVAs with hemorrhagic conversion MRI showing multiple acute and subacute infarcts with evidence of hemorrhagic conversion Poor  prognosis for any meaningful recovery. Palliative care consulted and goals of care discussion in progress. Family ( his niece) would like to pursue  feeding tubes and  LTAC placement       Acute respiratory failure with hypoxia secondary to massive PE with cor pulmonale S/p intubation on admission and s/p trach placement on 03/20/2020.  S/p mechanical thrombectomy by IR and IVC filter placement for the acute DVT, unable to anti coagulate due to hemorrhagic conversion of the multiple infarcts.  Currently on trach collar and able to tolerate Continue with pulmonary hygiene. scopolamine patch for upper respiratory trach secretions.  Will request PCCM to change the trach to cuffless in am.  Add robinol for copious secretions.  Speech evaluation recommending  Passy muir Speech valve onl with SLP with full supervision.       Acute GI bleed secondary to large gastric ulcer S/p GDA embolization. Resume PPI.     Nutrition: malnourished.  Plan for PEG placement.  IR consulted and CT ABD AND PELVIS independently reviewed  Persistent hypokalemia Replaced.     DVT prophylaxis: SCDs Code Status: DNR Family Communication: None at bedside Disposition Plan:  . Patient came from: Home           . Anticipated d/c place: Pending . Barriers to d/c OR conditions which need to be met to effect a safe d/c: Pending palliative care discussion with the family   Consultants:   Gastroenterology  Cardiology  IR  Neurology  Procedures:  CTA 3/23 >> bilateral PE with RV/with ratio 1.9, emphysema CT head 3/23 >> chronic microvascular changes.  No acute findings Echo 3/23 >> RV is severely dilated with reduced function and D-shaped septum, LVEF 60-65%. LE Venous Duplex 3/24 >>  acute DVT in BLE up to the femoral vein  MRI Brain 3/28 >> multifocal acute to subacute ischemic infarcts involving bilateral cerebral hemispheres, findings consistent with global hypoperfusion event related to cardiac  arrest, associated scattered petechial hemorrhage about many areas of ischemia, evidence of hemorrhagic conversion in the right parietal lobe. ETT 3/23 >>4/5 Trach 4/5>> L TLC 3/23 >>  ALine L Fem 3/23 >> 3/30    Antimicrobials: None   Subjective: Not in distress. Copious secretions.   Objective: Vitals:   03/23/20 0431 03/23/20 0719 03/23/20 1256 03/23/20 1605  BP:  (!) 122/91 (!) 124/94 137/87  Pulse: 88 89 (!) 25 88  Resp: (!) 31 (!) 23 (!) 21 15  Temp:  97.8 F (36.6 C) 98.3 F (36.8 C) 99.5 F (37.5 C)  TempSrc:  Axillary Axillary Axillary  SpO2: 92% 100% 95% 100%  Weight:      Height:        Intake/Output Summary (Last 24 hours) at 03/23/2020 1815 Last data filed at 03/23/2020 1636 Gross per 24 hour  Intake 1598.46 ml  Output 700 ml  Net 898.46 ml   Filed Weights   03/19/20 0500 03/20/20 0500 03/23/20 0342  Weight: 76 kg 76.1 kg 77.8 kg    Examination:  General exam: Ill-appearing gentleman cachectic, s/p trach, not in any kind of distress respiratory system: diminished air entry at bases, tachypnea  present.  Cardiovascular system: S1S2, RRR, no JVD.  Gastrointestinal system: abd is soft, non tender nondistended. Bowel sounds wnl.  Central nervous system: alert , responding with yes and no.  Extremities: no cyanosis.  Skin: excoriations on the back.  Psychiatry: Cannot be assessed    Data Reviewed: I have personally reviewed following labs and imaging studies  CBC: Recent Labs  Lab 03/18/20 0352 03/23/20 0945  WBC 12.7* 14.3*  NEUTROABS 10.6*  --   HGB 8.7* 11.0*  HCT 27.0* 33.5*  MCV 98.9 96.3  PLT 321 370*   Basic Metabolic Panel: Recent Labs  Lab 03/18/20 0352 03/19/20 0244 03/21/20 1012 03/22/20 0309 03/23/20 0945  NA 142 142 138 136 136  K 2.9* 3.2* 2.8* 2.9* 3.1*  CL 102 103 100 98 100  CO2 _0 GLUCOSE 107* 100* 102* 96 89  BUN 24* 16 9 6* <5*  CREATININE 0.82 0.86 0.76 0.77 0.70  CALCIUM 8.5* 8.3* 8.4* 8.1*  8.5*  MG 2.1  --   --  1.7  --   PHOS 3.2  --   --  2.7  --    GFR: Estimated Creatinine Clearance: 105.4 mL/min (by C-G formula based on SCr of 0.7 mg/dL). Liver Function Tests: No results for input(s): AST, ALT, ALKPHOS, BILITOT, PROT, ALBUMIN in the last 168 hours. No results for input(s): LIPASE, AMYLASE in the last 168 hours. No results for input(s): AMMONIA in the last 168 hours. Coagulation Profile: No results for input(s): INR, PROTIME in the last 168 hours. Cardiac Enzymes: No results for input(s): CKTOTAL, CKMB, CKMBINDEX, TROPONINI in the last 168 hours. BNP (last 3 results) No results for input(s): PROBNP in the last 8760 hours. HbA1C: No results for input(s): HGBA1C in the last 72 hours. CBG: Recent Labs  Lab 03/22/20 2342 03/23/20 0339 03/23/20 0713 03/23/20 1243 03/23/20 1634  GLUCAP 78 84 81 88 82   Lipid Profile: No results for input(s): CHOL, HDL, LDLCALC, TRIG, CHOLHDL, LDLDIRECT in the last 72 hours. Thyroid Function Tests: No results for input(s): TSH, T4TOTAL, FREET4, T3FREE, THYROIDAB in  the last 72 hours. Anemia Panel: No results for input(s): VITAMINB12, FOLATE, FERRITIN, TIBC, IRON, RETICCTPCT in the last 72 hours. Sepsis Labs: No results for input(s): PROCALCITON, LATICACIDVEN in the last 168 hours.  No results found for this or any previous visit (from the past 240 hour(s)).       Radiology Studies: CT ABDOMEN WO CONTRAST  Result Date: 03/23/2020 CLINICAL DATA:  Evaluate anatomy prior to potential percutaneous gastrostomy tube placement. History of GI bleeding requiring mesenteric arteriogram and embolization. EXAM: CT ABDOMEN WITHOUT CONTRAST TECHNIQUE: Multidetector CT imaging of the abdomen was performed following the standard protocol without IV contrast. COMPARISON:  Chest CT-03/07/2020 FINDINGS: Lower chest: Limited visualization of the lower thorax demonstrates development of a trace right-sided pleural effusion with associated right  basilar consolidative opacities. Minimal subpleural opacities are seen within the imaged left lower lobe. Normal heart size. Diffuse decreased attenuation of the intra cardiac blood pool suggestive of anemia. Hepatobiliary: Normal hepatic contour. Suspected focal fatty sparing adjacent to the fissure for ligamentum teres, incompletely evaluated on this noncontrast examination. The gallbladder is underdistended. No radiopaque gallstones. No ascites. Pancreas: Normal noncontrast appearance of the pancreas. Spleen: The spleen appears diminutive. Adrenals/Urinary Tract: Grossly unchanged appearance of previously characterized approximately 7.9 x 7.0 cm exophytic cyst arising from the superior pole the left kidney. No discrete right-sided renal lesions on this noncontrast examination. No renal stones or urinary obstruction. Normal noncontrast appearance the bilateral adrenal glands. The urinary bladder was not imaged. Stomach/Bowel: The anterior wall the stomach is well apposed against the ventral wall of the upper abdomen without interposition of the hepatic parenchyma, large or small bowel. Embolization coils are noted about the gastric antrum and proximal duodenum. Nonobstructive bowel gas pattern. No pneumoperitoneum, pneumatosis or portal venous gas. Vascular/Lymphatic: Minimal amount of atherosclerotic plaque within normal caliber abdominal aorta. Post IVC filter placement. No bulky retroperitoneal or mesenteric lymphadenopathy. Other: Diffuse body wall anasarca. Musculoskeletal: No acute or aggressive osseous abnormalities. Mild (approximately 25%) compression deformities involving the T11 and T12 vertebral bodies appear unchanged compared to the 03/07/2020 chest CT. IMPRESSION: 1. Gastric anatomy amenable to potential percutaneous gastrostomy tube placement as indicated. 2. Embolization coils adjacent to the gastric antrum and proximal duodenum. 3. Post IVC filter placement. 4.  Aortic Atherosclerosis  (ICD10-I70.0). Electronically Signed   By: Sandi Mariscal M.D.   On: 03/23/2020 09:30   DG Chest Port 1 View  Result Date: 03/22/2020 CLINICAL DATA:  Respiratory failure EXAM: PORTABLE CHEST 1 VIEW COMPARISON:  Four days ago FINDINGS: The endotracheal tube tip is in place. The left subclavian line has been removed. There is no edema, consolidation, effusion, or pneumothorax. Extensive artifact from EKG leads. IMPRESSION: No evidence of active disease.  Stable aeration. Electronically Signed   By: Monte Fantasia M.D.   On: 03/22/2020 06:33        Scheduled Meds: . chlorhexidine gluconate (MEDLINE KIT)  15 mL Mouth Rinse BID  . Chlorhexidine Gluconate Cloth  6 each Topical Q0600  . glycopyrrolate  0.1 mg Intravenous BID  . mouth rinse  15 mL Mouth Rinse 10 times per day  . pantoprazole  40 mg Intravenous Q12H  . scopolamine  1 patch Transdermal Q72H  . sodium chloride flush  10-40 mL Intracatheter Q12H   Continuous Infusions: . sodium chloride Stopped (03/13/20 1943)  . sodium chloride    . dextrose 50 mL/hr at 03/21/20 1300  . potassium chloride 10 mEq (03/23/20 1744)     LOS: 16 days  Hosie Poisson, MD [facility]   To contact the attending provider between 7A-7P or the covering provider during after hours 7P-7A, please log into the web site [email or web address] and access using universal [place] password for that web site. If you do not have the password, please call the hospital operator.  03/23/2020, 6:15 PM   Addendum:  pts family would like to pursue PEG tube and LTAC  At this time.   Thomasine Klutts,MD

## 2020-03-23 NOTE — Plan of Care (Signed)
  Problem: Activity: Goal: Ability to tolerate increased activity will improve Outcome: Progressing   Problem: Respiratory: Goal: Ability to maintain a clear airway and adequate ventilation will improve Outcome: Progressing   Problem: Role Relationship: Goal: Method of communication will improve Outcome: Progressing   Problem: Activity: Goal: Ability to tolerate increased activity will improve Outcome: Progressing   Problem: Respiratory: Goal: Ability to maintain a clear airway and adequate ventilation will improve Outcome: Progressing

## 2020-03-23 NOTE — Progress Notes (Signed)
  Speech Language Pathology Treatment: [name] Speaking valve  Patient Details Name: Kenneth Sullivan MRN: [id number] DOB: 11-Jun-1956 Today's Date: 03/23/2020 Time: 1537-1600 SLP Time Calculation (min) (ACUTE ONLY): 23 min  Assessment / Plan / Recommendation Clinical Impression  Pt seen for use of Passy-Muir speaking valve and needs encouragement during session as he is very frustrated. Copious oral and tracheal secretions- he does seem to be clearing saliva more than Tuesday. Valve placed in intervals up to 30 seconds given back pressure and amount of secretions. When valve placed multiple cues given to cough/throat clear which he could/would not do. Frequently grimaces- noted to have lingual candidias. He was mouthing in phrases but did not follow cues to slow rate. Once he did effectively increase intensity when frustrated significantly improving intelligibility. He was attempting to tell this therapist something about his father. He did not verbally spell word when asked and hands appear too weak for writing- may attempt this session. Spoke with MD re: secretions who is aware and stated she would try meds to assist in reducing. Vitals were within normal range. If secretions are decreased can likely achieve phonation and decrease frustration. Can trach be changed to cuffless? (remains deflated.    HPI HPI: 64 year old admitted 3/23 with shortness of breath with sats in the 40s on with witnessed bradycardic, asystolic arrest when EMS was present with 3 rounds of epi, 20 minutes to ROSC per chart. Found to also have bilateral PR's and had GI bleed following arrest. MRI showed multifocal acute-subactue ischemic infarcts c/w global hypoperfusion. Intubated 3/23 adn trach placed 4/2. CXR 4/3 no active disease. No evidence of pneumonia or pulmonary edema. Per chart pt smokes 1 pack/day, consumes 2-3 forty oz beers daily, + marijuana.      SLP Plan  Continue with current plan of care        Recommendations         Patient may use Passy-Muir Speech Valve: with SLP only PMSV Supervision: Full MD: Please consider changing trach tube to : Cuffless         SLP Visit Diagnosis: Aphonia (R49.1) Plan: Continue with current plan of care                       Houston Siren 03/23/2020, 4:14 PM [name] Colvin Caroli.Ed Risk analyst [phone] Office [phone]

## 2020-03-23 NOTE — Consult Note (Signed)
Chief Complaint: Patient was seen in consultation today for percutaneous gastric tube placement Chief Complaint  Patient presents with  . Cardiac Arrest   at the request of Dr Venia Minks  Supervising Physician: Corrie Mckusick  Patient Status: [facility] - In-pt  History of Present Illness: Kenneth Sullivan is a 64 y.o. male   Admitted 03/07/20 Massive PE-- cardiac arrest and GI bleed IR performed mechanical thrombectomy of PE; IVC filter and GDA embolization 03/07/20  Pt has suffered Anoxic brain injury Resultant:  Dysphagia; Malnutrition; Debilitation  Request for percutaneous gastric tube placement per [facility]  Note from Palliative note yesterday:  Kenneth Sullivan requesting PEG placement and is in agreement with LTAC placment to allow patient every opportunity to improve. Detailed discussion and education regarding artificial feeding risk/benefits  Scheduled now in IR asap We will pan for early next week G tubes are not availible now Chiropractor from producer)  Past Medical History:  Diagnosis Date  . Cardiac arrest ([place]) 03/07/2020  . Known health problems: none    No recent medical care, has not had a physical in years    Past Surgical History:  Procedure Laterality Date  . ESOPHAGOGASTRODUODENOSCOPY (EGD) WITH PROPOFOL N/A 03/07/2020   Procedure: ESOPHAGOGASTRODUODENOSCOPY (EGD) WITH PROPOFOL;  Surgeon: Wilford Corner, MD;  Location: [facility];  Service: Endoscopy;  Laterality: N/A;  . IR ANGIOGRAM PULMONARY BILATERAL SELECTIVE  03/07/2020  . IR ANGIOGRAM SELECTIVE EACH ADDITIONAL VESSEL  03/07/2020  . IR ANGIOGRAM SELECTIVE EACH ADDITIONAL VESSEL  03/07/2020  . IR ANGIOGRAM SELECTIVE EACH ADDITIONAL VESSEL  03/07/2020  . IR ANGIOGRAM VISCERAL SELECTIVE  03/07/2020  . IR EMBO ART  VEN HEMORR LYMPH EXTRAV  INC GUIDE ROADMAPPING  03/07/2020  . IR IVC FILTER PLMT / S&I /IMG GUID/MOD SED  03/07/2020  . IR THROMBECT PRIM MECH INIT (INCLU) MOD SED  03/07/2020  . IR US GUIDE VASC ACCESS RIGHT   03/07/2020  . IR US GUIDE VASC ACCESS RIGHT  03/07/2020  . IR US GUIDE VASC ACCESS RIGHT  03/07/2020    Allergies: Patient has no known allergies.  Medications: Prior to Admission medications   Not on File     Family History  Problem Relation Age of Onset  . Heart attack Neg Hx     Social History   Socioeconomic History  . Marital status: Single    Spouse name: Not on file  . Number of children: Not on file  . Years of education: Not on file  . Highest education level: Not on file  Occupational History  . Not on file  Tobacco Use  . Smoking status: Current Every Day Smoker    Packs/day: 1.00    Types: Cigarettes  . Smokeless tobacco: Never Used  Substance and Sexual Activity  . Alcohol use: Yes    Alcohol/week: 21.0 standard drinks    Types: 21 Cans of beer per week    Comment: 2 or 3  -40 ounce beers a day  . Drug use: Yes    Types: Marijuana  . Sexual activity: Not on file  Other Topics Concern  . Not on file  Social History Narrative   Lives with wife.  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   Social Determinants of Health   Financial Resource Strain:   . Difficulty of Paying Living Expenses:   Food Insecurity:   . Worried About Charity fundraiser in the Last Year:   . [place] in the Last Year:  Transportation Needs:   . Film/video editor (Medical):   Marland Kitchen Lack of Transportation (Non-Medical):   Physical Activity:   . Days of Exercise per Week:   . Minutes of Exercise per Session:   Stress:   . Feeling of Stress :   Social Connections:   . Frequency of Communication with Friends and Family:   . Frequency of Social Gatherings with Friends and Family:   . Attends Religious Services:   . Active Member of Clubs or Organizations:   . Attends Archivist Meetings:   Marland Kitchen Marital Status:     Review of Systems: A 12 point ROS discussed and pertinent positives are indicated in the HPI above.  All other systems are negative.    Vital Signs: BP (!) 122/91   Pulse 89   Temp 97.8 F (36.6 C) (Axillary)   Resp (!) 23   Ht _0  (1.88 m)   Wt 171 lb 8.3 oz (77.8 kg)   SpO2 100%   BMI 22.02 kg/m   Physical Exam Vitals reviewed.  Cardiovascular:     Rate and Rhythm: Normal rate and regular rhythm.     Heart sounds: Normal heart sounds.  Pulmonary:     Breath sounds: Normal breath sounds.  Abdominal:     Palpations: Abdomen is soft.  Skin:    General: Skin is warm and dry.  Neurological:     Mental Status: Mental status is at baseline.  Psychiatric:     Comments: Will cal Niece Kenneth Sullivan for consent NA      Imaging: CT ABDOMEN WO CONTRAST  Result Date: 03/23/2020 CLINICAL DATA:  Evaluate anatomy prior to potential percutaneous gastrostomy tube placement. History of GI bleeding requiring mesenteric arteriogram and embolization. EXAM: CT ABDOMEN WITHOUT CONTRAST TECHNIQUE: Multidetector CT imaging of the abdomen was performed following the standard protocol without IV contrast. COMPARISON:  Chest CT-03/07/2020 FINDINGS: Lower chest: Limited visualization of the lower thorax demonstrates development of a trace right-sided pleural effusion with associated right basilar consolidative opacities. Minimal subpleural opacities are seen within the imaged left lower lobe. Normal heart size. Diffuse decreased attenuation of the intra cardiac blood pool suggestive of anemia. Hepatobiliary: Normal hepatic contour. Suspected focal fatty sparing adjacent to the fissure for ligamentum teres, incompletely evaluated on this noncontrast examination. The gallbladder is underdistended. No radiopaque gallstones. No ascites. Pancreas: Normal noncontrast appearance of the pancreas. Spleen: The spleen appears diminutive. Adrenals/Urinary Tract: Grossly unchanged appearance of previously characterized approximately 7.9 x 7.0 cm exophytic cyst arising from the superior pole the left kidney. No discrete right-sided renal lesions on this  noncontrast examination. No renal stones or urinary obstruction. Normal noncontrast appearance the bilateral adrenal glands. The urinary bladder was not imaged. Stomach/Bowel: The anterior wall the stomach is well apposed against the ventral wall of the upper abdomen without interposition of the hepatic parenchyma, large or small bowel. Embolization coils are noted about the gastric antrum and proximal duodenum. Nonobstructive bowel gas pattern. No pneumoperitoneum, pneumatosis or portal venous gas. Vascular/Lymphatic: Minimal amount of atherosclerotic plaque within normal caliber abdominal aorta. Post IVC filter placement. No bulky retroperitoneal or mesenteric lymphadenopathy. Other: Diffuse body wall anasarca. Musculoskeletal: No acute or aggressive osseous abnormalities. Mild (approximately 25%) compression deformities involving the T11 and T12 vertebral bodies appear unchanged compared to the 03/07/2020 chest CT. IMPRESSION: 1. Gastric anatomy amenable to potential percutaneous gastrostomy tube placement as indicated. 2. Embolization coils adjacent to the gastric antrum and proximal duodenum. 3. Post IVC filter placement. 4.  Aortic Atherosclerosis (ICD10-I70.0). Electronically Signed   By: Sandi Mariscal M.D.   On: 03/23/2020 09:30   CT HEAD WO CONTRAST  Result Date: 03/14/2020 CLINICAL DATA:  Stroke follow-up EXAM: CT HEAD WITHOUT CONTRAST TECHNIQUE: Contiguous axial images were obtained from the base of the skull through the vertex without intravenous contrast. COMPARISON:  Brain MRI from 2 days ago FINDINGS: Brain: Numerous acute infarcts most confluent in the bilateral cerebellum and occipital lobes. There is hemorrhagic conversion with hematomas in the bilateral occipital parietal region with fluid hematocrit levels best seen on the right by MRI. Hemorrhagic area on axial slices at the area of maximal hemorrhage measures up to 3 cm. Petechial type hemorrhage seen at a left cerebellar and left posterior  frontal infarct. No hydrocephalus or shift. Vascular: Negative Skull: Negative Sinuses/Orbits: Nasopharyngeal fluid in the setting of intubation IMPRESSION: Extensive acute infarction with multifocal hemorrhagic conversion that is stable from 03/12/2020. Electronically Signed   By: Monte Fantasia M.D.   On: 03/14/2020 08:02   CT Head Wo Contrast  Result Date: 03/07/2020 CLINICAL DATA:  64 year old male with history of altered mental status. EXAM: CT HEAD WITHOUT CONTRAST TECHNIQUE: Contiguous axial images were obtained from the base of the skull through the vertex without intravenous contrast. COMPARISON:  No priors. FINDINGS: Brain: Patchy areas of decreased attenuation are noted throughout the deep and periventricular white matter of the cerebral hemispheres bilaterally, compatible with chronic microvascular ischemic disease. no evidence of acute infarction, hemorrhage, hydrocephalus, extra-axial collection or mass lesion/mass effect. Vascular: No hyperdense vessel or unexpected calcification. Skull: Normal. Negative for fracture or focal lesion. Sinuses/Orbits: No acute finding. Other: Endotracheal and nasogastric tubes are incompletely imaged. IMPRESSION: 1. No acute intracranial abnormalities. 2. Mild chronic microvascular ischemic changes in the cerebral white matter, as above. Electronically Signed   By: Vinnie Langton M.D.   On: 03/07/2020 10:56   CT ANGIO CHEST PE W OR WO CONTRAST  Result Date: 03/07/2020 CLINICAL DATA:  Cardiac arrest, intubated EXAM: CT ANGIOGRAPHY CHEST WITH CONTRAST TECHNIQUE: Multidetector CT imaging of the chest was performed using the standard protocol during bolus administration of intravenous contrast. Multiplanar CT image reconstructions and MIPs were obtained to evaluate the vascular anatomy. CONTRAST:  80 cc OMNIPAQUE IOHEXOL 350 MG/ML SOLN COMPARISON:  03/07/2020 FINDINGS: Cardiovascular: This is a technically adequate evaluation of the pulmonary vasculature. There  are bilateral pulmonary emboli right greater than left. There is straightening of the interventricular septum and dilatation of the right ventricle compatible with right heart strain. 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 The thoracic aorta demonstrates normal caliber with no aneurysm or dissection. No pericardial effusion. Mediastinum/Nodes: No enlarged mediastinal, hilar, or axillary lymph nodes. Thyroid gland, trachea, and esophagus demonstrate no significant findings. Endotracheal tube identified well above carina. Enteric catheter extends into the gastric lumen. Lungs/Pleura: Scattered upper lobe predominant emphysematous changes are seen. Patchy subpleural consolidation superior segment right lower lobe is nonspecific. No effusion or pneumothorax. Mild lower lobe predominant bronchiectasis. The central airways are patent. Upper Abdomen: Rounded cystic structure left upper quadrant likely partially visualized renal cyst. This measures up to 7.3 cm in diameter. Musculoskeletal: No acute or destructive bony lesions. Reconstructed images demonstrate no additional findings. Review of the MIP images confirms the above findings. IMPRESSION: 1. Bilateral pulmonary emboli. Positive for acute PE with CT evidence of right heart strain (RV/LV Ratio = 1.9) consistent with at least submassive (intermediate risk) PE. The presence of right heart  strain has been associated with an increased risk of morbidity and mortality. 2. Emphysema.  Mild bronchiectasis bilaterally. 3. Support devices as above. 4. 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 These results were called by telephone at the time of interpretation on 03/07/2020 at 11:16 a.m. to provider [facility] , who verbally acknowledged these results. Electronically Signed   By: Randa Ngo M.D.   On: 03/07/2020 11:20   MR BRAIN WO  CONTRAST  Result Date: 03/12/2020 CLINICAL DATA:  Initial evaluation for anoxic brain injury. History of asystolic arrest. EXAM: MRI HEAD WITHOUT CONTRAST TECHNIQUE: Multiplanar, multiecho pulse sequences of the brain and surrounding structures were obtained without intravenous contrast. COMPARISON:  Prior head CT from 03/07/2020. FINDINGS: Brain: Generalized age-related cerebral atrophy. Multiple and extensive scattered foci of restricted diffusion seen involving the bilateral cerebral hemispheres, right slightly worse than left, and most pronounced posteriorly within the parieto-occipital regions. Infarcts are largely watershed in distribution. Patchy involvement of the left greater than right basal ganglia and left thalamus. Additional scattered ischemic changes within the cerebellar hemispheres bilaterally. Relative sparing of the brainstem. Findings felt to be most consistent with a global hypoperfusion insult related to recent cardiac arrest. Multiple scattered foci of susceptibility artifacts seen associated with several of these areas of ischemia, consistent with petechial hemorrhage. Additionally, there is hemorrhagic transformation with and intraparenchymal hematoma seen at the right parietal lobe measuring 3.6 x 3.0 x 5.3 cm (series 16, image 37). Associated internal fluid-fluid levels with probable layering hematocrit. Additional parenchymal hematoma seen inferiorly at the left occipital pole measuring 11 mm (series 16, image 26). Mild localized edema about these hemorrhages without significant regional mass effect. No mass lesion or midline shift. No hydrocephalus or extra-axial fluid collection. Pituitary gland suprasellar region normal. Midline structures intact. Vascular: Major intracranial vascular flow voids are maintained. No findings to suggest dural sinus thrombosis. Skull and upper cervical spine: Craniocervical junction within normal limits. Bone marrow signal intensity normal. No scalp soft  tissue abnormality. Sinuses/Orbits: Globes and orbital soft tissues within normal limits. Paranasal sinuses are clear. Fluid seen layering within the nasopharynx. Patient likely intubated. Trace bilateral mastoid effusions. Other: None. IMPRESSION: 1. Multifocal acute to early subacute ischemic infarcts involving the bilateral cerebral and cerebellar hemispheres as above. Findings felt to be most consistent with a global hypoperfusion event related to recent cardiac arrest. Sequelae of PRES would be the primary differential consideration. 2. Associated scattered petechial hemorrhage about many of these areas of ischemia. Evidence for hemorrhagic transformation at the right parietal lobe with 3.6 x 3.0 x 5.3 cm cortically based intraparenchymal hematoma. Additional smaller 11 mm hematoma at the left occipital pole. Associated mild localized edema without significant regional mass effect or midline shift. 3. Generalized age-related cerebral atrophy. Critical Value/emergent results were called by telephone at the time of interpretation on 03/12/2020 at 12:53 am to provider Dr. Lucile Shutters, who verbally acknowledged these results. Electronically Signed   By: Jeannine Boga M.D.   On: 03/12/2020 00:45   IR Angiogram Visceral Selective  Result Date: 03/08/2020 INDICATION: 64 year old male with cardiac arrest, massive PE diagnosis, GI hemorrhage with large gastric ulcer, anti-coagulation is contraindicated. He presents for mesenteric angiogram and possible embolization. Same session PE aspiration thrombectomy. EXAM: US GUIDED ACCESS RIGHT COMMON FEMORAL ARTERY MESENTERIC ANGIOGRAM, CELIAC ARTERY, RIGHT GASTRIC ARTERY, GASTRODUODENAL ARTERY COIL EMBOLIZATION OF RIGHT GASTRIC ARTERY AND GDA EXOSEAL DEPLOYED FOR HEMOSTASIS MEDICATIONS: None ANESTHESIA/SEDATION: Moderate (conscious) sedation was employed  during this procedure. A total of Versed 0 mg and Fentanyl 100 mcg was administered intravenously. Moderate Sedation Time:  2 hours 24 minutes minutes. The patient's level of consciousness and vital signs were monitored continuously by radiology nursing throughout the procedure under my direct supervision. CONTRAST:  330 cc IV contrast. This total reflects the total amount of contrast for 3 separate cases under 1 anesthesia time. FLUOROSCOPY TIME:  Fluoroscopy Time: 31 minutes 12 seconds (1,144 mGy). This fluoroscopy time reflects 3 separate cases performed. COMPLICATIONS: None PROCEDURE: Informed consent was obtained from the patient following explanation of the procedure, risks, benefits and alternatives. The patient understands, agrees and consents for the procedure. All questions were addressed. A time out was performed prior to the initiation of the procedure. Maximal barrier sterile technique utilized including caps, mask, sterile gowns, sterile gloves, large sterile drape, hand hygiene, and Betadine prep. Once the IVC filter was placed and mechanical thrombectomy of the pulmonary arteries was performed, we proceeded with mesenteric angiogram and possible embolization. Ultrasound survey of the right inguinal region was performed with images stored and sent to PACs, confirming patency of the vessel. A micropuncture needle was used access the right common femoral artery under ultrasound. With excellent arterial blood flow returned, and an .018 micro wire was passed through the needle, observed enter the abdominal aorta under fluoroscopy. The needle was removed, and a micropuncture sheath was placed over the wire. The inner dilator and wire were removed, and an 035 Bentson wire was advanced under fluoroscopy into the abdominal aorta. The sheath was removed and a standard 5 [place] vascular sheath was placed. The dilator was removed and the sheath was flushed. A standard C2 Cobra catheter was passed on the Bentson wire used to select the origin of the celiac artery. Celiac artery origin anatomy did not except a good purchase of these  Cobra catheter, and thus the Cobra catheter was exchanged for a Mickelson catheter. Mickelson catheter achieved adequate purchase of the celiac artery origin. Angiogram was performed. 150 cm STC catheter was advanced with a 14 fathom wire, used to select the common hepatic artery. Fathom wire was advanced into the right gastric artery with the Union catheter following easily. Angiogram was performed. Coil embolization of the right gastric artery was performed with 2 by 2 mm coils The catheter was then withdrawn and wire was used to select the gastroduodenal artery. Catheter was advanced into the gastroduodenal artery. Angiogram was performed. Coil embolization was then performed of the GDA with combination of 5 mm coils. Final angiogram performed. We then withdrew all catheters wires and deployed Exoseal for hemostasis. Patient remained hemodynamically stable throughout. No complications were encountered and no significant blood loss from this portion of the examination. IMPRESSION: Status post ultrasound guided access right common femoral artery for mesenteric angiogram and empiric coil embolization of right gastric artery and gastroduodenal artery, as the preferential arterial supply to the anatomic location of large gastric ulcer. Signed, Dulcy Fanny. Dellia Nims, RPVI [facility] Electronically Signed   By: Corrie Mckusick D.O.   On: 03/08/2020 09:33   IR Angiogram Pulmonary Bilateral Selective  Result Date: 03/08/2020 INDICATION: 64 year old male with cardiac arrest, massive PE diagnosis, GI hemorrhage with large gastric ulcer, anti-coagulation is contraindicated. He presents for attempt at mechanical/aspiration thrombectomy. We then have plans to proceed with mesenteric angiogram and embolization, and filter placement EXAM: US GUIDED ACCESS OF RIGHT IJ VEIN PULMONARY ANGIOGRAM ASPIRATION/MECHANICAL THROMBECTOMY OF PULMONARY EMBOLUS COMPARISON:  CT  03/07/2020 MEDICATIONS: None. ANESTHESIA/SEDATION: Versed 0 mg IV; Fentanyl 100 mcg IV Moderate Sedation Time:  2 hours 24 minutes The patient was continuously monitored during the procedure by the interventional radiology nurse under my direct supervision. FLUOROSCOPY TIME:  Fluoroscopy Time: 31 minutes 12 seconds (1,144 mGy). This fluoroscopy time reflects 3 separate cases performed at the same event COMPLICATIONS: None TECHNIQUE: Informed written consent was obtained from the patient's family after a thorough discussion of the procedural risks, benefits and alternatives. All questions were addressed. Maximal Sterile Barrier Technique was utilized including caps, mask, sterile gowns, sterile gloves, sterile drape, hand hygiene and skin antiseptic. A timeout was performed prior to the initiation of the procedure. Operators: Two physicians were present for the complexity of the case. Dr. Arne Cleveland Dr. Corrie Mckusick A micropuncture kit was utilized to access the right common femoral vein under direct, real-time ultrasound guidance after the overlying soft tissues were anesthetized with 1% lidocaine with epinephrine. Ultrasound image documentation was performed. The microwire was passed into the vein under fluoroscopy. Micro puncture was placed, and then a Bentson wire was passed. Standard 5 French sheath was then placed. The wire would not pass easily centrally. Venogram was performed. This demonstrated significant iliac thrombus into the lower IVC. We then elected to proceed with right internal jugular vein access. A micropuncture kit was utilized to access the right internal jugular vein under direct, real-time ultrasound guidance after the overlying soft tissues were anesthetized with 1% lidocaine with epinephrine. Ultrasound image documentation was performed. A Bentson wire was advanced, through the right atrium into the IVC. Serial dilation was performed. At this point, a retrievable filter was placed from  the IJ approach, which is dictated under separate heading. After the IVC filter was placed, Amplatz wire was noted she aided into the IVC. Serial dilation of 18 [place], 20 [place], and 24 [place] was performed at the IJ access site. The proprietary Gore dry seal sheath, 24 French, 33 cm length was placed on the Amplatz wire. Once the sheath was in position, and angled 6 French pigtail catheter was navigated into the pulmonary artery outflow. This was poor formed with a combination of the Bentson wire, back end of the Bentson wire, and the Amplatz wire. Central venous pressure was performed. Once the central venous pressure was performed, the pigtail catheter was removed on a rose in wire with the Rose an wire directed into the lower pulmonary artery branches. The pigtail catheter was removed and then an angled pigtail catheter was used to navigate the wire further distally, for a distal position before placement of the Amplatz wire. Once the catheter was in the distal lower branch, Amplatz wire was placed. Upon initial placement of the catheter set into the lower pole branches, the dry seal sheath had withdrawn from the IJ puncture site which required manual pressure wall we exchanged the catheter for the dry seal introducer. Dry seal sheath was then replaced after this sequence Jewel exchange, with the wire remaining in the lower pole branches. Once the dry seal sheath was secured with a hemostat and the Amplatz wire was safely within lower pole branches, the 24 [place] INARI device was advanced into the right pulmonary artery branches. The introducer was removed, catheter was advanced to the location of thrombus towards the lower pole branches, and the aspiration syringe was attached. The initial aspiration attempt yielded static flow within the syringe, and we observed a 30 second time frame before withdrawing the syringe. Small yield of  thrombus on the initial attempt was confirmed. Three more aspiration attempts  were performed at this time with little further yield of thrombus. The Flowtriever2 was then deployed for additional mechanical thrombectomy advantage, with little yield observed. A final position was then achieved into the upper lobe branches, with advancing a penumbra aspiration catheter and coaxial wire into the upper lobe branches. Aspiration was attempted through penumbra catheter with little yield of thrombus. After 4-5 aspiration attempts, we withdrew from this portion of the case. Aspirin catheters and wires were removed. The IJ dry seal sheath remained until the conclusion of the case after mesenteric angiogram and embolization. This portion of the case is dictated under different heading. After the sheath was removed, stay suture was placed. The right femoral vein sheath was also removed with dry dressing placed. Patient tolerated the procedure well and remained hemodynamically stable throughout. Estimated blood loss: 250 cc FINDINGS: Ultrasound demonstrates patency of right internal jugular vein. Ultrasound at the right inguinal region demonstrates nonocclusive thrombus of the femoral vein. Initial pulmonary angiogram demonstrates ill-defined filling defects within upper lobe and lower lobe branches on the right. The measured pressure at the beginning of the case is recorded in the nursing notes/flow sheet Small yield of aspirated thrombus with the [facility] flowtriever device. The clot appears to be adherent to the wall indicating some subacute and chronic characteristics Venogram of right lower extremity demonstrates significant iliac thrombus into the lower IVC. IMPRESSION: Status post ultrasound guided access right common femoral vein, and right jugular vein for mechanical and aspiration thrombectomy of pulmonary embolus, with small volume thrombus yield, performed for presentation of massive PE in a patient who is not candidate for anticoagulation. Signed, Dulcy Fanny. Dellia Nims, RPVI Vascular and  Interventional Radiology Specialists [facility] Radiology Electronically Signed   By: Corrie Mckusick D.O.   On: 03/08/2020 16:45   IR Angiogram Selective Each Additional Vessel  Result Date: 03/08/2020 INDICATION: 64 year old male with cardiac arrest, massive PE diagnosis, GI hemorrhage with large gastric ulcer, anti-coagulation is contraindicated. He presents for mesenteric angiogram and possible embolization. Same session PE aspiration thrombectomy. EXAM: US GUIDED ACCESS RIGHT COMMON FEMORAL ARTERY MESENTERIC ANGIOGRAM, CELIAC ARTERY, RIGHT GASTRIC ARTERY, GASTRODUODENAL ARTERY COIL EMBOLIZATION OF RIGHT GASTRIC ARTERY AND GDA EXOSEAL DEPLOYED FOR HEMOSTASIS MEDICATIONS: None ANESTHESIA/SEDATION: Moderate (conscious) sedation was employed during this procedure. A total of Versed 0 mg and Fentanyl 100 mcg was administered intravenously. Moderate Sedation Time: 2 hours 24 minutes minutes. The patient's level of consciousness and vital signs were monitored continuously by radiology nursing throughout the procedure under my direct supervision. CONTRAST:  330 cc IV contrast. This total reflects the total amount of contrast for 3 separate cases under 1 anesthesia time. FLUOROSCOPY TIME:  Fluoroscopy Time: 31 minutes 12 seconds (1,144 mGy). This fluoroscopy time reflects 3 separate cases performed. COMPLICATIONS: None PROCEDURE: Informed consent was obtained from the patient following explanation of the procedure, risks, benefits and alternatives. The patient understands, agrees and consents for the procedure. All questions were addressed. A time out was performed prior to the initiation of the procedure. Maximal barrier sterile technique utilized including caps, mask, sterile gowns, sterile gloves, large sterile drape, hand hygiene, and Betadine prep. Once the IVC filter was placed and mechanical thrombectomy of the pulmonary arteries was performed, we proceeded with mesenteric angiogram and possible embolization.  Ultrasound survey of the right inguinal region was performed with images stored and sent to PACs, confirming patency of the vessel. A micropuncture needle was used access  the right common femoral artery under ultrasound. With excellent arterial blood flow returned, and an .018 micro wire was passed through the needle, observed enter the abdominal aorta under fluoroscopy. The needle was removed, and a micropuncture sheath was placed over the wire. The inner dilator and wire were removed, and an 035 Bentson wire was advanced under fluoroscopy into the abdominal aorta. The sheath was removed and a standard 5 [place] vascular sheath was placed. The dilator was removed and the sheath was flushed. A standard C2 Cobra catheter was passed on the Bentson wire used to select the origin of the celiac artery. Celiac artery origin anatomy did not except a good purchase of these Cobra catheter, and thus the Cobra catheter was exchanged for a Mickelson catheter. Mickelson catheter achieved adequate purchase of the celiac artery origin. Angiogram was performed. 150 cm STC catheter was advanced with a 14 fathom wire, used to select the common hepatic artery. Fathom wire was advanced into the right gastric artery with the [place]iter[place] catheter following easily. Angiogram was performed. Coil embolization of the right gastric artery was performed with 2 by 2 mm coils The catheter was then withdrawn and wire was used to select the gastroduodenal artery. Catheter was advanced into the gastroduodenal artery. Angiogram was performed. Coil embolization was then performed of the GDA with combination of 5 mm coils. Final angiogram performed. We then withdrew all catheters wires and deployed Exoseal for hemostasis. Patient remained hemodynamically stable throughout. No complications were encountered and no significant blood loss from this portion of the examination. IMPRESSION: Status post ultrasound guided access right common femoral artery for  mesenteric angiogram and empiric coil embolization of right gastric artery and gastroduodenal artery, as the preferential arterial supply to the anatomic location of large gastric ulcer. Signed, Dulcy Fanny. Dellia Nims, RPVI Vascular and Interventional Radiology Specialists [facility] Radiology Electronically Signed   By: Corrie Mckusick D.O.   On: 03/08/2020 09:33   IR Angiogram Selective Each Additional Vessel  Result Date: 03/08/2020 INDICATION: 64 year old male with cardiac arrest, massive PE diagnosis, GI hemorrhage with large gastric ulcer, anti-coagulation is contraindicated. He presents for mesenteric angiogram and possible embolization. Same session PE aspiration thrombectomy. EXAM: US GUIDED ACCESS RIGHT COMMON FEMORAL ARTERY MESENTERIC ANGIOGRAM, CELIAC ARTERY, RIGHT GASTRIC ARTERY, GASTRODUODENAL ARTERY COIL EMBOLIZATION OF RIGHT GASTRIC ARTERY AND GDA EXOSEAL DEPLOYED FOR HEMOSTASIS MEDICATIONS: None ANESTHESIA/SEDATION: Moderate (conscious) sedation was employed during this procedure. A total of Versed 0 mg and Fentanyl 100 mcg was administered intravenously. Moderate Sedation Time: 2 hours 24 minutes minutes. The patient's level of consciousness and vital signs were monitored continuously by radiology nursing throughout the procedure under my direct supervision. CONTRAST:  330 cc IV contrast. This total reflects the total amount of contrast for 3 separate cases under 1 anesthesia time. FLUOROSCOPY TIME:  Fluoroscopy Time: 31 minutes 12 seconds (1,144 mGy). This fluoroscopy time reflects 3 separate cases performed. COMPLICATIONS: None PROCEDURE: Informed consent was obtained from the patient following explanation of the procedure, risks, benefits and alternatives. The patient understands, agrees and consents for the procedure. All questions were addressed. A time out was performed prior to the initiation of the procedure. Maximal barrier sterile technique utilized including caps, mask, sterile gowns,  sterile gloves, large sterile drape, hand hygiene, and Betadine prep. Once the IVC filter was placed and mechanical thrombectomy of the pulmonary arteries was performed, we proceeded with mesenteric angiogram and possible embolization. Ultrasound survey of the right inguinal region was performed with images stored and  sent to PACs, confirming patency of the vessel. A micropuncture needle was used access the right common femoral artery under ultrasound. With excellent arterial blood flow returned, and an .018 micro wire was passed through the needle, observed enter the abdominal aorta under fluoroscopy. The needle was removed, and a micropuncture sheath was placed over the wire. The inner dilator and wire were removed, and an 035 Bentson wire was advanced under fluoroscopy into the abdominal aorta. The sheath was removed and a standard 5 [place] vascular sheath was placed. The dilator was removed and the sheath was flushed. A standard C2 Cobra catheter was passed on the Bentson wire used to select the origin of the celiac artery. Celiac artery origin anatomy did not except a good purchase of these Cobra catheter, and thus the Cobra catheter was exchanged for a Mickelson catheter. Mickelson catheter achieved adequate purchase of the celiac artery origin. Angiogram was performed. 150 cm STC catheter was advanced with a 14 fathom wire, used to select the common hepatic artery. Fathom wire was advanced into the right gastric artery with the Jaconita catheter following easily. Angiogram was performed. Coil embolization of the right gastric artery was performed with 2 by 2 mm coils The catheter was then withdrawn and wire was used to select the gastroduodenal artery. Catheter was advanced into the gastroduodenal artery. Angiogram was performed. Coil embolization was then performed of the GDA with combination of 5 mm coils. Final angiogram performed. We then withdrew all catheters wires and deployed Exoseal for hemostasis.  Patient remained hemodynamically stable throughout. No complications were encountered and no significant blood loss from this portion of the examination. IMPRESSION: Status post ultrasound guided access right common femoral artery for mesenteric angiogram and empiric coil embolization of right gastric artery and gastroduodenal artery, as the preferential arterial supply to the anatomic location of large gastric ulcer. Signed, Dulcy Fanny. Dellia Nims, RPVI Vascular and Interventional Radiology Specialists [facility] Radiology Electronically Signed   By: Corrie Mckusick D.O.   On: 03/08/2020 09:33   IR Angiogram Selective Each Additional Vessel  Result Date: 03/08/2020 INDICATION: 64 year old male with cardiac arrest, massive PE diagnosis, GI hemorrhage with large gastric ulcer, anti-coagulation is contraindicated. He presents for mesenteric angiogram and possible embolization. Same session PE aspiration thrombectomy. EXAM: US GUIDED ACCESS RIGHT COMMON FEMORAL ARTERY MESENTERIC ANGIOGRAM, CELIAC ARTERY, RIGHT GASTRIC ARTERY, GASTRODUODENAL ARTERY COIL EMBOLIZATION OF RIGHT GASTRIC ARTERY AND GDA EXOSEAL DEPLOYED FOR HEMOSTASIS MEDICATIONS: None ANESTHESIA/SEDATION: Moderate (conscious) sedation was employed during this procedure. A total of Versed 0 mg and Fentanyl 100 mcg was administered intravenously. Moderate Sedation Time: 2 hours 24 minutes minutes. The patient's level of consciousness and vital signs were monitored continuously by radiology nursing throughout the procedure under my direct supervision. CONTRAST:  330 cc IV contrast. This total reflects the total amount of contrast for 3 separate cases under 1 anesthesia time. FLUOROSCOPY TIME:  Fluoroscopy Time: 31 minutes 12 seconds (1,144 mGy). This fluoroscopy time reflects 3 separate cases performed. COMPLICATIONS: None PROCEDURE: Informed consent was obtained from the patient following explanation of the procedure, risks, benefits and alternatives. The  patient understands, agrees and consents for the procedure. All questions were addressed. A time out was performed prior to the initiation of the procedure. Maximal barrier sterile technique utilized including caps, mask, sterile gowns, sterile gloves, large sterile drape, hand hygiene, and Betadine prep. Once the IVC filter was placed and mechanical thrombectomy of the pulmonary arteries was performed, we proceeded with mesenteric angiogram and possible  embolization. Ultrasound survey of the right inguinal region was performed with images stored and sent to PACs, confirming patency of the vessel. A micropuncture needle was used access the right common femoral artery under ultrasound. With excellent arterial blood flow returned, and an .018 micro wire was passed through the needle, observed enter the abdominal aorta under fluoroscopy. The needle was removed, and a micropuncture sheath was placed over the wire. The inner dilator and wire were removed, and an 035 Bentson wire was advanced under fluoroscopy into the abdominal aorta. The sheath was removed and a standard 5 [place] vascular sheath was placed. The dilator was removed and the sheath was flushed. A standard C2 Cobra catheter was passed on the Bentson wire used to select the origin of the celiac artery. Celiac artery origin anatomy did not except a good purchase of these Cobra catheter, and thus the Cobra catheter was exchanged for a Mickelson catheter. Mickelson catheter achieved adequate purchase of the celiac artery origin. Angiogram was performed. 150 cm STC catheter was advanced with a 14 fathom wire, used to select the common hepatic artery. Fathom wire was advanced into the right gastric artery with the Bulger catheter following easily. Angiogram was performed. Coil embolization of the right gastric artery was performed with 2 by 2 mm coils The catheter was then withdrawn and wire was used to select the gastroduodenal artery. Catheter was advanced into  the gastroduodenal artery. Angiogram was performed. Coil embolization was then performed of the GDA with combination of 5 mm coils. Final angiogram performed. We then withdrew all catheters wires and deployed Exoseal for hemostasis. Patient remained hemodynamically stable throughout. No complications were encountered and no significant blood loss from this portion of the examination. IMPRESSION: Status post ultrasound guided access right common femoral artery for mesenteric angiogram and empiric coil embolization of right gastric artery and gastroduodenal artery, as the preferential arterial supply to the anatomic location of large gastric ulcer. Signed, Dulcy Fanny. Dellia Nims, RPVI Vascular and Interventional Radiology Specialists [facility] Radiology Electronically Signed   By: Corrie Mckusick D.O.   On: 03/08/2020 09:33   IR IVC FILTER PLMT / S&I Burke Keels GUID/MOD SED  Result Date: 03/08/2020 INDICATION: 64 year old male with cardiac arrest, massive PE diagnosis, GI hemorrhage with large gastric ulcer, anti-coagulation is contraindicated. He presents for IVC filter placement, same session PE mechanical/aspiration thrombectomy, and mesenteric angiogram with embolization. EXAM: US GUIDED RIGHT COMMON FEMORAL VEIN ACCESS VENOGRAM IVC FILTER PLACEMENT MEDICATIONS: None. ANESTHESIA/SEDATION: 0 mg IV Versed; 100 mcg IV Fentanyl Moderate Sedation Time:  0 minutes The patient was continuously monitored during the procedure by the interventional radiology nurse under my direct supervision. In addition the patient was intubated with respiratory teen involved with his care. FLUOROSCOPY TIME:  Fluoroscopy Time: 31 minutes 12 seconds (1,144 mGy). This fluoroscopy time reflects 3 separate cases performed, with separate dictation COMPLICATIONS: None PROCEDURE: The procedure, risks, benefits, and alternatives were explained to the patient. Specific risks discussed include bleeding, infection, contrast reaction, renal failure, IVC  filter fracture, migration, iliocaval thrombus (3-4% incidence), need for further procedure, need for further surgery, pulmonary embolism, cardiopulmonary collapse, death. Questions regarding the procedure were encouraged and answered. The patient understands and consents to the procedure. Ultrasound survey was performed with images stored and sent to PACs. The right inguinal region was prepped with chlorhexidine in a sterile fashion, and a sterile drape was applied covering the operative field. A sterile gown and sterile gloves were used for the procedure. Local anesthesia was  provided with 1% Lidocaine. We elected to start with access of the right common femoral vein, given our forthcoming attempt at pulmonary embolism thrombectomy with the [facility] device. A micropuncture needle was used access the right common femoral vein under ultrasound. With excellent venous blood flow returned, and an .018 micro wire was passed through the needle. Small incision was made with an 11 blade scalpel. The needle was removed, and a micropuncture sheath was placed over the wire. The inner dilator and wire were removed, and an 035 Bentson wire was advanced under fluoroscopy into the IVC. Standard 5 French sheath was placed. Venogram was performed. The right jugular region was prepped and draped in the usual sterile fashion. 1% lidocaine was used for local anesthesia. A micropuncture needle was used access the right internal jugular vein under ultrasound. With excellent venous blood flow returned, and an .018 micro wire was passed through the needle. Small incision was made with an 11 blade scalpel. The needle was removed, and a micropuncture sheath was placed over the wire. The inner dilator and wire were removed, and an 035 Bentson wire was advanced under fluoroscopy into the IVC. Serial dilation of the soft tissue tract was performed with an 8 [place] dilator and subsequently a 10 [place] dilator. The delivery sheath for a retrievable  Bard Denali filter was passed over the Bentson wire into the IVC. The wire was removed and small contrast was used to confirm IVC location. IVC cavagram performed. Dilator was removed, and the IVC filter was then delivered, positioned below the lowest renal vein. Repeat cavagram performed, and the catheter was removed. After placement of the filter, we proceeded with serial dilation of the IJ access site, for placement of 63F proprietary Gore dry-seal sheath. Patient tolerated the procedure well and remained hemodynamically stable throughout. No complications were encountered and no significant blood loss was encounter. IMPRESSION: Status post ultrasound guided access right common femoral vein with venogram confirming ilio caval thrombus. Status post ultrasound guided access right internal jugular vein with placement of retrievable IVC filter. We then proceeded with pulmonary angiogram and mechanical/aspiration thrombectomy. This will be dictated under a separate dictation Signed, Dulcy Fanny. 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 PLAN: This IVC filter is potentially retrievable. The patient will be assessed for filter retrieval by Interventional Radiology in approximately 8-12 weeks. Further recommendations regarding filter retrieval, continued surveillance or declaration of device permanence, will be made at that time. Electronically Signed   By: Corrie Mckusick D.O.   On: 03/08/2020 09:27   IR THROMBECT PRIM MECH INIT (INCLU) MOD SED  Result Date: 03/08/2020 INDICATION: 64 year old male with cardiac arrest, massive PE diagnosis, GI hemorrhage with large gastric ulcer, anti-coagulation is contraindicated. He presents for attempt at mechanical/aspiration thrombectomy. We then have plans to proceed with mesenteric angiogram and embolization, and  filter placement EXAM: US GUIDED ACCESS OF RIGHT IJ VEIN PULMONARY ANGIOGRAM ASPIRATION/MECHANICAL THROMBECTOMY OF PULMONARY EMBOLUS COMPARISON:  CT 03/07/2020 MEDICATIONS: None. ANESTHESIA/SEDATION: Versed 0 mg IV; Fentanyl 100 mcg IV Moderate Sedation Time:  2 hours 24 minutes The patient was continuously monitored during the procedure by the interventional radiology nurse under my direct supervision. FLUOROSCOPY TIME:  Fluoroscopy Time: 31 minutes 12 seconds (1,144 mGy). This fluoroscopy time reflects 3 separate cases performed at the same event COMPLICATIONS: None TECHNIQUE: Informed written consent was obtained from the patient's  family after a thorough discussion of the procedural risks, benefits and alternatives. All questions were addressed. Maximal Sterile Barrier Technique was utilized including caps, mask, sterile gowns, sterile gloves, sterile drape, hand hygiene and skin antiseptic. A timeout was performed prior to the initiation of the procedure. Operators: Two physicians were present for the complexity of the case. Dr. Arne Cleveland Dr. Corrie Mckusick A micropuncture kit was utilized to access the right common femoral vein under direct, real-time ultrasound guidance after the overlying soft tissues were anesthetized with 1% lidocaine with epinephrine. Ultrasound image documentation was performed. The microwire was passed into the vein under fluoroscopy. Micro puncture was placed, and then a Bentson wire was passed. Standard 5 French sheath was then placed. The wire would not pass easily centrally. Venogram was performed. This demonstrated significant iliac thrombus into the lower IVC. We then elected to proceed with right internal jugular vein access. A micropuncture kit was utilized to access the right internal jugular vein under direct, real-time ultrasound guidance after the overlying soft tissues were anesthetized with 1% lidocaine with epinephrine. Ultrasound image documentation was performed. A  Bentson wire was advanced, through the right atrium into the IVC. Serial dilation was performed. At this point, a retrievable filter was placed from the IJ approach, which is dictated under separate heading. After the IVC filter was placed, Amplatz wire was noted she aided into the IVC. Serial dilation of 18 [place], 20 [place], and 24 [place] was performed at the IJ access site. The proprietary Gore dry seal sheath, 24 French, 33 cm length was placed on the Amplatz wire. Once the sheath was in position, and angled 6 French pigtail catheter was navigated into the pulmonary artery outflow. This was poor formed with a combination of the Bentson wire, back end of the Bentson wire, and the Amplatz wire. Central venous pressure was performed. Once the central venous pressure was performed, the pigtail catheter was removed on a rose in wire with the Rose an wire directed into the lower pulmonary artery branches. The pigtail catheter was removed and then an angled pigtail catheter was used to navigate the wire further distally, for a distal position before placement of the Amplatz wire. Once the catheter was in the distal lower branch, Amplatz wire was placed. Upon initial placement of the catheter set into the lower pole branches, the dry seal sheath had withdrawn from the IJ puncture site which required manual pressure wall we exchanged the catheter for the dry seal introducer. Dry seal sheath was then replaced after this sequence Jewel exchange, with the wire remaining in the lower pole branches. Once the dry seal sheath was secured with a hemostat and the Amplatz wire was safely within lower pole branches, the 24 [place] INARI device was advanced into the right pulmonary artery branches. The introducer was removed, catheter was advanced to the location of thrombus towards the lower pole branches, and the aspiration syringe was attached. The initial aspiration attempt yielded static flow within the syringe, and we observed  a 30 second time frame before withdrawing the syringe. Small yield of thrombus on the initial attempt was confirmed. Three more aspiration attempts were performed at this time with little further yield of thrombus. The Flowtriever2 was then deployed for additional mechanical thrombectomy advantage, with little yield observed. A final position was then achieved into the upper lobe branches, with advancing a penumbra aspiration catheter and coaxial wire into the upper lobe branches. Aspiration was attempted through penumbra catheter with little yield of thrombus.  After 4-5 aspiration attempts, we withdrew from this portion of the case. Aspirin catheters and wires were removed. The IJ dry seal sheath remained until the conclusion of the case after mesenteric angiogram and embolization. This portion of the case is dictated under different heading. After the sheath was removed, stay suture was placed. The right femoral vein sheath was also removed with dry dressing placed. Patient tolerated the procedure well and remained hemodynamically stable throughout. Estimated blood loss: 250 cc FINDINGS: Ultrasound demonstrates patency of right internal jugular vein. Ultrasound at the right inguinal region demonstrates nonocclusive thrombus of the femoral vein. Initial pulmonary angiogram demonstrates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  2:30 PM Patient Name: Kenneth Sullivan MRN: [id number] Epilepsy Attending: Lora Havens Referring Physician/Provider: Dr. Marshell Garfinkel Duration: 03/07/2020 1321 to 03/08/2020 1307  Patient history: 64 year old male presented after cardiac arrest.  EEG to evaluate for seizures.  Level of alertness: Comatose  AEDs during EEG study: Propofol  Technical aspects: This EEG study was done with scalp electrodes positioned according to the 10-20 International system of electrode placement. Electrical activity was acquired at a sampling rate of _4  and reviewed with a high frequency filter of _5  and a low frequency filter of _6 . EEG data were recorded continuously and digitally stored.  Description: EEG initially showed continuous generalized low amplitude 3-_7  theta-delta slowing with overriding 15 to 18 Hz, 2-3 uV beta activity distributed symmetrically and diffusely. Gradually eeg showed predominantly 4-_8  generalized theta-alpha activity.  Hyperventilation and photic stimulation were not performed.  Abnormality -Continuous slow, generalized -Excessive beta, generalized  IMPRESSION: This study is suggestive of moderate to severe diffuse encephalopathy, nonspecific etiology but could be secondary to sedation.  No seizures or epileptiform discharges were seen throughout the recording. EEG appeared to be improving throughout the study. Lora Havens   ECHOCARDIOGRAM COMPLETE  Result Date: 03/07/2020    ECHOCARDIOGRAM REPORT   Patient Name:   JSHAUN ABERNATHY Date of Exam: 03/07/2020 Medical Rec #:  [id number]    Height:       74.0 in Accession #:    [id number]   Weight:       160.0 lb Date of Birth:  November 25, 1956    BSA:          1.977 m Patient Age:    29 years     BP:           109/89 mmHg Patient Gender: M            HR:           116 bpm. Exam Location:   Inpatient Procedure: 2D Echo, Cardiac Doppler and Color Doppler Indications:    Cardiac arrest I46.9  History:        Patient has no prior history of Echocardiogram examinations.  Risk Factors:Current Smoker.  Sonographer:    Vickie Epley RDCS Referring Phys: [id number] [facility]  Sonographer Comments: Echo performed with patient supine and on artificial respirator and Technically challenging study due to limited acoustic windows. Image acquisition challenging due to respiratory motion. IMPRESSIONS  1. Very difficult study with poor windows. The RV appears severely dilated with severely reduced function. D shaped septum. Overall, concerns for pulmonary embolism in this patient with cardiac arrest. Clinical correlation is recommended.  2. Left ventricular ejection fraction, by estimation, is 60 to 65%. The left ventricle has normal function. Left ventricular endocardial border not optimally defined to evaluate regional wall motion. Left ventricular diastolic function could not be evaluated. There is the interventricular septum is flattened in systole and diastole, consistent with right ventricular pressure and volume overload.  3. Right ventricular systolic function is severely reduced. The right ventricular size is severely enlarged.  4. The mitral valve is grossly normal. No evidence of mitral valve regurgitation. No evidence of mitral stenosis.  5. The aortic valve is grossly normal. Aortic valve regurgitation is not visualized. No aortic stenosis is present.  6. Right atrial size was mildly dilated. FINDINGS  Left Ventricle: Left ventricular ejection fraction, by estimation, is 60 to 65%. The left ventricle has normal function. Left ventricular endocardial border not optimally defined to evaluate regional wall motion. The left ventricular internal cavity size was small. There is no left ventricular hypertrophy. The interventricular septum is flattened in systole and diastole, consistent with right  ventricular pressure and volume overload. Left ventricular diastolic function could not be evaluated due to nondiagnostic images. Left ventricular diastolic function could not be evaluated. Right Ventricle: The right ventricular size is severely enlarged. No increase in right ventricular wall thickness. Right ventricular systolic function is severely reduced. Left Atrium: Left atrial size was normal in size. Right Atrium: Right atrial size was mildly dilated. Pericardium: There is no evidence of pericardial effusion. Mitral Valve: The mitral valve is grossly normal. No evidence of mitral valve regurgitation. No evidence of mitral valve stenosis. Tricuspid Valve: The tricuspid valve is grossly normal. Tricuspid valve regurgitation is mild . No evidence of tricuspid stenosis. Aortic Valve: The aortic valve is grossly normal. Aortic valve regurgitation is not visualized. No aortic stenosis is present. Pulmonic Valve: The pulmonic valve was grossly normal. Pulmonic valve regurgitation is not visualized. No evidence of pulmonic stenosis. Aorta: The aortic root is normal in size and structure. Venous: IVC assessment for right atrial pressure unable to be performed due to mechanical ventilation. IAS/Shunts: No atrial level shunt detected by color flow Doppler. Additional Comments: Very difficult study with poor windows. The RV appears severely dilated with severely reduced function. D shaped septum. Overall, concerns for pulmonary embolism in this patient with cardiac arrest. Clinical correlation is recommended.  LEFT VENTRICLE PLAX 2D LVOT diam:     2.00 cm LVOT Area:     3.14 cm  RIGHT VENTRICLE TAPSE (M-mode): 1.1 cm RIGHT ATRIUM          Index RA Area:     7.97 cm RA Volume:   15.10 ml 7.64 ml/m  MV E velocity: 61.70 cm/s  TRICUSPID VALVE                            TR Peak grad:   25.6 mmHg  TR Vmax:        253.00 cm/s                             SHUNTS                            Systemic  Diam: 2.00 cm Eleonore Chiquito MD Electronically signed by Eleonore Chiquito MD Signature Date/Time: 03/07/2020/9:57:03 AM    Final    IR EMBO ART  VEN HEMORR LYMPH EXTRAV  INC GUIDE ROADMAPPING  Result Date: 03/08/2020 INDICATION: 64 year old male with cardiac arrest, massive PE diagnosis, GI hemorrhage with large gastric ulcer, anti-coagulation is contraindicated. He presents for mesenteric angiogram and possible embolization. Same session PE aspiration thrombectomy. EXAM: US GUIDED ACCESS RIGHT COMMON FEMORAL ARTERY MESENTERIC ANGIOGRAM, CELIAC ARTERY, RIGHT GASTRIC ARTERY, GASTRODUODENAL ARTERY COIL EMBOLIZATION OF RIGHT GASTRIC ARTERY AND GDA EXOSEAL DEPLOYED FOR HEMOSTASIS MEDICATIONS: None ANESTHESIA/SEDATION: Moderate (conscious) sedation was employed during this procedure. A total of Versed 0 mg and Fentanyl 100 mcg was administered intravenously. Moderate Sedation Time: 2 hours 24 minutes minutes. The patient's level of consciousness and vital signs were monitored continuously by radiology nursing throughout the procedure under my direct supervision. CONTRAST:  330 cc IV contrast. This total reflects the total amount of contrast for 3 separate cases under 1 anesthesia time. FLUOROSCOPY TIME:  Fluoroscopy Time: 31 minutes 12 seconds (1,144 mGy). This fluoroscopy time reflects 3 separate cases performed. COMPLICATIONS: None PROCEDURE: Informed consent was obtained from the patient following explanation of the procedure, risks, benefits and alternatives. The patient understands, agrees and consents for the procedure. All questions were addressed. A time out was performed prior to the initiation of the procedure. Maximal barrier sterile technique utilized including caps, mask, sterile gowns, sterile gloves, large sterile drape, hand hygiene, and Betadine prep. Once the IVC filter was placed and mechanical thrombectomy of the pulmonary arteries was performed, we proceeded with mesenteric angiogram and possible  embolization. Ultrasound survey of the right inguinal region was performed with images stored and sent to PACs, confirming patency of the vessel. A micropuncture needle was used access the right common femoral artery under ultrasound. With excellent arterial blood flow returned, and an .018 micro wire was passed through the needle, observed enter the abdominal aorta under fluoroscopy. The needle was removed, and a micropuncture sheath was placed over the wire. The inner dilator and wire were removed, and an 035 Bentson wire was advanced under fluoroscopy into the abdominal aorta. The sheath was removed and a standard 5 [place] vascular sheath was placed. The dilator was removed and the sheath was flushed. A standard C2 Cobra catheter was passed on the Bentson wire used to select the origin of the celiac artery. Celiac artery origin anatomy did not except a good purchase of these Cobra catheter, and thus the Cobra catheter was exchanged for a Mickelson catheter. Mickelson catheter achieved adequate purchase of the celiac artery origin. Angiogram was performed. 150 cm STC catheter was advanced with a 14 fathom wire, used to select the common hepatic artery. Fathom wire was advanced into the right gastric artery with the [place] catheter following easily. Angiogram was performed. Coil embolization of the right gastric artery was performed with 2 by 2 mm coils The catheter was then withdrawn and wire was used to select the gastroduodenal artery. Catheter was advanced into the gastroduodenal artery. Angiogram was performed. Coil embolization was  then performed of the GDA with combination of 5 mm coils. Final angiogram performed. We then withdrew all catheters wires and deployed Exoseal for hemostasis. Patient remained hemodynamically stable throughout. No complications were encountered and no significant blood loss from this portion of the examination. IMPRESSION: Status post ultrasound guided access right common femoral  artery for mesenteric angiogram and empiric coil embolization of right gastric artery and gastroduodenal artery, as the preferential arterial supply to the anatomic location of large gastric ulcer. Signed, Dulcy Fanny. Dellia Nims, RPVI [facility] Radiology Electronically Signed   By: Corrie Mckusick D.O.   On: 03/08/2020 09:33   VAS US CAROTID  Result Date: 03/12/2020 Carotid Arterial Duplex Study Indications:       CVA. Limitations        Today's exam was limited due to patient on a ventilator and                    bandaging on right neck. Comparison Study:  No prior study Performing Technologist: Maudry Mayhew MHA, RDMS, RVT, RDCS  Examination Guidelines: A complete evaluation includes B-mode imaging, spectral Doppler, color Doppler, and power Doppler as needed of all accessible portions of each vessel. Bilateral testing is considered an integral part of a complete examination. Limited examinations for reoccurring indications may be performed as noted.  Right Carotid Findings: +----------+--------+--------+--------+------------------+--------+           PSV cm/sEDV cm/sStenosisPlaque DescriptionComments +----------+--------+--------+--------+------------------+--------+ CCA Distal37      13                                         +----------+--------+--------+--------+------------------+--------+ ICA Prox  97      43                                         +----------+--------+--------+--------+------------------+--------+ ICA Distal64      32                                         +----------+--------+--------+--------+------------------+--------+ ECA       20      5                                          +----------+--------+--------+--------+------------------+--------+ +---------+--------+--+--------+--+---------+ VertebralPSV cm/s36EDV cm/s12Antegrade +---------+--------+--+--------+--+---------+  Left Carotid  Findings: +----------+--------+--------+--------+------------------+--------+           PSV cm/sEDV cm/sStenosisPlaque DescriptionComments +----------+--------+--------+--------+------------------+--------+ CCA Prox  68      26                                         +----------+--------+--------+--------+------------------+--------+ CCA Distal54      23                                         +----------+--------+--------+--------+------------------+--------+ ICA Prox  49      25                                         +----------+--------+--------+--------+------------------+--------+  ICA Distal101     53                                         +----------+--------+--------+--------+------------------+--------+ ECA       38      12                                         +----------+--------+--------+--------+------------------+--------+ +----------+--------+--------+----------------+-------------------+           PSV cm/sEDV cm/sDescribe        Arm Pressure (mmHG) +----------+--------+--------+----------------+-------------------+ Subclavian100             Multiphasic, WNL                    +----------+--------+--------+----------------+-------------------+ +---------+--------+--+--------+--+---------+ VertebralPSV cm/s57EDV cm/s24Antegrade +---------+--------+--+--------+--+---------+   Summary: Right Carotid: Velocities in the right ICA are consistent with an upper range                1-39% stenosis. Left Carotid: Velocities in the left ICA are consistent with a 1-39% stenosis. Vertebrals:  Bilateral vertebral arteries demonstrate antegrade flow. Subclavians: Normal flow hemodynamics were seen in bilateral subclavian              arteries. *See table(s) above for measurements and observations.  Electronically signed by Deitra Mayo MD on 03/12/2020 at 6:35:45 PM.    Final    VAS [place] LOWER EXTREMITY VENOUS (DVT)  Result Date: 03/08/2020   Lower Venous DVTStudy Indications: Swelling.  Limitations: Poor ultrasound/tissue interface, bandages, line and arctic sun device. Comparison Study: IR procedure 03-07-20, positive IVC and right iliac vein. Performing Technologist: Baldwin Crown ARDMS, RVT  Examination Guidelines: A complete evaluation includes B-mode imaging, spectral Doppler, color Doppler, and power Doppler as needed of all accessible portions of each vessel. Bilateral testing is considered an integral part of a complete examination. Limited examinations for reoccurring indications may be performed as noted. The reflux portion of the exam is performed with the patient in reverse Trendelenburg.  +---------+---------------+---------+-----------+----------+--------------+ RIGHT    CompressibilityPhasicitySpontaneityPropertiesThrombus Aging +---------+---------------+---------+-----------+----------+--------------+ CFV      None           No       No                   Acute          +---------+---------------+---------+-----------+----------+--------------+ SFJ      None                                                        +---------+---------------+---------+-----------+----------+--------------+ FV Prox                                               Not visualized +---------+---------------+---------+-----------+----------+--------------+ FV Mid  Not visualized +---------+---------------+---------+-----------+----------+--------------+ FV Distal                                             Not visualized +---------+---------------+---------+-----------+----------+--------------+ PFV                                                   Not visualized +---------+---------------+---------+-----------+----------+--------------+ POP      None           No       No                                   +---------+---------------+---------+-----------+----------+--------------+ PTV      None                                                        +---------+---------------+---------+-----------+----------+--------------+ PERO     None                                                        +---------+---------------+---------+-----------+----------+--------------+   +---------+---------------+---------+-----------+----------+--------------+ LEFT     CompressibilityPhasicitySpontaneityPropertiesThrombus Aging +---------+---------------+---------+-----------+----------+--------------+ CFV      None           No       No                   Acute          +---------+---------------+---------+-----------+----------+--------------+ SFJ                                                   Not visualized +---------+---------------+---------+-----------+----------+--------------+ FV Prox                                               Not visualized +---------+---------------+---------+-----------+----------+--------------+ FV Mid                                                Not visualized +---------+---------------+---------+-----------+----------+--------------+ FV Distal                                             Not visualized +---------+---------------+---------+-----------+----------+--------------+ PFV  Not visualized +---------+---------------+---------+-----------+----------+--------------+ POP      None           No       No                                  +---------+---------------+---------+-----------+----------+--------------+ PTV      None                                                        +---------+---------------+---------+-----------+----------+--------------+ PERO     None                                                         +---------+---------------+---------+-----------+----------+--------------+ Gastroc  None                                                        +---------+---------------+---------+-----------+----------+--------------+ CIV                                                   Not visualized +---------+---------------+---------+-----------+----------+--------------+ EIV                                                   Not visualized +---------+---------------+---------+-----------+----------+--------------+ Unable to visualize bilateral femoral and iliac veins due to arctic sun device. Poorly visualized bilateral calf veins.    Summary: RIGHT: - Findings consistent with acute deep vein thrombosis involving the right common femoral vein, SF junction, right popliteal vein, right posterior tibial veins, and right peroneal veins. However, portions of this examination were limited- see technologist  comments above.  LEFT: - Findings consistent with acute deep vein thrombosis involving the left common femoral vein, left popliteal vein, left posterior tibial veins, and left peroneal veins. However, portions of this examination were limited- see technologist comments above.  *See table(s) above for measurements and observations. Electronically signed by Ruta Hinds MD on 03/08/2020 at 4:02:44 PM.    Final     Labs:  CBC: Recent Labs    03/14/20 0309 03/15/20 0451 03/18/20 0352 03/23/20 0945  WBC 13.5* 15.0* 12.7* 14.3*  HGB 9.2* 9.8* 8.7* 11.0*  HCT 27.3* 29.6* 27.0* 33.5*  PLT 187 235 321 470*    COAGS: Recent Labs    03/07/20 1243 03/08/20 0337  INR 1.3* 1.1  APTT 39*  --     BMP: Recent Labs    03/19/20 0244 03/21/20 1012 03/22/20 0309 03/23/20 0945  NA 142 138 136 136  K 3.2* 2.8* 2.9* 3.1*  CL 103 100 98 100  CO2 _0 GLUCOSE 100* 102* 96 89  BUN 16 9 6* <5*  CALCIUM  8.3* 8.4* 8.1* 8.5*  CREATININE 0.86 0.76 0.77 0.70  GFRNONAA >60 >60 >60 >60   GFRAA >60 >60 >60 >60    LIVER FUNCTION TESTS: Recent Labs    03/08/20 0337 03/10/20 0518 03/10/20 1100 03/11/20 0422  BILITOT 0.3 0.8 0.7 0.8  AST 154* 58* 51* 41  ALT 93* 52* 47* 36  ALKPHOS 69 64 60 56  PROT 4.9* 5.6* 5.4* 5.4*  ALBUMIN 2.0* 2.3* 2.2* 2.0*    TUMOR MARKERS: No results for input(s): AFPTM, CEA, CA199, CHROMGRNA in the last 8760 hours.  Assessment and Plan:  Dysphagia; malnutrition Debilitation Scheduled for percutaneous gastric tube placement Will call Niece Kenneth Sullivan for consent  Thank you for this interesting consult.  I greatly enjoyed meeting Kenneth Sullivan and look forward to participating in their care.  A copy of this report was sent to the requesting provider on this date.  Electronically Signed: Lavonia Drafts, PA-C 03/23/2020, 12:25 PM   I spent a total of 40 Minutes    in face to face in clinical consultation, greater than 50% of which was counseling/coordinating care for percutaneous gastric tube placement

## 2020-03-23 NOTE — Progress Notes (Signed)
Patient has been very agitated and irritable from the beginning of this RNs shift. Patient has been mouthing words to staff stating "Leave me alone, I want to go home." Patient has been kicking the bed while agitated as well.

## 2020-03-24 ENCOUNTER — Inpatient Hospital Stay (HOSPITAL_COMMUNITY): Payer: Medicaid Other

## 2020-03-24 DIAGNOSIS — R1310 Dysphagia, unspecified: Secondary | ICD-10-CM | POA: Diagnosis not present

## 2020-03-24 DIAGNOSIS — J9601 Acute respiratory failure with hypoxia: Secondary | ICD-10-CM | POA: Diagnosis not present

## 2020-03-24 DIAGNOSIS — I63429 Cerebral infarction due to embolism of unspecified anterior cerebral artery: Secondary | ICD-10-CM | POA: Diagnosis not present

## 2020-03-24 LAB — GLUCOSE, CAPILLARY
Glucose-Capillary: 110 mg/dL — ABNORMAL HIGH (ref 70–99)
Glucose-Capillary: 121 mg/dL — ABNORMAL HIGH (ref 70–99)
Glucose-Capillary: 63 mg/dL — ABNORMAL LOW (ref 70–99)
Glucose-Capillary: 67 mg/dL — ABNORMAL LOW (ref 70–99)
Glucose-Capillary: 73 mg/dL (ref 70–99)
Glucose-Capillary: 75 mg/dL (ref 70–99)
Glucose-Capillary: 77 mg/dL (ref 70–99)
Glucose-Capillary: 93 mg/dL (ref 70–99)

## 2020-03-24 LAB — BASIC METABOLIC PANEL
Anion gap: 13 (ref 5–15)
BUN: <5 mg/dL — ABNORMAL LOW (ref 8–23)
CO2: 24 mmol/L (ref 22–32)
Calcium: 8.3 mg/dL — ABNORMAL LOW (ref 8.9–10.3)
Chloride: 98 mmol/L (ref 98–111)
Creatinine, Ser: 0.77 mg/dL (ref 0.61–1.24)
GFR calc Af Amer: >60 mL/min (ref >60)
GFR calc non Af Amer: >60 mL/min (ref >60)
Glucose, Bld: 83 mg/dL (ref 70–99)
Potassium: 3 mmol/L — ABNORMAL LOW (ref 3.5–5.1)
Sodium: 135 mmol/L (ref 135–145)

## 2020-03-24 LAB — CBC
HCT: 28.7 % — ABNORMAL LOW (ref 39.0–52.0)
Hemoglobin: 9.4 g/dL — ABNORMAL LOW (ref 13.0–17.0)
MCH: 30.8 pg (ref 26.0–34.0)
MCHC: 32.8 g/dL (ref 30.0–36.0)
MCV: 94.1 fL (ref 80.0–100.0)
Platelets: 441 10*3/uL — ABNORMAL HIGH (ref 150–400)
RBC: 3.05 MIL/uL — ABNORMAL LOW (ref 4.22–5.81)
RDW: 15.9 % — ABNORMAL HIGH (ref 11.5–15.5)
WBC: 11.8 10*3/uL — ABNORMAL HIGH (ref 4.0–10.5)
nRBC: 0 % (ref 0.0–0.2)

## 2020-03-24 LAB — MAGNESIUM
Magnesium: 1.7 mg/dL (ref 1.7–2.4)
Magnesium: 1.7 mg/dL (ref 1.7–2.4)

## 2020-03-24 LAB — PHOSPHORUS: Phosphorus: 2.6 mg/dL (ref 2.5–4.6)

## 2020-03-24 MED ORDER — PRO-STAT SUGAR FREE PO LIQD
30.0000 mL | Freq: Every day | ORAL | Status: DC
  Administered 2020-03-25 – 2020-04-17 (×16): 30 mL
  Filled 2020-03-24 (×16): qty 30

## 2020-03-24 MED ORDER — METOPROLOL TARTRATE 5 MG/5ML IV SOLN
2.5000 mg | Freq: Once | INTRAVENOUS | Status: AC
  Administered 2020-03-24: 2.5 mg via INTRAVENOUS
  Filled 2020-03-24: qty 5

## 2020-03-24 MED ORDER — OSMOLITE 1.2 CAL PO LIQD: 1000.0000 mL | ORAL | Status: DC

## 2020-03-24 MED ORDER — DEXTROSE 50 % IV SOLN
INTRAVENOUS | Status: AC
  Administered 2020-03-24: 25 mL
  Filled 2020-03-24: qty 50

## 2020-03-24 MED ORDER — HALOPERIDOL LACTATE 5 MG/ML IJ SOLN
2.0000 mg | Freq: Once | INTRAMUSCULAR | Status: AC
  Administered 2020-03-24: 2 mg via INTRAVENOUS

## 2020-03-24 MED ORDER — POTASSIUM CHLORIDE 10 MEQ/100ML IV SOLN
10.0000 meq | INTRAVENOUS | Status: AC
  Administered 2020-03-24 (×3): 10 meq via INTRAVENOUS
  Filled 2020-03-24 (×2): qty 100

## 2020-03-24 MED ORDER — JEVITY 1.2 CAL PO LIQD
1000.0000 mL | ORAL | Status: DC
  Administered 2020-03-25 – 2020-04-10 (×8): 1000 mL
  Filled 2020-03-24 (×34): qty 1000

## 2020-03-24 MED ORDER — LORAZEPAM 2 MG/ML IJ SOLN
1.0000 mg | INTRAMUSCULAR | Status: DC | PRN
  Administered 2020-03-24 – 2020-04-12 (×41): 1 mg via INTRAVENOUS
  Filled 2020-03-24 (×41): qty 1

## 2020-03-24 MED ORDER — HALOPERIDOL LACTATE 5 MG/ML IJ SOLN
2.0000 mg | Freq: Once | INTRAMUSCULAR | Status: AC
  Administered 2020-03-24: 2 mg via INTRAVENOUS
  Filled 2020-03-24: qty 1

## 2020-03-24 MED ORDER — HALOPERIDOL LACTATE 5 MG/ML IJ SOLN
INTRAMUSCULAR | Status: AC
  Filled 2020-03-24: qty 1

## 2020-03-24 MED ORDER — SODIUM CHLORIDE 0.9% FLUSH
10.0000 mL | Freq: Two times a day (BID) | INTRAVENOUS | Status: DC
  Administered 2020-03-24 – 2020-04-15 (×38): 10 mL

## 2020-03-24 MED ORDER — LORAZEPAM 2 MG/ML IJ SOLN
0.5000 mg | Freq: Once | INTRAMUSCULAR | Status: AC
  Administered 2020-03-24: 0.5 mg via INTRAVENOUS
  Filled 2020-03-24: qty 1

## 2020-03-24 MED ORDER — FREE WATER
200.0000 mL | Freq: Four times a day (QID) | Status: DC
  Administered 2020-03-25 – 2020-04-14 (×65): 200 mL

## 2020-03-24 NOTE — Progress Notes (Signed)
PALLIATIVE NOTE:  Patient resting in bed. Easily aroused. Continues to have large amounts of tracheal and mouth secretions. Awake and alert. Able to mouth responses appropriately. Doesn't seem as agitated in his responses today and cooperative in following commands. Denied pain.   Called and spoke with niece, [name]. Patient was able to mouth his awareness of his niece being on the phone. Confirmed plans for PEG placement as requested by [name]. Further education provided regarding long-term care needs and placement. [name] verbalized understanding. She reports plans to visit patient tomorrow. Updated [name] that patient is currently being evaluated by IR for plans of PEG placement and will contact her and further discuss in the upcoming days. [name] verbalized understanding and appreciation.   I again shared with niece patient's continued aggressive behaviors, refusal of care, and signs of agitation. [name] again reports this is patient's normal behavior when he does not like his condition and expresses he is angry about being in the hospital. She reports she plans to speak with him when she comes to visit tomorrow.   I spent time explaining to patient about his care plan and why he requires certain care. He nods head yes in understanding. I asked if he did not want to continue with all aggressive care given he shows signs of agitation, he initially closed his eyes and turned head. After several minutes of silence he nods head in yes motion and mouths "whatever. [name] knows!"   Questions answered and support given. Family has contact information if needed.   Assessment -agitated at times, ill-appearing, thin -RRR -trach secretions -awake, alert, will mouth responses   Plan -continue with current plan of care -Pending Peg placement by IR as requested by niece, [name] -PMT will continue to support and follow   Time Total: 35 min.   Visit consisted of counseling and education dealing with the  complex and emotionally intense issues of symptom management and palliative care in the setting of serious and potentially life-threatening illness.Greater than 50%  of this time was spent counseling and coordinating care related to the above assessment and plan.  Alda Lea, AGPCNP-BC  Palliative Medicine Team [phone]

## 2020-03-24 NOTE — Progress Notes (Addendum)
PROGRESS NOTE    Kenneth Sullivan  NUU:[id number] DOB: Jun 15, 1956 DOA: 03/07/2020 PCP: Medicine, [name]  Brief Narrative:  64 year old gentleman with no significant past medical history presents with shortness of breath and hypoxic had a witnessed bradycardic asystolic arrest underwent normothermia protocol was found to have significant massive PE, DVT with RV strain and acute GI bleed from gastric ulcer.  Patient was admitted on March 07 2020.  When EGD showing oozing gastric ulcer with clot.  He underwent IR guided mechanical thrombectomy, IVC filter placement and GDA embolization.  His hospital course was complicated by multifocal acute to subacute ischemic infarcts involving bilateral cerebral hemispheres consistent with global hypoperfusion related to cardiac arrest associated with scattered petechial hemorrhages with evidence of hemorrhagic conversion in the right parietal lobe.  Patient is seen and examined he is more alert and agitated this morning, tachypneic, tachycardic. RN reports copious thick secretions foul-smelling and slightly yellow-colored.  Chest x-ray ordered for further evaluation. Assessment & Plan:   Principal Problem:   Cardiac arrest [facility]) Active Problems:   Encounter for central line placement   GI bleed   Acute respiratory failure (HCC)   Anoxic encephalopathy (HCC)   Cerebral embolism with cerebral infarction   Pulmonary emboli ([place])   Palliative care by specialist   Goals of care, counseling/discussion   DNR (do not resuscitate)   Cardiac arrest secondary to massive pulmonary embolism S/p  3 rounds of epi, 20 min to ROSC, underwent normothermic protocol.  MRI of the brain shows multiple acute to subacute ischemic infarcts involving bilateral cerebral hemispheres consistent with global hypoperfusion related to cardiac arrest.  S/p IVC filter placement Continue supportive care, patient denies any chest pain at this time.   Anoxic  encephalopathy CVAs with hemorrhagic conversion MRI showing multiple acute and subacute infarcts with evidence of hemorrhagic conversion Poor prognosis for any meaningful recovery. Palliative care consulted and after goals of care discussion family would like to pursue aggressive management including PEG placement and LTAC placement Family ( his niece) would like to pursue  feeding tubes and  LTAC placement. IR consulted for PEG placement.    Acute respiratory failure with hypoxia secondary to massive PE with cor pulmonale S/p intubation on admission and s/p trach placement on 03/20/2020.  S/p mechanical thrombectomy by IR and IVC filter placement for the acute DVT, unable to anti coagulate due to hemorrhagic conversion of the multiple infarcts.  Currently on trach collar and he is able to tolerate but continues to have copious, thick secretions.  Robinul added in addition to scopolamine patch to help with secretions. Chest x-ray does not show any infiltrates. Continue with pulmonary hygiene. scopolamine patch for upper respiratory trach secretions.  Will request PCCM to change the trach to cuffless in am.  Speech evaluation recommending  Passy muir Speech valve onl with SLP with full supervision.      Acute GI bleed secondary to large gastric ulcer S/p GDA embolization. Resume PPI.    Nutrition: malnourished.  IR consulted for PEG placement to be is placed today.  Persistent hypokalemia Replaced.  Repeat in the morning level at 1.7.    Acute anemia of blood loss  Hemoglobin stable around 9  transfuse to keep hemoglobin greater than 7.    Agitation Patient is frustrated about not being able to swallow. As needed Haldol/Ativan ordered.    DVT prophylaxis: SCDs Code Status: DNR Family Communication: None at bedside Disposition Plan:  . Patient came from: Home           .  Anticipated d/c place: Pending . Barriers to d/c OR conditions which need to be met to effect a  safe d/c: Pending palliative care discussion with the family   Consultants:   Gastroenterology  Cardiology  IR  Neurology  Procedures:  CTA 3/23 >> bilateral PE with RV/with ratio 1.9, emphysema CT head 3/23 >> chronic microvascular changes.  No acute findings Echo 3/23 >> RV is severely dilated with reduced function and D-shaped septum, LVEF 60-65%. LE Venous Duplex 3/24 >> acute DVT in BLE up to the femoral vein  MRI Brain 3/28 >> multifocal acute to subacute ischemic infarcts involving bilateral cerebral hemispheres, findings consistent with global hypoperfusion event related to cardiac arrest, associated scattered petechial hemorrhage about many areas of ischemia, evidence of hemorrhagic conversion in the right parietal lobe. ETT 3/23 >>4/5 Trach 4/5>> L TLC 3/23 >>  ALine L Fem 3/23 >> 3/30    Antimicrobials: None   Subjective: Patient agitated,  not in any kind of distress.  Objective: Vitals:   03/24/20 0100 03/24/20 0243 03/24/20 0447 03/24/20 0732  BP:   (!) 152/96 (!) 146/87  Pulse: (!) 110 98 (!) 105 (!) 113  Resp: (!) 24 (!) 24 (!) 27 (!) 30  Temp:   98.7 F (37.1 C)   TempSrc:   Oral   SpO2: 94% 92%  100%  Weight:      Height:        Intake/Output Summary (Last 24 hours) at 03/24/2020 1340 Last data filed at 03/24/2020 0043 Gross per 24 hour  Intake 1598.46 ml  Output 210 ml  Net 1388.46 ml   Filed Weights   03/19/20 0500 03/20/20 0500 03/23/20 0342  Weight: 76 kg 76.1 kg 77.8 kg    Examination:  General exam: Ill-appearing gentleman cachectic looking s/p trach not in any kind of distress. Respiratory system: Air entry fair bilateral no wheezing or rhonchi tachypnea present Cardiovascular system: S1-S2 heard, tachycardic, no JVD, no pedal edema Gastrointestinal system: Abdomen is soft, nontender bowel sounds normal Central nervous system: Alert and trying to communicate mouthing words will benefit from Passy-Muir valve Extremities: No pedal  edema or cyanosis Skin: Excoriations on the back Psychiatry: Cannot be assessed    Data Reviewed: I have personally reviewed following labs and imaging studies  CBC: Recent Labs  Lab 03/18/20 0352 03/23/20 0945 03/24/20 1000  WBC 12.7* 14.3* 11.8*  NEUTROABS 10.6*  --   --   HGB 8.7* 11.0* 9.4*  HCT 27.0* 33.5* 28.7*  MCV 98.9 96.3 94.1  PLT 321 470* 579*   Basic Metabolic Panel: Recent Labs  Lab 03/18/20 0352 03/18/20 0352 03/19/20 0244 03/21/20 1012 03/22/20 0309 03/23/20 0945 03/24/20 1000  NA 142   < > 142 138 136 136 135  K 2.9*   < > 3.2* 2.8* 2.9* 3.1* 3.0*  CL 102   < > 103 100 98 100 98  CO2 29   < > 28 26 26 24 24   GLUCOSE 107*   < > 100* 102* 96 89 83  BUN 24*   < > 16 9 6* <5* <5*  CREATININE 0.82   < > 0.86 0.76 0.77 0.70 0.77  CALCIUM 8.5*   < > 8.3* 8.4* 8.1* 8.5* 8.3*  MG 2.1  --   --   --  1.7  --  1.7  PHOS 3.2  --   --   --  2.7  --   --    < > = values in this interval  not displayed.   GFR: Estimated Creatinine Clearance: 105.4 mL/min (by C-G formula based on SCr of 0.77 mg/dL). Liver Function Tests: No results for input(s): AST, ALT, ALKPHOS, BILITOT, PROT, ALBUMIN in the last 168 hours. No results for input(s): LIPASE, AMYLASE in the last 168 hours. No results for input(s): AMMONIA in the last 168 hours. Coagulation Profile: No results for input(s): INR, PROTIME in the last 168 hours. Cardiac Enzymes: No results for input(s): CKTOTAL, CKMB, CKMBINDEX, TROPONINI in the last 168 hours. BNP (last 3 results) No results for input(s): PROBNP in the last 8760 hours. HbA1C: No results for input(s): HGBA1C in the last 72 hours. CBG: Recent Labs  Lab 03/23/20 2305 03/24/20 0443 03/24/20 0709 03/24/20 1211 03/24/20 1246  GLUCAP 90 73 77 63* 121*   Lipid Profile: No results for input(s): CHOL, HDL, LDLCALC, TRIG, CHOLHDL, LDLDIRECT in the last 72 hours. Thyroid Function Tests: No results for input(s): TSH, T4TOTAL, FREET4, T3FREE,  THYROIDAB in the last 72 hours. Anemia Panel: No results for input(s): VITAMINB12, FOLATE, FERRITIN, TIBC, IRON, RETICCTPCT in the last 72 hours. Sepsis Labs: No results for input(s): PROCALCITON, LATICACIDVEN in the last 168 hours.  No results found for this or any previous visit (from the past 240 hour(s)).       Radiology Studies: CT ABDOMEN WO CONTRAST  Result Date: 03/23/2020 CLINICAL DATA:  Evaluate anatomy prior to potential percutaneous gastrostomy tube placement. History of GI bleeding requiring mesenteric arteriogram and embolization. EXAM: CT ABDOMEN WITHOUT CONTRAST TECHNIQUE: Multidetector CT imaging of the abdomen was performed following the standard protocol without IV contrast. COMPARISON:  Chest CT-03/07/2020 FINDINGS: Lower chest: Limited visualization of the lower thorax demonstrates development of a trace right-sided pleural effusion with associated right basilar consolidative opacities. Minimal subpleural opacities are seen within the imaged left lower lobe. Normal heart size. Diffuse decreased attenuation of the intra cardiac blood pool suggestive of anemia. Hepatobiliary: Normal hepatic contour. Suspected focal fatty sparing adjacent to the fissure for ligamentum teres, incompletely evaluated on this noncontrast examination. The gallbladder is underdistended. No radiopaque gallstones. No ascites. Pancreas: Normal noncontrast appearance of the pancreas. Spleen: The spleen appears diminutive. Adrenals/Urinary Tract: Grossly unchanged appearance of previously characterized approximately 7.9 x 7.0 cm exophytic cyst arising from the superior pole the left kidney. No discrete right-sided renal lesions on this noncontrast examination. No renal stones or urinary obstruction. Normal noncontrast appearance the bilateral adrenal glands. The urinary bladder was not imaged. Stomach/Bowel: The anterior wall the stomach is well apposed against the ventral wall of the upper abdomen without  interposition of the hepatic parenchyma, large or small bowel. Embolization coils are noted about the gastric antrum and proximal duodenum. Nonobstructive bowel gas pattern. No pneumoperitoneum, pneumatosis or portal venous gas. Vascular/Lymphatic: Minimal amount of atherosclerotic plaque within normal caliber abdominal aorta. Post IVC filter placement. No bulky retroperitoneal or mesenteric lymphadenopathy. Other: Diffuse body wall anasarca. Musculoskeletal: No acute or aggressive osseous abnormalities. Mild (approximately 25%) compression deformities involving the T11 and T12 vertebral bodies appear unchanged compared to the 03/07/2020 chest CT. IMPRESSION: 1. Gastric anatomy amenable to potential percutaneous gastrostomy tube placement as indicated. 2. Embolization coils adjacent to the gastric antrum and proximal duodenum. 3. Post IVC filter placement. 4.  Aortic Atherosclerosis (ICD10-I70.0). Electronically Signed   By: Sandi Mariscal M.D.   On: 03/23/2020 09:30   DG CHEST PORT 1 VIEW  Result Date: 03/24/2020 CLINICAL DATA:  Cardiac arrest. EXAM: PORTABLE CHEST 1 VIEW COMPARISON:  March 22, 2020. FINDINGS: The heart size  and mediastinal contours are within normal limits. Both lungs are clear. No pneumothorax or pleural effusion is noted. Tracheostomy tube is in grossly good position. The visualized skeletal structures are unremarkable. IMPRESSION: No active disease. Electronically Signed   By: Marijo Conception M.D.   On: 03/24/2020 10:51        Scheduled Meds: . chlorhexidine gluconate (MEDLINE KIT)  15 mL Mouth Rinse BID  . Chlorhexidine Gluconate Cloth  6 each Topical Q0600  . glycopyrrolate  0.1 mg Intravenous BID  . mouth rinse  15 mL Mouth Rinse 10 times per day  . pantoprazole  40 mg Intravenous Q12H  . scopolamine  1 patch Transdermal Q72H  . sodium chloride flush  10-40 mL Intracatheter Q12H  . sodium chloride flush  10-40 mL Intracatheter Q12H   Continuous Infusions: . sodium chloride  Stopped (03/13/20 1943)  . sodium chloride    . dextrose Stopped (03/24/20 1230)  . potassium chloride 10 mEq (03/24/20 1251)     LOS: 17 days        Hosie Poisson, MD [facility]   To contact the attending provider between 7A-7P or the covering provider during after hours 7P-7A, please log into the web site [email or web address] and access using universal [place] password for that web site. If you do not have the password, please call the hospital operator.  03/24/2020, 1:40 PM

## 2020-03-24 NOTE — Procedures (Signed)
Cortrak  Person Inserting Tube:  Halah Whiteside, RD Tube Type:  Cortrak - 43 inches Tube Location:  Right nare Initial Placement:  Stomach Secured by: Bridle Technique Used to Measure Tube Placement:  Documented cm marking at nare/ corner of mouth Cortrak Secured At:  72 cm   X-ray is required, abdominal x-ray has been ordered by the Cortrak team. Please confirm tube placement before using the Cortrak tube.   If the tube becomes dislodged please keep the tube and contact the Cortrak team at [email or web address] (password [email or web address]) for replacement.  If after hours and replacement cannot be delayed, place a NG tube and confirm placement with an abdominal x-ray.    Tavious Griesinger RD, LDN Clinical Nutrition Pager listed in AMION    

## 2020-03-24 NOTE — Evaluation (Signed)
Pt midline will not pull back blood. Phlebotomy notified.

## 2020-03-24 NOTE — Progress Notes (Signed)
Hypoglycemic Event  CBG: 63  Treatment: 25g  Symptoms: low Blood sugar  Follow-up CBG: Time:12:50 CBG Result:121  Possible Reasons for Event: inadequate nutrition  Comments/MD notified: MD notified. Potassium being given at this time.     Kenneth Sullivan

## 2020-03-24 NOTE — Progress Notes (Signed)
Patient has continued to be aggressive towards staff tonight. Patient continues to kick the bed, swing arms at staff and refusing any treatment. Patient allowed charge nurse Amy T RN to suction tracheostomy. Night coverage has been notified of patient behavior. Patient has pulled off condom catheter during this shift and was fighting several staff members to be cleaned up. Patient continues to look out of the window withdrawn from staff when not agitated and fighting staff members. RN has been able to administer medications through IV tonight.

## 2020-03-24 NOTE — Progress Notes (Signed)
On arrival to room, pt care in progress. VAST to return at later time.

## 2020-03-24 NOTE — Progress Notes (Signed)
Patient has high anxiety and needs Ativan. Pt will not allow you to touch his trach to clean it, suction, or change the inner cannula. RT asked nurse to reach out to the provider to prescribe an anxiety medication.

## 2020-03-24 NOTE — Progress Notes (Signed)
Hypoglycemic Event  CBG: 67  Treatment: 25g Dextrose 50% solution  Symptoms: low Blood sugar  Follow-up CBG: Time:1630 CBG Result:110  Possible Reasons for Event: inadequate nutrition  Comments/MD notified: MD Notified. Patient being given potassium at this time      [name]

## 2020-03-24 NOTE — Progress Notes (Addendum)
Patient will not allow RT to work with his trach. Pt is combative and will smack your hands and arms if you try to do anything with him. Patient is at the point of possible plugging off and his saturations are decreasing slowly, currently now at 92%. Have reached out to the provider for further assistance.

## 2020-03-24 NOTE — Progress Notes (Signed)
Nutrition Follow-up  DOCUMENTATION CODES:   Not applicable  INTERVENTION:   Begin TF via Cortrak: Jevity 1.2 at 25 ml/h, increase by 10 ml every 8 hours to goal rate of 75 ml/h. Pro-stat 30 ml once daily.  Provides 2260 kcal, 115 gm protein, 1458 ml free water daily.  Will require free water flushes 200 ml QID for a total of 2258 ml per day to meet fluid needs.  Monitor magnesium, potassium, and phosphorus BID for at least 2 days, MD to replete as needed, as pt is at risk for refeeding syndrome given minimal kcal intake for the past 10 days.  NUTRITION DIAGNOSIS:   Inadequate oral intake related to inability to eat as evidenced by NPO status.  Ongoing   GOAL:   Patient will meet greater than or equal to 90% of their needs  Unmet  MONITOR:   Vent status, Labs, I & O's  ASSESSMENT:   64 yo male admitted S/P cardiac arrest, S/P normothermia protocol. Found to have massive PE, DVT, GI bleed from gastric ulcer. PMH includes current smoker, heavy alcohol use.  Patient has been on trach collar since 4/4.  Palliative Care team is following. Niece has decided that she would like for patient to have a PEG for nutrition.  PEG tube is on back order. Cortrak tube was placed; tip is in the stomach.   When TF is started, will be at risk for refeeding syndrome due to minimal nutrition for the past 10 days. Will advance TF slowly and check mag, phos, and potassium BID x at least 2 days.   Labs reviewed. K 3 (L) CBG's: 73-77-63-121  Medications reviewed.  Diet Order:   Diet Order            Diet NPO time specified  Diet effective midnight              EDUCATION NEEDS:   Not appropriate for education at this time  Skin:  Skin Assessment: Reviewed RN Assessment(incisions to neck and groin)  Last BM:  4/8 type 6  Height:   Ht Readings from Last 1 Encounters:  03/07/20 6\' 2"  (1.88 m)    Weight:   Wt Readings from Last 1 Encounters:  03/23/20 77.8 kg    Ideal  Body Weight:  86.4 kg  BMI:  Body mass index is 22.02 kg/m.  Estimated Nutritional Needs:   Kcal:  2100-2300  Protein:  110-125 gm  Fluid:  >/= 2 L    Molli Barrows, RD, LDN, CNSC Please refer to Amion for contact information.

## 2020-03-25 ENCOUNTER — Inpatient Hospital Stay (HOSPITAL_COMMUNITY): Payer: Medicaid Other

## 2020-03-25 DIAGNOSIS — J9601 Acute respiratory failure with hypoxia: Secondary | ICD-10-CM | POA: Diagnosis not present

## 2020-03-25 DIAGNOSIS — I63429 Cerebral infarction due to embolism of unspecified anterior cerebral artery: Secondary | ICD-10-CM | POA: Diagnosis not present

## 2020-03-25 DIAGNOSIS — Z66 Do not resuscitate: Secondary | ICD-10-CM | POA: Diagnosis not present

## 2020-03-25 DIAGNOSIS — I469 Cardiac arrest, cause unspecified: Secondary | ICD-10-CM | POA: Diagnosis not present

## 2020-03-25 DIAGNOSIS — R1310 Dysphagia, unspecified: Secondary | ICD-10-CM | POA: Diagnosis not present

## 2020-03-25 LAB — BASIC METABOLIC PANEL
Anion gap: 11 (ref 5–15)
BUN: <5 mg/dL — ABNORMAL LOW (ref 8–23)
CO2: 26 mmol/L (ref 22–32)
Calcium: 8.4 mg/dL — ABNORMAL LOW (ref 8.9–10.3)
Chloride: 100 mmol/L (ref 98–111)
Creatinine, Ser: 0.74 mg/dL (ref 0.61–1.24)
GFR calc Af Amer: >60 mL/min (ref >60)
GFR calc non Af Amer: >60 mL/min (ref >60)
Glucose, Bld: 93 mg/dL (ref 70–99)
Potassium: 3.2 mmol/L — ABNORMAL LOW (ref 3.5–5.1)
Sodium: 137 mmol/L (ref 135–145)

## 2020-03-25 LAB — GLUCOSE, CAPILLARY
Glucose-Capillary: 114 mg/dL — ABNORMAL HIGH (ref 70–99)
Glucose-Capillary: 120 mg/dL — ABNORMAL HIGH (ref 70–99)
Glucose-Capillary: 145 mg/dL — ABNORMAL HIGH (ref 70–99)
Glucose-Capillary: 82 mg/dL (ref 70–99)
Glucose-Capillary: 89 mg/dL (ref 70–99)
Glucose-Capillary: 94 mg/dL (ref 70–99)

## 2020-03-25 LAB — PHOSPHORUS
Phosphorus: 2.9 mg/dL (ref 2.5–4.6)
Phosphorus: 2.7 mg/dL (ref 2.5–4.6)

## 2020-03-25 LAB — MAGNESIUM
Magnesium: 1.8 mg/dL (ref 1.7–2.4)
Magnesium: 2 mg/dL (ref 1.7–2.4)

## 2020-03-25 MED ORDER — GUAIFENESIN-DM 100-10 MG/5ML PO SYRP
10.0000 mL | ORAL_SOLUTION | ORAL | Status: DC | PRN
  Administered 2020-03-25 – 2020-04-12 (×8): 10 mL
  Filled 2020-03-25 (×8): qty 10

## 2020-03-25 MED ORDER — ACETAMINOPHEN 325 MG PO TABS
650.0000 mg | ORAL_TABLET | Freq: Four times a day (QID) | ORAL | Status: DC | PRN
  Administered 2020-03-25 – 2020-04-10 (×8): 650 mg
  Filled 2020-03-25 (×9): qty 2

## 2020-03-25 MED ORDER — METHYLPREDNISOLONE SODIUM SUCC 40 MG IJ SOLR
40.0000 mg | Freq: Four times a day (QID) | INTRAMUSCULAR | Status: DC
  Administered 2020-03-25 – 2020-03-27 (×7): 40 mg via INTRAVENOUS
  Filled 2020-03-25 (×7): qty 1

## 2020-03-25 MED ORDER — POTASSIUM CHLORIDE 20 MEQ PO PACK
40.0000 meq | PACK | Freq: Two times a day (BID) | ORAL | Status: AC
  Administered 2020-03-25 (×2): 40 meq
  Filled 2020-03-25 (×2): qty 2

## 2020-03-25 MED ORDER — SODIUM CHLORIDE 0.9 % IV SOLN: 100.0000 mg | Freq: Two times a day (BID) | INTRAVENOUS | Status: DC

## 2020-03-25 MED ORDER — DEXTROSE 5 % IV SOLN: INTRAVENOUS | Status: DC | PRN

## 2020-03-25 MED ORDER — GLYCOPYRROLATE 0.2 MG/ML IJ SOLN
0.1000 mg | Freq: Four times a day (QID) | INTRAMUSCULAR | Status: DC
  Administered 2020-03-25 (×2): 0.1 mg via INTRAVENOUS
  Filled 2020-03-25 (×2): qty 1

## 2020-03-25 MED ORDER — IPRATROPIUM-ALBUTEROL 0.5-2.5 (3) MG/3ML IN SOLN: 3.0000 mL | Freq: Four times a day (QID) | RESPIRATORY_TRACT | Status: DC

## 2020-03-25 MED ORDER — IPRATROPIUM-ALBUTEROL 0.5-2.5 (3) MG/3ML IN SOLN
3.0000 mL | Freq: Four times a day (QID) | RESPIRATORY_TRACT | Status: DC
  Administered 2020-03-25 – 2020-03-26 (×2): 3 mL via RESPIRATORY_TRACT
  Filled 2020-03-25 (×2): qty 3

## 2020-03-25 MED ORDER — MAGNESIUM SULFATE IN D5W 1-5 GM/100ML-% IV SOLN
1.0000 g | Freq: Once | INTRAVENOUS | Status: AC
  Administered 2020-03-25: 1 g via INTRAVENOUS
  Filled 2020-03-25: qty 100

## 2020-03-25 MED ORDER — DOXYCYCLINE HYCLATE 100 MG PO TABS
100.0000 mg | ORAL_TABLET | Freq: Two times a day (BID) | ORAL | Status: DC
  Administered 2020-03-25 (×2): 100 mg
  Filled 2020-03-25 (×3): qty 1

## 2020-03-25 NOTE — Progress Notes (Signed)
Sputum sample collected and sent to lab.

## 2020-03-25 NOTE — Plan of Care (Signed)
  Problem: Activity: Goal: Ability to tolerate increased activity will improve Outcome: Progressing   Problem: Respiratory: Goal: Ability to maintain a clear airway and adequate ventilation will improve Outcome: Progressing   Problem: Role Relationship: Goal: Method of communication will improve Outcome: Progressing   Problem: Activity: Goal: Ability to tolerate increased activity will improve Outcome: Progressing   Problem: Respiratory: Goal: Ability to maintain a clear airway and adequate ventilation will improve Outcome: Progressing   Problem: Role Relationship: Goal: Method of communication will improve Outcome: Progressing   Problem: Education: Goal: Knowledge of General Education information will improve Description: Including pain rating scale, medication(s)/side effects and non-pharmacologic comfort measures Outcome: Progressing   Problem: Health Behavior/Discharge Planning: Goal: Ability to manage health-related needs will improve Outcome: Progressing   Problem: Clinical Measurements: Goal: Will remain free from infection Outcome: Progressing Goal: Diagnostic test results will improve Outcome: Progressing Goal: Respiratory complications will improve Outcome: Progressing Goal: Cardiovascular complication will be avoided Outcome: Progressing   Problem: Activity: Goal: Risk for activity intolerance will decrease Outcome: Progressing   Problem: Nutrition: Goal: Adequate nutrition will be maintained Outcome: Progressing   Problem: Coping: Goal: Level of anxiety will decrease Outcome: Progressing   Problem: Elimination: Goal: Will not experience complications related to bowel motility Outcome: Progressing Goal: Will not experience complications related to urinary retention Outcome: Progressing   Problem: Pain Managment: Goal: General experience of comfort will improve Outcome: Progressing   Problem: Safety: Goal: Ability to remain free from injury  will improve Outcome: Progressing   Problem: Skin Integrity: Goal: Risk for impaired skin integrity will decrease Outcome: Progressing

## 2020-03-25 NOTE — Progress Notes (Signed)
Rapid response team updated on patient.

## 2020-03-25 NOTE — Significant Event (Signed)
Rapid Response Event Note  Overview: Time Called: [id number] Arrival Time: 1520 Event Type: MEWS Called for pt having on-going yellow and red MEWS scores. Pt is a trach, having copious thick, frothy sputum from trach site.  Initial Focused Assessment: Pt lying in bed, alert. Pt is warm. Diaphoresis noted to his face. Pt has accessory muscle use, RR 30-40s. SpO2 97-100% on 8L 35% TC. Pt is able to move all extremities. Lung sounds are rhonchus. Pt has required frequent tracheal suctioning throughout the day. Tracheal secretions are yellow, thick, frothy. Following suctioning lung sounds are clear in all lobes except the right upper lobe which fine coarse breath sounds can be heard. Pt quickly produces more tracheal secretions. Pt appears tired compared to my assessment of him on Wednesday (03/22/20). Pt has apparently had progressively increased work of breathing over the last three days.   Interventions: Dr. Karleen Hampshire paged to bedside, orders received -Robinul switched to Guaifenesin -CXR ordered  Plan of Care (if not transferred): -Trach care as needed  Call rapid response for further needs.  Event Summary: Name of Physician Notified: Dr. Karleen Hampshire at [place] in room and stabalized Event End Time: [place]

## 2020-03-25 NOTE — Progress Notes (Signed)
PROGRESS NOTE    Kenneth Sullivan  ION:[id number] DOB: [birth year] DOA: 03/07/2020 PCP: Medicine, [name]  Brief Narrative:  64 year old gentleman with no significant past medical history presents with shortness of breath and hypoxic had a witnessed bradycardic asystolic arrest underwent normothermia protocol was found to have significant massive PE, DVT with RV strain and acute GI bleed from gastric ulcer.  Patient was admitted on March 07 2020.  When EGD showing oozing gastric ulcer with clot.  He underwent IR guided mechanical thrombectomy, IVC filter placement and GDA embolization.  His hospital course was complicated by multifocal acute to subacute ischemic infarcts involving bilateral cerebral hemispheres consistent with global hypoperfusion related to cardiac arrest associated with scattered petechial hemorrhages with evidence of hemorrhagic conversion in the right parietal lobe. Patient seen and examined today he appears a little calmer when compared to yesterday.  Core track in place and tube feeds have been started.  As patient continued to have increased secretions orally and he is unable to swallow he has continuous drooling and in addition he has copious yellow-colored secretions from the trach.  Chest x-ray remains clear and minimal elevation in WBC count, afebrile.  Obtain tracheal aspirate send for cultures, started the patient on doxycycline for possible tracheobronchitis and Robinul for increased secretions.   Assessment & Plan:   Principal Problem:   Cardiac arrest [facility]) Active Problems:   Encounter for central line placement   GI bleed   Acute respiratory failure (HCC)   Anoxic encephalopathy (HCC)   Cerebral embolism with cerebral infarction   Pulmonary emboli ([place])   Palliative care by specialist   Goals of care, counseling/discussion   DNR (do not resuscitate)   Cardiac arrest secondary to massive pulmonary embolism S/p  3 rounds of epi, 20 min to ROSC,  underwent normothermic protocol.  MRI of the brain shows multiple acute to subacute ischemic infarcts involving bilateral cerebral hemispheres consistent with global hypoperfusion related to cardiac arrest.  S/p IVC filter placement Supportive management.   Anoxic encephalopathy CVAs with hemorrhagic conversion MRI showing multiple acute and subacute infarcts with evidence of hemorrhagic conversion Poor prognosis for any meaningful recovery. Palliative care consulted and after goals of care discussions alone, family would like to pursue aggressive management including PEG placement and LTAC placement Family ( his niece) would like to pursue  feeding tubes and  LTAC placement. IR consulted for PEG placement.  To be scheduled next week.    Acute respiratory failure with hypoxia secondary to massive PE with cor pulmonale S/p intubation on admission and s/p trach placement on 03/20/2020.  S/p mechanical thrombectomy by IR and IVC filter placement for the acute DVT, unable to anti coagulate due to hemorrhagic conversion of the multiple infarcts.  Currently on trach collar and he is able to tolerate but continues to have copious, thick secretions.  Robinul added in addition to scopolamine patch to help with secretions.  Doxycycline added for tracheobronchitis and tracheal aspirate sent for cultures. Chest x-ray does not show any infiltrates. Continue with pulmonary hygiene. PCCM plan to change the trach collar to cuffless on Monday and remove the sutures. Speech evaluation recommending  Pass[place] muir Speech valve onl with SLP with full supervision.      Acute GI bleed secondary to large gastric ulcer S/p GDA embolization. Resume PPI.  Change to via tube   Nutrition: malnourished.  IR consulted for PEG placement to be scheduled next week  Persistent hypokalemia Repeat potassium this morning is 3.2 and magnesium  is 1.8.    Acute anemia of blood loss  Hemoglobin stable around 9  transfuse  to keep hemoglobin greater than 7.    Agitation Patient is frustrated about not being able to swallow. As needed Haldol/Ativan ordered.      DVT prophylaxis: SCDs Code Status: DNR Family Communication: None at bedside Disposition Plan:  . Patient came from: Home           . Anticipated d/c place: Pending Barriers to d/c OR conditions which need to be met to effect a safe d/c: PEG placement pending  Consultants:   Gastroenterology  Cardiology  IR  Neurology  Procedures:  CTA 3/23 >> bilateral PE with RV/with ratio 1.9, emphysema CT head 3/23 >> chronic microvascular changes.  No acute findings Echo 3/23 >> RV is severely dilated with reduced function and D-shaped septum, LVEF 60-65%. LE Venous Duplex 3/24 >> acute DVT in BLE up to the femoral vein  MRI Brain 3/28 >> multifocal acute to subacute ischemic infarcts involving bilateral cerebral hemispheres, findings consistent with global hypoperfusion event related to cardiac arrest, associated scattered petechial hemorrhage about many areas of ischemia, evidence of hemorrhagic conversion in the right parietal lobe. ETT 3/23 >>4/5 Trach 4/5>> L TLC 3/23 >>  ALine L Fem 3/23 >> 3/30    Antimicrobials: None   Subjective: Patient is more calm today denies any chest pain.  Still is tachypneic with copious secretions.  Objective: Vitals:   03/25/20 1142 03/25/20 1405 03/25/20 1430 03/25/20 1435  BP:  110/64  102/74  Pulse: (!) 111 (!) 107  83  Resp: (!) 29 (!) 29 (!) 29 (!) 38  Temp:    99.9 F (37.7 C)  TempSrc:    Axillary  SpO2:  99%  100%  Weight:      Height:        Intake/Output Summary (Last 24 hours) at 03/25/2020 1444 Last data filed at 03/25/2020 0618 Gross per 24 hour  Intake 1085.68 ml  Output 1750 ml  Net -664.32 ml   Filed Weights   03/20/20 0500 03/23/20 0342 03/25/20 0500  Weight: 76.1 kg 77.8 kg 77.7 kg    Examination:  General exam: Ill-appearing gentleman cachectic looking s/p trach  on 10 L of oxygen Respiratory system: AIR entry fair, coarse breath sounds in the upper lobes. Tachypnea,  Cardiovascular system: S1S2, tachycardia, no JVD.  Gastrointestinal system: Abdomen is soft, non tender non distended bowel sounds.  Central nervous system: alert, communicating.  Extremities: no pedal edema.  Skin: excoriations of the skin.  Psychiatry: Cannot be assessed    Data Reviewed: I have personally reviewed following labs and imaging studies  CBC: Recent Labs  Lab 03/23/20 0945 03/24/20 1000  WBC 14.3* 11.8*  HGB 11.0* 9.4*  HCT 33.5* 28.7*  MCV 96.3 94.1  PLT 470* 032*   Basic Metabolic Panel: Recent Labs  Lab 03/21/20 1012 03/22/20 0309 03/23/20 0945 03/24/20 1000 03/24/20 2054 03/25/20 1114  NA 138 136 136 135  --  137  K 2.8* 2.9* 3.1* 3.0*  --  3.2*  CL 100 98 100 98  --  100  CO2 _0 --  26  GLUCOSE 102* 96 89 83  --  93  BUN 9 6* <5* <5*  --  <5*  CREATININE 0.76 0.77 0.70 0.77  --  0.74  CALCIUM 8.4* 8.1* 8.5* 8.3*  --  8.4*  MG  --  1.7  --  1.7 1.7 1.8  PHOS  --  2.7  --   --  2.6 2.9   GFR: Estimated Creatinine Clearance: 105.2 mL/min (by C-G formula based on SCr of 0.74 mg/dL). Liver Function Tests: No results for input(s): AST, ALT, ALKPHOS, BILITOT, PROT, ALBUMIN in the last 168 hours. No results for input(s): LIPASE, AMYLASE in the last 168 hours. No results for input(s): AMMONIA in the last 168 hours. Coagulation Profile: No results for input(s): INR, PROTIME in the last 168 hours. Cardiac Enzymes: No results for input(s): CKTOTAL, CKMB, CKMBINDEX, TROPONINI in the last 168 hours. BNP (last 3 results) No results for input(s): PROBNP in the last 8760 hours. HbA1C: No results for input(s): HGBA1C in the last 72 hours. CBG: Recent Labs  Lab 03/24/20 2022 03/24/20 2333 03/25/20 0313 03/25/20 0735 03/25/20 1137  GLUCAP 75 93 82 94 89   Lipid Profile: No results for input(s): CHOL, HDL, LDLCALC, TRIG, CHOLHDL,  LDLDIRECT in the last 72 hours. Thyroid Function Tests: No results for input(s): TSH, T4TOTAL, FREET4, T3FREE, THYROIDAB in the last 72 hours. Anemia Panel: No results for input(s): VITAMINB12, FOLATE, FERRITIN, TIBC, IRON, RETICCTPCT in the last 72 hours. Sepsis Labs: No results for input(s): PROCALCITON, LATICACIDVEN in the last 168 hours.  No results found for this or any previous visit (from the past 240 hour(s)).       Radiology Studies: DG CHEST PORT 1 VIEW  Result Date: 03/24/2020 CLINICAL DATA:  Cardiac arrest. EXAM: PORTABLE CHEST 1 VIEW COMPARISON:  March 22, 2020. FINDINGS: The heart size and mediastinal contours are within normal limits. Both lungs are clear. No pneumothorax or pleural effusion is noted. Tracheostomy tube is in grossly good position. The visualized skeletal structures are unremarkable. IMPRESSION: No active disease. Electronically Signed   By: Marijo Conception M.D.   On: 03/24/2020 10:51   DG Abd Portable 1V  Result Date: 03/25/2020 CLINICAL DATA:  NG tube placement EXAM: PORTABLE ABDOMEN - 1 VIEW COMPARISON:  CT abdomen 03/23/2020 FINDINGS: Interval placement of feeding tube with tip in the body of the stomach. Tip pointed towards the pylorus. No dilated large or small bowel.  IVC filter in GDA coils noted. IMPRESSION: 1. Feeding tube with tip in the gastric body pointed towards the pylorus. 2. No bowel obstruction Electronically Signed   By: Suzy Bouchard M.D.   On: 03/25/2020 09:09        Scheduled Meds: . chlorhexidine gluconate (MEDLINE KIT)  15 mL Mouth Rinse BID  . Chlorhexidine Gluconate Cloth  6 each Topical Q0600  . doxycycline  100 mg Per Tube Q12H  . feeding supplement (PRO-STAT SUGAR FREE 64)  30 mL Per Tube Daily  . free water  200 mL Per Tube Q6H  . glycopyrrolate  0.1 mg Intravenous QID  . mouth rinse  15 mL Mouth Rinse 10 times per day  . pantoprazole  40 mg Intravenous Q12H  . scopolamine  1 patch Transdermal Q72H  . sodium chloride  flush  10-40 mL Intracatheter Q12H  . sodium chloride flush  10-40 mL Intracatheter Q12H   Continuous Infusions: . sodium chloride Stopped (03/13/20 1943)  . sodium chloride    . dextrose 50 mL/hr at 03/25/20 0946  . feeding supplement (JEVITY 1.2 CAL) 75 mL/hr at 03/25/20 1230     LOS: 18 days        Hosie Poisson, MD [facility]   To contact the attending provider between 7A-7P or the covering provider during after hours 7P-7A, please log into the web site [email or web address] and  access using universal [place] password for that web site. If you do not have the password, please call the hospital operator.  03/25/2020, 2:44 PM

## 2020-03-25 NOTE — Progress Notes (Signed)
NAME:  Kenneth Sullivan, MRN:  [id number], DOB:  01/31/1956, LOS: 98 ADMISSION DATE:  03/07/2020, CONSULTATION DATE:  03/07/20 REFERRING MD:  Dr Roxanne Mins MD, CHIEF COMPLAINT:  Cardiac arrest  Brief History   64 year old with no significant past medical history.  Had acute shortness of breath with sats in the 40s on EMS arrival.  He had a witnessed bradycardic, asystolic arrest when EMS was present with 3 rounds of epi, 20 minutes to ROSC. Underwent normothermia protocol.  Work-up significant for massive PE, DVT with RV strain, GI bleed from gastric ulcer.  Past Medical History  Has not seen a doctor.  No past medical history. Smokes 1 pack/day, drinks 2 to 3 40 ounce beers every day.  Smokes marijuana.  Denies any other recreational drug use.  Significant Hospital Events   3/23 Admit, EGD with findings of oozing gastric ulcer with clot, IR for mechanical thrombectomy, IVC filter and GDA embolization 3/24 Started heparin drip  Consults:  Cardiology, GI, interventional radiology, neurolgoy  Procedures:  ETT 3/23 >>4/5 Trach 4/5>> L TLC 3/23 >>  ALine L Fem 3/23 >> 3/30  Significant Diagnostic Tests:  CTA 3/23 >> bilateral PE with RV/with ratio 1.9, emphysema CT head 3/23 >> chronic microvascular changes.  No acute findings Echo 3/23 >> RV is severely dilated with reduced function and D-shaped septum, LVEF 60-65%. LE Venous Duplex 3/24 >> acute DVT in BLE up to the femoral vein  MRI Brain 3/28 >> multifocal acute to subacute ischemic infarcts involving bilateral cerebral hemispheres, findings consistent with global hypoperfusion event related to cardiac arrest, associated scattered petechial hemorrhage about many areas of ischemia, evidence of hemorrhagic conversion in the right parietal lobe  Micro Data:  Bcx 3/23 >> negative [name] 3/23 >> negative BCx2 3/23 >> negative   Antimicrobials:    Interim history/subjective:  Increased secretions with difficulty breathing today Rapid  response called Patient was suctioned  Objective   Blood pressure (!) 116/92, pulse (!) 119, temperature 98.9 F (37.2 C), temperature source Axillary, resp. rate (!) 35, height 6\' 2"  (1.88 m), weight 77.7 kg, SpO2 99 %.    FiO2 (%):  [28 %-35 %] 35 %   Intake/Output Summary (Last 24 hours) at 03/25/2020 1713 Last data filed at 03/25/2020 1600 Gross per 24 hour  Intake 548.24 ml  Output 1150 ml  Net -601.76 ml   Filed Weights   03/20/20 0500 03/23/20 0342 03/25/20 0500  Weight: 76.1 kg 77.8 kg 77.7 kg    Examination:  General: Cachectic male, tachypneic HEENT: No JVD or lymphadenopathy is appreciated.  Tracheostomy site is unremarkable  neuro: Intermittently follows commands CV: Heart sounds are regular, S1-S2 appreciated PULM: Rhonchi Extremities: warm/dry,  edema  Skin: no rashes or lesions    Resolved Hospital Problem list   Hypoglycemia Transaminitis  Assessment & Plan:   Cardiac Arrest secondary to Massive PE Asystole as initial rhythm secondary to massive PE, downtime ~ 20 minutes.  Initial CT head w/o acute abnormality. S/P TTM.  Follow up MRI with strokes and hemorrhagic conversion.  IVC filter in place, no plans for reinitiation of heparin due to hemorrhagic conversion of previous CVAs. Continue supportive care  Tracheobronchitis -Doxycycline started -Steroids-Solu-Medrol added -DuoNeb bronchodilators 4 times daily -Intermittent suctioning as needed -Chest x-ray performed today shows no infiltrate  Anoxic Encephalopathy CVA's with Hemorrhagic Conversion  MRI with multiple acute and subacute infarcts with evidence of hemorrhagic conversion. Affecting language and speech center of brain. Repeat neurological consult on 03/16/2020 >> poor  prognosis for any meaningful recovery. Patient's family have expressed wishes that they would not want LTAC placement. They also do not want him to have a feeding tube if at all possible.Marland Kitchen  He is following commands of  03/21/2020 Therefore consideration for LTAC and tube feedings will have to be entertained.  Acute Respiratory Failure secondary to Massive PE with Acute Cor Pulmonale Status post mechanical thrombectomy and IVC filter placement  Currently on trach collar was no plans for further ventilatory support  Acute Upper GI bleed secondary to large Gastric Ulcer Status post GDA embolization We will need GI services input as to starting tube feedings  From records documented, No escalation to ventilator No PEG tube placement Family does not want him placed in LTAC  Continue pulmonary toileting  Will continue to follow  Sherrilyn Rist, MD [place] [facility] Pager: [phone]

## 2020-03-25 NOTE — Progress Notes (Signed)
MD paged to make aware of concerns. Since this shift patient has noted to have copious secretions requiring trach care every 30 minutes. Pt unable to verbalize or use call bell for any requests for help.Pt respirations increased to 37 rpm with a decrease in O2 sats to 90% from 97 within 5-10 seconds and gradual return to O2 sat of 97% with a respiration of 29. Nurse during night shift also made this nurse aware at bedside report that patient had to be suctioned no less than every hour during shift, with periods of indepth trach cleaning. MD made aware that this nurse recommendation is for a nurse who may be able to provide more one on one care for patient.

## 2020-03-25 NOTE — Progress Notes (Signed)
Assisted tele visit to patient with family member.  Amberia Bayless M, RN  

## 2020-03-25 NOTE — Progress Notes (Signed)
Charge nurse made aware of care needed for patient and that MD was being notified.

## 2020-03-25 NOTE — Progress Notes (Signed)
Video conference with patient niece and patient. Pulmonologist entered room latter during call. Niece describes that patient is doing much better and is getting well upon looking at patient on video call. Niece re-educated about normal parameters for hr, respirations and O2 sats. Niece stated that she noticed is tired from all the pain medications being given. Niece informed patient had not been given pain medication at this time, informed about patient use of muscles for breathing. Niece informed how often patient has required staff interventions for breathing. Niece was given opportunity by pulmonologist to ask questions.

## 2020-03-25 NOTE — Progress Notes (Signed)
RT note-In patient for routine trach care. Rapid response in room, patient has increased work of breathing and secretions, copious thick yellow. Trach care assessed, sutures removed, ties changed. Dr. Karleen Hampshire called and at bedside at this time to assess. Patient was bagged for airway efficiency, easy, BBS equal slightly diminished with scattered rhonchi. Informed RRT nurse to call if patient is going to be moved. Continue to monitor.

## 2020-03-26 ENCOUNTER — Inpatient Hospital Stay (HOSPITAL_COMMUNITY): Payer: Medicaid Other

## 2020-03-26 DIAGNOSIS — I469 Cardiac arrest, cause unspecified: Secondary | ICD-10-CM | POA: Diagnosis not present

## 2020-03-26 DIAGNOSIS — I63429 Cerebral infarction due to embolism of unspecified anterior cerebral artery: Secondary | ICD-10-CM | POA: Diagnosis not present

## 2020-03-26 DIAGNOSIS — J9601 Acute respiratory failure with hypoxia: Secondary | ICD-10-CM | POA: Diagnosis not present

## 2020-03-26 DIAGNOSIS — R1310 Dysphagia, unspecified: Secondary | ICD-10-CM | POA: Diagnosis not present

## 2020-03-26 LAB — GLUCOSE, CAPILLARY
Glucose-Capillary: 144 mg/dL — ABNORMAL HIGH (ref 70–99)
Glucose-Capillary: 120 mg/dL — ABNORMAL HIGH (ref 70–99)
Glucose-Capillary: 113 mg/dL — ABNORMAL HIGH (ref 70–99)
Glucose-Capillary: 112 mg/dL — ABNORMAL HIGH (ref 70–99)
Glucose-Capillary: 111 mg/dL — ABNORMAL HIGH (ref 70–99)

## 2020-03-26 LAB — PHOSPHORUS: Phosphorus: 2.2 mg/dL — ABNORMAL LOW (ref 2.5–4.6)

## 2020-03-26 MED ORDER — MIDAZOLAM HCL 2 MG/2ML IJ SOLN: 2.0000 mg | Freq: Once | INTRAMUSCULAR | Status: DC

## 2020-03-26 MED ORDER — MIDAZOLAM HCL 2 MG/2ML IJ SOLN: 2.0000 mg | Freq: Once | INTRAMUSCULAR | Status: AC

## 2020-03-26 MED ORDER — SODIUM CHLORIDE 0.9 % IV SOLN
100.0000 mg | Freq: Two times a day (BID) | INTRAVENOUS | Status: DC
  Administered 2020-03-26 – 2020-04-01 (×13): 100 mg via INTRAVENOUS
  Filled 2020-03-26 (×15): qty 100

## 2020-03-26 MED ORDER — QUETIAPINE FUMARATE 25 MG PO TABS: 12.5000 mg | ORAL_TABLET | Freq: Every day | ORAL | Status: DC

## 2020-03-26 MED ORDER — MIDAZOLAM HCL 2 MG/2ML IJ SOLN
INTRAMUSCULAR | Status: AC
  Administered 2020-03-26: 2 mg via INTRAVENOUS
  Filled 2020-03-26: qty 2

## 2020-03-26 MED ORDER — QUETIAPINE FUMARATE 25 MG PO TABS
12.5000 mg | ORAL_TABLET | Freq: Every day | ORAL | Status: DC
  Administered 2020-03-28 – 2020-04-12 (×16): 12.5 mg
  Filled 2020-03-26 (×19): qty 1

## 2020-03-26 NOTE — Progress Notes (Addendum)
Low bed ordered. Fall mats in place. Bed alarm on. On call contacted re: patient safety for fall & for renewal of safety sitter order. On call hospitalist was paged early in shift to clarify Fentanyl orders.  2256: Lap belt in place. Pt previously intentionally spit on this RN. Pt now actively throwing legs out of bed and intentionally kicking staff in the stomach. Low bed ready for pt to be transferred into, but pt is too violent at this time.  0125: This RN received in report that pt's cortrak was broken and unusable, at the end and also somewhere along the tube - upon start of shift, cortrak had been leaking. Tube was then clamped off. Pt at this time was observed to be attempting to bite the cortrak tube.  0205: Pt allowed this RN and NT to move him to the low bed. Pt received deep suctioning x2 so far this shift.   0426: Pt continues to remove trach shield. Pt in mittens.

## 2020-03-26 NOTE — Progress Notes (Signed)
NAME:  Kenneth Sullivan, MRN:  [id number], DOB:  1956/12/09, LOS: 60 ADMISSION DATE:  03/07/2020, CONSULTATION DATE:  03/07/20 REFERRING MD:  Dr Roxanne Mins MD, CHIEF COMPLAINT:  Cardiac arrest  Brief History   64 year old with no significant past medical history.  Had acute shortness of breath with sats in the 40s on EMS arrival.  He had a witnessed bradycardic, asystolic arrest when EMS was present with 3 rounds of epi, 20 minutes to ROSC. Underwent normothermia protocol.  Work-up significant for massive PE, DVT with RV strain, GI bleed from gastric ulcer.  Past Medical History  Has not seen a doctor.  No past medical history. Smokes 1 pack/day, drinks 2 to 3 40 ounce beers every day.  Smokes marijuana.  Denies any other recreational drug use.  Significant Hospital Events   3/23 Admit, EGD with findings of oozing gastric ulcer with clot, IR for mechanical thrombectomy, IVC filter and GDA embolization 3/24 Started heparin drip  Consults:  Cardiology, GI, interventional radiology, neurolgoy  Procedures:  ETT 3/23 >>4/5 Trach 4/5 >> L TLC 3/23 >>  ALine L Fem 3/23 >> 3/30  Significant Diagnostic Tests:  CTA 3/23 >> bilateral PE with RV/with ratio 1.9, emphysema CT head 3/23 >> chronic microvascular changes.  No acute findings Echo 3/23 >> RV is severely dilated with reduced function and D-shaped septum, LVEF 60-65%. LE Venous Duplex 3/24 >> acute DVT in BLE up to the femoral vein  MRI Brain 3/28 >> multifocal acute to subacute ischemic infarcts involving bilateral cerebral hemispheres, findings consistent with global hypoperfusion event related to cardiac arrest, associated scattered petechial hemorrhage about many areas of ischemia, evidence of hemorrhagic conversion in the right parietal lobe  Micro Data:  Bcx 3/23 >> negative [name] 3/23 >> negative BCx2 3/23 >> negative   Antimicrobials:    Interim history/subjective:  Pt remains on ATC 5L/28% O2   Objective   Blood pressure  (!) 143/103, pulse (!) 114, temperature 98.3 F (36.8 C), temperature source Oral, resp. rate (!) 30, height 6\' 2"  (1.88 m), weight 77.7 kg, SpO2 99 %.    FiO2 (%):  [28 %-35 %] 28 %   Intake/Output Summary (Last 24 hours) at 03/26/2020 1140 Last data filed at 03/26/2020 0008 Gross per 24 hour  Intake 882 ml  Output 1400 ml  Net -518 ml   Filed Weights   03/20/20 0500 03/23/20 0342 03/25/20 0500  Weight: 76.1 kg 77.8 kg 77.7 kg    Examination:  General: chronically ill appearing male lying in bed in NAD  HEENT: MM pink/moist, trach midline with creamy secretions, suctioned at bedside with small amt secretions returned Neuro: awake, responds to voice, mouths "good morning", intermittently follows commands  CV: s1s2 rrr, no m/r/g PULM: non-labored on ATC, tachypnea but no distress, lungs bilaterally with wheezing, rhonchi GI: soft, bsx4 active  Extremities: warm/dry, no edema  Skin: no rashes or lesions  Resolved Hospital Problem list   Hypoglycemia Transaminitis  Assessment & Plan:   Cardiac Arrest secondary to Massive PE Asystole as initial rhythm secondary to massive PE, downtime ~ 20 minutes.  Initial CT head w/o acute abnormality. S/P TTM.  Follow up MRI with strokes and hemorrhagic conversion.  IVC filter in place, no plans for reinitiation of heparin due to hemorrhagic conversion of previous CVAs. -continue supportive care   Tracheobronchitis -continue doxycycline  -continue steroids  -continue Duonebs -NTS PRN  -follow intermittent CXR  Anoxic Encephalopathy CVA's with Hemorrhagic Conversion  MRI with multiple acute and  subacute infarcts with evidence of hemorrhagic conversion. Affecting language and speech center of brain. Repeat neurological consult on 03/16/2020 >> poor prognosis for any meaningful recovery. -patient's family have expressed desire for no LTAC   -continue rehab efforts  Acute Respiratory Failure secondary to Massive PE with Acute Cor  Pulmonale Status post mechanical thrombectomy and IVC filter placement -continue trach care per protocol  -wean O2 for sats >90% -NTS PRN -pulmonary hygiene  Acute Upper GI bleed secondary to large Gastric Ulcer Status post GDA embolization -defer feeding decision to GI, primary   [place] From records documented: No escalation to ventilator No PEG tube placement Family does not want him placed in [place], MSN, NP-C [facility] 03/26/2020, 11:50 AM   Please see [email or web address] for pager details.

## 2020-03-26 NOTE — Progress Notes (Signed)
Family made aware of patient events and behaviors today.

## 2020-03-26 NOTE — Progress Notes (Signed)
Patient pulled out his trach and rolled onto floor. Trach replaced by RT without issue. No desats. Patient picked up and placed back in bed. Sitter to be ordered. Primary at bedside.  Erskine Emery MD PCCM

## 2020-03-26 NOTE — Progress Notes (Signed)
PROGRESS NOTE    Kenneth Sullivan  YOV:[id number] DOB: 24-Sep-1956 DOA: 03/07/2020 PCP: Medicine, [name]  Brief Narrative:  64 year old gentleman with no significant past medical history presents with shortness of breath and hypoxic had a witnessed bradycardic asystolic arrest underwent normothermia protocol was found to have significant massive PE, DVT with RV strain and acute GI bleed from gastric ulcer.  Patient was admitted on March 07 2020.  When EGD showing oozing gastric ulcer with clot.  He underwent IR guided mechanical thrombectomy, IVC filter placement and GDA embolization.  His hospital course was complicated by multifocal acute to subacute ischemic infarcts involving bilateral cerebral hemispheres consistent with global hypoperfusion related to cardiac arrest associated with scattered petechial hemorrhages with evidence of hemorrhagic conversion in the right parietal lobe. Patient seen and examined today he appears a little calmer when compared to yesterday.  Core track in place and tube feeds have been started.  As patient continued to have increased secretions orally and he is unable to swallow he has continuous drooling and in addition he has copious yellow-colored secretions from the trach.  Chest x-ray remains clear and minimal elevation in WBC count, afebrile.  Obtain tracheal aspirate send for cultures, started the patient on doxycycline for possible tracheobronchitis and Robinul for increased secretions. 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  MRI of the brain shows multiple acute to subacute ischemic infarcts involving bilateral cerebral hemispheres consistent with global hypoperfusion related to cardiac arrest.  S/p IVC filter placement Continue with supportive management   Anoxic encephalopathy CVAs with hemorrhagic conversion MRI showing multiple acute and subacute infarcts with evidence of hemorrhagic conversion Poor prognosis for any meaningful recovery. Palliative care consulted and after goals of care discussions alone, family would like to pursue aggressive management including PEG placement and LTAC placement Family ( his niece) would like to pursue  feeding tubes and  LTAC placement. IR consulted for PEG placement.  To be scheduled next week.    Acute respiratory failure with hypoxia secondary to massive PE with cor pulmonale S/p intubation on admission and s/p trach placement on 03/20/2020.  S/p mechanical thrombectomy by IR and IVC filter placement for the acute DVT, unable to anti coagulate due to hemorrhagic conversion of the multiple infarcts.  Currently on trach collar and he is able to tolerate but continues to have copious, thick secretions.  Robinul added in addition to scopolamine patch to help with secretions.  Doxycycline added for tracheobronchitis and tracheal aspirate sent for cultures. IV steroids started by [facility] for tracheobronchitis. Tracheal aspirate shows rare WBC, few gram-positive cocci in pairs and few gram-negative rods in the aspirate incubated for better growth.  Repeat blood cultures ordered and are pending Chest x-ray does not show any infiltrates. Continue with pulmonary hygiene. PCCM plan to change the trach collar to cuffless on Monday and remove the sutures. Speech evaluation recommending  Passy muir Speech valve onl with SLP with full supervision.  Acute GI bleed secondary to large gastric  ulcer S/p GDA embolization. Resume PPI.    Nutrition: malnourished.  IR consulted for PEG placement to be scheduled next week. Core track placed and tubes feeds started but unfortunately started leaking and the tube feeds were held.  Persistent hypokalemia Replaced    Acute anemia of blood loss  Hemoglobin stable around 9  transfuse to keep hemoglobin greater than 7.    Agitation Patient is frustrated about not being able to swallow. As needed Haldol/Ativan ordered.    Patient was found on the floor and his trach was dislodged. PCCM replaced and he was given a dose of Versed.  CT of the head ordered and is pending. Right shoulder and pelvis x-rays are negative for fractures.  DVT prophylaxis: SCDs Code Status: DNR Family Communication: None at bedside Disposition Plan:  . Patient came from: Home           . Anticipated d/c place: Pending Barriers to d/c OR conditions which need to be met to effect a safe d/c: PEG placement pending  Consultants:   Gastroenterology  Cardiology  IR  Neurology  Procedures:  CTA 3/23 >> bilateral PE with RV/with ratio 1.9, emphysema CT head 3/23 >> chronic microvascular changes.  No acute findings Echo 3/23 >> RV is severely dilated with reduced function and D-shaped septum, LVEF 60-65%. LE Venous Duplex 3/24 >> acute DVT in BLE up to the femoral vein  MRI Brain 3/28 >> multifocal acute to subacute ischemic infarcts involving bilateral cerebral hemispheres, findings consistent with global hypoperfusion event related to cardiac arrest, associated scattered petechial hemorrhage about many areas of ischemia, evidence of hemorrhagic conversion in the right parietal lobe. ETT 3/23 >>4/5 Trach 4/5>> L TLC 3/23 >>  ALine L Fem 3/23 >> 3/30    Antimicrobials: None   Subjective: Patient was found on the floor earlier this afternoon and his trach was dislodged he was bagged and PCCM secured the trach..  1 bowel movement  today  Objective: Vitals:   03/26/20 0345 03/26/20 0420 03/26/20 0744 03/26/20 0820  BP: 101/69  (!) 135/95 (!) 143/103  Pulse: (!) 101 (!) 105  (!) 111  Resp: (!) 30 (!) 28  (!) 34  Temp: 97.9 F (36.6 C)  98.3 F (36.8 C)   TempSrc: Oral  Oral   SpO2: 100% 100%  100%  Weight:      Height:        Intake/Output Summary (Last 24 hours) at 03/26/2020 1003 Last data filed at 03/26/2020 0008 Gross per 24 hour  Intake 882 ml  Output 1400 ml  Net -518 ml   Filed Weights   03/20/20 0500 03/23/20 0342 03/25/20 0500  Weight: 76.1 kg 77.8 kg 77.7 kg    Examination:  General exam: Ill-appearing, cachectic looking gentleman s/p trach on 10 L of oxygen Respiratory system: Coarse breath sounds bilateral, air entry fair, tachypneic Cardiovascular system: S1-S2 heard, tachycardic, regular, no JVD trace pedal edema present Gastrointestinal system: Abdomen is soft, nontender bowel sounds normal Central nervous system: Agitated, able to move all extremities Extremities: Trace pedal edema Skin: Excoriations on the back Psychiatry: Cannot be assessed    Data Reviewed: I have personally reviewed following labs and imaging studies  CBC: Recent Labs  Lab 03/23/20 0945 03/24/20 1000  WBC 14.3* 11.8*  HGB 11.0* 9.4*  HCT 33.5* 28.7*  MCV 96.3 94.1  PLT 470* 378*   Basic Metabolic Panel: Recent Labs  Lab 03/21/20 1012 03/22/20 0309 03/23/20  0945 03/24/20 1000 03/24/20 2054 03/25/20 1114 03/25/20 1639  NA 138 136 136 135  --  137  --   K 2.8* 2.9* 3.1* 3.0*  --  3.2*  --   CL 100 98 100 98  --  100  --   CO2 _0 --  26  --   GLUCOSE 102* 96 89 83  --  93  --   BUN 9 6* <5* <5*  --  <5*  --   CREATININE 0.76 0.77 0.70 0.77  --  0.74  --   CALCIUM 8.4* 8.1* 8.5* 8.3*  --  8.4*  --   MG  --  1.7  --  1.7 1.7 1.8 2.0  PHOS  --  2.7  --   --  2.6 2.9 2.7   GFR: Estimated Creatinine Clearance: 105.2 mL/min (by C-G formula based on SCr of 0.74 mg/dL). Liver Function  Tests: No results for input(s): AST, ALT, ALKPHOS, BILITOT, PROT, ALBUMIN in the last 168 hours. No results for input(s): LIPASE, AMYLASE in the last 168 hours. No results for input(s): AMMONIA in the last 168 hours. Coagulation Profile: No results for input(s): INR, PROTIME in the last 168 hours. Cardiac Enzymes: No results for input(s): CKTOTAL, CKMB, CKMBINDEX, TROPONINI in the last 168 hours. BNP (last 3 results) No results for input(s): PROBNP in the last 8760 hours. HbA1C: No results for input(s): HGBA1C in the last 72 hours. CBG: Recent Labs  Lab 03/25/20 1613 03/25/20 1939 03/25/20 2330 03/26/20 0344 03/26/20 0746  GLUCAP 114* 120* 145* 144* 120*   Lipid Profile: No results for input(s): CHOL, HDL, LDLCALC, TRIG, CHOLHDL, LDLDIRECT in the last 72 hours. Thyroid Function Tests: No results for input(s): TSH, T4TOTAL, FREET4, T3FREE, THYROIDAB in the last 72 hours. Anemia Panel: No results for input(s): VITAMINB12, FOLATE, FERRITIN, TIBC, IRON, RETICCTPCT in the last 72 hours. Sepsis Labs: No results for input(s): PROCALCITON, LATICACIDVEN in the last 168 hours.  Recent Results (from the past 240 hour(s))  Culture, respiratory (non-expectorated)     Status: None (Preliminary result)   Collection Time: 03/25/20 12:02 PM   Specimen: Tracheal Aspirate; Respiratory  Result Value Ref Range Status   Specimen Description TRACHEAL ASPIRATE  Final   Special Requests NONE  Final   Gram Stain   Final    RARE WBC PRESENT, PREDOMINANTLY MONONUCLEAR FEW GRAM POSITIVE COCCI IN PAIRS FEW GRAM NEGATIVE RODS Performed at [facility], [place] [address] [place], [place] [address]    Culture PENDING  Incomplete   Report Status PENDING  Incomplete         Radiology Studies: DG CHEST PORT 1 VIEW  Result Date: 03/25/2020 CLINICAL DATA:  Cardiac arrest EXAM: PORTABLE CHEST 1 VIEW COMPARISON:  03/24/2020 FINDINGS: Tracheostomy tube in situ terminating in proximal trachea.  Feeding tube in the mid stomach. Cardiomediastinal contours hilar structures are unremarkable. Lungs are clear. No acute bone finding. IMPRESSION: 1. No acute cardiopulmonary disease. 2. Tracheostomy tube in situ terminating in proximal trachea. Electronically Signed   By: Zetta Bills M.D.   On: 03/25/2020 17:47   DG Abd Portable 1V  Result Date: 03/25/2020 CLINICAL DATA:  NG tube placement EXAM: PORTABLE ABDOMEN - 1 VIEW COMPARISON:  CT abdomen 03/23/2020 FINDINGS: Interval placement of feeding tube with tip in the body of the stomach. Tip pointed towards the pylorus. No dilated large or small bowel.  IVC filter in GDA coils noted. IMPRESSION: 1. Feeding tube with tip  in the gastric body pointed towards the pylorus. 2. No bowel obstruction Electronically Signed   By: Suzy Bouchard M.D.   On: 03/25/2020 09:09        Scheduled Meds: . chlorhexidine gluconate (MEDLINE KIT)  15 mL Mouth Rinse BID  . Chlorhexidine Gluconate Cloth  6 each Topical Q0600  . feeding supplement (PRO-STAT SUGAR FREE 64)  30 mL Per Tube Daily  . free water  200 mL Per Tube Q6H  . mouth rinse  15 mL Mouth Rinse 10 times per day  . methylPREDNISolone (SOLU-MEDROL) injection  40 mg Intravenous Q6H  . pantoprazole  40 mg Intravenous Q12H  . scopolamine  1 patch Transdermal Q72H  . sodium chloride flush  10-40 mL Intracatheter Q12H  . sodium chloride flush  10-40 mL Intracatheter Q12H   Continuous Infusions: . sodium chloride Stopped (03/13/20 1943)  . sodium chloride    . dextrose    . doxycycline (VIBRAMYCIN) IV    . feeding supplement (JEVITY 1.2 CAL) 75 mL/hr at 03/25/20 1230     LOS: 19 days        Hosie Poisson, MD [facility]   To contact the attending provider between 7A-7P or the covering provider during after hours 7P-7A, please log into the web site [email or web address] and access using universal [place] password for that web site. If you do not have the password, please call the  hospital operator.  03/26/2020, 10:03 AM

## 2020-03-27 DIAGNOSIS — R1310 Dysphagia, unspecified: Secondary | ICD-10-CM | POA: Diagnosis not present

## 2020-03-27 DIAGNOSIS — Z930 Tracheostomy status: Secondary | ICD-10-CM | POA: Diagnosis not present

## 2020-03-27 DIAGNOSIS — I63429 Cerebral infarction due to embolism of unspecified anterior cerebral artery: Secondary | ICD-10-CM | POA: Diagnosis not present

## 2020-03-27 DIAGNOSIS — J9601 Acute respiratory failure with hypoxia: Secondary | ICD-10-CM | POA: Diagnosis not present

## 2020-03-27 LAB — BASIC METABOLIC PANEL
Anion gap: 16 — ABNORMAL HIGH (ref 5–15)
BUN: 7 mg/dL — ABNORMAL LOW (ref 8–23)
CO2: 20 mmol/L — ABNORMAL LOW (ref 22–32)
Calcium: 9.3 mg/dL (ref 8.9–10.3)
Chloride: 104 mmol/L (ref 98–111)
Creatinine, Ser: 0.69 mg/dL (ref 0.61–1.24)
GFR calc Af Amer: >60 mL/min (ref >60)
GFR calc non Af Amer: >60 mL/min (ref >60)
Glucose, Bld: 112 mg/dL — ABNORMAL HIGH (ref 70–99)
Potassium: 5 mmol/L (ref 3.5–5.1)
Sodium: 140 mmol/L (ref 135–145)

## 2020-03-27 LAB — CBC WITH DIFFERENTIAL/PLATELET
Abs Immature Granulocytes: 0.1 10*3/uL — ABNORMAL HIGH (ref 0.00–0.07)
Basophils Absolute: 0 10*3/uL (ref 0.0–0.1)
Basophils Relative: 0 %
Eosinophils Absolute: 0 10*3/uL (ref 0.0–0.5)
Eosinophils Relative: 0 %
HCT: 30.4 % — ABNORMAL LOW (ref 39.0–52.0)
Hemoglobin: 9.9 g/dL — ABNORMAL LOW (ref 13.0–17.0)
Immature Granulocytes: 1 %
Lymphocytes Relative: 9 %
Lymphs Abs: 1.2 10*3/uL (ref 0.7–4.0)
MCH: 31 pg (ref 26.0–34.0)
MCHC: 32.6 g/dL (ref 30.0–36.0)
MCV: 95.3 fL (ref 80.0–100.0)
Monocytes Absolute: 1 10*3/uL (ref 0.1–1.0)
Monocytes Relative: 8 %
Neutro Abs: 11.4 10*3/uL — ABNORMAL HIGH (ref 1.7–7.7)
Neutrophils Relative %: 82 %
Platelets: 436 10*3/uL — ABNORMAL HIGH (ref 150–400)
RBC: 3.19 MIL/uL — ABNORMAL LOW (ref 4.22–5.81)
RDW: 16.4 % — ABNORMAL HIGH (ref 11.5–15.5)
WBC: 13.8 10*3/uL — ABNORMAL HIGH (ref 4.0–10.5)
nRBC: 0 % (ref 0.0–0.2)

## 2020-03-27 LAB — PHOSPHORUS: Phosphorus: 3.3 mg/dL (ref 2.5–4.6)

## 2020-03-27 LAB — GLUCOSE, CAPILLARY
Glucose-Capillary: 96 mg/dL (ref 70–99)
Glucose-Capillary: 108 mg/dL — ABNORMAL HIGH (ref 70–99)
Glucose-Capillary: 115 mg/dL — ABNORMAL HIGH (ref 70–99)
Glucose-Capillary: 87 mg/dL (ref 70–99)
Glucose-Capillary: 91 mg/dL (ref 70–99)
Glucose-Capillary: 75 mg/dL (ref 70–99)

## 2020-03-27 MED ORDER — METHYLPREDNISOLONE SODIUM SUCC 40 MG IJ SOLR
40.0000 mg | Freq: Two times a day (BID) | INTRAMUSCULAR | Status: DC
  Administered 2020-03-27 – 2020-04-04 (×16): 40 mg via INTRAVENOUS
  Filled 2020-03-27 (×15): qty 1

## 2020-03-27 NOTE — Progress Notes (Signed)
Patient ID: Kenneth Sullivan, male   DOB: 06/15/1956, 64 y.o.   MRN: [id number]   Pt is still scheduled for perc G tube in IR asap  We are experiencing a national shortage of G tubes  We will place orders and  plan to move forward asap when shipment arrives  So sorry for this inconvenience

## 2020-03-27 NOTE — Progress Notes (Signed)
NAME:  Kenneth Sullivan, MRN:  [id number], DOB:  08/30/1956, LOS: 55 ADMISSION DATE:  03/07/2020, CONSULTATION DATE:  03/07/20 REFERRING MD:  Dr Roxanne Mins MD, CHIEF COMPLAINT:  Cardiac arrest  Brief History   64 year old with no significant past medical history.  Had acute shortness of breath with sats in the 40s on EMS arrival.  He had a witnessed bradycardic, asystolic arrest when EMS was present with 3 rounds of epi, 20 minutes to ROSC. Underwent normothermia protocol.  Work-up significant for massive PE, DVT with RV strain, GI bleed from gastric ulcer.  Past Medical History  Has not seen a doctor.  No past medical history. Smokes 1 pack/day, drinks 2 to 3 40 ounce beers every day.  Smokes marijuana.  Denies any other recreational drug use.  Significant Hospital Events   3/23 Admit, EGD with findings of oozing gastric ulcer with clot, IR for mechanical thrombectomy, IVC filter and GDA embolization 3/24 Started heparin drip  Consults:  Cardiology, GI, interventional radiology, neurolgoy  Procedures:  ETT 3/23 >>4/5 Trach 4/5 shiley 6 >> L TLC 3/23 >>  ALine L Fem 3/23 >> 3/30  Significant Diagnostic Tests:  CTA 3/23 >> bilateral PE with RV/with ratio 1.9, emphysema CT head 3/23 >> chronic microvascular changes.  No acute findings Echo 3/23 >> RV is severely dilated with reduced function and D-shaped septum, LVEF 60-65%. LE Venous Duplex 3/24 >> acute DVT in BLE up to the femoral vein  MRI Brain 3/28 >> multifocal acute to subacute ischemic infarcts involving bilateral cerebral hemispheres, findings consistent with global hypoperfusion event related to cardiac arrest, associated scattered petechial hemorrhage about many areas of ischemia, evidence of hemorrhagic conversion in the right parietal lobe  Micro Data:  Bcx 3/23 >> negative [name] 3/23 >> negative BCx2 3/23 >> negative  Trach asp 4/10 >>  BCx 2 4/10 >>  Antimicrobials:  4/10 doxycycline >>  Interim history/subjective:    Pt remains on ATC 5L/28% O2  Afebrile   Objective   Blood pressure (!) 144/86, pulse 92, temperature 98.9 F (37.2 C), temperature source Axillary, resp. rate (!) 26, height 6\' 2"  (1.88 m), weight 70.3 kg, SpO2 94 %.    FiO2 (%):  [28 %] 28 %   Intake/Output Summary (Last 24 hours) at 03/27/2020 0825 Last data filed at 03/27/2020 0557 Gross per 24 hour  Intake 746.67 ml  Output 650 ml  Net 96.67 ml   Filed Weights   03/23/20 0342 03/25/20 0500 03/27/20 0500  Weight: 77.8 kg 77.7 kg 70.3 kg    Examination:  General:  Chronically ill adult male lying in bed, restless HEENT: MM pink/moist, midline cuffed 6 shiley trach (cuff down) with mod amount of thick/ tan secretions, cortrak  Neuro: awake, will mouth some words and intermittent follows commands, MAE CV: rr, no murmur PULM:  Non labored, coarse throughout GI: soft, bs active, condom cath  Extremities: warm/dry, no LE edema  Skin: no rashes  Resolved Hospital Problem list   Hypoglycemia Transaminitis  Assessment & Plan:   Tracheobronchitis - continue doxycycline, started 4/10 - follow trach asp cx  - continue steroids, decrease to 40mg  BID  - continue Duonebs prn q 6hr - NTS PRN  - follow intermittent CXR/ trend fever/ WBC curve    Cardiac Arrest secondary to Massive PE Asystole as initial rhythm secondary to massive PE, downtime ~ 20 minutes.  Initial CT head w/o acute abnormality. S/P TTM.  Follow up MRI with strokes and hemorrhagic conversion.  IVC  filter in place, no plans for reinitiation of heparin due to hemorrhagic conversion of previous CVAs. - continue supportive care    Anoxic Encephalopathy CVA's with Hemorrhagic Conversion  MRI with multiple acute and subacute infarcts with evidence of hemorrhagic conversion. Affecting language and speech center of brain. Repeat neurological consult on 03/16/2020 >> poor prognosis for any meaningful recovery. - patient's family have expressed desire for no LTAC   -  continue rehab efforts   Acute Respiratory Failure secondary to Massive PE with Acute Cor Pulmonale Status post mechanical thrombectomy and IVC filter placement - continue trach care per protocol, can likely change to cuffless trach soon, sutures are out - SLP following  - wean O2 for sats >90% - pulmonary hygiene, ongoing    Acute Upper GI bleed secondary to large Gastric Ulcer Status post GDA embolization -defer feeding decision to GI, primary    Joes From records documented: - No escalation to ventilator - No PEG tube placement - Family does not want him placed in [place], MSN, AGACNP-BC [facility] 03/27/2020, 8:25 AM  Please see [email or web address] for pager details.

## 2020-03-27 NOTE — Progress Notes (Signed)
PROGRESS NOTE    Kenneth Sullivan  BJS:[id number] DOB: October 24, 1956 DOA: 03/07/2020 PCP: Medicine, [name]  Brief Narrative:  64 year old gentleman with no significant past medical history presents with shortness of breath and hypoxic had a witnessed bradycardic asystolic arrest underwent normothermia protocol was found to have significant massive PE, DVT with RV strain and acute GI bleed from gastric ulcer.  Patient was admitted on March 07 2020.  When EGD showing oozing gastric ulcer with clot.  He underwent IR guided mechanical thrombectomy, IVC filter placement and GDA embolization.  His hospital course was complicated by multifocal acute to subacute ischemic infarcts involving bilateral cerebral hemispheres consistent with global hypoperfusion related to cardiac arrest associated with scattered petechial hemorrhages with evidence of hemorrhagic conversion in the right parietal lobe. Patient seen and examined today he appears a little calmer when compared to yesterday.  Core track in place and tube feeds have been started.  As patient continued to have increased secretions orally and he is unable to swallow he has continuous drooling and in addition he has copious yellow-colored secretions from the trach.  Chest x-ray remains clear and minimal elevation in WBC count, afebrile.  Obtain tracheal aspirate send for cultures, started the patient on doxycycline for possible tracheobronchitis and Robinul for increased secretions. CCM consulted and added IV steroids for tracheobronchitis. Pt seen and examined today, no new complaints.    Assessment & Plan:   Principal Problem:   Cardiac arrest [facility]) Active Problems:   Encounter for central line placement   GI bleed   Acute respiratory failure (HCC)   Anoxic encephalopathy (HCC)   Cerebral embolism with cerebral infarction   Pulmonary emboli ([place])   Palliative care by specialist   Goals of care, counseling/discussion   DNR (do not  resuscitate)   Cardiac arrest secondary to massive pulmonary embolism S/p  3 rounds of epi, 20 min to ROSC, underwent normothermic protocol.  MRI of the brain shows multiple acute to subacute ischemic infarcts involving bilateral cerebral hemispheres consistent with global hypoperfusion related to cardiac arrest.  S/p IVC filter placement Continue with supportive management   Anoxic encephalopathy CVAs with hemorrhagic conversion MRI showing multiple acute and subacute infarcts with evidence of hemorrhagic conversion Poor prognosis for any meaningful recovery. Palliative care consulted and after goals of care discussions alone, family would like to pursue aggressive management including PEG placement and LTAC placement Family ( his niece) would like to pursue  feeding tubes and  LTAC placement. IR consulted for PEG placement.  To be scheduled next week.    Acute respiratory failure with hypoxia secondary to massive PE with cor pulmonale S/p intubation on admission and s/p trach placement on 03/20/2020.  S/p mechanical thrombectomy by IR and IVC filter placement for the acute DVT, unable to anti coagulate due to hemorrhagic conversion of the multiple infarcts.  Currently on trach collar and he is able to tolerate but continues to have copious, thick secretions.  Scopolamine patch to help with secretions Doxycycline added for tracheobronchitis and tracheal aspirate sent for cultures. IV steroids started by [facility] for tracheobronchitis. Tracheal aspirate shows rare WBC, few gram-positive cocci in pairs and few gram-negative rods in the aspirate incubated for better growth.  Repeat blood cultures ordered and are pending Chest x-ray does not show any infiltrates. Continue with pulmonary hygiene. PCCM recommends to size #4 cuffless trach once stoma is more mature, wait until trach has been in for at least 14 days.     Tracheobronchitis Tracheal  aspirate sent for cultures  patient currently  on IV doxycycline IV steroids  Dysphagia As per SLP evaluation patient demonstrating ability to swallow oral without any immediate signs of aspiration with sips of water despite cognitive impairments.  Plan for further testing in the next 24 hours. Patient currently has a core track which is unfortunately leaking and tube feeds are on hold. Speech evaluation recommending  Passy muir Speech valve onl with SLP with full supervision.       Acute GI bleed secondary to large gastric ulcer S/p GDA embolization. Resume PPI IV twice daily   Nutrition: malnourished.  IR consulted for PEG placement to be scheduled next week. Core track placed and tubes feeds started but unfortunately started leaking and the tube feeds were held.  Persistent hypokalemia Replaced and repeat potassium around 5    Acute anemia of blood loss  Hemoglobin stable around 9  transfuse to keep hemoglobin greater than 7. Repeat CBC in the morning    Agitation Patient is frustrated about not being able to swallow. As needed Haldol/Ativan ordered.   Patient was found on the floor and his trach was dislodged 03/26/2020. PCCM replaced and he was given a dose of Versed.  CT of the head ordered showed, Expected evolution of the hemorrhagic right parietal lobe infarct since 03/14/2020. Satisfactory evolution of the other much smaller infarcts with petechial hemorrhage since last month. No new intracranial abnormality or acute traumatic injury Identified. Right shoulder and pelvis x-rays are negative for fractures. Sitter requested .    DVT prophylaxis: SCDs Code Status: DNR Family Communication: None at bedside ,discussed the plan with niece over the phone.  Disposition Plan:  . Patient came from: Home           Anticipated d/c place: Pending therapy evaluations.  Barriers to d/c OR conditions which need to be met to effect a safe d/c: PEG placement pending and TOC on board.   Consultants:    Gastroenterology  Cardiology  IR  Neurology  Procedures:  CTA 3/23 >> bilateral PE with RV/with ratio 1.9, emphysema CT head 3/23 >> chronic microvascular changes.  No acute findings Echo 3/23 >> RV is severely dilated with reduced function and D-shaped septum, LVEF 60-65%. LE Venous Duplex 3/24 >> acute DVT in BLE up to the femoral vein  MRI Brain 3/28 >> multifocal acute to subacute ischemic infarcts involving bilateral cerebral hemispheres, findings consistent with global hypoperfusion event related to cardiac arrest, associated scattered petechial hemorrhage about many areas of ischemia, evidence of hemorrhagic conversion in the right parietal lobe. ETT 3/23 >>4/5 Trach 4/5>> L TLC 3/23 >>  ALine L Fem 3/23 >> 3/30    Antimicrobials:  Anti-infectives (From admission, onward)   Start     Dose/Rate Route Frequency Ordered Stop   03/26/20 1100  doxycycline (VIBRAMYCIN) 100 mg in sodium chloride 0.9 % 250 mL IVPB     100 mg 125 mL/hr over 120 Minutes Intravenous 2 times daily 03/26/20 1002     03/25/20 1100  doxycycline (VIBRA-TABS) tablet 100 mg  Status:  Discontinued     100 mg Per Tube Every 12 hours 03/25/20 1009 03/26/20 1002   03/25/20 1015  doxycycline (VIBRAMYCIN) 100 mg in sodium chloride 0.9 % 250 mL IVPB  Status:  Discontinued    Note to Pharmacy: To be given after the tracheal aspirate is collected.   100 mg 125 mL/hr over 120 Minutes Intravenous Every 12 hours 03/25/20 1005 03/25/20 1008  Subjective: He appears move calm today. No sitter at bedside.   Objective: Vitals:   03/27/20 0719 03/27/20 0820 03/27/20 1104 03/27/20 1155  BP: (!) 144/86  (!) 143/87 (!) 143/87  Pulse: 92 98 (!) 101 (!) 127  Resp: (!) 26 (!) 32 (!) 27 (!) 24  Temp: 98.9 F (37.2 C)  98.5 F (36.9 C) 98.4 F (36.9 C)  TempSrc: Axillary  Oral   SpO2: 94% 95% 95% 95%  Weight:      Height:        Intake/Output Summary (Last 24 hours) at 03/27/2020 1459 Last data  filed at 03/27/2020 0557 Gross per 24 hour  Intake 746.67 ml  Output 650 ml  Net 96.67 ml   Filed Weights   03/23/20 0342 03/25/20 0500 03/27/20 0500  Weight: 77.8 kg 77.7 kg 70.3 kg    Examination:  General exam: Ill-appearing cachectic looking gentleman s/p trach on 7 L of high flow oxygen Respiratory system: Coarse breath sounds bilateral, air entry fair, tachypnea present Cardiovascular system: S1-S2 heard, tachycardic, regular, no JVD, leg edema present Gastrointestinal system: Abdomen is soft nontender bowel sounds normal Central nervous system: Patient alert and following commands, mouthing words Extremities: Trace pedal edema Skin: Excoriations on the back Psychiatry: Cannot be assessed   Data Reviewed: I have personally reviewed following labs and imaging studies  CBC: Recent Labs  Lab 03/23/20 0945 03/24/20 1000  WBC 14.3* 11.8*  HGB 11.0* 9.4*  HCT 33.5* 28.7*  MCV 96.3 94.1  PLT 470* 016*   Basic Metabolic Panel: Recent Labs  Lab 03/22/20 0309 03/22/20 0309 03/23/20 0945 03/24/20 1000 03/24/20 2054 03/25/20 1114 03/25/20 1639 03/26/20 1745 03/27/20 0759  NA 136  --  136 135  --  137  --   --  140  K 2.9*  --  3.1* 3.0*  --  3.2*  --   --  5.0  CL 98  --  100 98  --  100  --   --  104  CO2 26  --  24 24  --  26  --   --  20*  GLUCOSE 96  --  89 83  --  93  --   --  112*  BUN 6*  --  <5* <5*  --  <5*  --   --  7*  CREATININE 0.77  --  0.70 0.77  --  0.74  --   --  0.69  CALCIUM 8.1*  --  8.5* 8.3*  --  8.4*  --   --  9.3  MG 1.7  --   --  1.7 1.7 1.8 2.0  --   --   PHOS 2.7   < >  --   --  2.6 2.9 2.7 2.2* 3.3   < > = values in this interval not displayed.   GFR: Estimated Creatinine Clearance: 95.2 mL/min (by C-G formula based on SCr of 0.69 mg/dL). Liver Function Tests: No results for input(s): AST, ALT, ALKPHOS, BILITOT, PROT, ALBUMIN in the last 168 hours. No results for input(s): LIPASE, AMYLASE in the last 168 hours. No results for  input(s): AMMONIA in the last 168 hours. Coagulation Profile: No results for input(s): INR, PROTIME in the last 168 hours. Cardiac Enzymes: No results for input(s): CKTOTAL, CKMB, CKMBINDEX, TROPONINI in the last 168 hours. BNP (last 3 results) No results for input(s): PROBNP in the last 8760 hours. HbA1C: No results for input(s): HGBA1C in the last 72 hours. CBG: Recent  Labs  Lab 03/26/20 1945 03/26/20 2314 03/27/20 0324 03/27/20 0739 03/27/20 1104  GLUCAP 112* 111* 96 108* 115*   Lipid Profile: No results for input(s): CHOL, HDL, LDLCALC, TRIG, CHOLHDL, LDLDIRECT in the last 72 hours. Thyroid Function Tests: No results for input(s): TSH, T4TOTAL, FREET4, T3FREE, THYROIDAB in the last 72 hours. Anemia Panel: No results for input(s): VITAMINB12, FOLATE, FERRITIN, TIBC, IRON, RETICCTPCT in the last 72 hours. Sepsis Labs: No results for input(s): PROCALCITON, LATICACIDVEN in the last 168 hours.  Recent Results (from the past 240 hour(s))  Culture, respiratory (non-expectorated)     Status: None (Preliminary result)   Collection Time: 03/25/20 12:02 PM   Specimen: Tracheal Aspirate; Respiratory  Result Value Ref Range Status   Specimen Description TRACHEAL ASPIRATE  Final   Special Requests NONE  Final   Gram Stain   Final    RARE WBC PRESENT, PREDOMINANTLY MONONUCLEAR FEW GRAM POSITIVE COCCI IN PAIRS FEW GRAM NEGATIVE RODS    Culture   Final    CULTURE REINCUBATED FOR BETTER GROWTH Performed at [facility], [place] [address] [place], [place] [address]    Report Status PENDING  Incomplete  Culture, blood (Routine X 2) w Reflex to ID Panel     Status: None (Preliminary result)   Collection Time: 03/25/20  4:43 PM   Specimen: BLOOD  Result Value Ref Range Status   Specimen Description BLOOD BLOOD LEFT WRIST  Final   Special Requests   Final    BOTTLES DRAWN AEROBIC ONLY Blood Culture adequate volume   Culture   Final    NO GROWTH 2 DAYS Performed at [facility], [place] [address] [place], [place] [address]    Report Status PENDING  Incomplete         Radiology Studies: CT HEAD WO CONTRAST  Result Date: 03/26/2020 CLINICAL DATA:  64 year old male status post numerous bilateral cerebral and cerebellar infarcts late last month, 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 Fall from bed. EXAM: CT HEAD WITHOUT CONTRAST TECHNIQUE: Contiguous axial images were obtained from the base of the skull through the vertex without intravenous contrast. COMPARISON:  Head CT 03/14/2020 and earlier. FINDINGS: Brain: Expected evolution of the right parietal lobe hemorrhagic infarct with fading hyperdense blood products, persistent edema, but decreasing regional mass effect (series 3, image 22). There is also decreasing petechial hemorrhage in the left occipital pole (image 17). Minimal residual blood products in the posterior left temporal lobe (image 16). No extra-axial or intraventricular extension of blood. No ventriculomegaly. No midline shift. Basilar cisterns remain patent. The mostly small numerous additional areas of bilateral ischemia last month are subtle by CT and have faded. No new or increasing cytotoxic edema. No new cortically based infarct or new intracranial hemorrhage identified. Vascular: No suspicious intracranial vascular hyperdensity. Skull: Stable and intact. Chronic left TMJ degeneration. Sinuses/Orbits: Visualized paranasal sinuses and mastoids are clear. Other: Right nasoenteric tube is visible. Rightward gaze deviation, but no other acute orbit or scalp soft tissue finding. IMPRESSION: 1. Expected evolution of the hemorrhagic right parietal lobe infarct since 03/14/2020. Satisfactory evolution of the other much smaller infarcts with petechial hemorrhage since last month. The majority of the highly numerous other small infarcts seen in both cerebral and cerebellar hemispheres are occult by CT. 2. No new intracranial  abnormality or acute traumatic injury identified. Electronically Signed   By: Genevie Ann M.D.   On: 03/26/2020 19:36   DG Pelvis Portable  Result Date: 03/26/2020 CLINICAL DATA:  Fall. EXAM: PORTABLE PELVIS 1-2 VIEWS COMPARISON:  None FINDINGS: There is no evidence of pelvic fracture or diastasis. No pelvic bone lesions are seen. IMPRESSION: Negative evaluation of the bony pelvis. Electronically Signed   By: Zetta Bills M.D.   On: 03/26/2020 15:28   DG CHEST PORT 1 VIEW  Result Date: 03/25/2020 CLINICAL DATA:  Cardiac arrest EXAM: PORTABLE CHEST 1 VIEW COMPARISON:  03/24/2020 FINDINGS: Tracheostomy tube in situ terminating in proximal trachea. Feeding tube in the mid stomach. Cardiomediastinal contours hilar structures are unremarkable. Lungs are clear. No acute bone finding. IMPRESSION: 1. No acute cardiopulmonary disease. 2. Tracheostomy tube in situ terminating in proximal trachea. Electronically Signed   By: Zetta Bills M.D.   On: 03/25/2020 17:47   DG Shoulder Right Port  Result Date: 03/26/2020 CLINICAL DATA:  Fall on the right side, shoulder pain. EXAM: PORTABLE RIGHT SHOULDER COMPARISON:  None FINDINGS: Two views of the right shoulder show no signs of acute fracture a or dislocation. Mild acromioclavicular degenerative changes. IMPRESSION: No evidence of acute fracture or dislocation. Mild acromioclavicular degenerative changes. Electronically Signed   By: Zetta Bills M.D.   On: 03/26/2020 15:27        Scheduled Meds: . chlorhexidine gluconate (MEDLINE KIT)  15 mL Mouth Rinse BID  . Chlorhexidine Gluconate Cloth  6 each Topical Q0600  . feeding supplement (PRO-STAT SUGAR FREE 64)  30 mL Per Tube Daily  . free water  200 mL Per Tube Q6H  . mouth rinse  15 mL Mouth Rinse 10 times per day  . methylPREDNISolone (SOLU-MEDROL) injection  40 mg Intravenous Q12H  . pantoprazole  40 mg Intravenous Q12H  . QUEtiapine  12.5 mg Per Tube QHS  . scopolamine  1 patch Transdermal Q72H  .  sodium chloride flush  10-40 mL Intracatheter Q12H  . sodium chloride flush  10-40 mL Intracatheter Q12H   Continuous Infusions: . sodium chloride    . dextrose 50 mL/hr at 03/26/20 1934  . doxycycline (VIBRAMYCIN) IV 100 mg (03/27/20 1149)  . feeding supplement (JEVITY 1.2 CAL) 75 mL/hr at 03/25/20 1230     LOS: 20 days        Hosie Poisson, MD [facility]   To contact the attending provider between 7A-7P or the covering provider during after hours 7P-7A, please log into the web site [email or web address] and access using universal [place] password for that web site. If you do not have the password, please call the hospital operator.  03/27/2020, 2:59 PM

## 2020-03-27 NOTE — Progress Notes (Signed)
  Speech Language Pathology Treatment: Kenneth Sullivan Speaking valve  Patient Details Name: Kenneth Sullivan MRN: [id number] DOB: 08/06/1956 Today's Date: 03/27/2020 Time: [id number] SLP Time Calculation (min) (ACUTE ONLY): 25 min  Assessment / Plan / Recommendation Clinical Impression  Pt restless in bed, but mouthing words, attempting to communicate. Allowed me to check cuff and place valve. Though RR remained moderate (25) BPM with some slight wheeze, no back pressure noted with PMSV removal. Pt was able to maintain PMSV in place without negative consequence with increasing volume and clearer phonation near the end of session. Critical care MD present and agreeable to cuffless 4 trach which is likely to improve airflow. Will f/u. See next note regarding favorable swallow evaluation.   HPI HPI: 64 year old admitted 3/23 with shortness of breath with sats in the 40s on with witnessed bradycardic, asystolic arrest when EMS was present with 3 rounds of epi, 20 minutes to ROSC per chart. Found to also have bilateral PR's and had GI bleed following arrest. MRI showed multifocal acute-subactue ischemic infarcts c/w global hypoperfusion. Intubated 3/23 adn trach placed 4/2. CXR 4/3 no active disease. No evidence of pneumonia or pulmonary edema. Per chart pt smokes 1 pack/day, consumes 2-3 forty oz beers daily, + marijuana.      SLP Plan  Continue with current plan of care       Recommendations         Patient may use Passy-Muir Speech Valve: During all therapies with supervision;Intermittently with supervision PMSV Supervision: Full MD: Please consider changing trach tube to : Cuffless         Oral Care Recommendations: Oral care QID Follow up Recommendations: Other (comment) Plan: Continue with current plan of care       GO                Kenneth Sullivan, Katherene Ponto 03/27/2020, 12:52 PM

## 2020-03-27 NOTE — Evaluation (Signed)
Clinical/Bedside Swallow Evaluation Patient Details  Name: Kenneth Sullivan MRN: [id number] Date of Birth: May 31, 1956  Today's Date: 03/27/2020 Time: SLP Start Time (ACUTE ONLY): 1100 SLP Stop Time (ACUTE ONLY): 1125 SLP Time Calculation (min) (ACUTE ONLY): 25 min  Past Medical History:  Past Medical History:  Diagnosis Date  . Cardiac arrest ([place]) 03/07/2020  . Known health problems: none    No recent medical care, has not had a physical in years   Past Surgical History:  Past Surgical History:  Procedure Laterality Date  . ESOPHAGOGASTRODUODENOSCOPY (EGD) WITH PROPOFOL N/A 03/07/2020   Procedure: ESOPHAGOGASTRODUODENOSCOPY (EGD) WITH PROPOFOL;  Surgeon: Wilford Corner, MD;  Location: [facility];  Service: Endoscopy;  Laterality: N/A;  . IR ANGIOGRAM PULMONARY BILATERAL SELECTIVE  03/07/2020  . IR ANGIOGRAM SELECTIVE EACH ADDITIONAL VESSEL  03/07/2020  . IR ANGIOGRAM SELECTIVE EACH ADDITIONAL VESSEL  03/07/2020  . IR ANGIOGRAM SELECTIVE EACH ADDITIONAL VESSEL  03/07/2020  . IR ANGIOGRAM VISCERAL SELECTIVE  03/07/2020  . IR EMBO ART  VEN HEMORR LYMPH EXTRAV  INC GUIDE ROADMAPPING  03/07/2020  . IR IVC FILTER PLMT / S&I /IMG GUID/MOD SED  03/07/2020  . IR THROMBECT PRIM MECH INIT (INCLU) MOD SED  03/07/2020  . IR US GUIDE VASC ACCESS RIGHT  03/07/2020  . IR US GUIDE VASC ACCESS RIGHT  03/07/2020  . IR US GUIDE VASC ACCESS RIGHT  03/07/2020   HPI:  64 year old admitted 3/23 with shortness of breath with sats in the 40s on with witnessed bradycardic, asystolic arrest when EMS was present with 3 rounds of epi, 20 minutes to ROSC per chart. Found to also have bilateral PR's and had GI bleed following arrest. MRI showed multifocal acute-subactue ischemic infarcts c/w global hypoperfusion. Intubated 3/23 adn trach placed 4/2. CXR 4/3 no active disease. No evidence of pneumonia or pulmonary edema. Per chart pt smokes 1 pack/day, consumes 2-3 forty oz beers daily, + marijuana.   Assessment / Plan /  Recommendation Clinical Impression  Pt demonstrates promising ability to swallow PO. No immediate signs of aspriation seen with sips of water despite poor positioning and cognitive impairment. Recommend instrumental testing tomorrow to determine ability to consume PO and defer need for Gtube.  SLP Visit Diagnosis: Dysphagia, unspecified (R13.10)    Aspiration Risk  Risk for inadequate nutrition/hydration    Diet Recommendation NPO        Other  Recommendations Oral Care Recommendations: Oral care QID   Follow up Recommendations Other (comment)      Frequency and Duration            Prognosis        Swallow Study   General HPI: 64 year old admitted 3/23 with shortness of breath with sats in the 40s on with witnessed bradycardic, asystolic arrest when EMS was present with 3 rounds of epi, 20 minutes to ROSC per chart. Found to also have bilateral PR's and had GI bleed following arrest. MRI showed multifocal acute-subactue ischemic infarcts c/w global hypoperfusion. Intubated 3/23 adn trach placed 4/2. CXR 4/3 no active disease. No evidence of pneumonia or pulmonary edema. Per chart pt smokes 1 pack/day, consumes 2-3 forty oz beers daily, + marijuana. Type of Study: Bedside Swallow Evaluation Diet Prior to this Study: NPO Temperature Spikes Noted: No Respiratory Status: Trach Collar History of Recent Intubation: Yes Length of Intubations (days): 12 days Date extubated: 03/17/20 Behavior/Cognition: Alert;Confused;Distractible;Requires cueing Oral Cavity Assessment: Within Functional Limits Oral Care Completed by SLP: No Oral Cavity - Dentition: Poor condition;Missing dentition Self-Feeding Abilities:  Needs assist Patient Positioning: Postural control interferes with function Baseline Vocal Quality: Hoarse;Breathy Volitional Cough: Cognitively unable to elicit Volitional Swallow: Unable to elicit    Oral/Motor/Sensory Function Overall Oral Motor/Sensory Function: Within  functional limits   Ice Chips Ice chips: Impaired Presentation: Spoon Oral Phase Functional Implications: (spit out)   Thin Liquid Thin Liquid: Within functional limits Presentation: Straw    Nectar Thick Nectar Thick Liquid: Not tested   Honey Thick Honey Thick Liquid: Not tested   Puree Puree: Not tested Other Comments: refused   Solid     Solid: Not tested      Kenneth Sullivan, Katherene Ponto 03/27/2020,1:00 PM

## 2020-03-27 NOTE — Plan of Care (Signed)
  Problem: Clinical Measurements: Goal: Will remain free from infection Outcome: Progressing Goal: Respiratory complications will improve Outcome: Progressing Goal: Cardiovascular complication will be avoided Outcome: Progressing   Problem: Coping: Goal: Level of anxiety will decrease Outcome: Progressing   Problem: Safety: Goal: Ability to remain free from injury will improve Outcome: Progressing   Problem: Skin Integrity: Goal: Risk for impaired skin integrity will decrease Outcome: Progressing   Problem: Health Behavior/Discharge Planning: Goal: Ability to manage health-related needs will improve Outcome: Not Progressing   Problem: Nutrition: Goal: Adequate nutrition will be maintained Outcome: Not Progressing

## 2020-03-28 ENCOUNTER — Inpatient Hospital Stay (HOSPITAL_COMMUNITY): Payer: Medicaid Other

## 2020-03-28 DIAGNOSIS — J9601 Acute respiratory failure with hypoxia: Secondary | ICD-10-CM | POA: Diagnosis not present

## 2020-03-28 DIAGNOSIS — I63429 Cerebral infarction due to embolism of unspecified anterior cerebral artery: Secondary | ICD-10-CM | POA: Diagnosis not present

## 2020-03-28 DIAGNOSIS — R1310 Dysphagia, unspecified: Secondary | ICD-10-CM | POA: Diagnosis not present

## 2020-03-28 LAB — CULTURE, RESPIRATORY W GRAM STAIN: Culture: NORMAL

## 2020-03-28 LAB — GLUCOSE, CAPILLARY
Glucose-Capillary: 101 mg/dL — ABNORMAL HIGH (ref 70–99)
Glucose-Capillary: 104 mg/dL — ABNORMAL HIGH (ref 70–99)
Glucose-Capillary: 69 mg/dL — ABNORMAL LOW (ref 70–99)
Glucose-Capillary: 83 mg/dL (ref 70–99)
Glucose-Capillary: 85 mg/dL (ref 70–99)
Glucose-Capillary: 87 mg/dL (ref 70–99)
Glucose-Capillary: 99 mg/dL (ref 70–99)

## 2020-03-28 LAB — COMPREHENSIVE METABOLIC PANEL
ALT: 12 U/L (ref 0–44)
AST: 58 U/L — ABNORMAL HIGH (ref 15–41)
Albumin: 2.8 g/dL — ABNORMAL LOW (ref 3.5–5.0)
Alkaline Phosphatase: 73 U/L (ref 38–126)
Anion gap: 12 (ref 5–15)
BUN: 9 mg/dL (ref 8–23)
CO2: 21 mmol/L — ABNORMAL LOW (ref 22–32)
Calcium: 8.5 mg/dL — ABNORMAL LOW (ref 8.9–10.3)
Chloride: 103 mmol/L (ref 98–111)
Creatinine, Ser: 0.76 mg/dL (ref 0.61–1.24)
GFR calc Af Amer: >60 mL/min (ref >60)
GFR calc non Af Amer: >60 mL/min (ref >60)
Glucose, Bld: 83 mg/dL (ref 70–99)
Potassium: 5.1 mmol/L (ref 3.5–5.1)
Sodium: 136 mmol/L (ref 135–145)
Total Bilirubin: 1.6 mg/dL — ABNORMAL HIGH (ref 0.3–1.2)
Total Protein: 6.1 g/dL — ABNORMAL LOW (ref 6.5–8.1)

## 2020-03-28 LAB — CBC
HCT: 31.9 % — ABNORMAL LOW (ref 39.0–52.0)
Hemoglobin: 10.2 g/dL — ABNORMAL LOW (ref 13.0–17.0)
MCH: 31 pg (ref 26.0–34.0)
MCHC: 32 g/dL (ref 30.0–36.0)
MCV: 97 fL (ref 80.0–100.0)
Platelets: 391 10*3/uL (ref 150–400)
RBC: 3.29 MIL/uL — ABNORMAL LOW (ref 4.22–5.81)
RDW: 16.8 % — ABNORMAL HIGH (ref 11.5–15.5)
WBC: 13.2 10*3/uL — ABNORMAL HIGH (ref 4.0–10.5)
nRBC: 0 % (ref 0.0–0.2)

## 2020-03-28 LAB — PHOSPHORUS
Phosphorus: 3.8 mg/dL (ref 2.5–4.6)
Phosphorus: 3.6 mg/dL (ref 2.5–4.6)

## 2020-03-28 MED ORDER — DEXTROSE 50 % IV SOLN
25.0000 mL | Freq: Once | INTRAVENOUS | Status: AC
  Administered 2020-03-28: 25 mL via INTRAVENOUS

## 2020-03-28 MED ORDER — METOPROLOL TARTRATE 5 MG/5ML IV SOLN
2.5000 mg | Freq: Two times a day (BID) | INTRAVENOUS | Status: DC | PRN
  Administered 2020-03-31 – 2020-04-10 (×4): 2.5 mg via INTRAVENOUS
  Filled 2020-03-28 (×4): qty 5

## 2020-03-28 MED ORDER — HYDRALAZINE HCL 20 MG/ML IJ SOLN: 5.0000 mg | Freq: Three times a day (TID) | INTRAMUSCULAR | Status: DC | PRN

## 2020-03-28 NOTE — Progress Notes (Signed)
Pt been BP been running BP 132/108- 155/108, HR 103-119. MD made aware will cont to monitor.

## 2020-03-28 NOTE — Progress Notes (Signed)
PROGRESS NOTE    Kenneth Sullivan  DJT:[id number] DOB: 04-Nov-1956 DOA: 03/07/2020 PCP: Medicine, [name]  Brief Narrative:  64 year old gentleman with no significant past medical history presents with shortness of breath and hypoxic had a witnessed bradycardic asystolic cardiac arrest underwent normothermia protocol , was found to have significant massive PE, DVT with RV strain and acute GI bleed from gastric ulcer. He underwent EGD showing oozing gastric ulcer with clot.  He underwent IR guided mechanical thrombectomy, IVC filter placement and GDA embolization.  His hospital course was complicated by multifocal acute to subacute ischemic infarcts involving bilateral cerebral hemispheres consistent with global hypoperfusion related to cardiac arrest associated with scattered petechial hemorrhages with evidence of hemorrhagic conversion in the right parietal lobe. He was started on scopolamine patch for increased secretions without much relief. Chest x-ray remains clear and minimal elevation in WBC count, afebrile.  He was started on doxycycline and IV steroids for possible tracheobronchitis, tracheal aspirate sent for cultures. PCCM on board for trach care, recommended to wait for 10 to 14 days post trach for the stoma to mature to change to cuffless.  Meanwhile SLP evaluation recommending dysphagia 1 diet with thin liquid. He currently has cortrak for tube feeds and nutrition. PEG could not be placed due to national shortages of G tubes but he is on the IR schedule for PEG placement. TOC on board for LTAC vs SNF placement.     Assessment & Plan:   Principal Problem:   Cardiac arrest [facility]) Active Problems:   Encounter for central line placement   GI bleed   Acute respiratory failure (HCC)   Anoxic encephalopathy (HCC)   Cerebral embolism with cerebral infarction   Pulmonary emboli ([place])   Palliative care by specialist   Goals of care, counseling/discussion   DNR (do not  resuscitate)   Cardiac arrest secondary to massive pulmonary embolism S/p  3 rounds of epi, 20 min to ROSC, underwent normothermic protocol.  MRI of the brain shows multiple acute to subacute ischemic infarcts involving bilateral cerebral hemispheres consistent with global hypoperfusion related to cardiac arrest.  S/p IVC filter placement Continue with supportive management   Anoxic encephalopathy CVAs with hemorrhagic conversion MRI showing multiple acute and subacute infarcts with evidence of hemorrhagic conversion Poor prognosis for any meaningful recovery. Palliative care consulted and after goals of care discussions alone, family would like to pursue aggressive management including PEG placement and LTAC placement Family ( his niece) would like to pursue  feeding tubes and  LTAC placement. IR consulted for PEG placement.  To be scheduled next week due to national shortages of G tubes.     Acute respiratory failure with hypoxia secondary to massive PE with cor pulmonale S/p intubation on admission and s/p trach placement on 03/20/2020.  S/p mechanical thrombectomy by IR and IVC filter placement for the acute DVT, unable to anti coagulate due to hemorrhagic conversion of the multiple infarcts.  Currently on trach collar and he is able to tolerate but continues to have copious, thick secretions.  Scopolamine patch to help with secretions.  Doxycycline and IV steroids for tracheobronchitis and tracheal aspirate sent for cultures. Tracheal aspirate shows rare WBC, few gram-positive cocci in pairs and few gram-negative rods in the aspirate incubated for better growth.   Repeat blood cultures ordered and are pending Chest x-ray does not show any infiltrates. Continue with pulmonary hygiene. PCCM recommends to size #4 cuffless trach once stoma is more mature, wait until trach has been  in for at least 14 days.     Tracheobronchitis Tracheal aspirate sent for cultures  patient currently  on IV doxycycline and  IV steroids  Dysphagia As per SLP evaluation patient demonstrating ability to swallow oral without any immediate signs of aspiration with sips of water despite cognitive impairments.  Further MBS today , pt cleared to have dysphagia 1 diet with thin liquids.  Patient currently has a cor track which is unfortunately leaking and tube feeds are on hold. Speech evaluation recommending  Passy muir Speech valve only with SLP with full supervision.      Acute GI bleed secondary to large gastric ulcer S/p GDA embolization. Resume PPI IV twice daily   Nutrition: malnourished.  IR consulted for PEG placement to be scheduled next week. Cor track placed and tubes feeds started but unfortunately started leaking and the tube feeds were held. SLP eval recommending dysphagia 1 diet with thin liquid.   Persistent hypokalemia Replaced.   Acute anemia of blood loss  Hemoglobin stable around 9. transfuse to keep hemoglobin greater than 7.    Agitation As needed Haldol/Ativan ordered.   Patient was found on the floor and his trach was dislodged  On 03/26/2020. PCCM replaced and he was given a dose of Versed.  CT of the head ordered showed, Expected evolution of the hemorrhagic right parietal lobe infarct since 03/14/2020. Satisfactory evolution of the other much smaller infarcts with petechial hemorrhage since last month. No new intracranial abnormality or acute traumatic injury Identified. Right shoulder and pelvis x-rays are negative for fractures. Sitter requested .    Leukocytosis:  Possibly from the steroids.    DVT prophylaxis: SCDs Code Status: DNR Family Communication: None at bedside ,discussed the plan with niece over the phone on 03/27/2020 Disposition Plan:  . Patient came from: Home           Anticipated d/c place: Pending therapy evaluations.  Barriers to d/c OR conditions which need to be met to effect a safe d/c: PEG placement pending and TOC on board.    Consultants:   Gastroenterology  Cardiology  IR  Neurology  PCCM for trach care.   Procedures:  CTA 3/23 >> bilateral PE with RV/with ratio 1.9, emphysema CT head 3/23 >> chronic microvascular changes.  No acute findings Echo 3/23 >> RV is severely dilated with reduced function and D-shaped septum, LVEF 60-65%. LE Venous Duplex 3/24 >> acute DVT in BLE up to the femoral vein  MRI Brain 3/28 >> multifocal acute to subacute ischemic infarcts involving bilateral cerebral hemispheres, findings consistent with global hypoperfusion event related to cardiac arrest, associated scattered petechial hemorrhage about many areas of ischemia, evidence of hemorrhagic conversion in the right parietal lobe. ETT 3/23 >>4/5 Trach 4/5>> L TLC 3/23 >>  ALine L Fem 3/23 >> 3/30    Antimicrobials:  Anti-infectives (From admission, onward)   Start     Dose/Rate Route Frequency Ordered Stop   03/26/20 1100  doxycycline (VIBRAMYCIN) 100 mg in sodium chloride 0.9 % 250 mL IVPB     100 mg 125 mL/hr over 120 Minutes Intravenous 2 times daily 03/26/20 1002     03/25/20 1100  doxycycline (VIBRA-TABS) tablet 100 mg  Status:  Discontinued     100 mg Per Tube Every 12 hours 03/25/20 1009 03/26/20 1002   03/25/20 1015  doxycycline (VIBRAMYCIN) 100 mg in sodium chloride 0.9 % 250 mL IVPB  Status:  Discontinued    Note to Pharmacy: To be given after the  tracheal aspirate is collected.   100 mg 125 mL/hr over 120 Minutes Intravenous Every 12 hours 03/25/20 1005 03/25/20 1008         Subjective: Alert , responding to questions. Does not appear to be in any distress.   Objective: Vitals:   03/28/20 0430 03/28/20 0720 03/28/20 0745 03/28/20 1223  BP: (!) 155/108 (!) 138/111  (!) 143/100  Pulse: (!) 110 (!) 103  98  Resp:  (!) 37  (!) 29  Temp: 98.2 F (36.8 C) 97.7 F (36.5 C)    TempSrc: Oral Axillary  Axillary  SpO2: (!) 89% 93% 93% 95%  Weight:      Height:        Intake/Output Summary  (Last 24 hours) at 03/28/2020 1425 Last data filed at 03/27/2020 1500 Gross per 24 hour  Intake 316.74 ml  Output --  Net 316.74 ml   Filed Weights   03/23/20 0342 03/25/20 0500 03/27/20 0500  Weight: 77.8 kg 77.7 kg 70.3 kg    Examination:  General exam: Ill-appearing cachectic looking gentleman s/p trach on 7 L of high flow oxygen Respiratory system: Coarse breath sounds bilateral, air entry fair, tachypneic. Cardiovascular system: S1-S2 heard, tachycardic, regular, no JVD, trace pedal edema Gastrointestinal system: Abdomen is soft, nontender, nondistended bowel sounds normal Central nervous system: Patient is alert and able to move all his extremities spontaneously Extremities: Trace pedal edema Skin: Excoriations on the back Psychiatry: Cannot be assessed   Data Reviewed: I have personally reviewed following labs and imaging studies  CBC: Recent Labs  Lab 03/23/20 0945 03/24/20 1000 03/27/20 1515 03/28/20 0815  WBC 14.3* 11.8* 13.8* 13.2*  NEUTROABS  --   --  11.4*  --   HGB 11.0* 9.4* 9.9* 10.2*  HCT 33.5* 28.7* 30.4* 31.9*  MCV 96.3 94.1 95.3 97.0  PLT 470* 441* 436* 604   Basic Metabolic Panel: Recent Labs  Lab 03/22/20 0309 03/22/20 0309 03/23/20 0945 03/24/20 1000 03/24/20 2054 03/24/20 2054 03/25/20 1114 03/25/20 1639 03/26/20 1745 03/27/20 0759 03/28/20 0606  NA 136   < > 136 135  --   --  137  --   --  140 136  K 2.9*   < > 3.1* 3.0*  --   --  3.2*  --   --  5.0 5.1  CL 98   < > 100 98  --   --  100  --   --  104 103  CO2 26   < > 24 24  --   --  26  --   --  20* 21*  GLUCOSE 96   < > 89 83  --   --  93  --   --  112* 83  BUN 6*   < > <5* <5*  --   --  <5*  --   --  7* 9  CREATININE 0.77   < > 0.70 0.77  --   --  0.74  --   --  0.69 0.76  CALCIUM 8.1*   < > 8.5* 8.3*  --   --  8.4*  --   --  9.3 8.5*  MG 1.7  --   --  1.7 1.7  --  1.8 2.0  --   --   --   PHOS 2.7   < >  --   --  2.6   < > 2.9 2.7 2.2* 3.3 3.8   < > = values in this interval  not displayed.   GFR: Estimated Creatinine Clearance: 95.2 mL/min (by C-G formula based on SCr of 0.76 mg/dL). Liver Function Tests: Recent Labs  Lab 03/28/20 0606  AST 58*  ALT 12  ALKPHOS 73  BILITOT 1.6*  PROT 6.1*  ALBUMIN 2.8*   No results for input(s): LIPASE, AMYLASE in the last 168 hours. No results for input(s): AMMONIA in the last 168 hours. Coagulation Profile: No results for input(s): INR, PROTIME in the last 168 hours. Cardiac Enzymes: No results for input(s): CKTOTAL, CKMB, CKMBINDEX, TROPONINI in the last 168 hours. BNP (last 3 results) No results for input(s): PROBNP in the last 8760 hours. HbA1C: No results for input(s): HGBA1C in the last 72 hours. CBG: Recent Labs  Lab 03/27/20 1918 03/27/20 2239 03/28/20 0407 03/28/20 0717 03/28/20 1203  GLUCAP 91 75 85 87 101*   Lipid Profile: No results for input(s): CHOL, HDL, LDLCALC, TRIG, CHOLHDL, LDLDIRECT in the last 72 hours. Thyroid Function Tests: No results for input(s): TSH, T4TOTAL, FREET4, T3FREE, THYROIDAB in the last 72 hours. Anemia Panel: No results for input(s): VITAMINB12, FOLATE, FERRITIN, TIBC, IRON, RETICCTPCT in the last 72 hours. Sepsis Labs: No results for input(s): PROCALCITON, LATICACIDVEN in the last 168 hours.  Recent Results (from the past 240 hour(s))  Culture, respiratory (non-expectorated)     Status: None   Collection Time: 03/25/20 12:02 PM   Specimen: Tracheal Aspirate; Respiratory  Result Value Ref Range Status   Specimen Description TRACHEAL ASPIRATE  Final   Special Requests NONE  Final   Gram Stain   Final    RARE WBC PRESENT, PREDOMINANTLY MONONUCLEAR FEW GRAM POSITIVE COCCI IN PAIRS FEW GRAM NEGATIVE RODS    Culture   Final    Consistent with normal respiratory flora. Performed at [facility], [place] [address] [place], [place] [address]    Report Status 03/28/2020 FINAL  Final  Culture, blood (Routine X 2) w Reflex to ID Panel     Status: None  (Preliminary result)   Collection Time: 03/25/20  4:43 PM   Specimen: BLOOD  Result Value Ref Range Status   Specimen Description BLOOD BLOOD LEFT WRIST  Final   Special Requests   Final    BOTTLES DRAWN AEROBIC ONLY Blood Culture adequate volume   Culture   Final    NO GROWTH 3 DAYS Performed at [facility], [place] [address], [place], [place] [address]    Report Status PENDING  Incomplete         Radiology Studies: CT HEAD WO CONTRAST  Result Date: 03/26/2020 CLINICAL DATA:  64 year old male status post numerous bilateral cerebral and cerebellar infarcts late last month, 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 Fall from bed. EXAM: CT HEAD WITHOUT CONTRAST TECHNIQUE: Contiguous axial images were obtained from the base of the skull through the vertex without intravenous contrast. COMPARISON:  Head CT 03/14/2020 and earlier. FINDINGS: Brain: Expected evolution of the right parietal lobe hemorrhagic infarct with fading hyperdense blood products, persistent edema, but decreasing regional mass effect (series 3, image 22). There is also decreasing petechial hemorrhage in the left occipital pole (image 17). Minimal residual blood products in the posterior left temporal lobe (image 16). No extra-axial or intraventricular extension of blood. No ventriculomegaly. No midline shift. Basilar cisterns remain patent. The mostly small numerous additional areas of bilateral ischemia last month are subtle by CT and have faded. No new or increasing cytotoxic edema. No new cortically based infarct or  new intracranial hemorrhage identified. Vascular: No suspicious intracranial vascular hyperdensity. Skull: Stable and intact. Chronic left TMJ degeneration. Sinuses/Orbits: Visualized paranasal sinuses and mastoids are clear. Other: Right nasoenteric tube is visible. Rightward gaze deviation, but no other acute orbit or scalp soft tissue finding. IMPRESSION: 1. Expected  evolution of the hemorrhagic right parietal lobe infarct since 03/14/2020. Satisfactory evolution of the other much smaller infarcts with petechial hemorrhage since last month. The majority of the highly numerous other small infarcts seen in both cerebral and cerebellar hemispheres are occult by CT. 2. No new intracranial abnormality or acute traumatic injury identified. Electronically Signed   By: Genevie Ann M.D.   On: 03/26/2020 19:36        Scheduled Meds: . chlorhexidine gluconate (MEDLINE KIT)  15 mL Mouth Rinse BID  . Chlorhexidine Gluconate Cloth  6 each Topical Q0600  . feeding supplement (PRO-STAT SUGAR FREE 64)  30 mL Per Tube Daily  . free water  200 mL Per Tube Q6H  . mouth rinse  15 mL Mouth Rinse 10 times per day  . methylPREDNISolone (SOLU-MEDROL) injection  40 mg Intravenous Q12H  . pantoprazole  40 mg Intravenous Q12H  . QUEtiapine  12.5 mg Per Tube QHS  . scopolamine  1 patch Transdermal Q72H  . sodium chloride flush  10-40 mL Intracatheter Q12H  . sodium chloride flush  10-40 mL Intracatheter Q12H   Continuous Infusions: . sodium chloride    . dextrose 50 mL/hr at 03/26/20 1934  . doxycycline (VIBRAMYCIN) IV 100 mg (03/28/20 1134)  . feeding supplement (JEVITY 1.2 CAL) 75 mL/hr at 03/25/20 1230     LOS: 21 days        Hosie Poisson, MD [facility]   To contact the attending provider between 7A-7P or the covering provider during after hours 7P-7A, please log into the web site [email or web address] and access using universal [place] password for that web site. If you do not have the password, please call the hospital operator.  03/28/2020, 2:25 PM

## 2020-03-28 NOTE — Progress Notes (Signed)
Daily Progress Note   Patient Name: Kenneth Sullivan       Date: 03/28/2020 DOB: Apr 22, 1956  Age: 64 y.o. MRN#: [id number] Attending Physician: Hosie Poisson, MD Primary Care Physician: [facility], [name] Admit Date: 03/07/2020  Reason for Consultation/Follow-up: Establishing goals of care  Subjective: Patient awake and alert. Denies pain or shortness of breath. Coretrak in place but is not in use due to leakage. Patient following commands. SLP has evaluated and continues to follow with recommendations for thin liquids and puree diet. Per notation patient was able to speak with PMSV during trial with SLP.   Patient was able to mouth "home" to me. Verbalized he still requires extensive care and we are not able to safely discharge him home at this time. He turned his head away grimacing.   Called and spoke with patient's niece, [name] and provided updates. Education provided on current plan of care. [name] asking appropriate questions regarding feeding tube placement and patient's oral intake. Education provided. She verbalized understanding and appreciation.   All questions answered and support given.    Length of Stay: 21  Current Medications: Scheduled Meds:  . chlorhexidine gluconate (MEDLINE KIT)  15 mL Mouth Rinse BID  . Chlorhexidine Gluconate Cloth  6 each Topical Q0600  . feeding supplement (PRO-STAT SUGAR FREE 64)  30 mL Per Tube Daily  . free water  200 mL Per Tube Q6H  . mouth rinse  15 mL Mouth Rinse 10 times per day  . methylPREDNISolone (SOLU-MEDROL) injection  40 mg Intravenous Q12H  . pantoprazole  40 mg Intravenous Q12H  . QUEtiapine  12.5 mg Per Tube QHS  . scopolamine  1 patch Transdermal Q72H  . sodium chloride flush  10-40 mL Intracatheter Q12H  .  sodium chloride flush  10-40 mL Intracatheter Q12H    Continuous Infusions: . sodium chloride    . dextrose 50 mL/hr at 03/26/20 1934  . doxycycline (VIBRAMYCIN) IV 100 mg (03/27/20 2107)  . feeding supplement (JEVITY 1.2 CAL) 75 mL/hr at 03/25/20 1230    PRN Meds: sodium chloride, acetaminophen, dextrose, docusate sodium, gelatin adsorbable, guaiFENesin-dextromethorphan, ipratropium-albuterol, LORazepam, sodium chloride flush  Physical Exam   -awake, alert, NAD, chronically-ill appearing  -tachycardic  -coarse bilateral breath sounds, trach in place   -flat affect    Vital Signs: BP Marland Kitchen)  138/111   Pulse (!) 103   Temp 97.7 F (36.5 C) (Axillary)   Resp (!) 37   Ht 6' 2"  (1.88 m)   Wt 70.3 kg   SpO2 93%   BMI 19.90 kg/m  SpO2: SpO2: 93 % O2 Device: O2 Device: Tracheostomy Collar O2 Flow Rate: O2 Flow Rate (L/min): 5 L/min  Intake/output summary:   Intake/Output Summary (Last 24 hours) at 03/28/2020 1133 Last data filed at 03/27/2020 1500 Gross per 24 hour  Intake 316.74 ml  Output --  Net 316.74 ml   LBM: Last BM Date: 03/23/20 Baseline Weight: Weight: 72.6 kg Most recent weight: Weight: 70.3 kg       Palliative Assessment/Data:    Flowsheet Rows     Most Recent Value  Intake Tab  Referral Department  Critical care  Unit at Time of Referral  ICU  Palliative Care Primary Diagnosis  Pulmonary  Date Notified  03/20/20  Palliative Care Type  New Palliative care  Reason for referral  Clarify Goals of Care  Date of Admission  03/07/20  Date first seen by Palliative Care  03/20/20  # of days Palliative referral response time  0 Day(s)  # of days IP prior to Palliative referral  13  Clinical Assessment  Psychosocial & Spiritual Assessment  Palliative Care Outcomes      Patient Active Problem List   Diagnosis Date Noted  . Palliative care by specialist   . Goals of care, counseling/discussion   . DNR (do not resuscitate)   . Pulmonary emboli ([place])   .  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 Acute respiratory failure ([place])   . Anoxic encephalopathy ([place])   . Cardiac arrest ([place]) 03/07/2020  . GI bleed 03/07/2020  . Encounter for central line placement     Palliative Care Assessment & Plan   Recommendations/Plan:  Continue current plan of care per medical team  Niece remains hopeful for continued improvement. Expresses if patient is able to tolerate pos sufficiently she is ok with no peg being placed, but would want if nutrition will continue to be a barrier to his recovery.   PMT will continue to support and follow   Goals of Care and Additional Recommendations:  Limitations on Scope of Treatment: Full Scope Treatment  Code Status:    Code Status Orders  (From admission, onward)         Start     Ordered   03/16/20 1304  Do not attempt resuscitation (DNR)  Continuous    Question Answer Comment  In the event of cardiac or respiratory ARREST Do not call a "code blue"   In the event of cardiac or respiratory ARREST Do not perform Intubation, CPR, defibrillation or ACLS   In the event of cardiac or respiratory ARREST Use medication by any route, position, wound care, and other measures to relive pain and suffering. May use oxygen, suction and manual treatment of airway obstruction as needed for comfort.      03/16/20 1303        Code Status History    Date Active Date Inactive Code Status Order ID Comments User Context   03/07/2020 0837 03/16/2020 1303 Full Code [id number]  Marshell Garfinkel, MD ED   03/07/2020 0831 03/07/2020 0836 Full Code [id number]  Marshell Garfinkel, MD ED   Advance Care Planning Activity       Prognosis:   Guarded   Discharge Planning:  To Be Determined  Thank you for allowing the  Palliative Medicine Team to assist in the care of this patient.   Time Total: 45 min.   Visit consisted of counseling and education dealing with the complex and emotionally intense issues of symptom  management and palliative care in the setting of serious and potentially life-threatening illness.Greater than 50%  of this time was spent counseling and coordinating care related to the above assessment and plan.  Alda Lea, AGPCNP-BC  Palliative Medicine Team [phone]  Please contact Palliative Medicine Team phone at [phone] for questions and concerns.

## 2020-03-28 NOTE — Progress Notes (Signed)
Modified Barium Swallow Progress Note  Patient Details  Name: Kenneth Sullivan MRN: [id number] Date of Birth: 12-Feb-1956  Today's Date: 03/28/2020  Modified Barium Swallow completed.  Full report located under Chart Review in the Imaging Section.  Brief recommendations include the following:  Clinical Impression  Very challenging study given pts complexity; he was fatigued by transfer, short of breath, clearing secretions tracheally after mobilization and spitting out clear thick oral secretions. Pt confused, not always attentive to activity, rarely followed commands or attended to PO. Despite these challenges, pt did accept two sips of thin barium and 1-2 bites of puree. Oral phase slightly slow, but strong and automatic. Pharyngeal phase completely WNL. PMSV not in place, and defunct cortrak also in place, neither impeded swallow function. Pt should initiate purees and thins; his challenges will be cognitive and PO intake make still be inconsistent. If he can be observed masticating some snack foods he can likely even upgrade solid texture further.    Swallow Evaluation Recommendations       SLP Diet Recommendations: Dysphagia 1 (Puree) solids;Thin liquid   Liquid Administration via: Cup;Straw;Spoon   Medication Administration: Crushed with puree   Supervision: Full assist for feeding   Compensations: Minimize environmental distractions       Oral Care Recommendations: Oral care BID   Other Recommendations: Have oral suction available(PMSV does not need to be in place for PO)   Herbie [place], MA CCC-SLP  Acute Rehabilitation Services Pager [phone] Office [phone]  Lynann Beaver 03/28/2020,10:34 AM

## 2020-03-29 ENCOUNTER — Inpatient Hospital Stay (HOSPITAL_COMMUNITY): Payer: Medicaid Other

## 2020-03-29 DIAGNOSIS — Z66 Do not resuscitate: Secondary | ICD-10-CM | POA: Diagnosis not present

## 2020-03-29 DIAGNOSIS — I63429 Cerebral infarction due to embolism of unspecified anterior cerebral artery: Secondary | ICD-10-CM | POA: Diagnosis not present

## 2020-03-29 DIAGNOSIS — I469 Cardiac arrest, cause unspecified: Secondary | ICD-10-CM | POA: Diagnosis not present

## 2020-03-29 DIAGNOSIS — J9601 Acute respiratory failure with hypoxia: Secondary | ICD-10-CM | POA: Diagnosis not present

## 2020-03-29 DIAGNOSIS — G931 Anoxic brain damage, not elsewhere classified: Secondary | ICD-10-CM | POA: Diagnosis not present

## 2020-03-29 LAB — GLUCOSE, CAPILLARY
Glucose-Capillary: 99 mg/dL (ref 70–99)
Glucose-Capillary: 106 mg/dL — ABNORMAL HIGH (ref 70–99)
Glucose-Capillary: 118 mg/dL — ABNORMAL HIGH (ref 70–99)
Glucose-Capillary: 108 mg/dL — ABNORMAL HIGH (ref 70–99)
Glucose-Capillary: 97 mg/dL (ref 70–99)

## 2020-03-29 LAB — PHOSPHORUS
Phosphorus: 3.8 mg/dL (ref 2.5–4.6)
Phosphorus: 3.2 mg/dL (ref 2.5–4.6)

## 2020-03-29 MED ORDER — IOHEXOL 300 MG/ML  SOLN
75.0000 mL | Freq: Once | INTRAMUSCULAR | Status: AC | PRN
  Administered 2020-03-29: 75 mL via INTRAVENOUS

## 2020-03-29 NOTE — Progress Notes (Signed)
Patient refusing PO intake.  He spit crushed meds in applesauce out.  Wasted those meds and obtained new meds and attempted a second time to administer.  Patient refusing, will hold and try again after anxiety subsides. Ativan administered for anxiety.

## 2020-03-29 NOTE — Progress Notes (Signed)
  Speech Language Pathology Treatment: Dysphagia;[place] Speaking valve  Patient Details Name: Kenneth Sullivan MRN: [id number] DOB: 10/02/1956 Today's Date: 03/29/2020 Time: [id number] SLP Time Calculation (min) (ACUTE ONLY): 10 min  Assessment / Plan / Recommendation Clinical Impression  Session quite short due to pt reproors of dental pain (RN and MD informed). Pt accepted several consecutive sips of orange juice with an immediate cough. Would not accept more and was moaning and crying out about his tooth. Pt was able to speak clearly with PMSV briefly in place pt could not sustain placement due to valve often blowing off with this effort. Anticipate significant improvement if trach is downsized. Recommend pt continue to be offered thin liquids and purees (asked MD to remove broken NG tube). Will follow for further trials as pts comfort improves.   HPI HPI: 64 year old admitted 3/23 with shortness of breath with sats in the 40s on with witnessed bradycardic, asystolic arrest when EMS was present with 3 rounds of epi, 20 minutes to ROSC per chart. Found to also have bilateral PR's and had GI bleed following arrest. MRI showed multifocal acute-subactue ischemic infarcts c/w global hypoperfusion. Intubated 3/23 adn trach placed 4/2. CXR 4/3 no active disease. No evidence of pneumonia or pulmonary edema. Per chart pt smokes 1 pack/day, consumes 2-3 forty oz beers daily, + marijuana.      SLP Plan  Continue with current plan of care       Recommendations  Diet recommendations: Dysphagia 1 (puree);Thin liquid Liquids provided via: Straw Medication Administration: Crushed with puree Supervision: Staff to assist with self feeding Compensations: Minimize environmental distractions Postural Changes and/or Swallow Maneuvers: Seated upright 90 degrees                Oral Care Recommendations: Oral care QID Follow up Recommendations: Skilled Nursing facility SLP Visit Diagnosis: Dysphagia,  unspecified (R13.10) Plan: Continue with current plan of care       GO               Herbie [place], MA [place] Pager [phone] Office [phone]  Lynann Beaver 03/29/2020, 11:05 AM

## 2020-03-29 NOTE — Progress Notes (Signed)
RT came to check on patient. Audile trach noises from the door due to secretion. Patient had refused suctioning at 1200. I instructed patient on what I was going to do, and the suctioned. Patient became very combative. Started flailing in bed and spit at me. I finished suctioning, tried to calm patient down. Told him I was going to change his dressing and he became very agitated and told me no. Patient stable from RT standpoint at the moment, and RN to be notified

## 2020-03-29 NOTE — Progress Notes (Signed)
PROGRESS NOTE  Kenneth Sullivan WGN:[id number] DOB: [birth year] DOA: 03/07/2020 PCP: Medicine, [name]   LOS: 22 days   Brief Narrative / Interim history: 64 year old with no significant past medical history came into the hospital with shortness of breath, was hypoxic, had a witnessed bradycardic event lead into asystole/cardiac arrest.  Was found to have significantly massive PE/DVT and RV strain, also was found to have an acute GI bleed from a gastric ulcer.  He underwent IR guided mechanical thrombectomy, IVC filter and GDA embolization.  Also underwent an EGD which showed oozing gastric ulcer with clot.  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  Currently has a trach in place and a PEG tube placement is pending, there is a Producer, television/film/video apparently and IR is waiting for shipment.  TOC on board for LTAC versus SNF placement.  Subjective / 24h Interval events: Complains of severe tooth pain but difficult to understand due to tracheostomy.  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  MRI of the brain showed multiple acute to subacute infarcts involving bilateral cerebral hemisphere consistent with global hypoperfusion related to cardiac arrest.  Underwent IR guided mechanical thrombectomy, IVC filter.  MRI also showed evidence of hemorrhagic conversion of his infarcts. Overall poor prognosis, palliative care consulted, he is DNR but we are to continue aggressive medical management including PEG tube as well as placement  Active Problems Acute  respiratory failure with hypoxia-due to PE, he required intubation and eventually ended up having trach on 4/5.  Currently on trach collar but continues to have copious and thick thick secretions, he is getting scopolamine patch.  Afebrile, minimal WBC, continue to monitor clinically.  PCCM recommends #4 cuffless trach once stoma is more mature, with at least 14 days.  Patient had an event on 4/11 was found on the floor and trach was dislodged  Tracheobronchitis-tracheal aspirate sent for cultures, currently on doxycycline and steroids started on 4/10, plan for probably 7 total days, today day 4  Acute blood loss anemia due to acute GI bleed secondary to large gastric ulcer-GI consulted, underwent an EGD and eventually requiring GDA embolization.  Continue PPI.  Hemoglobin stable, repeat tomorrow morning  Toothache-patient does complain of severe tooth ache, history is difficult to obtain due to tracheostomy status, have obtained a CT scan of the maxillofacial which showed chronic left TMJ degeneration without superimposed acute abnormalities or acute inflammatory processes.  Symptomatic management  Agitation/ICU delirium-alert, appropriate this morning, as needed Haldol/Ativan  Dysphagia -speech evaluated, currently on dysphagia 1 with thin liquids however unlikely that he would meet his nutritional needs.  PEG tube placement pending, apparently there is a Producer, television/film/video and IR will place one when stock will be renewed  Persistent hypokalemia -replete and monitor   Scheduled Meds: . chlorhexidine gluconate (MEDLINE KIT)  15 mL Mouth Rinse BID  . Chlorhexidine Gluconate Cloth  6 each Topical Q0600  . feeding supplement (PRO-STAT SUGAR FREE 64)  30 mL Per Tube Daily  . free water  200 mL Per Tube Q6H  . mouth rinse  15 mL Mouth Rinse 10 times per day  . methylPREDNISolone (SOLU-MEDROL) injection  40 mg Intravenous Q12H  . pantoprazole  40 mg Intravenous Q12H  . QUEtiapine  12.5 mg Per Tube  QHS  . scopolamine  1 patch Transdermal Q72H  . sodium chloride flush  10-40 mL Intracatheter Q12H  . sodium chloride flush  10-40 mL Intracatheter Q12H   Continuous Infusions: . sodium chloride    . dextrose 50 mL/hr at 03/29/20 0205  . doxycycline (VIBRAMYCIN) IV 100 mg (03/29/20 1213)  . feeding supplement (JEVITY 1.2 CAL) 75 mL/hr at 03/25/20 1230   PRN Meds:.sodium chloride, acetaminophen, dextrose, docusate sodium, gelatin adsorbable, guaiFENesin-dextromethorphan, hydrALAZINE, ipratropium-albuterol, LORazepam, metoprolol tartrate, sodium chloride flush  DVT prophylaxis: SCDs Code Status: DNR Family Communication: no family at bedside  Patient admitted from: home Anticipated d/c place: SNF vs LTACH  Barriers to d/c: Awaiting PEG tube placement as well as bed  Consultants:  PCCM IR Palliative care  Procedures:  EGD GDA embolization Tracheostomy 4/5  Microbiology  Respiratory culture 4/10-normal respiratory flora  Antimicrobials: Doxycycline 4/10 >>   Objective: Vitals:   03/29/20 0405 03/29/20 0819 03/29/20 1126 03/29/20 1200  BP:  (!) 140/94  136/90  Pulse: 82 84 98   Resp: 18 18 18    Temp:  98.8 F (37.1 C)  97.6 F (36.4 C)  TempSrc:  Axillary  Oral  SpO2: 97% 100% 98%   Weight:      Height:        Intake/Output Summary (Last 24 hours) at 03/29/2020 1313 Last data filed at 03/28/2020 2300 Gross per 24 hour  Intake 200 ml  Output -  Net 200 ml   Filed Weights   03/23/20 0342 03/25/20 0500 03/27/20 0500  Weight: 77.8 kg 77.7 kg 70.3 kg    Examination:  Constitutional: NAD Eyes: no scleral icterus ENMT: Mucous membranes are moist.  Neck: normal, supple Respiratory: Coarse breath sounds bilaterally, no wheezing, productive cough heard.  Moves air well Cardiovascular: Regular rate and rhythm, no murmurs / rubs / gallops. No LE edema.  Abdomen: non distended, no tenderness. Bowel sounds positive.  Musculoskeletal: no clubbing / cyanosis.  Skin:  no rashes Neurologic: Nonfocal   Data Reviewed: I have independently reviewed following labs and imaging studies   CBC: Recent Labs  Lab 03/23/20 0945 03/24/20 1000 03/27/20 1515 03/28/20 0815  WBC 14.3* 11.8* 13.8* 13.2*  NEUTROABS  --   --  11.4*  --   HGB 11.0* 9.4* 9.9* 10.2*  HCT 33.5* 28.7* 30.4* 31.9*  MCV 96.3 94.1 95.3 97.0  PLT 470* 441* 436* 778   Basic Metabolic Panel: Recent Labs  Lab 03/23/20 0945 03/24/20 1000 03/24/20 2054 03/24/20 2054 03/25/20 1114 03/25/20 1114 03/25/20 1639 03/25/20 1639 03/26/20 1745 03/27/20 0759 03/28/20 0606 03/28/20 1708 03/29/20 0510  NA 136 135  --   --  137  --   --   --   --  140 136  --   --   K 3.1* 3.0*  --   --  3.2*  --   --   --   --  5.0 5.1  --   --   CL 100 98  --   --  100  --   --   --   --  104 103  --   --   CO2 24 24  --   --  26  --   --   --   --  20* 21*  --   --   GLUCOSE 89 83  --   --  93  --   --   --   --  112* 83  --   --   BUN <5* <5*  --   --  <5*  --   --   --   --  7* 9  --   --   CREATININE 0.70 0.77  --   --  0.74  --   --   --   --  0.69 0.76  --   --   CALCIUM 8.5* 8.3*  --   --  8.4*  --   --   --   --  9.3 8.5*  --   --   MG  --  1.7 1.7  --  1.8  --  2.0  --   --   --   --   --   --   PHOS  --   --  2.6   < > 2.9   < > 2.7   < > 2.2* 3.3 3.8 3.6 3.8   < > = values in this interval not displayed.   Liver Function Tests: Recent Labs  Lab 03/28/20 0606  AST 58*  ALT 12  ALKPHOS 73  BILITOT 1.6*  PROT 6.1*  ALBUMIN 2.8*   Coagulation Profile: No results for input(s): INR, PROTIME in the last 168 hours. HbA1C: No results for input(s): HGBA1C in the last 72 hours. CBG: Recent Labs  Lab 03/28/20 2055 03/28/20 2333 03/29/20 0313 03/29/20 0731 03/29/20 1202  GLUCAP 104* 99 99 106* 118*    Recent Results (from the past 240 hour(s))  Culture, respiratory (non-expectorated)     Status: None   Collection Time: 03/25/20 12:02 PM   Specimen: Tracheal Aspirate; Respiratory   Result Value Ref Range Status   Specimen Description TRACHEAL ASPIRATE  Final   Special Requests NONE  Final   Gram Stain   Final    RARE WBC PRESENT, PREDOMINANTLY MONONUCLEAR FEW GRAM POSITIVE COCCI IN PAIRS FEW GRAM NEGATIVE RODS    Culture   Final    Consistent with normal respiratory flora. Performed at [facility], [place] [address] [place], [place] [address]    Report Status 03/28/2020 FINAL  Final  Culture, blood (Routine X 2) w Reflex to ID Panel     Status: None (Preliminary result)   Collection Time: 03/25/20  4:43 PM   Specimen: BLOOD  Result Value Ref Range Status   Specimen Description BLOOD BLOOD LEFT WRIST  Final   Special Requests   Final    BOTTLES DRAWN AEROBIC ONLY Blood Culture adequate volume   Culture NO GROWTH 4 DAYS  Final   Report Status PENDING  Incomplete     Radiology Studies: CT MAXILLOFACIAL W CONTRAST  Result Date: 03/29/2020 CLINICAL DATA:  64 year old male with TMJ pain, limited movement. Recent cerebral infarcts. EXAM: CT MAXILLOFACIAL WITH CONTRAST TECHNIQUE: Multidetector CT imaging of the maxillofacial structures was performed with intravenous contrast. Multiplanar CT image reconstructions were also generated. CONTRAST:  42m OMNIPAQUE IOHEXOL 300 MG/ML  SOLN COMPARISON:  Head CT 03/26/2020, and earlier. FINDINGS: Osseous: Mandible intact and normally located. The right TMJ appears fairly normal for age. There is chronic left TMJ degeneration with joint space loss, osteophytosis, subchondral sclerosis and subchondral cysts. No acute osseous abnormality identified. Absent maxillary dentition. Intact maxilla and zygoma. Chronic appearing nasal bone fractures. Central skull base and visible cervical vertebrae appear intact. Orbits: Intact orbital walls. Rightward gaze, but otherwise normal orbits soft tissues. Sinuses: Clear  throughout. Soft tissues: Tracheostomy in place, partially visible with no adverse features. Otherwise negative visible  larynx, pharynx, parapharyngeal spaces, retropharyngeal space, sublingual space, submandibular spaces, masticator and parotid spaces. No discrete soft tissue inflammation identified in the face. The visible major vascular structures are patent in the upper neck and at the skull base. The left vertebral artery is dominant. No upper cervical lymphadenopathy. Limited intracranial: Stable from the recent CT. IMPRESSION: 1. Chronic left TMJ degeneration. But no superimposed acute osseous abnormality or acute inflammatory processes identified. 2. Partially visible tracheostomy with no adverse features. Electronically Signed   By: Genevie Ann M.D.   On: 03/29/2020 12:31   Marzetta Board, MD, PhD [facility]  Between 7 am - 7 pm I am available, please contact me via Amion or Securechat  Between 7 pm - 7 am I am not available, please contact night coverage MD/APP via Amion

## 2020-03-30 DIAGNOSIS — I469 Cardiac arrest, cause unspecified: Secondary | ICD-10-CM | POA: Diagnosis not present

## 2020-03-30 DIAGNOSIS — G931 Anoxic brain damage, not elsewhere classified: Secondary | ICD-10-CM | POA: Diagnosis not present

## 2020-03-30 DIAGNOSIS — I63429 Cerebral infarction due to embolism of unspecified anterior cerebral artery: Secondary | ICD-10-CM | POA: Diagnosis not present

## 2020-03-30 DIAGNOSIS — J9601 Acute respiratory failure with hypoxia: Secondary | ICD-10-CM | POA: Diagnosis not present

## 2020-03-30 LAB — GLUCOSE, CAPILLARY
Glucose-Capillary: >600 mg/dL (ref 70–99)
Glucose-Capillary: 107 mg/dL — ABNORMAL HIGH (ref 70–99)
Glucose-Capillary: 111 mg/dL — ABNORMAL HIGH (ref 70–99)
Glucose-Capillary: 110 mg/dL — ABNORMAL HIGH (ref 70–99)
Glucose-Capillary: 92 mg/dL (ref 70–99)
Glucose-Capillary: 108 mg/dL — ABNORMAL HIGH (ref 70–99)

## 2020-03-30 LAB — COMPREHENSIVE METABOLIC PANEL
ALT: 20 U/L (ref 0–44)
AST: 26 U/L (ref 15–41)
Albumin: 2.5 g/dL — ABNORMAL LOW (ref 3.5–5.0)
Alkaline Phosphatase: 71 U/L (ref 38–126)
Anion gap: 9 (ref 5–15)
BUN: 6 mg/dL — ABNORMAL LOW (ref 8–23)
CO2: 28 mmol/L (ref 22–32)
Calcium: 8.8 mg/dL — ABNORMAL LOW (ref 8.9–10.3)
Chloride: 103 mmol/L (ref 98–111)
Creatinine, Ser: 0.78 mg/dL (ref 0.61–1.24)
GFR calc Af Amer: >60 mL/min (ref >60)
GFR calc non Af Amer: >60 mL/min (ref >60)
Glucose, Bld: 110 mg/dL — ABNORMAL HIGH (ref 70–99)
Potassium: 3.3 mmol/L — ABNORMAL LOW (ref 3.5–5.1)
Sodium: 140 mmol/L (ref 135–145)
Total Bilirubin: 0.9 mg/dL (ref 0.3–1.2)
Total Protein: 6.4 g/dL — ABNORMAL LOW (ref 6.5–8.1)

## 2020-03-30 LAB — CBC
HCT: 32.2 % — ABNORMAL LOW (ref 39.0–52.0)
Hemoglobin: 10.7 g/dL — ABNORMAL LOW (ref 13.0–17.0)
MCH: 30.5 pg (ref 26.0–34.0)
MCHC: 33.2 g/dL (ref 30.0–36.0)
MCV: 91.7 fL (ref 80.0–100.0)
Platelets: 449 10*3/uL — ABNORMAL HIGH (ref 150–400)
RBC: 3.51 MIL/uL — ABNORMAL LOW (ref 4.22–5.81)
RDW: 16.3 % — ABNORMAL HIGH (ref 11.5–15.5)
WBC: 11.3 10*3/uL — ABNORMAL HIGH (ref 4.0–10.5)
nRBC: 0 % (ref 0.0–0.2)

## 2020-03-30 LAB — MAGNESIUM: Magnesium: 2 mg/dL (ref 1.7–2.4)

## 2020-03-30 LAB — CULTURE, BLOOD (ROUTINE X 2)
Culture: NO GROWTH
Special Requests: ADEQUATE

## 2020-03-30 LAB — PHOSPHORUS: Phosphorus: 3.3 mg/dL (ref 2.5–4.6)

## 2020-03-30 MED ORDER — POTASSIUM CHLORIDE 20 MEQ/15ML (10%) PO SOLN
40.0000 meq | Freq: Once | ORAL | Status: DC
  Filled 2020-03-30: qty 30

## 2020-03-30 NOTE — Progress Notes (Signed)
PROGRESS NOTE  Kenneth Sullivan GEX:[id number] DOB: Jan 11, 1956 DOA: 03/07/2020 PCP: Medicine, [name]   LOS: 23 days   Brief Narrative / Interim history: 64 year old with no significant past medical history came into the hospital with shortness of breath, was hypoxic, had a witnessed bradycardic event lead into asystole/cardiac arrest.  Was found to have significantly massive PE/DVT and RV strain, also was found to have an acute GI bleed from a gastric ulcer.  He underwent IR guided mechanical thrombectomy, IVC filter and GDA embolization.  Also underwent an EGD which showed oozing gastric ulcer with clot.  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  Currently has a trach in place and a PEG tube placement is pending, there is a Producer, television/film/video apparently and IR is waiting for shipment.  TOC on board for LTAC versus SNF placement.  Subjective / 24h Interval events: No complaints this morning, difficult to understand due to tracheostomy.  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  MRI of the brain showed multiple acute to subacute infarcts involving bilateral cerebral hemisphere consistent with global hypoperfusion related to cardiac arrest.  Underwent IR guided mechanical thrombectomy, IVC filter.  MRI also showed evidence of hemorrhagic conversion of his infarcts. Overall poor prognosis, palliative care consulted, he is DNR but we are to continue aggressive medical management including PEG tube as well as placement.  Awaiting IR  Active  Problems Acute respiratory failure with hypoxia-due to PE, he required intubation and eventually ended up having trach on 4/5.  Currently on trach collar but continues to have copious and thick thick secretions, he is getting scopolamine patch.  Afebrile, minimal WBC, continue to monitor clinically.  PCCM recommends #4 cuffless trach once stoma is more mature, with at least 14 days.  Patient had an event on 4/11 was found on the floor and trach was dislodged.  Respiratory status remained stable  Tracheobronchitis-tracheal aspirate sent for cultures, currently on doxycycline and steroids started on 4/10, plan for probably 7 total days, today day 5  Acute blood loss anemia due to acute GI bleed secondary to large gastric ulcer-GI consulted, underwent an EGD and eventually requiring GDA embolization.  Continue PPI.  Hemoglobin remained stable  Toothache-patient does complain of severe tooth ache, history is difficult to obtain due to tracheostomy status, have obtained a CT scan of the maxillofacial which showed chronic left TMJ degeneration without superimposed acute abnormalities or acute inflammatory processes.  Symptomatic management  Agitation/ICU delirium-as needed Haldol/Ativan  Dysphagia -speech evaluated, currently on dysphagia 1 with thin liquids however unlikely that he would meet his nutritional needs.  PEG tube placement pending, apparently there is a Producer, television/film/video and IR will place one when stock will be renewed.  Discussed with nutrition today, resume core track tube feeds pending PEG since he is not eating any p.o.  Persistent hypokalemia -replete again this morning   Scheduled Meds: . chlorhexidine gluconate (MEDLINE KIT)  15 mL Mouth Rinse BID  . Chlorhexidine Gluconate Cloth  6 each Topical Q0600  . feeding supplement (PRO-STAT SUGAR FREE 64)  30 mL Per Tube Daily  . free water  200 mL Per Tube  Q6H  . mouth rinse  15 mL Mouth Rinse 10 times per day  . methylPREDNISolone  (SOLU-MEDROL) injection  40 mg Intravenous Q12H  . pantoprazole  40 mg Intravenous Q12H  . QUEtiapine  12.5 mg Per Tube QHS  . scopolamine  1 patch Transdermal Q72H  . sodium chloride flush  10-40 mL Intracatheter Q12H  . sodium chloride flush  10-40 mL Intracatheter Q12H   Continuous Infusions: . sodium chloride    . dextrose 50 mL/hr at 03/29/20 0205  . doxycycline (VIBRAMYCIN) IV 100 mg (03/30/20 0914)  . feeding supplement (JEVITY 1.2 CAL) 75 mL/hr at 03/25/20 1230   PRN Meds:.sodium chloride, acetaminophen, dextrose, docusate sodium, gelatin adsorbable, guaiFENesin-dextromethorphan, hydrALAZINE, ipratropium-albuterol, LORazepam, metoprolol tartrate, sodium chloride flush  DVT prophylaxis: SCDs Code Status: DNR Family Communication: no family at bedside  Patient admitted from: home Anticipated d/c place: SNF vs LTACH  Barriers to d/c: Awaiting PEG tube placement as well as bed  Consultants:  PCCM IR Palliative care  Procedures:  EGD GDA embolization Tracheostomy 4/5  Microbiology  Respiratory culture 4/10-normal respiratory flora  Antimicrobials: Doxycycline 4/10 >>   Objective: Vitals:   03/30/20 0420 03/30/20 0540 03/30/20 0737 03/30/20 0900  BP:  (!) 134/91  (!) 157/111  Pulse: 86 90  87  Resp: (!) 22 18 20 20   Temp:  98.1 F (36.7 C)  98.1 F (36.7 C)  TempSrc:  Axillary    SpO2: 92% 94% 90% 92%  Weight:      Height:        Intake/Output Summary (Last 24 hours) at 03/30/2020 1150 Last data filed at 03/29/2020 2016 Gross per 24 hour  Intake --  Output 800 ml  Net -800 ml   Filed Weights   03/23/20 0342 03/25/20 0500 03/27/20 0500  Weight: 77.8 kg 77.7 kg 70.3 kg    Examination:  Constitutional: No distress Eyes: No icterus ENMT: Moist mucous membranes Neck: normal, supple Respiratory: Coarse breath sounds bilaterally without wheezing Cardiovascular: Regular rate and rhythm, no peripheral edema Abdomen: Soft, NT, ND, bowel sounds  positive Musculoskeletal: no clubbing / cyanosis.  Skin: No rashes seen Neurologic: Does not follow commands consistently but no focal deficits   Data Reviewed: I have independently reviewed following labs and imaging studies   CBC: Recent Labs  Lab 03/24/20 1000 03/27/20 1515 03/28/20 0815 03/30/20 0539  WBC 11.8* 13.8* 13.2* 11.3*  NEUTROABS  --  11.4*  --   --   HGB 9.4* 9.9* 10.2* 10.7*  HCT 28.7* 30.4* 31.9* 32.2*  MCV 94.1 95.3 97.0 91.7  PLT 441* 436* 391 161*   Basic Metabolic Panel: Recent Labs  Lab 03/24/20 1000 03/24/20 2054 03/24/20 2054 03/25/20 1114 03/25/20 1114 03/25/20 1639 03/26/20 1745 03/27/20 0759 03/27/20 0759 03/28/20 0606 03/28/20 1708 03/29/20 0510 03/29/20 1637 03/30/20 0539  NA 135  --   --  137  --   --   --  140  --  136  --   --   --  140  K 3.0*  --   --  3.2*  --   --   --  5.0  --  5.1  --   --   --  3.3*  CL 98  --   --  100  --   --   --  104  --  103  --   --   --  103  CO2 24  --   --  26  --   --   --  20*  --  21*  --   --   --  28  GLUCOSE 83  --   --  93  --   --   --  112*  --  83  --   --   --  110*  BUN <5*  --   --  <5*  --   --   --  7*  --  9  --   --   --  6*  CREATININE 0.77  --   --  0.74  --   --   --  0.69  --  0.76  --   --   --  0.78  CALCIUM 8.3*  --   --  8.4*  --   --   --  9.3  --  8.5*  --   --   --  8.8*  MG 1.7 1.7  --  1.8  --  2.0  --   --   --   --   --   --   --  2.0  PHOS  --  2.6   < > 2.9   < > 2.7   < > 3.3   < > 3.8 3.6 3.8 3.2 3.3   < > = values in this interval not displayed.   Liver Function Tests: Recent Labs  Lab 03/28/20 0606 03/30/20 0539  AST 58* 26  ALT 12 20  ALKPHOS 73 71  BILITOT 1.6* 0.9  PROT 6.1* 6.4*  ALBUMIN 2.8* 2.5*   Coagulation Profile: No results for input(s): INR, PROTIME in the last 168 hours. HbA1C: No results for input(s): HGBA1C in the last 72 hours. CBG: Recent Labs  Lab 03/29/20 1202 03/29/20 1624 03/29/20 2004 03/30/20 0722 03/30/20 1121   GLUCAP 118* 108* 97 107* 111*    Recent Results (from the past 240 hour(s))  Culture, respiratory (non-expectorated)     Status: None   Collection Time: 03/25/20 12:02 PM   Specimen: Tracheal Aspirate; Respiratory  Result Value Ref Range Status   Specimen Description TRACHEAL ASPIRATE  Final   Special Requests NONE  Final   Gram Stain   Final    RARE WBC PRESENT, PREDOMINANTLY MONONUCLEAR FEW GRAM POSITIVE COCCI IN PAIRS FEW GRAM NEGATIVE RODS    Culture   Final    Consistent with normal respiratory flora. Performed at [facility], [place] [address], [place], [place] [address]    Report Status 03/28/2020 FINAL  Final  Culture, blood (Routine X 2) w Reflex to ID Panel     Status: None (Preliminary result)   Collection Time: 03/25/20  4:43 PM   Specimen: BLOOD  Result Value Ref Range Status   Specimen Description BLOOD BLOOD LEFT WRIST  Final   Special Requests   Final    BOTTLES DRAWN AEROBIC ONLY Blood Culture adequate volume   Culture NO GROWTH 4 DAYS  Final   Report Status PENDING  Incomplete     Radiology Studies: No results found. Marzetta Board, MD, PhD [facility]  Between 7 am - 7 pm I am available, please contact me via Amion or Securechat  Between 7 pm - 7 am I am not available, please contact night coverage MD/APP via Amion

## 2020-03-30 NOTE — Progress Notes (Signed)
Nutrition Follow-up  DOCUMENTATION CODES:   Not applicable  INTERVENTION:  Monitor for bowel movement, none documented since 4/8.  Place Cortrak and resume tube feeding until PEG can be placed:  Jevity 1.2 at 25 ml/h, increase by 10 ml every 8 hours to goal rate of 75 ml/h. Pro-stat 30 ml once daily.  Provides 2260 kcal, 115 gm protein, 1458 ml free water daily.  Will require free water flushes 200 ml QID for a total of 2258 ml per day to meet fluid needs.  Monitor magnesium, potassium, and phosphorus daily for at least 3 days, MD to replete as needed, as pt is at risk for refeeding syndrome.  NUTRITION DIAGNOSIS:   Inadequate oral intake related to inability to eat as evidenced by NPO status.  Progressing, pt now on Dysphagia 1 diet with thin liquids  GOAL:   Patient will meet greater than or equal to 90% of their needs  Not met.  MONITOR:   Vent status, Labs, I & O's  REASON FOR ASSESSMENT:   Ventilator    ASSESSMENT:   64 yo male admitted S/P cardiac arrest, S/P normothermia protocol. Found to have massive PE, DVT, GI bleed from gastric ulcer. PMH includes current smoker, heavy alcohol use.  4/02 - trach  4/09 - Cortrak placed  4/11 - pt pulled out trach, replaced by RT; TF held due to Cortrak leaking  4/13 - MBS, Dysphagia 1 with thin liquids started 4/14 - Cortrak removed  Pt agitated at time of RD visit.   Discussed pt with RN. Per RN, plan is currently to wait for PEG placement; however, unsure of when PEG can be placed due to national shortage. RD sent secure chat to MD regarding placement of Cortrak until PEG can be placed. Cortrak consult placed.   Pt noted to be refusing PO intake and is spitting out meds crushed in applesauce.  Pt has tube feeding orders with no current access: Jevity 1.2 at 25 ml/h, increase by 10 ml every 8 hours to goal rate of 75 ml/h. Pro-stat 30 ml once daily, free water flushes 200 ml QID for a total of 2258 ml per  day  UOP: 828m x24 hours I/O: +14,085.749msince admit  Of note, no bowel movement documented since 4/8. Sent secure chat to MD.   Labs reviewed: K+ 3.3 (L), CBGs 97-118 Medications reviewed and include: Solu-medrol   NUTRITION - FOCUSED PHYSICAL EXAM:  Deferred due to patient being agitated. Will attempt at follow-up.  Diet Order:   Diet Order            DIET - DYS 1 Room service appropriate? Yes; Fluid consistency: Thin  Diet effective now              EDUCATION NEEDS:   Not appropriate for education at this time  Skin:  Skin Assessment: Skin Integrity Issues: Skin Integrity Issues:: Other (Comment) Other: puncture neck, groin  Last BM:  4/8  Height:   Ht Readings from Last 1 Encounters:  03/07/20 6' 2"  (1.88 m)    Weight:   Wt Readings from Last 1 Encounters:  03/27/20 70.3 kg    BMI:  Body mass index is 19.9 kg/m.  Estimated Nutritional Needs:   Kcal:  2100-2300  Protein:  110-125 gm  Fluid:  >/= 2 L   AmLarkin InaMS, RD, LDN RD pager number and weekend/on-call pager number located in [facility] [place]

## 2020-03-31 DIAGNOSIS — G931 Anoxic brain damage, not elsewhere classified: Secondary | ICD-10-CM | POA: Diagnosis not present

## 2020-03-31 DIAGNOSIS — I63429 Cerebral infarction due to embolism of unspecified anterior cerebral artery: Secondary | ICD-10-CM | POA: Diagnosis not present

## 2020-03-31 DIAGNOSIS — J9601 Acute respiratory failure with hypoxia: Secondary | ICD-10-CM | POA: Diagnosis not present

## 2020-03-31 DIAGNOSIS — I469 Cardiac arrest, cause unspecified: Secondary | ICD-10-CM | POA: Diagnosis not present

## 2020-03-31 LAB — BASIC METABOLIC PANEL
Anion gap: 13 (ref 5–15)
BUN: 7 mg/dL — ABNORMAL LOW (ref 8–23)
CO2: 25 mmol/L (ref 22–32)
Calcium: 8.8 mg/dL — ABNORMAL LOW (ref 8.9–10.3)
Chloride: 101 mmol/L (ref 98–111)
Creatinine, Ser: 0.74 mg/dL (ref 0.61–1.24)
GFR calc Af Amer: >60 mL/min (ref >60)
GFR calc non Af Amer: >60 mL/min (ref >60)
Glucose, Bld: 117 mg/dL — ABNORMAL HIGH (ref 70–99)
Potassium: 3.7 mmol/L (ref 3.5–5.1)
Sodium: 139 mmol/L (ref 135–145)

## 2020-03-31 LAB — GLUCOSE, CAPILLARY
Glucose-Capillary: 111 mg/dL — ABNORMAL HIGH (ref 70–99)
Glucose-Capillary: 110 mg/dL — ABNORMAL HIGH (ref 70–99)
Glucose-Capillary: 111 mg/dL — ABNORMAL HIGH (ref 70–99)
Glucose-Capillary: 137 mg/dL — ABNORMAL HIGH (ref 70–99)
Glucose-Capillary: 141 mg/dL — ABNORMAL HIGH (ref 70–99)

## 2020-03-31 LAB — PHOSPHORUS
Phosphorus: 3.8 mg/dL (ref 2.5–4.6)
Phosphorus: 3.6 mg/dL (ref 2.5–4.6)

## 2020-03-31 NOTE — Progress Notes (Signed)
  Speech Language Pathology Treatment: Dysphagia;Passy Muir Speaking valve  Patient Details Name: Kenneth Sullivan MRN: [id number] DOB: 19-Jun-1956 Today's Date: 03/31/2020 Time: [id number] SLP Time Calculation (min) (ACUTE ONLY): 23 min  Assessment / Plan / Recommendation Clinical Impression  Pt seen for dysphagia and verbalization and use of upper airway with PMV. RN had questions re: PMV while taking meds and therapist provided education re: check for cuff deflation prior to PMV placement, importance of upright position before all po's. Pt consumed several bites and sips puree and thin before declining additional trials without s/s aspiration. Throughout session pt crying, moving head side to side and moving legs in bed out of frustration when SLP explaining info or making requests. Pt mouthing words and initially did not want valve placed then agreeable and tolerated up to 2 minute intervals. Vocal intensity low due to reduced respiratory support and hoarseness affecting intelligibility. SLP provided empathetic listening and when asked if he is frustrated he says no but actions portray otherwise. SLP educated re: importance of trying to eat as much as he is able. He become frustrated he was able to increase intensity and intelligibility pt said "around the corner; wanna go around the corner" and when questioned more he stated "weed" and asked this therapist for a dime.  Pt getting a Cortrak this afternoon which is just temporary means to increase nutrition. Noted in chart recommendation and discussion via RD for PEG. ST will continue to see for PMV and dysphagia.    HPI HPI: 64 year old admitted 3/23 with shortness of breath with sats in the 40s on with witnessed bradycardic, asystolic arrest when EMS was present with 3 rounds of epi, 20 minutes to ROSC per chart. Found to also have bilateral PR's and had GI bleed following arrest. MRI showed multifocal acute-subactue ischemic infarcts c/w global  hypoperfusion. Intubated 3/23 adn trach placed 4/2. CXR 4/3 no active disease. No evidence of pneumonia or pulmonary edema. Per chart pt smokes 1 pack/day, consumes 2-3 forty oz beers daily, + marijuana.      SLP Plan  Continue with current plan of care       Recommendations  Diet recommendations: Dysphagia 1 (puree);Thin liquid Liquids provided via: Straw Medication Administration: Crushed with puree Supervision: Staff to assist with self feeding;Full supervision/cueing for compensatory strategies Compensations: Minimize environmental distractions Postural Changes and/or Swallow Maneuvers: Seated upright 90 degrees      Patient may use Passy-Muir Speech Valve: During all therapies with supervision;Intermittently with supervision PMSV Supervision: Full MD: Please consider changing trach tube to : Cuffless         Oral Care Recommendations: Oral care QID Follow up Recommendations: Skilled Nursing facility SLP Visit Diagnosis: Dysphagia, unspecified (R13.10) Plan: Continue with current plan of care       GO                Houston Siren 03/31/2020, 5:00 PM   [name] Colvin Caroli.Ed Risk analyst [phone] Office [phone]

## 2020-03-31 NOTE — Progress Notes (Addendum)
Patient refuses to let me clean his trache.  He was swinging his arms and spitting.  He appears angry and doesn't want to be touched.  I explained that he needed to be suctioned and attempted to remove his dirty gauze but he used his milted hand to push my hand away.  Vital signs remain stable.

## 2020-03-31 NOTE — Progress Notes (Signed)
PROGRESS NOTE  Kenneth Sullivan ZOX:[id number] DOB: [birth year] DOA: 03/07/2020 PCP: Medicine, [name]   LOS: 24 days   Brief Narrative / Interim history: 64 year old with no significant past medical history came into the hospital with shortness of breath, was hypoxic, had a witnessed bradycardic event lead into asystole/cardiac arrest.  Was found to have significantly massive PE/DVT and RV strain, also was found to have an acute GI bleed from a gastric ulcer.  He underwent IR guided mechanical thrombectomy, IVC filter and GDA embolization.  Also underwent an EGD which showed oozing gastric ulcer with clot.  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  Currently has a trach in place and a PEG tube placement is pending, there is a Producer, television/film/video apparently and IR is waiting for shipment.  TOC on board for LTAC versus SNF placement.  Subjective / 24h Interval events: Seems agitated this morning, was able to reposition himself on his belly and refuses to be placed back.  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  MRI of the brain showed multiple acute to subacute infarcts involving bilateral cerebral hemisphere consistent with global hypoperfusion related to cardiac arrest.  Underwent IR guided mechanical thrombectomy, IVC filter.  MRI also showed evidence of hemorrhagic conversion of his infarcts. Overall poor prognosis, palliative care consulted, he is DNR but we are to continue aggressive medical management including PEG tube as well as placement.   Awaiting IR decision for PEG tube placement  Active Problems Acute respiratory failure with hypoxia-due to PE, he required intubation and eventually ended up having trach on 4/5.  Currently on trach collar but continues to have copious and thick thick secretions, he is getting scopolamine patch.  Afebrile, minimal WBC, continue to monitor clinically.  PCCM recommends #4 cuffless trach once stoma is more mature, with at least 14 days.  Patient had an event on 4/11 was found on the floor and trach was dislodged.  Respiratory status is stable  Tracheobronchitis-tracheal aspirate sent for cultures, currently on doxycycline and steroids started on 4/10, plan for probably 7 total days, today day 6  Acute blood loss anemia due to acute GI bleed secondary to large gastric ulcer-GI consulted, underwent an EGD and eventually requiring GDA embolization.  Continue PPI.  Hemoglobin remained stable  Toothache-patient does complain of severe tooth ache, history is difficult to obtain due to tracheostomy status, have obtained a CT scan of the maxillofacial which showed chronic left TMJ degeneration without superimposed acute abnormalities or acute inflammatory processes.  Symptomatic management  Agitation/ICU delirium-as needed Haldol/Ativan  Dysphagia -speech evaluated, currently on dysphagia 1 with thin liquids however unlikely that he would meet his nutritional needs.  PEG tube placement pending, apparently there is a Producer, television/film/video and IR will place one when stock will be renewed.  Discussed with nutrition today, resume core track tube feeds pending PEG since he is not eating any p.o.  Persistent hypokalemia -replete again this morning   Scheduled Meds: . chlorhexidine gluconate (MEDLINE KIT)  15 mL Mouth Rinse BID  . Chlorhexidine Gluconate Cloth  6 each Topical Q0600  . feeding supplement (PRO-STAT SUGAR FREE 64)  30  mL Per Tube Daily  . free water  200 mL Per Tube Q6H  . mouth rinse  15 mL Mouth  Rinse 10 times per day  . methylPREDNISolone (SOLU-MEDROL) injection  40 mg Intravenous Q12H  . pantoprazole  40 mg Intravenous Q12H  . potassium chloride  40 mEq Per Tube Once  . QUEtiapine  12.5 mg Per Tube QHS  . scopolamine  1 patch Transdermal Q72H  . sodium chloride flush  10-40 mL Intracatheter Q12H  . sodium chloride flush  10-40 mL Intracatheter Q12H   Continuous Infusions: . sodium chloride    . dextrose 50 mL/hr at 03/29/20 0205  . doxycycline (VIBRAMYCIN) IV 100 mg (03/31/20 0922)  . feeding supplement (JEVITY 1.2 CAL) 75 mL/hr at 03/25/20 1230   PRN Meds:.sodium chloride, acetaminophen, dextrose, docusate sodium, gelatin adsorbable, guaiFENesin-dextromethorphan, hydrALAZINE, ipratropium-albuterol, LORazepam, metoprolol tartrate, sodium chloride flush  DVT prophylaxis: SCDs Code Status: DNR Family Communication: Called niece and discussed with her daughter Patient admitted from: home Anticipated d/c place: SNF vs LTACH  Barriers to d/c: Awaiting PEG tube placement as well as bed  Consultants:  PCCM IR Palliative care  Procedures:  EGD GDA embolization Tracheostomy 4/5  Microbiology  Respiratory culture 4/10-normal respiratory flora  Antimicrobials: Doxycycline 4/10 >>   Objective: Vitals:   03/31/20 0648 03/31/20 0747 03/31/20 0800 03/31/20 1057  BP:      Pulse: 88 83 83 80  Resp: 19 (!) 21 20 (!) 22  Temp:      TempSrc:      SpO2: 96% 92% 92% 94%  Weight:      Height:        Intake/Output Summary (Last 24 hours) at 03/31/2020 1115 Last data filed at 03/31/2020 0449 Gross per 24 hour  Intake 4296.59 ml  Output 3000 ml  Net 1296.59 ml   Filed Weights   03/23/20 0342 03/25/20 0500 03/27/20 0500  Weight: 77.8 kg 77.7 kg 70.3 kg    Examination:  Constitutional: He is a bit agitated Eyes: No scleral icterus ENMT: Moist mucous membranes Neck: normal, supple Respiratory: Coarse breath sounds bilaterally, no wheezing Cardiovascular: Regular  rate and rhythm, no edema Abdomen: Deferred as he was laying on his belly Musculoskeletal: no clubbing / cyanosis.  Skin: No rashes Neurologic: Moves all 4 independently without focal deficits   Data Reviewed: I have independently reviewed following labs and imaging studies   CBC: Recent Labs  Lab 03/27/20 1515 03/28/20 0815 03/30/20 0539  WBC 13.8* 13.2* 11.3*  NEUTROABS 11.4*  --   --   HGB 9.9* 10.2* 10.7*  HCT 30.4* 31.9* 32.2*  MCV 95.3 97.0 91.7  PLT 436* 391 811*   Basic Metabolic Panel: Recent Labs  Lab 03/24/20 2054 03/24/20 2054 03/25/20 1114 03/25/20 1114 03/25/20 1639 03/26/20 1745 03/27/20 0759 03/27/20 0759 03/28/20 0606 03/28/20 0606 03/28/20 1708 03/29/20 0510 03/29/20 1637 03/30/20 0539 03/31/20 0727  NA  --   --  137  --   --   --  140  --  136  --   --   --   --  140 139  K  --   --  3.2*  --   --   --  5.0  --  5.1  --   --   --   --  3.3* 3.7  CL  --   --  100  --   --   --  104  --  103  --   --   --   --  103 101  CO2  --   --  26  --   --   --  20*  --  21*  --   --   --   --  28 25  GLUCOSE  --   --  93  --   --   --  112*  --  83  --   --   --   --  110* 117*  BUN  --   --  <5*  --   --   --  7*  --  9  --   --   --   --  6* 7*  CREATININE  --   --  0.74  --   --   --  0.69  --  0.76  --   --   --   --  0.78 0.74  CALCIUM  --   --  8.4*  --   --   --  9.3  --  8.5*  --   --   --   --  8.8* 8.8*  MG 1.7  --  1.8  --  2.0  --   --   --   --   --   --   --   --  2.0  --   PHOS 2.6   < > 2.9   < > 2.7   < > 3.3   < > 3.8   < > 3.6 3.8 3.2 3.3 3.8   < > = values in this interval not displayed.   Liver Function Tests: Recent Labs  Lab 03/28/20 0606 03/30/20 0539  AST 58* 26  ALT 12 20  ALKPHOS 73 71  BILITOT 1.6* 0.9  PROT 6.1* 6.4*  ALBUMIN 2.8* 2.5*   Coagulation Profile: No results for input(s): INR, PROTIME in the last 168 hours. HbA1C: No results for input(s): HGBA1C in the last 72 hours. CBG: Recent Labs  Lab  03/30/20 1522 03/30/20 1915 03/30/20 2323 03/31/20 0429 03/31/20 0728  GLUCAP 110* 92 108* 111* 110*    Recent Results (from the past 240 hour(s))  Culture, respiratory (non-expectorated)     Status: None   Collection Time: 03/25/20 12:02 PM   Specimen: Tracheal Aspirate; Respiratory  Result Value Ref Range Status   Specimen Description TRACHEAL ASPIRATE  Final   Special Requests NONE  Final   Gram Stain   Final    RARE WBC PRESENT, PREDOMINANTLY MONONUCLEAR FEW GRAM POSITIVE COCCI IN PAIRS FEW GRAM NEGATIVE RODS    Culture   Final    Consistent with normal respiratory flora. Performed at [facility], [place] [address], [place], [place] [address]    Report Status 03/28/2020 FINAL  Final  Culture, blood (Routine X 2) w Reflex to ID Panel     Status: None   Collection Time: 03/25/20  4:43 PM   Specimen: BLOOD  Result Value Ref Range Status   Specimen Description BLOOD BLOOD LEFT WRIST  Final   Special Requests   Final    BOTTLES DRAWN AEROBIC ONLY Blood Culture adequate volume   Culture   Final    NO GROWTH 5 DAYS Performed at [facility], [place] [address], [place],  [address]    Report Status 03/30/2020 FINAL  Final     Radiology Studies: No results found. Marzetta Board, MD, PhD [facility]  Between 7 am - 7 pm I am available, please contact me via Amion or  Securechat  Between 7 pm - 7 am I am not available, please contact night coverage MD/APP via Amion

## 2020-03-31 NOTE — Procedures (Signed)
Cortrak  Person Inserting Tube:  Shanna Un, RD Tube Type:  Cortrak - 43 inches Tube Location:  Left nare Initial Placement:  Stomach Secured by: Bridle Technique Used to Measure Tube Placement:  Documented cm marking at nare/ corner of mouth Cortrak Secured At:  70 cm    No x-ray is required. RN may begin using tube.   If the tube becomes dislodged please keep the tube and contact the Cortrak team at [email or web address] (password [email or web address]) for replacement.  If after hours and replacement cannot be delayed, place a NG tube and confirm placement with an abdominal x-ray.    Rhiley Tarver RD, LDN Clinical Nutrition Pager listed in AMION    

## 2020-03-31 NOTE — Progress Notes (Signed)
Notified by central monitoring of elevated heart rate. Went to room to assess patient. HR > 110.  Administered Metoprolol as ordered. HR now <100.

## 2020-04-01 DIAGNOSIS — I63429 Cerebral infarction due to embolism of unspecified anterior cerebral artery: Secondary | ICD-10-CM | POA: Diagnosis not present

## 2020-04-01 DIAGNOSIS — I469 Cardiac arrest, cause unspecified: Secondary | ICD-10-CM | POA: Diagnosis not present

## 2020-04-01 DIAGNOSIS — G931 Anoxic brain damage, not elsewhere classified: Secondary | ICD-10-CM | POA: Diagnosis not present

## 2020-04-01 DIAGNOSIS — J9601 Acute respiratory failure with hypoxia: Secondary | ICD-10-CM | POA: Diagnosis not present

## 2020-04-01 LAB — GLUCOSE, CAPILLARY
Glucose-Capillary: 107 mg/dL — ABNORMAL HIGH (ref 70–99)
Glucose-Capillary: 110 mg/dL — ABNORMAL HIGH (ref 70–99)
Glucose-Capillary: 120 mg/dL — ABNORMAL HIGH (ref 70–99)
Glucose-Capillary: 120 mg/dL — ABNORMAL HIGH (ref 70–99)
Glucose-Capillary: 123 mg/dL — ABNORMAL HIGH (ref 70–99)
Glucose-Capillary: 127 mg/dL — ABNORMAL HIGH (ref 70–99)
Glucose-Capillary: 139 mg/dL — ABNORMAL HIGH (ref 70–99)

## 2020-04-01 LAB — PHOSPHORUS: Phosphorus: 3.6 mg/dL (ref 2.5–4.6)

## 2020-04-01 NOTE — Progress Notes (Signed)
PROGRESS NOTE  Kenneth Sullivan [id number] DOB: Jan 08, 1956 DOA: 03/07/2020 PCP: Medicine, [name]   LOS: 25 days   Brief Narrative / Interim history: 64 year old with no significant past medical history came into the hospital with shortness of breath, was hypoxic, had a witnessed bradycardic event lead into asystole/cardiac arrest.  Was found to have significantly massive PE/DVT and RV strain, also was found to have an acute GI bleed from a gastric ulcer.  He underwent IR guided mechanical thrombectomy, IVC filter and GDA embolization.  Also underwent an EGD which showed oozing gastric ulcer with clot.  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  Currently has a trach in place and a PEG tube placement is pending, there is a Producer, television/film/video apparently and IR is waiting for shipment.  TOC on board for LTAC versus SNF placement.  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  MRI of the brain showed multiple acute to subacute infarcts involving bilateral cerebral hemisphere consistent with global hypoperfusion related to cardiac arrest.  Underwent IR guided mechanical thrombectomy, IVC filter.  MRI also showed evidence of hemorrhagic conversion of his infarcts. Overall poor prognosis, palliative care consulted, he is DNR but we are to continue aggressive medical management including PEG tube as well as placement.  Waiting on IR since PEG is on national back order  apparently  Active Problems Acute respiratory failure with hypoxia-due to PE, he required intubation and eventually ended up having trach on 4/5.  Currently on trach collar but continues to have copious and thick thick secretions, he is getting scopolamine patch.  Afebrile, minimal WBC, continue to monitor clinically.  PCCM recommends #4 cuffless trach once stoma is more mature, with at least 14 days.  Patient had an event on 4/11 was found on the floor and trach was dislodged.  Respiratory status is currently stable, will touch base with PCCM on 4/19 when there will be 14 days   Tracheobronchitis-tracheal aspirate sent for cultures, currently on doxycycline and steroids started on 4/10, plan for probably 7 total days, today day 7 and can stop antibiotics  Acute blood loss anemia due to acute GI bleed secondary to large gastric ulcer-GI consulted, underwent an EGD and eventually requiring GDA embolization.  Continue PPI.  Hemoglobin remained stable  Toothache-patient does complain of severe tooth ache, history is difficult to obtain due to tracheostomy status, have obtained a CT scan of the maxillofacial which showed chronic left TMJ degeneration without superimposed acute abnormalities or acute inflammatory processes.  Symptomatic management  Agitation/ICU delirium-as needed Haldol/Ativan  Dysphagia -speech evaluated, currently on dysphagia 1 with thin liquids however unlikely that he would meet his nutritional needs.  PEG tube placement pending, apparently there is a Producer, television/film/video and IR will place one when stock will be renewed.  Discussed with nutrition today, resume core track tube feeds pending PEG since he is not eating any p.o.  Persistent hypokalemia -replete again this morning   Scheduled Meds: . chlorhexidine gluconate (MEDLINE KIT)  15 mL Mouth Rinse BID  . Chlorhexidine Gluconate Cloth  6 each  Topical Q0600  . feeding supplement (PRO-STAT SUGAR FREE 64)  30 mL Per Tube Daily  .  free water  200 mL Per Tube Q6H  . mouth rinse  15 mL Mouth Rinse 10 times per day  . methylPREDNISolone (SOLU-MEDROL) injection  40 mg Intravenous Q12H  . pantoprazole  40 mg Intravenous Q12H  . potassium chloride  40 mEq Per Tube Once  . QUEtiapine  12.5 mg Per Tube QHS  . scopolamine  1 patch Transdermal Q72H  . sodium chloride flush  10-40 mL Intracatheter Q12H  . sodium chloride flush  10-40 mL Intracatheter Q12H   Continuous Infusions: . sodium chloride    . dextrose 50 mL/hr at 03/29/20 0205  . doxycycline (VIBRAMYCIN) IV 100 mg (04/01/20 0921)  . feeding supplement (JEVITY 1.2 CAL) 1,000 mL (03/31/20 2008)   PRN Meds:.sodium chloride, acetaminophen, dextrose, docusate sodium, gelatin adsorbable, guaiFENesin-dextromethorphan, hydrALAZINE, ipratropium-albuterol, LORazepam, metoprolol tartrate, sodium chloride flush  DVT prophylaxis: SCDs Code Status: DNR Family Communication: Called niece and discussed with her daughter Patient admitted from: home Anticipated d/c place: SNF vs LTACH  Barriers to d/c: PEG tube placement and TOC for disposition  Consultants:  PCCM IR Palliative care  Procedures:  EGD GDA embolization Tracheostomy 4/5  Microbiology  Respiratory culture 4/10-normal respiratory flora  Antimicrobials: Doxycycline 4/10 >>   Objective: Vitals:   04/01/20 0052 04/01/20 0300 04/01/20 0744 04/01/20 0806  BP:  128/80 112/80   Pulse: 86 85 97 85  Resp: 20 20 (!) 21 (!) 23  Temp:  97.8 F (36.6 C)    TempSrc:  Oral    SpO2: 98% 99%  100%  Weight:      Height:        Intake/Output Summary (Last 24 hours) at 04/01/2020 1030 Last data filed at 03/31/2020 2300 Gross per 24 hour  Intake 10 ml  Output 1450 ml  Net -1440 ml   Filed Weights   03/23/20 0342 03/25/20 0500 03/27/20 0500  Weight: 77.8 kg 77.7 kg 70.3 kg    Examination:  Constitutional: No distress Eyes: No icterus ENMT: Moist mucous membranes Neck: normal, supple Respiratory:  Coarse breath sounds bilaterally without wheezing Cardiovascular: Regular rate and rhythm, no edema Abdomen: Soft, nontender, bowel sounds positive Musculoskeletal: no clubbing / cyanosis.  Skin: No rashes seen Neurologic: Nonfocal   Data Reviewed: I have independently reviewed following labs and imaging studies   CBC: Recent Labs  Lab 03/27/20 1515 03/28/20 0815 03/30/20 0539  WBC 13.8* 13.2* 11.3*  NEUTROABS 11.4*  --   --   HGB 9.9* 10.2* 10.7*  HCT 30.4* 31.9* 32.2*  MCV 95.3 97.0 91.7  PLT 436* 391 096*   Basic Metabolic Panel: Recent Labs  Lab 03/25/20 1114 03/25/20 1114 03/25/20 1639 03/26/20 1745 03/27/20 0759 03/27/20 0759 03/28/20 0606 03/28/20 1708 03/29/20 1637 03/30/20 0539 03/31/20 0727 03/31/20 1630 04/01/20 0621  NA 137  --   --   --  140  --  136  --   --  140 139  --   --   K 3.2*  --   --   --  5.0  --  5.1  --   --  3.3* 3.7  --   --   CL 100  --   --   --  104  --  103  --   --  103 101  --   --   CO2 26  --   --   --  20*  --  21*  --   --  28 25  --   --   GLUCOSE 93  --   --   --  112*  --  83  --   --  110* 117*  --   --   BUN <5*  --   --   --  7*  --  9  --   --  6* 7*  --   --   CREATININE 0.74  --   --   --  0.69  --  0.76  --   --  0.78 0.74  --   --   CALCIUM 8.4*  --   --   --  9.3  --  8.5*  --   --  8.8* 8.8*  --   --   MG 1.8  --  2.0  --   --   --   --   --   --  2.0  --   --   --   PHOS 2.9   < > 2.7   < > 3.3   < > 3.8   < > 3.2 3.3 3.8 3.6 3.6   < > = values in this interval not displayed.   Liver Function Tests: Recent Labs  Lab 03/28/20 0606 03/30/20 0539  AST 58* 26  ALT 12 20  ALKPHOS 73 71  BILITOT 1.6* 0.9  PROT 6.1* 6.4*  ALBUMIN 2.8* 2.5*   Coagulation Profile: No results for input(s): INR, PROTIME in the last 168 hours. HbA1C: No results for input(s): HGBA1C in the last 72 hours. CBG: Recent Labs  Lab 03/31/20 1703 03/31/20 2040 04/01/20 0008 04/01/20 0431 04/01/20 0911  GLUCAP 137* 141* 120*  110* 123*    Recent Results (from the past 240 hour(s))  Culture, respiratory (non-expectorated)     Status: None   Collection Time: 03/25/20 12:02 PM   Specimen: Tracheal Aspirate; Respiratory  Result Value Ref Range Status   Specimen Description TRACHEAL ASPIRATE  Final   Special Requests NONE  Final   Gram Stain   Final    RARE WBC PRESENT, PREDOMINANTLY MONONUCLEAR FEW GRAM POSITIVE COCCI IN PAIRS FEW GRAM NEGATIVE RODS    Culture   Final    Consistent with normal respiratory flora. Performed at [facility], [place] [address], [place], [place] [address]    Report Status 03/28/2020 FINAL  Final  Culture, blood (Routine X 2) w Reflex to ID Panel     Status: None   Collection Time: 03/25/20  4:43 PM   Specimen: BLOOD  Result Value Ref Range Status   Specimen Description BLOOD BLOOD LEFT WRIST  Final   Special Requests   Final    BOTTLES DRAWN AEROBIC ONLY Blood Culture adequate volume   Culture   Final    NO GROWTH 5 DAYS Performed at [facility], [place] [address] [place], [place] [address]    Report Status 03/30/2020 FINAL  Final     Radiology Studies: No results found. Marzetta Board, MD, PhD [facility]  Between 7 am - 7 pm I am available, please contact me via Amion or Securechat  Between 7 pm - 7 am I am not available, please contact night coverage MD/APP via Amion

## 2020-04-02 DIAGNOSIS — I469 Cardiac arrest, cause unspecified: Secondary | ICD-10-CM | POA: Diagnosis not present

## 2020-04-02 DIAGNOSIS — J9601 Acute respiratory failure with hypoxia: Secondary | ICD-10-CM | POA: Diagnosis not present

## 2020-04-02 LAB — PHOSPHORUS: Phosphorus: 3.3 mg/dL (ref 2.5–4.6)

## 2020-04-02 LAB — CBC
HCT: 35.4 % — ABNORMAL LOW (ref 39.0–52.0)
Hemoglobin: 11.8 g/dL — ABNORMAL LOW (ref 13.0–17.0)
MCH: 30.7 pg (ref 26.0–34.0)
MCHC: 33.3 g/dL (ref 30.0–36.0)
MCV: 92.2 fL (ref 80.0–100.0)
Platelets: 381 10*3/uL (ref 150–400)
RBC: 3.84 MIL/uL — ABNORMAL LOW (ref 4.22–5.81)
RDW: 16.6 % — ABNORMAL HIGH (ref 11.5–15.5)
WBC: 15.6 10*3/uL — ABNORMAL HIGH (ref 4.0–10.5)
nRBC: 0 % (ref 0.0–0.2)

## 2020-04-02 LAB — COMPREHENSIVE METABOLIC PANEL
ALT: 22 U/L (ref 0–44)
AST: 23 U/L (ref 15–41)
Albumin: 2.5 g/dL — ABNORMAL LOW (ref 3.5–5.0)
Alkaline Phosphatase: 65 U/L (ref 38–126)
Anion gap: 9 (ref 5–15)
BUN: 13 mg/dL (ref 8–23)
CO2: 28 mmol/L (ref 22–32)
Calcium: 8.6 mg/dL — ABNORMAL LOW (ref 8.9–10.3)
Chloride: 102 mmol/L (ref 98–111)
Creatinine, Ser: 0.71 mg/dL (ref 0.61–1.24)
GFR calc Af Amer: >60 mL/min (ref >60)
GFR calc non Af Amer: >60 mL/min (ref >60)
Glucose, Bld: 108 mg/dL — ABNORMAL HIGH (ref 70–99)
Potassium: 4.1 mmol/L (ref 3.5–5.1)
Sodium: 139 mmol/L (ref 135–145)
Total Bilirubin: 0.5 mg/dL (ref 0.3–1.2)
Total Protein: 5.7 g/dL — ABNORMAL LOW (ref 6.5–8.1)

## 2020-04-02 LAB — GLUCOSE, CAPILLARY
Glucose-Capillary: 143 mg/dL — ABNORMAL HIGH (ref 70–99)
Glucose-Capillary: 97 mg/dL (ref 70–99)
Glucose-Capillary: 114 mg/dL — ABNORMAL HIGH (ref 70–99)
Glucose-Capillary: 84 mg/dL (ref 70–99)
Glucose-Capillary: 115 mg/dL — ABNORMAL HIGH (ref 70–99)

## 2020-04-02 MED ORDER — HALOPERIDOL LACTATE 5 MG/ML IJ SOLN
2.0000 mg | Freq: Four times a day (QID) | INTRAMUSCULAR | Status: DC | PRN
  Administered 2020-04-02 – 2020-04-20 (×18): 2 mg via INTRAVENOUS
  Filled 2020-04-02 (×20): qty 1

## 2020-04-02 NOTE — Progress Notes (Signed)
Patient refused morning labs. Exavier Lina RN

## 2020-04-02 NOTE — Progress Notes (Signed)
PROGRESS NOTE  Kenneth Sullivan [id number]:[id number] DOB: Dec 19, 1955 DOA: 03/07/2020 PCP: Medicine, [name]   LOS: 26 days   Brief Narrative / Interim history: 64 year old with no significant past medical history came into the hospital with shortness of breath, was hypoxic, had a witnessed bradycardic event lead into asystole/cardiac arrest.  Was found to have significantly massive PE/DVT and RV strain, also was found to have an acute GI bleed from a gastric ulcer.  He underwent IR guided mechanical thrombectomy, IVC filter and GDA embolization.  Also underwent an EGD which showed oozing gastric ulcer with clot.  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  Currently has a trach in place and a PEG tube placement is pending, there is a Producer, television/film/video apparently and IR is waiting for shipment.  TOC on board for LTAC versus SNF placement.  Subjective / 24h Interval events: Alert, confused, mittens on.  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  MRI of the brain showed multiple acute to subacute infarcts involving bilateral cerebral hemisphere consistent with global hypoperfusion related to cardiac arrest.  Underwent IR guided mechanical thrombectomy, IVC filter.  MRI also showed evidence of hemorrhagic conversion of his infarcts. Overall poor prognosis, palliative care consulted, he is DNR but we are to continue aggressive medical management including PEG tube as well as placement.  Waiting on IR since PEG is on  national back order apparently.  Will touch base with them again on Monday  Active Problems Acute respiratory failure with hypoxia-due to PE, he required intubation and eventually ended up having trach on 4/5.  Currently on trach collar but continues to have copious and thick thick secretions, he is getting scopolamine patch.  Afebrile, minimal WBC, continue to monitor clinically.  PCCM recommends #4 cuffless trach once stoma is more mature, with at least 14 days.  Patient had an event on 4/11 was found on the floor and trach was dislodged.  Respiratory status is currently stable, will touch base with PCCM on 4/19 when there will be 14 days   Tracheobronchitis-tracheal aspirate sent for cultures, currently on doxycycline and steroids started on 4/10, plan for probably 7 total days, today day 7 and can stop antibiotics  Acute blood loss anemia due to acute GI bleed secondary to large gastric ulcer-GI consulted, underwent an EGD and eventually requiring GDA embolization.  Continue PPI.  Hemoglobin remained stable  Toothache-patient does complain of severe tooth ache, history is difficult to obtain due to tracheostomy status, have obtained a CT scan of the maxillofacial which showed chronic left TMJ degeneration without superimposed acute abnormalities or acute inflammatory processes.  Symptomatic management  Agitation/ICU delirium-as needed Haldol/Ativan  Dysphagia -speech evaluated, currently on dysphagia 1 with thin liquids however unlikely that he would meet his nutritional needs.  PEG tube placement pending, apparently there is a Producer, television/film/video and IR will place one when stock will be renewed.  Discussed with nutrition today, resume core track tube feeds pending PEG since he is not eating any p.o.  Persistent hypokalemia -refused labs this morning   Scheduled Meds: . chlorhexidine gluconate (MEDLINE KIT)  15 mL Mouth Rinse BID  . Chlorhexidine Gluconate Cloth  6 each Topical Q0600  .  feeding supplement (PRO-STAT SUGAR FREE 64)  30 mL Per Tube Daily  . free water  200 mL Per Tube Q6H  . mouth rinse  15 mL Mouth Rinse 10 times per day  . methylPREDNISolone (SOLU-MEDROL) injection  40 mg Intravenous Q12H  . pantoprazole  40 mg Intravenous Q12H  . potassium chloride  40 mEq Per Tube Once  . QUEtiapine  12.5 mg Per Tube QHS  . scopolamine  1 patch Transdermal Q72H  . sodium chloride flush  10-40 mL Intracatheter Q12H  . sodium chloride flush  10-40 mL Intracatheter Q12H   Continuous Infusions: . sodium chloride    . dextrose 50 mL/hr at 03/29/20 0205  . feeding supplement (JEVITY 1.2 CAL) 1,000 mL (03/31/20 2008)   PRN Meds:.sodium chloride, acetaminophen, dextrose, docusate sodium, gelatin adsorbable, guaiFENesin-dextromethorphan, hydrALAZINE, ipratropium-albuterol, LORazepam, metoprolol tartrate, sodium chloride flush  DVT prophylaxis: SCDs Code Status: DNR Family Communication: Called niece and discussed with her daughter on 4/16 Patient admitted from: home Anticipated d/c place: SNF vs LTACH  Barriers to d/c: PEG tube placement and TOC for disposition  Consultants:  PCCM IR Palliative care  Procedures:  EGD GDA embolization Tracheostomy 4/5  Microbiology  Respiratory culture 4/10-normal respiratory flora  Antimicrobials: Doxycycline 4/10 >> 4/16   Objective: Vitals:   04/02/20 0437 04/02/20 0514 04/02/20 0847 04/02/20 0916  BP: 116/83 127/89 (!) 132/91   Pulse: 81  (!) 105 (!) 129  Resp:  18 (!) 25   Temp:  98.6 F (37 C) 98.2 F (36.8 C)   TempSrc:  Oral Oral   SpO2: 100% 100% 100%   Weight:      Height:        Intake/Output Summary (Last 24 hours) at 04/02/2020 1120 Last data filed at 04/02/2020 1104 Gross per 24 hour  Intake --  Output 4350 ml  Net -4350 ml   Filed Weights   03/23/20 0342 03/25/20 0500 03/27/20 0500  Weight: 77.8 kg 77.7 kg 70.3 kg    Examination:  Constitutional: NAD Respiratory: Coarse bilaterally, no  wheezing Cardiovascular: Regular rate and rhythm, no edema Abdomen: Soft, nontender, bowel sounds positive   Data Reviewed: I have independently reviewed following labs and imaging studies   CBC: Recent Labs  Lab 03/27/20 1515 03/28/20 0815 03/30/20 0539  WBC 13.8* 13.2* 11.3*  NEUTROABS 11.4*  --   --   HGB 9.9* 10.2* 10.7*  HCT 30.4* 31.9* 32.2*  MCV 95.3 97.0 91.7  PLT 436* 391 476*   Basic Metabolic Panel: Recent Labs  Lab 03/27/20 0759 03/27/20 0759 03/28/20 0606 03/28/20 1708 03/29/20 1637 03/30/20 0539 03/31/20 0727 03/31/20 1630 04/01/20 0621  NA 140  --  136  --   --  140 139  --   --   K 5.0  --  5.1  --   --  3.3* 3.7  --   --   CL 104  --  103  --   --  103 101  --   --   CO2 20*  --  21*  --   --  28 25  --   --   GLUCOSE 112*  --  83  --   --  110* 117*  --   --   BUN 7*  --  9  --   --  6* 7*  --   --   CREATININE 0.69  --  0.76  --   --  0.78 0.74  --   --   CALCIUM 9.3  --  8.5*  --   --  8.8* 8.8*  --   --   MG  --   --   --   --   --  2.0  --   --   --   PHOS 3.3   < > 3.8   < > 3.2 3.3 3.8 3.6 3.6   < > = values in this interval not displayed.   Liver Function Tests: Recent Labs  Lab 03/28/20 0606 03/30/20 0539  AST 58* 26  ALT 12 20  ALKPHOS 73 71  BILITOT 1.6* 0.9  PROT 6.1* 6.4*  ALBUMIN 2.8* 2.5*   Coagulation Profile: No results for input(s): INR, PROTIME in the last 168 hours. HbA1C: No results for input(s): HGBA1C in the last 72 hours. CBG: Recent Labs  Lab 04/01/20 1758 04/01/20 2015 04/01/20 2347 04/02/20 0318 04/02/20 0843  GLUCAP 120* 107* 139* 143* 97    Recent Results (from the past 240 hour(s))  Culture, respiratory (non-expectorated)     Status: None   Collection Time: 03/25/20 12:02 PM   Specimen: Tracheal Aspirate; Respiratory  Result Value Ref Range Status   Specimen Description TRACHEAL ASPIRATE  Final   Special Requests NONE  Final   Gram Stain   Final    RARE WBC PRESENT, PREDOMINANTLY  MONONUCLEAR FEW GRAM POSITIVE COCCI IN PAIRS FEW GRAM NEGATIVE RODS    Culture   Final    Consistent with normal respiratory flora. Performed at [facility], [place] [address] [place], [place] [address]    Report Status 03/28/2020 FINAL  Final  Culture, blood (Routine X 2) w Reflex to ID Panel     Status: None   Collection Time: 03/25/20  4:43 PM   Specimen: BLOOD  Result Value Ref Range Status   Specimen Description BLOOD BLOOD LEFT WRIST  Final   Special Requests   Final    BOTTLES DRAWN AEROBIC ONLY Blood Culture adequate volume   Culture   Final    NO GROWTH 5 DAYS Performed at [facility], [place] [address], [place], [place] [address]    Report Status 03/30/2020 FINAL  Final     Radiology Studies: No results found. Marzetta Board, MD, PhD [facility]  Between 7 am - 7 pm I am available, please contact me via Amion or Securechat  Between 7 pm - 7 am I am not available, please contact night coverage MD/APP via Amion

## 2020-04-03 DIAGNOSIS — I63429 Cerebral infarction due to embolism of unspecified anterior cerebral artery: Secondary | ICD-10-CM | POA: Diagnosis not present

## 2020-04-03 DIAGNOSIS — I469 Cardiac arrest, cause unspecified: Secondary | ICD-10-CM | POA: Diagnosis not present

## 2020-04-03 DIAGNOSIS — G931 Anoxic brain damage, not elsewhere classified: Secondary | ICD-10-CM | POA: Diagnosis not present

## 2020-04-03 DIAGNOSIS — J9601 Acute respiratory failure with hypoxia: Secondary | ICD-10-CM | POA: Diagnosis not present

## 2020-04-03 LAB — CBC
HCT: 37.6 % — ABNORMAL LOW (ref 39.0–52.0)
Hemoglobin: 12.5 g/dL — ABNORMAL LOW (ref 13.0–17.0)
MCH: 30.9 pg (ref 26.0–34.0)
MCHC: 33.2 g/dL (ref 30.0–36.0)
MCV: 92.8 fL (ref 80.0–100.0)
Platelets: 391 10*3/uL (ref 150–400)
RBC: 4.05 MIL/uL — ABNORMAL LOW (ref 4.22–5.81)
RDW: 17 % — ABNORMAL HIGH (ref 11.5–15.5)
WBC: 18.6 10*3/uL — ABNORMAL HIGH (ref 4.0–10.5)
nRBC: 0 % (ref 0.0–0.2)

## 2020-04-03 LAB — GLUCOSE, CAPILLARY
Glucose-Capillary: 108 mg/dL — ABNORMAL HIGH (ref 70–99)
Glucose-Capillary: 116 mg/dL — ABNORMAL HIGH (ref 70–99)
Glucose-Capillary: 124 mg/dL — ABNORMAL HIGH (ref 70–99)
Glucose-Capillary: 129 mg/dL — ABNORMAL HIGH (ref 70–99)
Glucose-Capillary: 136 mg/dL — ABNORMAL HIGH (ref 70–99)
Glucose-Capillary: 87 mg/dL (ref 70–99)
Glucose-Capillary: 96 mg/dL (ref 70–99)

## 2020-04-03 LAB — BASIC METABOLIC PANEL
Anion gap: 11 (ref 5–15)
BUN: 13 mg/dL (ref 8–23)
CO2: 27 mmol/L (ref 22–32)
Calcium: 9.3 mg/dL (ref 8.9–10.3)
Chloride: 100 mmol/L (ref 98–111)
Creatinine, Ser: 0.66 mg/dL (ref 0.61–1.24)
GFR calc Af Amer: >60 mL/min (ref >60)
GFR calc non Af Amer: >60 mL/min (ref >60)
Glucose, Bld: 119 mg/dL — ABNORMAL HIGH (ref 70–99)
Potassium: 5.4 mmol/L — ABNORMAL HIGH (ref 3.5–5.1)
Sodium: 138 mmol/L (ref 135–145)

## 2020-04-03 LAB — PHOSPHORUS: Phosphorus: 3.4 mg/dL (ref 2.5–4.6)

## 2020-04-03 NOTE — Progress Notes (Signed)
RT NOTES: Order to change patient's trach to a cuffless #6 Shiley. Unable to do at this time d/t patient swinging his arms and legs and spitting. RN aware. CCM Pete NP called and made aware of patient's behaviors hindering trach tube change.

## 2020-04-03 NOTE — Progress Notes (Addendum)
NAME:  Kenneth Sullivan, MRN:  [id number], DOB:  08-27-1956, LOS: 38 ADMISSION DATE:  03/07/2020, CONSULTATION DATE:  03/07/20 REFERRING MD:  Dr Roxanne Mins MD, CHIEF COMPLAINT:  Cardiac arrest  Brief History   64 year old with no significant past medical history.  Had acute shortness of breath with sats in the 40s on EMS arrival.  He had a witnessed bradycardic, asystolic arrest when EMS was present with 3 rounds of epi, 20 minutes to ROSC. Underwent normothermia protocol.  Work-up significant for massive PE, DVT with RV strain, GI bleed from gastric ulcer.  Past Medical History  Has not seen a doctor.  No past medical history. Smokes 1 pack/day, drinks 2 to 3 40 ounce beers every day.  Smokes marijuana.  Denies any other recreational drug use.  Significant Hospital Events   3/23 Admit, EGD with findings of oozing gastric ulcer with clot, IR for mechanical thrombectomy, IVC filter and GDA embolization 3/24 Started heparin drip  Consults:  Cardiology, GI, interventional radiology, neurolgoy  Procedures:  ETT 3/23 >>4/5 Trach 4/5 >> L TLC 3/23 >>  ALine L Fem 3/23 >> 3/30  Significant Diagnostic Tests:  CTA 3/23 >> bilateral PE with RV/with ratio 1.9, emphysema CT head 3/23 >> chronic microvascular changes.  No acute findings Echo 3/23 >> RV is severely dilated with reduced function and D-shaped septum, LVEF 60-65%. LE Venous Duplex 3/24 >> acute DVT in BLE up to the femoral vein  MRI Brain 3/28 >> multifocal acute to subacute ischemic infarcts involving bilateral cerebral hemispheres, findings consistent with global hypoperfusion event related to cardiac arrest, associated scattered petechial hemorrhage about many areas of ischemia, evidence of hemorrhagic conversion in the right parietal lobe  Micro Data:  Bcx 3/23 >> negative [name] 3/23 >> negative BCx2 3/23 >> negative   Antimicrobials:    Interim history/subjective:  Restless and agitated   Objective   Blood pressure  (Abnormal) 104/91, pulse (Abnormal) 112, temperature 98.5 F (36.9 C), temperature source Axillary, resp. rate (Abnormal) 22, height 6\' 2"  (1.88 m), weight 70.3 kg, SpO2 100 %.    FiO2 (%):  [28 %] 28 %   Intake/Output Summary (Last 24 hours) at 04/03/2020 0851 Last data filed at 04/02/2020 2135 Gross per 24 hour  Intake no documentation  Output 1800 ml  Net -1800 ml   Filed Weights   03/23/20 0342 03/25/20 0500 03/27/20 0500  Weight: 77.8 kg 77.7 kg 70.3 kg    Examination:  General this is a 64 year old aam he is restless and impulsive. Frequently trying to get OOB HENT #6 cuffed trach. Unremarkable  Pulm some scattered rhonchi. Currently on 28% ATC Card RRR  abd not tender. tol TFs Ext no sig edema Neuro agitated.   Resolved Hospital Problem list   Hypoglycemia Transaminitis  Assessment & Plan:   Tracheostomy dependent s/p Cardiac Arrest secondary to Massive PE s/p IVC filter Anoxic brain injury CVAs w/ hemorrhagic conversion Expressive aphasia  S/p massive UGI bleed requiring GDA embolization   Discussion  Continues to be impulsive and agitated at times. From a trach stand-point he is on minimal support. He is working w/ SLP w/ PMV and seems to be tolerating PMV. Also tolerating Dysphagia 1 diet w/ thin liquids.   Plan Will change to cuffless trach I'm not sure he will need trach long term. And given how impulsive he is it may be more problematic to keep it.  After change to cuffless we can re-assess. Would start w/ more prolonged intervals  on PMV  Will see again Thursday.   Erick Colace ACNP-BC [place] Pager # [phone] OR # [phone] if no answer

## 2020-04-03 NOTE — Progress Notes (Signed)
PROGRESS NOTE  Kenneth Sullivan DOB: 04/06/1956 DOA: 03/07/2020 PCP: Medicine, [name]   LOS: 27 days   Brief Narrative / Interim history: 64 year old with no significant past medical history came into the hospital with shortness of breath, was hypoxic, had a witnessed bradycardic event lead into asystole/cardiac arrest.  Was found to have significantly massive PE/DVT and RV strain, also was found to have an acute GI bleed from a gastric ulcer.  He underwent IR guided mechanical thrombectomy, IVC filter and GDA embolization.  Also underwent an EGD which showed oozing gastric ulcer with clot.  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  Currently has a trach in place and a PEG tube placement is pending, there is a Producer, television/film/video apparently and IR is waiting for shipment.  TOC on board for LTAC versus SNF placement.  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  MRI of the brain showed multiple acute to subacute infarcts involving bilateral cerebral hemisphere consistent with global hypoperfusion related to cardiac arrest.  Underwent IR guided mechanical thrombectomy, IVC filter.  MRI also showed evidence of hemorrhagic conversion of his infarcts. Overall poor prognosis, palliative care consulted, he is DNR but we are to continue aggressive medical management including PEG tube as well as placement.  Still  waiting on IR for PEG tube placement, currently is on backorder actually  Active Problems Acute respiratory failure with hypoxia-due to PE, he required intubation and eventually ended up having trach on 4/5.  Currently on trach collar but continues to have copious and thick thick secretions, he is getting scopolamine patch.  Afebrile, minimal WBC, continue to monitor clinically.  PCCM recommends #4 cuffless trach once stoma is more mature, with at least 14 days.  Patient had an event on 4/11 was found on the floor and trach was dislodged.  Respiratory status is stable, pulmonology reevaluated today and he is #6 cuffed trach has been changed to cuffless, continue PMV with speech  Tracheobronchitis-tracheal aspirate sent for cultures, it showed normal respiratory flora, he has completed 7 days of doxycycline with end date 4/17.  Remains afebrile, monitor clinically  Acute blood loss anemia due to acute GI bleed secondary to large gastric ulcer-GI consulted, underwent an EGD and eventually requiring GDA embolization.  Continue PPI.  Hemoglobin stable, monitor periodically  Toothache-patient complained of a severe tooth ache at 1 point, history is difficult to obtain due to tracheostomy status, have obtained a CT scan of the maxillofacial which showed chronic left TMJ degeneration without superimposed acute abnormalities or acute inflammatory processes.  Symptomatic management  Agitation/ICU delirium-as needed Haldol/Ativan  Dysphagia -speech evaluated, currently on dysphagia 1 with thin liquids however unlikely that he would meet his nutritional needs.  PEG tube placement pending, apparently there is a Producer, television/film/video and IR will place one when stock will be renewed.   Persistent hypokalemia -refuses lab intermittently   Scheduled Meds: . chlorhexidine gluconate (MEDLINE KIT)  15 mL Mouth Rinse BID  . Chlorhexidine Gluconate Cloth  6 each  Topical Q0600  . feeding supplement (PRO-STAT SUGAR FREE  64)  30 mL Per Tube Daily  . free water  200 mL Per Tube Q6H  . mouth rinse  15 mL Mouth Rinse 10 times per day  . methylPREDNISolone (SOLU-MEDROL) injection  40 mg Intravenous Q12H  . pantoprazole  40 mg Intravenous Q12H  . potassium chloride  40 mEq Per Tube Once  . QUEtiapine  12.5 mg Per Tube QHS  . scopolamine  1 patch Transdermal Q72H  . sodium chloride flush  10-40 mL Intracatheter Q12H  . sodium chloride flush  10-40 mL Intracatheter Q12H   Continuous Infusions: . sodium chloride    . dextrose 50 mL/hr at 03/29/20 0205  . feeding supplement (JEVITY 1.2 CAL) 1,000 mL (03/31/20 2008)   PRN Meds:.sodium chloride, acetaminophen, dextrose, docusate sodium, gelatin adsorbable, guaiFENesin-dextromethorphan, haloperidol lactate, hydrALAZINE, ipratropium-albuterol, LORazepam, metoprolol tartrate, sodium chloride flush  DVT prophylaxis: SCDs Code Status: DNR Family Communication: Called niece and discussed with her daughter on 4/16 Patient admitted from: home Anticipated d/c place: SNF vs LTACH  Barriers to d/c: PEG tube placement and TOC for disposition  Consultants:  PCCM IR Palliative care  Procedures:  EGD GDA embolization Tracheostomy 4/5  Microbiology  Respiratory culture 4/10-normal respiratory flora  Antimicrobials: Doxycycline 4/10 >> 4/16   Objective: Vitals:   04/03/20 0711 04/03/20 0722 04/03/20 1111 04/03/20 1112  BP: (!) 104/91 (!) 104/91 (!) 149/103   Pulse: (!) 30 (!) 112 (!) 117 (!) 120  Resp: (!) 31 (!) 22 (!) 22 (!) 24  Temp: 98.5 F (36.9 C)  98.9 F (37.2 C)   TempSrc: Axillary  Axillary   SpO2: 91% 100% 97% 100%  Weight:      Height:        Intake/Output Summary (Last 24 hours) at 04/03/2020 1131 Last data filed at 04/02/2020 2135 Gross per 24 hour  Intake --  Output 1100 ml  Net -1100 ml   Filed Weights   03/23/20 0342 03/25/20 0500 03/27/20 0500  Weight: 77.8 kg 77.7 kg 70.3 kg    Examination:  Constitutional: NAD, calm,  comfortable Vitals:   04/03/20 0711 04/03/20 0722 04/03/20 1111 04/03/20 1112  BP: (!) 104/91 (!) 104/91 (!) 149/103   Pulse: (!) 30 (!) 112 (!) 117 (!) 120  Resp: (!) 31 (!) 22 (!) 22 (!) 24  Temp: 98.5 F (36.9 C)  98.9 F (37.2 C)   TempSrc: Axillary  Axillary   SpO2: 91% 100% 97% 100%  Weight:      Height:       Eyes: PERRL, lids and conjunctivae normal ENMT: Mucous membranes are dry Neck: normal, supple, tracheostomy in place Respiratory: Coarse breath sounds bilaterally, no wheezing Cardiovascular: Regular rate and rhythm, no murmurs / rubs / gallops.  Abdomen: no tenderness, no masses palpated. Bowel sounds positive.  Musculoskeletal: no clubbing / cyanosis. Normal muscle tone.  Skin: no rashes, lesions, ulcers. No induration Neurologic: Does not follow commands consistently but moves all 4 independently and strength appears equal  Data Reviewed: I have independently reviewed following labs and imaging studies   CBC: Recent Labs  Lab 03/27/20 1515 03/28/20 0815 03/30/20 0539 04/02/20 1014  WBC 13.8* 13.2* 11.3* 15.6*  NEUTROABS 11.4*  --   --   --   HGB 9.9* 10.2* 10.7* 11.8*  HCT 30.4* 31.9* 32.2* 35.4*  MCV 95.3 97.0 91.7 92.2  PLT 436* 391 449* 505   Basic Metabolic Panel: Recent Labs  Lab 03/28/20 0606 03/28/20  1708 03/30/20 0539 03/31/20 0727 03/31/20 1630 04/01/20 0621 04/02/20 1014  NA 136  --  140 139  --   --  139  K 5.1  --  3.3* 3.7  --   --  4.1  CL 103  --  103 101  --   --  102  CO2 21*  --  28 25  --   --  28  GLUCOSE 83  --  110* 117*  --   --  108*  BUN 9  --  6* 7*  --   --  13  CREATININE 0.76  --  0.78 0.74  --   --  0.71  CALCIUM 8.5*  --  8.8* 8.8*  --   --  8.6*  MG  --   --  2.0  --   --   --   --   PHOS 3.8   < > 3.3 3.8 3.6 3.6 3.3   < > = values in this interval not displayed.   Liver Function Tests: Recent Labs  Lab 03/28/20 0606 03/30/20 0539 04/02/20 1014  AST 58* 26 23  ALT _0 ALKPHOS 73 71 65  BILITOT  1.6* 0.9 0.5  PROT 6.1* 6.4* 5.7*  ALBUMIN 2.8* 2.5* 2.5*   Coagulation Profile: No results for input(s): INR, PROTIME in the last 168 hours. HbA1C: No results for input(s): HGBA1C in the last 72 hours. CBG: Recent Labs  Lab 04/02/20 2125 04/03/20 0004 04/03/20 0322 04/03/20 0734 04/03/20 1116  GLUCAP 115* 136* 129* 87 124*    Recent Results (from the past 240 hour(s))  Culture, respiratory (non-expectorated)     Status: None   Collection Time: 03/25/20 12:02 PM   Specimen: Tracheal Aspirate; Respiratory  Result Value Ref Range Status   Specimen Description TRACHEAL ASPIRATE  Final   Special Requests NONE  Final   Gram Stain   Final    RARE WBC PRESENT, PREDOMINANTLY MONONUCLEAR FEW GRAM POSITIVE COCCI IN PAIRS FEW GRAM NEGATIVE RODS    Culture   Final    Consistent with normal respiratory flora. Performed at [facility], [place] [address] [place], [place] [address]    Report Status 03/28/2020 FINAL  Final  Culture, blood (Routine X 2) w Reflex to ID Panel     Status: None   Collection Time: 03/25/20  4:43 PM   Specimen: BLOOD  Result Value Ref Range Status   Specimen Description BLOOD BLOOD LEFT WRIST  Final   Special Requests   Final    BOTTLES DRAWN AEROBIC ONLY Blood Culture adequate volume   Culture   Final    NO GROWTH 5 DAYS Performed at [facility], [place] [address], [place], [place] [address]    Report Status 03/30/2020 FINAL  Final     Radiology Studies: No results found. Marzetta Board, MD, PhD [facility]  Between 7 am - 7 pm I am available, please contact me via Amion or Securechat  Between 7 pm - 7 am I am not available, please contact night coverage MD/APP via Amion

## 2020-04-04 ENCOUNTER — Inpatient Hospital Stay (HOSPITAL_COMMUNITY): Payer: Medicaid Other

## 2020-04-04 DIAGNOSIS — I63429 Cerebral infarction due to embolism of unspecified anterior cerebral artery: Secondary | ICD-10-CM | POA: Diagnosis not present

## 2020-04-04 DIAGNOSIS — I469 Cardiac arrest, cause unspecified: Secondary | ICD-10-CM | POA: Diagnosis not present

## 2020-04-04 DIAGNOSIS — J9601 Acute respiratory failure with hypoxia: Secondary | ICD-10-CM | POA: Diagnosis not present

## 2020-04-04 DIAGNOSIS — G931 Anoxic brain damage, not elsewhere classified: Secondary | ICD-10-CM | POA: Diagnosis not present

## 2020-04-04 LAB — GLUCOSE, CAPILLARY
Glucose-Capillary: 109 mg/dL — ABNORMAL HIGH (ref 70–99)
Glucose-Capillary: 114 mg/dL — ABNORMAL HIGH (ref 70–99)
Glucose-Capillary: 124 mg/dL — ABNORMAL HIGH (ref 70–99)
Glucose-Capillary: 147 mg/dL — ABNORMAL HIGH (ref 70–99)
Glucose-Capillary: 115 mg/dL — ABNORMAL HIGH (ref 70–99)
Glucose-Capillary: 103 mg/dL — ABNORMAL HIGH (ref 70–99)

## 2020-04-04 LAB — CBC
HCT: 34.5 % — ABNORMAL LOW (ref 39.0–52.0)
Hemoglobin: 11.6 g/dL — ABNORMAL LOW (ref 13.0–17.0)
MCH: 31.1 pg (ref 26.0–34.0)
MCHC: 33.6 g/dL (ref 30.0–36.0)
MCV: 92.5 fL (ref 80.0–100.0)
Platelets: 336 10*3/uL (ref 150–400)
RBC: 3.73 MIL/uL — ABNORMAL LOW (ref 4.22–5.81)
RDW: 17.2 % — ABNORMAL HIGH (ref 11.5–15.5)
WBC: 20.2 10*3/uL — ABNORMAL HIGH (ref 4.0–10.5)
nRBC: 0 % (ref 0.0–0.2)

## 2020-04-04 LAB — POTASSIUM
Potassium: 5.2 mmol/L — ABNORMAL HIGH (ref 3.5–5.1)
Potassium: 4.5 mmol/L (ref 3.5–5.1)
Potassium: 4.5 mmol/L (ref 3.5–5.1)
Potassium: 5.4 mmol/L — ABNORMAL HIGH (ref 3.5–5.1)

## 2020-04-04 LAB — BASIC METABOLIC PANEL
Anion gap: 11 (ref 5–15)
BUN: 14 mg/dL (ref 8–23)
CO2: 25 mmol/L (ref 22–32)
Calcium: 9.1 mg/dL (ref 8.9–10.3)
Chloride: 101 mmol/L (ref 98–111)
Creatinine, Ser: 0.67 mg/dL (ref 0.61–1.24)
GFR calc Af Amer: >60 mL/min (ref >60)
GFR calc non Af Amer: >60 mL/min (ref >60)
Glucose, Bld: 119 mg/dL — ABNORMAL HIGH (ref 70–99)
Potassium: 6.6 mmol/L (ref 3.5–5.1)
Sodium: 137 mmol/L (ref 135–145)

## 2020-04-04 LAB — PHOSPHORUS
Phosphorus: 5.2 mg/dL — ABNORMAL HIGH (ref 2.5–4.6)
Phosphorus: 4.9 mg/dL — ABNORMAL HIGH (ref 2.5–4.6)

## 2020-04-04 MED ORDER — SODIUM ZIRCONIUM CYCLOSILICATE 10 G PO PACK
10.0000 g | PACK | Freq: Once | ORAL | Status: AC
  Administered 2020-04-04: 10 g
  Filled 2020-04-04: qty 1

## 2020-04-04 MED ORDER — PANTOPRAZOLE SODIUM 40 MG PO PACK
40.0000 mg | PACK | Freq: Two times a day (BID) | ORAL | Status: DC
  Administered 2020-04-04 – 2020-04-13 (×18): 40 mg
  Filled 2020-04-04 (×18): qty 20

## 2020-04-04 MED ORDER — SODIUM BICARBONATE 8.4 % IV SOLN
50.0000 meq | Freq: Once | INTRAVENOUS | Status: AC
  Administered 2020-04-04: 50 meq via INTRAVENOUS
  Filled 2020-04-04: qty 50

## 2020-04-04 MED ORDER — PREDNISONE 20 MG PO TABS
20.0000 mg | ORAL_TABLET | Freq: Every day | ORAL | Status: DC
  Administered 2020-04-04 – 2020-04-11 (×8): 20 mg via ORAL
  Filled 2020-04-04 (×9): qty 1

## 2020-04-04 MED ORDER — SODIUM ZIRCONIUM CYCLOSILICATE 5 G PO PACK
5.0000 g | PACK | Freq: Once | ORAL | Status: AC
  Administered 2020-04-04: 5 g via ORAL
  Filled 2020-04-04: qty 1

## 2020-04-04 NOTE — Progress Notes (Signed)
PROGRESS NOTE  Kenneth Sullivan PTW:[id number] DOB: [birth year] DOA: 03/07/2020 PCP: Medicine, [name]   LOS: 28 days   Brief Narrative / Interim history: 64 year old with no significant past medical history came into the hospital with shortness of breath, was hypoxic, had a witnessed bradycardic event lead into asystole/cardiac arrest.  Was found to have significantly massive PE/DVT and RV strain, also was found to have an acute GI bleed from a gastric ulcer.  He underwent IR guided mechanical thrombectomy, IVC filter and GDA embolization.  Also underwent an EGD which showed oozing gastric ulcer with clot.  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  Currently has a trach in place and a PEG tube placement is pending, there is a Producer, television/film/video apparently and IR is waiting for shipment.  TOC on board for LTAC versus SNF placement.  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  MRI of the brain showed multiple acute to subacute infarcts involving bilateral cerebral hemisphere consistent with global hypoperfusion related to cardiac arrest.  Underwent IR guided mechanical thrombectomy, IVC filter.  MRI also showed evidence of hemorrhagic conversion of his infarcts. Overall poor prognosis, palliative care consulted, he is DNR but we are to continue aggressive medical management including PEG tube as well as placement.  Still waiting on IR  for PEG tube placement, currently is on backorder nationally  Active Problems Acute respiratory failure with hypoxia-due to PE, he required intubation and eventually ended up having trach on 4/5.  Currently on trach collar but continues to have copious and thick thick secretions, he is getting scopolamine patch. PCCM recommends #4 cuffless trach once stoma is more mature, with at least 14 days.  Patient had an event on 4/11 was found on the floor and trach was dislodged.  Respiratory status is stable, pulmonology reevaluated today and he is #6 cuffed trach has been changed to cuffless, continue PMV with speech.  Repeat a chest x-ray today given leukocytosis  Tracheobronchitis-tracheal aspirate sent for cultures, it showed normal respiratory flora, he has completed 7 days of doxycycline with end date 4/17.  Monitor clinically, repeat chest x-ray as above  Leukocytosis-monitor for infections, he is afebrile, possibly due to steroids and change in of the prednisone, will need to taper  Acute blood loss anemia due to acute GI bleed secondary to large gastric ulcer-GI consulted, underwent an EGD and eventually requiring GDA embolization.  Continue PPI.  Hemoglobin stable, monitor periodically  Toothache-patient complained of a severe tooth ache at 1 point, history is difficult to obtain due to tracheostomy status, have obtained a CT scan of the maxillofacial which showed chronic left TMJ degeneration without superimposed acute abnormalities or acute inflammatory processes.  Symptomatic management  Agitation/ICU delirium-as needed Haldol/Ativan  Dysphagia -speech evaluated, currently on dysphagia 1 with thin liquids however unlikely that he would meet his nutritional needs.  PEG tube placement pending, apparently there is a Producer, television/film/video and IR will place one when stock will be renewed.  Having a core track temporarily  Persistent hypokalemia /hyperkalemia today-refuses lab  intermittently, for some  reason his potassium went up to 6.6, he is not on potassium supplementations nor ACE/ARB, possibly due to tube feeds (?).  Given Lokelma and temporizing measures, monitor today serial potassium levels   Scheduled Meds: . chlorhexidine gluconate (MEDLINE KIT)  15 mL Mouth Rinse BID  . Chlorhexidine Gluconate Cloth  6 each Topical Q0600  . feeding supplement (PRO-STAT SUGAR FREE 64)  30 mL Per Tube Daily  . free water  200 mL Per Tube Q6H  . mouth rinse  15 mL Mouth Rinse 10 times per day  . pantoprazole  40 mg Intravenous Q12H  . predniSONE  20 mg Oral Q breakfast  . QUEtiapine  12.5 mg Per Tube QHS  . scopolamine  1 patch Transdermal Q72H  . sodium chloride flush  10-40 mL Intracatheter Q12H  . sodium chloride flush  10-40 mL Intracatheter Q12H  . sodium zirconium cyclosilicate  10 g Per Tube Once   Continuous Infusions: . sodium chloride    . dextrose 50 mL/hr at 03/29/20 0205  . feeding supplement (JEVITY 1.2 CAL) 1,000 mL (04/03/20 2049)   PRN Meds:.sodium chloride, acetaminophen, dextrose, docusate sodium, gelatin adsorbable, guaiFENesin-dextromethorphan, haloperidol lactate, hydrALAZINE, ipratropium-albuterol, LORazepam, metoprolol tartrate, sodium chloride flush  DVT prophylaxis: SCDs Code Status: DNR Family Communication: Called niece and discussed with her daughter Patient admitted from: home Anticipated d/c place: SNF vs LTACH  Barriers to d/c: PEG tube placement and TOC for disposition  Consultants:  PCCM IR Palliative care  Procedures:  EGD GDA embolization Tracheostomy 4/5  Microbiology  Respiratory culture 4/10-normal respiratory flora  Antimicrobials: Doxycycline 4/10 >> 4/16   Objective: Vitals:   04/03/20 2359 04/04/20 0130 04/04/20 0359 04/04/20 0410  BP:    129/76  Pulse: (!) 108  98 (!) 108  Resp: (!) 27 18 16    Temp:    98 F (36.7 C)  TempSrc:    Oral  SpO2: 96%  100% 95%  Weight:      Height:        Intake/Output Summary (Last 24  hours) at 04/04/2020 0659 Last data filed at 04/04/2020 0451 Gross per 24 hour  Intake --  Output 750 ml  Net -750 ml   Filed Weights   03/23/20 0342 03/25/20 0500 03/27/20 0500  Weight: 77.8 kg 77.7 kg 70.3 kg    Examination:  Constitutional: No distress Vitals:   04/03/20 2359 04/04/20 0130 04/04/20 0359 04/04/20 0410  BP:    129/76  Pulse: (!) 108  98 (!) 108  Resp: (!) 27 18 16    Temp:    98 F (36.7 C)  TempSrc:    Oral  SpO2: 96%  100% 95%  Weight:      Height:       Eyes: No scleral icterus ENMT: Dry mucous membranes Neck: normal, supple, tracheostomy in place Respiratory: Coarse breath sounds bilaterally without wheezing Cardiovascular: Regular rate and rhythm, no murmurs, no edema Abdomen: Soft, nontender, nondistended, bowel sounds positive Musculoskeletal: no clubbing / cyanosis. Normal muscle tone.  Skin: No rashes appreciated Neurologic: Does not follow commands consistently but moves all 4 extremities and strength appears equal  Data Reviewed: I have independently reviewed following labs and imaging studies   CBC: Recent Labs  Lab 03/28/20 0815 03/30/20 0539 04/02/20 1014 04/03/20 1406 04/04/20 0205  WBC 13.2* 11.3* 15.6* 18.6* 20.2*  HGB 10.2* 10.7* 11.8* 12.5* 11.6*  HCT 31.9* 32.2* 35.4* 37.6* 34.5*  MCV 97.0 91.7 92.2 92.8 92.5  PLT 391  449* 381 391 188   Basic Metabolic Panel: Recent Labs  Lab 03/30/20 0539 03/30/20 0539 03/31/20 0727 03/31/20 0727 03/31/20 1630 04/01/20 0621 04/02/20 1014 04/03/20 1406 04/04/20 0143 04/04/20 0205 04/04/20 0425  NA 140  --  139  --   --   --  139 138  --  137  --   K 3.3*   < > 3.7  --   --   --  4.1 5.4*  --  6.6* 5.2*  CL 103  --  101  --   --   --  102 100  --  101  --   CO2 28  --  25  --   --   --  28 27  --  25  --   GLUCOSE 110*  --  117*  --   --   --  108* 119*  --  119*  --   BUN 6*  --  7*  --   --   --  13 13  --  14  --   CREATININE 0.78  --  0.74  --   --   --  0.71 0.66  --  0.67   --   CALCIUM 8.8*  --  8.8*  --   --   --  8.6* 9.3  --  9.1  --   MG 2.0  --   --   --   --   --   --   --   --   --   --   PHOS 3.3   < > 3.8   < > 3.6 3.6 3.3 3.4 5.2*  --   --    < > = values in this interval not displayed.   Liver Function Tests: Recent Labs  Lab 03/30/20 0539 04/02/20 1014  AST 26 23  ALT 20 22  ALKPHOS 71 65  BILITOT 0.9 0.5  PROT 6.4* 5.7*  ALBUMIN 2.5* 2.5*   Coagulation Profile: No results for input(s): INR, PROTIME in the last 168 hours. HbA1C: No results for input(s): HGBA1C in the last 72 hours. CBG: Recent Labs  Lab 04/03/20 1116 04/03/20 1553 04/03/20 1907 04/03/20 2307 04/04/20 0446  GLUCAP 124* 108* 96 116* 109*    Recent Results (from the past 240 hour(s))  Culture, respiratory (non-expectorated)     Status: None   Collection Time: 03/25/20 12:02 PM   Specimen: Tracheal Aspirate; Respiratory  Result Value Ref Range Status   Specimen Description TRACHEAL ASPIRATE  Final   Special Requests NONE  Final   Gram Stain   Final    RARE WBC PRESENT, PREDOMINANTLY MONONUCLEAR FEW GRAM POSITIVE COCCI IN PAIRS FEW GRAM NEGATIVE RODS    Culture   Final    Consistent with normal respiratory flora. Performed at [facility], [place] [address], [place], [place] [address]    Report Status 03/28/2020 FINAL  Final  Culture, blood (Routine X 2) w Reflex to ID Panel     Status: None   Collection Time: 03/25/20  4:43 PM   Specimen: BLOOD  Result Value Ref Range Status   Specimen Description BLOOD BLOOD LEFT WRIST  Final   Special Requests   Final    BOTTLES DRAWN AEROBIC ONLY Blood Culture adequate volume   Culture   Final    NO GROWTH 5 DAYS Performed at [facility], [place] [address] [place], [place] [address]    Report Status 03/30/2020 FINAL  Final     Radiology Studies: No results found. Marzetta Board, MD, PhD [facility]  Between 7 am - 7 pm I am available, please contact me via Amion or Securechat  Between 7  pm - 7 am I am not available, please contact night coverage MD/APP via Amion

## 2020-04-04 NOTE — Progress Notes (Signed)
Pt very agitated laying of his right side and swinging his legs and arms. This nurse, with help, cleaned pt, changed his sheets and his posey belt then gave pt ativan.

## 2020-04-04 NOTE — Progress Notes (Signed)
K. 6.6. Reported critical value to provider on call. Orders rec'd. Attempt made at a 12 lead EKG. Pt very agitated. Flailing arms and legs and spitting. EKG done and placed in chart.

## 2020-04-04 NOTE — Progress Notes (Signed)
Patient refused lab draw.

## 2020-04-04 NOTE — Plan of Care (Signed)
  Problem: Activity: Goal: Ability to tolerate increased activity will improve Outcome: Progressing   Problem: Respiratory: Goal: Ability to maintain a clear airway and adequate ventilation will improve Outcome: Progressing   Problem: Activity: Goal: Ability to tolerate increased activity will improve Outcome: Progressing   Problem: Respiratory: Goal: Ability to maintain a clear airway and adequate ventilation will improve Outcome: Progressing   Problem: Clinical Measurements: Goal: Will remain free from infection Outcome: Progressing Goal: Diagnostic test results will improve Outcome: Progressing Goal: Respiratory complications will improve Outcome: Progressing Goal: Cardiovascular complication will be avoided Outcome: Progressing   Problem: Activity: Goal: Risk for activity intolerance will decrease Outcome: Progressing   Problem: Nutrition: Goal: Adequate nutrition will be maintained Outcome: Progressing   Problem: Elimination: Goal: Will not experience complications related to bowel motility Outcome: Progressing Goal: Will not experience complications related to urinary retention Outcome: Progressing   Problem: Pain Managment: Goal: General experience of comfort will improve Outcome: Progressing   Problem: Safety: Goal: Ability to remain free from injury will improve Outcome: Progressing   Problem: Skin Integrity: Goal: Risk for impaired skin integrity will decrease Outcome: Progressing   Problem: Role Relationship: Goal: Method of communication will improve Outcome: Not Progressing   Problem: Role Relationship: Goal: Method of communication will improve Outcome: Not Progressing   Problem: Education: Goal: Knowledge of General Education information will improve Description: Including pain rating scale, medication(s)/side effects and non-pharmacologic comfort measures Outcome: Not Progressing   Problem: Health Behavior/Discharge Planning: Goal:  Ability to manage health-related needs will improve Outcome: Not Progressing

## 2020-04-05 LAB — CBC
HCT: 35.3 % — ABNORMAL LOW (ref 39.0–52.0)
Hemoglobin: 11.7 g/dL — ABNORMAL LOW (ref 13.0–17.0)
MCH: 30.5 pg (ref 26.0–34.0)
MCHC: 33.1 g/dL (ref 30.0–36.0)
MCV: 92.2 fL (ref 80.0–100.0)
Platelets: 358 10*3/uL (ref 150–400)
RBC: 3.83 MIL/uL — ABNORMAL LOW (ref 4.22–5.81)
RDW: 17.7 % — ABNORMAL HIGH (ref 11.5–15.5)
WBC: 15.2 10*3/uL — ABNORMAL HIGH (ref 4.0–10.5)
nRBC: 0 % (ref 0.0–0.2)

## 2020-04-05 LAB — COMPREHENSIVE METABOLIC PANEL
ALT: 22 U/L (ref 0–44)
AST: 34 U/L (ref 15–41)
Albumin: 3.1 g/dL — ABNORMAL LOW (ref 3.5–5.0)
Alkaline Phosphatase: 74 U/L (ref 38–126)
Anion gap: 12 (ref 5–15)
BUN: 19 mg/dL (ref 8–23)
CO2: 29 mmol/L (ref 22–32)
Calcium: 9.2 mg/dL (ref 8.9–10.3)
Chloride: 97 mmol/L — ABNORMAL LOW (ref 98–111)
Creatinine, Ser: 0.76 mg/dL (ref 0.61–1.24)
GFR calc Af Amer: >60 mL/min (ref >60)
GFR calc non Af Amer: >60 mL/min (ref >60)
Glucose, Bld: 103 mg/dL — ABNORMAL HIGH (ref 70–99)
Potassium: 5 mmol/L (ref 3.5–5.1)
Sodium: 138 mmol/L (ref 135–145)
Total Bilirubin: 1 mg/dL (ref 0.3–1.2)
Total Protein: 6.1 g/dL — ABNORMAL LOW (ref 6.5–8.1)

## 2020-04-05 LAB — MAGNESIUM: Magnesium: 2.3 mg/dL (ref 1.7–2.4)

## 2020-04-05 LAB — GLUCOSE, CAPILLARY
Glucose-Capillary: 94 mg/dL (ref 70–99)
Glucose-Capillary: 88 mg/dL (ref 70–99)
Glucose-Capillary: 112 mg/dL — ABNORMAL HIGH (ref 70–99)
Glucose-Capillary: 98 mg/dL (ref 70–99)
Glucose-Capillary: 96 mg/dL (ref 70–99)
Glucose-Capillary: 63 mg/dL — ABNORMAL LOW (ref 70–99)

## 2020-04-05 LAB — PHOSPHORUS: Phosphorus: 5.1 mg/dL — ABNORMAL HIGH (ref 2.5–4.6)

## 2020-04-05 LAB — POTASSIUM: Potassium: 4.1 mmol/L (ref 3.5–5.1)

## 2020-04-05 MED ORDER — PANCRELIPASE (LIP-PROT-AMYL) 10440-39150 UNITS PO TABS
20880.0000 [IU] | ORAL_TABLET | Freq: Once | ORAL | Status: AC
  Administered 2020-04-05: 20880 [IU]
  Filled 2020-04-05: qty 2

## 2020-04-05 MED ORDER — SODIUM BICARBONATE 650 MG PO TABS
650.0000 mg | ORAL_TABLET | Freq: Once | ORAL | Status: AC
  Administered 2020-04-05: 650 mg
  Filled 2020-04-05: qty 1

## 2020-04-05 NOTE — Progress Notes (Signed)
  Speech Language Pathology Treatment: Dysphagia;Passy Muir Speaking valve  Patient Details Name: Kenneth Sullivan MRN: [id number] DOB: [birth year] Today's Date: 04/05/2020 Time: [id number] SLP Time Calculation (min) (ACUTE ONLY): 18 min  Assessment / Plan / Recommendation Clinical Impression  Pt seen with Passy-Muir speaking valve and dysphagia. RN reported pt consumed applesauce earlier today and late lunch just arrived. Pt demonstrated baseline frustration however it was decreased today and pt easier to redirect. He allowed donning of PMV and tolerated without indications of back pressure. Verbal cues required to reduce rate and increase volume to improve intelligibility although intensity has increased from initial assessment. Vitals remained stable.      Pt stated "I'm hungry and just want to eat." Required max assist for self feeding when attempted and pt becoming frustrated. Max cues to attend to left side of environment/plate. Therapist fed pt to ensure caloric intake however will encourage and assist with self feeding in the future. Delayed coughing that appeared related to puree- pt attempting to speak immediately after swallows. One sips thin was unremarkable. Continue puree/thin, PMV during therapies with full supervision. Can pt switch to a cuffless trach?   HPI HPI: 64 year old admitted 3/23 with shortness of breath with sats in the 40s on with witnessed bradycardic, asystolic arrest when EMS was present with 3 rounds of epi, 20 minutes to ROSC per chart. Found to also have bilateral PR's and had GI bleed following arrest. MRI showed multifocal acute-subactue ischemic infarcts c/w global hypoperfusion. Intubated 3/23 adn trach placed 4/2. CXR 4/3 no active disease. No evidence of pneumonia or pulmonary edema. Per chart pt smokes 1 pack/day, consumes 2-3 forty oz beers daily, + marijuana.      SLP Plan  Continue with current plan of care       Recommendations  Diet recommendations:  Dysphagia 1 (puree);Thin liquid Liquids provided via: Straw;Cup Medication Administration: Crushed with puree Supervision: Staff to assist with self feeding;Full supervision/cueing for compensatory strategies Compensations: Minimize environmental distractions;Slow rate;Small sips/bites Postural Changes and/or Swallow Maneuvers: Seated upright 90 degrees      Patient may use Passy-Muir Speech Valve: During all therapies with supervision;Intermittently with supervision PMSV Supervision: Full MD: Please consider changing trach tube to : Cuffless         Oral Care Recommendations: Oral care QID Follow up Recommendations: Skilled Nursing facility SLP Visit Diagnosis: Dysphagia, unspecified (R13.10) Plan: Continue with current plan of care                      Houston Siren 04/05/2020, 4:32 PM  [name] Colvin Caroli.Ed Risk analyst [phone] Office [phone]

## 2020-04-05 NOTE — Progress Notes (Addendum)
Nutrition Follow-up  DOCUMENTATION CODES:   Not applicable  INTERVENTION:   Continue tube feeding:  -Jevity 1.2 @ 75 ml/hr via Cortrak (1800 ml) -Pro-stat 30 ml once daily -Free water flushes 200 ml Q6 hours  Provides 2260 kcal, 115 gm protein, 1458 ml free water daily (2258 ml with flushes).   NUTRITION DIAGNOSIS:   Inadequate oral intake related to lethargy/confusion as evidenced by meal completion < 25%.  Ongoing  GOAL:   Patient will meet greater than or equal to 90% of their needs  Met via TF  MONITOR:   PO intake, Supplement acceptance, Weight trends, Labs, I & O's, TF tolerance, Skin  REASON FOR ASSESSMENT:   Ventilator   ASSESSMENT:   64 yo male admitted S/P cardiac arrest, S/P normothermia protocol. Found to have massive PE, DVT, GI bleed from gastric ulcer. PMH includes current smoker, heavy alcohol use.  4/02 - trach  4/09 - Cortrak placed  4/11 - pt pulled out trach, replaced by RT; TF held due to Cortrak leaking  4/13 - MBS, Dysphagia 1 with thin liquids started 4/14 - Cortrak removed  RD working remotely.  Per RN pt unable to tolerate PMV for longer than minutes. Not taking any pureed foods. Tolerating tube feeding at goal rate. Potassium increased yesterday but now WDL with lokelma. Continue to trend potassium and phosphorous.   PEG tubes remain in Producer, television/film/video. Pt awaiting placement.   Admission weight: 72.6 kg Current weight: 70.3 kg   Medications: prednisone Labs: Phosphorus 5.1 (H) CBG 88-147  Diet Order:   Diet Order            DIET - DYS 1 Room service appropriate? Yes; Fluid consistency: Thin  Diet effective now              EDUCATION NEEDS:   Not appropriate for education at this time  Skin:  Skin Assessment: Skin Integrity Issues: Skin Integrity Issues:: Other (Comment) Other: puncture neck, groin  Last BM:  4/21  Height:   Ht Readings from Last 1 Encounters:  03/07/20 6' 2"  (1.88 m)    Weight:   Wt  Readings from Last 1 Encounters:  03/27/20 70.3 kg    BMI:  Body mass index is 19.9 kg/m.  Estimated Nutritional Needs:   Kcal:  2100-2300  Protein:  110-125 gm  Fluid:  >/= 2 L   Mariana Single RD, LDN Clinical Nutrition Pager listed in [place]

## 2020-04-05 NOTE — Progress Notes (Signed)
PROGRESS NOTE  Kenneth Sullivan  DOB: [birth year]  PCP: [facility], [name] [id number]  DOA: 03/07/2020  LOS: 29 days   Chief Complaint  Patient presents with  . Cardiac Arrest   Brief narrative: 64 year old with no significant past medical history came into the hospital with shortness of breath, was hypoxic, had a witnessed bradycardic event lead into asystole/cardiac arrest.  Was found to have significantly massive PE/DVT and RV strain, also was found to have an acute GI bleed from a gastric ulcer.  He underwent IR guided mechanical thrombectomy, IVC filter and GDA embolization.  Also underwent an EGD which showed oozing gastric ulcer with clot.  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  Currently has a trach in place and a PEG tube placement is pending, there is a Producer, television/film/video apparently and IR is waiting for shipment.  TOC on board for LTAC versus SNF placement.  Subjective: Patient was seen and examined this morning.  Middle-aged African-American male.  Looks older for his age.  Alert, awake, unable to answer or follow command.  Has trach collar.  Has a Dobbhoff tube. Does not seem to be in physical distress.  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   MRI of the brain showed multiple acute to subacute infarcts involving bilateral cerebral hemisphere consistent with global hypoperfusion related to cardiac arrest.  Underwent IR guided mechanical thrombectomy, IVC filter.  MRI also showed evidence of hemorrhagic conversion of his infarcts.  Overall  poor prognosis, palliative care consulted, he is DNR but we are to continue aggressive medical management including PEG tube as well as placement.  Still waiting on IR for PEG tube placement, currently is on backorder nationally. -We will look into the option of sending him to [facility] where he can get PEG tube placement as well.  Acute respiratory failure with hypoxia -due to PE, he required intubation and eventually ended up having trach on 4/5.   -Currently on trach collar but continues to have copious and thick thick secretions, he is getting scopolamine patch. PCCM recommends #4 cuffless trach once stoma is more mature, with at least 14 days.   -Patient had an event on 4/11 was found on the floor and trach was dislodged.  Respiratory status is stable, pulmonology reevaluated today and he is #6 cuffed trach has been changed to cuffless, continue PMV with speech.  Repeat a chest x-ray today given leukocytosis  Tracheobronchitis-tracheal aspirate sent for cultures, it showed normal respiratory flora, he has completed 7 days of doxycycline with end date 4/17.  Monitor clinically, repeat chest x-ray as above  Leukocytosis-monitor for infections, he is afebrile, possibly due to steroids and change in of the prednisone, will need to taper  Acute blood loss anemia due to acute GI bleed secondary to large gastric ulcer-GI consulted, underwent an EGD and eventually requiring GDA embolization.  Continue PPI.  Hemoglobin stable, monitor periodically  Toothache-patient complained of a severe tooth ache at 1 point, history is difficult to obtain due to tracheostomy status, have obtained a CT scan of the maxillofacial which showed chronic left TMJ degeneration without superimposed acute abnormalities or acute inflammatory processes.  Symptomatic management  Agitation/ICU delirium-as needed Haldol/Ativan  Dysphagia -speech evaluated, currently on dysphagia 1 with thin liquids however unlikely that he would  meet his nutritional needs.  PEG tube placement pending, apparently there is a Producer, television/film/video and IR will place one when stock will be renewed.  Having a core track temporarily.  Dysphagia 1 diet recommended today.  Persistent hypokalemia /hyperkalemia today-refuses lab intermittently, for some reason his potassium went up to 6.6, he is not on potassium supplementations nor ACE/ARB, possibly due to tube feeds (?).  Given Lokelma and temporizing measures, monitor today serial potassium levels  Mobility: Bedbound Code Status:  DNR  DVT prophylaxis:  SCDs Antimicrobials:  Currently not on antibiotics Fluid: Dextrose at 50 mils per hour Diet: Dysphagia 1 diet  Consultants: Palliative care, PCCM signed off Family Communication:  None at bedside  Status is: Inpatient  Remains inpatient appropriate because:Inpatient level of care appropriate due to severity of illness   Dispo: The patient is from: Home              Anticipated d/c is to: LTAC              Anticipated d/c date is: 2 days              Patient currently is not medically stable to d/c.   Antimicrobials: Anti-infectives (From admission, onward)   Start     Dose/Rate Route Frequency Ordered Stop   03/26/20 1100  doxycycline (VIBRAMYCIN) 100 mg in sodium chloride 0.9 % 250 mL IVPB  Status:  Discontinued     100 mg 125 mL/hr over 120 Minutes Intravenous 2 times daily 03/26/20 1002 04/01/20 1035   03/25/20 1100  doxycycline (VIBRA-TABS) tablet 100 mg  Status:  Discontinued     100 mg Per Tube Every 12 hours 03/25/20 1009 03/26/20 1002   03/25/20 1015  doxycycline (VIBRAMYCIN) 100 mg in sodium chloride 0.9 % 250 mL IVPB  Status:  Discontinued    Note to Pharmacy: To be given after the tracheal aspirate is collected.   100 mg 125 mL/hr over 120 Minutes Intravenous Every 12 hours 03/25/20 1005 03/25/20 1008        Code Status: DNR   Diet Order            DIET - DYS 1 Room service appropriate? Yes; Fluid consistency:  Thin  Diet effective now              Infusions:  . sodium chloride    . dextrose 50 mL/hr at 03/29/20 0205  . feeding supplement (JEVITY 1.2 CAL) 1,000 mL (04/05/20 0546)    Scheduled Meds: . chlorhexidine gluconate (MEDLINE KIT)  15 mL Mouth Rinse BID  . Chlorhexidine Gluconate Cloth  6 each Topical Q0600  . feeding supplement (PRO-STAT SUGAR FREE 64)  30 mL Per Tube Daily  . free water  200 mL Per Tube Q6H  . mouth rinse  15 mL Mouth Rinse 10 times per day  . pantoprazole sodium  40 mg Per Tube BID  . predniSONE  20 mg Oral Q breakfast  . QUEtiapine  12.5 mg Per Tube QHS  . scopolamine  1 patch Transdermal Q72H  . sodium chloride flush  10-40 mL Intracatheter Q12H  . sodium chloride flush  10-40 mL Intracatheter Q12H    PRN meds: sodium chloride, acetaminophen, dextrose, docusate sodium, gelatin adsorbable, guaiFENesin-dextromethorphan, haloperidol lactate, hydrALAZINE, ipratropium-albuterol, LORazepam, metoprolol tartrate, sodium chloride flush   Objective: Vitals:   04/05/20 1052 04/05/20 1443  BP:    Pulse: (!) 116 (!) 118  Resp: (!) 22   Temp:    SpO2: 99%    No intake or output data in the 24 hours ending 04/05/20 1711 Filed Weights   03/23/20 0342 03/25/20 0500 03/27/20 0500  Weight: 77.8 kg 77.7 kg 70.3 kg   Weight change:  Body mass index is 19.9 kg/m.   Physical Exam: General exam: Not in physical discomfort.  Not a lot of secretions. Skin: No rashes, lesions or ulcers. HEENT: Atraumatic, normocephalic, supple neck, no obvious bleeding.  Copious amount of secretions Lungs: Clear to auscultation bilaterally CVS: Regular rate and rhythm, no murmur GI/Abd soft, nontender, nondistended, bowel sound present CNS: Alert, awake, unable to follow commands Psychiatry: Unable to assess mood Extremities: No pedal edema, no calf tenderness  Data Review: I have personally reviewed the laboratory data and studies available.  Recent Labs  Lab 03/30/20 0539  04/02/20 1014 04/03/20 1406 04/04/20 0205 04/05/20 0657  WBC 11.3* 15.6* 18.6* 20.2* 15.2*  HGB 10.7* 11.8* 12.5* 11.6* 11.7*  HCT 32.2* 35.4* 37.6* 34.5* 35.3*  MCV 91.7 92.2 92.8 92.5 92.2  PLT 449* 381 391 336 358   Recent Labs  Lab 03/30/20 0539 03/30/20 0539 03/31/20 0727 03/31/20 1630 04/02/20 1014 04/02/20 1014 04/03/20 1406 04/03/20 1406 04/04/20 0143 04/04/20 0205 04/04/20 0425 04/04/20 0702 04/04/20 1128 04/04/20 1554 04/05/20 0657 04/05/20 1107  NA 140   < > 139  --  139  --  138  --   --  137  --   --   --   --  138  --   K 3.3*   < > 3.7  --  4.1   < > 5.4*   < >  --  6.6*   < > 4.5 4.5 5.4* 5.0 4.1  CL 103   < > 101  --  102  --  100  --   --  101  --   --   --   --  97*  --   CO2 28   < > 25  --  28  --  27  --   --  25  --   --   --   --  29  --   GLUCOSE 110*   < > 117*  --  108*  --  119*  --   --  119*  --   --   --   --  103*  --   BUN 6*   < > 7*  --  13  --  13  --   --  14  --   --   --   --  19  --   CREATININE 0.78   < > 0.74  --  0.71  --  0.66  --   --  0.67  --   --   --   --  0.76  --   CALCIUM 8.8*   < > 8.8*  --  8.6*  --  9.3  --   --  9.1  --   --   --   --  9.2  --   MG 2.0  --   --   --   --   --   --   --   --   --   --   --   --   --  2.3  --   PHOS 3.3   < >  3.8   < > 3.3  --  3.4  --  5.2*  --   --   --   --  4.9* 5.1*  --    < > = values in this interval not displayed.    Signed, Terrilee Croak, MD [facility] Pager: [phone] (Secure Chat preferred). 04/05/2020

## 2020-04-06 ENCOUNTER — Inpatient Hospital Stay (HOSPITAL_COMMUNITY): Payer: Medicaid Other

## 2020-04-06 DIAGNOSIS — I469 Cardiac arrest, cause unspecified: Secondary | ICD-10-CM | POA: Diagnosis not present

## 2020-04-06 DIAGNOSIS — G931 Anoxic brain damage, not elsewhere classified: Secondary | ICD-10-CM | POA: Diagnosis not present

## 2020-04-06 DIAGNOSIS — Z930 Tracheostomy status: Secondary | ICD-10-CM

## 2020-04-06 LAB — GLUCOSE, CAPILLARY
Glucose-Capillary: 84 mg/dL (ref 70–99)
Glucose-Capillary: 87 mg/dL (ref 70–99)
Glucose-Capillary: 88 mg/dL (ref 70–99)
Glucose-Capillary: 91 mg/dL (ref 70–99)
Glucose-Capillary: 135 mg/dL — ABNORMAL HIGH (ref 70–99)
Glucose-Capillary: 118 mg/dL — ABNORMAL HIGH (ref 70–99)

## 2020-04-06 LAB — PHOSPHORUS
Phosphorus: 4.2 mg/dL (ref 2.5–4.6)
Phosphorus: 3.8 mg/dL (ref 2.5–4.6)

## 2020-04-06 LAB — POTASSIUM: Potassium: 4.1 mmol/L (ref 3.5–5.1)

## 2020-04-06 MED ORDER — GLYCOPYRROLATE 1 MG PO TABS
1.0000 mg | ORAL_TABLET | Freq: Three times a day (TID) | ORAL | Status: DC
  Administered 2020-04-06 – 2020-04-13 (×21): 1 mg
  Filled 2020-04-06 (×21): qty 1

## 2020-04-06 MED ORDER — LIDOCAINE VISCOUS HCL 2 % MT SOLN
3.0000 mL | Freq: Once | OROMUCOSAL | Status: AC
  Administered 2020-04-06: 3 mL via OROMUCOSAL

## 2020-04-06 MED ORDER — IOHEXOL 300 MG/ML  SOLN
10.0000 mL | Freq: Once | INTRAMUSCULAR | Status: AC | PRN
  Administered 2020-04-06: 10 mL

## 2020-04-06 NOTE — TOC Progression Note (Signed)
Transition of Care [facility]) - Progression Note    Patient Details  Name: Kenneth Sullivan MRN: [id number] Date of Birth: 07/30/1956  Transition of Care [facility]) CM/SW [place], LCSW Phone Number: 04/06/2020, 11:23 AM  Clinical Narrative:     Patient waiting to get PEG placed with IR. Unable to get placed at this time due to national shortage of G tubes, IR will schedule once shipment has been received. Patient will need SNF when medically ready. Cortrak in place until PEG. No LTACH benefits due to payor source.   Expected Discharge Plan: Skilled Nursing Facility Barriers to Discharge: Continued Medical Work up  Expected Discharge Plan and Services Expected Discharge Plan: [place]                                               Social Determinants of Health (SDOH) Interventions    Readmission Risk Interventions No flowsheet data found.

## 2020-04-06 NOTE — Progress Notes (Signed)
NAME:  Kenneth Sullivan, MRN:  [id number], DOB:  21-Jan-1956, LOS: 67 ADMISSION DATE:  03/07/2020, CONSULTATION DATE:  03/07/20 REFERRING MD:  Dr Roxanne Mins MD, CHIEF COMPLAINT:  Cardiac arrest  Brief History   64 year old with no significant past medical history.  Had acute shortness of breath with sats in the 40s on EMS arrival.  He had a witnessed bradycardic, asystolic arrest when EMS was present with 3 rounds of epi, 20 minutes to ROSC. Underwent normothermia protocol.  Work-up significant for massive PE, DVT with RV strain, GI bleed from gastric ulcer.  Past Medical History  Has not seen a doctor.  No past medical history. Smokes 1 pack/day, drinks 2 to 3 40 ounce beers every day.  Smokes marijuana.  Denies any other recreational drug use.  Significant Hospital Events   3/23 Admit, EGD with findings of oozing gastric ulcer with clot, IR for mechanical thrombectomy, IVC filter and GDA embolization 3/24 Started heparin drip  Consults:  Cardiology, GI, interventional radiology, neurolgoy  Procedures:  ETT 3/23 >>4/5 Trach 4/5 >>changed to cuffless 4/22 L TLC 3/23 >>  ALine L Fem 3/23 >> 3/30  Significant Diagnostic Tests:  CTA 3/23 >> bilateral PE with RV/with ratio 1.9, emphysema CT head 3/23 >> chronic microvascular changes.  No acute findings Echo 3/23 >> RV is severely dilated with reduced function and D-shaped septum, LVEF 60-65%. LE Venous Duplex 3/24 >> acute DVT in BLE up to the femoral vein  MRI Brain 3/28 >> multifocal acute to subacute ischemic infarcts involving bilateral cerebral hemispheres, findings consistent with global hypoperfusion event related to cardiac arrest, associated scattered petechial hemorrhage about many areas of ischemia, evidence of hemorrhagic conversion in the right parietal lobe  Micro Data:  Bcx 3/23 >> negative [name] 3/23 >> negative BCx2 3/23 >> negative   Antimicrobials:    Interim history/subjective:  Restless and agitated   Objective     Blood pressure 129/84, pulse (Abnormal) 116, temperature 98.6 F (37 C), temperature source Axillary, resp. rate (Abnormal) 21, height 6\' 2"  (1.88 m), weight 70.3 kg, SpO2 100 %.    FiO2 (%):  [28 %] 28 %   Intake/Output Summary (Last 24 hours) at 04/06/2020 0828 Last data filed at 04/06/2020 0437 Gross per 24 hour  Intake no documentation  Output 501 ml  Net -501 ml   Filed Weights   03/23/20 0342 03/25/20 0500 03/27/20 0500  Weight: 77.8 kg 77.7 kg 70.3 kg    Examination:  General this is a thin agitated and restless 64 year old black male. He remains on 28% ATC. Now w/ #6 cuffless trach w/ thin bloody secretions s/p change. He is in no distress HENT NCAT. The trach is now 6 cuffless; copious oral secretions  Pulm scattered rhonchi no accessory use. Card RRR tachycardic  abd not tender Ext warm and dry no edema Neuro awake. Interactive but very confused. Moves all extremities.  gu condom cath in place.   Resolved Hospital Problem list   Hypoglycemia Transaminitis  Assessment & Plan:   Tracheostomy dependent s/p Cardiac Arrest secondary to Massive PE s/p IVC filter Anoxic brain injury CVAs w/ hemorrhagic conversion Expressive aphasia  S/p massive UGI bleed requiring GDA embolization   Discussion Interval SLP review: tolerating PMV no evidence of back pressure; some cough w/ meals. Rt had attempted to change trach as requested on 4/19 but patient attempted to punch her. I changed it today   Plan Cont routine trach care Encourage PMV as able Cont to  work w/ SLP I am hopeful we can down-size him in next week or so BUT would like to see secretions down. I do think we should work Towards trach removal if we can as I'm not convinced he is a good long term trach candidate.  Will add low dose robinul for his oral secretions.   Will see him again on Monday-->call if needed sooner.   Erick Colace ACNP-BC [place] Pager # [phone] OR # [phone]  if no answer

## 2020-04-06 NOTE — Procedures (Signed)
Tracheostomy tube change: Marland Kitchen Verbal timeout was performed prior to the procedure. The old  #6 cuffed trach was carefully removed. the tracheostomy site appeared: unremarkable. A new #  6 Cuffless trach was easily placed in the tracheostomy stoma and secured with velcro trach ties. The tracheostomy was patent, good color change observed via EZ-CAP, and the patient was easily able to voice with finger occlusion and tolerated the procedure well with no immediate complications.   Erick Colace ACNP-BC [place] Pager # [phone] OR # [phone] if no answer

## 2020-04-06 NOTE — Progress Notes (Signed)
PROGRESS NOTE  Kenneth Sullivan  DOB: July 01, 1956  PCP: [facility], [name] [id number]  DOA: 03/07/2020  LOS: 30 days   Chief Complaint  Patient presents with  . Cardiac Arrest   Brief narrative: 64 year old with no significant past medical history came into the hospital with shortness of breath, was hypoxic, had a witnessed bradycardic event lead into asystole/cardiac arrest.  Was found to have significantly massive PE/DVT and RV strain, also was found to have an acute GI bleed from a gastric ulcer.  He underwent IR guided mechanical thrombectomy, IVC filter and GDA embolization.  Also underwent an EGD which showed oozing gastric ulcer with clot.  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  Currently has a trach in place and a PEG tube placement is pending, there is a Producer, television/film/video apparently and IR is waiting for shipment.  TOC on board for SNF placement.   Subjective: Patient was seen and examined this morning.  Middle-aged African-American male.  Looks older for his age.  Opens eyes on verbal command.  Has a core track in place.  Per RN, it is clogged and is pending evaluation by the team.   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  -MRI of the brain showed multiple acute to subacute infarcts involving bilateral cerebral hemisphere consistent with global hypoperfusion related to cardiac arrest.  Underwent IR guided mechanical thrombectomy, IVC filter.  -MRI also showed evidence of hemorrhagic conversion of his infarcts.  -Overall poor prognosis,  palliative care consulted, he is DNR but we are to continue aggressive medical management including PEG tube as well as placement.  Still waiting on IR for PEG tube placement, currently is on backorder nationally.  I checked with IR today.  It is unclear at this time when the PEG tube will be available. -LTAC was considered however, patient does not have benefits for LTAC. -Patient will wait in the hospital to get PEG tube placed before being a candidate for discharge to SNF.  Acute respiratory failure with hypoxia -due to PE, he required intubation and eventually ended up having trach on 4/5.   -Currently on trach collar but continues to have copious and thick thick secretions, he is getting scopolamine patch. PCCM recommends #4 cuffless trach once stoma is more mature, with at least 14 days.   -Patient had an event on 4/11 was found on the floor and trach was dislodged.  Respiratory status is stable, pulmonology following to change trach tube site.  Tracheobronchitis-tracheal aspirate sent for cultures, it showed normal respiratory flora, he has completed 7 days of doxycycline with end date 4/17.  Monitor clinically, repeat chest x-ray as above  Leukocytosis-monitor for infections, he is afebrile, possibly due to steroids and change in of the prednisone, will need to taper  Acute blood loss anemia due to acute GI bleed secondary to large gastric ulcer-GI consulted, underwent an EGD and eventually requiring GDA embolization.  Continue PPI.  Hemoglobin stable, monitor periodically  Toothache-patient complained of a severe tooth ache at some point, history is difficult to obtain due to tracheostomy status, have obtained a CT scan of the maxillofacial which showed chronic left TMJ degeneration without superimposed  acute abnormalities or acute inflammatory processes.  Symptomatic management  Agitation/ICU delirium-as needed Haldol/Ativan  Dysphagia -speech evaluated, currently on dysphagia 1  with thin liquids however unlikely that he would meet his nutritional needs.  PEG tube placement pending, apparently there is a Producer, television/film/video and IR will place one when stock will be renewed.  Having a core track temporarily.  Dysphagia 1 diet recommended today.  Persistent hypokalemia /hyperkalemia today-refuses lab intermittently, for some reason his potassium went up to 6.6, he is not on potassium supplementations nor ACE/ARB, possibly due to tube feeds (?).  Given Lokelma and temporizing measures, monitor today serial potassium levels  Mobility: Bedbound Code Status:  DNR  DVT prophylaxis:  SCDs Antimicrobials:  Currently not on antibiotics Fluid: Dextrose at 50 mils per hour Diet: Dysphagia 1 diet  Consultants: Palliative care, PCCM signed off Family Communication:  None at bedside  Status is: Inpatient  Remains inpatient appropriate because:Inpatient level of care appropriate due to severity of illness   Dispo: The patient is from: Home              Anticipated d/c is to: SNF              Anticipated d/c date is: Unclear at this time because unable to get a PEG tube in place.              Patient currently is not medically stable to d/c.   Antimicrobials: Anti-infectives (From admission, onward)   Start     Dose/Rate Route Frequency Ordered Stop   03/26/20 1100  doxycycline (VIBRAMYCIN) 100 mg in sodium chloride 0.9 % 250 mL IVPB  Status:  Discontinued     100 mg 125 mL/hr over 120 Minutes Intravenous 2 times daily 03/26/20 1002 04/01/20 1035   03/25/20 1100  doxycycline (VIBRA-TABS) tablet 100 mg  Status:  Discontinued     100 mg Per Tube Every 12 hours 03/25/20 1009 03/26/20 1002   03/25/20 1015  doxycycline (VIBRAMYCIN) 100 mg in sodium chloride 0.9 % 250 mL IVPB  Status:  Discontinued    Note to Pharmacy: To be given after the tracheal aspirate is collected.   100 mg 125 mL/hr over 120 Minutes Intravenous Every 12 hours 03/25/20 1005 03/25/20 1008        Code  Status: DNR   Diet Order            DIET - DYS 1 Room service appropriate? Yes; Fluid consistency: Thin  Diet effective now              Infusions:  . sodium chloride    . dextrose 50 mL/hr at 04/05/20 2348  . feeding supplement (JEVITY 1.2 CAL) 1,000 mL (04/05/20 0546)    Scheduled Meds: . chlorhexidine gluconate (MEDLINE KIT)  15 mL Mouth Rinse BID  . Chlorhexidine Gluconate Cloth  6 each Topical Q0600  . feeding supplement (PRO-STAT SUGAR FREE 64)  30 mL Per Tube Daily  . free water  200 mL Per Tube Q6H  . glycopyrrolate  1 mg Per Tube TID  . mouth rinse  15 mL Mouth Rinse 10 times per day  . pantoprazole sodium  40 mg Per Tube BID  . predniSONE  20 mg Oral Q breakfast  . QUEtiapine  12.5 mg Per Tube QHS  . scopolamine  1 patch Transdermal Q72H  . sodium chloride flush  10-40 mL Intracatheter Q12H  . sodium chloride flush  10-40 mL Intracatheter Q12H    PRN meds:  sodium chloride, acetaminophen, dextrose, docusate sodium, gelatin adsorbable, guaiFENesin-dextromethorphan, haloperidol lactate, hydrALAZINE, ipratropium-albuterol, LORazepam, metoprolol tartrate, sodium chloride flush   Objective: Vitals:   04/06/20 1042 04/06/20 1100  BP:  113/83  Pulse: (!) 110 97  Resp:  (!) 21  Temp:  97.7 F (36.5 C)  SpO2:  99%    Intake/Output Summary (Last 24 hours) at 04/06/2020 1152 Last data filed at 04/06/2020 0437 Gross per 24 hour  Intake --  Output 501 ml  Net -501 ml   Filed Weights   03/23/20 0342 03/25/20 0500 03/27/20 0500  Weight: 77.8 kg 77.7 kg 70.3 kg   Weight change:  Body mass index is 19.9 kg/m.   Physical Exam: General exam: Not in physical discomfort.  Not a lot of secretions. Skin: No rashes, lesions or ulcers. HEENT: Atraumatic, normocephalic, supple neck, no obvious bleeding. Copious amount of secretions Lungs: Clear to auscultation bilaterally CVS: Regular rate and rhythm, no murmur GI/Abd soft, nontender, nondistended, bowel sound  present CNS: Opens eyes on verbal command.  Unable to follow commands. Psychiatry: Unable to assess mood Extremities: No pedal edema, no calf tenderness  Data Review: I have personally reviewed the laboratory data and studies available.  Recent Labs  Lab 04/02/20 1014 04/03/20 1406 04/04/20 0205 04/05/20 0657  WBC 15.6* 18.6* 20.2* 15.2*  HGB 11.8* 12.5* 11.6* 11.7*  HCT 35.4* 37.6* 34.5* 35.3*  MCV 92.2 92.8 92.5 92.2  PLT 381 391 336 358   Recent Labs  Lab 03/31/20 0727 03/31/20 1630 04/02/20 1014 04/02/20 1014 04/03/20 1406 04/03/20 1406 04/04/20 0143 04/04/20 0205 04/04/20 0425 04/04/20 1128 04/04/20 1554 04/05/20 0657 04/05/20 1107 04/06/20 0232  NA 139  --  139  --  138  --   --  137  --   --   --  138  --   --   K 3.7  --  4.1   < > 5.4*   < >  --  6.6*   < > 4.5 5.4* 5.0 4.1 4.1  CL 101  --  102  --  100  --   --  101  --   --   --  97*  --   --   CO2 25  --  28  --  27  --   --  25  --   --   --  29  --   --   GLUCOSE 117*  --  108*  --  119*  --   --  119*  --   --   --  103*  --   --   BUN 7*  --  13  --  13  --   --  14  --   --   --  19  --   --   CREATININE 0.74  --  0.71  --  0.66  --   --  0.67  --   --   --  0.76  --   --   CALCIUM 8.8*  --  8.6*  --  9.3  --   --  9.1  --   --   --  9.2  --   --   MG  --   --   --   --   --   --   --   --   --   --   --  2.3  --   --   PHOS 3.8   < >  3.3   < > 3.4  --  5.2*  --   --   --  4.9* 5.1*  --  4.2   < > = values in this interval not displayed.    Signed, Terrilee Croak, MD [facility] Pager: [phone] (Secure Chat preferred). 04/06/2020

## 2020-04-07 LAB — GLUCOSE, CAPILLARY
Glucose-Capillary: 92 mg/dL (ref 70–99)
Glucose-Capillary: 87 mg/dL (ref 70–99)
Glucose-Capillary: 123 mg/dL — ABNORMAL HIGH (ref 70–99)
Glucose-Capillary: 117 mg/dL — ABNORMAL HIGH (ref 70–99)
Glucose-Capillary: 103 mg/dL — ABNORMAL HIGH (ref 70–99)
Glucose-Capillary: 130 mg/dL — ABNORMAL HIGH (ref 70–99)

## 2020-04-07 LAB — BASIC METABOLIC PANEL
Anion gap: 10 (ref 5–15)
BUN: 16 mg/dL (ref 8–23)
CO2: 28 mmol/L (ref 22–32)
Calcium: 9.3 mg/dL (ref 8.9–10.3)
Chloride: 99 mmol/L (ref 98–111)
Creatinine, Ser: 0.78 mg/dL (ref 0.61–1.24)
GFR calc Af Amer: >60 mL/min (ref >60)
GFR calc non Af Amer: >60 mL/min (ref >60)
Glucose, Bld: 107 mg/dL — ABNORMAL HIGH (ref 70–99)
Potassium: 4.7 mmol/L (ref 3.5–5.1)
Sodium: 137 mmol/L (ref 135–145)

## 2020-04-07 LAB — CBC WITH DIFFERENTIAL/PLATELET
Abs Immature Granulocytes: 0.08 10*3/uL — ABNORMAL HIGH (ref 0.00–0.07)
Basophils Absolute: 0 10*3/uL (ref 0.0–0.1)
Basophils Relative: 0 %
Eosinophils Absolute: 0.1 10*3/uL (ref 0.0–0.5)
Eosinophils Relative: 1 %
HCT: 36.6 % — ABNORMAL LOW (ref 39.0–52.0)
Hemoglobin: 12.3 g/dL — ABNORMAL LOW (ref 13.0–17.0)
Immature Granulocytes: 1 %
Lymphocytes Relative: 10 %
Lymphs Abs: 1.3 10*3/uL (ref 0.7–4.0)
MCH: 30.6 pg (ref 26.0–34.0)
MCHC: 33.6 g/dL (ref 30.0–36.0)
MCV: 91 fL (ref 80.0–100.0)
Monocytes Absolute: 0.6 10*3/uL (ref 0.1–1.0)
Monocytes Relative: 5 %
Neutro Abs: 11 10*3/uL — ABNORMAL HIGH (ref 1.7–7.7)
Neutrophils Relative %: 83 %
Platelets: 315 10*3/uL (ref 150–400)
RBC: 4.02 MIL/uL — ABNORMAL LOW (ref 4.22–5.81)
RDW: 17.5 % — ABNORMAL HIGH (ref 11.5–15.5)
WBC: 13.1 10*3/uL — ABNORMAL HIGH (ref 4.0–10.5)
nRBC: 0 % (ref 0.0–0.2)

## 2020-04-07 LAB — MAGNESIUM: Magnesium: 2.4 mg/dL (ref 1.7–2.4)

## 2020-04-07 LAB — PHOSPHORUS
Phosphorus: 4.1 mg/dL (ref 2.5–4.6)
Phosphorus: 4 mg/dL (ref 2.5–4.6)

## 2020-04-07 NOTE — Progress Notes (Signed)
Cortrak Team   Consult received concerning clogged Cortrak. Cortrak placed in [facility] on 4/16 and became clogged on 4/21.   Able to talk with RN a short time ago and she confirms that tube was replaced by IR/Diagnostics 4/22, bridle in place, and that medications were given without issue earlier this AM.   Patient is pending PEG and then SNF placement; current backorder on PEG tubes. He is also ordered a Dysphagia 1, thin liquids diet and consumed 25% of breakfast this AM.      Jarome Matin, MS, RD, LDN, CNSC Inpatient Clinical Dietitian RD pager # available in [facility]  After hours/weekend pager # available in [facility]

## 2020-04-07 NOTE — Progress Notes (Signed)
IR aware of request for gastrostomy placement, as noted patient is approved for procedure however we are still waiting for shipment of gastrostomy tubes to arrive. We will notify primary team once tubes have arrived to plan for procedure.   Please call IR with questions or concerns.  Laquenta Whitsell, PA-C 

## 2020-04-07 NOTE — Progress Notes (Signed)
MEWS : patient fluctuates between green and yellow because of his heart rate.  He runs between high 90's to around 115 for his heart rate.  This is not a new change for him.

## 2020-04-07 NOTE — Progress Notes (Signed)
PROGRESS NOTE  Kenneth Sullivan  DOB: 1956/04/10  PCP: [name] [id number]  DOA: 03/07/2020  LOS: 31 days   Chief Complaint  Patient presents with  . Cardiac Arrest   Brief narrative: Patient is a 64 year old with unknown medical history, not seen a physician for several years.   He presented to the ED on 3/23 after an acute onset shortness of breath with oxygen saturation on 40% by EMS. On site, he had witnessed bradycardic event leading to asystole/cardiac arrest.  ROSC achieved in 20 minutes with 3 rounds of epinephrine.  Brought to ED intubated.  Admitted to ICU. CTPA showed significantly massive PE/DVT and RV strain.  He underwent IR guided mechanical thrombectomy and IVC filter. Postprocedure, while on heparin drip, patient started having maroon-colored stool and bloody drainage from OGT.  GI did EGD which showed oozing gastric ulcer with clot. IR did GDA embolization Patient remained encephalopathic.  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  4/5, underwent tracheostomy.  PEG tube placement is planned but it is in Producer, television/film/video.  Unclear when it will be available.  Patient does not have LTAC benefits. Once PEG tube is placed, patient will be discharged to SNF.  Subjective: Patient was seen and examined this morning. Lying on bed.  Not in distress.  Alert, awake, unable to follow commands.  Unable to have a communication.  Assessment/Plan: Massive pulmonary embolism leading to sudden cardiac arrest leading to anoxic brain injury -s/p IR guided mechanical thrombectomy, IVC filter.  -Not on anticoagulation because of associated GI bleed and brain bleed.  Acute vent dependent respiratory failure with  hypoxia Secondary to anoxic brain injury -Remains on trach collar since 4/5 -pulmonology following to change trach tube site.  Acute GI bleeding  Acute blood loss anemia -No GI bleeding prior to admission.  Post thrombectomy and heparinization, patient started to have melena and fresh blood in OGT.   -EGD showed due to acute GI bleed secondary to large gastric ulcer. -IR did GDA embolization.  Continue PPI.  Hemoglobin stable, monitor periodically  Acute encephalopathy Anoxic brain injury -Eyes open, limited communication. Unable to follow commands. -Per neurology, unlikely to have meaningful neurological recovery. -Episodes of agitation requiring Haldol/Ativan as needed  Dysphagia  -Tends to have copious secretions out of trach collar and mouth.   -Speech evaluation appreciated. currently on dysphagia 1 with thin liquids however unlikely that he would meet his nutritional needs.   -PEG tube planned but in national shortage.  Currently has a core track in place.  Leukocytosis -No fever, has copious secretions no evidence of aspiration at this time.   -On prednisone in this admission.,  Unclear indication. -Continue to taper.  Currently at 20 mg daily.  -WBC count improving, 13.1 today.  Mobility: Bedbound Code Status:  DNR  DVT prophylaxis:  SCDs Antimicrobials:  Currently not on antibiotics Fluid: Dextrose at 50 mils per hour to prevent hypoglycemia Diet: Dysphagia 1 diet.  Core track in place  Consultants: Palliative care following as well.  Specialties signed off. Family Communication:  None at bedside  Status is: Inpatient  Remains inpatient appropriate because:Inpatient level of care appropriate due to severity of illness   Dispo: The patient is from: Home              Anticipated d/c  is to: SNF              Anticipated d/c date is: Unclear at this time because unable to get a PEG tube in place because of national shortage              Patient currently is not  medically stable to d/c.   Antimicrobials: Anti-infectives (From admission, onward)   Start     Dose/Rate Route Frequency Ordered Stop   03/26/20 1100  doxycycline (VIBRAMYCIN) 100 mg in sodium chloride 0.9 % 250 mL IVPB  Status:  Discontinued     100 mg 125 mL/hr over 120 Minutes Intravenous 2 times daily 03/26/20 1002 04/01/20 1035   03/25/20 1100  doxycycline (VIBRA-TABS) tablet 100 mg  Status:  Discontinued     100 mg Per Tube Every 12 hours 03/25/20 1009 03/26/20 1002   03/25/20 1015  doxycycline (VIBRAMYCIN) 100 mg in sodium chloride 0.9 % 250 mL IVPB  Status:  Discontinued    Note to Pharmacy: To be given after the tracheal aspirate is collected.   100 mg 125 mL/hr over 120 Minutes Intravenous Every 12 hours 03/25/20 1005 03/25/20 1008        Code Status: DNR   Diet Order            DIET - DYS 1 Room service appropriate? Yes; Fluid consistency: Thin  Diet effective now              Infusions:  . sodium chloride    . dextrose 50 mL/hr at 04/05/20 2348  . feeding supplement (JEVITY 1.2 CAL) 1,000 mL (04/05/20 0546)    Scheduled Meds: . chlorhexidine gluconate (MEDLINE KIT)  15 mL Mouth Rinse BID  . Chlorhexidine Gluconate Cloth  6 each Topical Q0600  . feeding supplement (PRO-STAT SUGAR FREE 64)  30 mL Per Tube Daily  . free water  200 mL Per Tube Q6H  . glycopyrrolate  1 mg Per Tube TID  . mouth rinse  15 mL Mouth Rinse 10 times per day  . pantoprazole sodium  40 mg Per Tube BID  . predniSONE  20 mg Oral Q breakfast  . QUEtiapine  12.5 mg Per Tube QHS  . scopolamine  1 patch Transdermal Q72H  . sodium chloride flush  10-40 mL Intracatheter Q12H  . sodium chloride flush  10-40 mL Intracatheter Q12H    PRN meds: sodium chloride, acetaminophen, dextrose, docusate sodium, gelatin adsorbable, guaiFENesin-dextromethorphan, haloperidol lactate, hydrALAZINE, ipratropium-albuterol, LORazepam, metoprolol tartrate, sodium chloride flush   Objective: Vitals:    04/07/20 1120 04/07/20 1132  BP: 121/84   Pulse: (!) 110   Resp: (!) 25 20  Temp: 98.4 F (36.9 C)   SpO2: 100% 100%    Intake/Output Summary (Last 24 hours) at 04/07/2020 1337 Last data filed at 04/07/2020 0856 Gross per 24 hour  Intake 1780 ml  Output 1200 ml  Net 580 ml   Filed Weights   03/23/20 0342 03/25/20 0500 03/27/20 0500  Weight: 77.8 kg 77.7 kg 70.3 kg   Weight change:  Body mass index is 19.9 kg/m.   Physical Exam:  General exam: Not in physical discomfort.  A lot of copious secretions skin: No rashes, lesions or ulcers. HEENT: Trach tube anteriorly. Lungs: Clear to auscultation bilaterally CVS: Regular rate and rhythm, no murmur GI/Abd soft, nontender, nondistended, bowel sound present CNS: Eyes open.  Unable to follow commands.   Psychiatry: Unable to assess mood Extremities: No pedal edema, no calf tenderness  Data Review: I have personally reviewed the laboratory data and studies available.  Recent Labs  Lab 04/02/20 1014 04/03/20 1406 04/04/20 0205 04/05/20 0657 04/07/20 0503  WBC 15.6* 18.6* 20.2* 15.2* 13.1*  NEUTROABS  --   --   --   --  11.0*  HGB 11.8* 12.5* 11.6* 11.7* 12.3*  HCT 35.4* 37.6* 34.5* 35.3* 36.6*  MCV 92.2 92.8 92.5 92.2 91.0  PLT 381 391 336 358 315   Recent Labs  Lab 04/02/20 1014 04/02/20 1014 04/03/20 1406 04/04/20 0143 04/04/20 0205 04/04/20 0425 04/04/20 1554 04/05/20 0657 04/05/20 1107 04/06/20 0232 04/06/20 1656 04/07/20 0503  NA 139  --  138  --  137  --   --  138  --   --   --  137  K 4.1   < > 5.4*  --  6.6*   < > 5.4* 5.0 4.1 4.1  --  4.7  CL 102  --  100  --  101  --   --  97*  --   --   --  99  CO2 28  --  27  --  25  --   --  29  --   --   --  28  GLUCOSE 108*  --  119*  --  119*  --   --  103*  --   --   --  107*  BUN 13  --  13  --  14  --   --  19  --   --   --  16  CREATININE 0.71  --  0.66  --  0.67  --   --  0.76  --   --   --  0.78  CALCIUM 8.6*  --  9.3  --  9.1  --   --  9.2  --   --    --  9.3  MG  --   --   --   --   --   --   --  2.3  --   --   --  2.4  PHOS 3.3   < > 3.4   < >  --    < > 4.9* 5.1*  --  4.2 3.8 4.1   < > = values in this interval not displayed.    Signed, Terrilee Croak, MD [facility] Pager: [phone] (Secure Chat preferred). 04/07/2020

## 2020-04-07 NOTE — Progress Notes (Signed)
Patient's assessment has not changed since prior assessment. The patient goes back and forth between yellow and green for his heart rate and MD is aware.

## 2020-04-08 LAB — GLUCOSE, CAPILLARY
Glucose-Capillary: 108 mg/dL — ABNORMAL HIGH (ref 70–99)
Glucose-Capillary: 112 mg/dL — ABNORMAL HIGH (ref 70–99)
Glucose-Capillary: 126 mg/dL — ABNORMAL HIGH (ref 70–99)
Glucose-Capillary: 137 mg/dL — ABNORMAL HIGH (ref 70–99)
Glucose-Capillary: 95 mg/dL (ref 70–99)
Glucose-Capillary: 96 mg/dL (ref 70–99)

## 2020-04-08 LAB — PHOSPHORUS
Phosphorus: 4.4 mg/dL (ref 2.5–4.6)
Phosphorus: 3.8 mg/dL (ref 2.5–4.6)

## 2020-04-08 NOTE — Progress Notes (Signed)
PROGRESS NOTE  Kenneth Sullivan  DOB: [birth year]  PCP: Medicine, [name] [id number]  DOA: 03/07/2020  LOS: 32 days   Chief Complaint  Patient presents with  . Cardiac Arrest   Brief narrative: Patient is a 64 year old with unknown medical history, not seen a physician for several years.   He presented to the ED on 3/23 after an acute onset shortness of breath with oxygen saturation on 40% by EMS. On site, he had witnessed bradycardic event leading to asystole/cardiac arrest.  ROSC achieved in 20 minutes with 3 rounds of epinephrine.  Brought to ED intubated.  Admitted to ICU. CTPA showed significantly massive PE/DVT and RV strain.  He underwent IR guided mechanical thrombectomy and IVC filter. Postprocedure, while on heparin drip, patient started having maroon-colored stool and bloody drainage from OGT.  GI did EGD which showed oozing gastric ulcer with clot. IR did GDA embolization Patient remained encephalopathic.  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  4/5, underwent tracheostomy.  PEG tube placement is planned but it is in Producer, television/film/video.  Unclear when it will be available.  Patient does not have LTAC benefits. Once PEG tube is placed, patient will be discharged to SNF.  Subjective: Patient was seen and examined this morning. Lying on bed.  He was restless today when I saw him.  He said he wanted to be.  I reminded him that he has a Foley catheter and he can just let it go.  He verbalized to me that, ' no I cannot just let it go.'  For the first time I heard him say a sentence.  Assessment/Plan: Massive pulmonary embolism leading to sudden cardiac arrest leading to anoxic brain injury -s/p IR guided  mechanical thrombectomy, IVC filter.  -Not on anticoagulation because of associated GI bleed and brain bleed.  Acute vent dependent respiratory failure with hypoxia Secondary to anoxic brain injury -Remains on trach collar since 4/5 -pulmonology following to change trach tube site.  Acute GI bleeding  Acute blood loss anemia -No GI bleeding prior to admission. Post thrombectomy and heparinization, patient started to have melena and fresh blood in OGT.   -EGD showed acute GI bleed secondary to large gastric ulcer. -IR did GDA embolization. Continue PPI. Hemoglobin stable, monitor periodically  Acute encephalopathy Anoxic brain injury -Today the first I heard him say a sentence.   -Still minimal chance of meaningful neurologic recovery.  -Episodes of agitation requiring Haldol/Ativan as needed  Dysphagia  -Tends to have copious secretions out of trach collar and mouth.   -Speech evaluation appreciated. currently on dysphagia 1 with thin liquids however unlikely that he would meet his nutritional needs.   -PEG tube planned but in national shortage.  Currently has a core track in place.  Leukocytosis -No fever, has copious secretions no evidence of aspiration at this time.   -On prednisone in this admission.,  Unclear indication. -Continue to taper.  Currently at 20 mg daily.  -WBC count improving, 13.1 on last check on 4/23.  Mobility: Bedbound Code Status:  DNR  DVT prophylaxis:  SCDs Antimicrobials:  Currently not on antibiotics Fluid: Dextrose at 50 mils per hour to prevent hypoglycemia Diet: Dysphagia 1 diet.  Core track in place  Consultants: Palliative care following as well.  Specialties signed off. Family Communication:  None  at bedside  Status is: Inpatient  Remains inpatient appropriate because:Inpatient level of care appropriate due to severity of illness   Dispo: The patient is from: Home              Anticipated d/c is to: SNF              Anticipated d/c  date is: Unclear at this time because unable to get a PEG tube in place because of national shortage              Patient currently is not medically stable to d/c.   Antimicrobials: Anti-infectives (From admission, onward)   Start     Dose/Rate Route Frequency Ordered Stop   03/26/20 1100  doxycycline (VIBRAMYCIN) 100 mg in sodium chloride 0.9 % 250 mL IVPB  Status:  Discontinued     100 mg 125 mL/hr over 120 Minutes Intravenous 2 times daily 03/26/20 1002 04/01/20 1035   03/25/20 1100  doxycycline (VIBRA-TABS) tablet 100 mg  Status:  Discontinued     100 mg Per Tube Every 12 hours 03/25/20 1009 03/26/20 1002   03/25/20 1015  doxycycline (VIBRAMYCIN) 100 mg in sodium chloride 0.9 % 250 mL IVPB  Status:  Discontinued    Note to Pharmacy: To be given after the tracheal aspirate is collected.   100 mg 125 mL/hr over 120 Minutes Intravenous Every 12 hours 03/25/20 1005 03/25/20 1008        Code Status: DNR   Diet Order            DIET - DYS 1 Room service appropriate? Yes; Fluid consistency: Thin  Diet effective now              Infusions:  . sodium chloride    . dextrose 50 mL/hr at 04/05/20 2348  . feeding supplement (JEVITY 1.2 CAL) 1,000 mL (04/08/20 1124)    Scheduled Meds: . chlorhexidine gluconate (MEDLINE KIT)  15 mL Mouth Rinse BID  . Chlorhexidine Gluconate Cloth  6 each Topical Q0600  . feeding supplement (PRO-STAT SUGAR FREE 64)  30 mL Per Tube Daily  . free water  200 mL Per Tube Q6H  . glycopyrrolate  1 mg Per Tube TID  . mouth rinse  15 mL Mouth Rinse 10 times per day  . pantoprazole sodium  40 mg Per Tube BID  . predniSONE  20 mg Oral Q breakfast  . QUEtiapine  12.5 mg Per Tube QHS  . scopolamine  1 patch Transdermal Q72H  . sodium chloride flush  10-40 mL Intracatheter Q12H  . sodium chloride flush  10-40 mL Intracatheter Q12H    PRN meds: sodium chloride, acetaminophen, dextrose, docusate sodium, gelatin adsorbable, guaiFENesin-dextromethorphan,  haloperidol lactate, hydrALAZINE, ipratropium-albuterol, LORazepam, metoprolol tartrate, sodium chloride flush   Objective: Vitals:   04/08/20 1129 04/08/20 1254  BP:  (!) 126/96  Pulse: (!) 118   Resp: 20 20  Temp:  97.9 F (36.6 C)  SpO2: 97% 97%    Intake/Output Summary (Last 24 hours) at 04/08/2020 1351 Last data filed at 04/08/2020 0700 Gross per 24 hour  Intake 1905 ml  Output 900 ml  Net 1005 ml   Filed Weights   03/23/20 0342 03/25/20 0500 03/27/20 0500  Weight: 77.8 kg 77.7 kg 70.3 kg   Weight change:  Body mass index is 19.9 kg/m.   Physical Exam:  General exam: Not in physical discomfort.  A lot of copious secretions skin: No rashes, lesions or ulcers. HEENT: Trach tube  anteriorly. Lungs: Clear to auscultation bilaterally CVS: Regular rate and rhythm, no murmur GI/Abd soft, nontender, nondistended, bowel sound present CNS: Eyes open.  Unable to follow commands.   Psychiatry: Unable to assess mood Extremities: No pedal edema, no calf tenderness  Data Review: I have personally reviewed the laboratory data and studies available.  Recent Labs  Lab 04/02/20 1014 04/03/20 1406 04/04/20 0205 04/05/20 0657 04/07/20 0503  WBC 15.6* 18.6* 20.2* 15.2* 13.1*  NEUTROABS  --   --   --   --  11.0*  HGB 11.8* 12.5* 11.6* 11.7* 12.3*  HCT 35.4* 37.6* 34.5* 35.3* 36.6*  MCV 92.2 92.8 92.5 92.2 91.0  PLT 381 391 336 358 315   Recent Labs  Lab 04/02/20 1014 04/02/20 1014 04/03/20 1406 04/04/20 0143 04/04/20 0205 04/04/20 0425 04/04/20 1554 04/04/20 1554 04/05/20 0657 04/05/20 0657 04/05/20 1107 04/06/20 0232 04/06/20 1656 04/07/20 0503 04/07/20 1640 04/08/20 0548  NA 139  --  138  --  137  --   --   --  138  --   --   --   --  137  --   --   K 4.1   < > 5.4*  --  6.6*   < > 5.4*  --  5.0  --  4.1 4.1  --  4.7  --   --   CL 102  --  100  --  101  --   --   --  97*  --   --   --   --  99  --   --   CO2 28  --  27  --  25  --   --   --  29  --   --   --    --  28  --   --   GLUCOSE 108*  --  119*  --  119*  --   --   --  103*  --   --   --   --  107*  --   --   BUN 13  --  13  --  14  --   --   --  19  --   --   --   --  16  --   --   CREATININE 0.71  --  0.66  --  0.67  --   --   --  0.76  --   --   --   --  0.78  --   --   CALCIUM 8.6*  --  9.3  --  9.1  --   --   --  9.2  --   --   --   --  9.3  --   --   MG  --   --   --   --   --   --   --   --  2.3  --   --   --   --  2.4  --   --   PHOS 3.3   < > 3.4   < >  --    < > 4.9*   < > 5.1*   < >  --  4.2 3.8 4.1 4.0 4.4   < > = values in this interval not displayed.    Signed, Terrilee Croak, MD [facility] Pager: [phone] (Secure Chat preferred). 04/08/2020

## 2020-04-08 NOTE — Progress Notes (Signed)
Patient assessment and vitals unchanged. He gets agitated and goes in and out of yellow and greens mews.

## 2020-04-09 LAB — CBC WITH DIFFERENTIAL/PLATELET
Abs Immature Granulocytes: 0.05 10*3/uL (ref 0.00–0.07)
Basophils Absolute: 0 10*3/uL (ref 0.0–0.1)
Basophils Relative: 0 %
Eosinophils Absolute: 0.1 10*3/uL (ref 0.0–0.5)
Eosinophils Relative: 1 %
HCT: 38.5 % — ABNORMAL LOW (ref 39.0–52.0)
Hemoglobin: 12.2 g/dL — ABNORMAL LOW (ref 13.0–17.0)
Immature Granulocytes: 0 %
Lymphocytes Relative: 4 %
Lymphs Abs: 0.6 10*3/uL — ABNORMAL LOW (ref 0.7–4.0)
MCH: 30 pg (ref 26.0–34.0)
MCHC: 31.7 g/dL (ref 30.0–36.0)
MCV: 94.8 fL (ref 80.0–100.0)
Monocytes Absolute: 0.5 10*3/uL (ref 0.1–1.0)
Monocytes Relative: 4 %
Neutro Abs: 12.1 10*3/uL — ABNORMAL HIGH (ref 1.7–7.7)
Neutrophils Relative %: 91 %
Platelets: 246 10*3/uL (ref 150–400)
RBC: 4.06 MIL/uL — ABNORMAL LOW (ref 4.22–5.81)
RDW: 18.3 % — ABNORMAL HIGH (ref 11.5–15.5)
WBC: 13.4 10*3/uL — ABNORMAL HIGH (ref 4.0–10.5)
nRBC: 0 % (ref 0.0–0.2)

## 2020-04-09 LAB — BASIC METABOLIC PANEL
Anion gap: 11 (ref 5–15)
BUN: 24 mg/dL — ABNORMAL HIGH (ref 8–23)
CO2: 22 mmol/L (ref 22–32)
Calcium: 8.7 mg/dL — ABNORMAL LOW (ref 8.9–10.3)
Chloride: 102 mmol/L (ref 98–111)
Creatinine, Ser: 0.76 mg/dL (ref 0.61–1.24)
GFR calc Af Amer: >60 mL/min (ref >60)
GFR calc non Af Amer: >60 mL/min (ref >60)
Glucose, Bld: 129 mg/dL — ABNORMAL HIGH (ref 70–99)
Potassium: 4.8 mmol/L (ref 3.5–5.1)
Sodium: 135 mmol/L (ref 135–145)

## 2020-04-09 LAB — GLUCOSE, CAPILLARY
Glucose-Capillary: 75 mg/dL (ref 70–99)
Glucose-Capillary: 100 mg/dL — ABNORMAL HIGH (ref 70–99)
Glucose-Capillary: 100 mg/dL — ABNORMAL HIGH (ref 70–99)
Glucose-Capillary: 111 mg/dL — ABNORMAL HIGH (ref 70–99)
Glucose-Capillary: 89 mg/dL (ref 70–99)
Glucose-Capillary: 82 mg/dL (ref 70–99)

## 2020-04-09 LAB — PHOSPHORUS
Phosphorus: 3.1 mg/dL (ref 2.5–4.6)
Phosphorus: 3.1 mg/dL (ref 2.5–4.6)

## 2020-04-09 LAB — MAGNESIUM: Magnesium: 2.2 mg/dL (ref 1.7–2.4)

## 2020-04-09 NOTE — Plan of Care (Signed)
  Problem: Activity: Goal: Ability to tolerate increased activity will improve Outcome: Progressing   Problem: Respiratory: Goal: Ability to maintain a clear airway and adequate ventilation will improve Outcome: Progressing   Problem: Role Relationship: Goal: Method of communication will improve Outcome: Progressing   

## 2020-04-09 NOTE — Progress Notes (Signed)
MEWS scores continue to be yellow and red due to hypotention, tachycardia and rapid respiratory rate. Patient is being monitored.   04/09/20 1119  Assess: MEWS Score  BP 91/68  Pulse Rate (!) 106  Resp (!) 29  O2 Device Tracheostomy Collar  O2 Flow Rate (L/min) 5 L/min  FiO2 (%) 21 %  Assess: MEWS Score  MEWS Temp 0  MEWS Systolic 1  MEWS Pulse 1  MEWS RR 2  MEWS LOC 0  MEWS Score 4  MEWS Score Color Red  Assess: if the MEWS score is Yellow or Red  MEWS guidelines implemented *See Row Information* No, previously red, continue vital signs every 4 hours

## 2020-04-09 NOTE — Progress Notes (Signed)
PROGRESS NOTE  Kenneth Sullivan  DOB: [birth year]  PCP: Medicine, [name] [id number]  DOA: 03/07/2020  LOS: 33 days   Chief Complaint  Patient presents with  . Cardiac Arrest   Brief narrative: Patient is a 64 year old with unknown medical history, not seen a physician for several years.   He presented to the ED on 3/23 after an acute onset shortness of breath with oxygen saturation on 40% by EMS. On site, he had witnessed bradycardic event leading to asystole/cardiac arrest.  ROSC achieved in 20 minutes with 3 rounds of epinephrine.  Brought to ED intubated.  Admitted to ICU. CTPA showed significantly massive PE/DVT and RV strain.  He underwent IR guided mechanical thrombectomy and IVC filter. Postprocedure, while on heparin drip, patient started having maroon-colored stool and bloody drainage from OGT.  GI did EGD which showed oozing gastric ulcer with clot. IR did GDA embolization Patient remained encephalopathic.  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  4/5, underwent tracheostomy.  PEG tube placement is planned but it is in Producer, television/film/video.  Unclear when it will be available.  Patient does not have LTAC benefits. Once PEG tube is placed, patient will be discharged to SNF.  Subjective: Patient was seen and examined this afternoon. Lying on bed.  Alert, awake, seems to be making nonpurposeful movement, he has some words and seems like he is trying to make a request but cannot take it clearly consistently.    Assessment/Plan: Massive pulmonary embolism leading to sudden cardiac arrest leading to anoxic brain injury -s/p IR guided mechanical thrombectomy, IVC filter.  -Not on anticoagulation because of associated  GI bleed and brain bleed.  Acute vent dependent respiratory failure with hypoxia Secondary to anoxic brain injury -Remains on trach collar since 4/5 -pulmonology following to change trach tube site.  Acute GI bleeding  Acute blood loss anemia -No GI bleeding prior to admission. Post thrombectomy and heparinization, patient started to have melena and fresh blood in OGT.   -EGD showed acute GI bleed secondary to large gastric ulcer. -IR did GDA embolization. Continue PPI. Hemoglobin stable, monitor periodically  Acute encephalopathy Anoxic brain injury -Alert, awake, inconsistent in nature ability to have a conversation.  Spontaneous movement of all extremities seen. -Episodes of agitation requiring Haldol/Ativan as needed  Dysphagia  -Tends to have copious secretions out of trach collar and mouth.   -Speech evaluation appreciated. currently on dysphagia 1 with thin liquids however unlikely that he would meet his nutritional needs.   -PEG tube planned but in national shortage.  Currently has a core track in place.  Leukocytosis -No fever, has copious secretions no evidence of aspiration at this time.   -On prednisone in this admission.,  Unclear indication. -Continue to taper.  Currently at 20 mg daily.  -WBC count improving, 13.4 today.    Mobility: Bedbound Code Status:  DNR  DVT prophylaxis:  SCDs Antimicrobials:  Currently not on antibiotics Fluid: Dextrose at 50 mils per hour to prevent hypoglycemia Diet: Dysphagia 1 diet.  Core track in place  Consultants: Palliative care following as well.  Specialties signed off. Family Communication:  None at bedside  Status is: Inpatient  Remains inpatient appropriate because:Inpatient level of care appropriate due to severity of illness   Dispo: The patient is from: Home  Anticipated d/c is to: SNF              Anticipated d/c date is: Unclear at this time because unable to get a PEG tube in place because of  national shortage              Patient currently is not medically stable to d/c.   Antimicrobials: Anti-infectives (From admission, onward)   Start     Dose/Rate Route Frequency Ordered Stop   03/26/20 1100  doxycycline (VIBRAMYCIN) 100 mg in sodium chloride 0.9 % 250 mL IVPB  Status:  Discontinued     100 mg 125 mL/hr over 120 Minutes Intravenous 2 times daily 03/26/20 1002 04/01/20 1035   03/25/20 1100  doxycycline (VIBRA-TABS) tablet 100 mg  Status:  Discontinued     100 mg Per Tube Every 12 hours 03/25/20 1009 03/26/20 1002   03/25/20 1015  doxycycline (VIBRAMYCIN) 100 mg in sodium chloride 0.9 % 250 mL IVPB  Status:  Discontinued    Note to Pharmacy: To be given after the tracheal aspirate is collected.   100 mg 125 mL/hr over 120 Minutes Intravenous Every 12 hours 03/25/20 1005 03/25/20 1008        Code Status: DNR   Diet Order            DIET DYS 2 Room service appropriate? Yes with Assist; Fluid consistency: Thin  Diet effective now              Infusions:  . sodium chloride    . dextrose 50 mL/hr at 04/05/20 2348  . feeding supplement (JEVITY 1.2 CAL) 1,000 mL (04/09/20 0600)    Scheduled Meds: . chlorhexidine gluconate (MEDLINE KIT)  15 mL Mouth Rinse BID  . Chlorhexidine Gluconate Cloth  6 each Topical Q0600  . feeding supplement (PRO-STAT SUGAR FREE 64)  30 mL Per Tube Daily  . free water  200 mL Per Tube Q6H  . glycopyrrolate  1 mg Per Tube TID  . pantoprazole sodium  40 mg Per Tube BID  . predniSONE  20 mg Oral Q breakfast  . QUEtiapine  12.5 mg Per Tube QHS  . scopolamine  1 patch Transdermal Q72H  . sodium chloride flush  10-40 mL Intracatheter Q12H  . sodium chloride flush  10-40 mL Intracatheter Q12H    PRN meds: sodium chloride, acetaminophen, dextrose, docusate sodium, gelatin adsorbable, guaiFENesin-dextromethorphan, haloperidol lactate, hydrALAZINE, ipratropium-albuterol, LORazepam, metoprolol tartrate, sodium chloride flush   Objective:  Vitals:   04/09/20 0805 04/09/20 1119  BP: 114/87 91/68  Pulse: (!) 123 (!) 106  Resp: (!) 21 (!) 29  Temp:    SpO2: 96%     Intake/Output Summary (Last 24 hours) at 04/09/2020 1409 Last data filed at 04/09/2020 0800 Gross per 24 hour  Intake 120 ml  Output -  Net 120 ml   Filed Weights   03/23/20 0342 03/25/20 0500 03/27/20 0500  Weight: 77.8 kg 77.7 kg 70.3 kg   Weight change:  Body mass index is 19.9 kg/m.   Physical Exam:  General exam: Seems to be restless.  Inconsistent on what he is asking to do. skin: No rashes, lesions or ulcers. HEENT: Trach tube anteriorly.  Copious secretions from his mouth Lungs: Clear to auscultation bilaterally CVS: Regular rate and rhythm, no murmur GI/Abd soft, nontender, nondistended, bowel sound present CNS: Eyes open.  Unable to follow commands.   Psychiatry: Unable to assess mood Extremities: No pedal edema, no calf tenderness  Data Review: I  have personally reviewed the laboratory data and studies available.  Recent Labs  Lab 04/03/20 1406 04/04/20 0205 04/05/20 0657 04/07/20 0503 04/09/20 1126  WBC 18.6* 20.2* 15.2* 13.1* 13.4*  NEUTROABS  --   --   --  11.0* 12.1*  HGB 12.5* 11.6* 11.7* 12.3* 12.2*  HCT 37.6* 34.5* 35.3* 36.6* 38.5*  MCV 92.8 92.5 92.2 91.0 94.8  PLT 391 336 358 315 246   Recent Labs  Lab 04/03/20 1406 04/04/20 0143 04/04/20 0205 04/04/20 0425 04/05/20 0657 04/05/20 0657 04/05/20 1107 04/06/20 0232 04/06/20 1656 04/07/20 0503 04/07/20 1640 04/08/20 0548 04/08/20 1707 04/09/20 1126  NA 138  --  137  --  138  --   --   --   --  137  --   --   --  135  K 5.4*  --  6.6*   < > 5.0  --  4.1 4.1  --  4.7  --   --   --  4.8  CL 100  --  101  --  97*  --   --   --   --  99  --   --   --  102  CO2 27  --  25  --  29  --   --   --   --  28  --   --   --  22  GLUCOSE 119*  --  119*  --  103*  --   --   --   --  107*  --   --   --  129*  BUN 13  --  14  --  19  --   --   --   --  16  --   --   --  24*   CREATININE 0.66  --  0.67  --  0.76  --   --   --   --  0.78  --   --   --  0.76  CALCIUM 9.3  --  9.1  --  9.2  --   --   --   --  9.3  --   --   --  8.7*  MG  --   --   --   --  2.3  --   --   --   --  2.4  --   --   --  2.2  PHOS 3.4   < >  --    < > 5.1*   < >  --  4.2   < > 4.1 4.0 4.4 3.8 3.1   < > = values in this interval not displayed.    Signed, Terrilee Croak, MD [facility] Pager: [phone] (Secure Chat preferred). 04/09/2020

## 2020-04-09 NOTE — Progress Notes (Addendum)
  Speech Language Pathology Treatment: Dysphagia  Patient Details Name: Kenneth Sullivan MRN: [id number] DOB: [birth year] Today's Date: 04/09/2020 Time: [id number] SLP Time Calculation (min) (ACUTE ONLY): 15 min  Assessment / Plan / Recommendation Clinical Impression  Pt was seen for swallowing/PMV.  Trach changed on 4/22 to a #6 cuffless.  Mouth full of frothy secretions upon arrival, and pt coughing secretions through inner cannula.  Provided oral suctioning and trach periphery was cleaned/dried. PMV placed.  Pt speaking in short phrases, requires intermittent verbal cues to take deep breath prior to talking.  Voice remains hoarse, low volume - required verbal cues to clear throat/secretions to be better understood.  VS stable throughout use. Provided with trials of chopped peaches - pt demonstrated effective mastication, no oral residue visualized post-swallow.  He ate half the container and then politely declined further POs.  Consumption of thin juice from a straw was managed without overt s/s of aspiration. Overall swallowing appears to be improving, inhibited by MS.  Recommend advancing diet to dysphagia 2, thin liquids with ongoing full assistance with eating. Hopefully this may improve his appetite.  Please place valve during meals to enhance communication effectiveness.  SLP will follow.   HPI HPI: 64 year old admitted 3/23 with shortness of breath with sats in the 40s on with witnessed bradycardic, asystolic arrest when EMS was present with 3 rounds of epi, 20 minutes to ROSC per chart. Found to also have bilateral PR's and had GI bleed following arrest. MRI showed multifocal acute-subactue ischemic infarcts c/w global hypoperfusion. Intubated 3/23 adn trach placed 4/2. CXR 4/3 no active disease. No evidence of pneumonia or pulmonary edema. Per chart pt smokes 1 pack/day, consumes 2-3 forty oz beers daily, + marijuana.      SLP Plan  Continue with current plan of care        Recommendations  Diet recommendations: Dysphagia 2 (fine chop);Thin liquid Liquids provided via: Straw;Cup Medication Administration: Crushed with puree Supervision: Staff to assist with self feeding;Full supervision/cueing for compensatory strategies Compensations: Minimize environmental distractions;Slow rate;Small sips/bites      Patient may use Passy-Muir Speech Valve: During all therapies with supervision;Intermittently with supervision PMSV Supervision: Full         Oral Care Recommendations: Oral care BID Follow up Recommendations: Skilled Nursing facility Plan: Continue with current plan of care       [place]. Tivis Ringer, [place] CCC/SLP Acute Rehabilitation Services Office number [phone] Pager [phone]   Assunta Curtis 04/09/2020, 12:54 PM

## 2020-04-10 DIAGNOSIS — J9601 Acute respiratory failure with hypoxia: Secondary | ICD-10-CM | POA: Diagnosis not present

## 2020-04-10 DIAGNOSIS — I469 Cardiac arrest, cause unspecified: Secondary | ICD-10-CM | POA: Diagnosis not present

## 2020-04-10 LAB — GLUCOSE, CAPILLARY
Glucose-Capillary: 86 mg/dL (ref 70–99)
Glucose-Capillary: 100 mg/dL — ABNORMAL HIGH (ref 70–99)
Glucose-Capillary: 109 mg/dL — ABNORMAL HIGH (ref 70–99)
Glucose-Capillary: 102 mg/dL — ABNORMAL HIGH (ref 70–99)
Glucose-Capillary: 109 mg/dL — ABNORMAL HIGH (ref 70–99)

## 2020-04-10 LAB — PHOSPHORUS: Phosphorus: 3.9 mg/dL (ref 2.5–4.6)

## 2020-04-10 MED ORDER — METOPROLOL TARTRATE 12.5 MG HALF TABLET
12.5000 mg | ORAL_TABLET | Freq: Two times a day (BID) | ORAL | Status: DC
  Administered 2020-04-10 – 2020-04-13 (×7): 12.5 mg via NASOGASTRIC
  Filled 2020-04-10 (×7): qty 1

## 2020-04-10 MED ORDER — ACETAMINOPHEN 160 MG/5ML PO SOLN
650.0000 mg | Freq: Four times a day (QID) | ORAL | Status: DC | PRN
  Administered 2020-04-11 – 2020-04-12 (×2): 650 mg
  Filled 2020-04-10 (×2): qty 20.3

## 2020-04-10 NOTE — Progress Notes (Signed)
PROGRESS NOTE  Kenneth Sullivan  DOB: 1956-03-12  PCP: [facility], [name] BTD:[id number]  DOA: 03/07/2020  LOS: 34 days   Chief Complaint  Patient presents with  . Cardiac Arrest   Brief narrative: Patient is a 64 year old with unknown medical history, not seen a physician for several years.   He presented to the ED on 3/23 after an acute onset shortness of breath with oxygen saturation on 40% by EMS. On site, he had witnessed bradycardic event leading to asystole/cardiac arrest.  ROSC achieved in 20 minutes with 3 rounds of epinephrine.  Brought to ED intubated.  Admitted to ICU. CTPA showed significantly massive PE/DVT and RV strain.  He underwent IR guided mechanical thrombectomy and IVC filter. Postprocedure, while on heparin drip, patient started having maroon-colored stool and bloody drainage from OGT.  GI did EGD which showed oozing gastric ulcer with clot. IR did GDA embolization Patient remained encephalopathic.  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  4/5, underwent tracheostomy.  PEG tube placement is planned but it is in Producer, television/film/video.  Unclear when it will be available.  Patient does not have LTAC benefits. Once PEG tube is placed, patient will be discharged to SNF.  Subjective: Patient was seen and examined this afternoon. Lying on bed.  Alert, awake, making inconsistent complaints.  Does not seem to be in physical distress.  Copious amount of secretions remain.    Assessment/Plan: Massive pulmonary embolism leading to sudden cardiac arrest leading to anoxic brain injury -s/p IR guided mechanical thrombectomy, IVC filter.  -Not on anticoagulation because of associated GI bleed and brain bleed.  Acute  vent dependent respiratory failure with hypoxia Secondary to anoxic brain injury -Remains on trach collar since 4/5 -pulmonology following to change trach tube site.  Acute GI bleeding  Acute blood loss anemia -No GI bleeding prior to admission. Post thrombectomy and heparinization, patient started to have melena and fresh blood in OGT.   -EGD showed acute GI bleed secondary to large gastric ulcer. -IR did GDA embolization. Continue PPI. Hemoglobin stable, monitor periodically  Acute encephalopathy Anoxic brain injury -Alert, awake, inconsistent in nature ability to have a conversation.  Spontaneous movement of all extremities seen. -Episodes of agitation requiring Haldol/Ativan as needed  Dysphagia  -Tends to have copious secretions out of trach collar and mouth.   -Speech evaluation appreciated. currently on dysphagia 1 with thin liquids however unlikely that he would meet his nutritional needs.   -PEG tube planned but in national shortage.  Currently has a core track in place.  Leukocytosis -No fever, has copious secretions no evidence of aspiration at this time.   -On prednisone in this admission.,  Unclear indication. -Continue to taper.  Currently at 20 mg daily.  -WBC count improving.  Mobility: Bedbound Code Status:  DNR  DVT prophylaxis:  SCDs Antimicrobials:  Currently not on antibiotics Fluid: Dextrose at 50 mils per hour to prevent hypoglycemia Diet: Dysphagia 1 diet.  Core track in place  Consultants: Palliative care following as well.  Specialties signed off. Family Communication:  None at bedside  Status is: Inpatient  Remains inpatient appropriate because:Inpatient level of care appropriate due to severity of illness   Dispo: The patient is from: Home              Anticipated d/c  is to: SNF              Anticipated d/c date is: Unclear at this time because unable to get a PEG tube in place because of national shortage              Patient currently is  not medically stable to d/c.   Antimicrobials: Anti-infectives (From admission, onward)   Start     Dose/Rate Route Frequency Ordered Stop   03/26/20 1100  doxycycline (VIBRAMYCIN) 100 mg in sodium chloride 0.9 % 250 mL IVPB  Status:  Discontinued     100 mg 125 mL/hr over 120 Minutes Intravenous 2 times daily 03/26/20 1002 04/01/20 1035   03/25/20 1100  doxycycline (VIBRA-TABS) tablet 100 mg  Status:  Discontinued     100 mg Per Tube Every 12 hours 03/25/20 1009 03/26/20 1002   03/25/20 1015  doxycycline (VIBRAMYCIN) 100 mg in sodium chloride 0.9 % 250 mL IVPB  Status:  Discontinued    Note to Pharmacy: To be given after the tracheal aspirate is collected.   100 mg 125 mL/hr over 120 Minutes Intravenous Every 12 hours 03/25/20 1005 03/25/20 1008        Code Status: DNR   Diet Order            DIET DYS 2 Room service appropriate? Yes with Assist; Fluid consistency: Thin  Diet effective now              Infusions:  . sodium chloride    . dextrose 50 mL/hr at 04/05/20 2348  . feeding supplement (JEVITY 1.2 CAL) 1,000 mL (04/10/20 0542)    Scheduled Meds: . chlorhexidine gluconate (MEDLINE KIT)  15 mL Mouth Rinse BID  . Chlorhexidine Gluconate Cloth  6 each Topical Q0600  . feeding supplement (PRO-STAT SUGAR FREE 64)  30 mL Per Tube Daily  . free water  200 mL Per Tube Q6H  . glycopyrrolate  1 mg Per Tube TID  . metoprolol tartrate  12.5 mg Per NG tube BID  . pantoprazole sodium  40 mg Per Tube BID  . predniSONE  20 mg Oral Q breakfast  . QUEtiapine  12.5 mg Per Tube QHS  . scopolamine  1 patch Transdermal Q72H  . sodium chloride flush  10-40 mL Intracatheter Q12H  . sodium chloride flush  10-40 mL Intracatheter Q12H    PRN meds: sodium chloride, acetaminophen, dextrose, docusate sodium, gelatin adsorbable, guaiFENesin-dextromethorphan, haloperidol lactate, hydrALAZINE, ipratropium-albuterol, LORazepam, sodium chloride flush   Objective: Vitals:   04/10/20 0925  04/10/20 1141  BP:    Pulse: (!) 109 98  Resp: 20 (!) 24  Temp:    SpO2:  100%    Intake/Output Summary (Last 24 hours) at 04/10/2020 1527 Last data filed at 04/10/2020 0905 Gross per 24 hour  Intake 3 ml  Output --  Net 3 ml   Filed Weights   03/23/20 0342 03/25/20 0500 03/27/20 0500  Weight: 77.8 kg 77.7 kg 70.3 kg   Weight change:  Body mass index is 19.9 kg/m.   Physical Exam:  General exam: Seems to be restless.  Inconsistent complaints. skin: No rashes, lesions or ulcers. HEENT: Trach tube anteriorly.  Copious secretions from his mouth Lungs: Clear to auscultation bilaterally CVS: Regular rate and rhythm, no murmur GI/Abd soft, nontender, nondistended, bowel sound present CNS: Eyes open.  Unable to follow commands.   Psychiatry: Unable to assess mood Extremities: No pedal edema, no calf tenderness  Data Review: I have  personally reviewed the laboratory data and studies available.  Recent Labs  Lab 04/04/20 0205 04/05/20 0657 04/07/20 0503 04/09/20 1126  WBC 20.2* 15.2* 13.1* 13.4*  NEUTROABS  --   --  11.0* 12.1*  HGB 11.6* 11.7* 12.3* 12.2*  HCT 34.5* 35.3* 36.6* 38.5*  MCV 92.5 92.2 91.0 94.8  PLT 336 358 315 246   Recent Labs  Lab 04/04/20 0205 04/04/20 0425 04/05/20 0657 04/05/20 0657 04/05/20 1107 04/06/20 0232 04/06/20 1656 04/07/20 0503 04/07/20 1640 04/08/20 0548 04/08/20 1707 04/09/20 1126 04/09/20 1612 04/10/20 0551  NA 137  --  138  --   --   --   --  137  --   --   --  135  --   --   K 6.6*   < > 5.0  --  4.1 4.1  --  4.7  --   --   --  4.8  --   --   CL 101  --  97*  --   --   --   --  99  --   --   --  102  --   --   CO2 25  --  29  --   --   --   --  28  --   --   --  22  --   --   GLUCOSE 119*  --  103*  --   --   --   --  107*  --   --   --  129*  --   --   BUN 14  --  19  --   --   --   --  16  --   --   --  24*  --   --   CREATININE 0.67  --  0.76  --   --   --   --  0.78  --   --   --  0.76  --   --   CALCIUM 9.1  --  9.2   --   --   --   --  9.3  --   --   --  8.7*  --   --   MG  --   --  2.3  --   --   --   --  2.4  --   --   --  2.2  --   --   PHOS  --    < > 5.1*   < >  --  4.2   < > 4.1   < > 4.4 3.8 3.1 3.1 3.9   < > = values in this interval not displayed.    Signed, Terrilee Croak, MD [facility] Pager: [phone] (Secure Chat preferred). 04/10/2020

## 2020-04-10 NOTE — Progress Notes (Addendum)
NAME:  Kenneth Sullivan, MRN:  [id number], DOB:  [birth year], LOS: 93 ADMISSION DATE:  03/07/2020, CONSULTATION DATE:  03/07/20 REFERRING MD:  Dr Roxanne Mins MD, CHIEF COMPLAINT:  Cardiac arrest  Brief History   64 year old with no significant past medical history.  Had acute shortness of breath with sats in the 40s on EMS arrival.  He had a witnessed bradycardic, asystolic arrest when EMS was present with 3 rounds of epi, 20 minutes to ROSC. Underwent normothermia protocol.  Work-up significant for massive PE, DVT with RV strain, GI bleed from gastric ulcer.  Past Medical History  Has not seen a doctor.  No past medical history. Smokes 1 pack/day, drinks 2 to 3 40 ounce beers every day.  Smokes marijuana.  Denies any other recreational drug use.  Significant Hospital Events   3/23 Admit, EGD with findings of oozing gastric ulcer with clot, IR for mechanical thrombectomy, IVC filter and GDA embolization 3/24 Started heparin drip  Consults:  Cardiology, GI, interventional radiology, neurolgoy  Procedures:  ETT 3/23 >>4/5 Trach 4/5 >>changed to 6 cuffless 4/22 >> L TLC 3/23 >> out ALine L Fem 3/23 >> 3/30  Significant Diagnostic Tests:  CTA 3/23 >> bilateral PE with RV/with ratio 1.9, emphysema CT head 3/23 >> chronic microvascular changes.  No acute findings Echo 3/23 >> RV is severely dilated with reduced function and D-shaped septum, LVEF 60-65%. LE Venous Duplex 3/24 >> acute DVT in BLE up to the femoral vein  MRI Brain 3/28 >> multifocal acute to subacute ischemic infarcts involving bilateral cerebral hemispheres, findings consistent with global hypoperfusion event related to cardiac arrest, associated scattered petechial hemorrhage about many areas of ischemia, evidence of hemorrhagic conversion in the right parietal lobe  Micro Data:  Bcx 3/23 >> negative [name] 3/23 >> negative BCx2 3/23 >> negative   Antimicrobials:    Interim history/subjective:  Remains restless/ agitated  despite ativan, pulling at lines, pulled out midline Per SLP notes and RN, patient still has oral and trach secretions. Increased to dysphagia 2 diet and thin liquids on 4/25 Remains on ATC 21%  Objective   Blood pressure 105/78, pulse (!) 116, temperature 97.8 F (36.6 C), temperature source Axillary, resp. rate (!) 26, height 6\' 2"  (1.88 m), weight 70.3 kg, SpO2 100 %.    FiO2 (%):  [21 %] 21 %   Intake/Output Summary (Last 24 hours) at 04/10/2020 [id number] Last data filed at 04/10/2020 [id number] Gross per 24 hour  Intake 3 ml  Output --  Net 3 ml   Filed Weights   03/23/20 0342 03/25/20 0500 03/27/20 0500  Weight: 77.8 kg 77.7 kg 70.3 kg    Examination:  General:  Chronically ill appearing adult male lying in bed, grimacing, agitated   HEENT: MM pink/moist, midline 6 cuffless shiley wnl, some dried secretions, some ongoing oral secretions, left nare cortrak  Neuro: awake, agitated, MAE, does not follow commands CV: rr, PULM:  Non labored, few scattered rhonchi, otherwise clear  GI: soft, bs +, pulled off condom cath with mittens  Extremities: warm/dry, no edema  Skin: no rashes   Resolved Hospital Problem list   Hypoglycemia Transaminitis  Assessment & Plan:   Tracheostomy dependent s/p Cardiac Arrest secondary to Massive PE s/p IVC filter Anoxic brain injury CVAs w/ hemorrhagic conversion Expressive aphasia  S/p massive UGI bleed requiring GDA embolization  Discussion Slowly improving with SLP, some low/ hoarse phonation with PMV, and able to tolerate/ progressed to dysphagia 2 diet and thin liquids.  Overall swallowing slowly improving, limited by mental status.   Plan Cont routine trach care Encourage PMV as able Cont to work w/ SLP Hold off down sizing today to a 4 given ongoing secretions.  Robinul has helped some.  Will re-evaluate and see again at the end of the week.  Call sooner if needed.       [name], MSN, AGACNP-BC [facility] 04/10/2020, 9:15 AM

## 2020-04-11 ENCOUNTER — Inpatient Hospital Stay (HOSPITAL_COMMUNITY): Payer: Medicaid Other

## 2020-04-11 DIAGNOSIS — I469 Cardiac arrest, cause unspecified: Secondary | ICD-10-CM | POA: Diagnosis not present

## 2020-04-11 DIAGNOSIS — J9601 Acute respiratory failure with hypoxia: Secondary | ICD-10-CM | POA: Diagnosis not present

## 2020-04-11 LAB — URINALYSIS, ROUTINE W REFLEX MICROSCOPIC
Bilirubin Urine: NEGATIVE
Glucose, UA: NEGATIVE mg/dL
Ketones, ur: NEGATIVE mg/dL
Nitrite: NEGATIVE
Protein, ur: 100 mg/dL — AB
Specific Gravity, Urine: 1.024 (ref 1.005–1.030)
pH: 7 (ref 5.0–8.0)

## 2020-04-11 LAB — URINALYSIS, MICROSCOPIC (REFLEX): WBC, UA: >50 WBC/hpf (ref 0–5)

## 2020-04-11 LAB — GLUCOSE, CAPILLARY
Glucose-Capillary: 97 mg/dL (ref 70–99)
Glucose-Capillary: 120 mg/dL — ABNORMAL HIGH (ref 70–99)
Glucose-Capillary: 156 mg/dL — ABNORMAL HIGH (ref 70–99)
Glucose-Capillary: 133 mg/dL — ABNORMAL HIGH (ref 70–99)
Glucose-Capillary: 84 mg/dL (ref 70–99)
Glucose-Capillary: 101 mg/dL — ABNORMAL HIGH (ref 70–99)

## 2020-04-11 LAB — PHOSPHORUS
Phosphorus: 4.6 mg/dL (ref 2.5–4.6)
Phosphorus: 3.7 mg/dL (ref 2.5–4.6)

## 2020-04-11 MED ORDER — JEVITY 1.2 CAL PO LIQD
900.0000 mL | ORAL | Status: DC
  Administered 2020-04-12 – 2020-04-16 (×3): 900 mL
  Filled 2020-04-11 (×7): qty 1000
  Filled 2020-04-11: qty 948
  Filled 2020-04-11: qty 1000

## 2020-04-11 MED ORDER — OXYCODONE HCL 5 MG PO TABS
5.0000 mg | ORAL_TABLET | Freq: Once | ORAL | Status: AC
  Administered 2020-04-11: 5 mg via ORAL
  Filled 2020-04-11: qty 1

## 2020-04-11 MED ORDER — OXYCODONE HCL 5 MG PO TABS
5.0000 mg | ORAL_TABLET | ORAL | Status: DC | PRN
  Administered 2020-04-11 – 2020-04-15 (×3): 5 mg via ORAL
  Filled 2020-04-11 (×3): qty 1

## 2020-04-11 MED ORDER — PREDNISONE 10 MG PO TABS
10.0000 mg | ORAL_TABLET | Freq: Every day | ORAL | Status: DC
  Administered 2020-04-12 – 2020-04-14 (×3): 10 mg via ORAL
  Filled 2020-04-11 (×3): qty 1

## 2020-04-11 NOTE — Evaluation (Signed)
Physical Therapy Evaluation Patient Details Name: Kenneth Sullivan MRN: [id number] DOB: 05-16-1956 Today's Date: 04/11/2020   History of Present Illness  Pt is 64 yo male with unknown medical hx.  He presented to ED on 3/23 with acute SHOB.  He had bradycardiac event that lead to asystole/cardiac arrest (ROSC achieved in 20 mins with 3 rounds epinephrine).  Pt was intubated and admitted to ICU.  Pt with massive PE/DVT and RV strain.  He underwent IR guided mechanical thrombectomy and IVC Filter.   Pt developed gastric ulcer and required GDA embolization.  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 Pt s/p trach on 4/5 and changed to cuffless on 4/22.  Currently, awaiting possible PEG.  Clinical Impression  Pt admitted with above diagnosis. Evaluation was significantly limited due to patient's agitation and inability to safely follow commands.  He was oriented to self only at this time.  He demonstrated 5/5 strength throughout extremities, but was unsafe to get OOB with assist of 1 due to not following commands and agitated with potential to be combative (hitting and kicking bed in frustration).  Pt fixated on borrowing money from therapist.  Based on clinical judgement pt does demonstrate the strength necessary to perform transfers but will likely have decreased safety and balance.  Pt currently with functional limitations due to the deficits listed below (see PT Problem List). Pt will benefit from skilled PT to increase their independence and safety with mobility to allow discharge to the venue listed below.       Follow Up Recommendations SNF    Equipment Recommendations  Other (comment)(to be assessed (pt unsafe to get OOB))    Recommendations for Other  Services       Precautions / Restrictions Precautions Precautions: Fall      Mobility  Bed Mobility               General bed mobility comments: Pt unsafe to attempt any bed mobility at this time.  He is impulsive, not following commands, unable to focus on task at hand, and hitting/kicking the bed in agitation because therapist won't let him borrow money.  Pt demonstrates strength necessary for transfers, but highly suspect will be impulsive and with decreased balance based on clinical judgement.  Transfers                    Ambulation/Gait                Stairs            Wheelchair Mobility    Modified Rankin (Stroke Patients Only)       Balance       Sitting balance - Comments: Unable to assess                                     Pertinent Vitals/Pain Pain Assessment: No/denies pain    Home Living Family/patient expects to be discharged to:: Skilled nursing facility                      Prior Function           Comments: Pt was unable to answer any PLOF or home environment questions.  However, likely  that pt was completeley independent prior to admission.     Hand Dominance        Extremity/Trunk Assessment   Upper Extremity Assessment Upper Extremity Assessment: Overall WFL for tasks assessed    Lower Extremity Assessment Lower Extremity Assessment: Overall WFL for tasks assessed(Pt moving all extremities throughout full ROM and demonstrating 5/5 strength.)    Cervical / Trunk Assessment Cervical / Trunk Assessment: Normal  Communication   Communication: No difficulties  Cognition Arousal/Alertness: Awake/alert Behavior During Therapy: Restless;Agitated;Impulsive Overall Cognitive Status: No family/caregiver present to determine baseline cognitive functioning Area of Impairment: Orientation;Attention;Memory;Problem solving;Following commands;Safety/judgement;Awareness                  Orientation Level: Disoriented to;Place;Time;Situation   Memory: Decreased recall of precautions;Decreased short-term memory Following Commands: Follows one step commands inconsistently Safety/Judgement: Decreased awareness of safety;Decreased awareness of deficits   Problem Solving: Difficulty sequencing;Requires tactile cues;Requires verbal cues General Comments: Pt fixated on "borrowing $20."  He asked/begged multiple times during therapy "Hut, just let me borrow the money."  Unable to focus on anything else and becoming agitated at times (hitting bed with fist and kicking with feet).  Stated he thought therapist was his sister.      General Comments      Exercises     Assessment/Plan    PT Assessment Patient needs continued PT services  PT Problem List Decreased mobility;Decreased safety awareness;Decreased knowledge of precautions;Decreased activity tolerance;Cardiopulmonary status limiting activity;Decreased balance;Decreased knowledge of use of DME;Decreased cognition;Decreased coordination;Decreased strength       PT Treatment Interventions DME instruction;Therapeutic activities;Cognitive remediation;Gait training;Therapeutic exercise;Patient/family education;Balance training;Functional mobility training    PT Goals (Current goals can be found in the Care Plan section)  Acute Rehab PT Goals Patient Stated Goal: "borrow money" PT Goal Formulation: Patient unable to participate in goal setting Time For Goal Achievement: 04/25/20 Potential to Achieve Goals: Fair    Frequency Min 2X/week   Barriers to discharge        Co-evaluation               AM-PAC PT "6 Clicks" Mobility  Outcome Measure Help needed turning from your back to your side while in a flat bed without using bedrails?: Total Help needed moving from lying on your back to sitting on the side of a flat bed without using bedrails?: Total Help needed moving to and from a bed to a chair (including a  wheelchair)?: Total Help needed standing up from a chair using your arms (e.g., wheelchair or bedside chair)?: Total Help needed to walk in hospital room?: Total Help needed climbing 3-5 steps with a railing? : Total 6 Click Score: 6    End of Session   Activity Tolerance: Treatment limited secondary to agitation Patient left: in bed;with call bell/phone within reach;with restraints reapplied;with bed alarm set(mittens and waist restraint were left in place - not able to be removed during therapy) Nurse Communication: Mobility status PT Visit Diagnosis: Unsteadiness on feet (R26.81);Other abnormalities of gait and mobility (R26.89)    Time: 1420-1445 PT Time Calculation (min) (ACUTE ONLY): 25 min   Charges:   PT Evaluation $PT Eval Low Complexity: 1 Low          Maggie Font, PT Acute Rehab Services Pager [phone] [facility] [phone] [facility] [phone]   [name] 04/11/2020, 3:13 PM

## 2020-04-11 NOTE — Progress Notes (Signed)
Patient sitting at 90 degrees, able to eat all of magic cup, pears, and chocolate ensure.  Tube feeds remain off for patient safety, will continue to offer nutrition and safe feeding.

## 2020-04-11 NOTE — Progress Notes (Signed)
Pt's Cortrak tube noted to be noticeably pulled out further from pt's nares.  TF stopped and MD called to obtain KUB for confirmation of Cortrak placement.  For patient's safety, will hold TF until confirmation can be acknowledged.  Unable to find documentation of cm marking at time of insertion.

## 2020-04-11 NOTE — Progress Notes (Addendum)
Notified provider of milky colored urine.  Changed out external catheter and foley bag, orders received for urine sample

## 2020-04-11 NOTE — Progress Notes (Signed)
   NAME:  Kenneth Sullivan, MRN:  [id number], DOB:  [birth year], LOS: 67 ADMISSION DATE:  03/07/2020, CONSULTATION DATE:  03/07/20 REFERRING MD:  Dr Roxanne Mins MD, CHIEF COMPLAINT:  Cardiac arrest  Brief History   64 year old with no significant past medical history.  Had acute shortness of breath with sats in the 40s on EMS arrival.  He had a witnessed bradycardic, asystolic arrest when EMS was present with 3 rounds of epi, 20 minutes to ROSC. Underwent normothermia protocol.  Work-up significant for massive PE, DVT with RV strain, GI bleed from gastric ulcer.  Past Medical History  Has not seen a doctor.  No past medical history. Smokes 1 pack/day, drinks 2 to 3 40 ounce beers every day.  Smokes marijuana.  Denies any other recreational drug use.  Significant Hospital Events   3/23 Admit, EGD with findings of oozing gastric ulcer with clot, IR for mechanical thrombectomy, IVC filter and GDA embolization 3/24 Started heparin drip  Consults:  Cardiology, GI, interventional radiology, neurolgoy  Procedures:  ETT 3/23 >>4/5 Trach 4/5 >>changed to 6 cuffless 4/22 >> L TLC 3/23 >> out ALine L Fem 3/23 >> 3/30  Significant Diagnostic Tests:  CTA 3/23 >> bilateral PE with RV/with ratio 1.9, emphysema CT head 3/23 >> chronic microvascular changes.  No acute findings Echo 3/23 >> RV is severely dilated with reduced function and D-shaped septum, LVEF 60-65%. LE Venous Duplex 3/24 >> acute DVT in BLE up to the femoral vein  MRI Brain 3/28 >> multifocal acute to subacute ischemic infarcts involving bilateral cerebral hemispheres, findings consistent with global hypoperfusion event related to cardiac arrest, associated scattered petechial hemorrhage about many areas of ischemia, evidence of hemorrhagic conversion in the right parietal lobe  Micro Data:  Bcx 3/23 >> negative [name] 3/23 >> negative BCx2 3/23 >> negative   Antimicrobials:    Interim history/subjective:  No major events  overnight. Some changes in urine color so urinalysis sent  Objective   Blood pressure 111/83, pulse (!) 109, temperature 98.3 F (36.8 C), temperature source Oral, resp. rate (!) 25, height 6\' 2"  (1.88 m), weight 70.3 kg, SpO2 98 %.    FiO2 (%):  [21 %] 21 %   Intake/Output Summary (Last 24 hours) at 04/11/2020 0926 Last data filed at 04/11/2020 0300 Gross per 24 hour  Intake 120 ml  Output 760 ml  Net -640 ml   Filed Weights   03/23/20 0342 03/25/20 0500 03/27/20 0500  Weight: 77.8 kg 77.7 kg 70.3 kg    Examination:  Chronically ill man lying in bed looking out window Small secretions from trach Follows commands briskly Communicating past trach without PMV  Resolved Hospital Problem list   Hypoglycemia Transaminitis  Assessment & Plan:   Tracheostomy dependent s/p Cardiac Arrest secondary to Massive PE s/p IVC filter Anoxic brain injury CVAs w/ hemorrhagic conversion Expressive aphasia  S/p massive UGI bleed requiring GDA embolization  Discussion Slowly improving with SLP, some low/ hoarse phonation with PMV, and able to tolerate/ progressed to dysphagia 2 diet and thin liquids.  Overall swallowing slowly improving, limited by mental status.   Plan Downsize to 4-0 and cap Potential decannulation tomorrow Trial of oxycodone for uncontrolled pain  Erskine Emery MD PCCM      [name], MSN, AGACNP-BC [facility] 04/11/2020, 9:26 AM

## 2020-04-11 NOTE — Progress Notes (Signed)
PROGRESS NOTE  Kenneth Sullivan  DOB: 1956-08-31  PCP: [facility], [name] HQI:[id number]  DOA: 03/07/2020  LOS: 35 days   Chief Complaint  Patient presents with  . Cardiac Arrest   Brief narrative: Patient is a 64 year old with unknown medical history, not seen a physician for several years.   He presented to the ED on 3/23 after an acute onset shortness of breath with oxygen saturation on 40% by EMS. On site, he had witnessed bradycardic event leading to asystole/cardiac arrest.  ROSC achieved in 20 minutes with 3 rounds of epinephrine.  Brought to ED intubated.  Admitted to ICU. CTPA showed significantly massive PE/DVT and RV strain.  He underwent IR guided mechanical thrombectomy and IVC filter. Postprocedure, while on heparin drip, patient started having maroon-colored stool and bloody drainage from OGT.  GI did EGD which showed oozing gastric ulcer with clot. IR did GDA embolization Patient remained encephalopathic.  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  4/5, underwent tracheostomy.    Patient is waiting for a PEG tube placement but PEG tube is in national shortage so he has not been able to get it done.  He is currently on core track feeding per nutritionist.  Also on dysphagia 2 diet but appetite is low. While awaiting for the procedure, I believe his swallowing has improved.  I discussed with dietitian and speech therapist today to plan to switch him from continuous to nightly tube feeding which may improve his appetite during the day.  If successful, we may not need to put a PEG tube in as he can be discharged to SNF earlier than planned.  Subjective: Patient was seen and examined this morning.  He is alert, awake,  upset and restless He is able to answer simple questions. 'I am in the hospital. I'm tired of being here. I need the pill.'  I asked him to specify what pill he wanted. He became frustrated and said, 'Go away, u won't understand my problem.'  He sounds a strong on his voice today.  He is also partially making sense and able to have some conversation.  He is constantly moving his legs.  He has mittens in his hands.  Assessment/Plan: Massive pulmonary embolism leading to sudden cardiac arrest leading to anoxic brain injury -s/p IR guided mechanical thrombectomy, IVC filter.  -Not on anticoagulation because of associated GI bleed and brain bleed.  Acute vent dependent respiratory failure with hypoxia Secondary to anoxic brain injury -Remains on trach collar since 4/5 -pulmonology following to change trach tube site.  Acute GI bleeding  Acute blood loss anemia -No GI bleeding prior to admission. Post thrombectomy and heparinization, patient started to have melena and fresh blood in OGT.   -EGD showed acute GI bleed secondary to large gastric ulcer. -IR did GDA embolization. Continue PPI. Hemoglobin stable, monitor periodically  Acute encephalopathy Anoxic brain injury -Gradually improving mental status.  It partially making sense now.  Upset and restless at times.  Able to follow motor commands but is inconsistent with that.   -Episodes of agitation requiring Haldol/Ativan as needed.  Dysphagia  -Tends to have copious secretions out of trach collar and mouth.   -Patient is waiting for a PEG tube placement but PEG tube is in national shortage so he has not been able to  get it done.  He is currently on core track feeding per nutritionist.  Also on dysphagia 2 diet but appetite is low. While awaiting for the procedure, I believe his swallowing has improved.  I discussed with dietitian and speech therapist today to plan to switch him from continuous to nightly tube feeding which may improve his  appetite during the day.  If successful, we may not need to put a PEG tube in as he can be discharged to SNF earlier than planned.  Leukocytosis -No fever, has copious secretions no evidence of aspiration at this time.   -On prednisone in this admission.,  Unclear indication. -Continue to taper.  Currently at 20 mg daily.  Reduce to 10 mg daily from tomorrow. -WBC count improving.  Mobility: Bedbound Code Status:  DNR  DVT prophylaxis:  SCDs Antimicrobials:  Currently not on antibiotics Fluid: Stop IV fluid today Diet: Dysphagia 2 diet with thin liquid.  Core track in place with continuous feeding  Consultants: Palliative care following as well.  Specialties signed off. Family Communication:  None at bedside  Status is: Inpatient  Remains inpatient appropriate because:Inpatient level of care appropriate due to severity of illness   Dispo: The patient is from: Home              Anticipated d/c is to: SNF              Anticipated d/c date is: If oral appetite is promising, patient can be discharged in next few days without her PEG tube.              Patient currently is not medically stable to d/c.   Antimicrobials: Anti-infectives (From admission, onward)   Start     Dose/Rate Route Frequency Ordered Stop   03/26/20 1100  doxycycline (VIBRAMYCIN) 100 mg in sodium chloride 0.9 % 250 mL IVPB  Status:  Discontinued     100 mg 125 mL/hr over 120 Minutes Intravenous 2 times daily 03/26/20 1002 04/01/20 1035   03/25/20 1100  doxycycline (VIBRA-TABS) tablet 100 mg  Status:  Discontinued     100 mg Per Tube Every 12 hours 03/25/20 1009 03/26/20 1002   03/25/20 1015  doxycycline (VIBRAMYCIN) 100 mg in sodium chloride 0.9 % 250 mL IVPB  Status:  Discontinued    Note to Pharmacy: To be given after the tracheal aspirate is collected.   100 mg 125 mL/hr over 120 Minutes Intravenous Every 12 hours 03/25/20 1005 03/25/20 1008        Code Status: DNR   Diet Order            DIET DYS  2 Room service appropriate? Yes with Assist; Fluid consistency: Thin  Diet effective now              Infusions:  . sodium chloride    . dextrose 50 mL/hr at 04/05/20 2348    Scheduled Meds: . chlorhexidine gluconate (MEDLINE KIT)  15 mL Mouth Rinse BID  . Chlorhexidine Gluconate Cloth  6 each Topical Q0600  . feeding supplement (JEVITY 1.2 CAL)  900 mL Per Tube Q24H  . feeding supplement (PRO-STAT SUGAR FREE 64)  30 mL Per Tube Daily  . free water  200 mL Per Tube Q6H  . glycopyrrolate  1 mg Per Tube TID  . metoprolol tartrate  12.5 mg Per NG tube BID  . pantoprazole sodium  40 mg Per Tube BID  . [START ON 04/12/2020] predniSONE  10 mg Oral Q breakfast  .  QUEtiapine  12.5 mg Per Tube QHS  . scopolamine  1 patch Transdermal Q72H  . sodium chloride flush  10-40 mL Intracatheter Q12H  . sodium chloride flush  10-40 mL Intracatheter Q12H    PRN meds: sodium chloride, acetaminophen (TYLENOL) oral liquid 160 mg/5 mL, dextrose, docusate sodium, gelatin adsorbable, guaiFENesin-dextromethorphan, haloperidol lactate, hydrALAZINE, ipratropium-albuterol, LORazepam, oxyCODONE, sodium chloride flush   Objective: Vitals:   04/11/20 1124 04/11/20 1200  BP:    Pulse: (!) 108 (!) 102  Resp: (!) 23 17  Temp:    SpO2: 96% 96%    Intake/Output Summary (Last 24 hours) at 04/11/2020 1324 Last data filed at 04/11/2020 0300 Gross per 24 hour  Intake 120 ml  Output 760 ml  Net -640 ml   Filed Weights   03/23/20 0342 03/25/20 0500 03/27/20 0500  Weight: 77.8 kg 77.7 kg 70.3 kg   Weight change:  Body mass index is 19.9 kg/m.   Physical Exam:  General exam: Seems to be restless.  Inconsistent complaints. skin: No rashes, lesions or ulcers. HEENT: Trach tube anteriorly.  Copious secretions from his mouth Lungs: Clear to auscultation bilaterally CVS: Regular rate and rhythm, no murmur GI/Abd soft, nontender, nondistended, bowel sound present CNS: Able to have simple conversation today.   Knows that he is in the hospital.  Able to follow motor commands. Psychiatry: Unable to assess mood Extremities: No pedal edema, no calf tenderness  Data Review: I have personally reviewed the laboratory data and studies available.  Recent Labs  Lab 04/05/20 0657 04/07/20 0503 04/09/20 1126  WBC 15.2* 13.1* 13.4*  NEUTROABS  --  11.0* 12.1*  HGB 11.7* 12.3* 12.2*  HCT 35.3* 36.6* 38.5*  MCV 92.2 91.0 94.8  PLT 358 315 246   Recent Labs  Lab 04/05/20 0657 04/05/20 0657 04/05/20 1107 04/06/20 0232 04/06/20 1656 04/07/20 0503 04/07/20 1640 04/08/20 1707 04/09/20 1126 04/09/20 1612 04/10/20 0551 04/11/20 0549  NA 138  --   --   --   --  137  --   --  135  --   --   --   K 5.0  --  4.1 4.1  --  4.7  --   --  4.8  --   --   --   CL 97*  --   --   --   --  99  --   --  102  --   --   --   CO2 29  --   --   --   --  28  --   --  22  --   --   --   GLUCOSE 103*  --   --   --   --  107*  --   --  129*  --   --   --   BUN 19  --   --   --   --  16  --   --  24*  --   --   --   CREATININE 0.76  --   --   --   --  0.78  --   --  0.76  --   --   --   CALCIUM 9.2  --   --   --   --  9.3  --   --  8.7*  --   --   --   MG 2.3  --   --   --   --  2.4  --   --  2.2  --   --   --   PHOS 5.1*   < >  --  4.2   < > 4.1   < > 3.8 3.1 3.1 3.9 4.6   < > = values in this interval not displayed.    Signed, Terrilee Croak, MD [facility] Pager: [phone] (Secure Chat preferred). 04/11/2020

## 2020-04-12 DIAGNOSIS — D649 Anemia, unspecified: Secondary | ICD-10-CM

## 2020-04-12 DIAGNOSIS — R5381 Other malaise: Secondary | ICD-10-CM

## 2020-04-12 DIAGNOSIS — I469 Cardiac arrest, cause unspecified: Secondary | ICD-10-CM | POA: Diagnosis not present

## 2020-04-12 DIAGNOSIS — K254 Chronic or unspecified gastric ulcer with hemorrhage: Secondary | ICD-10-CM

## 2020-04-12 DIAGNOSIS — D72825 Bandemia: Secondary | ICD-10-CM

## 2020-04-12 DIAGNOSIS — J9601 Acute respiratory failure with hypoxia: Secondary | ICD-10-CM | POA: Diagnosis not present

## 2020-04-12 DIAGNOSIS — R1312 Dysphagia, oropharyngeal phase: Secondary | ICD-10-CM

## 2020-04-12 LAB — PHOSPHORUS
Phosphorus: 4.4 mg/dL (ref 2.5–4.6)
Phosphorus: 3.8 mg/dL (ref 2.5–4.6)

## 2020-04-12 LAB — BASIC METABOLIC PANEL
Anion gap: 11 (ref 5–15)
BUN: 28 mg/dL — ABNORMAL HIGH (ref 8–23)
CO2: 26 mmol/L (ref 22–32)
Calcium: 9.2 mg/dL (ref 8.9–10.3)
Chloride: 102 mmol/L (ref 98–111)
Creatinine, Ser: 0.85 mg/dL (ref 0.61–1.24)
GFR calc Af Amer: >60 mL/min (ref >60)
GFR calc non Af Amer: >60 mL/min (ref >60)
Glucose, Bld: 96 mg/dL (ref 70–99)
Potassium: 4.4 mmol/L (ref 3.5–5.1)
Sodium: 139 mmol/L (ref 135–145)

## 2020-04-12 LAB — CBC WITH DIFFERENTIAL/PLATELET
Abs Immature Granulocytes: 0.05 10*3/uL (ref 0.00–0.07)
Basophils Absolute: 0 10*3/uL (ref 0.0–0.1)
Basophils Relative: 0 %
Eosinophils Absolute: 0.1 10*3/uL (ref 0.0–0.5)
Eosinophils Relative: 1 %
HCT: 36.8 % — ABNORMAL LOW (ref 39.0–52.0)
Hemoglobin: 12 g/dL — ABNORMAL LOW (ref 13.0–17.0)
Immature Granulocytes: 1 %
Lymphocytes Relative: 12 %
Lymphs Abs: 1.3 10*3/uL (ref 0.7–4.0)
MCH: 30.5 pg (ref 26.0–34.0)
MCHC: 32.6 g/dL (ref 30.0–36.0)
MCV: 93.4 fL (ref 80.0–100.0)
Monocytes Absolute: 0.9 10*3/uL (ref 0.1–1.0)
Monocytes Relative: 9 %
Neutro Abs: 8.5 10*3/uL — ABNORMAL HIGH (ref 1.7–7.7)
Neutrophils Relative %: 77 %
Platelets: 246 10*3/uL (ref 150–400)
RBC: 3.94 MIL/uL — ABNORMAL LOW (ref 4.22–5.81)
RDW: 18 % — ABNORMAL HIGH (ref 11.5–15.5)
WBC: 10.9 10*3/uL — ABNORMAL HIGH (ref 4.0–10.5)
nRBC: 0 % (ref 0.0–0.2)

## 2020-04-12 LAB — GLUCOSE, CAPILLARY
Glucose-Capillary: 101 mg/dL — ABNORMAL HIGH (ref 70–99)
Glucose-Capillary: 133 mg/dL — ABNORMAL HIGH (ref 70–99)
Glucose-Capillary: 149 mg/dL — ABNORMAL HIGH (ref 70–99)
Glucose-Capillary: 88 mg/dL (ref 70–99)
Glucose-Capillary: 88 mg/dL (ref 70–99)
Glucose-Capillary: 94 mg/dL (ref 70–99)

## 2020-04-12 LAB — MAGNESIUM: Magnesium: 2.2 mg/dL (ref 1.7–2.4)

## 2020-04-12 MED ORDER — ENSURE ENLIVE PO LIQD
237.0000 mL | Freq: Three times a day (TID) | ORAL | Status: DC
  Administered 2020-04-12 – 2020-04-17 (×11): 237 mL via ORAL
  Filled 2020-04-12: qty 237

## 2020-04-12 MED ORDER — SODIUM CHLORIDE 0.9 % IV BOLUS
500.0000 mL | Freq: Once | INTRAVENOUS | Status: AC
  Administered 2020-04-12: 500 mL via INTRAVENOUS

## 2020-04-12 NOTE — Progress Notes (Signed)
Error

## 2020-04-12 NOTE — Progress Notes (Signed)
Nutrition Follow-up  RD working remotely.  INTERVENTION:   Continue nocturnal tube feeds via Cortrak: - Jevity 1.2 @ 75 ml/hr for 12 hours from 1800 to 0600 (total of 900 ml) - Pro-stat 30 ml daily - Free water 200 ml q 6 hours  Tube feeding regimen and free water provides 1180 kcal, 65 grams of protein, and 1526 ml of H2O (56% kcal needs, 59% protein needs).  - Ensure Enlive po TID, each supplement provides 350 kcal and 20 grams of protein  NUTRITION DIAGNOSIS:   Inadequate oral intake related to lethargy/confusion as evidenced by meal completion < 25%.  Ongoing  GOAL:   Patient will meet greater than or equal to 90% of their needs  Progressing  MONITOR:   PO intake, Supplement acceptance, Diet advancement, Labs, Weight trends, TF tolerance  REASON FOR ASSESSMENT:   Ventilator    ASSESSMENT:   64 yo male admitted S/P cardiac arrest, S/P normothermia protocol. Found to have massive PE, DVT, GI bleed from gastric ulcer. PMH includes current smoker, heavy alcohol use.  4/02 - trach  4/09 - Cortrak placed  4/11 - pt pulled out trach, replaced by RT, TF held due to Cortrak leaking  4/13 - MBS, Dysphagia 1 with thin liquids started 4/14 - Cortrak removed 4/16 - Cortrak replaced 4/22 - Cortrak clogged, replaced by diagnostic radiology (tip in pre-pyloric region of stomach) 4/25 - diet advanced to Dysphagia 2 4/27 - tube feeds changed to nocturnal  Tube feedings changed to nocturnal yesterday to stimulate appetite during the day.  Pt continues to await PEG tube placement. Per IR, pt is waiting for shipment of gastrostomy tubes to arrive. There is currently a Producer, television/film/video of G-tubes.  Pt with improving ability to use PMV. Noted possible decannulation today.  Per RN note yesterday, Cortrak tube noted to be pulled out further yesterday afternoon. KUB was obtained and Cortrak remains in stomach. Spoke with RN this morning who reports tube feeds were restarted last  night.  Per RN note last night, pt able to eat all of Magic Cup, pears, and chocolate Ensure Enlive. RD will order Ensure Enlive TID.  No new weights available since 03/27/20.  Meal Completion: 25% x last 4 recorded meals  Medications reviewed and include: protonix,   Labs reviewed. CBG's: 84-156 x 24 hours  Diet Order:   Diet Order            DIET DYS 2 Room service appropriate? Yes with Assist; Fluid consistency: Thin  Diet effective now              EDUCATION NEEDS:   Not appropriate for education at this time  Skin:  Skin Assessment: Skin Integrity Issues: Other: puncture neck, groin  Last BM:  04/11/20 medium type 6  Height:   Ht Readings from Last 1 Encounters:  03/07/20 6\' 2"  (1.88 m)    Weight:   Wt Readings from Last 1 Encounters:  03/27/20 70.3 kg    Ideal Body Weight:  86.4 kg  BMI:  Body mass index is 19.9 kg/m.  Estimated Nutritional Needs:   Kcal:  2100-2300  Protein:  110-125 gm  Fluid:  >/= 2 L    Gaynell Face, MS, RD, LDN Inpatient Clinical Dietitian Pager: [phone] Weekend/After Hours: [phone]

## 2020-04-12 NOTE — Progress Notes (Signed)
  Speech Language Pathology Treatment: Dysphagia  Patient Details Name: Annette Yockel MRN: [id number] DOB: 11-20-1956 Today's Date: 04/12/2020 Time: [id number] SLP Time Calculation (min) (ACUTE ONLY): 13 min  Assessment / Plan / Recommendation Clinical Impression  Pt continues to fluctuate between significant frustration and self regulation "yes ma'am". He was decannulated this morning and no significant air leak was audible with vocal intensity fair-good. Expressive language contains many paraphasias and at times is unable to express his intended thoughts increasing his frustration.  Consumed limited trial of solid texture and thin liquids (suspect due to frustration) without indications of airway compromise. Hand over hand feeding with significant left neglect. Pt requires full assist with meals due to his impulsivity, visual deficits and decreased cognition. Continue to intervene for safety and ability to upgrade.     HPI HPI: 64 year old admitted 3/23 with shortness of breath with sats in the 40s on with witnessed bradycardic, asystolic arrest when EMS was present with 3 rounds of epi, 20 minutes to ROSC per chart. Found to also have bilateral PR's and had GI bleed following arrest. MRI showed multifocal acute-subactue ischemic infarcts c/w global hypoperfusion. Intubated 3/23 adn trach placed 4/2. CXR 4/3 no active disease. No evidence of pneumonia or pulmonary edema. Per chart pt smokes 1 pack/day, consumes 2-3 forty oz beers daily, + marijuana.      SLP Plan  Continue with current plan of care       Recommendations  Diet recommendations: Dysphagia 2 (fine chop);Thin liquid Liquids provided via: Straw;Cup Medication Administration: Crushed with puree Supervision: Staff to assist with self feeding;Full supervision/cueing for compensatory strategies Compensations: Minimize environmental distractions;Slow rate;Small sips/bites Postural Changes and/or Swallow Maneuvers: Seated upright 90  degrees                Oral Care Recommendations: Oral care BID Follow up Recommendations: Skilled Nursing facility SLP Visit Diagnosis: Dysphagia, unspecified (R13.10) Plan: Continue with current plan of care       GO                Houston Siren 04/12/2020, 4:28 PM  [name] Colvin Caroli.Ed Risk analyst [phone] Office [phone]

## 2020-04-12 NOTE — Progress Notes (Signed)
Notified on call provider of hypotension trends

## 2020-04-12 NOTE — Progress Notes (Signed)
   NAME:  Kenneth Sullivan, MRN:  [id number], DOB:  [birth year], LOS: 53 ADMISSION DATE:  03/07/2020, CONSULTATION DATE:  03/07/20 REFERRING MD:  Dr Roxanne Mins MD, CHIEF COMPLAINT:  Cardiac arrest  Brief History   64 year old with no significant past medical history.  Had acute shortness of breath with sats in the 40s on EMS arrival.  He had a witnessed bradycardic, asystolic arrest when EMS was present with 3 rounds of epi, 20 minutes to ROSC. Underwent normothermia protocol.  Work-up significant for massive PE, DVT with RV strain, GI bleed from gastric ulcer.  Past Medical History  Has not seen a doctor.  No past medical history. Smokes 1 pack/day, drinks 2 to 3 40 ounce beers every day.  Smokes marijuana.  Denies any other recreational drug use.  Significant Hospital Events   3/23 Admit, EGD with findings of oozing gastric ulcer with clot, IR for mechanical thrombectomy, IVC filter and GDA embolization 3/24 Started heparin drip  Consults:  Cardiology, GI, interventional radiology, neurolgoy  Procedures:  ETT 3/23 >>4/5 Trach 4/5 >>changed to 6 cuffless 4/22 >> L TLC 3/23 >> out ALine L Fem 3/23 >> 3/30  Significant Diagnostic Tests:  CTA 3/23 >> bilateral PE with RV/with ratio 1.9, emphysema CT head 3/23 >> chronic microvascular changes.  No acute findings Echo 3/23 >> RV is severely dilated with reduced function and D-shaped septum, LVEF 60-65%. LE Venous Duplex 3/24 >> acute DVT in BLE up to the femoral vein  MRI Brain 3/28 >> multifocal acute to subacute ischemic infarcts involving bilateral cerebral hemispheres, findings consistent with global hypoperfusion event related to cardiac arrest, associated scattered petechial hemorrhage about many areas of ischemia, evidence of hemorrhagic conversion in the right parietal lobe  Micro Data:  Bcx 3/23 >> negative [name] 3/23 >> negative BCx2 3/23 >> negative   Antimicrobials:    Interim history/subjective:  No events. Tolerated  capping. A bit confused this morning.  Objective   Blood pressure 113/64, pulse (!) 107, temperature 97.8 F (36.6 C), temperature source Axillary, resp. rate (!) 25, height 6\' 2"  (1.88 m), weight 70.3 kg, SpO2 96 %.        Intake/Output Summary (Last 24 hours) at 04/12/2020 0947 Last data filed at 04/12/2020 0500 Gross per 24 hour  Intake 2160 ml  Output --  Net 2160 ml   Filed Weights   03/23/20 0342 03/25/20 0500 03/27/20 0500  Weight: 77.8 kg 77.7 kg 70.3 kg    Examination:  Chronically ill man lying in bed looking out window Capped trach in place Follows commands briskly Communicating past trach without [facility] Problem list   Hypoglycemia Transaminitis  Assessment & Plan:   Tracheostomy dependent s/p Cardiac Arrest secondary to Massive PE s/p IVC filter Anoxic brain injury CVAs w/ hemorrhagic conversion Expressive aphasia  S/p massive UGI bleed requiring GDA embolization  Discussion - Improved, no indication for continuing trach. - Tracheostomy removed, occlusive dressing applied, apply pressure to stoma if able during phonation and cough to encourage closure - If no closure of stoma within 4 weeks would send to ENT for closure - PCCM will sign off, call if further questions or concerns  Erskine Emery MD PCCM

## 2020-04-12 NOTE — Progress Notes (Signed)
PT has been decannulated  sats are stable  RT to monitor

## 2020-04-12 NOTE — Progress Notes (Signed)
PROGRESS NOTE  Kenneth Sullivan WEX:[id number] DOB: 06-Oct-1956   PCP: Medicine, [name]  Patient is from: home  DOA: 03/07/2020 LOS: 51  Brief Narrative / Interim history: 64 year old with unknown medical history, not seen a physician for several years.  He presented to the ED on 3/23 after an acute onset shortness of breath with oxygen saturation on 40% by EMS. On site, he had witnessed bradycardic event leading to asystole/cardiac arrest.  ROSC achieved in 20 minutes with 3 rounds of epinephrine.  Brought to ED intubated.  Admitted to ICU. CTPA showed significantly massive PE/DVT and RV strain.  He underwent IR guided mechanical thrombectomy and IVC filter. Postprocedure, while on heparin drip, patient started having maroon-colored stool and bloody drainage from OGT.  GI did EGD which showed oozing gastric ulcer with clot. IR did GDA embolization  Patient remained encephalopathic. 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   4/5, underwent tracheostomy.  Patient is waiting for a PEG tube placement but PEG tube is in national shortage so he has not been able to get it done.  He is currently on core track feeding per nutritionist.  Also on dysphagia 2 diet but appetite is low. While awaiting for the procedure, I believe his swallowing has improved.  I discussed with dietitian and speech therapist today to plan to switch him from continuous to nightly tube feeding which may improve his appetite during the day. If successful, we may not need to put a PEG tube in as he can be discharged to SNF earlier than planned.  Subjective: Seen and examined earlier this morning.  No major events overnight of this morning.  He is continuously talking.   Seems to be hallucinating.  Does not appear to be in distress.  No agitation.  He is oriented to self and follows some commands.  He thinks he is at [facility].  Objective: Vitals:   04/12/20 0320 04/12/20 0340 04/12/20 0659 04/12/20 0755  BP: 102/77 104/72  113/64  Pulse: 95 100 100 (!) 107  Resp: 14 20 20  (!) 25  Temp: 97.8 F (36.6 C)   97.8 F (36.6 C)  TempSrc: Oral   Axillary  SpO2: 98% 98% 96% 96%  Weight:      Height:        Intake/Output Summary (Last 24 hours) at 04/12/2020 1054 Last data filed at 04/12/2020 0500 Gross per 24 hour  Intake 2160 ml  Output --  Net 2160 ml   Filed Weights   03/23/20 0342 03/25/20 0500 03/27/20 0500  Weight: 77.8 kg 77.7 kg 70.3 kg    Examination:  GENERAL: No apparent distress. Nontoxic.  HEENT: MMM.  Vision and hearing grossly intact.  NECK: Tracheostomy. RESP:  No IWOB.  Fair aeration bilaterally. CVS:  RRR. Heart sounds normal.  ABD/GI/GU: BS present. Soft. Non tender.  MSK/EXT:  Moves extremities. No apparent deformity. No edema.  SKIN: no apparent skin lesion or wound NEURO: Awake and oriented to self.  Follows commands.  No apparent focal neuro deficit. PSYCH: Calm.  Seems to be hallucinating.  Continuously talks   Procedures:  ETT 3/23 >>4/5 Trach 4/5 >>changed to 6 cuffless 4/22 >> L TLC 3/23 >> out ALine L Fem 3/23 >> 3/30  Microbiology summarized: Bcx 3/23 >> negative [name] 3/23 >> negative  BCx2 3/23 >> negative   Assessment & Plan: Massive pulmonary embolism leading to sudden cardiac arrest leading to anoxic brain injury -s/p IR guided mechanical thrombectomy, IVC filter.  -Not on anticoagulation because of associated GI bleed and brain bleed.  Acute respiratory failure with hypoxia due to the above -Tracheostomy removed.  Occlusive dressing applied by PCCM. -PCCM signed off.  Recommended referral to ENT if no stoma closure in 4 weeks.  Acute blood loss anemia due to acute GI bleed: Post  thrombectomy and heparinization, patient started to have melena and fresh blood in OGT. EGD showed acute GI bleed secondary to large gastric ulcer. IR did GDA embolization. Continue PPI.  H&H stable. -We will continue monitoring.  Acute encephalopathy due to anoxic brain injury-oriented to self.  Follows some commands. -Low-dose Seroquel at night. -Frequent reorientation and delirium precautions.   Dysphagia -Was waiting on PEG tube which is in [employer], television/film/video. On TF via cortrak only at night. He is on dysphagia 2 diet.   P.o. intake improving. Might not need PEG.  -Will ask RD for calorie count to make sure -Appreciate input by SLP as well  Leukocytosis: Likely demargination from steroid.  Unclear indication. -Weaning the steroid.  Currently on 10 mg daily  Normocytic anemia: Stable.  Debility/physical deconditioning -PT/OT-recommended SNF.    Nutrition Problem: Inadequate oral intake Etiology: lethargy/confusion  Signs/Symptoms: meal completion < 25%  Interventions: Ensure Enlive (each supplement provides 350kcal and 20 grams of protein), Prostat, Tube feeding       DVT prophylaxis: SCD Code Status: DNR/DNI Family Communication: None at bedside.  Available if any question.  Discharge barrier: Dysphagia.  May need PEG tube unless able to reach goal oral intake. Patient is from: Home Final disposition: SNF  Consultants:  Cardiology, GI, interventional radiology, neurolgoy-all signed off.   Sch Meds:  Scheduled Meds: . chlorhexidine gluconate (MEDLINE KIT)  15 mL Mouth Rinse BID  . Chlorhexidine Gluconate Cloth  6 each Topical Q0600  . feeding supplement (ENSURE ENLIVE)  237 mL Oral TID BM  . feeding supplement (JEVITY 1.2 CAL)  900 mL Per Tube Q24H  . feeding supplement (PRO-STAT SUGAR FREE 64)  30 mL Per Tube Daily  . free water  200 mL Per Tube Q6H  . glycopyrrolate  1 mg Per Tube TID  . metoprolol tartrate  12.5 mg Per NG tube BID  . pantoprazole sodium   40 mg Per Tube BID  . predniSONE  10 mg Oral Q breakfast  . QUEtiapine  12.5 mg Per Tube QHS  . scopolamine  1 patch Transdermal Q72H  . sodium chloride flush  10-40 mL Intracatheter Q12H  . sodium chloride flush  10-40 mL Intracatheter Q12H   Continuous Infusions: . sodium chloride     PRN Meds:.sodium chloride, acetaminophen (TYLENOL) oral liquid 160 mg/5 mL, docusate sodium, gelatin adsorbable, guaiFENesin-dextromethorphan, haloperidol lactate, hydrALAZINE, ipratropium-albuterol, LORazepam, oxyCODONE, sodium chloride flush  Antimicrobials: Anti-infectives (From admission, onward)   Start     Dose/Rate Route Frequency Ordered Stop   03/26/20 1100  doxycycline (VIBRAMYCIN) 100 mg in sodium chloride 0.9 % 250 mL IVPB  Status:  Discontinued     100 mg 125 mL/hr over 120 Minutes Intravenous 2 times daily 03/26/20 1002 04/01/20 1035   03/25/20 1100  doxycycline (VIBRA-TABS) tablet 100 mg  Status:  Discontinued     100 mg Per Tube Every 12 hours 03/25/20 1009 03/26/20 1002   03/25/20 1015  doxycycline (VIBRAMYCIN) 100 mg in sodium chloride 0.9 %  250 mL IVPB  Status:  Discontinued    Note to Pharmacy: To be given after the tracheal aspirate is collected.   100 mg 125 mL/hr over 120 Minutes Intravenous Every 12 hours 03/25/20 1005 03/25/20 1008       I have personally reviewed the following labs and images: CBC: Recent Labs  Lab 04/07/20 0503 04/09/20 1126 04/12/20 0603  WBC 13.1* 13.4* 10.9*  NEUTROABS 11.0* 12.1* 8.5*  HGB 12.3* 12.2* 12.0*  HCT 36.6* 38.5* 36.8*  MCV 91.0 94.8 93.4  PLT 315 246 246   BMP &GFR Recent Labs  Lab 04/05/20 1107 04/06/20 0232 04/06/20 1656 04/07/20 0503 04/07/20 1640 04/09/20 1126 04/09/20 1126 04/09/20 1612 04/10/20 0551 04/11/20 0549 04/11/20 1650 04/12/20 0603  NA  --   --   --  137  --  135  --   --   --   --   --  139  K 4.1 4.1  --  4.7  --  4.8  --   --   --   --   --  4.4  CL  --   --   --  99  --  102  --   --   --   --   --   102  CO2  --   --   --  28  --  22  --   --   --   --   --  26  GLUCOSE  --   --   --  107*  --  129*  --   --   --   --   --  96  BUN  --   --   --  16  --  24*  --   --   --   --   --  28*  CREATININE  --   --   --  0.78  --  0.76  --   --   --   --   --  0.85  CALCIUM  --   --   --  9.3  --  8.7*  --   --   --   --   --  9.2  MG  --   --   --  2.4  --  2.2  --   --   --   --   --  2.2  PHOS  --  4.2   < > 4.1   < > 3.1   < > 3.1 3.9 4.6 3.7 4.4   < > = values in this interval not displayed.   Estimated Creatinine Clearance: 89.6 mL/min (by C-G formula based on SCr of 0.85 mg/dL). Liver & Pancreas: No results for input(s): AST, ALT, ALKPHOS, BILITOT, PROT, ALBUMIN in the last 168 hours. No results for input(s): LIPASE, AMYLASE in the last 168 hours. No results for input(s): AMMONIA in the last 168 hours. Diabetic: No results for input(s): HGBA1C in the last 72 hours. Recent Labs  Lab 04/11/20 1630 04/11/20 1915 04/11/20 2302 04/12/20 0321 04/12/20 0740  GLUCAP 133* 84 101* 88 101*   Cardiac Enzymes: No results for input(s): CKTOTAL, CKMB, CKMBINDEX, TROPONINI in the last 168 hours. No results for input(s): PROBNP in the last 8760 hours. Coagulation Profile: No results for input(s): INR, PROTIME in the last 168 hours. Thyroid Function Tests: No results for input(s): TSH, T4TOTAL, FREET4, T3FREE, THYROIDAB in the last 72 hours. Lipid Profile: No results for input(s): CHOL, HDL,  LDLCALC, TRIG, CHOLHDL, LDLDIRECT in the last 72 hours. Anemia Panel: No results for input(s): VITAMINB12, FOLATE, FERRITIN, TIBC, IRON, RETICCTPCT in the last 72 hours. Urine analysis:    Component Value Date/Time   COLORURINE AMBER (A) 04/11/2020 0153   APPEARANCEUR CLOUDY (A) 04/11/2020 0153   LABSPEC 1.024 04/11/2020 0153   PHURINE 7.0 04/11/2020 0153   GLUCOSEU NEGATIVE 04/11/2020 0153   HGBUR SMALL (A) 04/11/2020 0153   BILIRUBINUR NEGATIVE 04/11/2020 0153   KETONESUR NEGATIVE 04/11/2020  0153   PROTEINUR 100 (A) 04/11/2020 0153   NITRITE NEGATIVE 04/11/2020 0153   LEUKOCYTESUR LARGE (A) 04/11/2020 0153   Sepsis Labs: Invalid input(s): PROCALCITONIN, [place]  Microbiology: Recent Results (from the past 240 hour(s))  Culture, Urine     Status: Abnormal (Preliminary result)   Collection Time: 04/11/20  1:53 AM   Specimen: Urine, Catheterized  Result Value Ref Range Status   Specimen Description URINE, CATHETERIZED  Final   Special Requests NONE  Final   Culture (A)  Final    >=100,000 COLONIES/mL PROTEUS MIRABILIS SUSCEPTIBILITIES TO FOLLOW Performed at [facility], [address], [place], [place] [address]    Report Status PENDING  Incomplete    Radiology Studies: DG Abd Portable 1V  Result Date: 04/11/2020 CLINICAL DATA:  Feeding tube appears dislodged. EXAM: PORTABLE ABDOMEN - 1 VIEW COMPARISON:  04/06/2020 FINDINGS: Feeding tube enters the stomach but is coiled in the fundus, no longer having the tip in the region of the antrum. IVC filter and upper abdominal vascular coils remain in place. Moderate amount of stool. IMPRESSION: Feeding tube now looped in the fundus of the stomach. Electronically Signed   By: Nelson Chimes M.D.   On: 04/11/2020 18:16     Maresha Anastos T. [place]  If 7PM-7AM, please contact night-coverage [email or web address] Password [facility] 04/12/2020, 10:54 AM

## 2020-04-12 NOTE — Progress Notes (Signed)
PT decannulated bt DR Tamala Julian

## 2020-04-13 LAB — URINE CULTURE: Culture: >=100000 — AB

## 2020-04-13 LAB — GLUCOSE, CAPILLARY
Glucose-Capillary: 100 mg/dL — ABNORMAL HIGH (ref 70–99)
Glucose-Capillary: 103 mg/dL — ABNORMAL HIGH (ref 70–99)
Glucose-Capillary: 144 mg/dL — ABNORMAL HIGH (ref 70–99)
Glucose-Capillary: 84 mg/dL (ref 70–99)
Glucose-Capillary: 86 mg/dL (ref 70–99)
Glucose-Capillary: 97 mg/dL (ref 70–99)

## 2020-04-13 LAB — PHOSPHORUS: Phosphorus: 3.6 mg/dL (ref 2.5–4.6)

## 2020-04-13 MED ORDER — METOPROLOL TARTRATE 12.5 MG HALF TABLET
12.5000 mg | ORAL_TABLET | Freq: Two times a day (BID) | ORAL | Status: DC
  Administered 2020-04-13 – 2020-04-15 (×4): 12.5 mg via ORAL
  Filled 2020-04-13 (×5): qty 1

## 2020-04-13 MED ORDER — LORAZEPAM 2 MG/ML IJ SOLN: 1.0000 mg | Freq: Four times a day (QID) | INTRAMUSCULAR | Status: DC | PRN

## 2020-04-13 MED ORDER — ACETAMINOPHEN 325 MG PO TABS
650.0000 mg | ORAL_TABLET | Freq: Four times a day (QID) | ORAL | Status: DC | PRN
  Administered 2020-04-26 – 2020-06-07 (×25): 650 mg via ORAL
  Filled 2020-04-13 (×24): qty 2

## 2020-04-13 MED ORDER — QUETIAPINE FUMARATE 25 MG PO TABS
12.5000 mg | ORAL_TABLET | Freq: Every day | ORAL | Status: DC
  Administered 2020-04-13: 12.5 mg via ORAL
  Filled 2020-04-13 (×2): qty 1

## 2020-04-13 MED ORDER — GUAIFENESIN-DM 100-10 MG/5ML PO SYRP
10.0000 mL | ORAL_SOLUTION | ORAL | Status: DC | PRN
  Administered 2020-05-05 – 2020-05-17 (×4): 10 mL via ORAL
  Filled 2020-04-13 (×5): qty 10

## 2020-04-13 MED ORDER — PANTOPRAZOLE SODIUM 40 MG PO TBEC
40.0000 mg | DELAYED_RELEASE_TABLET | Freq: Two times a day (BID) | ORAL | Status: DC
  Administered 2020-04-13 – 2020-06-12 (×116): 40 mg via ORAL
  Filled 2020-04-13 (×6): qty 1
  Filled 2020-04-13: qty 2
  Filled 2020-04-13 (×111): qty 1

## 2020-04-13 MED ORDER — GLYCOPYRROLATE 1 MG PO TABS
1.0000 mg | ORAL_TABLET | Freq: Three times a day (TID) | ORAL | Status: DC
  Administered 2020-04-13 – 2020-04-20 (×19): 1 mg via ORAL
  Filled 2020-04-13 (×20): qty 1

## 2020-04-13 NOTE — Progress Notes (Signed)
Patient was calm and resting around 12:30 pm and he was agreeable to try and take a wheelchair ride around the unit.  We talked about the possibility of going outside sometime in the future and seemed excited about that.  He transferred with 2 assist to wheelchair and rode down the hall and back in a w/c.  He wanted to go back to the room and was a little agitated getting back to bed.  Belt restraint is continued because patient was trying to to get out of bed and pull of his condom cath.  Mitts also continued.  Will continue to try to collaborate with staff to increase activities.

## 2020-04-13 NOTE — Progress Notes (Signed)
Notified by lab tech that pt refused lab draw.

## 2020-04-13 NOTE — Progress Notes (Signed)
PROGRESS NOTE  Kenneth Sullivan FGH:[id number] DOB: March 20, 1956   PCP: Medicine, [name]  Patient is from: home  DOA: 03/07/2020 LOS: 75  Brief Narrative / Interim history: 64 year old with unknown medical history, not seen a physician for several years.  He presented to the ED on 3/23 after an acute onset shortness of breath with oxygen saturation on 40% by EMS. On site, he had witnessed bradycardic event leading to asystole/cardiac arrest.  ROSC achieved in 20 minutes with 3 rounds of epinephrine.  Brought to ED intubated.  Admitted to ICU. CTPA showed significantly massive PE/DVT and RV strain.  He underwent IR guided mechanical thrombectomy and IVC filter. Postprocedure, while on heparin drip, patient started having maroon-colored stool and bloody drainage from OGT.  GI did EGD which showed oozing gastric ulcer with clot. IR did GDA embolization  Patient remained encephalopathic. 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   4/5, underwent tracheostomy.  Patient is waiting for a PEG tube placement but PEG tube is in national shortage so he has not been able to get it done.  He is currently on core track feeding per nutritionist.  Also on dysphagia 2 diet but appetite is low. Remains on continuous tube feed at 75 cc an hour.  Subjective: Seen and examined earlier this morning.  No major events overnight or this morning.  He says "let me eat".  Per RN, poor appetite.  Eats less than 25% of his meals.  Remains on continuous tube feed at 75 cc an hour.  He denies pain.  He is oriented to self and partial place.  He gets irritated easily.  Objective: Vitals:   04/12/20 2025 04/13/20 0105 04/13/20 0405 04/13/20 0435  BP: 127/84 (!)  86/67 91/66 101/74  Pulse: (!) 108     Resp: _0 Temp: (!) 97.4 F (36.3 C) (!) 97.5 F (36.4 C) 97.6 F (36.4 C)   TempSrc: Oral Axillary Axillary   SpO2: 97%  91%   Weight:      Height:        Intake/Output Summary (Last 24 hours) at 04/13/2020 1110 Last data filed at 04/13/2020 0622 Gross per 24 hour  Intake 1167.5 ml  Output 1000 ml  Net 167.5 ml   Filed Weights   03/23/20 0342 03/25/20 0500 03/27/20 0500  Weight: 77.8 kg 77.7 kg 70.3 kg    Examination:  GENERAL: No apparent distress.  Nontoxic. HEENT: MMM.  Vision and hearing grossly intact.  Cortak in place. NECK: Supple.  No apparent JVD.  RESP:  No IWOB.  Fair aeration bilaterally. CVS:  RRR. Heart sounds normal.  ABD/GI/GU: Bowel sounds present. Soft. Non tender.  MSK/EXT:  Moves extremities. No apparent deformity. No edema.  SKIN: no apparent skin lesion or wound NEURO: Awake, alert and oriented self and partial place.  Follows commands.  No apparent focal neuro deficit. PSYCH: Poor insight.  Gets irritated easily.   Procedures:  ETT 3/23 >>4/5 Trach 4/5 >>changed to 6 cuffless 4/22 >> L TLC 3/23 >> out ALine L Fem 3/23 >> 3/30  Microbiology summarized: Bcx 3/23 >> negative Kenneth Sullivan 3/23 >> negative BCx2 3/23 >> negative   Assessment & Plan: Massive pulmonary embolism leading to sudden cardiac arrest leading to anoxic brain injury -s/p IR guided mechanical thrombectomy,  IVC filter.  -Not on anticoagulation because of associated GI bleed and brain bleed.  Acute respiratory failure with hypoxia due to the above -Decannulated on 4/28. PCCM signed off.  Recommended referral to ENT if no stoma closure in 4 weeks.  Acute blood loss anemia due to acute GI bleed: patient started to have melena and fresh blood in OGT post thrombectomy and heparinization. EGD showed acute GI bleed secondary to large gastric ulcer. IR did GDA embolization. Continue PPI.  H&H stable. -Monitor H&H. -Check anemia  panel  Acute encephalopathy due to anoxic brain injury-oriented to self and partial place.  Follows some commands. -Low-dose Seroquel at night. -Frequent reorientation and delirium precautions.  Dysphagia -Upgraded to dysphagia 2 diet by SLP but continues to have poor appetite. -On 24-hour TF via cortrak.  PEG tube on national shortage.  -Continue calorie count  Leukocytosis: Likely demargination from steroid.  Unclear indication. -Weaning the steroid.  Currently on 10 mg daily  Debility/physical deconditioning -PT/OT-recommended SNF.    Nutrition Problem: Inadequate oral intake Etiology: lethargy/confusion  Signs/Symptoms: meal completion < 25%  Interventions: Ensure Enlive (each supplement provides 350kcal and 20 grams of protein), Prostat, Tube feeding       DVT prophylaxis: SCD Code Status: DNR/DNI Family Communication: Attempted to call Kenneth Sullivan, Niece for update but no answer.  Status is: Inpatient  Remains inpatient appropriate because: Patient is on continuous tube feed via cortrak due to poor/inadequate oral intake and has not PEG tube due to national shortage.  Dispo: The patient is from: Home              Anticipated d/c is to: SNF              Anticipated d/c date is: > 3 days              Patient currently medically stable but needs reliable means of feeding/PEG tube to be discharged safely.  Consultants:  Cardiology, GI, interventional radiology, neurolgoy-all signed off.   Sch Meds:  Scheduled Meds: . chlorhexidine gluconate (MEDLINE KIT)  15 mL Mouth Rinse BID  . Chlorhexidine Gluconate Cloth  6 each Topical Q0600  . feeding supplement (ENSURE ENLIVE)  237 mL Oral TID BM  . feeding supplement (JEVITY 1.2 CAL)  900 mL Per Tube Q24H  . feeding supplement (PRO-STAT SUGAR FREE 64)  30 mL Per Tube Daily  . free water  200 mL Per Tube Q6H  . glycopyrrolate  1 mg Per Tube TID  . metoprolol tartrate  12.5 mg Per NG tube BID  . pantoprazole sodium  40  mg Per Tube BID  . predniSONE  10 mg Oral Q breakfast  . QUEtiapine  12.5 mg Per Tube QHS  . scopolamine  1 patch Transdermal Q72H  . sodium chloride flush  10-40 mL Intracatheter Q12H   Continuous Infusions: . sodium chloride     PRN Meds:.sodium chloride, acetaminophen (TYLENOL) oral liquid 160 mg/5 mL, docusate sodium, gelatin adsorbable, guaiFENesin-dextromethorphan, haloperidol lactate, hydrALAZINE, ipratropium-albuterol, LORazepam, oxyCODONE, sodium chloride flush  Antimicrobials: Anti-infectives (From admission, onward)   Start     Dose/Rate Route Frequency Ordered Stop   03/26/20 1100  doxycycline (VIBRAMYCIN) 100 mg in sodium chloride 0.9 % 250 mL IVPB  Status:  Discontinued     100 mg 125 mL/hr over 120 Minutes Intravenous 2 times daily 03/26/20 1002 04/01/20 1035   03/25/20 1100  doxycycline (VIBRA-TABS) tablet 100 mg  Status:  Discontinued     100 mg Per Tube Every  12 hours 03/25/20 1009 03/26/20 1002   03/25/20 1015  doxycycline (VIBRAMYCIN) 100 mg in sodium chloride 0.9 % 250 mL IVPB  Status:  Discontinued    Note to Pharmacy: To be given after the tracheal aspirate is collected.   100 mg 125 mL/hr over 120 Minutes Intravenous Every 12 hours 03/25/20 1005 03/25/20 1008       I have personally reviewed the following labs and images: CBC: Recent Labs  Lab 04/07/20 0503 04/09/20 1126 04/12/20 0603  WBC 13.1* 13.4* 10.9*  NEUTROABS 11.0* 12.1* 8.5*  HGB 12.3* 12.2* 12.0*  HCT 36.6* 38.5* 36.8*  MCV 91.0 94.8 93.4  PLT 315 246 246   BMP &GFR Recent Labs  Lab 04/07/20 0503 04/07/20 1640 04/09/20 1126 04/09/20 1612 04/10/20 0551 04/11/20 0549 04/11/20 1650 04/12/20 0603 04/12/20 2123  NA 137  --  135  --   --   --   --  139  --   K 4.7  --  4.8  --   --   --   --  4.4  --   CL 99  --  102  --   --   --   --  102  --   CO2 28  --  22  --   --   --   --  26  --   GLUCOSE 107*  --  129*  --   --   --   --  96  --   BUN 16  --  24*  --   --   --   --  28*   --   CREATININE 0.78  --  0.76  --   --   --   --  0.85  --   CALCIUM 9.3  --  8.7*  --   --   --   --  9.2  --   MG 2.4  --  2.2  --   --   --   --  2.2  --   PHOS 4.1   < > 3.1   < > 3.9 4.6 3.7 4.4 3.8   < > = values in this interval not displayed.   Estimated Creatinine Clearance: 89.6 mL/min (by C-G formula based on SCr of 0.85 mg/dL). Liver & Pancreas: No results for input(s): AST, ALT, ALKPHOS, BILITOT, PROT, ALBUMIN in the last 168 hours. No results for input(s): LIPASE, AMYLASE in the last 168 hours. No results for input(s): AMMONIA in the last 168 hours. Diabetic: No results for input(s): HGBA1C in the last 72 hours. Recent Labs  Lab 04/12/20 1619 04/12/20 1909 04/12/20 2339 04/13/20 0308 04/13/20 0804  GLUCAP 149* 88 94 103* 97   Cardiac Enzymes: No results for input(s): CKTOTAL, CKMB, CKMBINDEX, TROPONINI in the last 168 hours. No results for input(s): PROBNP in the last 8760 hours. Coagulation Profile: No results for input(s): INR, PROTIME in the last 168 hours. Thyroid Function Tests: No results for input(s): TSH, T4TOTAL, FREET4, T3FREE, THYROIDAB in the last 72 hours. Lipid Profile: No results for input(s): CHOL, HDL, LDLCALC, TRIG, CHOLHDL, LDLDIRECT in the last 72 hours. Anemia Panel: No results for input(s): VITAMINB12, FOLATE, FERRITIN, TIBC, IRON, RETICCTPCT in the last 72 hours. Urine analysis:    Component Value Date/Time   COLORURINE AMBER (A) 04/11/2020 0153   APPEARANCEUR CLOUDY (A) 04/11/2020 0153   LABSPEC 1.024 04/11/2020 0153   PHURINE 7.0 04/11/2020 0153   GLUCOSEU NEGATIVE 04/11/2020 0153   HGBUR SMALL (A)  04/11/2020 0153   BILIRUBINUR NEGATIVE 04/11/2020 0153   KETONESUR NEGATIVE 04/11/2020 0153   PROTEINUR 100 (A) 04/11/2020 0153   NITRITE NEGATIVE 04/11/2020 0153   LEUKOCYTESUR LARGE (A) 04/11/2020 0153   Sepsis Labs: Invalid input(s): PROCALCITONIN, [place]  Microbiology: Recent Results (from the past 240 hour(s))  Culture,  Urine     Status: Abnormal   Collection Time: 04/11/20  1:53 AM   Specimen: Urine, Catheterized  Result Value Ref Range Status   Specimen Description   Final    URINE, CATHETERIZED Performed at [facility], [address], [place], [place] [address]    Special Requests NONE  Final   Culture >=100,000 COLONIES/mL PROTEUS MIRABILIS (A)  Final   Report Status 04/13/2020 FINAL  Final   Organism ID, Bacteria PROTEUS MIRABILIS (A)  Final      Susceptibility   Proteus mirabilis - MIC*    AMPICILLIN <=2 SENSITIVE Sensitive     CEFAZOLIN <=4 SENSITIVE Sensitive     CEFTRIAXONE <=1 SENSITIVE Sensitive     CIPROFLOXACIN <=0.25 SENSITIVE Sensitive     GENTAMICIN <=1 SENSITIVE Sensitive     IMIPENEM 1 SENSITIVE Sensitive     NITROFURANTOIN 128 RESISTANT Resistant     TRIMETH/SULFA <=20 SENSITIVE Sensitive     AMPICILLIN/SULBACTAM <=2 SENSITIVE Sensitive     PIP/TAZO <=4 SENSITIVE Sensitive     * >=100,000 COLONIES/mL PROTEUS MIRABILIS    Radiology Studies: No results found.   Ronasia Isola T. [place]  If 7PM-7AM, please contact night-coverage [email or web address] Password [email or web address] 04/13/2020, 11:10 AM

## 2020-04-14 LAB — RENAL FUNCTION PANEL
Albumin: 2.6 g/dL — ABNORMAL LOW (ref 3.5–5.0)
Anion gap: 13 (ref 5–15)
BUN: 20 mg/dL (ref 8–23)
CO2: 27 mmol/L (ref 22–32)
Calcium: 9 mg/dL (ref 8.9–10.3)
Chloride: 98 mmol/L (ref 98–111)
Creatinine, Ser: 0.79 mg/dL (ref 0.61–1.24)
GFR calc Af Amer: >60 mL/min (ref >60)
GFR calc non Af Amer: >60 mL/min (ref >60)
Glucose, Bld: 97 mg/dL (ref 70–99)
Phosphorus: 4.2 mg/dL (ref 2.5–4.6)
Potassium: 4.1 mmol/L (ref 3.5–5.1)
Sodium: 138 mmol/L (ref 135–145)

## 2020-04-14 LAB — GLUCOSE, CAPILLARY
Glucose-Capillary: 90 mg/dL (ref 70–99)
Glucose-Capillary: 120 mg/dL — ABNORMAL HIGH (ref 70–99)
Glucose-Capillary: 106 mg/dL — ABNORMAL HIGH (ref 70–99)
Glucose-Capillary: 96 mg/dL (ref 70–99)
Glucose-Capillary: 94 mg/dL (ref 70–99)

## 2020-04-14 LAB — PHOSPHORUS: Phosphorus: 3.9 mg/dL (ref 2.5–4.6)

## 2020-04-14 LAB — MAGNESIUM: Magnesium: 2.1 mg/dL (ref 1.7–2.4)

## 2020-04-14 MED ORDER — PREDNISONE 5 MG PO TABS
5.0000 mg | ORAL_TABLET | Freq: Every day | ORAL | Status: DC
  Administered 2020-04-15 – 2020-04-17 (×2): 5 mg via ORAL
  Filled 2020-04-14 (×2): qty 1

## 2020-04-14 NOTE — Plan of Care (Signed)
  Problem: Activity: Goal: Ability to tolerate increased activity will improve Outcome: Progressing   Problem: Respiratory: Goal: Ability to maintain a clear airway and adequate ventilation will improve Outcome: Progressing   Problem: Respiratory: Goal: Ability to maintain a clear airway and adequate ventilation will improve Outcome: Progressing   Problem: Role Relationship: Goal: Method of communication will improve Outcome: Progressing   Problem: Clinical Measurements: Goal: Will remain free from infection Outcome: Progressing Goal: Respiratory complications will improve Outcome: Progressing   Problem: Nutrition: Goal: Adequate nutrition will be maintained Outcome: Progressing   Problem: Pain Managment: Goal: General experience of comfort will improve Outcome: Progressing   Problem: Safety: Goal: Ability to remain free from injury will improve Outcome: Progressing   Problem: Skin Integrity: Goal: Risk for impaired skin integrity will decrease Outcome: Progressing   Problem: Education: Goal: Knowledge of General Education information will improve Description: Including pain rating scale, medication(s)/side effects and non-pharmacologic comfort measures Outcome: Not Progressing   Problem: Health Behavior/Discharge Planning: Goal: Ability to manage health-related needs will improve Outcome: Not Progressing

## 2020-04-14 NOTE — Progress Notes (Addendum)
Pt talking to people in room who are not there, but is open to reorientation, although it doesn't always happen. The only hallucination that the pt was upset by and could not reorient from was 'seeing' a bottle of alcohol in the room and was irate when he was told that it could not be given to him.   Pt also took PIV out - IV team has relayed that the pt has pulled out multiple USG PIV's and a midline - on call paged. OK to leave PIV out for tonight.   20: New order obtained for lap belt restraint. Lap belt in place, and mittens continued.

## 2020-04-14 NOTE — Progress Notes (Signed)
Physical Therapy Treatment Patient Details Name: Kenneth Sullivan MRN: [id number] DOB: 31-Jan-1956 Today's Date: 04/14/2020    History of Present Illness Pt is 64 yo male with unknown medical hx.  He presented to ED on 3/23 with acute SHOB.  He had bradycardiac event that lead to asystole/cardiac arrest (ROSC achieved in 20 mins with 3 rounds epinephrine).  Pt was intubated and admitted to ICU.  Pt with massive PE/DVT and RV strain.  He underwent IR guided mechanical thrombectomy and IVC Filter.   Pt developed gastric ulcer and required GDA embolization.  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 Pt s/p trach on 4/5 and changed to cuffless on 4/22.  Currently, awaiting possible PEG.    PT Comments    Pt more cooperative and less agitated/combatative today.  He was able to ambulate in hall with HHA of 2 with moderate unsteadiness.  Pt required increased time for encouragement and cues for transfers.  Continue to recommend assist of 2 for safety due to uncertainty of pt's behavior and ability to follow commands.     Follow Up Recommendations  SNF     Equipment Recommendations  Rolling walker with 5" wheels    Recommendations for Other Services       Precautions / Restrictions Precautions Precautions: Fall    Mobility  Bed Mobility Overal bed mobility: Needs Assistance Bed Mobility: Supine to Sit;Sit to Supine     Supine to sit: Min guard Sit to supine: Min guard   General bed mobility comments: min guard for safety  Transfers Overall transfer level: Needs assistance Equipment used: 2 person hand held assist Transfers: Sit to/from Stand Sit to Stand: Min assist;+2 safety/equipment         General transfer comment: cues for  sequencing  Ambulation/Gait Ambulation/Gait assistance: Min assist;+2 safety/equipment Gait Distance (Feet): 80 Feet Assistive device: 2 person hand held assist Gait Pattern/deviations: Step-through pattern;Decreased stride length;Scissoring;Staggering right;Staggering left     General Gait Details: pt unsteady requiring assist of 2 for safety; cued for direction and provided facilitation with guarding at times   Stairs             Wheelchair Mobility    Modified Rankin (Stroke Patients Only)       Balance Overall balance assessment: Needs assistance Sitting-balance support: No upper extremity supported;Feet supported Sitting balance-Leahy Scale: Fair     Standing balance support: Bilateral upper extremity supported Standing balance-Leahy Scale: Fair                              Cognition Arousal/Alertness: Awake/alert Behavior During Therapy: Restless;Anxious;Impulsive Overall Cognitive Status: No family/caregiver present to determine baseline cognitive functioning Area of Impairment: Orientation;Attention;Memory;Problem solving;Following commands;Safety/judgement;Awareness                 Orientation Level: Disoriented to;Place;Time;Situation   Memory: Decreased recall of precautions;Decreased short-term memory Following Commands: Follows one step commands inconsistently Safety/Judgement: Decreased awareness of safety;Decreased awareness of deficits   Problem Solving: Difficulty sequencing;Requires tactile cues;Requires verbal cues General Comments: Pt reporting that he saw a snake go in his closet.  Reassured and checked pt's closet to comfort him.      Exercises      General Comments General comments (skin integrity, edema, etc.): VSS  Pertinent Vitals/Pain Pain Assessment: No/denies pain    Home Living                      Prior Function            PT Goals (current goals can now be found in the care plan  section) Progress towards PT goals: Progressing toward goals    Frequency    Min 2X/week      PT Plan Current plan remains appropriate    Co-evaluation              AM-PAC PT "6 Clicks" Mobility   Outcome Measure  Help needed turning from your back to your side while in a flat bed without using bedrails?: A Little Help needed moving from lying on your back to sitting on the side of a flat bed without using bedrails?: A Little Help needed moving to and from a bed to a chair (including a wheelchair)?: A Little Help needed standing up from a chair using your arms (e.g., wheelchair or bedside chair)?: A Little Help needed to walk in hospital room?: A Little Help needed climbing 3-5 steps with a railing? : A Lot 6 Click Score: 17    End of Session Equipment Utilized During Treatment: Gait belt Activity Tolerance: Patient tolerated treatment well Patient left: in bed;with call bell/phone within reach;with restraints reapplied;with bed alarm set Nurse Communication: Mobility status(notified RN that feeding tube was not running at PT arrival) PT Visit Diagnosis: Unsteadiness on feet (R26.81);Other abnormalities of gait and mobility (R26.89)     Time: [id number] PT Time Calculation (min) (ACUTE ONLY): 18 min  Charges:  $Gait Training: 8-22 mins                     Maggie Font, PT Acute Rehab Services Pager [phone] [facility] [phone] [facility] [phone]    [name] 04/14/2020, 3:38 PM

## 2020-04-14 NOTE — Plan of Care (Signed)
  Problem: Activity: Goal: Ability to tolerate increased activity will improve Outcome: Progressing   Problem: Respiratory: Goal: Ability to maintain a clear airway and adequate ventilation will improve Outcome: Progressing   Problem: Role Relationship: Goal: Method of communication will improve Outcome: Progressing   Problem: Activity: Goal: Ability to tolerate increased activity will improve Outcome: Progressing   Problem: Respiratory: Goal: Ability to maintain a clear airway and adequate ventilation will improve Outcome: Progressing   Problem: Role Relationship: Goal: Method of communication will improve Outcome: Progressing   Problem: Education: Goal: Knowledge of General Education information will improve Description: Including pain rating scale, medication(s)/side effects and non-pharmacologic comfort measures Outcome: Progressing   Problem: Health Behavior/Discharge Planning: Goal: Ability to manage health-related needs will improve Outcome: Progressing   Problem: Clinical Measurements: Goal: Will remain free from infection Outcome: Progressing Goal: Diagnostic test results will improve Outcome: Progressing Goal: Respiratory complications will improve Outcome: Progressing Goal: Cardiovascular complication will be avoided Outcome: Progressing   Problem: Activity: Goal: Risk for activity intolerance will decrease Outcome: Progressing   Problem: Nutrition: Goal: Adequate nutrition will be maintained Outcome: Progressing   Problem: Coping: Goal: Level of anxiety will decrease Outcome: Progressing   Problem: Elimination: Goal: Will not experience complications related to bowel motility Outcome: Progressing Goal: Will not experience complications related to urinary retention Outcome: Progressing   Problem: Pain Managment: Goal: General experience of comfort will improve Outcome: Progressing   Problem: Safety: Goal: Ability to remain free from injury  will improve Outcome: Progressing   Problem: Skin Integrity: Goal: Risk for impaired skin integrity will decrease Outcome: Progressing

## 2020-04-14 NOTE — Progress Notes (Signed)
Patient attempted to get out of bed, pulled cortrack out 40cm. TF stopped, Dr. Cyndia Skeeters T, notified.

## 2020-04-14 NOTE — Progress Notes (Signed)
PROGRESS NOTE  Kenneth Sullivan [id number] DOB: 27-Aug-1956   PCP: Medicine, [name]  Patient is from: home  DOA: 03/07/2020 LOS: 62  Brief Narrative / Interim history: 64 year old with unknown medical history, not seen a physician for several years.  He presented to the ED on 3/23 after an acute onset shortness of breath with oxygen saturation on 40% by EMS. On site, he had witnessed bradycardic event leading to asystole/cardiac arrest.  ROSC achieved in 20 minutes with 3 rounds of epinephrine.  Brought to ED intubated.  Admitted to ICU. CTPA showed significantly massive PE/DVT and RV strain.  He underwent IR guided mechanical thrombectomy and IVC filter. Postprocedure, while on heparin drip, patient started having maroon-colored stool and bloody drainage from OGT.  GI did EGD which showed oozing gastric ulcer with clot. IR did GDA embolization  Patient remained encephalopathic. 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   4/5, underwent tracheostomy.  Patient is waiting for a PEG tube placement but PEG tube is in Producer, television/film/video. He is currently on tube feed via cortrak.  He is also on dysphagia 2 diet but appetite is low. Remains on continuous tube feed at 75 cc an hour.  Subjective: Seen and examined earlier this morning.  No major events overnight or this morning.  Is awake and alert but only oriented to self.  He thinks he is at [facility].  He says "I was supposed to be married today".  Does not appear to be in distress.  Objective: Vitals:   04/13/20 1644 04/13/20 2017 04/13/20 2337 04/14/20 0410  BP: (!) 170/82 122/84 (!) 133/94 109/64  Pulse: 62 (!) 104 (!) 101 84  Resp: _0 Temp: (!) 97.4 F  (36.3 C) 97.9 F (36.6 C) 98.1 F (36.7 C) 98.2 F (36.8 C)  TempSrc:  Oral Oral Oral  SpO2: 100% 100% 96% 95%  Weight:      Height:       No intake or output data in the 24 hours ending 04/14/20 1011 Filed Weights   03/23/20 0342 03/25/20 0500 03/27/20 0500  Weight: 77.8 kg 77.7 kg 70.3 kg    Examination:  GENERAL: No apparent distress.  Nontoxic. HEENT: MMM.  Vision and hearing grossly intact. Cortrak in place NECK: Supple.  No apparent JVD.  RESP: 95% on room air.  No IWOB.  Fair aeration bilaterally. CVS:  RRR. Heart sounds normal.  ABD/GI/GU: Bowel sounds present. Soft. Non tender.  MSK/EXT:  Moves extremities. No apparent deformity. No edema.  SKIN: no apparent skin lesion or wound NEURO: Awake, alert but only oriented to self.  No apparent focal neuro deficit. 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  -Not on anticoagulation because of associated GI bleed and brain bleed.  Acute respiratory failure with hypoxia due to the above -Decannulated on 4/28. PCCM signed off.  Recommended referral to ENT if no stoma closure  in 4 weeks.  Acute blood loss anemia due to acute GI bleed: patient started to have melena and fresh blood in OGT post thrombectomy and heparinization. EGD showed acute GI bleed secondary to large gastric ulcer. IR did GDA embolization. Continue PPI.  H&H stable. -Monitor H&H. -Check anemia panel  Acute encephalopathy due to anoxic brain injury-oriented to self but confused.  Visual hallucination and agitation at times.  Follows some commands. -Low-dose Seroquel at  night and as needed Haldol -Continue mittens and lap belt -Frequent reorientation and delirium precautions.  Dysphagia -Upgraded to dysphagia 2 diet by SLP but continues to have poor appetite. -On 24-hour TF via cortrak.  PEG tube on national shortage.  -Continue calorie count  Leukocytosis: Likely demargination from steroid.  Unclear indication. -Weaning the steroid.  Down to 5 mg starting 5/1.  Debility/physical deconditioning -PT/OT-recommended SNF.    Nutrition Problem: Inadequate oral intake Etiology: lethargy/confusion  Signs/Symptoms: meal completion < 25%  Interventions: Ensure Enlive (each supplement provides 350kcal and 20 grams of protein), Prostat, Tube feeding       DVT prophylaxis: SCD Code Status: DNR/DNI Family Communication: Attempted to call Gloria, Niece for update but no answer.  Status is: Inpatient  Remains inpatient appropriate because: Patient is on continuous tube feed via cortrak due to poor/inadequate oral intake and has not had PEG tube due to national shortage.  Dispo: The patient is from: Home              Anticipated d/c is to: SNF              Anticipated d/c date is: > 3 days              Patient currently medically stable but needs reliable means of feeding/PEG tube to be discharged safely unless oral intake improves.  Consultants:  Cardiology, GI, interventional radiology, neurolgoy-all signed off.   Sch Meds:  Scheduled Meds: . chlorhexidine gluconate (MEDLINE KIT)  15 mL Mouth Rinse BID  . Chlorhexidine Gluconate Cloth  6 each Topical Q0600  . feeding supplement (ENSURE ENLIVE)  237 mL Oral TID BM  . feeding supplement (JEVITY 1.2 CAL)  900 mL Per Tube Q24H  . feeding supplement (PRO-STAT SUGAR FREE 64)  30 mL Per Tube Daily  . free water  200 mL Per Tube Q6H  . glycopyrrolate  1 mg Oral TID  . metoprolol tartrate  12.5 mg Oral BID  . pantoprazole  40 mg Oral BID  . predniSONE  10 mg Oral Q breakfast  . QUEtiapine  12.5 mg  Oral QHS  . scopolamine  1 patch Transdermal Q72H  . sodium chloride flush  10-40 mL Intracatheter Q12H   Continuous Infusions: . sodium chloride     PRN Meds:.sodium chloride, acetaminophen, docusate sodium, gelatin adsorbable, guaiFENesin-dextromethorphan, haloperidol lactate, hydrALAZINE, ipratropium-albuterol, LORazepam, oxyCODONE, sodium chloride flush  Antimicrobials: Anti-infectives (From admission, onward)   Start     Dose/Rate Route Frequency Ordered Stop   03/26/20 1100  doxycycline (VIBRAMYCIN) 100 mg in sodium chloride 0.9 % 250 mL IVPB  Status:  Discontinued     100 mg 125 mL/hr over 120 Minutes Intravenous 2 times daily 03/26/20 1002 04/01/20 1035   03/25/20 1100  doxycycline (VIBRA-TABS) tablet 100 mg  Status:  Discontinued     100 mg Per Tube Every 12 hours 03/25/20 1009 03/26/20 1002   03/25/20 1015  doxycycline (VIBRAMYCIN) 100 mg in sodium chloride 0.9 % 250 mL IVPB  Status:  Discontinued      Note to Pharmacy: To be given after the tracheal aspirate is collected.   100 mg 125 mL/hr over 120 Minutes Intravenous Every 12 hours 03/25/20 1005 03/25/20 1008       I have personally reviewed the following labs and images: CBC: Recent Labs  Lab 04/09/20 1126 04/12/20 0603  WBC 13.4* 10.9*  NEUTROABS 12.1* 8.5*  HGB 12.2* 12.0*  HCT 38.5* 36.8*  MCV 94.8 93.4  PLT 246 246   BMP &GFR Recent Labs  Lab 04/09/20 1126 04/09/20 1612 04/11/20 1650 04/12/20 0603 04/12/20 2123 04/13/20 1658 04/14/20 0403  NA 135  --   --  139  --   --  138  K 4.8  --   --  4.4  --   --  4.1  CL 102  --   --  102  --   --  98  CO2 22  --   --  26  --   --  27  GLUCOSE 129*  --   --  96  --   --  97  BUN 24*  --   --  28*  --   --  20  CREATININE 0.76  --   --  0.85  --   --  0.79  CALCIUM 8.7*  --   --  9.2  --   --  9.0  MG 2.2  --   --  2.2  --   --  2.1  PHOS 3.1   < > 3.7 4.4 3.8 3.6 4.2   < > = values in this interval not displayed.   Estimated Creatinine Clearance:  95.2 mL/min (by C-G formula based on SCr of 0.79 mg/dL). Liver & Pancreas: Recent Labs  Lab 04/14/20 0403  ALBUMIN 2.6*   No results for input(s): LIPASE, AMYLASE in the last 168 hours. No results for input(s): AMMONIA in the last 168 hours. Diabetic: No results for input(s): HGBA1C in the last 72 hours. Recent Labs  Lab 04/13/20 1616 04/13/20 2013 04/13/20 2338 04/14/20 0411 04/14/20 0800  GLUCAP 100* 86 84 90 120*   Cardiac Enzymes: No results for input(s): CKTOTAL, CKMB, CKMBINDEX, TROPONINI in the last 168 hours. No results for input(s): PROBNP in the last 8760 hours. Coagulation Profile: No results for input(s): INR, PROTIME in the last 168 hours. Thyroid Function Tests: No results for input(s): TSH, T4TOTAL, FREET4, T3FREE, THYROIDAB in the last 72 hours. Lipid Profile: No results for input(s): CHOL, HDL, LDLCALC, TRIG, CHOLHDL, LDLDIRECT in the last 72 hours. Anemia Panel: No results for input(s): VITAMINB12, FOLATE, FERRITIN, TIBC, IRON, RETICCTPCT in the last 72 hours. Urine analysis:    Component Value Date/Time   COLORURINE AMBER (A) 04/11/2020 0153   APPEARANCEUR CLOUDY (A) 04/11/2020 0153   LABSPEC 1.024 04/11/2020 0153   PHURINE 7.0 04/11/2020 0153   GLUCOSEU NEGATIVE 04/11/2020 0153   HGBUR SMALL (A) 04/11/2020 0153   BILIRUBINUR NEGATIVE 04/11/2020 0153   KETONESUR NEGATIVE 04/11/2020 0153   PROTEINUR 100 (A) 04/11/2020 0153   NITRITE NEGATIVE 04/11/2020 0153   LEUKOCYTESUR LARGE (A) 04/11/2020 0153   Sepsis Labs: Invalid input(s): PROCALCITONIN, LACTICIDVEN  Microbiology: Recent Results (from the past 240 hour(s))  Culture, Urine     Status: Abnormal   Collection Time: 04/11/20  1:53 AM   Specimen: Urine, Catheterized  Result Value Ref Range Status   Specimen Description   Final    URINE, CATHETERIZED Performed at [facility], [address] [place], [place] [address]      Special Requests NONE  Final   Culture >=100,000 COLONIES/mL  PROTEUS MIRABILIS (A)  Final   Report Status 04/13/2020 FINAL  Final   Organism ID, Bacteria PROTEUS MIRABILIS (A)  Final      Susceptibility   Proteus mirabilis - MIC*    AMPICILLIN <=2 SENSITIVE Sensitive     CEFAZOLIN <=4 SENSITIVE Sensitive     CEFTRIAXONE <=1 SENSITIVE Sensitive     CIPROFLOXACIN <=0.25 SENSITIVE Sensitive     GENTAMICIN <=1 SENSITIVE Sensitive     IMIPENEM 1 SENSITIVE Sensitive     NITROFURANTOIN 128 RESISTANT Resistant     TRIMETH/SULFA <=20 SENSITIVE Sensitive     AMPICILLIN/SULBACTAM <=2 SENSITIVE Sensitive     PIP/TAZO <=4 SENSITIVE Sensitive     * >=100,000 COLONIES/mL PROTEUS MIRABILIS    Radiology Studies: No results found.   Karesha Trzcinski T. Stow  If 7PM-7AM, please contact night-coverage [email or web address] Password [facility] 04/14/2020, 10:11 AM

## 2020-04-15 LAB — MAGNESIUM: Magnesium: 2.2 mg/dL (ref 1.7–2.4)

## 2020-04-15 LAB — GLUCOSE, CAPILLARY
Glucose-Capillary: 110 mg/dL — ABNORMAL HIGH (ref 70–99)
Glucose-Capillary: 84 mg/dL (ref 70–99)
Glucose-Capillary: 84 mg/dL (ref 70–99)
Glucose-Capillary: 93 mg/dL (ref 70–99)
Glucose-Capillary: 94 mg/dL (ref 70–99)

## 2020-04-15 LAB — RENAL FUNCTION PANEL
Albumin: 3.3 g/dL — ABNORMAL LOW (ref 3.5–5.0)
Anion gap: 11 (ref 5–15)
BUN: 21 mg/dL (ref 8–23)
CO2: 27 mmol/L (ref 22–32)
Calcium: 9.6 mg/dL (ref 8.9–10.3)
Chloride: 101 mmol/L (ref 98–111)
Creatinine, Ser: 0.79 mg/dL (ref 0.61–1.24)
GFR calc Af Amer: >60 mL/min (ref >60)
GFR calc non Af Amer: >60 mL/min (ref >60)
Glucose, Bld: 99 mg/dL (ref 70–99)
Phosphorus: 4.1 mg/dL (ref 2.5–4.6)
Potassium: 4.1 mmol/L (ref 3.5–5.1)
Sodium: 139 mmol/L (ref 135–145)

## 2020-04-15 LAB — PHOSPHORUS: Phosphorus: 3.7 mg/dL (ref 2.5–4.6)

## 2020-04-15 MED ORDER — LORAZEPAM 1 MG PO TABS
1.0000 mg | ORAL_TABLET | Freq: Three times a day (TID) | ORAL | Status: DC | PRN
  Administered 2020-04-15 – 2020-04-19 (×9): 1 mg via ORAL
  Filled 2020-04-15 (×9): qty 1

## 2020-04-15 MED ORDER — LORAZEPAM 2 MG/ML IJ SOLN
1.0000 mg | Freq: Three times a day (TID) | INTRAMUSCULAR | Status: DC | PRN
  Administered 2020-04-20: 1 mg via INTRAMUSCULAR
  Filled 2020-04-15 (×2): qty 1

## 2020-04-15 MED ORDER — QUETIAPINE FUMARATE 25 MG PO TABS
25.0000 mg | ORAL_TABLET | Freq: Two times a day (BID) | ORAL | Status: DC
  Administered 2020-04-15 – 2020-04-20 (×10): 25 mg via ORAL
  Filled 2020-04-15 (×10): qty 1

## 2020-04-15 NOTE — Progress Notes (Signed)
PROGRESS NOTE    Kenneth Sullivan  SWN:[id number] DOB: 23-Jun-1956 DOA: 03/07/2020 PCP: Medicine, [name]   Chief Complaint  Patient presents with  . Cardiac Arrest    Brief Narrative: 64 year old with unknown medical history, not seen a physician for several years. He presented to the ED on 3/23 after an acute onset shortness of breath with oxygen saturation on 40% by EMS. On site, he had witnessed bradycardic event leading to asystole/cardiac arrest. ROSC achieved in 20 minutes with 3 rounds of epinephrine.  Brought to ED intubated. Admitted to ICU. CTPA showed significantly massive PE/DVT and RV strain.  He underwent IR guided mechanical thrombectomy and IVC filter. Postprocedure, while on heparin drip, patient started having maroon-colored stool and bloody drainage from OGT. GI did EGD which showed oozing gastric ulcer with clot. IR did GDA embolization  Patient remained encephalopathic. 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   4/5, underwent tracheostomy.Patient is waiting for a PEG tube placement but PEG tube is in [employer], television/film/video.He is currently on tube feed via cortrak.  He is also on dysphagia 2 diet but appetite is low. Remains on continuous tube feed at 75 cc an hour.  Assessment & Plan:   Principal Problem:   Cardiac arrest [facility]) Active Problems:   Encounter for central line placement   GI bleed   Acute respiratory failure (HCC)   Anoxic encephalopathy (HCC)   Cerebral embolism with cerebral infarction   Pulmonary emboli ([place])   Palliative care by specialist   Goals of care, counseling/discussion   DNR (do not resuscitate)   Status post tracheostomy ([place])   Massive pulmonary embolism leading  to sudden cardiac arrest leading to anoxic brain injury -s/p IR guided mechanical thrombectomy, IVC filter.  -Not on anticoagulation because of associated GI bleed and brain bleed.  Acute respiratory failure with hypoxia due to the above -Decannulated on 4/28. PCCM signed off.  Recommended referral to ENT if no stoma closure in 4 weeks.  Acute blood loss anemia due to acute GI bleed: patient started to have melena and fresh blood in OGT post thrombectomy and heparinization. EGD showed acute GI bleed secondary to large gastric ulcer. IR did GDA embolization. Continue PPI.  Check CBC in a.m.  Acute encephalopathy due to anoxic brain injury-oriented to self but confused.  Visual hallucination and agitation at times.  Follows some commands. -Low-dose Seroquel at night and as needed Haldol -Continue mittens and lap belt -Frequent reorientation and delirium precautions.  Dysphagia -Upgraded to dysphagia 2 diet by SLP but continues to have poor appetite. -On 24-hour TF via cortrak.  PEG tube on national shortage.  -Continue calorie count  Leukocytosis: Likely demargination from steroid.    Decrease prednisone to 5 mg starting 04/15/2020.  Debility/physical deconditioning -PT/OT-recommended SNF.  DVT prophylaxis: SCD Code Status: DNR/DNI Family Communication: none Status is: Inpatient  Remains inpatient appropriate because: Patient is on continuous tube feed via cortrak due to poor/inadequate oral intake and has not had PEG tube due to national shortage.  Dispo: The patient is from: Home  Anticipated d/c is to: SNF  Anticipated d/c date is: > 3 days  Patient currently medically stable but needs reliable means of feeding/PEG tube to be discharged safely unless oral intake improves.  Consultants:  Cardiology, GI, interventional radiology, neurolgoy-all signed off.  Antimicrobials:  Anti-infectives (From admission, onward)   Start      Dose/Rate Route Frequency Ordered Stop   03/26/20 1100  doxycycline (VIBRAMYCIN) 100 mg in sodium chloride 0.9 % 250 mL IVPB  Status:  Discontinued     100 mg 125 mL/hr over 120 Minutes Intravenous 2 times daily 03/26/20 1002 04/01/20 1035   03/25/20 1100  doxycycline (VIBRA-TABS) tablet 100 mg  Status:  Discontinued     100 mg Per Tube Every 12 hours 03/25/20 1009 03/26/20 1002   03/25/20 1015  doxycycline (VIBRAMYCIN) 100 mg in sodium chloride 0.9 % 250 mL IVPB  Status:  Discontinued    Note to Pharmacy: To be given after the tracheal aspirate is collected.   100 mg 125 mL/hr over 120 Minutes Intravenous Every 12 hours 03/25/20 1005 03/25/20 1008     Subjective: Asking for coffee Talk to the nurse I was informed that he was given breakfast and coffee which he threw it across the room.  Objective: Vitals:   04/14/20 2018 04/15/20 0009 04/15/20 0412 04/15/20 0722  BP: 124/82 (!) 122/96 137/84 130/85  Pulse: (!) 109 (!) 109 (!) 117 (!) 115  Resp:  18 18 19   Temp:  98.4 F (36.9 C) 98.5 F (36.9 C) 98.7 F (37.1 C)  TempSrc:  Oral Oral   SpO2:  95% 99% 99%  Weight:      Height:        Intake/Output Summary (Last 24 hours) at 04/15/2020 0913 Last data filed at 04/14/2020 1350 Gross per 24 hour  Intake 120 ml  Output --  Net 120 ml   Filed Weights   03/23/20 0342 03/25/20 0500 03/27/20 0500  Weight: 77.8 kg 77.7 kg 70.3 kg    Examination:  General exam: Appears yelling for coffee.  Core track tube in place. Respiratory system: Clear to auscultation. Respiratory effort normal. Cardiovascular system: S1 & S2 heard, RRR. No JVD, murmurs, rubs, gallops or clicks. No pedal edema. Gastrointestinal system: Abdomen is nondistended, soft and nontender. No organomegaly or masses felt. Normal bowel sounds heard. Central nervous system: Awake moves all extremities extremities: Symmetric 5 x 5 power. Skin: No rashes, lesions or ulcers Psychiatry: Awake, upset with asking questions  repeatedly asking for coffee    Data Reviewed: I have personally reviewed following labs and imaging studies  CBC: Recent Labs  Lab 04/09/20 1126 04/12/20 0603  WBC 13.4* 10.9*  NEUTROABS 12.1* 8.5*  HGB 12.2* 12.0*  HCT 38.5* 36.8*  MCV 94.8 93.4  PLT 246 371    Basic Metabolic Panel: Recent Labs  Lab 04/09/20 1126 04/09/20 1612 04/12/20 0603 04/12/20 0603 04/12/20 2123 04/13/20 1658 04/14/20 0403 04/14/20 1703 04/15/20 0555  NA 135  --  139  --   --   --  138  --  139  K 4.8  --  4.4  --   --   --  4.1  --  4.1  CL 102  --  102  --   --   --  98  --  101  CO2 22  --  26  --   --   --  27  --  27  GLUCOSE 129*  --  96  --   --   --  97  --  99  BUN 24*  --  28*  --   --   --  20  --  21  CREATININE 0.76  --  0.85  --   --   --  0.79  --  0.79  CALCIUM 8.7*  --  9.2  --   --   --  9.0  --  9.6  MG 2.2  --  2.2  --   --   --  2.1  --  2.2  PHOS 3.1   < > 4.4   < > 3.8 3.6 4.2 3.9 4.1   < > = values in this interval not displayed.    GFR: Estimated Creatinine Clearance: 95.2 mL/min (by C-G formula based on SCr of 0.79 mg/dL).  Liver Function Tests: Recent Labs  Lab 04/14/20 0403 04/15/20 0555  ALBUMIN 2.6* 3.3*    CBG: Recent Labs  Lab 04/14/20 1200 04/14/20 1557 04/14/20 1941 04/15/20 0007 04/15/20 0721  GLUCAP 106* 96 94 84 94     Recent Results (from the past 240 hour(s))  Culture, Urine     Status: Abnormal   Collection Time: 04/11/20  1:53 AM   Specimen: Urine, Catheterized  Result Value Ref Range Status   Specimen Description   Final    URINE, CATHETERIZED Performed at [facility], [place] [address], [place], [place] [address]    Special Requests NONE  Final   Culture >=100,000 COLONIES/mL PROTEUS MIRABILIS (A)  Final   Report Status 04/13/2020 FINAL  Final   Organism ID, Bacteria PROTEUS MIRABILIS (A)  Final      Susceptibility   Proteus mirabilis - MIC*    AMPICILLIN <=2 SENSITIVE Sensitive     CEFAZOLIN <=4 SENSITIVE  Sensitive     CEFTRIAXONE <=1 SENSITIVE Sensitive     CIPROFLOXACIN <=0.25 SENSITIVE Sensitive     GENTAMICIN <=1 SENSITIVE Sensitive     IMIPENEM 1 SENSITIVE Sensitive     NITROFURANTOIN 128 RESISTANT Resistant     TRIMETH/SULFA <=20 SENSITIVE Sensitive     AMPICILLIN/SULBACTAM <=2 SENSITIVE Sensitive     PIP/TAZO <=4 SENSITIVE Sensitive     * >=100,000 COLONIES/mL PROTEUS MIRABILIS         Radiology Studies: No results found.      Scheduled Meds: . chlorhexidine gluconate (MEDLINE KIT)  15 mL Mouth Rinse BID  . Chlorhexidine Gluconate Cloth  6 each Topical Q0600  . feeding supplement (ENSURE ENLIVE)  237 mL Oral TID BM  . feeding supplement (JEVITY 1.2 CAL)  900 mL Per Tube Q24H  . feeding supplement (PRO-STAT SUGAR FREE 64)  30 mL Per Tube Daily  . free water  200 mL Per Tube Q6H  . glycopyrrolate  1 mg Oral TID  . metoprolol tartrate  12.5 mg Oral BID  . pantoprazole  40 mg Oral BID  . predniSONE  5 mg Oral Q breakfast  . QUEtiapine  12.5 mg Oral QHS  . scopolamine  1 patch Transdermal Q72H  . sodium chloride flush  10-40 mL Intracatheter Q12H   Continuous Infusions: . sodium chloride       LOS: 82 days     Georgette Shell, MD [facility]   To contact the attending provider between 7A-7P or the covering provider during after hours 7P-7A, please log into the web site [email or web address] and access using universal [place] password for that web site. If you do not have the password, please call the hospital operator.  04/15/2020, 9:13 AM

## 2020-04-16 LAB — RENAL FUNCTION PANEL
Albumin: 2.7 g/dL — ABNORMAL LOW (ref 3.5–5.0)
Anion gap: 9 (ref 5–15)
BUN: 21 mg/dL (ref 8–23)
CO2: 25 mmol/L (ref 22–32)
Calcium: 9 mg/dL (ref 8.9–10.3)
Chloride: 105 mmol/L (ref 98–111)
Creatinine, Ser: 0.81 mg/dL (ref 0.61–1.24)
GFR calc Af Amer: >60 mL/min (ref >60)
GFR calc non Af Amer: >60 mL/min (ref >60)
Glucose, Bld: 77 mg/dL (ref 70–99)
Phosphorus: 4.2 mg/dL (ref 2.5–4.6)
Potassium: 4.1 mmol/L (ref 3.5–5.1)
Sodium: 139 mmol/L (ref 135–145)

## 2020-04-16 LAB — CBC
HCT: 33.5 % — ABNORMAL LOW (ref 39.0–52.0)
Hemoglobin: 11 g/dL — ABNORMAL LOW (ref 13.0–17.0)
MCH: 30.7 pg (ref 26.0–34.0)
MCHC: 32.8 g/dL (ref 30.0–36.0)
MCV: 93.6 fL (ref 80.0–100.0)
Platelets: 187 10*3/uL (ref 150–400)
RBC: 3.58 MIL/uL — ABNORMAL LOW (ref 4.22–5.81)
RDW: 18.1 % — ABNORMAL HIGH (ref 11.5–15.5)
WBC: 7.4 10*3/uL (ref 4.0–10.5)
nRBC: 0 % (ref 0.0–0.2)

## 2020-04-16 LAB — GLUCOSE, CAPILLARY
Glucose-Capillary: 105 mg/dL — ABNORMAL HIGH (ref 70–99)
Glucose-Capillary: 76 mg/dL (ref 70–99)
Glucose-Capillary: 78 mg/dL (ref 70–99)
Glucose-Capillary: 83 mg/dL (ref 70–99)
Glucose-Capillary: 93 mg/dL (ref 70–99)
Glucose-Capillary: 96 mg/dL (ref 70–99)

## 2020-04-16 LAB — MAGNESIUM: Magnesium: 2 mg/dL (ref 1.7–2.4)

## 2020-04-16 MED ORDER — HALOPERIDOL LACTATE 5 MG/ML IJ SOLN
5.0000 mg | Freq: Once | INTRAMUSCULAR | Status: AC
  Administered 2020-04-16: 5 mg via INTRAMUSCULAR
  Filled 2020-04-16: qty 1

## 2020-04-16 MED ORDER — LORAZEPAM 2 MG/ML IJ SOLN
1.0000 mg | Freq: Once | INTRAMUSCULAR | Status: AC | PRN
  Administered 2020-04-16: 1 mg via INTRAMUSCULAR

## 2020-04-16 NOTE — Progress Notes (Signed)
Around 0330 Patient bed alarmed. Charge nurse and primary nurse went into check on patient. Patient was sitting on side of bed while in lap restraint asking for a beer. Nurses tried to redirect patient that he was in hospital and we didn't have any beer. Patient also needed to be cleaned up and have clean sheets. While trying to get patient to lay back down in bed patient continued to ask for beer and became more agitated and threaten to punch the nurses.Patient continued to try and stand while unsteady and continued to threaten nursing staff. Security was called to help redirect patient. Primary nurse also paged night coverage for medication to help calm agitation. Security was able to help redirect and allow for nursing to change sheets and get patient back into lap restraint. Primary nurse and charge nurse gave PRN medication around 0430. Patient is currently in bed, with bed alarm, and lap restraint on.

## 2020-04-16 NOTE — Progress Notes (Signed)
PROGRESS NOTE    Kenneth Sullivan  IHK:[id number] DOB: Mar 10, 1956 DOA: 03/07/2020 PCP: Medicine, [name]   Chief Complaint  Patient presents with  . Cardiac Arrest    Brief Narrative:64 year old with unknown medical history, not seen a physician for several years. He presented to the ED on 3/23 after an acute onset shortness of breath with oxygen saturation on 40% by EMS. On site, he had witnessed bradycardic event leading to asystole/cardiac arrest. ROSC achieved in 20 minutes with 3 rounds of epinephrine.  Brought to ED intubated. Admitted to ICU. CTPA showed significantly massive PE/DVT and RV strain.  He underwent IR guided mechanical thrombectomy and IVC filter. Postprocedure, while on heparin drip, patient started having maroon-colored stool and bloody drainage from OGT. GI did EGD which showed oozing gastric ulcer with clot. IR did GDA embolization  Patient remained encephalopathic. 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   4/5, underwent tracheostomy.Patient is waiting for a PEG tube placement but PEG tube is in [employer], television/film/video.He is also ondysphagia 2 diet but appetite is low. Remains on continuous tube feed at 75 cc an hour.   Assessment & Plan:   Principal Problem:   Cardiac arrest [facility]) Active Problems:   Encounter for central line placement   GI bleed   Acute respiratory failure (HCC)   Anoxic encephalopathy (HCC)   Cerebral embolism with cerebral infarction   Pulmonary emboli ([place])   Palliative care by specialist   Goals of care, counseling/discussion   DNR (do not resuscitate)   Status post tracheostomy ([place])   Massive pulmonary embolism leading to sudden cardiac arrest leading to anoxic  brain injury -s/p IR guided mechanical thrombectomy, IVC filter.  -Not on anticoagulation because of associated GI bleed and brain bleed.  Acute respiratory failure with hypoxia due to the above -Decannulated on 4/28. PCCM signed off. Recommended referral to ENT if no stoma closure in 4 weeks.  Acute blood loss anemia due to acute GI bleed:patient started to have melena and fresh blood in OGT post thrombectomy and heparinization. EGD showed acute GI bleed secondary to large gastric ulcer. IR did GDA embolization. Continue PPI.  H&H are 11 and 33 today Stopped daily labs will order as needed if needed.   Acute encephalopathy due to anoxic brain injury-oriented to selfbut confused. Visual hallucination and agitation at times. More agitated yesterday, increased his Seroquel to 25 twice daily, continue Haldol, Ativan added to every 8.   Dysphagia -Upgraded to dysphagia 2 diet by SLP but continues to have poor appetite. -On 24-hour TF via cortrak. PEG tube on national shortage.  -Continue calorie count  Leukocytosis: Likely demargination from steroid.   Decrease prednisone to 5 mg starting 04/15/2020.  Debility/physical deconditioning -PT/OT-recommended SNF.  History of hypertension-his blood pressure is too soft.  DC beta-blocker.  DVT prophylaxis:SCD Code Status:DNR/DNI Family Communication:none Status is: Inpatient  Remains inpatient appropriate because: Patient is on continuous tube feed via cortrak due to poor/inadequate oral intake and has nothadPEG tube due to national shortage.  Dispo: The patient is from: Home Anticipated d/c is to: SNF Anticipated d/c date is: > 3 days Patient currentlymedically stable but needs reliable means of feeding/PEG tube to be discharged safely unless oral intake improves.  Consultants: Cardiology, GI, interventional radiology, neurolgoy-all signed off.   Antimicrobials:  Anti-infectives (From admission, onward)   Start     Dose/Rate Route Frequency Ordered Stop   03/26/20 1100  doxycycline (VIBRAMYCIN) 100 mg in sodium chloride 0.9 % 250 mL IVPB  Status:  Discontinued     100 mg 125 mL/hr over 120 Minutes Intravenous 2 times daily 03/26/20 1002 04/01/20 1035   03/25/20 1100  doxycycline (VIBRA-TABS) tablet 100 mg  Status:  Discontinued     100 mg Per Tube Every 12 hours 03/25/20 1009 03/26/20 1002   03/25/20 1015  doxycycline (VIBRAMYCIN) 100 mg in sodium chloride 0.9 % 250 mL IVPB  Status:  Discontinued    Note to Pharmacy: To be given after the tracheal aspirate is collected.   100 mg 125 mL/hr over 120 Minutes Intravenous Every 12 hours 03/25/20 1005 03/25/20 1008        Subjective: Talking to himself Less agitated this morning Does not allow me to examine him  Objective: Vitals:   04/15/20 2021 04/16/20 0001 04/16/20 0400 04/16/20 0726  BP: 119/83 118/89 (!) 118/100 104/75  Pulse: (!) 105 100 88 98  Resp: (!) 22 (!) 22 20 19   Temp: 98 F (36.7 C) 98.4 F (36.9 C) 97.6 F (36.4 C) 97.6 F (36.4 C)  TempSrc: Oral Oral Oral   SpO2: 100% (!) 87% (!) 78% 96%  Weight:      Height:        Intake/Output Summary (Last 24 hours) at 04/16/2020 0932 Last data filed at 04/15/2020 2127 Gross per 24 hour  Intake 10 ml  Output --  Net 10 ml   Filed Weights   03/23/20 0342 03/25/20 0500 03/27/20 0500  Weight: 77.8 kg 77.7 kg 70.3 kg    Examination: Patient refused to be examined   Data Reviewed: I have personally reviewed following labs and imaging studies  CBC: Recent Labs  Lab 04/09/20 1126 04/12/20 0603 04/16/20 0841  WBC 13.4* 10.9* 7.4  NEUTROABS 12.1* 8.5*  --   HGB 12.2* 12.0* 11.0*  HCT 38.5* 36.8* 33.5*  MCV 94.8 93.4 93.6  PLT 246 246 101    Basic Metabolic Panel: Recent Labs  Lab 04/09/20 1126 04/09/20 1612 04/12/20 0603 04/12/20 2123 04/14/20 0403 04/14/20 1703 04/15/20 0555 04/15/20 1650 04/16/20 0841   NA 135  --  139  --  138  --  139  --  139  K 4.8  --  4.4  --  4.1  --  4.1  --  4.1  CL 102  --  102  --  98  --  101  --  105  CO2 22  --  26  --  27  --  27  --  25  GLUCOSE 129*  --  96  --  97  --  99  --  77  BUN 24*  --  28*  --  20  --  21  --  21  CREATININE 0.76  --  0.85  --  0.79  --  0.79  --  0.81  CALCIUM 8.7*  --  9.2  --  9.0  --  9.6  --  9.0  MG 2.2  --  2.2  --  2.1  --  2.2  --  2.0  PHOS 3.1   < > 4.4   < > 4.2 3.9 4.1 3.7 4.2   < > = values in this interval not displayed.    GFR: Estimated Creatinine Clearance: 94 mL/min (by C-G formula based on SCr  of 0.81 mg/dL).  Liver Function Tests: Recent Labs  Lab 04/14/20 0403 04/15/20 0555 04/16/20 0841  ALBUMIN 2.6* 3.3* 2.7*    CBG: Recent Labs  Lab 04/15/20 1627 04/15/20 2026 04/16/20 0013 04/16/20 0358 04/16/20 0726  GLUCAP 84 93 96 93 78     Recent Results (from the past 240 hour(s))  Culture, Urine     Status: Abnormal   Collection Time: 04/11/20  1:53 AM   Specimen: Urine, Catheterized  Result Value Ref Range Status   Specimen Description   Final    URINE, CATHETERIZED Performed at [facility], [place] [address] [place], [place] [address]    Special Requests NONE  Final   Culture >=100,000 COLONIES/mL PROTEUS MIRABILIS (A)  Final   Report Status 04/13/2020 FINAL  Final   Organism ID, Bacteria PROTEUS MIRABILIS (A)  Final      Susceptibility   Proteus mirabilis - MIC*    AMPICILLIN <=2 SENSITIVE Sensitive     CEFAZOLIN <=4 SENSITIVE Sensitive     CEFTRIAXONE <=1 SENSITIVE Sensitive     CIPROFLOXACIN <=0.25 SENSITIVE Sensitive     GENTAMICIN <=1 SENSITIVE Sensitive     IMIPENEM 1 SENSITIVE Sensitive     NITROFURANTOIN 128 RESISTANT Resistant     TRIMETH/SULFA <=20 SENSITIVE Sensitive     AMPICILLIN/SULBACTAM <=2 SENSITIVE Sensitive     PIP/TAZO <=4 SENSITIVE Sensitive     * >=100,000 COLONIES/mL PROTEUS MIRABILIS         Radiology Studies: No results  found.      Scheduled Meds: . chlorhexidine gluconate (MEDLINE KIT)  15 mL Mouth Rinse BID  . Chlorhexidine Gluconate Cloth  6 each Topical Q0600  . feeding supplement (ENSURE ENLIVE)  237 mL Oral TID BM  . feeding supplement (JEVITY 1.2 CAL)  900 mL Per Tube Q24H  . feeding supplement (PRO-STAT SUGAR FREE 64)  30 mL Per Tube Daily  . free water  200 mL Per Tube Q6H  . glycopyrrolate  1 mg Oral TID  . metoprolol tartrate  12.5 mg Oral BID  . pantoprazole  40 mg Oral BID  . predniSONE  5 mg Oral Q breakfast  . QUEtiapine  25 mg Oral BID  . sodium chloride flush  10-40 mL Intracatheter Q12H   Continuous Infusions: . sodium chloride       LOS: 40 days     Georgette Shell, MD [facility]   To contact the attending provider between 7A-7P or the covering provider during after hours 7P-7A, please log into the web site [email or web address] and access using universal [place] password for that web site. If you do not have the password, please call the hospital operator.  04/16/2020, 9:32 AM

## 2020-04-16 NOTE — Progress Notes (Signed)
Pt resting in bed and continues to talk to people who are not there and asking nurse to hand him the can of beer that is sitting right there on the table, noted pt much more quiet  and less agitated, will cont to monitor.

## 2020-04-17 LAB — GLUCOSE, CAPILLARY
Glucose-Capillary: 124 mg/dL — ABNORMAL HIGH (ref 70–99)
Glucose-Capillary: 127 mg/dL — ABNORMAL HIGH (ref 70–99)
Glucose-Capillary: 130 mg/dL — ABNORMAL HIGH (ref 70–99)
Glucose-Capillary: 78 mg/dL (ref 70–99)
Glucose-Capillary: 80 mg/dL (ref 70–99)
Glucose-Capillary: 89 mg/dL (ref 70–99)
Glucose-Capillary: 96 mg/dL (ref 70–99)

## 2020-04-17 MED ORDER — OXYCODONE HCL 5 MG PO TABS
5.0000 mg | ORAL_TABLET | Freq: Four times a day (QID) | ORAL | Status: DC | PRN
  Administered 2020-04-20 – 2020-05-29 (×50): 5 mg via ORAL
  Filled 2020-04-17 (×51): qty 1

## 2020-04-17 NOTE — Progress Notes (Signed)
PROGRESS NOTE  Kenneth Sullivan SWF:[id number] DOB: 23-Jun-1956   PCP: Medicine, [name]  Patient is from: home  DOA: 03/07/2020 LOS: 55  Brief Narrative / Interim history: 64 year old with unknown medical history, not seen a physician for several years.  He presented to the ED on 3/23 after an acute onset shortness of breath with oxygen saturation on 40% by EMS. On site, he had witnessed bradycardic event leading to asystole/cardiac arrest.  ROSC achieved in 20 minutes with 3 rounds of epinephrine.  Brought to ED intubated.  Admitted to ICU. CTPA showed significantly massive PE/DVT and RV strain.  He underwent IR guided mechanical thrombectomy and IVC filter. Postprocedure, while on heparin drip, patient started having maroon-colored stool and bloody drainage from OGT.  GI did EGD which showed oozing gastric ulcer with clot. IR did GDA embolization  Patient remained encephalopathic. 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   4/5, underwent tracheostomy.  Patient is waiting for a PEG tube placement but PEG tube is in Producer, television/film/video. He is currently on tube feed via cortrak.  He is also on dysphagia 2 diet but appetite is low. Was on continuous tube feed at 75 cc an hour but pulled out his cortrak.   Subjective: Seen and examined earlier this morning.  Patient pulled his cortrak.  No other major events overnight of this morning.  Asking for food.  He is not a reliable historian.  He is oriented to self and partial place.   Objective: Vitals:   04/16/20 1941 04/17/20 0015 04/17/20 0339 04/17/20 0755  BP: 104/81 91/66 101/72 100/81  Pulse: (!) 105 (!) 104 (!) 124 (!) 107  Resp:    (!) 22  Temp: 99.1 F (37.3 C) 97.6 F (36.4  C) 97.6 F (36.4 C) 97.8 F (36.6 C)  TempSrc: Oral  Oral Axillary  SpO2: 95%  98% 97%  Weight:      Height:       No intake or output data in the 24 hours ending 04/17/20 1040 Filed Weights   03/23/20 0342 03/25/20 0500 03/27/20 0500  Weight: 77.8 kg 77.7 kg 70.3 kg    Examination:   GENERAL: No apparent distress.  Nontoxic. HEENT: MMM.  Vision and hearing grossly intact.  NECK: Supple.  No apparent JVD.  RESP: On room air.  No IWOB.  Fair aeration bilaterally. CVS:  RRR. Heart sounds normal.  ABD/GI/GU: Bowel sounds present. Soft. Non tender.  MSK/EXT:  Moves extremities. No apparent deformity. No edema.  SKIN: no apparent skin lesion or wound NEURO: Awake, alert and oriented only to self.  He thinks he is at [facility].  No apparent focal neuro deficit. PSYCH: Calm.  No apparent distress.  Procedures:  ETT 3/23 >>4/5 Trach 4/5 >>changed to 6 cuffless 4/22 >> L TLC 3/23 >> out ALine L Fem 3/23 >> 3/30  Microbiology summarized: Bcx 3/23 >> negative [name] 3/23 >> negative BCx2 3/23 >> negative   Assessment & Plan: Massive pulmonary embolism leading to sudden cardiac arrest leading to anoxic brain injury -s/p IR guided mechanical thrombectomy, IVC filter.  -Not on anticoagulation because of associated GI bleed and brain bleed.  Acute respiratory failure with hypoxia due to the above -Decannulated on 4/28. PCCM signed off.  Recommended referral to ENT if no stoma closure  in 4 weeks.  Acute blood loss anemia due to acute GI bleed: patient started to have melena and fresh blood in OGT post thrombectomy and heparinization. EGD showed acute GI bleed secondary to large gastric ulcer. IR did GDA embolization. Continue PPI.  H&H stable. -Monitor H&H as needed -Check anemia panel  Acute encephalopathy due to anoxic brain injury-awake and alert but only oriented to self.  Visual hallucination and agitation at times.  Follows some commands. -On Seroquel 25 mg  twice daily and as needed Haldol -Add low-dose Klonopin. -Continue mittens and lap belt -Frequent reorientation and delirium precautions.  Dysphagia -Upgraded to dysphagia 2 diet by SLP but continues to have poor appetite. -Was on 24-hr TF via cortrak but pulled out his cortrak. Will replace cortrak.  PEG tube on national shortage.  -Continue calorie count  Leukocytosis: Likely demargination from steroid.  Unclear indication. -Weaned off steroid.  Debility/physical deconditioning -PT/OT-recommended SNF.    Nutrition Problem: Inadequate oral intake Etiology: lethargy/confusion  Signs/Symptoms: meal completion < 25%  Interventions: Ensure Enlive (each supplement provides 350kcal and 20 grams of protein), Prostat, Tube feeding       DVT prophylaxis: SCD Code Status: DNR/DNI Family Communication: None at bedside.  Status is: Inpatient  Remains inpatient appropriate because: Patient is on continuous tube feed via cortrak due to poor/inadequate oral intake and has not had PEG tube due to national shortage.  Dispo: The patient is from: Home              Anticipated d/c is to: SNF              Anticipated d/c date is: > 3 days              Patient currently medically stable but needs reliable means of feeding/PEG tube to be discharged safely unless oral intake improves.  Consultants:  Cardiology, GI, interventional radiology, neurolgoy-all signed off.   Sch Meds:  Scheduled Meds: . chlorhexidine gluconate (MEDLINE KIT)  15 mL Mouth Rinse BID  . Chlorhexidine Gluconate Cloth  6 each Topical Q0600  . feeding supplement (ENSURE ENLIVE)  237 mL Oral TID BM  . feeding supplement (JEVITY 1.2 CAL)  900 mL Per Tube Q24H  . feeding supplement (PRO-STAT SUGAR FREE 64)  30 mL Per Tube Daily  . free water  200 mL Per Tube Q6H  . glycopyrrolate  1 mg Oral TID  . pantoprazole  40 mg Oral BID  . predniSONE  5 mg Oral Q breakfast  . QUEtiapine  25 mg Oral BID  . sodium chloride  flush  10-40 mL Intracatheter Q12H   Continuous Infusions: . sodium chloride     PRN Meds:.sodium chloride, acetaminophen, docusate sodium, gelatin adsorbable, guaiFENesin-dextromethorphan, haloperidol lactate, hydrALAZINE, ipratropium-albuterol, LORazepam **OR** LORazepam, oxyCODONE, sodium chloride flush  Antimicrobials: Anti-infectives (From admission, onward)   Start     Dose/Rate Route Frequency Ordered Stop   03/26/20 1100  doxycycline (VIBRAMYCIN) 100 mg in sodium chloride 0.9 % 250 mL IVPB  Status:  Discontinued     100 mg 125 mL/hr over 120 Minutes Intravenous 2 times daily 03/26/20 1002 04/01/20 1035   03/25/20 1100  doxycycline (VIBRA-TABS) tablet 100 mg  Status:  Discontinued     100 mg Per Tube Every 12 hours 03/25/20 1009 03/26/20 1002   03/25/20 1015  doxycycline (VIBRAMYCIN) 100 mg in sodium chloride 0.9 % 250 mL IVPB  Status:  Discontinued    Note to Pharmacy: To be given after the tracheal aspirate  is collected.   100 mg 125 mL/hr over 120 Minutes Intravenous Every 12 hours 03/25/20 1005 03/25/20 1008       I have personally reviewed the following labs and images: CBC: Recent Labs  Lab 04/12/20 0603 04/16/20 0841  WBC 10.9* 7.4  NEUTROABS 8.5*  --   HGB 12.0* 11.0*  HCT 36.8* 33.5*  MCV 93.4 93.6  PLT 246 187   BMP &GFR Recent Labs  Lab 04/12/20 0603 04/12/20 2123 04/14/20 0403 04/14/20 1703 04/15/20 0555 04/15/20 1650 04/16/20 0841  NA 139  --  138  --  139  --  139  K 4.4  --  4.1  --  4.1  --  4.1  CL 102  --  98  --  101  --  105  CO2 26  --  27  --  27  --  25  GLUCOSE 96  --  97  --  99  --  77  BUN 28*  --  20  --  21  --  21  CREATININE 0.85  --  0.79  --  0.79  --  0.81  CALCIUM 9.2  --  9.0  --  9.6  --  9.0  MG 2.2  --  2.1  --  2.2  --  2.0  PHOS 4.4   < > 4.2 3.9 4.1 3.7 4.2   < > = values in this interval not displayed.   Estimated Creatinine Clearance: 94 mL/min (by C-G formula based on SCr of 0.81 mg/dL). Liver &  Pancreas: Recent Labs  Lab 04/14/20 0403 04/15/20 0555 04/16/20 0841  ALBUMIN 2.6* 3.3* 2.7*   No results for input(s): LIPASE, AMYLASE in the last 168 hours. No results for input(s): AMMONIA in the last 168 hours. Diabetic: No results for input(s): HGBA1C in the last 72 hours. Recent Labs  Lab 04/16/20 1622 04/16/20 1938 04/17/20 0012 04/17/20 0352 04/17/20 0825  GLUCAP 83 76 80 78 89   Cardiac Enzymes: No results for input(s): CKTOTAL, CKMB, CKMBINDEX, TROPONINI in the last 168 hours. No results for input(s): PROBNP in the last 8760 hours. Coagulation Profile: No results for input(s): INR, PROTIME in the last 168 hours. Thyroid Function Tests: No results for input(s): TSH, T4TOTAL, FREET4, T3FREE, THYROIDAB in the last 72 hours. Lipid Profile: No results for input(s): CHOL, HDL, LDLCALC, TRIG, CHOLHDL, LDLDIRECT in the last 72 hours. Anemia Panel: No results for input(s): VITAMINB12, FOLATE, FERRITIN, TIBC, IRON, RETICCTPCT in the last 72 hours. Urine analysis:    Component Value Date/Time   COLORURINE AMBER (A) 04/11/2020 0153   APPEARANCEUR CLOUDY (A) 04/11/2020 0153   LABSPEC 1.024 04/11/2020 0153   PHURINE 7.0 04/11/2020 0153   GLUCOSEU NEGATIVE 04/11/2020 0153   HGBUR SMALL (A) 04/11/2020 0153   BILIRUBINUR NEGATIVE 04/11/2020 0153   KETONESUR NEGATIVE 04/11/2020 0153   PROTEINUR 100 (A) 04/11/2020 0153   NITRITE NEGATIVE 04/11/2020 0153   LEUKOCYTESUR LARGE (A) 04/11/2020 0153   Sepsis Labs: Invalid input(s): PROCALCITONIN, [place]  Microbiology: Recent Results (from the past 240 hour(s))  Culture, Urine     Status: Abnormal   Collection Time: 04/11/20  1:53 AM   Specimen: Urine, Catheterized  Result Value Ref Range Status   Specimen Description   Final    URINE, CATHETERIZED Performed at [facility], [address], [place], [place] [address]    Special Requests NONE  Final   Culture >=100,000 COLONIES/mL PROTEUS MIRABILIS (A)  Final  Report Status 04/13/2020 FINAL  Final   Organism ID, Bacteria PROTEUS MIRABILIS (A)  Final      Susceptibility   Proteus mirabilis - MIC*    AMPICILLIN <=2 SENSITIVE Sensitive     CEFAZOLIN <=4 SENSITIVE Sensitive     CEFTRIAXONE <=1 SENSITIVE Sensitive     CIPROFLOXACIN <=0.25 SENSITIVE Sensitive     GENTAMICIN <=1 SENSITIVE Sensitive     IMIPENEM 1 SENSITIVE Sensitive     NITROFURANTOIN 128 RESISTANT Resistant     TRIMETH/SULFA <=20 SENSITIVE Sensitive     AMPICILLIN/SULBACTAM <=2 SENSITIVE Sensitive     PIP/TAZO <=4 SENSITIVE Sensitive     * >=100,000 COLONIES/mL PROTEUS MIRABILIS    Radiology Studies: No results found.    T. [place]  If 7PM-7AM, please contact night-coverage [email or web address] Password [email or web address] 04/17/2020, 10:40 AM

## 2020-04-17 NOTE — Progress Notes (Signed)
  Speech Language Pathology Treatment: Dysphagia  Patient Details Name: Kenneth Sullivan MRN: [id number] DOB: 02-Nov-1956 Today's Date: 04/17/2020 Time: [id number] SLP Time Calculation (min) (ACUTE ONLY): 15 min  Assessment / Plan / Recommendation Clinical Impression  Pt's attention to this therapist, left field of vision and expressive language notably improved from previous sessions. He was calm and did not have frustration outburts. He is having possible hallucinations with reports of guns and dogs in his room. Held cup in left hand consuming multiple sips water without s/s aspiration. Repeated trials successful to receive cracker in right hand to self feed. Mastication rate was somewhat excessive and question if incomplete although oral cavity clear upon inspection. SLP will upgrade texture to Dys 3 (meats chopped), continue thin liquids, pills whole in puree. Pt pulled Cortak and pt has calorie count on door.      HPI HPI: 64 year old admitted 3/23 with shortness of breath with sats in the 40s on with witnessed bradycardic, asystolic arrest when EMS was present with 3 rounds of epi, 20 minutes to ROSC per chart. Found to also have bilateral PR's and had GI bleed following arrest. MRI showed multifocal acute-subactue ischemic infarcts c/w global hypoperfusion. Intubated 3/23 adn trach placed 4/2. CXR 4/3 no active disease. No evidence of pneumonia or pulmonary edema. Per chart pt smokes 1 pack/day, consumes 2-3 forty oz beers daily, + marijuana.      SLP Plan  Continue with current plan of care       Recommendations  Diet recommendations: Dysphagia 3 (mechanical soft);Thin liquid Liquids provided via: Straw;Cup Medication Administration: Whole meds with puree Supervision: Staff to assist with self feeding;Full supervision/cueing for compensatory strategies Compensations: Minimize environmental distractions;Slow rate;Small sips/bites Postural Changes and/or Swallow Maneuvers: Seated upright 90  degrees                Oral Care Recommendations: Oral care BID Follow up Recommendations: Skilled Nursing facility SLP Visit Diagnosis: Dysphagia, unspecified (R13.10) Plan: Continue with current plan of care                       Houston Siren 04/17/2020, 2:28 PM  [name] Colvin Caroli.Ed Risk analyst [phone] Office [phone]

## 2020-04-17 NOTE — Progress Notes (Addendum)
Brief Nutrition Note  RD consulted due to patient pulling out Cortrak and need to assess whether pt receiving enough kcal and protein via PO intake alone or whether Cortrak needs to be replaced.  RD ordered 48-hour calorie count to begin today with breakfast meal. Calorie count will end 04/18/20 after dinner meal. RD will provide the results of Day 1 of calorie count tomorrow, 04/18/20.  Will d/c tube feeding orders and free water flushes given lack of enteral access.  If pt taking in adequate kcal and protein via PO intake, will not replace Cortrak. Discussed with MD.   Gaynell Face, MS, RD, LDN Inpatient Clinical Dietitian Pager: [phone] Weekend/After Hours: [phone]

## 2020-04-18 LAB — GLUCOSE, CAPILLARY
Glucose-Capillary: 105 mg/dL — ABNORMAL HIGH (ref 70–99)
Glucose-Capillary: 120 mg/dL — ABNORMAL HIGH (ref 70–99)
Glucose-Capillary: 107 mg/dL — ABNORMAL HIGH (ref 70–99)

## 2020-04-18 MED ORDER — ENSURE ENLIVE PO LIQD
237.0000 mL | Freq: Four times a day (QID) | ORAL | Status: DC
  Administered 2020-04-18 – 2020-07-03 (×169): 237 mL via ORAL

## 2020-04-18 NOTE — Progress Notes (Signed)
Nutrition Follow-up  DOCUMENTATION CODES:   Not applicable  INTERVENTION:   - Feeding assistance with meals and snacks  - Continue 48-hour calorie count, RD to provide Day 2 results tomorrow  - Ensure Enlive po QID, each supplement provides 350 kcal and 20 grams of protein  NUTRITION DIAGNOSIS:   Inadequate oral intake related to lethargy/confusion as evidenced by meal completion < 25%.  Progressing  GOAL:   Patient will meet greater than or equal to 90% of their needs  Met  MONITOR:   PO intake, Supplement acceptance, Diet advancement, Labs, Weight trends, TF tolerance  REASON FOR ASSESSMENT:   Ventilator    ASSESSMENT:   64 yo male admitted S/P cardiac arrest, S/P normothermia protocol. Found to have massive PE, DVT, GI bleed from gastric ulcer. PMH includes current smoker, heavy alcohol use.  4/02 - trach  4/09 - Cortrak placed  4/11 - pt pulled out trach, replaced by RT, TF held due to Cortrak leaking  4/13 - MBS, Dysphagia 1 with thin liquids started 4/14 - Cortrak removed 4/16 - Cortrak replaced 4/22 - Cortrak clogged, replaced by diagnostic radiology (tip in pre-pyloric region of stomach) 4/25 - diet advanced to Dysphagia 2 4/27 - tube feeds changed to nocturnal 4/28 - decannulated 4/30 - pulled Cortrak, TF stopped 5/03 - diet upgraded to Dysphagia 3  Please see Calorie Count: Day 1 note for details regarding PO intake.  No new weights available since 03/27/20.  Spoke with pt briefly at bedside. Pt asking for help due to spilling his coffee. Discussed with RN who reports pt possibly having trouble seeing and that he is not able to feed himself. Feeding assistance ordered.  RD will increase Ensure Enlive from TID to QID to aid pt in meeting kcal and protein needs.  Meal Completion: 25-100% (improving)  Medications reviewed and include: Ensure Enlive TID, protonix  Labs reviewed. CBG's: 96-130 x 24 hours  Diet Order:   Diet Order             DIET DYS 3 Room service appropriate? No; Fluid consistency: Thin  Diet effective now              EDUCATION NEEDS:   Not appropriate for education at this time  Skin:  Skin Assessment: Skin Integrity Issues: Other: puncture neck, groin  Last BM:  04/17/20 small type 4  Height:   Ht Readings from Last 1 Encounters:  03/07/20 6' 2"  (1.88 m)    Weight:   Wt Readings from Last 1 Encounters:  03/27/20 70.3 kg    Ideal Body Weight:  86.4 kg  BMI:  Body mass index is 19.9 kg/m.  Estimated Nutritional Needs:   Kcal:  2100-2300  Protein:  110-125 gm  Fluid:  >/= 2 L    Gaynell Face, MS, RD, LDN Inpatient Clinical Dietitian Pager: [phone] Weekend/After Hours: [phone]

## 2020-04-18 NOTE — Evaluation (Signed)
Occupational Therapy Evaluation Patient Details Name: Kenneth Sullivan MRN: [id number] DOB: May 31, 1956 Today's Date: 04/18/2020    History of Present Illness Pt is 64 yo male with unknown medical hx.  He presented to ED on 3/23 with acute SHOB.  He had bradycardiac event that lead to asystole/cardiac arrest (ROSC achieved in 20 mins with 3 rounds epinephrine).  Pt was intubated and admitted to ICU.  Pt with massive PE/DVT and RV strain.  He underwent IR guided mechanical thrombectomy and IVC Filter.   Pt developed gastric ulcer and required GDA embolization.  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 Pt s/p trach on 4/5 and changed to cuffless on 4/22--decannualted 4/28   Clinical Impression   This 64 yo male admitted with above presents to acute OT with left inattention, decreased vision on left, decreased balance, decreased awareness of deficits, and today pleasantly confused all of this affecting his safety and independence with basic ADLs with current level of function of min -Mod A due to deficits above. He will benefit from acute OT with follow at SNF.    Follow Up Recommendations  SNF;Supervision/Assistance - 24 hour    Equipment Recommendations  Other (comment)(TBD at next venue)       Precautions / Restrictions Precautions Precautions: Fall Restrictions Weight Bearing Restrictions: No      Mobility Bed Mobility Overal bed mobility: Needs Assistance Bed Mobility: Supine to Sit;Sit to Supine     Supine to sit: Min guard Sit to supine: Min guard      Transfers Overall transfer level: Needs assistance Equipment used: 1 person hand held assist Transfers: Sit to/from Stand Sit to Stand: Min assist              Balance Overall  balance assessment: Needs assistance Sitting-balance support: No upper extremity supported;Feet supported Sitting balance-Leahy Scale: Good     Standing balance support: Single extremity supported Standing balance-Leahy Scale: Poor                             ADL either performed or assessed with clinical judgement   ADL Overall ADL's : Needs assistance/impaired Eating/Feeding: Sitting Eating/Feeding Details (indicate cue type and reason): Min guard A sitting EOB to drink milk from carton after opened for him. Max A for ensure with straw in it--he could not get the straw re-oriented by himself everytime he took straw out of his mouth Grooming: Wash/dry face;Moderate assistance Grooming Details (indicate cue type and reason): standing at sink. Pt could not find water to turn it on, once water on he could not find water to put wash cloth under it. He did wash all his face once washcloth placed in his hand.                 Toilet Transfer: Minimal assistance;Ambulation Toilet Transfer Details (indicate cue type and reason): RUE HHA, no AD (bed>sink to groom>bed)                 Vision   Additional Comments: Pt with definite left inattention, running into things on the left, not able to find things on left--unable to follow commands for testing            Pertinent Vitals/Pain Pain Assessment: No/denies pain  Hand Dominance Right   Extremity/Trunk Assessment Upper Extremity Assessment Upper Extremity Assessment: Overall WFL for tasks assessed           Communication Communication Communication: (hard to understand at times due to decreased annuciation of words)   Cognition Arousal/Alertness: Awake/alert Behavior During Therapy: Restless Overall Cognitive Status: No family/caregiver present to determine baseline cognitive functioning Area of Impairment: Orientation;Following commands;Problem solving;Safety/judgement                  Orientation Level: Disoriented to;Place;Time;Situation     Following Commands: Follows one step commands with increased time Safety/Judgement: Decreased awareness of deficits;Decreased awareness of safety   Problem Solving: Difficulty sequencing;Requires tactile cues;Requires verbal cues                Home Living Family/patient expects to be discharged to:: Skilled nursing facility                                        Prior Functioning/Environment          Comments: Pt was unable to answer any PLOF or home environment questions.  However, likely that pt was completeley independent prior to admission.        OT Problem List: Impaired balance (sitting and/or standing);Impaired vision/perception;Decreased coordination;Decreased cognition;Decreased safety awareness      OT Treatment/Interventions: Self-care/ADL training;DME and/or AE instruction;Patient/family education;Balance training;Visual/perceptual remediation/compensation;Therapeutic activities    OT Goals(Current goals can be found in the care plan section) Acute Rehab OT Goals Patient Stated Goal: "to buy me something", "we should always look out for others not ourselves" OT Goal Formulation: Patient unable to participate in goal setting Time For Goal Achievement: 05/02/20 Potential to Achieve Goals: Fair  OT Frequency: Min 2X/week   Barriers to D/C: Decreased caregiver support             AM-PAC OT "6 Clicks" Daily Activity     Outcome Measure Help from another person eating meals?: A Lot Help from another person taking care of personal grooming?: A Lot Help from another person toileting, which includes using toliet, bedpan, or urinal?: A Lot Help from another person bathing (including washing, rinsing, drying)?: A Lot Help from another person to put on and taking off regular upper body clothing?: A Lot Help from another person to put on and taking off regular lower body clothing?: A  Lot 6 Click Score: 12   End of Session Equipment Utilized During Treatment: Gait belt  Activity Tolerance: Patient tolerated treatment well Patient left: in bed;with call bell/phone within reach;with bed alarm set  OT Visit Diagnosis: Unsteadiness on feet (R26.81);Other abnormalities of gait and mobility (R26.89);Muscle weakness (generalized) (M62.81);Low vision, both eyes (H54.2);Other symptoms and signs involving cognitive function                Time: [id number] OT Time Calculation (min): 22 min Charges:  OT General Charges $OT Visit: 1 Visit OT Evaluation $OT Eval Moderate Complexity: 1 Mod  [address], OTR/L Acute [employer] Pager [phone] Office [phone]     Almon Register 04/18/2020, 12:44 PM

## 2020-04-18 NOTE — NC FL2 (Signed)
[place] LEVEL OF CARE SCREENING TOOL     IDENTIFICATION  Patient Name: Kenneth Sullivan Birthdate: [birth year] Sex: male Admission Date (Current Location): 03/07/2020  [facility] and [place] Number:  Herbalist and Address:  [facility] [place], [place] [address] [place], [place] [address]      Provider Number: [id number]  Attending Physician Name and Address:  Mercy Riding, MD  Relative Name and Phone Number:  [name]    Current Level of Care: Hospital Recommended Level of Care: [place] Prior Approval Number:    Date Approved/Denied:   PASRR Number: [id number]  Discharge Plan: SNF    Current Diagnoses: Patient Active Problem List   Diagnosis Date Noted  . Status post tracheostomy ([place])   . Palliative care by specialist   . Goals of care, counseling/discussion   . DNR (do not resuscitate)   . Pulmonary emboli ([place])   . 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 Acute respiratory failure ([place])   . Anoxic encephalopathy ([place])   . Cardiac arrest ([place]) 03/07/2020  . GI bleed 03/07/2020  . Encounter for central line placement     Orientation RESPIRATION BLADDER Height & Weight     Self  Normal Incontinent Weight: 155 lb (70.3 kg) Height:  6' 2"  (188 cm)  BEHAVIORAL SYMPTOMS/MOOD NEUROLOGICAL BOWEL NUTRITION STATUS      Incontinent Diet(see discharge summary)  AMBULATORY STATUS COMMUNICATION OF NEEDS Skin   Limited Assist Verbally Normal                       Personal Care Assistance Level of Assistance  Bathing, Dressing, Feeding Bathing Assistance: Limited assistance Feeding assistance: Limited assistance Dressing Assistance: Limited assistance     Functional Limitations Info  Sight, Speech, Hearing Sight Info: Adequate Hearing Info: Adequate Speech Info: Adequate    SPECIAL CARE FACTORS FREQUENCY  PT (By licensed PT), OT (By licensed OT)     PT Frequency: 5x a week OT  Frequency: 5x a week            Contractures Contractures Info: Not present    Additional Factors Info  Code Status, Allergies Code Status Info: DNR Allergies Info: NKA           Current Medications (04/18/2020):  This is the current hospital active medication list Current Facility-Administered Medications  Medication Dose Route Frequency Provider Last Rate Last Admin  . 0.9 %  sodium chloride infusion   Intravenous PRN Spero Geralds, MD      . acetaminophen (TYLENOL) tablet 650 mg  650 mg Oral Q6H PRN Wendee Beavers T, MD      . chlorhexidine gluconate (MEDLINE KIT) (PERIDEX) 0.12 % solution 15 mL  15 mL Mouth Rinse BID Wilford Corner, MD   15 mL at 04/18/20 1017  . docusate sodium (COLACE) capsule 100 mg  100 mg Oral BID PRN Wilford Corner, MD      . feeding supplement (ENSURE ENLIVE) (ENSURE ENLIVE) liquid 237 mL  237 mL Oral QID Wendee Beavers T, MD   237 mL at 04/18/20 1423  . gelatin adsorbable (GELFOAM/SURGIFOAM) sponge 12-7 mm    PRN Arne Cleveland, MD   1 each at 03/07/20 1952  . glycopyrrolate (ROBINUL) tablet 1 mg  1 mg Oral TID Wendee Beavers T, MD   1 mg at 04/18/20 1018  . guaiFENesin-dextromethorphan (ROBITUSSIN DM) 100-10 MG/5ML syrup 10 mL  10 mL Oral Q4H PRN  Mercy Riding, MD      . haloperidol lactate (HALDOL) injection 2 mg  2 mg Intravenous Q6H PRN Caren Griffins, MD   2 mg at 04/17/20 1722  . hydrALAZINE (APRESOLINE) injection 5 mg  5 mg Intravenous Q8H PRN Hosie Poisson, MD      . ipratropium-albuterol (DUONEB) 0.5-2.5 (3) MG/3ML nebulizer solution 3 mL  3 mL Nebulization Q6H PRN Chesley Mires, MD      . LORazepam (ATIVAN) tablet 1 mg  1 mg Oral Q8H PRN Georgette Shell, MD   1 mg at 04/18/20 1415   Or  . LORazepam (ATIVAN) injection 1 mg  1 mg Intramuscular Q8H PRN Georgette Shell, MD      . oxyCODONE (Oxy IR/ROXICODONE) immediate release tablet 5 mg  5 mg Oral Q6H PRN Gonfa, Taye T, MD      . pantoprazole (PROTONIX) EC tablet 40 mg  40 mg Oral BID  Wendee Beavers T, MD   40 mg at 04/18/20 1018  . QUEtiapine (SEROQUEL) tablet 25 mg  25 mg Oral BID Georgette Shell, MD   25 mg at 04/18/20 1019  . sodium chloride flush (NS) 0.9 % injection 10-40 mL  10-40 mL Intracatheter PRN Collene Gobble, MD   10 mL at 04/15/20 2126     Discharge Medications: Please see discharge summary for a list of discharge medications.  Relevant Imaging Results:  Relevant Lab Results:   Additional Information SSN [id number]  Field Staniszewski J [place], [place]

## 2020-04-18 NOTE — Progress Notes (Signed)
Patient is very hungry during the night.  Patient drank all of a chocolate ensure and 3 packs of graham crackers with 3 peanut butter cups.

## 2020-04-18 NOTE — Progress Notes (Signed)
Calorie Count Note: Day 1  48-hour calorie count ordered. Calorie count started yesterday (04/17/20) at breakfast meal. Calorie count will end today (04/18/20) after dinner meal. RD will provide results of Day 2 tomorrow (04/19/20).  Diet: Dysphagia 3, thin liquids Supplements: Ensure Enlive TID  Day 1 (04/17/20): Breakfast: 314 kcal, 13 grams protein Lunch: 330 kcal, 23 grams protein Snack: 50 kcal, 0 grams protein Dinner: 469 kcal, 19 grams protein Snack: 200 kcal, 4 grams protein Supplements: 1050 kcal, 60 grams protein (3 Ensure Enlive supplements)  Total 24-hour intake: 2413 kcal (>100% of minimum estimated needs)  119 protein (100% of minimum estimated needs)  Nutrition Diagnosis: Inadequate oral intake related to lethargy/confusion as evidenced by meal completion < 25%.  Progressing  Goal: Patient will meet greater than or equal to 90% of their needs  Intervention: - Continue 48-hour calorie count - Increase Ensure Enlive to QID   Gaynell Face, MS, RD, LDN Inpatient Clinical Dietitian Pager: [phone] Weekend/After Hours: [phone]

## 2020-04-18 NOTE — TOC Progression Note (Signed)
Transition of Care [facility]) - Progression Note    Patient Details  Name: Mitchael Parlee MRN: [id number] Date of Birth: Feb 10, 1956  Transition of Care [facility]) CM/SW [place], [place] Phone Number: 04/18/2020, 4:30 PM  Clinical Narrative:    CSW received consult for possible SNF placement at time of discharge. Patient not fully oriented, CSW contacted patient's niece [name] [phone] to discuss PT recommendation of SNF placement at discharge. Niece is understanding of recommendation and agreeable to SNF placement. She requested to be updated on bed offers once they are available. No further questions expressed at this time. TOC team will continue to follow.   Expected Discharge Plan: Skilled Nursing Facility Barriers to Discharge: [place] [facility])  Expected Discharge Plan and Services Expected Discharge Plan: [place] In-house Referral: Clinical Social Work     Living arrangements for the past 2 months: Apartment                                       Social Determinants of Health (SDOH) Interventions    Readmission Risk Interventions No flowsheet data found.

## 2020-04-18 NOTE — Progress Notes (Signed)
Physical Therapy Treatment Patient Details Name: Kenneth Sullivan MRN: [id number] DOB: 08/27/1956 Today's Date: 04/18/2020    History of Present Illness Pt is 64 yo male with unknown medical hx.  He presented to ED on 3/23 with acute SHOB.  He had bradycardiac event that lead to asystole/cardiac arrest (ROSC achieved in 20 mins with 3 rounds epinephrine).  Pt was intubated and admitted to ICU.  Pt with massive PE/DVT and RV strain.  He underwent IR guided mechanical thrombectomy and IVC Filter.   Pt developed gastric ulcer and required GDA embolization.  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 Pt s/p trach on 4/5 and changed to cuffless on 4/22--decannualted 4/28    PT Comments    Pt sitting EOB with RN present upon arrival of PT, agreeable to PT session with focus on gait training and endurance. The pt was able to demo good tolerance for ambulation, but remains significantly limited in safety with mobility due to significant deficits in vision, left-sided inattention, and cognition. The pt had multiple minor LOB with ambulation requiring minA to recover and also requires constant VC for redirection and to avoid hitting objects in hallway on his left side. The pt will continue to benefit from skilled PT to further progress functional stability to improve safety and independence with mobility.    Follow Up Recommendations  SNF     Equipment Recommendations  (defer to post acute)    Recommendations for Other Services       Precautions / Restrictions Precautions Precautions: Fall Restrictions Weight Bearing Restrictions: No    Mobility  Bed Mobility Overal bed mobility: Needs Assistance Bed Mobility: Supine to Sit;Sit to Supine     Supine to  sit: Supervision Sit to supine: Supervision   General bed mobility comments: supervision for safety, pt mobilizing in bed independently once staff leaves the room  Transfers Overall transfer level: Needs assistance Equipment used: 1 person hand held assist Transfers: Sit to/from Stand Sit to Stand: Min guard         General transfer comment: minG for safety as pt reaching for UE support for balance. significant instability when standing on fall pads in room, pt standing up without staff present x3 after PT left the room despite pt verbalizing he understands need to call for help before getting out of bed/chair  Ambulation/Gait Ambulation/Gait assistance: Min assist Gait Distance (Feet): 120 Feet Assistive device: 1 person hand held assist Gait Pattern/deviations: Step-through pattern;Decreased stride length;Scissoring;Staggering right;Staggering left   Gait velocity interpretation: 1.31 - 2.62 ft/sec, indicative of limited community ambulator General Gait Details: pt requiring assist of 1 to steady, ambulated 120 ft in hall to RN station, stopping at each room to look for staff to assist with aquiring soda. Pt with multiple small LOB requring minA to recover.   Stairs             Wheelchair Mobility    Modified Rankin (Stroke Patients Only)       Balance Overall balance assessment: Needs assistance Sitting-balance support: No upper extremity supported;Feet supported Sitting balance-Leahy Scale: Good Sitting balance - Comments: Unable to assess   Standing balance support: Single extremity supported Standing balance-Leahy Scale: Poor Standing balance comment: minG for safety, multiple LOB with ambulaiton with single UE support  Cognition Arousal/Alertness: Awake/alert Behavior During Therapy: Restless;Impulsive Overall Cognitive Status: No family/caregiver present to determine baseline cognitive functioning Area of Impairment:  Orientation;Following commands;Problem solving;Safety/judgement                 Orientation Level: Disoriented to;Place;Time;Situation   Memory: Decreased recall of precautions;Decreased short-term memory Following Commands: Follows one step commands with increased time;Follows one step commands inconsistently Safety/Judgement: Decreased awareness of deficits;Decreased awareness of safety   Problem Solving: Difficulty sequencing;Requires tactile cues;Requires verbal cues General Comments: Pt perseverating on getting a soda, asking every staff member for assistance with getting a soda, pt can be redirected, but impulsively standing from bed and chair despite multiple instructions to stay in bed/chair.      Exercises      General Comments        Pertinent Vitals/Pain Pain Assessment: No/denies pain Pain Intervention(s): Limited activity within patient's tolerance;Monitored during session    [place] expects to be discharged to:: Skilled nursing facility                    Prior Function        Comments: Pt was unable to answer any PLOF or home environment questions.  However, likely that pt was completeley independent prior to admission.   PT Goals (current goals can now be found in the care plan section) Acute Rehab PT Goals Patient Stated Goal: to get a soda PT Goal Formulation: Patient unable to participate in goal setting Time For Goal Achievement: 04/25/20 Potential to Achieve Goals: Fair Progress towards PT goals: Progressing toward goals    Frequency    Min 2X/week      PT Plan Current plan remains appropriate    Co-evaluation              AM-PAC PT "6 Clicks" Mobility   Outcome Measure  Help needed turning from your back to your side while in a flat bed without using bedrails?: A Little Help needed moving from lying on your back to sitting on the side of a flat bed without using bedrails?: A Little Help needed moving  to and from a bed to a chair (including a wheelchair)?: A Little Help needed standing up from a chair using your arms (e.g., wheelchair or bedside chair)?: A Little Help needed to walk in hospital room?: A Little Help needed climbing 3-5 steps with a railing? : A Lot 6 Click Score: 17    End of Session Equipment Utilized During Treatment: Gait belt Activity Tolerance: Patient tolerated treatment well Patient left: with call bell/phone within reach;with bed alarm set;in bed Nurse Communication: Mobility status PT Visit Diagnosis: Unsteadiness on feet (R26.81);Other abnormalities of gait and mobility (R26.89)     Time: 1441-1511 PT Time Calculation (min) (ACUTE ONLY): 30 min  Charges:  $Gait Training: 23-37 mins                     Karma Ganja, PT, DPT   Acute Rehabilitation Department Pager #: [phone]   Otho Bellows 04/18/2020, 4:06 PM

## 2020-04-18 NOTE — Progress Notes (Signed)
PROGRESS NOTE  Kenneth Sullivan HXT:[id number] DOB: 10/17/1956   PCP: [facility], [name]  Patient is from: home  DOA: 03/07/2020 LOS: 10  Brief Narrative / Interim history: 64 year old with unknown medical history, not seen a physician for several years.  He presented to the ED on 3/23 after an acute onset shortness of breath with oxygen saturation on 40% by EMS. On site, he had witnessed bradycardic event leading to asystole/cardiac arrest.  ROSC achieved in 20 minutes with 3 rounds of epinephrine.  Brought to ED intubated.  Admitted to ICU. CTPA showed significantly massive PE/DVT and RV strain.  He underwent IR guided mechanical thrombectomy and IVC filter. Postprocedure, while on heparin drip, patient started having maroon-colored stool and bloody drainage from OGT.  GI did EGD which showed oozing gastric ulcer with clot. IR did GDA embolization  Patient remained encephalopathic. 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   4/5, underwent tracheostomy.  Patient is waiting for a PEG tube placement but PEG tube is in Producer, television/film/video. He is currently on tube feed via cortrak.  He is also on dysphagia 2 diet but appetite is low. Was on continuous tube feed at 75 cc an hour but pulled out his cortrak.  SLP upgraded patient to dysphagia 3 diet. A 48-hour calorie count started.  He met > 90% of his calorie and protein needs over the last 24 hours.  Can be discharged to SNF on 5/5 if he does not require restraints and continues to meet caloric intake need over the next 24 hours.  Subjective: Seen and examined earlier this morning.  No major events overnight or this morning.  Good p.o. intake over the last 24 hours.  Still has lap  restraints.  Seems to be hallucinating.  He says "happy Mother's Day, mom". He does not appear to be in distress.  Objective: Vitals:   04/17/20 1603 04/17/20 1953 04/18/20 0420 04/18/20 0736  BP: 102/67 94/76 99/75  109/81  Pulse: (!) 106 (!) 112 (!) 109 (!) 131  Resp: 18 18 20 20   Temp: 97.6 F (36.4 C) (!) 97.4 F (36.3 C) 98.7 F (37.1 C) 97.8 F (36.6 C)  TempSrc:  Oral Oral   SpO2: 98% 97% 98% 96%  Weight:      Height:        Intake/Output Summary (Last 24 hours) at 04/18/2020 1142 Last data filed at 04/18/2020 0400 Gross per 24 hour  Intake 720 ml  Output 300 ml  Net 420 ml   Filed Weights   03/23/20 0342 03/25/20 0500 03/27/20 0500  Weight: 77.8 kg 77.7 kg 70.3 kg    Examination:   GENERAL: No apparent distress.  Nontoxic. HEENT: MMM.  Vision and hearing grossly intact.  NECK: Supple.  No apparent JVD.  RESP:  No IWOB.  Fair aeration bilaterally. CVS:  RRR. Heart sounds normal.  ABD/GI/GU: Bowel sounds present. Soft. Non tender.  MSK/EXT:  Moves extremities. No apparent deformity. No edema.  SKIN: no apparent skin lesion or wound NEURO: Awake, alert but only oriented to self.  No apparent focal neuro deficit. PSYCH: Confused.  No agitation.  Procedures:  ETT 3/23 >>4/5 Trach 4/5 >>changed to 6 cuffless 4/22 >> L TLC 3/23 >> out ALine L Fem 3/23 >> 3/30  Microbiology summarized: Bcx 3/23 >> negative Linus Orn  COV2 3/23 >> negative BCx2 3/23 >> negative   Assessment & Plan: Massive pulmonary embolism leading to sudden cardiac arrest leading to anoxic brain injury -s/p IR guided mechanical thrombectomy, IVC filter.  -Not on anticoagulation because of associated GI bleed and brain bleed.  Acute respiratory failure with hypoxia due to the above -Decannulated on 4/28. PCCM signed off.  Recommended referral to ENT if no stoma closure in 4 weeks.  Acute blood loss anemia due to acute GI bleed: patient started to have melena and fresh blood in OGT post  thrombectomy and heparinization. EGD showed acute GI bleed secondary to large gastric ulcer. IR did GDA embolization. Continue PPI.  H&H stable. -Monitor H&H as needed  Acute encephalopathy due to anoxic brain injury-awake and alert but only oriented to self.  Visual hallucination and agitation at times.  Follows some commands. -On Seroquel 25 mg twice daily and as needed Haldol -Add low-dose Klonopin. -Discontinue lapbelt and monitor -Frequent reorientation and delirium precautions.  Dysphagia -Upgraded to dysphagia 3 diet by SLP.  Not meeting his caloric intake. -Continue calorie count over the next 24 hours  Leukocytosis: Likely demargination from steroid.  Unclear indication of steroid. -Weaned off steroid.  Debility/physical deconditioning -PT/OT-recommended SNF.    Nutrition Problem: Inadequate oral intake Etiology: lethargy/confusion  Signs/Symptoms: meal completion < 25%  Interventions: Ensure Enlive (each supplement provides 350kcal and 20 grams of protein), Prostat, Tube feeding       DVT prophylaxis: SCD Code Status: DNR/DNI Family Communication: None at bedside.  Status is: Inpatient  Remains inpatient appropriate because: For calorie count and agitation  Dispo: The patient is from: Home              Anticipated d/c is to: SNF              Anticipated d/c date is: 1 day (04/19/2020)              Patient currently medically stable if he continues to meet required caloric intake.  Consultants:  Cardiology, GI, interventional radiology, neurolgoy-all signed off.   Sch Meds:  Scheduled Meds: . chlorhexidine gluconate (MEDLINE KIT)  15 mL Mouth Rinse BID  . feeding supplement (ENSURE ENLIVE)  237 mL Oral QID  . glycopyrrolate  1 mg Oral TID  . pantoprazole  40 mg Oral BID  . QUEtiapine  25 mg Oral BID   Continuous Infusions: . sodium chloride     PRN Meds:.sodium chloride, acetaminophen, docusate sodium, gelatin adsorbable,  guaiFENesin-dextromethorphan, haloperidol lactate, hydrALAZINE, ipratropium-albuterol, LORazepam **OR** LORazepam, oxyCODONE, sodium chloride flush  Antimicrobials: Anti-infectives (From admission, onward)   Start     Dose/Rate Route Frequency Ordered Stop   03/26/20 1100  doxycycline (VIBRAMYCIN) 100 mg in sodium chloride 0.9 % 250 mL IVPB  Status:  Discontinued     100 mg 125 mL/hr over 120 Minutes Intravenous 2 times daily 03/26/20 1002 04/01/20 1035   03/25/20 1100  doxycycline (VIBRA-TABS) tablet 100 mg  Status:  Discontinued     100 mg Per Tube Every 12 hours 03/25/20 1009 03/26/20 1002   03/25/20 1015  doxycycline (VIBRAMYCIN) 100 mg in sodium chloride 0.9 % 250 mL IVPB  Status:  Discontinued    Note to Pharmacy: To be given after the tracheal aspirate is collected.   100 mg 125 mL/hr over 120 Minutes Intravenous Every 12 hours 03/25/20 1005 03/25/20 1008       I have personally reviewed the following labs and images: CBC: Recent Labs  Lab 04/12/20  0603 04/16/20 0841  WBC 10.9* 7.4  NEUTROABS 8.5*  --   HGB 12.0* 11.0*  HCT 36.8* 33.5*  MCV 93.4 93.6  PLT 246 187   BMP &GFR Recent Labs  Lab 04/12/20 0603 04/12/20 2123 04/14/20 0403 04/14/20 1703 04/15/20 0555 04/15/20 1650 04/16/20 0841  NA 139  --  138  --  139  --  139  K 4.4  --  4.1  --  4.1  --  4.1  CL 102  --  98  --  101  --  105  CO2 26  --  27  --  27  --  25  GLUCOSE 96  --  97  --  99  --  77  BUN 28*  --  20  --  21  --  21  CREATININE 0.85  --  0.79  --  0.79  --  0.81  CALCIUM 9.2  --  9.0  --  9.6  --  9.0  MG 2.2  --  2.1  --  2.2  --  2.0  PHOS 4.4   < > 4.2 3.9 4.1 3.7 4.2   < > = values in this interval not displayed.   Estimated Creatinine Clearance: 94 mL/min (by C-G formula based on SCr of 0.81 mg/dL). Liver & Pancreas: Recent Labs  Lab 04/14/20 0403 04/15/20 0555 04/16/20 0841  ALBUMIN 2.6* 3.3* 2.7*   No results for input(s): LIPASE, AMYLASE in the last 168 hours. No  results for input(s): AMMONIA in the last 168 hours. Diabetic: No results for input(s): HGBA1C in the last 72 hours. Recent Labs  Lab 04/17/20 1157 04/17/20 1542 04/17/20 1945 04/17/20 2324 04/18/20 0733  GLUCAP 127* 130* 124* 96 105*   Cardiac Enzymes: No results for input(s): CKTOTAL, CKMB, CKMBINDEX, TROPONINI in the last 168 hours. No results for input(s): PROBNP in the last 8760 hours. Coagulation Profile: No results for input(s): INR, PROTIME in the last 168 hours. Thyroid Function Tests: No results for input(s): TSH, T4TOTAL, FREET4, T3FREE, THYROIDAB in the last 72 hours. Lipid Profile: No results for input(s): CHOL, HDL, LDLCALC, TRIG, CHOLHDL, LDLDIRECT in the last 72 hours. Anemia Panel: No results for input(s): VITAMINB12, FOLATE, FERRITIN, TIBC, IRON, RETICCTPCT in the last 72 hours. Urine analysis:    Component Value Date/Time   COLORURINE AMBER (A) 04/11/2020 0153   APPEARANCEUR CLOUDY (A) 04/11/2020 0153   LABSPEC 1.024 04/11/2020 0153   PHURINE 7.0 04/11/2020 0153   GLUCOSEU NEGATIVE 04/11/2020 0153   HGBUR SMALL (A) 04/11/2020 0153   BILIRUBINUR NEGATIVE 04/11/2020 0153   KETONESUR NEGATIVE 04/11/2020 0153   PROTEINUR 100 (A) 04/11/2020 0153   NITRITE NEGATIVE 04/11/2020 0153   LEUKOCYTESUR LARGE (A) 04/11/2020 0153   Sepsis Labs: Invalid input(s): PROCALCITONIN, [place]  Microbiology: Recent Results (from the past 240 hour(s))  Culture, Urine     Status: Abnormal   Collection Time: 04/11/20  1:53 AM   Specimen: Urine, Catheterized  Result Value Ref Range Status   Specimen Description   Final    URINE, CATHETERIZED Performed at [facility], [address], [place], [place] [address]    Special Requests NONE  Final   Culture >=100,000 COLONIES/mL PROTEUS MIRABILIS (A)  Final   Report Status 04/13/2020 FINAL  Final   Organism ID, Bacteria PROTEUS MIRABILIS (A)  Final      Susceptibility   Proteus mirabilis - MIC*    AMPICILLIN <=2  SENSITIVE Sensitive     CEFAZOLIN <=  4 SENSITIVE Sensitive     CEFTRIAXONE <=1 SENSITIVE Sensitive     CIPROFLOXACIN <=0.25 SENSITIVE Sensitive     GENTAMICIN <=1 SENSITIVE Sensitive     IMIPENEM 1 SENSITIVE Sensitive     NITROFURANTOIN 128 RESISTANT Resistant     TRIMETH/SULFA <=20 SENSITIVE Sensitive     AMPICILLIN/SULBACTAM <=2 SENSITIVE Sensitive     PIP/TAZO <=4 SENSITIVE Sensitive     * >=100,000 COLONIES/mL PROTEUS MIRABILIS    Radiology Studies: No results found.   Kesley Gaffey T. [place]  If 7PM-7AM, please contact night-coverage [email or web address] Password [email or web address] 04/18/2020, 11:42 AM

## 2020-04-19 LAB — CBC
HCT: 35.6 % — ABNORMAL LOW (ref 39.0–52.0)
Hemoglobin: 11.5 g/dL — ABNORMAL LOW (ref 13.0–17.0)
MCH: 30.7 pg (ref 26.0–34.0)
MCHC: 32.3 g/dL (ref 30.0–36.0)
MCV: 94.9 fL (ref 80.0–100.0)
Platelets: 193 10*3/uL (ref 150–400)
RBC: 3.75 MIL/uL — ABNORMAL LOW (ref 4.22–5.81)
RDW: 18 % — ABNORMAL HIGH (ref 11.5–15.5)
WBC: 8 10*3/uL (ref 4.0–10.5)
nRBC: 0 % (ref 0.0–0.2)

## 2020-04-19 LAB — RENAL FUNCTION PANEL
Albumin: 3 g/dL — ABNORMAL LOW (ref 3.5–5.0)
Anion gap: 14 (ref 5–15)
BUN: 21 mg/dL (ref 8–23)
CO2: 23 mmol/L (ref 22–32)
Calcium: 9 mg/dL (ref 8.9–10.3)
Chloride: 100 mmol/L (ref 98–111)
Creatinine, Ser: 0.86 mg/dL (ref 0.61–1.24)
GFR calc Af Amer: >60 mL/min (ref >60)
GFR calc non Af Amer: >60 mL/min (ref >60)
Glucose, Bld: 108 mg/dL — ABNORMAL HIGH (ref 70–99)
Phosphorus: 3.2 mg/dL (ref 2.5–4.6)
Potassium: 3.7 mmol/L (ref 3.5–5.1)
Sodium: 137 mmol/L (ref 135–145)

## 2020-04-19 LAB — GLUCOSE, CAPILLARY: Glucose-Capillary: 115 mg/dL — ABNORMAL HIGH (ref 70–99)

## 2020-04-19 LAB — MAGNESIUM: Magnesium: 1.9 mg/dL (ref 1.7–2.4)

## 2020-04-19 MED ORDER — SODIUM CHLORIDE 0.9 % IV SOLN: INTRAVENOUS | Status: AC

## 2020-04-19 NOTE — Progress Notes (Signed)
PROGRESS NOTE    Kenneth Sullivan  UQJ:[id number] DOB: August 15, 1956 DOA: 03/07/2020 PCP: Medicine, [name]   Brief Narrative: 64 year old with unknown medical history, who has not seen a physician for several years prior to admission. He presented to the ED on 3/23 after an acute onset of shortness of breath with oxygen saturation in the 40% measure by EMS. Onsite, he had witnessed bradycardic event leading to asystolic/cardiac arrest. ROSC achieved in 20 minutes with 3 rounds of epinephrine.  Patient was brought to the ED intubated. Admitted to the ICU. CTPA showed massive PE/DVT and right ventricular strain. Underwent IR guided mechanical thrombectomy and IVC filter. Post procedure, while on heparin drip patient started having maroon-colored red stool and bloody drainage from OGT. GI did endoscopy which showed oozing  gastric ulcer with clots. IR did GDA embolization.  Patient remained encephalopathic. On 3/27, MRI of the brain showed multifocal acute to subacute ischemic infarct involving bilateral cerebral hemisphere consistent with global hypoperfusion related to cardiac arrest, also associated with scatter petechial hemorrhage with evidence of hemorrhagic conversion in the parietal lobe. On 4/5, underwent tracheostomy. Patient had dysphagia, he was a started on dysphagia 2 diet. He had poor appetite. His appetite has improved and he is not longer requiring tube feedings. No need for PEG tube at this time.    Assessment & Plan:   Principal Problem:   Cardiac arrest [facility]) Active Problems:   Encounter for central line placement   GI bleed   Acute respiratory failure (HCC)   Anoxic encephalopathy (HCC)   Cerebral embolism with cerebral infarction   Pulmonary emboli ([place])   Palliative care by specialist   Goals of care, counseling/discussion   DNR (do not resuscitate)   Status post tracheostomy ([place])  1-Massive pulmonary embolism leading to sudden cardiac arrest, leading  to anoxic brain injury: -Status post IR guided mechanical thrombectomy, IVC filter. -Not on anticoagulation because of associated GI bleed and brain bleed.  2-Acute respiratory failure with hypoxia due to massive PE -Decannulated on 4/28. PCCM signed off. Recommend referral to ENT if no stoma closure in 4 weeks. -Currently on RA.   3-Acute blood loss anemia due to acute GI bleed: Patient is started to have melena and fresh blood in OGT post thrombectomy and heparinization. Endoscopy show acute GI bleed secondary to large gastric ulcer. IR performed  GDA embolization. Continue with PPI. Hemoglobin remained stable.  4-Acute encephalopathy due to anoxic brain injury:; Patient is awake and alert but only oriented to self. Visual Hallucination and sedation agitation at times. On Seroquel 25 mg twice daily and Haldol as needed. Started on low-dose Klonopin. Delirium precaution  5-Dysphagia;  Upgraded to dysphagia 3 diet by speech. He is now able to meet his caloric needs.  6-leukocytosis: Likely demargination from steroid. He was weaned off of the steroid. Resolved  Debility physical deconditioning: PT OT recommending a skilled nursing facility  Nutrition Problem: Inadequate oral intake Etiology: lethargy/confusion    Signs/Symptoms: meal completion < 25%    Interventions: Ensure Enlive (each supplement provides 350kcal and 20 grams of protein), Prostat, Tube feeding  Estimated body mass index is 19.9 kg/m as calculated from the following:   Height as of this encounter: 6' 2"  (1.88 m).   Weight as of this encounter: 70.3 kg.   DVT prophylaxis:  Code Status:  Family Communication: Disposition Plan:  Patient is from: Home Anticipated d/c date: Stable for discharge when bed available at  facility Anticipated to be discharged to skilled  nursing facility Barriers to d/c or necessity for inpatient status: Medical stable for discharge   Consultants:   CCM, neurology,  IR,  Procedures:  ETT 3/23 >>4/5 Trach 4/5 >>changed to 6 cuffless 4/22 >> L TLC 3/23 >> out ALine L Fem 3/23 >> 3/30  Antimicrobials:  None.  Microbiology:  Bcx 3/23 >> negative [name] 3/23 >> negative BCx2 3/23 >>negative   Subjective: He is alert, got out of bed.  He has been eating well, no need for Peg tube   Objective: Vitals:   04/18/20 0420 04/18/20 0736 04/18/20 2129 04/19/20 1021  BP: 99/75 109/81 (!) 84/62 95/73  Pulse: (!) 109 (!) 131 (!) 105 (!) 108  Resp: 20 20 20    Temp: 98.7 F (37.1 C) 97.8 F (36.6 C) 98.3 F (36.8 C) 98.1 F (36.7 C)  TempSrc: Oral   Oral  SpO2: 98% 96% 95% 99%  Weight:      Height:        Intake/Output Summary (Last 24 hours) at 04/19/2020 1255 Last data filed at 04/19/2020 0500 Gross per 24 hour  Intake 600 ml  Output --  Net 600 ml   Filed Weights   03/23/20 0342 03/25/20 0500 03/27/20 0500  Weight: 77.8 kg 77.7 kg 70.3 kg    Examination:  General exam: Appears calm and comfortable  Respiratory system: Clear to auscultation. Respiratory effort normal. Cardiovascular system: S1 & S2 heard, RRR. No JVD, murmurs, rubs, gallops or clicks. No pedal edema. Gastrointestinal system: Abdomen is nondistended, soft and nontender. No organomegaly or masses felt. Normal bowel sounds heard. Central nervous system: Alert, confused Extremities: Symmetric 5 x 5 power. Skin: No rashes, lesions or ulcers   Data Reviewed: I have personally reviewed following labs and imaging studies  CBC: Recent Labs  Lab 04/16/20 0841 04/19/20 0808  WBC 7.4 8.0  HGB 11.0* 11.5*  HCT 33.5* 35.6*  MCV 93.6 94.9  PLT 187 735   Basic Metabolic Panel: Recent Labs  Lab 04/14/20 0403 04/14/20 0403 04/14/20 1703 04/15/20 0555 04/15/20 1650 04/16/20 0841 04/19/20 0808  NA 138  --   --  139  --  139 137  K 4.1  --   --  4.1  --  4.1 3.7  CL 98  --   --  101  --  105 100  CO2 27  --   --  27  --  25 23  GLUCOSE 97  --   --  99  --  77  108*  BUN 20  --   --  21  --  21 21  CREATININE 0.79  --   --  0.79  --  0.81 0.86  CALCIUM 9.0  --   --  9.6  --  9.0 9.0  MG 2.1  --   --  2.2  --  2.0 1.9  PHOS 4.2   < > 3.9 4.1 3.7 4.2 3.2   < > = values in this interval not displayed.   GFR: Estimated Creatinine Clearance: 88.6 mL/min (by C-G formula based on SCr of 0.86 mg/dL). Liver Function Tests: Recent Labs  Lab 04/14/20 0403 04/15/20 0555 04/16/20 0841 04/19/20 0808  ALBUMIN 2.6* 3.3* 2.7* 3.0*   No results for input(s): LIPASE, AMYLASE in the last 168 hours. No results for input(s): AMMONIA in the last 168 hours. Coagulation Profile: No results for input(s): INR, PROTIME in the last 168 hours. Cardiac Enzymes: No results for input(s): CKTOTAL, CKMB, CKMBINDEX, TROPONINI in  the last 168 hours. BNP (last 3 results) No results for input(s): PROBNP in the last 8760 hours. HbA1C: No results for input(s): HGBA1C in the last 72 hours. CBG: Recent Labs  Lab 04/17/20 1945 04/17/20 2324 04/18/20 0733 04/18/20 1239 04/18/20 1627  GLUCAP 124* 96 105* 120* 107*   Lipid Profile: No results for input(s): CHOL, HDL, LDLCALC, TRIG, CHOLHDL, LDLDIRECT in the last 72 hours. Thyroid Function Tests: No results for input(s): TSH, T4TOTAL, FREET4, T3FREE, THYROIDAB in the last 72 hours. Anemia Panel: No results for input(s): VITAMINB12, FOLATE, FERRITIN, TIBC, IRON, RETICCTPCT in the last 72 hours. Sepsis Labs: No results for input(s): PROCALCITON, LATICACIDVEN in the last 168 hours.  Recent Results (from the past 240 hour(s))  Culture, Urine     Status: Abnormal   Collection Time: 04/11/20  1:53 AM   Specimen: Urine, Catheterized  Result Value Ref Range Status   Specimen Description   Final    URINE, CATHETERIZED Performed at [facility], [address] [place], [place] [address]    Special Requests NONE  Final   Culture >=100,000 COLONIES/mL PROTEUS MIRABILIS (A)  Final   Report Status 04/13/2020 FINAL   Final   Organism ID, Bacteria PROTEUS MIRABILIS (A)  Final      Susceptibility   Proteus mirabilis - MIC*    AMPICILLIN <=2 SENSITIVE Sensitive     CEFAZOLIN <=4 SENSITIVE Sensitive     CEFTRIAXONE <=1 SENSITIVE Sensitive     CIPROFLOXACIN <=0.25 SENSITIVE Sensitive     GENTAMICIN <=1 SENSITIVE Sensitive     IMIPENEM 1 SENSITIVE Sensitive     NITROFURANTOIN 128 RESISTANT Resistant     TRIMETH/SULFA <=20 SENSITIVE Sensitive     AMPICILLIN/SULBACTAM <=2 SENSITIVE Sensitive     PIP/TAZO <=4 SENSITIVE Sensitive     * >=100,000 COLONIES/mL PROTEUS MIRABILIS         Radiology Studies: No results found.      Scheduled Meds: . chlorhexidine gluconate (MEDLINE KIT)  15 mL Mouth Rinse BID  . feeding supplement (ENSURE ENLIVE)  237 mL Oral QID  . glycopyrrolate  1 mg Oral TID  . pantoprazole  40 mg Oral BID  . QUEtiapine  25 mg Oral BID   Continuous Infusions: . sodium chloride       LOS: 43 days    Time spent: 35 minutes.     Elmarie Shiley, MD [facility]   If 7PM-7AM, please contact night-coverage [email or web address]  04/19/2020, 12:55 PM

## 2020-04-19 NOTE — Progress Notes (Signed)
  Speech Language Pathology Treatment: Dysphagia  Patient Details Name: Kenneth Sullivan MRN: [id number] DOB: [birth year] Today's Date: 04/19/2020 Time: 5170-0174 SLP Time Calculation (min) (ACUTE ONLY): 21 min  Assessment / Plan / Recommendation Clinical Impression  Pt encountered walking in hall with nurse tech. Treatment focused on oral mastication, manipulation for possible upgrade to regular texture if safe. Functional, rotary mastication pattern with peanut butter on graham cracker and clearance. He needs moderate tactile cues to interrupt his consecutive straw volumes of Ensure. He had one slight and delayed cough. Will upgrade texture to regular, continue thin, continue full supervision to follow strategies and to locate his food due to severe visual impairment post stroke. ST will sign off at this time.    HPI HPI: 64 year old admitted 3/23 with shortness of breath with sats in the 40s on with witnessed bradycardic, asystolic arrest when EMS was present with 3 rounds of epi, 20 minutes to ROSC per chart. Found to also have bilateral PR's and had GI bleed following arrest. MRI showed multifocal acute-subactue ischemic infarcts c/w global hypoperfusion. Intubated 3/23 adn trach placed 4/2. CXR 4/3 no active disease. No evidence of pneumonia or pulmonary edema. Per chart pt smokes 1 pack/day, consumes 2-3 forty oz beers daily, + marijuana.      SLP Plan  All goals met;Discharge SLP treatment due to (comment)       Recommendations  Diet recommendations: Regular;Thin liquid Liquids provided via: Straw;Cup Medication Administration: Whole meds with puree Supervision: Staff to assist with self feeding;Full supervision/cueing for compensatory strategies Compensations: Minimize environmental distractions;Slow rate;Small sips/bites Postural Changes and/or Swallow Maneuvers: Seated upright 90 degrees                Oral Care Recommendations: Oral care BID Follow up Recommendations: 24 hour  supervision/assistance;Skilled Nursing facility SLP Visit Diagnosis: Dysphagia, unspecified (R13.10) Plan: All goals met;Discharge SLP treatment due to (comment)                       Houston Siren 04/19/2020, 2:44 PM  [name] Colvin Caroli.Ed Risk analyst [phone] Office [phone]

## 2020-04-19 NOTE — Progress Notes (Signed)
Calorie Count Note: Day 2  48-hour calorie count ordered. Calorie count started 04/17/20 at breakfast meal. Calorie count ended yesterday (04/18/20) after dinner meal.  Diet: Dysphagia 3, thin liquids Supplements: Ensure Enlive QID  Day 2 (04/18/20): Breakfast: 419 kcal, 15 grams protein Lunch: 692 kcal, 34 grams protein Dinner: no information available for review Supplements: 1400 kcal, 80 grams protein (4 Ensure Enlive supplements)  Total 24-hour intake: 2511 kcal (>100% of minimum estimated needs)  129 protein (>100% of minimum estimated needs)  Nutrition Diagnosis: Inadequate oral intakerelated to lethargy/confusionas evidenced by meal completion < 25%.  Progressing  Goal: Patient will meet greater than or equal to 90% of their needs  Met  Intervention: - d/c 48-hour calorie count - Ensure Enlive QID   Gaynell Face, MS, RD, LDN Inpatient Clinical Dietitian Pager: [phone] Weekend/After Hours: [phone]

## 2020-04-19 NOTE — Progress Notes (Signed)
Patient up ambulating in the hallways overnight, steady gate, able to redirect to the bed.  Patient has been cooperative and in good spirits.  Patient is hungry all night with multiple snacks given.

## 2020-04-19 NOTE — Progress Notes (Signed)
Pt had a fall. He was in a low bed with fall mats and bed alarm on. He attempted to go to the bathroom by himself. Nurse Salome Holmes found patient on floor in bathroom. Pt could not remember if he hit his head. Terri NT notified pt's LPN (Iris). Agricultural consultant Hoyle Sauer) notified. Bed huddle completed. Pt assesed and transferred back to bed with assistance. Hoyle Sauer RN paged [place] MD. Pt transferred to bed closer to nurses station (RM 29). RN received order from [facility] MD for pt to have a sitter tonight. 2W director [place] notified of fall.   Iris LPN will continue to monitor pt.

## 2020-04-20 LAB — GLUCOSE, CAPILLARY
Glucose-Capillary: 103 mg/dL — ABNORMAL HIGH (ref 70–99)
Glucose-Capillary: 104 mg/dL — ABNORMAL HIGH (ref 70–99)
Glucose-Capillary: 105 mg/dL — ABNORMAL HIGH (ref 70–99)
Glucose-Capillary: 106 mg/dL — ABNORMAL HIGH (ref 70–99)
Glucose-Capillary: 89 mg/dL (ref 70–99)

## 2020-04-20 MED ORDER — LORAZEPAM 2 MG/ML IJ SOLN: 1.0000 mg | Freq: Three times a day (TID) | INTRAMUSCULAR | Status: DC | PRN

## 2020-04-20 MED ORDER — LORAZEPAM 2 MG/ML IJ SOLN
INTRAMUSCULAR | Status: AC
  Administered 2020-04-20: 2 mg via INTRAVENOUS
  Filled 2020-04-20: qty 1

## 2020-04-20 MED ORDER — QUETIAPINE FUMARATE 50 MG PO TABS
50.0000 mg | ORAL_TABLET | Freq: Two times a day (BID) | ORAL | Status: DC
  Administered 2020-04-20 – 2020-05-15 (×49): 50 mg via ORAL
  Filled 2020-04-20 (×49): qty 1

## 2020-04-20 MED ORDER — LORAZEPAM 2 MG/ML IJ SOLN
1.0000 mg | Freq: Four times a day (QID) | INTRAMUSCULAR | Status: DC | PRN
  Administered 2020-04-20 – 2020-04-23 (×4): 1 mg via INTRAVENOUS
  Filled 2020-04-20 (×4): qty 1

## 2020-04-20 MED ORDER — LORAZEPAM 1 MG PO TABS
1.0000 mg | ORAL_TABLET | Freq: Four times a day (QID) | ORAL | Status: DC | PRN
  Administered 2020-04-21 – 2020-04-23 (×4): 1 mg via ORAL
  Filled 2020-04-20 (×5): qty 1

## 2020-04-20 NOTE — Progress Notes (Signed)
PROGRESS NOTE    Kenneth Sullivan  [id number]:[id number] DOB: 1956-11-20 DOA: 03/07/2020 PCP: Medicine, [name]   Brief Narrative: 64 year old with unknown medical history, who has not seen a physician for several years prior to admission. He presented to the ED on 3/23 after an acute onset of shortness of breath with oxygen saturation in the 40% measure by EMS. Onsite, he had witnessed bradycardic event leading to asystolic/cardiac arrest. ROSC achieved in 20 minutes with 3 rounds of epinephrine.  Patient was brought to the ED intubated. Admitted to the ICU. CTPA showed massive PE/DVT and right ventricular strain. Underwent IR guided mechanical thrombectomy and IVC filter. Post procedure, while on heparin drip patient started having maroon-colored red stool and bloody drainage from OGT. GI did endoscopy which showed oozing  gastric ulcer with clots. IR did GDA embolization.  Patient remained encephalopathic. On 3/27, MRI of the brain showed multifocal acute to subacute ischemic infarct involving bilateral cerebral hemisphere consistent with global hypoperfusion related to cardiac arrest, also associated with scatter petechial hemorrhage with evidence of hemorrhagic conversion in the parietal lobe. On 4/5, underwent tracheostomy. Patient had dysphagia, he was a started on dysphagia 2 diet. He had poor appetite. His appetite has improved and he is not longer requiring tube feedings. No need for PEG tube at this time.    Assessment & Plan:   Principal Problem:   Cardiac arrest [facility]) Active Problems:   Encounter for central line placement   GI bleed   Acute respiratory failure (HCC)   Anoxic encephalopathy (HCC)   Cerebral embolism with cerebral infarction   Pulmonary emboli ([place])   Palliative care by specialist   Goals of care, counseling/discussion   DNR (do not resuscitate)   Status post tracheostomy ([place])  1-Massive pulmonary embolism leading to sudden cardiac arrest, leading  to anoxic brain injury: -Status post IR guided mechanical thrombectomy, IVC filter. -Not on anticoagulation because of associated GI bleed and brain bleed.  2-Acute respiratory failure with hypoxia due to massive PE -Decannulated on 4/28. PCCM signed off. Recommend referral to ENT if no stoma closure in 4 weeks. -Currently on RA.   3-Acute blood loss anemia due to acute GI bleed: Patient is started to have melena and fresh blood in OGT post thrombectomy and heparinization. Endoscopy show acute GI bleed secondary to large gastric ulcer. IR performed  GDA embolization. Continue with PPI. Hemoglobin remained stable.  4-Acute encephalopathy due to anoxic brain injury:; Patient is awake and alert but only oriented to self. Visual Hallucination and sedation agitation at times. On Seroquel 25 mg twice daily and Haldol as needed. Increase Seroquel dose, change ativan to Q 6 hours.  Psych consulted.  Delirium precaution  5-Dysphagia;  Upgraded to dysphagia 3 diet by speech. He is now able to meet his caloric needs.  6-leukocytosis: Likely demargination from steroid. He was weaned off of the steroid. Resolved  Debility physical deconditioning: PT OT recommending a skilled nursing facility  Nutrition Problem: Inadequate oral intake Etiology: lethargy/confusion    Signs/Symptoms: meal completion < 25%    Interventions: Ensure Enlive (each supplement provides 350kcal and 20 grams of protein), Prostat, Tube feeding  Estimated body mass index is 19.9 kg/m as calculated from the following:   Height as of this encounter: 6' 2"  (1.88 m).   Weight as of this encounter: 70.3 kg.   DVT prophylaxis:  Code Status:  Family Communication: Disposition Plan:  Patient is from: Home Anticipated d/c date: Stable for discharge when bed available  at  facility Anticipated to be discharged to skilled nursing facility Barriers to d/c or necessity for inpatient status: Medical stable for discharge     Consultants:   CCM, neurology, IR,  Procedures:  ETT 3/23 >>4/5 Trach 4/5 >>changed to 6 cuffless 4/22 >> L TLC 3/23 >> out ALine L Fem 3/23 >> 3/30  Antimicrobials:  None.  Microbiology:  Bcx 3/23 >> negative [name] 3/23 >> negative BCx2 3/23 >>negative   Subjective: He has been restless, trying to get out bed, standing by himself.  He has not been able to sleep at night.    Objective: Vitals:   04/19/20 1021 04/19/20 1547 04/19/20 2224 04/20/20 0723  BP: 95/73 103/67 112/74 105/78  Pulse: (!) 108 (!) 117 (!) 113 94  Resp:   16   Temp: 98.1 F (36.7 C)  98.4 F (36.9 C) 98.2 F (36.8 C)  TempSrc: Oral  Oral   SpO2: 99% 99% 99% 97%  Weight:      Height:       No intake or output data in the 24 hours ending 04/20/20 1154 Filed Weights   03/23/20 0342 03/25/20 0500 03/27/20 0500  Weight: 77.8 kg 77.7 kg 70.3 kg    Examination:  General exam: NAD Respiratory system: CTA Cardiovascular system: S 1, S 2 RRR Gastrointestinal system:BS present, soft, nt Central nervous system: Alert, confuse.  Extremities: Symmetric power Data Reviewed: I have personally reviewed following labs and imaging studies  CBC: Recent Labs  Lab 04/16/20 0841 04/19/20 0808  WBC 7.4 8.0  HGB 11.0* 11.5*  HCT 33.5* 35.6*  MCV 93.6 94.9  PLT 187 161   Basic Metabolic Panel: Recent Labs  Lab 04/14/20 0403 04/14/20 0403 04/14/20 1703 04/15/20 0555 04/15/20 1650 04/16/20 0841 04/19/20 0808  NA 138  --   --  139  --  139 137  K 4.1  --   --  4.1  --  4.1 3.7  CL 98  --   --  101  --  105 100  CO2 27  --   --  27  --  25 23  GLUCOSE 97  --   --  99  --  77 108*  BUN 20  --   --  21  --  21 21  CREATININE 0.79  --   --  0.79  --  0.81 0.86  CALCIUM 9.0  --   --  9.6  --  9.0 9.0  MG 2.1  --   --  2.2  --  2.0 1.9  PHOS 4.2   < > 3.9 4.1 3.7 4.2 3.2   < > = values in this interval not displayed.   GFR: Estimated Creatinine Clearance: 88.6 mL/min (by C-G formula  based on SCr of 0.86 mg/dL). Liver Function Tests: Recent Labs  Lab 04/14/20 0403 04/15/20 0555 04/16/20 0841 04/19/20 0808  ALBUMIN 2.6* 3.3* 2.7* 3.0*   No results for input(s): LIPASE, AMYLASE in the last 168 hours. No results for input(s): AMMONIA in the last 168 hours. Coagulation Profile: No results for input(s): INR, PROTIME in the last 168 hours. Cardiac Enzymes: No results for input(s): CKTOTAL, CKMB, CKMBINDEX, TROPONINI in the last 168 hours. BNP (last 3 results) No results for input(s): PROBNP in the last 8760 hours. HbA1C: No results for input(s): HGBA1C in the last 72 hours. CBG: Recent Labs  Lab 04/18/20 1627 04/19/20 1937 04/20/20 0006 04/20/20 0540 04/20/20 0722  GLUCAP 107* 115* 104* 89 106*  Lipid Profile: No results for input(s): CHOL, HDL, LDLCALC, TRIG, CHOLHDL, LDLDIRECT in the last 72 hours. Thyroid Function Tests: No results for input(s): TSH, T4TOTAL, FREET4, T3FREE, THYROIDAB in the last 72 hours. Anemia Panel: No results for input(s): VITAMINB12, FOLATE, FERRITIN, TIBC, IRON, RETICCTPCT in the last 72 hours. Sepsis Labs: No results for input(s): PROCALCITON, LATICACIDVEN in the last 168 hours.  Recent Results (from the past 240 hour(s))  Culture, Urine     Status: Abnormal   Collection Time: 04/11/20  1:53 AM   Specimen: Urine, Catheterized  Result Value Ref Range Status   Specimen Description   Final    URINE, CATHETERIZED Performed at [facility], [address] [place], [place] [address]    Special Requests NONE  Final   Culture >=100,000 COLONIES/mL PROTEUS MIRABILIS (A)  Final   Report Status 04/13/2020 FINAL  Final   Organism ID, Bacteria PROTEUS MIRABILIS (A)  Final      Susceptibility   Proteus mirabilis - MIC*    AMPICILLIN <=2 SENSITIVE Sensitive     CEFAZOLIN <=4 SENSITIVE Sensitive     CEFTRIAXONE <=1 SENSITIVE Sensitive     CIPROFLOXACIN <=0.25 SENSITIVE Sensitive     GENTAMICIN <=1 SENSITIVE Sensitive      IMIPENEM 1 SENSITIVE Sensitive     NITROFURANTOIN 128 RESISTANT Resistant     TRIMETH/SULFA <=20 SENSITIVE Sensitive     AMPICILLIN/SULBACTAM <=2 SENSITIVE Sensitive     PIP/TAZO <=4 SENSITIVE Sensitive     * >=100,000 COLONIES/mL PROTEUS MIRABILIS         Radiology Studies: No results found.      Scheduled Meds: . chlorhexidine gluconate (MEDLINE KIT)  15 mL Mouth Rinse BID  . feeding supplement (ENSURE ENLIVE)  237 mL Oral QID  . glycopyrrolate  1 mg Oral TID  . pantoprazole  40 mg Oral BID  . QUEtiapine  50 mg Oral BID   Continuous Infusions: . sodium chloride    . sodium chloride 75 mL/hr at 04/19/20 1840     LOS: 44 days    Time spent: 35 minutes.     Elmarie Shiley, MD [facility]   If 7PM-7AM, please contact night-coverage [email or web address]  04/20/2020, 11:54 AM

## 2020-04-20 NOTE — Progress Notes (Signed)
Patient continues to be restless and agitated.  Patient has been taken to the bathroom, offered food and drink, has been ambulated and taken outside 3 times with no effect.  Staff literally must keep their hands on the patient at all times in order to prevent him from getting up.  Patient is incessantly on the move.  Patient now becoming confrontational with staff.  Attempting to hit and kick.  Bilateral hand mitts applied for safety.  Patient placed in bed for safety.  Matts in place on the floor.  MD made aware of above.  New orders received.  Ativan given per orders.  Effect pending.

## 2020-04-20 NOTE — Progress Notes (Signed)
Patient very restless today despite receiving Haldol and Ativan.  Patient able to get up from the bed.  Before safety sitter could get to him, the patient fell on the floor.  No injuries noted during exam post fall.  MD and Niece made aware of fall.  There is an order for a psych consult pending due to these behaviors.

## 2020-04-21 LAB — GLUCOSE, CAPILLARY
Glucose-Capillary: 121 mg/dL — ABNORMAL HIGH (ref 70–99)
Glucose-Capillary: 128 mg/dL — ABNORMAL HIGH (ref 70–99)
Glucose-Capillary: 82 mg/dL (ref 70–99)
Glucose-Capillary: 110 mg/dL — ABNORMAL HIGH (ref 70–99)

## 2020-04-21 LAB — PLATELET INHIBITION P2Y12: Platelet Function  P2Y12: 290 [PRU] (ref 182–335)

## 2020-04-21 MED ORDER — FLUOXETINE HCL 10 MG PO CAPS
10.0000 mg | ORAL_CAPSULE | Freq: Every day | ORAL | Status: DC
  Administered 2020-04-21 – 2020-06-30 (×65): 10 mg via ORAL
  Filled 2020-04-21 (×73): qty 1

## 2020-04-21 MED ORDER — OXCARBAZEPINE 150 MG PO TABS
75.0000 mg | ORAL_TABLET | Freq: Two times a day (BID) | ORAL | Status: DC
  Administered 2020-04-21 – 2020-07-03 (×138): 75 mg via ORAL
  Filled 2020-04-21 (×148): qty 0.5

## 2020-04-21 NOTE — Consult Note (Signed)
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 male patient admitted with shortness of breath, cardiac arrest, anoxic brain injury, PE, and multifocal ischemic infarcts in cerebral hemisphere and parietal lobe. Patient medical condition has been complicated by all factors listed above, is currently a DNR and awaiting transfer to a nursing facility. Psych has been consulted due to recent agitation and aggressive behaviors by patient. Patient denies history of psychiatric conditions. He continued to refute any past psych history to include inpatient treatment, outpatient treatment, suicide attempts. He denied history of aggressive behaviors, violence, trauma, or legal history. He denies history of substance abuse, military experience, or family history of mental. He is single, unemployed and resides with his niece prior to this admission. He denies having any children.   HPI:  26 year old with unknown medical history, who has not seen a physician for several years prior to admission. He presented to the ED on 3/23 after an acute onset of shortness of breath with oxygen saturation in the 40% measure by EMS. Onsite, he had witnessed bradycardic event leading to asystolic/cardiac arrest. ROSC  achieved in 20 minutes with 3 rounds of epinephrine.  CTPA showed massive PE/DVT and right ventricular strain.  Post procedure, while on heparin drip patient started having maroon-colored red stool and bloody drainage from OGT. GI did endoscopy which showed oozing gastric ulcer with clots. IR did GDA embolization.  Patient remained encephalopathic. On 3/27, MRI of the brain showed multifocal acute to subacute ischemic infarct involving bilateral cerebral hemisphere consistent with global hypoperfusion related to cardiac arrest, also associated with scatter petechial hemorrhage with evidence of hemorrhagic conversion in the parietal lobe.  Patient is alert and oriented, yet drowsy on evaluation. He is observed to be sitting upright in the bed. He is responsive and observed to be dosing off throughout the evaluation. The safety sitter assisted with lightly awaking the patient for this Probation officer x2. As noted above patient denies any history of psych conditions. He reports that he is able to control his anger at this time. He denies any suicidal ideations, gestures, thoughts, or attempts. He denies any overt aggression, agitation, violence, homicidal ideation. At this time patient appears to be psychiatrically stable.   Past Psychiatric History: Denies  Risk to Self:   No Risk to Others:   No Prior Inpatient Therapy:   No Prior Outpatient Therapy:   No  Past Medical History:  Past Medical History:  Diagnosis Date  . Cardiac arrest ([place]) 03/07/2020  . Known health problems: none    No recent medical care, has not had a physical in years    Past Surgical History:  Procedure Laterality Date  . ESOPHAGOGASTRODUODENOSCOPY (EGD) WITH PROPOFOL N/A 03/07/2020   Procedure: ESOPHAGOGASTRODUODENOSCOPY (EGD) WITH PROPOFOL;  Surgeon: Wilford Corner, MD;  Location: [place];  Service: Endoscopy;  Laterality: N/A;  . IR ANGIOGRAM PULMONARY BILATERAL SELECTIVE  03/07/2020  . IR ANGIOGRAM SELECTIVE EACH  ADDITIONAL VESSEL  03/07/2020  . IR ANGIOGRAM SELECTIVE EACH ADDITIONAL VESSEL  03/07/2020  . IR ANGIOGRAM SELECTIVE EACH ADDITIONAL VESSEL  03/07/2020  . IR ANGIOGRAM VISCERAL SELECTIVE  03/07/2020  . IR EMBO ART  VEN HEMORR LYMPH EXTRAV  INC GUIDE ROADMAPPING  03/07/2020  . IR IVC FILTER PLMT / S&I /IMG GUID/MOD SED  03/07/2020  . IR THROMBECT PRIM MECH INIT (INCLU) MOD SED  03/07/2020  . IR US GUIDE VASC ACCESS RIGHT  03/07/2020  . IR US GUIDE VASC ACCESS RIGHT  03/07/2020  . IR US GUIDE VASC ACCESS RIGHT  03/07/2020   Family History:  Family History  Problem Relation Age of Onset  . Heart attack Neg Hx    Family Psychiatric  History: None Social History:  Social History   Substance and Sexual Activity  Alcohol Use Yes  . Alcohol/week: 21.0 standard drinks  . Types: 21 Cans of beer per week   Comment: 2 or 3  -40 ounce beers a day     Social History   Substance and Sexual Activity  Drug Use Yes  . Types: Marijuana    Social History   Socioeconomic History  . Marital status: Single    Spouse name: Not on file  . Number of children: Not on file  . Years of education: Not on file  . Highest education level: Not on file  Occupational History  . Not on file  Tobacco Use  . Smoking status: Current Every Day Smoker    Packs/day: 1.00    Types: Cigarettes  . Smokeless tobacco: Never Used  Substance and Sexual Activity  . Alcohol use: Yes    Alcohol/week: 21.0 standard drinks    Types: 21 Cans of beer per week    Comment: 2 or 3  -40 ounce beers a day  . Drug use: Yes    Types: Marijuana  . Sexual activity: Not on file  Other Topics Concern  . Not on file  Social History Narrative   Lives with wife.  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   Social Determinants of Health   Financial Resource Strain:   . Difficulty of Paying Living Expenses:   Food Insecurity:   . Worried About Charity fundraiser in the Last Year:   . Arboriculturist in the Last Year:    Transportation Needs:   . Film/video editor (Medical):   Marland Kitchen Lack of Transportation (Non-Medical):   Physical Activity:   . Days of Exercise per Week:   . Minutes of Exercise per Session:   Stress:   . Feeling of Stress :   Social Connections:   . Frequency of Communication with Friends and Family:   . Frequency of Social Gatherings with Friends and Family:   . Attends Religious Services:   . Active Member of Clubs or Organizations:   . Attends Archivist Meetings:   Marland Kitchen Marital Status:    Additional Social History:    Allergies:  No Known Allergies  Labs:  Results for orders placed or performed during the hospital encounter of 03/07/20 (from the past 48 hour(s))  Glucose, capillary     Status: Abnormal   Collection Time: 04/19/20  7:37 PM  Result Value Ref Range   Glucose-Capillary 115 (H) 70 - 99 mg/dL    Comment: Glucose reference range applies only to samples  taken after fasting for at least 8 hours.  Glucose, capillary     Status: Abnormal   Collection Time: 04/20/20 12:06 AM  Result Value Ref Range   Glucose-Capillary 104 (H) 70 - 99 mg/dL    Comment: Glucose reference range applies only to samples taken after fasting for at least 8 hours.  Glucose, capillary     Status: None   Collection Time: 04/20/20  5:40 AM  Result Value Ref Range   Glucose-Capillary 89 70 - 99 mg/dL    Comment: Glucose reference range applies only to samples taken after fasting for at least 8 hours.  Glucose, capillary     Status: Abnormal   Collection Time: 04/20/20  7:22 AM  Result Value Ref Range   Glucose-Capillary 106 (H) 70 - 99 mg/dL    Comment: Glucose reference range applies only to samples taken after fasting for at least 8 hours.  Glucose, capillary     Status: Abnormal   Collection Time: 04/20/20  3:37 PM  Result Value Ref Range   Glucose-Capillary 105 (H) 70 - 99 mg/dL    Comment: Glucose reference range applies only to samples taken after fasting for at least 8  hours.  Glucose, capillary     Status: Abnormal   Collection Time: 04/20/20  7:21 PM  Result Value Ref Range   Glucose-Capillary 103 (H) 70 - 99 mg/dL    Comment: Glucose reference range applies only to samples taken after fasting for at least 8 hours.  Glucose, capillary     Status: Abnormal   Collection Time: 04/21/20  7:26 AM  Result Value Ref Range   Glucose-Capillary 121 (H) 70 - 99 mg/dL    Comment: Glucose reference range applies only to samples taken after fasting for at least 8 hours.    Medications:  Current Facility-Administered Medications  Medication Dose Route Frequency Provider Last Rate Last Admin  . 0.9 %  sodium chloride infusion   Intravenous PRN Spero Geralds, MD      . acetaminophen (TYLENOL) tablet 650 mg  650 mg Oral Q6H PRN Wendee Beavers T, MD      . chlorhexidine gluconate (MEDLINE KIT) (PERIDEX) 0.12 % solution 15 mL  15 mL Mouth Rinse BID Wilford Corner, MD   15 mL at 04/21/20 0903  . docusate sodium (COLACE) capsule 100 mg  100 mg Oral BID PRN Wilford Corner, MD      . feeding supplement (ENSURE ENLIVE) (ENSURE ENLIVE) liquid 237 mL  237 mL Oral QID Wendee Beavers T, MD   237 mL at 04/21/20 0903  . gelatin adsorbable (GELFOAM/SURGIFOAM) sponge 12-7 mm    PRN Arne Cleveland, MD   1 each at 03/07/20 1952  . guaiFENesin-dextromethorphan (ROBITUSSIN DM) 100-10 MG/5ML syrup 10 mL  10 mL Oral Q4H PRN Wendee Beavers T, MD      . haloperidol lactate (HALDOL) injection 2 mg  2 mg Intravenous Q6H PRN Caren Griffins, MD   2 mg at 04/20/20 2225  . hydrALAZINE (APRESOLINE) injection 5 mg  5 mg Intravenous Q8H PRN Hosie Poisson, MD      . ipratropium-albuterol (DUONEB) 0.5-2.5 (3) MG/3ML nebulizer solution 3 mL  3 mL Nebulization Q6H PRN Chesley Mires, MD      . LORazepam (ATIVAN) injection 1 mg  1 mg Intravenous Q6H PRN Regalado, Belkys A, MD   1 mg at 04/20/20 2030  . LORazepam (ATIVAN) tablet 1 mg  1 mg Oral Q6H PRN Regalado, Belkys A, MD      .  oxyCODONE (Oxy  IR/ROXICODONE) immediate release tablet 5 mg  5 mg Oral Q6H PRN Wendee Beavers T, MD   5 mg at 04/20/20 2225  . pantoprazole (PROTONIX) EC tablet 40 mg  40 mg Oral BID Wendee Beavers T, MD   40 mg at 04/21/20 0902  . QUEtiapine (SEROQUEL) tablet 50 mg  50 mg Oral BID Regalado, Belkys A, MD   50 mg at 04/21/20 0902  . sodium chloride flush (NS) 0.9 % injection 10-40 mL  10-40 mL Intracatheter PRN Collene Gobble, MD   10 mL at 04/15/20 2126    Musculoskeletal: Strength & Muscle Tone: within normal limits Gait & Station: normal Patient leans: N/A  Psychiatric Specialty Exam: Physical Exam  Review of Systems  Blood pressure 116/86, pulse (!) 108, temperature 97.8 F (36.6 C), resp. rate 19, height 6' 2"  (1.88 m), weight 70.3 kg, SpO2 96 %.Body mass index is 19.9 kg/m.  General Appearance: Fairly Groomed  Eye Contact:  Fair  Speech:  Clear and Coherent and Slow  Volume:  Decreased  Mood:  Euthymic  Affect:  Congruent  Thought Process:  Coherent, Linear and Descriptions of Associations: Intact  Orientation:  Full (Time, Place, and Person)  Thought Content:  Logical  Suicidal Thoughts:  No  Homicidal Thoughts:  No  Memory:  Immediate;   Fair Recent;   Poor Remote;   Fair  Judgement:  Intact  Insight:  Shallow  Psychomotor Activity:  Normal  Concentration:  Concentration: Fair and Attention Span: Fair  Recall:  [employer] of Knowledge:  Fair  Language:  Fair  Akathisia:  No  Handed:  Right  AIMS (if indicated):     Assets:  Communication Skills Desire for Improvement Financial Resources/Insurance Housing Leisure Time  ADL's:  Impaired  Cognition:  WNL  Sleep:        Treatment Plan Summary: Plan Will psych clear at this time. Patient with emotional disturbances, mood, and agiation s/p multiple brain injuries. These findigns are quite common. Patient denies any depressive symptoms or anxiety, despite his current medical conditions. Patient reports some agitation due to his  current condition. He states it was a for a fe days however now feels fine. As per safety sitter, he still has a soft belt restraint in place, and safety mitts have been removed at this time. Soft belt remains in place for fall risks only. Will add low dose mood stabilizer to help with agitation, confusion, and aggressive behavior.  WIll order EKG as most recent one was over 30 days ago and patient in hospital due to MI, and QTc of 504. WIll limit use of antipsychotics at this time due to reasons listed above. Patient on seroquel and haldol prior to consult of which both agents may further worsen qtc prolongation. If antipsychotics are needed recommend olanzapine or risperdal prn use.  Order place for trileptal 56m po BID. WIll start low dose fluoxetine 147mpo qhs to help with aggressive behaviors that may be s/p anoxic brain injury and cerebral infarcts.   Disposition: No evidence of imminent risk to self or others at present.   Patient does not meet criteria for psychiatric inpatient admission. Supportive therapy provided about ongoing stressors. Discussed crisis plan, support from social network, calling 911, coming to the Emergency Department, and calling Suicide Hotline.  This service was provided via telemedicine using a 2-way, interactive audio and video technology.  Names of all persons participating in this telemedicine service and their role in this encounter.  Name: Sheran Fava Role: PMHNP-BC  Name: Kenneth Sullivan Role: Patient    Suella Broad, [place] 04/21/2020 10:53 AM

## 2020-04-21 NOTE — Progress Notes (Signed)
PROGRESS NOTE    Kenneth Sullivan  FVC:[id number] DOB: 06-12-1956 DOA: 03/07/2020 PCP: Medicine, [name]   Brief Narrative: 64 year old with unknown medical history, who has not seen a physician for several years prior to admission. He presented to the ED on 3/23 after an acute onset of shortness of breath with oxygen saturation in the 40% measure by EMS. Onsite, he had witnessed bradycardic event leading to asystolic/cardiac arrest. ROSC achieved in 20 minutes with 3 rounds of epinephrine.  Patient was brought to the ED intubated. Admitted to the ICU. CTPA showed massive PE/DVT and right ventricular strain. Underwent IR guided mechanical thrombectomy and IVC filter. Post procedure, while on heparin drip patient started having maroon-colored red stool and bloody drainage from OGT. GI did endoscopy which showed oozing  gastric ulcer with clots. IR did GDA embolization.  Patient remained encephalopathic. On 3/27, MRI of the brain showed multifocal acute to subacute ischemic infarct involving bilateral cerebral hemisphere consistent with global hypoperfusion related to cardiac arrest, also associated with scatter petechial hemorrhage with evidence of hemorrhagic conversion in the parietal lobe. On 4/5, underwent tracheostomy. Patient had dysphagia, he was a started on dysphagia 2 diet. He had poor appetite. His appetite has improved and he is not longer requiring tube feedings. No need for PEG tube at this time.    Assessment & Plan:   Principal Problem:   Cardiac arrest [facility]) Active Problems:   Encounter for central line placement   GI bleed   Acute respiratory failure ([facility])   Anoxic encephalopathy ([facility])   Cerebral embolism with cerebral infarction   Pulmonary emboli ([place])   Palliative care by specialist   Goals of care, counseling/discussion   DNR (do not resuscitate)   Status post tracheostomy ([place])  1-Massive pulmonary embolism leading to sudden cardiac arrest, leading  to anoxic brain injury: -Status post IR guided mechanical thrombectomy, IVC filter. -Not on anticoagulation because of associated GI bleed and brain bleed.  2-Acute respiratory failure with hypoxia due to massive PE -Decannulated on 4/28. PCCM signed off. Recommend referral to ENT if no stoma closure in 4 weeks. -Currently on RA.   3-Acute blood loss anemia due to acute GI bleed: Patient is started to have melena and fresh blood in OGT post thrombectomy and heparinization. Endoscopy show acute GI bleed secondary to large gastric ulcer. IR performed  GDA embolization. Continue with PPI. Hemoglobin remained stable.  4-Acute encephalopathy due to anoxic brain injury:; Patient is awake and alert but only oriented to self. Visual Hallucination and sedation agitation at times. On Seroquel 25 mg twice daily and Haldol as needed. Psych consulted.  Delirium precaution Worsening agitation, increase Seroquel 5-6, change ativan to IV. Psych consulted.   5-Dysphagia;  Upgraded to dysphagia 3 diet by speech. He is now able to meet his caloric needs.  6-leukocytosis: Likely demargination from steroid. He was weaned off of the steroid. Resolved  Debility physical deconditioning: PT OT recommending a skilled nursing facility  Nutrition Problem: Inadequate oral intake Etiology: lethargy/confusion    Signs/Symptoms: meal completion < 25%    Interventions: Ensure Enlive (each supplement provides 350kcal and 20 grams of protein), Prostat, Tube feeding  Estimated body mass index is 19.9 kg/m as calculated from the following:   Height as of this encounter: 6' 2"  (1.88 m).   Weight as of this encounter: 70.3 kg.   DVT prophylaxis:  Code Status:  Family Communication: Disposition Plan:  Patient is from: Home Anticipated d/c date: Stable for discharge when  bed available at  facility Anticipated to be discharged to skilled nursing facility Barriers to d/c or necessity for inpatient status:  Medical stable for discharge   Consultants:   CCM, neurology, IR,  Procedures:  ETT 3/23 >>4/5 Trach 4/5 >>changed to 6 cuffless 4/22 >> L TLC 3/23 >> out ALine L Fem 3/23 >> 3/30  Antimicrobials:  None.  Microbiology:  Bcx 3/23 >> negative [name] 3/23 >> negative BCx2 3/23 >>negative   Subjective: Didn't sleep last night. He is alert, gets restless. Fell twice yesterday.    Objective: Vitals:   04/20/20 0723 04/20/20 1654 04/20/20 2226 04/21/20 0849  BP: 105/78 (!) 133/93 138/87 116/86  Pulse: 94 (!) 116 (!) 126 (!) 108  Resp:   18 19  Temp: 98.2 F (36.8 C) 98.2 F (36.8 C) 98.4 F (36.9 C) 97.8 F (36.6 C)  TempSrc:   Oral   SpO2: 97% 99% 99% 96%  Weight:      Height:        Intake/Output Summary (Last 24 hours) at 04/21/2020 1423 Last data filed at 04/21/2020 1255 Gross per 24 hour  Intake 877 ml  Output 475 ml  Net 402 ml   Filed Weights   03/23/20 0342 03/25/20 0500 03/27/20 0500  Weight: 77.8 kg 77.7 kg 70.3 kg    Examination:  General exam: NAD Respiratory system: CTA Cardiovascular system: S 1, S 2 RRR Gastrointestinal system: BS present, soft, nt Central nervous system: Alert, confuse Extremities: Symmetric power.   Data Reviewed: I have personally reviewed following labs and imaging studies  CBC: Recent Labs  Lab 04/16/20 0841 04/19/20 0808  WBC 7.4 8.0  HGB 11.0* 11.5*  HCT 33.5* 35.6*  MCV 93.6 94.9  PLT 187 517   Basic Metabolic Panel: Recent Labs  Lab 04/14/20 1703 04/15/20 0555 04/15/20 1650 04/16/20 0841 04/19/20 0808  NA  --  139  --  139 137  K  --  4.1  --  4.1 3.7  CL  --  101  --  105 100  CO2  --  27  --  25 23  GLUCOSE  --  99  --  77 108*  BUN  --  21  --  21 21  CREATININE  --  0.79  --  0.81 0.86  CALCIUM  --  9.6  --  9.0 9.0  MG  --  2.2  --  2.0 1.9  PHOS 3.9 4.1 3.7 4.2 3.2   GFR: Estimated Creatinine Clearance: 88.6 mL/min (by C-G formula based on SCr of 0.86 mg/dL). Liver Function  Tests: Recent Labs  Lab 04/15/20 0555 04/16/20 0841 04/19/20 0808  ALBUMIN 3.3* 2.7* 3.0*   No results for input(s): LIPASE, AMYLASE in the last 168 hours. No results for input(s): AMMONIA in the last 168 hours. Coagulation Profile: No results for input(s): INR, PROTIME in the last 168 hours. Cardiac Enzymes: No results for input(s): CKTOTAL, CKMB, CKMBINDEX, TROPONINI in the last 168 hours. BNP (last 3 results) No results for input(s): PROBNP in the last 8760 hours. HbA1C: No results for input(s): HGBA1C in the last 72 hours. CBG: Recent Labs  Lab 04/20/20 0722 04/20/20 1537 04/20/20 1921 04/21/20 0726 04/21/20 1229  GLUCAP 106* 105* 103* 121* 128*   Lipid Profile: No results for input(s): CHOL, HDL, LDLCALC, TRIG, CHOLHDL, LDLDIRECT in the last 72 hours. Thyroid Function Tests: No results for input(s): TSH, T4TOTAL, FREET4, T3FREE, THYROIDAB in the last 72 hours. Anemia Panel: No results for  input(s): VITAMINB12, FOLATE, FERRITIN, TIBC, IRON, RETICCTPCT in the last 72 hours. Sepsis Labs: No results for input(s): PROCALCITON, LATICACIDVEN in the last 168 hours.  No results found for this or any previous visit (from the past 240 hour(s)).       Radiology Studies: No results found.      Scheduled Meds: . chlorhexidine gluconate (MEDLINE KIT)  15 mL Mouth Rinse BID  . feeding supplement (ENSURE ENLIVE)  237 mL Oral QID  . pantoprazole  40 mg Oral BID  . QUEtiapine  50 mg Oral BID   Continuous Infusions: . sodium chloride       LOS: 45 days    Time spent: 35 minutes.     Elmarie Shiley, MD [facility]   If 7PM-7AM, please contact night-coverage [email or web address]  04/21/2020, 2:23 PM

## 2020-04-21 NOTE — Progress Notes (Signed)
Physical Therapy Treatment Patient Details Name: Kenneth Sullivan MRN: [id number] DOB: 1956-04-05 Today's Date: 04/21/2020    History of Present Illness Pt is 64 yo male with unknown medical hx.  He presented to ED on 3/23 with acute SHOB.  He had bradycardiac event that lead to asystole/cardiac arrest (ROSC achieved in 20 mins with 3 rounds epinephrine).  Pt was intubated and admitted to ICU.  Pt with massive PE/DVT and RV strain.  He underwent IR guided mechanical thrombectomy and IVC Filter.   Pt developed gastric ulcer and required GDA embolization.  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 Pt s/p trach on 4/5 and changed to cuffless on 4/22--decannualted 4/28    PT Comments    Pt demonstrated good tolerance for ambulation today, but is unsteady with weaving of gait, demonstrates difficulty navigating hallway requiring PT cuing for form and safety, and requires seated rest breaks x2 due to fatigue. Pt very difficult to redirect and is quite impulsive. Additionally, pt struggles with transitional movements, like return to supine post-ambulation. PT continuing to recommend SNF level of care post-acutely to address safety awareness issues and mobility deficits. Will continue to follow acutely.   Follow Up Recommendations  SNF     Equipment Recommendations  None recommended by PT(defer to post acute)    Recommendations for Other Services       Precautions / Restrictions Precautions Precautions: Fall Restrictions Weight Bearing Restrictions: No    Mobility  Bed Mobility Overal bed mobility: Needs Assistance Bed Mobility: Supine to Sit;Sit to Supine     Supine to sit: Supervision Sit to supine: Mod assist;+2 for physical assistance;+2 for  safety/equipment;HOB elevated   General bed mobility comments: supervision for safety, mod assist +2 for return to supine for LE lifting into bed and trunk lowering. Pt with difficulty sequencing return to supine and states "stop, please stop, I get scared"  Transfers Overall transfer level: Needs assistance Equipment used: None Transfers: Sit to/from Stand Sit to Stand: Min guard         General transfer comment: min guard for safety, hand on gait belt to steady pt as needed. Sit to stand x3, from EOB, chair in hallway x2.  Ambulation/Gait Ambulation/Gait assistance: Min assist Gait Distance (Feet): 60 [phone]) Assistive device: 1 person hand held assist Gait Pattern/deviations: Step-through pattern;Decreased stride length;Staggering right;Staggering left;Narrow base of support Gait velocity: decr   General Gait Details: light assist to steady with HHA and occasional reaching for environment with unsupported UE. Verbal cuing for upright posture, watching for hallway obstacles and other people. Seated rest breaks in hallway x2 due to pt fatigue.   Stairs             Wheelchair Mobility    Modified Rankin (Stroke Patients Only)       Balance Overall balance assessment: Needs assistance Sitting-balance support: No upper extremity supported;Feet supported Sitting balance-Leahy Scale: Fair     Standing balance support: Single extremity supported Standing balance-Leahy Scale: Poor Standing balance comment: reliant on external support of PT, reaching for environment to steady                            Cognition Arousal/Alertness: Awake/alert Behavior During Therapy: Restless;Impulsive Overall Cognitive Status: No family/caregiver present to determine baseline cognitive  functioning Area of Impairment: Orientation;Following commands;Problem solving;Safety/judgement                 Orientation Level: Disoriented to;Place;Time;Situation    Memory: Decreased recall of precautions;Decreased short-term memory Following Commands: Follows one step commands with increased time;Follows one step commands inconsistently Safety/Judgement: Decreased awareness of deficits;Decreased awareness of safety   Problem Solving: Difficulty sequencing;Requires tactile cues;Requires verbal cues General Comments: very impulsive, difficult to redirect from perseveration this day about getting a juice      Exercises      General Comments        Pertinent Vitals/Pain Pain Assessment: Faces Faces Pain Scale: No hurt Pain Intervention(s): Monitored during session    Home Living                      Prior Function            PT Goals (current goals can now be found in the care plan section) Acute Rehab PT Goals Patient Stated Goal: to get a soda PT Goal Formulation: Patient unable to participate in goal setting Time For Goal Achievement: 04/25/20 Potential to Achieve Goals: Fair    Frequency    Min 2X/week      PT Plan Current plan remains appropriate    Co-evaluation              AM-PAC PT "6 Clicks" Mobility   Outcome Measure  Help needed turning from your back to your side while in a flat bed without using bedrails?: A Little Help needed moving from lying on your back to sitting on the side of a flat bed without using bedrails?: A Lot Help needed moving to and from a bed to a chair (including a wheelchair)?: A Little Help needed standing up from a chair using your arms (e.g., wheelchair or bedside chair)?: A Little Help needed to walk in hospital room?: A Little Help needed climbing 3-5 steps with a railing? : A Lot 6 Click Score: 16    End of Session Equipment Utilized During Treatment: Gait belt Activity Tolerance: Patient tolerated treatment well Patient left: with call bell/phone within reach;with bed alarm set;in bed Nurse Communication: Mobility status PT Visit Diagnosis: Unsteadiness on feet  (R26.81);Other abnormalities of gait and mobility (R26.89)     Time: [id number] PT Time Calculation (min) (ACUTE ONLY): 15 min  Charges:  $Gait Training: 8-22 mins                    Agueda Houpt E, PT [place] Pager [phone]  Office [place] 04/21/2020, 3:28 PM

## 2020-04-22 LAB — BASIC METABOLIC PANEL
Anion gap: 11 (ref 5–15)
BUN: 15 mg/dL (ref 8–23)
CO2: 24 mmol/L (ref 22–32)
Calcium: 8.8 mg/dL — ABNORMAL LOW (ref 8.9–10.3)
Chloride: 105 mmol/L (ref 98–111)
Creatinine, Ser: 0.73 mg/dL (ref 0.61–1.24)
GFR calc Af Amer: >60 mL/min (ref >60)
GFR calc non Af Amer: >60 mL/min (ref >60)
Glucose, Bld: 85 mg/dL (ref 70–99)
Potassium: 4.1 mmol/L (ref 3.5–5.1)
Sodium: 140 mmol/L (ref 135–145)

## 2020-04-22 LAB — GLUCOSE, CAPILLARY
Glucose-Capillary: 87 mg/dL (ref 70–99)
Glucose-Capillary: 106 mg/dL — ABNORMAL HIGH (ref 70–99)
Glucose-Capillary: 93 mg/dL (ref 70–99)
Glucose-Capillary: 130 mg/dL — ABNORMAL HIGH (ref 70–99)

## 2020-04-22 LAB — CBC
HCT: 30.4 % — ABNORMAL LOW (ref 39.0–52.0)
Hemoglobin: 9.7 g/dL — ABNORMAL LOW (ref 13.0–17.0)
MCH: 30.1 pg (ref 26.0–34.0)
MCHC: 31.9 g/dL (ref 30.0–36.0)
MCV: 94.4 fL (ref 80.0–100.0)
Platelets: 197 10*3/uL (ref 150–400)
RBC: 3.22 MIL/uL — ABNORMAL LOW (ref 4.22–5.81)
RDW: 18.3 % — ABNORMAL HIGH (ref 11.5–15.5)
WBC: 6.8 10*3/uL (ref 4.0–10.5)
nRBC: 0 % (ref 0.0–0.2)

## 2020-04-22 LAB — MAGNESIUM: Magnesium: 1.8 mg/dL (ref 1.7–2.4)

## 2020-04-22 LAB — AMMONIA: Ammonia: 37 umol/L — ABNORMAL HIGH (ref 9–35)

## 2020-04-22 MED ORDER — LORAZEPAM 2 MG/ML IJ SOLN
1.0000 mg | Freq: Once | INTRAMUSCULAR | Status: AC
  Administered 2020-04-22: 1 mg via INTRAMUSCULAR
  Filled 2020-04-22: qty 1

## 2020-04-22 MED ORDER — MAGNESIUM SULFATE 2 GM/50ML IV SOLN
2.0000 g | Freq: Once | INTRAVENOUS | Status: AC
  Administered 2020-04-22: 2 g via INTRAVENOUS
  Filled 2020-04-22: qty 50

## 2020-04-22 NOTE — Progress Notes (Signed)
PROGRESS NOTE    Kenneth Sullivan  ACZ:[id number] DOB: 10-23-1956 DOA: 03/07/2020 PCP: Medicine, [name]   Brief Narrative: 64 year old with unknown medical history, who has not seen a physician for several years prior to admission. He presented to the ED on 3/23 after an acute onset of shortness of breath with oxygen saturation in the 40% measure by EMS. Onsite, he had witnessed bradycardic event leading to asystolic/cardiac arrest. ROSC achieved in 20 minutes with 3 rounds of epinephrine.  Patient was brought to the ED intubated. Admitted to the ICU. CTPA showed massive PE/DVT and right ventricular strain. Underwent IR guided mechanical thrombectomy and IVC filter. Post procedure, while on heparin drip patient started having maroon-colored red stool and bloody drainage from OGT. GI did endoscopy which showed oozing  gastric ulcer with clots. IR did GDA embolization.  Patient remained encephalopathic. On 3/27, MRI of the brain showed multifocal acute to subacute ischemic infarct involving bilateral cerebral hemisphere consistent with global hypoperfusion related to cardiac arrest, also associated with scatter petechial hemorrhage with evidence of hemorrhagic conversion in the parietal lobe. On 4/5, underwent tracheostomy. Patient had dysphagia, he was a started on dysphagia 2 diet. He had poor appetite. His appetite has improved and he is not longer requiring tube feedings. No need for PEG tube at this time.    Assessment & Plan:   Principal Problem:   Cardiac arrest [facility]) Active Problems:   Encounter for central line placement   GI bleed   Acute respiratory failure (HCC)   Anoxic encephalopathy (HCC)   Cerebral embolism with cerebral infarction   Pulmonary emboli ([place])   Palliative care by specialist   Goals of care, counseling/discussion   DNR (do not resuscitate)   Status post tracheostomy ([place])  1-Massive pulmonary embolism leading to sudden cardiac arrest, leading  to anoxic brain injury: -Status post IR guided mechanical thrombectomy, IVC filter. -Not on anticoagulation because of associated GI bleed and brain bleed.  2-Acute respiratory failure with hypoxia due to massive PE -Decannulated on 4/28. PCCM signed off. Recommend referral to ENT if no stoma closure in 4 weeks. -Currently on RA.   3-Acute blood loss anemia due to acute GI bleed: Patient is started to have melena and fresh blood in OGT post thrombectomy and heparinization. Endoscopy show acute GI bleed secondary to large gastric ulcer. IR performed  GDA embolization. Continue with PPI. Hemoglobin remained stable.  4-Acute encephalopathy due to anoxic brain injury:; Patient is awake and alert but only oriented to self. Visual Hallucination and sedation agitation at times. On Seroquel 25 mg twice daily and Haldol as needed. Psych consulted.  Delirium precaution Appreciate psych evaluation. Started on Trileptal, Prozac.  Continue with Ativan. Discontinue Haldol/  Monitor QT   5-Dysphagia;  Upgraded to dysphagia 3 diet by speech. He is now able to meet his caloric needs.  6-leukocytosis: Likely demargination from steroid. He was weaned off of the steroid. Resolved  Debility physical deconditioning: PT OT recommending a skilled nursing facility  Nutrition Problem: Inadequate oral intake Etiology: lethargy/confusion    Signs/Symptoms: meal completion < 25%    Interventions: Ensure Enlive (each supplement provides 350kcal and 20 grams of protein), Prostat, Tube feeding  Estimated body mass index is 19.9 kg/m as calculated from the following:   Height as of this encounter: 6' 2"  (1.88 m).   Weight as of this encounter: 70.3 kg.   DVT prophylaxis: SCD Code Status: DNR Family Communication: No family at bedside Disposition Plan:  Patient is  from: Home Anticipated d/c date: Stable for discharge when bed available at  facility Anticipated to be discharged to skilled nursing  facility Barriers to d/c or necessity for inpatient status: Medical stable for discharge   Consultants:   CCM, neurology, IR,  Procedures:  ETT 3/23 >>4/5 Trach 4/5 >>changed to 6 cuffless 4/22 >> L TLC 3/23 >> out ALine L Fem 3/23 >> 3/30  Antimicrobials:  None.  Microbiology:  Bcx 3/23 >> negative [name] 3/23 >> negative BCx2 3/23 >>negative   Subjective: Still agitated.  Asking to be move to another room    Objective: Vitals:   04/21/20 0849 04/21/20 1722 04/21/20 2321 04/22/20 0824  BP: 116/86 116/82 117/84 (!) 115/93  Pulse: (!) 108 (!) 110 (!) 106 (!) 117  Resp: 19 16 16 19   Temp: 97.8 F (36.6 C) 98.9 F (37.2 C) 98.3 F (36.8 C) 98.1 F (36.7 C)  TempSrc:  Oral Oral   SpO2: 96% 96% 96% 94%  Weight:      Height:        Intake/Output Summary (Last 24 hours) at 04/22/2020 1141 Last data filed at 04/21/2020 1824 Gross per 24 hour  Intake 653 ml  Output --  Net 653 ml   Filed Weights   03/23/20 0342 03/25/20 0500 03/27/20 0500  Weight: 77.8 kg 77.7 kg 70.3 kg    Examination:  General exam: NAD Respiratory system: CTA Cardiovascular system: S 1, S 2 RRR Gastrointestinal system: BS present, soft, nt Central nervous system: Alert, confuse Extremities: Symmetric power.   Data Reviewed: I have personally reviewed following labs and imaging studies  CBC: Recent Labs  Lab 04/16/20 0841 04/19/20 0808 04/22/20 0330  WBC 7.4 8.0 6.8  HGB 11.0* 11.5* 9.7*  HCT 33.5* 35.6* 30.4*  MCV 93.6 94.9 94.4  PLT 187 193 073   Basic Metabolic Panel: Recent Labs  Lab 04/15/20 1650 04/16/20 0841 04/19/20 0808 04/22/20 0330  NA  --  139 137 140  K  --  4.1 3.7 4.1  CL  --  105 100 105  CO2  --  25 23 24   GLUCOSE  --  77 108* 85  BUN  --  21 21 15   CREATININE  --  0.81 0.86 0.73  CALCIUM  --  9.0 9.0 8.8*  MG  --  2.0 1.9  --   PHOS 3.7 4.2 3.2  --    GFR: Estimated Creatinine Clearance: 95.2 mL/min (by C-G formula based on SCr of 0.73  mg/dL). Liver Function Tests: Recent Labs  Lab 04/16/20 0841 04/19/20 0808  ALBUMIN 2.7* 3.0*   No results for input(s): LIPASE, AMYLASE in the last 168 hours. Recent Labs  Lab 04/22/20 0330  AMMONIA 37*   Coagulation Profile: No results for input(s): INR, PROTIME in the last 168 hours. Cardiac Enzymes: No results for input(s): CKTOTAL, CKMB, CKMBINDEX, TROPONINI in the last 168 hours. BNP (last 3 results) No results for input(s): PROBNP in the last 8760 hours. HbA1C: No results for input(s): HGBA1C in the last 72 hours. CBG: Recent Labs  Lab 04/21/20 0726 04/21/20 1229 04/21/20 1716 04/21/20 2051 04/22/20 0809  GLUCAP 121* 128* 82 110* 87   Lipid Profile: No results for input(s): CHOL, HDL, LDLCALC, TRIG, CHOLHDL, LDLDIRECT in the last 72 hours. Thyroid Function Tests: No results for input(s): TSH, T4TOTAL, FREET4, T3FREE, THYROIDAB in the last 72 hours. Anemia Panel: No results for input(s): VITAMINB12, FOLATE, FERRITIN, TIBC, IRON, RETICCTPCT in the last 72 hours. Sepsis Labs:  No results for input(s): PROCALCITON, LATICACIDVEN in the last 168 hours.  No results found for this or any previous visit (from the past 240 hour(s)).       Radiology Studies: No results found.      Scheduled Meds: . chlorhexidine gluconate (MEDLINE KIT)  15 mL Mouth Rinse BID  . feeding supplement (ENSURE ENLIVE)  237 mL Oral QID  . FLUoxetine  10 mg Oral QHS  . OXcarbazepine  75 mg Oral BID  . pantoprazole  40 mg Oral BID  . QUEtiapine  50 mg Oral BID   Continuous Infusions: . sodium chloride       LOS: 46 days    Time spent: 35 minutes.     Elmarie Shiley, MD [facility]   If 7PM-7AM, please contact night-coverage [email or web address]  04/22/2020, 11:41 AM

## 2020-04-23 LAB — GLUCOSE, CAPILLARY
Glucose-Capillary: 100 mg/dL — ABNORMAL HIGH (ref 70–99)
Glucose-Capillary: 92 mg/dL (ref 70–99)
Glucose-Capillary: 88 mg/dL (ref 70–99)
Glucose-Capillary: 108 mg/dL — ABNORMAL HIGH (ref 70–99)

## 2020-04-23 MED ORDER — LORAZEPAM 2 MG/ML IJ SOLN
1.0000 mg | Freq: Four times a day (QID) | INTRAMUSCULAR | Status: DC | PRN
  Administered 2020-04-23 – 2020-04-24 (×2): 2 mg via INTRAVENOUS
  Administered 2020-04-28: 1 mg via INTRAVENOUS
  Administered 2020-04-30 – 2020-05-15 (×5): 2 mg via INTRAVENOUS
  Filled 2020-04-23 (×12): qty 1

## 2020-04-23 MED ORDER — LORAZEPAM 1 MG PO TABS
2.0000 mg | ORAL_TABLET | Freq: Four times a day (QID) | ORAL | Status: DC | PRN
  Administered 2020-04-23 – 2020-05-15 (×31): 2 mg via ORAL
  Filled 2020-04-23 (×32): qty 2

## 2020-04-23 NOTE — Progress Notes (Signed)
PROGRESS NOTE    Kenneth Sullivan  UJW:[id number] DOB: [birth year] DOA: 03/07/2020 PCP: Medicine, [name]   Brief Narrative: 64 year old with unknown medical history, who has not seen a physician for several years prior to admission. He presented to the ED on 3/23 after an acute onset of shortness of breath with oxygen saturation in the 40% measure by EMS. Onsite, he had witnessed bradycardic event leading to asystolic/cardiac arrest. ROSC achieved in 20 minutes with 3 rounds of epinephrine.  Patient was brought to the ED intubated. Admitted to the ICU. CTPA showed massive PE/DVT and right ventricular strain. Underwent IR guided mechanical thrombectomy and IVC filter. Post procedure, while on heparin drip patient started having maroon-colored red stool and bloody drainage from OGT. GI did endoscopy which showed oozing  gastric ulcer with clots. IR did GDA embolization.  Patient remained encephalopathic. On 3/27, MRI of the brain showed multifocal acute to subacute ischemic infarct involving bilateral cerebral hemisphere consistent with global hypoperfusion related to cardiac arrest, also associated with scatter petechial hemorrhage with evidence of hemorrhagic conversion in the parietal lobe. On 4/5, underwent tracheostomy. Patient had dysphagia, he was a started on dysphagia 2 diet. He had poor appetite. His appetite has improved and he is not longer requiring tube feedings. No need for PEG tube at this time.    Assessment & Plan:   Principal Problem:   Cardiac arrest [facility]) Active Problems:   Encounter for central line placement   GI bleed   Acute respiratory failure (HCC)   Anoxic encephalopathy (HCC)   Cerebral embolism with cerebral infarction   Pulmonary emboli ([place])   Palliative care by specialist   Goals of care, counseling/discussion   DNR (do not resuscitate)   Status post tracheostomy ([place])  1-Massive pulmonary embolism leading to sudden cardiac arrest, leading  to anoxic brain injury: -Status post IR guided mechanical thrombectomy, IVC filter. -Not on anticoagulation because of associated GI bleed and brain bleed.  2-Acute respiratory failure with hypoxia due to massive PE -Decannulated on 4/28. PCCM signed off. Recommend referral to ENT if no stoma closure in 4 weeks. -Currently on RA.   3-Acute blood loss anemia due to acute GI bleed: Patient is started to have melena and fresh blood in OGT post thrombectomy and heparinization. Endoscopy show acute GI bleed secondary to large gastric ulcer. IR performed  GDA embolization. Continue with PPI. Hemoglobin remained stable.  4-Acute encephalopathy due to anoxic brain injury:; Patient is awake and alert but only oriented to self. Visual Hallucination and sedation agitation at times. On Seroquel 25 mg twice daily and Haldol as needed. Psych consulted.  Delirium precaution Appreciate psych evaluation. Started on Trileptal, Prozac.  Continue with Ativan. Discontinue Haldol/  Monitor QT , replace mg  5-Dysphagia;  Upgraded to dysphagia 3 diet by speech. He is now able to meet his caloric needs.  6-leukocytosis: Likely demargination from steroid. He was weaned off of the steroid. Resolved  Debility physical deconditioning: PT OT recommending a skilled nursing facility  Nutrition Problem: Inadequate oral intake Etiology: lethargy/confusion    Signs/Symptoms: meal completion < 25%    Interventions: Ensure Enlive (each supplement provides 350kcal and 20 grams of protein), Prostat, Tube feeding  Estimated body mass index is 19.9 kg/m as calculated from the following:   Height as of this encounter: 6' 2"  (1.88 m).   Weight as of this encounter: 70.3 kg.   DVT prophylaxis: SCD Code Status: DNR Family Communication: No family at bedside Disposition Plan:  Patient is from: Home Anticipated d/c date: Stable for discharge when bed available at  facility Anticipated to be discharged to  skilled nursing facility Barriers to d/c or necessity for inpatient status: Medical stable for discharge   Consultants:   CCM, neurology, IR,  Procedures:  ETT 3/23 >>4/5 Trach 4/5 >>changed to 6 cuffless 4/22 >> L TLC 3/23 >> out ALine L Fem 3/23 >> 3/30  Antimicrobials:  None.  Microbiology:  Bcx 3/23 >> negative [name] 3/23 >> negative BCx2 3/23 >>negative   Subjective: Still reqiring restrain due to high risk for fall.  Denies chest pain  Or dyspnea.  He said "mami let me go"  Objective: Vitals:   04/22/20 0824 04/22/20 1654 04/22/20 2004 04/23/20 0015  BP: (!) 115/93 114/88 113/83 128/74  Pulse: (!) 117 (!) 121 (!) 126 (!) 110  Resp: 19 19 17 18   Temp: 98.1 F (36.7 C) 98.6 F (37 C) 98.3 F (36.8 C) 98.2 F (36.8 C)  TempSrc:    (P) Oral  SpO2: 94% 95% 95% 93%  Weight:      Height:       No intake or output data in the 24 hours ending 04/23/20 0733 Filed Weights   03/23/20 0342 03/25/20 0500 03/27/20 0500  Weight: 77.8 kg 77.7 kg 70.3 kg    Examination:  General exam: NAD Respiratory system: CTA Cardiovascular system: S 1, S 2 RRR Gastrointestinal system: BS present, soft nt Central nervous system: Alert, confuse Extremities: Symmetric power.   Data Reviewed: I have personally reviewed following labs and imaging studies  CBC: Recent Labs  Lab 04/16/20 0841 04/19/20 0808 04/22/20 0330  WBC 7.4 8.0 6.8  HGB 11.0* 11.5* 9.7*  HCT 33.5* 35.6* 30.4*  MCV 93.6 94.9 94.4  PLT 187 193 921   Basic Metabolic Panel: Recent Labs  Lab 04/16/20 0841 04/19/20 0808 04/22/20 0330 04/22/20 1119  NA 139 137 140  --   K 4.1 3.7 4.1  --   CL 105 100 105  --   CO2 25 23 24   --   GLUCOSE 77 108* 85  --   BUN 21 21 15   --   CREATININE 0.81 0.86 0.73  --   CALCIUM 9.0 9.0 8.8*  --   MG 2.0 1.9  --  1.8  PHOS 4.2 3.2  --   --    GFR: Estimated Creatinine Clearance: 95.2 mL/min (by C-G formula based on SCr of 0.73 mg/dL). Liver Function  Tests: Recent Labs  Lab 04/16/20 0841 04/19/20 0808  ALBUMIN 2.7* 3.0*   No results for input(s): LIPASE, AMYLASE in the last 168 hours. Recent Labs  Lab 04/22/20 0330  AMMONIA 37*   Coagulation Profile: No results for input(s): INR, PROTIME in the last 168 hours. Cardiac Enzymes: No results for input(s): CKTOTAL, CKMB, CKMBINDEX, TROPONINI in the last 168 hours. BNP (last 3 results) No results for input(s): PROBNP in the last 8760 hours. HbA1C: No results for input(s): HGBA1C in the last 72 hours. CBG: Recent Labs  Lab 04/21/20 2051 04/22/20 0809 04/22/20 1247 04/22/20 1650 04/22/20 2004  GLUCAP 110* 87 106* 93 130*   Lipid Profile: No results for input(s): CHOL, HDL, LDLCALC, TRIG, CHOLHDL, LDLDIRECT in the last 72 hours. Thyroid Function Tests: No results for input(s): TSH, T4TOTAL, FREET4, T3FREE, THYROIDAB in the last 72 hours. Anemia Panel: No results for input(s): VITAMINB12, FOLATE, FERRITIN, TIBC, IRON, RETICCTPCT in the last 72 hours. Sepsis Labs: No results for input(s): PROCALCITON, LATICACIDVEN  in the last 168 hours.  No results found for this or any previous visit (from the past 240 hour(s)).       Radiology Studies: No results found.      Scheduled Meds: . chlorhexidine gluconate (MEDLINE KIT)  15 mL Mouth Rinse BID  . feeding supplement (ENSURE ENLIVE)  237 mL Oral QID  . FLUoxetine  10 mg Oral QHS  . OXcarbazepine  75 mg Oral BID  . pantoprazole  40 mg Oral BID  . QUEtiapine  50 mg Oral BID   Continuous Infusions: . sodium chloride       LOS: 47 days    Time spent: 35 minutes.     Elmarie Shiley, MD [facility]   If 7PM-7AM, please contact night-coverage [email or web address]  04/23/2020, 7:33 AM

## 2020-04-24 LAB — CBC
HCT: 29.4 % — ABNORMAL LOW (ref 39.0–52.0)
Hemoglobin: 9.7 g/dL — ABNORMAL LOW (ref 13.0–17.0)
MCH: 31.1 pg (ref 26.0–34.0)
MCHC: 33 g/dL (ref 30.0–36.0)
MCV: 94.2 fL (ref 80.0–100.0)
Platelets: 256 10*3/uL (ref 150–400)
RBC: 3.12 MIL/uL — ABNORMAL LOW (ref 4.22–5.81)
RDW: 17.9 % — ABNORMAL HIGH (ref 11.5–15.5)
WBC: 6.5 10*3/uL (ref 4.0–10.5)
nRBC: 0 % (ref 0.0–0.2)

## 2020-04-24 LAB — BASIC METABOLIC PANEL
Anion gap: 9 (ref 5–15)
BUN: 13 mg/dL (ref 8–23)
CO2: 25 mmol/L (ref 22–32)
Calcium: 8.7 mg/dL — ABNORMAL LOW (ref 8.9–10.3)
Chloride: 107 mmol/L (ref 98–111)
Creatinine, Ser: 0.84 mg/dL (ref 0.61–1.24)
GFR calc Af Amer: >60 mL/min (ref >60)
GFR calc non Af Amer: >60 mL/min (ref >60)
Glucose, Bld: 99 mg/dL (ref 70–99)
Potassium: 3.8 mmol/L (ref 3.5–5.1)
Sodium: 141 mmol/L (ref 135–145)

## 2020-04-24 LAB — GLUCOSE, CAPILLARY
Glucose-Capillary: 96 mg/dL (ref 70–99)
Glucose-Capillary: 68 mg/dL — ABNORMAL LOW (ref 70–99)
Glucose-Capillary: 92 mg/dL (ref 70–99)
Glucose-Capillary: 81 mg/dL (ref 70–99)
Glucose-Capillary: 97 mg/dL (ref 70–99)

## 2020-04-24 LAB — MAGNESIUM: Magnesium: 2 mg/dL (ref 1.7–2.4)

## 2020-04-24 MED ORDER — GLUCOSE 4 G PO CHEW
CHEWABLE_TABLET | ORAL | Status: AC
  Administered 2020-04-24: 4 g
  Filled 2020-04-24: qty 1

## 2020-04-24 NOTE — Progress Notes (Signed)
PROGRESS NOTE    Kenneth Sullivan  ZMO:[id number] DOB: 06-Feb-1956 DOA: 03/07/2020 PCP: Medicine, [name]   Brief Narrative: 64 year old with unknown medical history, who has not seen a physician for several years prior to admission. He presented to the ED on 3/23 after an acute onset of shortness of breath with oxygen saturation in the 40% measure by EMS. Onsite, he had witnessed bradycardic event leading to asystolic/cardiac arrest. ROSC achieved in 20 minutes with 3 rounds of epinephrine.  Patient was brought to the ED intubated. Admitted to the ICU. CTPA showed massive PE/DVT and right ventricular strain. Underwent IR guided mechanical thrombectomy and IVC filter. Post procedure, while on heparin drip patient started having maroon-colored red stool and bloody drainage from OGT. GI did endoscopy which showed oozing  gastric ulcer with clots. IR did GDA embolization.  Patient remained encephalopathic. On 3/27, MRI of the brain showed multifocal acute to subacute ischemic infarct involving bilateral cerebral hemisphere consistent with global hypoperfusion related to cardiac arrest, also associated with scatter petechial hemorrhage with evidence of hemorrhagic conversion in the parietal lobe. On 4/5, underwent tracheostomy. Patient had dysphagia, he was a started on dysphagia 2 diet. He had poor appetite. His appetite has improved and he is not longer requiring tube feedings. No need for PEG tube at this time.    Assessment & Plan:   Principal Problem:   Cardiac arrest [facility]) Active Problems:   Encounter for central line placement   GI bleed   Acute respiratory failure (HCC)   Anoxic encephalopathy (HCC)   Cerebral embolism with cerebral infarction   Pulmonary emboli ([employer])   Palliative care by specialist   Goals of care, counseling/discussion   DNR (do not resuscitate)   Status post tracheostomy ([place])  1-Massive pulmonary embolism leading to sudden cardiac arrest, leading  to anoxic brain injury: -Status post IR guided mechanical thrombectomy, IVC filter. -Not on anticoagulation because of associated GI bleed and brain bleed.  2-Acute respiratory failure with hypoxia due to massive PE -Decannulated on 4/28. PCCM signed off. Recommend referral to ENT if no stoma closure in 4 weeks. -Currently on RA.   3-Acute blood loss anemia due to acute GI bleed: Patient is started to have melena and fresh blood in OGT post thrombectomy and heparinization. Endoscopy show acute GI bleed secondary to large gastric ulcer. IR performed  GDA embolization. Continue with PPI. Hemoglobin remained stable.  4-Acute encephalopathy due to anoxic brain injury:; Agitation;  Patient is awake and alert but only oriented to self. Visual Hallucination and sedation agitation at times. On Seroquel 50 mg BID Psych consulted.  Delirium precaution Appreciate psych evaluation. Started on Trileptal, Prozac.  Continue with Ativan. Discontinue Haldol/  Monitor QT , replace mg  5-Dysphagia;  Upgraded to dysphagia 3 diet by speech. He is now able to meet his caloric needs.  6-leukocytosis: Likely demargination from steroid. He was weaned off of the steroid. Resolved  Debility physical deconditioning: PT OT recommending a skilled nursing facility  Nutrition Problem: Inadequate oral intake Etiology: lethargy/confusion    Signs/Symptoms: meal completion < 25%    Interventions: Ensure Enlive (each supplement provides 350kcal and 20 grams of protein), Prostat, Tube feeding  Estimated body mass index is 19.9 kg/m as calculated from the following:   Height as of this encounter: _0  (1.88 m).   Weight as of this encounter: 70.3 kg.   DVT prophylaxis: SCD Code Status: DNR Family Communication: No family at bedside Disposition Plan:  Patient is from:  Home Anticipated d/c date: Stable for discharge when bed available at  facility Anticipated to be discharged to skilled nursing  facility Barriers to d/c or necessity for inpatient status: Medical stable for discharge   Consultants:   CCM, neurology, IR,  Procedures:  ETT 3/23 >>4/5 Trach 4/5 >>changed to 6 cuffless 4/22 >> L TLC 3/23 >> out ALine L Fem 3/23 >> 3/30  Antimicrobials:  None.  Microbiology:  Bcx 3/23 >> negative [name] 3/23 >> negative BCx2 3/23 >>negative   Subjective: Received ativan this am, sleepy   Objective: Vitals:   04/23/20 2149 04/24/20 0408 04/24/20 0808 04/24/20 1222  BP: 104/77 1_0  Pulse: (!) 126 (!) 108 (!) 109 (!) 103  Resp:   20 19  Temp: 97.8 F (36.6 C) 98.3 F (36.8 C) 97.7 F (36.5 C) 97.7 F (36.5 C)  TempSrc: Oral     SpO2: 96% 99% 97% 97%  Weight:      Height:        Intake/Output Summary (Last 24 hours) at 04/24/2020 1324 Last data filed at 04/24/2020 0900 Gross per 24 hour  Intake 360 ml  Output 1 ml  Net 359 ml   Filed Weights   03/23/20 0342 03/25/20 0500 03/27/20 0500  Weight: 77.8 kg 77.7 kg 70.3 kg    Examination:  General exam; NAD Respiratory system: CTA Cardiovascular system: S 1, S 2 RRR Gastrointestinal system: BS present, soft, nt Central nervous system: sleepy  Extremities: symmetric power.   Data Reviewed: I have personally reviewed following labs and imaging studies  CBC: Recent Labs  Lab 04/19/20 0808 04/22/20 0330 04/24/20 0302  WBC 8.0 6.8 6.5  HGB 11.5* 9.7* 9.7*  HCT 35.6* 30.4* 29.4*  MCV 94.9 94.4 94.2  PLT 193 197 073   Basic Metabolic Panel: Recent Labs  Lab 04/19/20 0808 04/22/20 0330 04/22/20 1119 04/24/20 0302  NA 137 140  --  141  K 3.7 4.1  --  3.8  CL 100 105  --  107  CO2 23 24  --  25  GLUCOSE 108* 85  --  99  BUN 21 15  --  13  CREATININE 0.86 0.73  --  0.84  CALCIUM 9.0 8.8*  --  8.7*  MG 1.9  --  1.8 2.0  PHOS 3.2  --   --   --    GFR: Estimated Creatinine Clearance: 90.7 mL/min (by C-G formula based on SCr of 0.84 mg/dL). Liver Function Tests: Recent Labs    Lab 04/19/20 0808  ALBUMIN 3.0*   No results for input(s): LIPASE, AMYLASE in the last 168 hours. Recent Labs  Lab 04/22/20 0330  AMMONIA 37*   Coagulation Profile: No results for input(s): INR, PROTIME in the last 168 hours. Cardiac Enzymes: No results for input(s): CKTOTAL, CKMB, CKMBINDEX, TROPONINI in the last 168 hours. BNP (last 3 results) No results for input(s): PROBNP in the last 8760 hours. HbA1C: No results for input(s): HGBA1C in the last 72 hours. CBG: Recent Labs  Lab 04/23/20 1159 04/23/20 1633 04/23/20 2149 04/24/20 0807 04/24/20 1223  GLUCAP 92 88 108* 96 68*   Lipid Profile: No results for input(s): CHOL, HDL, LDLCALC, TRIG, CHOLHDL, LDLDIRECT in the last 72 hours. Thyroid Function Tests: No results for input(s): TSH, T4TOTAL, FREET4, T3FREE, THYROIDAB in the last 72 hours. Anemia Panel: No results for input(s): VITAMINB12, FOLATE, FERRITIN, TIBC, IRON, RETICCTPCT in the last 72 hours. Sepsis Labs: No results for input(s): PROCALCITON, LATICACIDVEN in  the last 168 hours.  No results found for this or any previous visit (from the past 240 hour(s)).       Radiology Studies: No results found.      Scheduled Meds: . chlorhexidine gluconate (MEDLINE KIT)  15 mL Mouth Rinse BID  . feeding supplement (ENSURE ENLIVE)  237 mL Oral QID  . FLUoxetine  10 mg Oral QHS  . OXcarbazepine  75 mg Oral BID  . pantoprazole  40 mg Oral BID  . QUEtiapine  50 mg Oral BID   Continuous Infusions: . sodium chloride       LOS: 48 days    Time spent: 35 minutes.     Elmarie Shiley, MD [facility]   If 7PM-7AM, please contact night-coverage [email or web address]  04/24/2020, 1:24 PM

## 2020-04-24 NOTE — Progress Notes (Signed)
2200 Pt remains in yellow MEWS, agitated and yelling  for different people that are not present, and trying to get out of bed. 1:1 Sitter at bedside, will cont to monitor.

## 2020-04-25 LAB — GLUCOSE, CAPILLARY
Glucose-Capillary: 79 mg/dL (ref 70–99)
Glucose-Capillary: 85 mg/dL (ref 70–99)
Glucose-Capillary: 94 mg/dL (ref 70–99)
Glucose-Capillary: 75 mg/dL (ref 70–99)
Glucose-Capillary: 84 mg/dL (ref 70–99)

## 2020-04-25 NOTE — Progress Notes (Signed)
PT Cancellation Note  Patient Details Name: Kenneth Sullivan MRN: [id number] DOB: [birth year]   Cancelled Treatment:    Reason Eval/Treat Not Completed: Other (comment)  Checked in am and sitter reporting pt has been up and down frequently to restroom and just now went to sleep - request to let him sleep.  Checked in pm and pt declined PT due to not feeling well today.  Will f/u as able. Maggie Font, PT Acute Rehab Services Pager [phone] [facility] [phone] [facility] [phone]    [name] 04/25/2020, 5:41 PM

## 2020-04-25 NOTE — Progress Notes (Signed)
Occupational Therapy Treatment Patient Details Name: Kenneth Sullivan MRN: [id number] DOB: 01/10/1956 Today's Date: 04/25/2020    History of present illness Pt is 64 yo male with unknown medical hx.  He presented to ED on 3/23 with acute SHOB.  He had bradycardiac event that lead to asystole/cardiac arrest (ROSC achieved in 20 mins with 3 rounds epinephrine).  Pt was intubated and admitted to ICU.  Pt with massive PE/DVT and RV strain.  He underwent IR guided mechanical thrombectomy and IVC Filter.   Pt developed gastric ulcer and required GDA embolization.  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 Pt s/p trach on 4/5 and changed to cuffless on 4/22--decannualted 4/28   OT comments  Patient continues to make steady progress towards goals in skilled OT session. Patient's session encompassed completion of ADLs in order to assess cognitive status with basic tasks. Pt continues to demonstrate significant difficulty with sequencing of tasks and following one step commands, often tearfully stating "Im scared" and requiring tactile assist to complete BADLs. Pt would continue to benefit greatly from continued rehab in order to address functional deficits; will continue to follow acutely.    Follow Up Recommendations  SNF;Supervision/Assistance - 24 hour    Equipment Recommendations  Other (comment)(TBD)    Recommendations for Other Services      Precautions / Restrictions Precautions Precautions: Fall Restrictions Weight Bearing Restrictions: No       Mobility Bed Mobility Overal bed mobility: Needs Assistance Bed Mobility: Supine to Sit;Sit to Supine       Sit to supine: Min assist;+2 for safety/equipment;+2 for physical assistance   General bed  mobility comments: pt continues to remain affected by cognitive status, requring 2 sets of hands for overall safety, with increased time and tactile cues can navigate bed mobilty with min to SBA  Transfers Overall transfer level: Needs assistance Equipment used: 1 person hand held assist;2 person hand held assist Transfers: Sit to/from Stand Sit to Stand: Min assist;+2 physical assistance;+2 safety/equipment         General transfer comment: min A of 2 solely due to increased confusion and safety, could be one person hand held assist if more cognitively intact    Balance Overall balance assessment: Needs assistance Sitting-balance support: No upper extremity supported;Feet supported Sitting balance-Leahy Scale: Fair Sitting balance - Comments: Unable to assess due to restlessness EOB and urge to go to bathroom   Standing balance support: Single extremity supported Standing balance-Leahy Scale: Poor Standing balance comment: reliant on external support of OT, reaching for environment to steady                           ADL either performed or assessed with clinical judgement   ADL Overall ADL's : Needs assistance/impaired Eating/Feeding: Sitting;Moderate assistance Eating/Feeding Details (indicate cue type and reason): Increased difficulty with visual acquity and cognition, tapping the banana provided to him instead of attempting to open it when prompted, requried mod A in order to complete Grooming: Wash/dry hands;Maximal assistance;Total assistance Grooming Details (indicate cue type and reason): Could not locate water or soap, could place hands under running water, however required hand over hand assist for thoroughness in task, especially to dry hands         Upper Body Dressing : Maximal assistance;Total  assistance;Sitting Upper Body Dressing Details (indicate cue type and reason): Due to confusion Lower Body Dressing: Maximal assistance Lower Body Dressing  Details (indicate cue type and reason): threaded both legs, with multi-modal cues was able to pull up Toilet Transfer: Minimal assistance;Ambulation Toilet Transfer Details (indicate cue type and reason): RUE HHA, no AD, increased confusion in transitions requring tactile cues at hips to descend into sitting Toileting- Clothing Manipulation and Hygiene: Total assistance;Maximal assistance Toileting - Clothing Manipulation Details (indicate cue type and reason): Could not locate toilet paper, once handed toilet paper, wiped hands instead of peri area     Functional mobility during ADLs: Minimal assistance;Cueing for safety;Cueing for sequencing General ADL Comments: Min A for actual mobility when completing tasks, however due to cognitive status requires significant reassurance, hand over hand assist, and multi-modal cues to sequence basic ADL tasks and transition with transfers     Vision   Vision Assessment?: Vision impaired- to be further tested in functional context Additional Comments: Unable to determine full exent of visual deficits due to inabilty to track therapist's finger, left inattention noted, however could argue there are further visual deficits   Perception     Praxis      Cognition Arousal/Alertness: Awake/alert Behavior During Therapy: Restless;Impulsive;Anxious Overall Cognitive Status: No family/caregiver present to determine baseline cognitive functioning Area of Impairment: Orientation;Following commands;Problem solving;Safety/judgement;Attention;Memory;Awareness                 Orientation Level: Disoriented to;Place;Time;Situation Current Attention Level: Focused Memory: Decreased recall of precautions;Decreased short-term memory Following Commands: Follows one step commands with increased time;Follows one step commands inconsistently Safety/Judgement: Decreased awareness of deficits;Decreased awareness of safety Awareness: Emergent Problem Solving:  Difficulty sequencing;Requires tactile cues;Requires verbal cues;Decreased initiation;Slow processing General Comments: minimally impulsive in session, kept stating need to use toilet, however having significant dififculty in completing task        Exercises     Shoulder Instructions       General Comments      Pertinent Vitals/ Pain       Pain Assessment: Faces Faces Pain Scale: Hurts a little bit Pain Location: When attempting to have a BM Pain Descriptors / Indicators: Cramping;Discomfort;Grimacing Pain Intervention(s): Limited activity within patient's tolerance;Monitored during session;Repositioned  Home Living                                          Prior Functioning/Environment              Frequency  Min 2X/week        Progress Toward Goals  OT Goals(current goals can now be found in the care plan section)  Progress towards OT goals: Progressing toward goals  Acute Rehab OT Goals Patient Stated Goal: Pt unable OT Goal Formulation: Patient unable to participate in goal setting Time For Goal Achievement: 05/02/20 Potential to Achieve Goals: [place] Discharge plan remains appropriate    Co-evaluation                 AM-PAC OT "6 Clicks" Daily Activity     Outcome Measure   Help from another person eating meals?: A Lot Help from another person taking care of personal grooming?: A Lot Help from another person toileting, which includes using toliet, bedpan, or urinal?: A Lot Help from another person bathing (including washing, rinsing, drying)?: A Lot Help from another person to put on  and taking off regular upper body clothing?: A Lot Help from another person to put on and taking off regular lower body clothing?: A Lot 6 Click Score: 12    End of Session    OT Visit Diagnosis: Unsteadiness on feet (R26.81);Other abnormalities of gait and mobility (R26.89);Muscle weakness (generalized) (M62.81);Low vision, both eyes  (H54.2);Other symptoms and signs involving cognitive function   Activity Tolerance Patient limited by lethargy;Patient limited by fatigue   Patient Left in chair;with call bell/phone within reach;with nursing/sitter in room   Nurse Communication Mobility status        Time: [id number] OT Time Calculation (min): 21 min  Charges: OT General Charges $OT Visit: 1 Visit OT Treatments $Self Care/Home Management : 8-22 mins  Corinne Ports E. Francis, [facility] [place] 04/25/2020, 10:48 AM

## 2020-04-25 NOTE — Progress Notes (Signed)
PROGRESS NOTE    Kenneth Sullivan  QPY:[id number] DOB: [birth year] DOA: 03/07/2020 PCP: Medicine, [name]   Brief Narrative: 64 year old with unknown medical history, who has not seen a physician for several years prior to admission. He presented to the ED on 3/23 after an acute onset of shortness of breath with oxygen saturation in the 40% measure by EMS. Onsite, he had witnessed bradycardic event leading to asystolic/cardiac arrest. ROSC achieved in 20 minutes with 3 rounds of epinephrine.  Patient was brought to the ED intubated. Admitted to the ICU. CTPA showed massive PE/DVT and right ventricular strain. Underwent IR guided mechanical thrombectomy and IVC filter. Post procedure, while on heparin drip patient started having maroon-colored red stool and bloody drainage from OGT. GI did endoscopy which showed oozing  gastric ulcer with clots. IR did GDA embolization.  Patient remained encephalopathic. On 3/27, MRI of the brain showed multifocal acute to subacute ischemic infarct involving bilateral cerebral hemisphere consistent with global hypoperfusion related to cardiac arrest, also associated with scatter petechial hemorrhage with evidence of hemorrhagic conversion in the parietal lobe. On 4/5, underwent tracheostomy. Patient had dysphagia, he was a started on dysphagia 2 diet. He had poor appetite. His appetite has improved and he is not longer requiring tube feedings. No need for PEG tube at this time.  He has been agitated, psych was consulted to help with medications. Patient was started on  Trileptal, Prozac.    Assessment & Plan:   Principal Problem:   Cardiac arrest [facility]) Active Problems:   Encounter for central line placement   GI bleed   Acute respiratory failure (HCC)   Anoxic encephalopathy (HCC)   Cerebral embolism with cerebral infarction   Pulmonary emboli ([place])   Palliative care by specialist   Goals of care, counseling/discussion   DNR (do not  resuscitate)   Status post tracheostomy ([place])  1-Massive pulmonary embolism leading to sudden cardiac arrest, leading to anoxic brain injury: -Status post IR guided mechanical thrombectomy, IVC filter. -Not on anticoagulation because of associated GI bleed and brain bleed.  2-Acute respiratory failure with hypoxia due to massive PE -Decannulated on 4/28. PCCM signed off. Recommend referral to ENT if no stoma closure in 4 weeks. -Currently on RA.   3-Acute blood loss anemia due to acute GI bleed: Patient is started to have melena and fresh blood in OGT post thrombectomy and heparinization. Endoscopy show acute GI bleed secondary to large gastric ulcer. IR performed  GDA embolization. Continue with PPI. Hemoglobin remained stable.  4-Acute encephalopathy due to anoxic brain injury:; Agitation;  Patient is awake and alert but only oriented to self. Visual Hallucination and sedation agitation at times. On Seroquel 50 mg BID Psych consulted.  Delirium precaution Appreciate psych evaluation. Started on Trileptal, Prozac.  Continue with Ativan. Discontinue Haldol/  Monitor QT , replace mg  5-Dysphagia;  Upgraded to dysphagia 3 diet by speech. He is now able to meet his caloric needs.  6-leukocytosis: Likely demargination from steroid. He was weaned off of the steroid. Resolved  Debility physical deconditioning: PT OT recommending a skilled nursing facility  Nutrition Problem: Inadequate oral intake Etiology: lethargy/confusion    Signs/Symptoms: meal completion < 25%    Interventions: Ensure Enlive (each supplement provides 350kcal and 20 grams of protein), Prostat, Tube feeding  Estimated body mass index is 19.9 kg/m as calculated from the following:   Height as of this encounter: 6' 2"  (1.88 m).   Weight as of this encounter: 70.3 kg.  DVT prophylaxis: SCD Code Status: DNR Family Communication: No family at bedside Disposition Plan:  Patient is from:  Home Anticipated d/c date: Stable for discharge when bed available at  facility Anticipated to be discharged to skilled nursing facility Barriers to d/c or necessity for inpatient status: Medical stable for discharge   Consultants:   CCM, neurology, IR,  Procedures:  ETT 3/23 >>4/5 Trach 4/5 >>changed to 6 cuffless 4/22 >> L TLC 3/23 >> out ALine L Fem 3/23 >> 3/30  Antimicrobials:  None.  Microbiology:  Bcx 3/23 >> negative [name] 3/23 >> negative BCx2 3/23 >>negative   Subjective: Alert, confuse.  Has been less agitated. Need sitter.   Objective: Vitals:   04/24/20 1222 04/24/20 1618 04/24/20 2212 04/25/20 0838  BP: 98/74 (!) 87/68 106/80 90/68  Pulse: (!) 103 100 (!) 102 (!) 102  Resp: 19 20 20 20   Temp: 97.7 F (36.5 C) 97.6 F (36.4 C) 97.7 F (36.5 C) 97.7 F (36.5 C)  TempSrc:   Axillary Axillary  SpO2: 97% 95% 100% 95%  Weight:      Height:        Intake/Output Summary (Last 24 hours) at 04/25/2020 1408 Last data filed at 04/25/2020 1134 Gross per 24 hour  Intake 360 ml  Output 325 ml  Net 35 ml   Filed Weights   03/23/20 0342 03/25/20 0500 03/27/20 0500  Weight: 77.8 kg 77.7 kg 70.3 kg    Examination:  General exam; NAD Respiratory system: CTA Cardiovascular system: S 1, S 2 RRR Gastrointestinal system: BS present, soft, nt Central nervous system: alert, sitting recline, eating breakfast, with restrain   Extremities: symmetric power.   Data Reviewed: I have personally reviewed following labs and imaging studies  CBC: Recent Labs  Lab 04/19/20 0808 04/22/20 0330 04/24/20 0302  WBC 8.0 6.8 6.5  HGB 11.5* 9.7* 9.7*  HCT 35.6* 30.4* 29.4*  MCV 94.9 94.4 94.2  PLT 193 197 768   Basic Metabolic Panel: Recent Labs  Lab 04/19/20 0808 04/22/20 0330 04/22/20 1119 04/24/20 0302  NA 137 140  --  141  K 3.7 4.1  --  3.8  CL 100 105  --  107  CO2 23 24  --  25  GLUCOSE 108* 85  --  99  BUN 21 15  --  13  CREATININE 0.86 0.73  --   0.84  CALCIUM 9.0 8.8*  --  8.7*  MG 1.9  --  1.8 2.0  PHOS 3.2  --   --   --    GFR: Estimated Creatinine Clearance: 90.7 mL/min (by C-G formula based on SCr of 0.84 mg/dL). Liver Function Tests: Recent Labs  Lab 04/19/20 0808  ALBUMIN 3.0*   No results for input(s): LIPASE, AMYLASE in the last 168 hours. Recent Labs  Lab 04/22/20 0330  AMMONIA 37*   Coagulation Profile: No results for input(s): INR, PROTIME in the last 168 hours. Cardiac Enzymes: No results for input(s): CKTOTAL, CKMB, CKMBINDEX, TROPONINI in the last 168 hours. BNP (last 3 results) No results for input(s): PROBNP in the last 8760 hours. HbA1C: No results for input(s): HGBA1C in the last 72 hours. CBG: Recent Labs  Lab 04/24/20 1620 04/24/20 2320 04/25/20 0315 04/25/20 0849 04/25/20 1205  GLUCAP 81 97 79 85 94   Lipid Profile: No results for input(s): CHOL, HDL, LDLCALC, TRIG, CHOLHDL, LDLDIRECT in the last 72 hours. Thyroid Function Tests: No results for input(s): TSH, T4TOTAL, FREET4, T3FREE, THYROIDAB in the last  72 hours. Anemia Panel: No results for input(s): VITAMINB12, FOLATE, FERRITIN, TIBC, IRON, RETICCTPCT in the last 72 hours. Sepsis Labs: No results for input(s): PROCALCITON, LATICACIDVEN in the last 168 hours.  No results found for this or any previous visit (from the past 240 hour(s)).       Radiology Studies: No results found.      Scheduled Meds: . chlorhexidine gluconate (MEDLINE KIT)  15 mL Mouth Rinse BID  . feeding supplement (ENSURE ENLIVE)  237 mL Oral QID  . FLUoxetine  10 mg Oral QHS  . OXcarbazepine  75 mg Oral BID  . pantoprazole  40 mg Oral BID  . QUEtiapine  50 mg Oral BID   Continuous Infusions: . sodium chloride       LOS: 49 days    Time spent: 35 minutes.     Elmarie Shiley, MD [facility]   If 7PM-7AM, please contact night-coverage [email or web address]  04/25/2020, 2:08 PM

## 2020-04-25 NOTE — Progress Notes (Signed)
Nutrition Follow-up  DOCUMENTATION CODES:   Not applicable  INTERVENTION:   - Feeding assistance with meals and snacks  - Ensure Enlive po QID, each supplement provides 350 kcal and 20 grams of protein  - Encourage adequate PO intake  NUTRITION DIAGNOSIS:   Inadequate oral intake related to lethargy/confusion as evidenced by meal completion < 25%.  Progressing  GOAL:   Patient will meet greater than or equal to 90% of their needs  Progressing  MONITOR:   PO intake, Supplement acceptance, Diet advancement, Labs, Weight trends, TF tolerance  REASON FOR ASSESSMENT:   Ventilator    ASSESSMENT:   64 yo male admitted S/P cardiac arrest, S/P normothermia protocol. Found to have massive PE, DVT, GI bleed from gastric ulcer. PMH includes current smoker, heavy alcohol use.  4/02 - trach  4/09 - Cortrak placed  4/11- pt pulled out trach, replaced by RT,TF held due to Cortrak leaking  4/13 - MBS, Dysphagia 1 with thin liquids started 4/14 - Cortrak removed 4/16 - Cortrak replaced 4/22 - Cortrak clogged, replaced by diagnostic radiology (tip in pre-pyloric region of stomach) 4/25 - diet advanced to Dysphagia 2 4/27 - tube feeds changed to nocturnal 4/28 - decannulated 4/30 - pulled Cortrak, TF stopped 5/03 - diet upgraded to Dysphagia 3 5/05 - diet upgraded to Regular  Unable to speak with pt today. Pt in room working with NT and appeared frustrated and confused. Pt accepting most Ensure Enlive supplements per MAR.  Meal Completion: 25-100% x last 8 meals (averaging 62%)  Medications reviewed and include: Ensure Enlive QID, protonix  Labs reviewed: hemoglobin 9.7 CBG's: 68-97 x 24 hours  Diet Order:   Diet Order            Diet regular Room service appropriate? Yes; Fluid consistency: Thin  Diet effective now              EDUCATION NEEDS:   Not appropriate for education at this time  Skin:  Skin Assessment: Skin Integrity Issues: Other: puncture  neck, groin  Last BM:  04/21/20  Height:   Ht Readings from Last 1 Encounters:  03/07/20 6\' 2"  (1.88 m)    Weight:   Wt Readings from Last 1 Encounters:  03/27/20 70.3 kg    Ideal Body Weight:  86.4 kg  BMI:  Body mass index is 19.9 kg/m.  Estimated Nutritional Needs:   Kcal:  2100-2300  Protein:  110-125 gm  Fluid:  >/= 2 L    Gaynell Face, MS, RD, LDN Inpatient Clinical Dietitian Pager: [phone] Weekend/After Hours: [phone]

## 2020-04-26 LAB — BASIC METABOLIC PANEL
Anion gap: 10 (ref 5–15)
BUN: 14 mg/dL (ref 8–23)
CO2: 24 mmol/L (ref 22–32)
Calcium: 8.3 mg/dL — ABNORMAL LOW (ref 8.9–10.3)
Chloride: 107 mmol/L (ref 98–111)
Creatinine, Ser: 0.89 mg/dL (ref 0.61–1.24)
GFR calc Af Amer: >60 mL/min (ref >60)
GFR calc non Af Amer: >60 mL/min (ref >60)
Glucose, Bld: 93 mg/dL (ref 70–99)
Potassium: 4 mmol/L (ref 3.5–5.1)
Sodium: 141 mmol/L (ref 135–145)

## 2020-04-26 LAB — CBC
HCT: 27.3 % — ABNORMAL LOW (ref 39.0–52.0)
Hemoglobin: 8.9 g/dL — ABNORMAL LOW (ref 13.0–17.0)
MCH: 30.7 pg (ref 26.0–34.0)
MCHC: 32.6 g/dL (ref 30.0–36.0)
MCV: 94.1 fL (ref 80.0–100.0)
Platelets: 278 10*3/uL (ref 150–400)
RBC: 2.9 MIL/uL — ABNORMAL LOW (ref 4.22–5.81)
RDW: 17.8 % — ABNORMAL HIGH (ref 11.5–15.5)
WBC: 6.4 10*3/uL (ref 4.0–10.5)
nRBC: 0 % (ref 0.0–0.2)

## 2020-04-26 LAB — GLUCOSE, CAPILLARY
Glucose-Capillary: 93 mg/dL (ref 70–99)
Glucose-Capillary: 135 mg/dL — ABNORMAL HIGH (ref 70–99)
Glucose-Capillary: 87 mg/dL (ref 70–99)
Glucose-Capillary: 99 mg/dL (ref 70–99)

## 2020-04-26 LAB — MAGNESIUM: Magnesium: 1.9 mg/dL (ref 1.7–2.4)

## 2020-04-26 NOTE — Progress Notes (Signed)
Physical Therapy Treatment Patient Details Name: Kenneth Sullivan MRN: [id number] DOB: 12-02-1956 Today's Date: 04/26/2020    History of Present Illness Pt is 65 yo male with unknown medical hx.  He presented to ED on 3/23 with acute SHOB.  He had bradycardiac event that lead to asystole/cardiac arrest (ROSC achieved in 20 mins with 3 rounds epinephrine).  Pt was intubated and admitted to ICU.  Pt with massive PE/DVT and RV strain.  He underwent IR guided mechanical thrombectomy and IVC Filter.   Pt developed gastric ulcer and required GDA embolization.  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 Pt s/p trach on 4/5 and changed to cuffless on 4/22--decannualted 4/28    PT Comments    Pt with good tolerance for OOB activity this day when PT able to motivate pt to participate, pt initially perseverating on being too cold to participate. Pt ambulated hallway distance with use of light assist from PT and chair follow, pt with pronounced L sided inattention vs visual impairment this day leading to pt requiring physical guidance away from obstacles located on L. Pt demonstrated moderate sequencing difficulty with toileting this day, requiring PT assist during toileting for sitting and standing, finding toilet paper, and pericare. PT to continue to follow acutely.    Follow Up Recommendations  SNF     Equipment Recommendations  None recommended by PT(defer to post acute)    Recommendations for Other Services       Precautions / Restrictions Precautions Precautions: Fall Restrictions Weight Bearing Restrictions: No    Mobility  Bed Mobility Overal bed mobility: Needs Assistance Bed Mobility: Supine to Sit;Sit to Supine       Sit to supine: Min  assist   General bed mobility comments: min assist for trunk and LE management, suspect difficulty is due to sequencing issue. Pt also anxious when returning to supine  Transfers Overall transfer level: Needs assistance Equipment used: 1 person hand held assist Transfers: Sit to/from Stand Sit to Stand: Min assist         General transfer comment: min assist to initiate power up and steady upon standing, sit to stand x2 from EOB and from toilet. Verbal cuing for hand placement when rising.  Ambulation/Gait Ambulation/Gait assistance: Min assist;+2 safety/equipment Gait Distance (Feet): 125 Feet Assistive device: 1 person hand held assist Gait Pattern/deviations: Step-through pattern;Decreased stride length;Narrow base of support;Drifts right/left;Shuffle;Trunk flexed Gait velocity: decr   General Gait Details: Min assist +2 for chair follow and steadying via HHA, PT corrected LOB x1. Verbal and tactile cuing for avoiding hallway hazards to pt's L (pt states "I can't see" in reference to L side of hallway), hallway navigation, and directing pt in appropraite directions.   Stairs             Wheelchair Mobility    Modified Rankin (Stroke Patients Only)       Balance Overall balance assessment: Needs assistance Sitting-balance support: No upper extremity supported;Feet supported Sitting balance-Leahy Scale: Fair     Standing balance support: Single extremity supported Standing balance-Leahy Scale: Poor Standing balance comment: reliant on external support                            Cognition Arousal/Alertness: Awake/alert Behavior During Therapy: Restless;Impulsive;Anxious Overall Cognitive Status:  No family/caregiver present to determine baseline cognitive functioning Area of Impairment: Orientation;Following commands;Problem solving;Safety/judgement;Attention;Memory;Awareness                 Orientation Level: Disoriented  to;Place;Time;Situation Current Attention Level: Focused Memory: Decreased recall of precautions;Decreased short-term memory Following Commands: Follows one step commands with increased time;Follows one step commands inconsistently Safety/Judgement: Decreased awareness of deficits;Decreased awareness of safety Awareness: Emergent Problem Solving: Difficulty sequencing;Requires tactile cues;Requires verbal cues;Decreased initiation;Slow processing General Comments: impulsive with L inattention vs visual impairment, as pt bumping into objects on the L requiring frequent tactile cuing to correct. Increased time and effort to mobilize with multimodal cuing required.      Exercises      General Comments General comments (skin integrity, edema, etc.): pt with BM on toilet, while sitting on toilet pt with urination of floor and unable to correct      Pertinent Vitals/Pain Pain Assessment: Faces Faces Pain Scale: No hurt Pain Intervention(s): Monitored during session    Home Living                      Prior Function            PT Goals (current goals can now be found in the care plan section) Acute Rehab PT Goals PT Goal Formulation: Patient unable to participate in goal setting Time For Goal Achievement: 05/03/20 Potential to Achieve Goals: Fair Progress towards PT goals: Progressing toward goals    Frequency    Min 2X/week      PT Plan Current plan remains appropriate    Co-evaluation              AM-PAC PT "6 Clicks" Mobility   Outcome Measure  Help needed turning from your back to your side while in a flat bed without using bedrails?: A Little Help needed moving from lying on your back to sitting on the side of a flat bed without using bedrails?: A Lot Help needed moving to and from a bed to a chair (including a wheelchair)?: A Lot Help needed standing up from a chair using your arms (e.g., wheelchair or bedside chair)?: A Little Help needed to walk  in hospital room?: A Little Help needed climbing 3-5 steps with a railing? : A Lot 6 Click Score: 15    End of Session Equipment Utilized During Treatment: Gait belt Activity Tolerance: Patient limited by fatigue;Other (comment)("I'm so cold") Patient left: with call bell/phone within reach;with bed alarm set;in bed;with nursing/sitter in room Nurse Communication: Mobility status PT Visit Diagnosis: Unsteadiness on feet (R26.81);Other abnormalities of gait and mobility (R26.89)     Time: [id number] PT Time Calculation (min) (ACUTE ONLY): 24 min  Charges:  $Gait Training: 8-22 mins $Therapeutic Activity: 8-22 mins                     Giovanie Lefebre E, PT Acute Rehabilitation Services Pager [phone]  Office [phone]   Emersen Mascari D Elonda Husky 04/26/2020, 11:37 AM

## 2020-04-26 NOTE — Progress Notes (Signed)
PROGRESS NOTE    Kenneth Sullivan  [id number] DOB: 1956/07/09 DOA: 03/07/2020 PCP: Medicine, [name]   Brief Narrative: Kenneth Sullivan is a 64 y.o. unknown medical history, who has not seen a physician for several years prior to admission. He presented to the ED on 3/23 after an acute onset of shortness of breath with oxygen saturation in the 40% measure by EMS. Onsite, he had witnessed bradycardic event leading to asystolic/cardiac arrest. ROSC achieved in 20 minutes with 3 rounds of epinephrine.  Patient was brought to the ED intubated. Admitted to the ICU. CTPA showed massive PE/DVT and right ventricular strain. Underwent IR guided mechanical thrombectomy and IVC filter. Post procedure, while on heparin drip patient started having maroon-colored red stool and bloody drainage from OGT. GI did endoscopy which showed oozing  gastric ulcer with clots. IR did GDA embolization.  Patient remained encephalopathic. On 3/27, MRI of the brain showed multifocal acute to subacute ischemic infarct involving bilateral cerebral hemisphere consistent with global hypoperfusion related to cardiac arrest, also associated with scatter petechial hemorrhage with evidence of hemorrhagic conversion in the parietal lobe. On 4/5, underwent tracheostomy. Patient had dysphagia, he was a started on dysphagia 2 diet. He had poor appetite. His appetite has improved and he is not longer requiring tube feedings. No need for PEG tube at this time.  He has been agitated, psych was consulted to help with medications. Patient was started on  Trileptal, Prozac.    Assessment & Plan:   Principal Problem:   Cardiac arrest [facility]) Active Problems:   Encounter for central line placement   GI bleed   Acute respiratory failure (HCC)   Anoxic encephalopathy (HCC)   Cerebral embolism with cerebral infarction   Pulmonary emboli [facility])   Palliative care by specialist   Goals of care, counseling/discussion   DNR (do  not resuscitate)   Status post tracheostomy [facility])   Cardiac arrest Secondary to massive PE.  Massive pulmonary embolism Patient underwent IR guided mechanical thrombectomy and IVC filter. Cannot anticoagulate secondary to hemorrhagic stroke and acute GI bleeding.  Anoxic brain injury Hemorrhagic stroke Secondary to cardiac arrest.  Acute blood loss anemia In setting of hemorrhagic stroke and upper GI bleeding. Patient underwent upper endoscopy which was significant for obstructive oozing gastric ulcer. Patient underwent GDA embolization. Hemoglobin stable. -Continue PPI  Acute hypoxic encephalopathy Secondary to cardiac arrest. Improved. Patient is functional.  Dysphagia -Continue Dysphagia 3 diet  Leukocytosis Likely related to demargination from steroids. Resolved.  Deconditioning PT/OT recommending SNF.  Hallucinations Delirium Psychiatry consulted.  -Continue Seroquel, Prozac   DVT prophylaxis: SCDs Code Status:   Code Status: DNR Family Communication: None Disposition Plan: Discharge to SNF when bed available   Consultants:   Neurology  PCCM  Gastroenterology  Procedures:   UPPER ENDOSCOPY (03/07/2020) Impression:               - Normal esophagus.                           - Partially obstructing oozing gastric ulcer with                            adherent clot. Perforation of the bowel wall cannot                            be ruled out.                           -  Normal examined duodenum.                           - No specimens collected. Recommendation:           - NPO.                           - Observe patient's clinical course.                           - Continue Protonix drip. Hold IV Heparin if                            possible. Embolization by IR and if not amenable to                            that and rebleeding occurs then will need surgery                            if possible. Not amenable to endoscopic therapeutic                             maneuvers.  Antimicrobials:  Doxycycline    Subjective: Concerned about a grasshopper in his shoe. No other concerns  Objective: Vitals:   04/25/20 0838 04/25/20 1617 04/25/20 2222 04/26/20 1628  BP: 90/68 95/71 94/76  105/70  Pulse: (!) 102 (!) 106 (!) 102 (!) 109  Resp: 20 19 18 16   Temp: 97.7 F (36.5 C) 98.5 F (36.9 C) 98.6 F (37 C)   TempSrc: Axillary Axillary Oral   SpO2: 95% 100% 100% 99%  Weight:      Height:        Intake/Output Summary (Last 24 hours) at 04/26/2020 1631 Last data filed at 04/26/2020 1503 Gross per 24 hour  Intake 840 ml  Output 150 ml  Net 690 ml   Filed Weights   03/23/20 0342 03/25/20 0500 03/27/20 0500  Weight: 77.8 kg 77.7 kg 70.3 kg    Examination:  General exam: Appears calm and comfortable Respiratory system: Clear to auscultation. Respiratory effort normal. Cardiovascular system: S1 & S2 heard, RRR. No murmurs, rubs, gallops or clicks. Gastrointestinal system: Abdomen is nondistended, soft and nontender. No organomegaly or masses felt. Normal bowel sounds heard. Central nervous system: Alert and oriented to person and place. Extremities: No edema. No calf tenderness Skin: No cyanosis. No rashes Psychiatry: Judgement and insight appear impaired. Mood & affect appropriate.     Data Reviewed: I have personally reviewed following labs and imaging studies  CBC: Recent Labs  Lab 04/22/20 0330 04/24/20 0302 04/26/20 0325  WBC 6.8 6.5 6.4  HGB 9.7* 9.7* 8.9*  HCT 30.4* 29.4* 27.3*  MCV 94.4 94.2 94.1  PLT 197 256 035   Basic Metabolic Panel: Recent Labs  Lab 04/22/20 0330 04/22/20 1119 04/24/20 0302 04/26/20 0325  NA 140  --  141 141  K 4.1  --  3.8 4.0  CL 105  --  107 107  CO2 24  --  25 24  GLUCOSE 85  --  99 93  BUN 15  --  13 14  CREATININE 0.73  --  0.84 0.89  CALCIUM 8.8*  --  8.7* 8.3*  MG  --  1.8 2.0 1.9   GFR: Estimated Creatinine Clearance: 85.6 mL/min (by C-G formula based on SCr  of 0.89 mg/dL). Liver Function Tests: No results for input(s): AST, ALT, ALKPHOS, BILITOT, PROT, ALBUMIN in the last 168 hours. No results for input(s): LIPASE, AMYLASE in the last 168 hours. Recent Labs  Lab 04/22/20 0330  AMMONIA 37*   Coagulation Profile: No results for input(s): INR, PROTIME in the last 168 hours. Cardiac Enzymes: No results for input(s): CKTOTAL, CKMB, CKMBINDEX, TROPONINI in the last 168 hours. BNP (last 3 results) No results for input(s): PROBNP in the last 8760 hours. HbA1C: No results for input(s): HGBA1C in the last 72 hours. CBG: Recent Labs  Lab 04/25/20 1614 04/25/20 2213 04/26/20 0830 04/26/20 1125 04/26/20 1625  GLUCAP 75 84 93 135* 87   Lipid Profile: No results for input(s): CHOL, HDL, LDLCALC, TRIG, CHOLHDL, LDLDIRECT in the last 72 hours. Thyroid Function Tests: No results for input(s): TSH, T4TOTAL, FREET4, T3FREE, THYROIDAB in the last 72 hours. Anemia Panel: No results for input(s): VITAMINB12, FOLATE, FERRITIN, TIBC, IRON, RETICCTPCT in the last 72 hours. Sepsis Labs: No results for input(s): PROCALCITON, LATICACIDVEN in the last 168 hours.  No results found for this or any previous visit (from the past 240 hour(s)).       Radiology Studies: No results found.      Scheduled Meds: . chlorhexidine gluconate (MEDLINE KIT)  15 mL Mouth Rinse BID  . feeding supplement (ENSURE ENLIVE)  237 mL Oral QID  . FLUoxetine  10 mg Oral QHS  . OXcarbazepine  75 mg Oral BID  . pantoprazole  40 mg Oral BID  . QUEtiapine  50 mg Oral BID   Continuous Infusions: . sodium chloride       LOS: 50 days     Cordelia Poche, MD [facility] 04/26/2020, 4:31 PM  If 7PM-7AM, please contact night-coverage [email or web address]

## 2020-04-27 LAB — GLUCOSE, CAPILLARY
Glucose-Capillary: 98 mg/dL (ref 70–99)
Glucose-Capillary: 104 mg/dL — ABNORMAL HIGH (ref 70–99)
Glucose-Capillary: 88 mg/dL (ref 70–99)
Glucose-Capillary: 95 mg/dL (ref 70–99)

## 2020-04-27 NOTE — Progress Notes (Signed)
Pt did fairly well overnight. Pt didn't have any behavior issues other than hallucinating. Pt stated that he cant see out of his left eye. Not sure how accurate this is given his history. Pt is complaining of abdominal pain. Stool softener given.

## 2020-04-27 NOTE — Plan of Care (Signed)
  Problem: Activity: Goal: Ability to tolerate increased activity will improve Outcome: Progressing   Problem: Nutrition: Goal: Adequate nutrition will be maintained Outcome: Progressing   Problem: Pain Managment: Goal: General experience of comfort will improve Outcome: Progressing   Problem: Skin Integrity: Goal: Risk for impaired skin integrity will decrease Outcome: Progressing   Problem: Activity: Goal: Ability to tolerate increased activity will improve Outcome: Not Progressing   Problem: Role Relationship: Goal: Method of communication will improve Outcome: Not Progressing   Problem: Role Relationship: Goal: Method of communication will improve Outcome: Not Progressing   Problem: Education: Goal: Knowledge of General Education information will improve Description: Including pain rating scale, medication(s)/side effects and non-pharmacologic comfort measures Outcome: Not Progressing   Problem: Health Behavior/Discharge Planning: Goal: Ability to manage health-related needs will improve Outcome: Not Progressing   Problem: Clinical Measurements: Goal: Will remain free from infection Outcome: Not Progressing Goal: Diagnostic test results will improve Outcome: Not Progressing Goal: Respiratory complications will improve Outcome: Not Progressing Goal: Cardiovascular complication will be avoided Outcome: Not Progressing   Problem: Coping: Goal: Level of anxiety will decrease Outcome: Not Progressing   Problem: Elimination: Goal: Will not experience complications related to bowel motility Outcome: Not Progressing Goal: Will not experience complications related to urinary retention Outcome: Not Progressing   Problem: Safety: Goal: Ability to remain free from injury will improve Outcome: Not Progressing

## 2020-04-27 NOTE — Progress Notes (Signed)
PROGRESS NOTE    Kenneth Sullivan  OIT:[id number] DOB: Mar 04, 1956 DOA: 03/07/2020 PCP: Medicine, [name]   Brief Narrative: Kenneth Sullivan is a 64 y.o. unknown medical history, who has not seen a physician for several years prior to admission. He presented to the ED on 3/23 after an acute onset of shortness of breath with oxygen saturation in the 40% measure by EMS. Onsite, he had witnessed bradycardic event leading to asystolic/cardiac arrest. ROSC achieved in 20 minutes with 3 rounds of epinephrine.  Patient was brought to the ED intubated. Admitted to the ICU. CTPA showed massive PE/DVT and right ventricular strain. Underwent IR guided mechanical thrombectomy and IVC filter. Post procedure, while on heparin drip patient started having maroon-colored red stool and bloody drainage from OGT. GI did endoscopy which showed oozing  gastric ulcer with clots. IR did GDA embolization.  Patient remained encephalopathic. On 3/27, MRI of the brain showed multifocal acute to subacute ischemic infarct involving bilateral cerebral hemisphere consistent with global hypoperfusion related to cardiac arrest, also associated with scatter petechial hemorrhage with evidence of hemorrhagic conversion in the parietal lobe. On 4/5, underwent tracheostomy. Patient had dysphagia, he was a started on dysphagia 2 diet. He had poor appetite. His appetite has improved and he is not longer requiring tube feedings. No need for PEG tube at this time.  He has been agitated, psych was consulted to help with medications. Patient was started on  Trileptal, Prozac.    Assessment & Plan:   Principal Problem:   Cardiac arrest [facility]) Active Problems:   Encounter for central line placement   GI bleed   Acute respiratory failure (HCC)   Anoxic encephalopathy (HCC)   Cerebral embolism with cerebral infarction   Pulmonary emboli [facility])   Palliative care by specialist   Goals of care, counseling/discussion   DNR (do  not resuscitate)   Status post tracheostomy [facility])   Cardiac arrest Secondary to massive PE.  Massive pulmonary embolism Patient underwent IR guided mechanical thrombectomy and IVC filter. Cannot anticoagulate secondary to hemorrhagic stroke and acute GI bleeding.  Anoxic brain injury Hemorrhagic stroke Secondary to cardiac arrest.  Acute blood loss anemia In setting of hemorrhagic stroke and upper GI bleeding. Patient underwent upper endoscopy which was significant for obstructive oozing gastric ulcer. Patient underwent GDA embolization. Hemoglobin stable. -Continue PPI  Acute hypoxic encephalopathy Secondary to cardiac arrest. Improved. Patient is functional.  Dysphagia -Continue Dysphagia 3 diet  Leukocytosis Likely related to demargination from steroids. Resolved.  Deconditioning PT/OT recommending SNF.  Hallucinations Delirium Psychiatry consulted with recommendations below. Delirium resolved. -Continue Seroquel, Prozac, Trileptal   DVT prophylaxis: SCDs Code Status:   Code Status: DNR Family Communication: None Disposition Plan: Discharge to SNF when bed available   Consultants:   Neurology  PCCM  Gastroenterology  Procedures:   UPPER ENDOSCOPY (03/07/2020) Impression:               - Normal esophagus.                           - Partially obstructing oozing gastric ulcer with                            adherent clot. Perforation of the bowel wall cannot  be ruled out.                           - Normal examined duodenum.                           - No specimens collected. Recommendation:           - NPO.                           - Observe patient's clinical course.                           - Continue Protonix drip. Hold IV Heparin if                            possible. Embolization by IR and if not amenable to                            that and rebleeding occurs then will need surgery                            if  possible. Not amenable to endoscopic therapeutic                            maneuvers.  Antimicrobials:  Doxycycline    Subjective: Was asking for details about his hospitalization. Mentioned some severe abdominal pain earlier this morning that has now resolved.  Objective: Vitals:   04/25/20 2222 04/26/20 1628 04/26/20 2150 04/27/20 0812  BP: 94/76 105/70 108/75 98/73  Pulse: (!) 102 (!) 109 100 94  Resp: 18 16  16   Temp: 98.6 F (37 C) 98.8 F (37.1 C) 98.6 F (37 C) 98.4 F (36.9 C)  TempSrc: Oral Oral Oral   SpO2: 100% 99% 98% 97%  Weight:      Height:        Intake/Output Summary (Last 24 hours) at 04/27/2020 0943 Last data filed at 04/27/2020 0845 Gross per 24 hour  Intake 1280 ml  Output 150 ml  Net 1130 ml   Filed Weights   03/23/20 0342 03/25/20 0500 03/27/20 0500  Weight: 77.8 kg 77.7 kg 70.3 kg    Examination:  General exam: Appears calm and comfortable Respiratory system: Clear to auscultation. Respiratory effort normal. Cardiovascular system: S1 & S2 heard, RRR. No murmurs, rubs, gallops or clicks. Gastrointestinal system: Abdomen is nondistended, soft and nontender. No organomegaly or masses felt. Normal bowel sounds heard. Central nervous system: Alert and oriented. No focal neurological deficits. Extremities: No edema. No calf tenderness Skin: No cyanosis. No rashes Psychiatry: Mood & affect appropriate.     Data Reviewed: I have personally reviewed following labs and imaging studies  CBC: Recent Labs  Lab 04/22/20 0330 04/24/20 0302 04/26/20 0325  WBC 6.8 6.5 6.4  HGB 9.7* 9.7* 8.9*  HCT 30.4* 29.4* 27.3*  MCV 94.4 94.2 94.1  PLT 197 256 454   Basic Metabolic Panel: Recent Labs  Lab 04/22/20 0330 04/22/20 1119 04/24/20 0302 04/26/20 0325  NA 140  --  141 141  K 4.1  --  3.8 4.0  CL 105  --  107 107  CO2  24  --  25 24  GLUCOSE 85  --  99 93  BUN 15  --  13 14  CREATININE 0.73  --  0.84 0.89  CALCIUM 8.8*  --  8.7* 8.3*    MG  --  1.8 2.0 1.9   GFR: Estimated Creatinine Clearance: 85.6 mL/min (by C-G formula based on SCr of 0.89 mg/dL). Liver Function Tests: No results for input(s): AST, ALT, ALKPHOS, BILITOT, PROT, ALBUMIN in the last 168 hours. No results for input(s): LIPASE, AMYLASE in the last 168 hours. Recent Labs  Lab 04/22/20 0330  AMMONIA 37*   Coagulation Profile: No results for input(s): INR, PROTIME in the last 168 hours. Cardiac Enzymes: No results for input(s): CKTOTAL, CKMB, CKMBINDEX, TROPONINI in the last 168 hours. BNP (last 3 results) No results for input(s): PROBNP in the last 8760 hours. HbA1C: No results for input(s): HGBA1C in the last 72 hours. CBG: Recent Labs  Lab 04/26/20 0830 04/26/20 1125 04/26/20 1625 04/26/20 2052 04/27/20 0803  GLUCAP 93 135* 87 99 98   Lipid Profile: No results for input(s): CHOL, HDL, LDLCALC, TRIG, CHOLHDL, LDLDIRECT in the last 72 hours. Thyroid Function Tests: No results for input(s): TSH, T4TOTAL, FREET4, T3FREE, THYROIDAB in the last 72 hours. Anemia Panel: No results for input(s): VITAMINB12, FOLATE, FERRITIN, TIBC, IRON, RETICCTPCT in the last 72 hours. Sepsis Labs: No results for input(s): PROCALCITON, LATICACIDVEN in the last 168 hours.  No results found for this or any previous visit (from the past 240 hour(s)).       Radiology Studies: No results found.      Scheduled Meds: . chlorhexidine gluconate (MEDLINE KIT)  15 mL Mouth Rinse BID  . feeding supplement (ENSURE ENLIVE)  237 mL Oral QID  . FLUoxetine  10 mg Oral QHS  . OXcarbazepine  75 mg Oral BID  . pantoprazole  40 mg Oral BID  . QUEtiapine  50 mg Oral BID   Continuous Infusions: . sodium chloride       LOS: 51 days     Cordelia Poche, MD [facility] 04/27/2020, 9:43 AM  If 7PM-7AM, please contact night-coverage [email or web address]

## 2020-04-28 ENCOUNTER — Inpatient Hospital Stay (HOSPITAL_COMMUNITY): Payer: Medicaid Other

## 2020-04-28 LAB — CBC
HCT: 29 % — ABNORMAL LOW (ref 39.0–52.0)
Hemoglobin: 9.2 g/dL — ABNORMAL LOW (ref 13.0–17.0)
MCH: 29.9 pg (ref 26.0–34.0)
MCHC: 31.7 g/dL (ref 30.0–36.0)
MCV: 94.2 fL (ref 80.0–100.0)
Platelets: 338 10*3/uL (ref 150–400)
RBC: 3.08 MIL/uL — ABNORMAL LOW (ref 4.22–5.81)
RDW: 17.6 % — ABNORMAL HIGH (ref 11.5–15.5)
WBC: 6.6 10*3/uL (ref 4.0–10.5)
nRBC: 0 % (ref 0.0–0.2)

## 2020-04-28 LAB — GLUCOSE, CAPILLARY
Glucose-Capillary: 88 mg/dL (ref 70–99)
Glucose-Capillary: 87 mg/dL (ref 70–99)
Glucose-Capillary: 85 mg/dL (ref 70–99)
Glucose-Capillary: 87 mg/dL (ref 70–99)

## 2020-04-28 LAB — BASIC METABOLIC PANEL WITH GFR
Anion gap: 5 (ref 5–15)
BUN: 9 mg/dL (ref 8–23)
CO2: 24 mmol/L (ref 22–32)
Calcium: 8.4 mg/dL — ABNORMAL LOW (ref 8.9–10.3)
Chloride: 111 mmol/L (ref 98–111)
Creatinine, Ser: 0.75 mg/dL (ref 0.61–1.24)
GFR calc Af Amer: >60 mL/min
GFR calc non Af Amer: >60 mL/min
Glucose, Bld: 92 mg/dL (ref 70–99)
Potassium: 3.7 mmol/L (ref 3.5–5.1)
Sodium: 140 mmol/L (ref 135–145)

## 2020-04-28 NOTE — Progress Notes (Signed)
Palliative Medicine RN Note: Patient discussed in team rounds.  At this time, [facility] has nothing further to offer Mr Cera, and [place] are set, and he is pending placement. Our team will sign off.   Please re-consult if new needs arise.  Marjie Skiff Ravyn Nikkel, RN, BSN, [facility] Palliative Medicine Team 04/28/2020 10:07 AM Office [phone]

## 2020-04-28 NOTE — Progress Notes (Signed)
OT Cancellation Note  Patient Details Name: Dacion Faulk MRN: [id number] DOB: 1956-05-27   Cancelled Treatment:    Reason Eval/Treat Not Completed: Patient at procedure or test/ unavailable. Will check back as able.   Daiva Huge Lorraine-COTA/L 04/28/2020, 10:50 AM

## 2020-04-28 NOTE — Plan of Care (Signed)
  Problem: Clinical Measurements: Goal: Will remain free from infection Outcome: Progressing   Problem: Coping: Goal: Level of anxiety will decrease Outcome: Not Progressing

## 2020-04-28 NOTE — Progress Notes (Signed)
PROGRESS NOTE    Kenneth Sullivan  VNR:[id number] DOB: Oct 15, 1956 DOA: 03/07/2020 PCP: Medicine, [name]   Brief Narrative: Kenneth Sullivan is a 64 y.o. unknown medical history, who has not seen a physician for several years prior to admission. He presented to the ED on 3/23 after an acute onset of shortness of breath with oxygen saturation in the 40% measure by EMS. Onsite, he had witnessed bradycardic event leading to asystolic/cardiac arrest. ROSC achieved in 20 minutes with 3 rounds of epinephrine.  Patient was brought to the ED intubated. Admitted to the ICU. CTPA showed massive PE/DVT and right ventricular strain. Underwent IR guided mechanical thrombectomy and IVC filter. Post procedure, while on heparin drip patient started having maroon-colored red stool and bloody drainage from OGT. GI did endoscopy which showed oozing  gastric ulcer with clots. IR did GDA embolization.  Patient remained encephalopathic. On 3/27, MRI of the brain showed multifocal acute to subacute ischemic infarct involving bilateral cerebral hemisphere consistent with global hypoperfusion related to cardiac arrest, also associated with scatter petechial hemorrhage with evidence of hemorrhagic conversion in the parietal lobe. On 4/5, underwent tracheostomy. Patient had dysphagia, he was a started on dysphagia 2 diet. He had poor appetite. His appetite has improved and he is not longer requiring tube feedings. No need for PEG tube at this time.  He has been agitated, psych was consulted to help with medications. Patient was started on  Trileptal, Prozac.    Assessment & Plan:   Principal Problem:   Cardiac arrest [facility]) Active Problems:   Encounter for central line placement   GI bleed   Acute respiratory failure (HCC)   Anoxic encephalopathy (HCC)   Cerebral embolism with cerebral infarction   Pulmonary emboli [facility])   Palliative care by specialist   Goals of care, counseling/discussion   DNR (do  not resuscitate)   Status post tracheostomy [facility])   Cardiac arrest Secondary to massive PE.  Massive pulmonary embolism Patient underwent IR guided mechanical thrombectomy and IVC filter. Cannot anticoagulate secondary to hemorrhagic stroke and acute GI bleeding.  Anoxic brain injury Hemorrhagic stroke Secondary to cardiac arrest.  Acute blood loss anemia In setting of hemorrhagic stroke and upper GI bleeding. Patient underwent upper endoscopy which was significant for obstructive oozing gastric ulcer. Patient underwent GDA embolization. Hemoglobin stable. -Continue PPI  Acute hypoxic encephalopathy Secondary to cardiac arrest. Improved. Patient is functional.  Abdominal pain Non-specific. Relieved with bowel movement. No other symptoms. -Abdominal x-ray  Dysphagia -Continue Dysphagia 3 diet  Leukocytosis Likely related to demargination from steroids. Resolved.  Deconditioning PT/OT recommending SNF.  Hallucinations Delirium Psychiatry consulted with recommendations below. Delirium resolved. -Continue Seroquel, Prozac, Trileptal   DVT prophylaxis: SCDs Code Status:   Code Status: DNR Family Communication: None Disposition Plan: Discharge to SNF when bed available   Consultants:   Neurology  PCCM  Gastroenterology  Procedures:   UPPER ENDOSCOPY (03/07/2020) Impression:               - Normal esophagus.                           - Partially obstructing oozing gastric ulcer with                            adherent clot. Perforation of the bowel wall cannot  be ruled out.                           - Normal examined duodenum.                           - No specimens collected. Recommendation:           - NPO.                           - Observe patient's clinical course.                           - Continue Protonix drip. Hold IV Heparin if                            possible. Embolization by IR and if not amenable to                             that and rebleeding occurs then will need surgery                            if possible. Not amenable to endoscopic therapeutic                            maneuvers.  Antimicrobials:  Doxycycline    Subjective: Abdominal pain persists. Severe per patient when it occurs. Located in [place]. Improved with bowel movement.  Objective: Vitals:   04/27/20 1633 04/27/20 2142 04/28/20 0559 04/28/20 0843  BP: 111/77 128/87 100/72 112/88  Pulse: 97 (!) 104 (!) 101   Resp: _0 Temp: 98.6 F (37 C) 98.2 F (36.8 C) 99.1 F (37.3 C) 98.4 F (36.9 C)  TempSrc:   Oral Oral  SpO2: 98% 96% 98% 100%  Weight:      Height:       No intake or output data in the 24 hours ending 04/28/20 0925 Filed Weights   03/23/20 0342 03/25/20 0500 03/27/20 0500  Weight: 77.8 kg 77.7 kg 70.3 kg    Examination:  General exam: Appears calm and comfortable Respiratory system: Clear to auscultation. Respiratory effort normal. Cardiovascular system: S1 & S2 heard, RRR. No murmurs, rubs, gallops or clicks. Gastrointestinal system: Abdomen is nondistended, soft and nontender. No organomegaly or masses felt. Normal bowel sounds heard. Central nervous system: Alert and oriented. No focal neurological deficits. Extremities: No edema. No calf tenderness Skin: No cyanosis. No rashes Psychiatry: Judgement and insight appear normal. Mood & affect appropriate. Hallucinations.    Data Reviewed: I have personally reviewed following labs and imaging studies  CBC: Recent Labs  Lab 04/22/20 0330 04/24/20 0302 04/26/20 0325 04/28/20 0401  WBC 6.8 6.5 6.4 6.6  HGB 9.7* 9.7* 8.9* 9.2*  HCT 30.4* 29.4* 27.3* 29.0*  MCV 94.4 94.2 94.1 94.2  PLT 197 256 278 219   Basic Metabolic Panel: Recent Labs  Lab 04/22/20 0330 04/22/20 1119 04/24/20 0302 04/26/20 0325 04/28/20 0401  NA 140  --  141 141 140  K 4.1  --  3.8 4.0 3.7  CL 105  --  107 107 111  CO2 24  --  25 24 24  GLUCOSE 85  --  99  93 92  BUN 15  --  _0 CREATININE 0.73  --  0.84 0.89 0.75  CALCIUM 8.8*  --  8.7* 8.3* 8.4*  MG  --  1.8 2.0 1.9  --    GFR: Estimated Creatinine Clearance: 95.2 mL/min (by C-G formula based on SCr of 0.75 mg/dL). Liver Function Tests: No results for input(s): AST, ALT, ALKPHOS, BILITOT, PROT, ALBUMIN in the last 168 hours. No results for input(s): LIPASE, AMYLASE in the last 168 hours. Recent Labs  Lab 04/22/20 0330  AMMONIA 37*   Coagulation Profile: No results for input(s): INR, PROTIME in the last 168 hours. Cardiac Enzymes: No results for input(s): CKTOTAL, CKMB, CKMBINDEX, TROPONINI in the last 168 hours. BNP (last 3 results) No results for input(s): PROBNP in the last 8760 hours. HbA1C: No results for input(s): HGBA1C in the last 72 hours. CBG: Recent Labs  Lab 04/27/20 1130 04/27/20 1646 04/27/20 2142 04/28/20 0556 04/28/20 0742  GLUCAP 104* 88 95 88 87   Lipid Profile: No results for input(s): CHOL, HDL, LDLCALC, TRIG, CHOLHDL, LDLDIRECT in the last 72 hours. Thyroid Function Tests: No results for input(s): TSH, T4TOTAL, FREET4, T3FREE, THYROIDAB in the last 72 hours. Anemia Panel: No results for input(s): VITAMINB12, FOLATE, FERRITIN, TIBC, IRON, RETICCTPCT in the last 72 hours. Sepsis Labs: No results for input(s): PROCALCITON, LATICACIDVEN in the last 168 hours.  No results found for this or any previous visit (from the past 240 hour(s)).       Radiology Studies: No results found.      Scheduled Meds: . chlorhexidine gluconate (MEDLINE KIT)  15 mL Mouth Rinse BID  . feeding supplement (ENSURE ENLIVE)  237 mL Oral QID  . FLUoxetine  10 mg Oral QHS  . OXcarbazepine  75 mg Oral BID  . pantoprazole  40 mg Oral BID  . QUEtiapine  50 mg Oral BID   Continuous Infusions: . sodium chloride       LOS: 52 days     Cordelia Poche, MD [facility] 04/28/2020, 9:25 AM  If 7PM-7AM, please contact night-coverage [email or web address]

## 2020-04-28 NOTE — Plan of Care (Signed)
  Problem: Clinical Measurements: Goal: Respiratory complications will improve Outcome: Progressing Goal: Cardiovascular complication will be avoided Outcome: Progressing   Problem: Nutrition: Goal: Adequate nutrition will be maintained Outcome: Progressing   

## 2020-04-29 LAB — GLUCOSE, CAPILLARY
Glucose-Capillary: 88 mg/dL (ref 70–99)
Glucose-Capillary: 111 mg/dL — ABNORMAL HIGH (ref 70–99)
Glucose-Capillary: 93 mg/dL (ref 70–99)
Glucose-Capillary: 83 mg/dL (ref 70–99)

## 2020-04-29 MED ORDER — POLYETHYLENE GLYCOL 3350 17 G PO PACK
17.0000 g | PACK | Freq: Every day | ORAL | Status: DC
  Administered 2020-04-30 – 2020-06-03 (×29): 17 g via ORAL
  Filled 2020-04-29 (×32): qty 1

## 2020-04-29 NOTE — Progress Notes (Signed)
PROGRESS NOTE    Kenneth Sullivan  TFT:[id number] DOB: [birth year] DOA: 03/07/2020 PCP: Medicine, [name]   Brief Narrative: Kenneth Sullivan is a 64 y.o. unknown medical history, who has not seen a physician for several years prior to admission. He presented to the ED on 3/23 after an acute onset of shortness of breath with oxygen saturation in the 40% measure by EMS. Onsite, he had witnessed bradycardic event leading to asystolic/cardiac arrest. ROSC achieved in 20 minutes with 3 rounds of epinephrine.  Patient was brought to the ED intubated. Admitted to the ICU. CTPA showed massive PE/DVT and right ventricular strain. Underwent IR guided mechanical thrombectomy and IVC filter. Post procedure, while on heparin drip patient started having maroon-colored red stool and bloody drainage from OGT. GI did endoscopy which showed oozing  gastric ulcer with clots. IR did GDA embolization.  Patient remained encephalopathic. On 3/27, MRI of the brain showed multifocal acute to subacute ischemic infarct involving bilateral cerebral hemisphere consistent with global hypoperfusion related to cardiac arrest, also associated with scatter petechial hemorrhage with evidence of hemorrhagic conversion in the parietal lobe. On 4/5, underwent tracheostomy. Patient had dysphagia, he was a started on dysphagia 2 diet. He had poor appetite. His appetite has improved and he is not longer requiring tube feedings. No need for PEG tube at this time.  He has been agitated, psych was consulted to help with medications. Patient was started on  Trileptal, Prozac.    Assessment & Plan:   Principal Problem:   Cardiac arrest [facility]) Active Problems:   Encounter for central line placement   GI bleed   Acute respiratory failure (HCC)   Anoxic encephalopathy (HCC)   Cerebral embolism with cerebral infarction   Pulmonary emboli [facility])   Palliative care by specialist   Goals of care, counseling/discussion   DNR (do  not resuscitate)   Status post tracheostomy [facility])   Cardiac arrest Secondary to massive PE.  Massive pulmonary embolism Patient underwent IR guided mechanical thrombectomy and IVC filter. Cannot anticoagulate secondary to hemorrhagic stroke and acute GI bleeding.  Anoxic brain injury Hemorrhagic stroke Secondary to cardiac arrest.  Acute blood loss anemia In setting of hemorrhagic stroke and upper GI bleeding. Patient underwent upper endoscopy which was significant for obstructive oozing gastric ulcer. Patient underwent GDA embolization. Hemoglobin stable. -Continue PPI  Acute hypoxic encephalopathy Secondary to cardiac arrest. Improved. Patient is functional.  Abdominal pain Non-specific. Relieved with bowel movement. No other symptoms. Abdominal x-ray without acute pathology.  Dysphagia -Continue Dysphagia 3 diet  Leukocytosis Likely related to demargination from steroids. Resolved.  Deconditioning PT/OT recommending SNF.  Hallucinations Delirium Psychiatry consulted with recommendations below. Delirium resolved. -Continue Seroquel, Prozac, Trileptal   DVT prophylaxis: SCDs Code Status:   Code Status: DNR Family Communication: None Disposition Plan: Discharge to SNF when bed available; can trial without safety sitter. Will discuss with nursing today.   Consultants:   Neurology  PCCM  Gastroenterology  Procedures:   UPPER ENDOSCOPY (03/07/2020) Impression:               - Normal esophagus.                           - Partially obstructing oozing gastric ulcer with                            adherent clot. Perforation of the bowel wall cannot  be ruled out.                           - Normal examined duodenum.                           - No specimens collected. Recommendation:           - NPO.                           - Observe patient's clinical course.                           - Continue Protonix drip. Hold IV Heparin if                             possible. Embolization by IR and if not amenable to                            that and rebleeding occurs then will need surgery                            if possible. Not amenable to endoscopic therapeutic                            maneuvers.  Antimicrobials:  Doxycycline    Subjective: No issues overnight  Objective: Vitals:   04/28/20 0843 04/28/20 2058 04/29/20 0639 04/29/20 0835  BP: 112/88 92/62  (!) 137/110  Pulse:  95  100  Resp: 18 18  17   Temp: 98.4 F (36.9 C) 99.2 F (37.3 C)  98.2 F (36.8 C)  TempSrc: Oral Oral    SpO2: 100% 97%  93%  Weight:   63 kg   Height:        Intake/Output Summary (Last 24 hours) at 04/29/2020 1104 Last data filed at 04/28/2020 1230 Gross per 24 hour  Intake 360 ml  Output --  Net 360 ml   Filed Weights   03/25/20 0500 03/27/20 0500 04/29/20 0639  Weight: 77.7 kg 70.3 kg 63 kg    Examination:  General exam: Appears calm and comfortable    Data Reviewed: I have personally reviewed following labs and imaging studies  CBC: Recent Labs  Lab 04/24/20 0302 04/26/20 0325 04/28/20 0401  WBC 6.5 6.4 6.6  HGB 9.7* 8.9* 9.2*  HCT 29.4* 27.3* 29.0*  MCV 94.2 94.1 94.2  PLT 256 278 034   Basic Metabolic Panel: Recent Labs  Lab 04/22/20 1119 04/24/20 0302 04/26/20 0325 04/28/20 0401  NA  --  141 141 140  K  --  3.8 4.0 3.7  CL  --  107 107 111  CO2  --  25 24 24   GLUCOSE  --  99 93 92  BUN  --  13 14 9   CREATININE  --  0.84 0.89 0.75  CALCIUM  --  8.7* 8.3* 8.4*  MG 1.8 2.0 1.9  --    GFR: Estimated Creatinine Clearance: 85.4 mL/min (by C-G formula based on SCr of 0.75 mg/dL). Liver Function Tests: No results for input(s): AST, ALT, ALKPHOS, BILITOT, PROT, ALBUMIN in the last 168 hours. No results for input(s):  LIPASE, AMYLASE in the last 168 hours. No results for input(s): AMMONIA in the last 168 hours. Coagulation Profile: No results for input(s): INR, PROTIME in the last 168  hours. Cardiac Enzymes: No results for input(s): CKTOTAL, CKMB, CKMBINDEX, TROPONINI in the last 168 hours. BNP (last 3 results) No results for input(s): PROBNP in the last 8760 hours. HbA1C: No results for input(s): HGBA1C in the last 72 hours. CBG: Recent Labs  Lab 04/28/20 0556 04/28/20 0742 04/28/20 1312 04/28/20 2055 04/29/20 0736  GLUCAP 88 87 85 87 88   Lipid Profile: No results for input(s): CHOL, HDL, LDLCALC, TRIG, CHOLHDL, LDLDIRECT in the last 72 hours. Thyroid Function Tests: No results for input(s): TSH, T4TOTAL, FREET4, T3FREE, THYROIDAB in the last 72 hours. Anemia Panel: No results for input(s): VITAMINB12, FOLATE, FERRITIN, TIBC, IRON, RETICCTPCT in the last 72 hours. Sepsis Labs: No results for input(s): PROCALCITON, LATICACIDVEN in the last 168 hours.  No results found for this or any previous visit (from the past 240 hour(s)).       Radiology Studies: DG Abd 2 Views  Result Date: 04/28/2020 CLINICAL DATA:  64 year old male with abdominal pain. History of GI bleeding treated with mesenteric embolization. EXAM: ABDOMEN - 2 VIEW COMPARISON:  Noncontrast CT Abdomen and Pelvis 03/23/2020. Abdominal radiographs 04/11/2020 and earlier. FINDINGS: Three supine views of the abdomen and pelvis. Negative lung bases. Enteric feeding tube removed from last month. Stable IVC filter and epigastric embolization coils. Non obstructed bowel gas pattern. Decreased retained stool in the large bowel from last month. No acute osseous abnormality identified. IMPRESSION: No acute findings. Non obstructed bowel gas pattern with decreased retained stool in the colon from last month. Electronically Signed   By: Genevie Ann M.D.   On: 04/28/2020 12:46        Scheduled Meds: . chlorhexidine gluconate (MEDLINE KIT)  15 mL Mouth Rinse BID  . feeding supplement (ENSURE ENLIVE)  237 mL Oral QID  . FLUoxetine  10 mg Oral QHS  . OXcarbazepine  75 mg Oral BID  . pantoprazole  40 mg Oral  BID  . QUEtiapine  50 mg Oral BID   Continuous Infusions: . sodium chloride       LOS: 53 days     Cordelia Poche, MD [facility] 04/29/2020, 11:04 AM  If 7PM-7AM, please contact night-coverage [email or web address]

## 2020-04-30 LAB — GLUCOSE, CAPILLARY
Glucose-Capillary: 86 mg/dL (ref 70–99)
Glucose-Capillary: 85 mg/dL (ref 70–99)
Glucose-Capillary: 87 mg/dL (ref 70–99)
Glucose-Capillary: 105 mg/dL — ABNORMAL HIGH (ref 70–99)

## 2020-04-30 NOTE — Progress Notes (Signed)
PROGRESS NOTE    Kenneth Sullivan  MRN:[id number] DOB: 07/09/1956 DOA: 03/07/2020 PCP: Medicine, [name]   Brief Narrative: Kenneth Sullivan is a 63 y.o. unknown medical history, who has not seen a physician for several years prior to admission. He presented to the ED on 3/23 after an acute onset of shortness of breath with oxygen saturation in the 40% measure by EMS. Onsite, he had witnessed bradycardic event leading to asystolic/cardiac arrest. ROSC achieved in 20 minutes with 3 rounds of epinephrine.  Patient was brought to the ED intubated. Admitted to the ICU. CTPA showed massive PE/DVT and right ventricular strain. Underwent IR guided mechanical thrombectomy and IVC filter. Post procedure, while on heparin drip patient started having maroon-colored red stool and bloody drainage from OGT. GI did endoscopy which showed oozing  gastric ulcer with clots. IR did GDA embolization.  Patient remained encephalopathic. On 3/27, MRI of the brain showed multifocal acute to subacute ischemic infarct involving bilateral cerebral hemisphere consistent with global hypoperfusion related to cardiac arrest, also associated with scatter petechial hemorrhage with evidence of hemorrhagic conversion in the parietal lobe. On 4/5, underwent tracheostomy. Patient had dysphagia, he was a started on dysphagia 2 diet. He had poor appetite. His appetite has improved and he is not longer requiring tube feedings. No need for PEG tube at this time.  He has been agitated, psych was consulted to help with medications. Patient was started on  Trileptal, Prozac.    Assessment & Plan:   Principal Problem:   Cardiac arrest (HCC) Active Problems:   Encounter for central line placement   GI bleed   Acute respiratory failure (HCC)   Anoxic encephalopathy (HCC)   Cerebral embolism with cerebral infarction   Pulmonary emboli (HCC)   Palliative care by specialist   Goals of care, counseling/discussion   DNR (do  not resuscitate)   Status post tracheostomy (HCC)   Cardiac arrest Secondary to massive PE.  Massive pulmonary embolism Patient underwent IR guided mechanical thrombectomy and IVC filter. Cannot anticoagulate secondary to hemorrhagic stroke and acute GI bleeding.  Anoxic brain injury Hemorrhagic stroke Secondary to cardiac arrest.  Acute blood loss anemia In setting of hemorrhagic stroke and upper GI bleeding. Patient underwent upper endoscopy which was significant for obstructive oozing gastric ulcer. Patient underwent GDA embolization. Hemoglobin stable. -Continue PPI  Acute hypoxic encephalopathy Secondary to cardiac arrest. Improved. Patient is functional.  Abdominal pain Non-specific. Relieved with bowel movement. No other symptoms. Abdominal x-ray without acute pathology. Likely related to stool/gas -Bowel regimen  Dysphagia -Continue Dysphagia 3 diet  Leukocytosis Likely related to demargination from steroids. Resolved.  Deconditioning PT/OT recommending SNF.  Hallucinations Delirium Psychiatry consulted with recommendations below. Delirium resolved. -Continue Seroquel, Prozac, Trileptal   DVT prophylaxis: SCDs Code Status:   Code Status: DNR Family Communication: None Disposition Plan: Discharge to SNF when bed available. Patient is somewhat impulsive and frequently attempts to get out of bed on his own at this time and will continue to need a safety sitter until that improves as he is a high fall risk   Consultants:   Neurology  PCCM  Gastroenterology  Procedures:   UPPER ENDOSCOPY (03/07/2020) Impression:               - Normal esophagus.                           - Partially obstructing oozing gastric ulcer with                              adherent clot. Perforation of the bowel wall cannot                            be ruled out.                           - Normal examined duodenum.                           - No specimens  collected. Recommendation:           - NPO.                           - Observe patient's clinical course.                           - Continue Protonix drip. Hold IV Heparin if                            possible. Embolization by IR and if not amenable to                            that and rebleeding occurs then will need surgery                            if possible. Not amenable to endoscopic therapeutic                            maneuvers.  Antimicrobials:  Doxycycline    Subjective: No issues overnight  Objective: Vitals:   04/29/20 1736 04/29/20 2205 04/30/20 0500 04/30/20 0802  BP: 102/67 108/77  95/64  Pulse: 97 96  91  Resp: 17 16  20  Temp: 97.8 F (36.6 C) 98.7 F (37.1 C)  (!) 97.4 F (36.3 C)  TempSrc:  Oral  Oral  SpO2: 98% 100%  95%  Weight:   63.9 kg   Height:        Intake/Output Summary (Last 24 hours) at 04/30/2020 1010 Last data filed at 04/30/2020 0845 Gross per 24 hour  Intake 75 ml  Output --  Net 75 ml   Filed Weights   04/29/20 0639 04/29/20 1557 04/30/20 0500  Weight: 63 kg 63 kg 63.9 kg    Examination:  General exam: Appears calm and comfortable    Data Reviewed: I have personally reviewed following labs and imaging studies  CBC: Recent Labs  Lab 04/24/20 0302 04/26/20 0325 04/28/20 0401  WBC 6.5 6.4 6.6  HGB 9.7* 8.9* 9.2*  HCT 29.4* 27.3* 29.0*  MCV 94.2 94.1 94.2  PLT 256 278 338   Basic Metabolic Panel: Recent Labs  Lab 04/24/20 0302 04/26/20 0325 04/28/20 0401  NA 141 141 140  K 3.8 4.0 3.7  CL 107 107 111  CO2 25 24 24  GLUCOSE 99 93 92  BUN 13 14 9  CREATININE 0.84 0.89 0.75  CALCIUM 8.7* 8.3* 8.4*  MG 2.0 1.9  --    GFR: Estimated Creatinine Clearance: 86.5 mL/min (by C-G formula based on SCr of 0.75 mg/dL). Liver Function Tests: No results for input(s): AST, ALT, ALKPHOS, BILITOT, PROT, ALBUMIN in   the last 168 hours. No results for input(s): LIPASE, AMYLASE in the last 168 hours. No results for  input(s): AMMONIA in the last 168 hours. Coagulation Profile: No results for input(s): INR, PROTIME in the last 168 hours. Cardiac Enzymes: No results for input(s): CKTOTAL, CKMB, CKMBINDEX, TROPONINI in the last 168 hours. BNP (last 3 results) No results for input(s): PROBNP in the last 8760 hours. HbA1C: No results for input(s): HGBA1C in the last 72 hours. CBG: Recent Labs  Lab 04/29/20 0736 04/29/20 1146 04/29/20 1611 04/29/20 2104 04/30/20 0801  GLUCAP 88 111* 93 83 86   Lipid Profile: No results for input(s): CHOL, HDL, LDLCALC, TRIG, CHOLHDL, LDLDIRECT in the last 72 hours. Thyroid Function Tests: No results for input(s): TSH, T4TOTAL, FREET4, T3FREE, THYROIDAB in the last 72 hours. Anemia Panel: No results for input(s): VITAMINB12, FOLATE, FERRITIN, TIBC, IRON, RETICCTPCT in the last 72 hours. Sepsis Labs: No results for input(s): PROCALCITON, LATICACIDVEN in the last 168 hours.  No results found for this or any previous visit (from the past 240 hour(s)).       Radiology Studies: DG Abd 2 Views  Result Date: 04/28/2020 CLINICAL DATA:  63-year-old male with abdominal pain. History of GI bleeding treated with mesenteric embolization. EXAM: ABDOMEN - 2 VIEW COMPARISON:  Noncontrast CT Abdomen and Pelvis 03/23/2020. Abdominal radiographs 04/11/2020 and earlier. FINDINGS: Three supine views of the abdomen and pelvis. Negative lung bases. Enteric feeding tube removed from last month. Stable IVC filter and epigastric embolization coils. Non obstructed bowel gas pattern. Decreased retained stool in the large bowel from last month. No acute osseous abnormality identified. IMPRESSION: No acute findings. Non obstructed bowel gas pattern with decreased retained stool in the colon from last month. Electronically Signed   By: H  Hall M.D.   On: 04/28/2020 12:46        Scheduled Meds: . chlorhexidine gluconate (MEDLINE KIT)  15 mL Mouth Rinse BID  . feeding supplement (ENSURE  ENLIVE)  237 mL Oral QID  . FLUoxetine  10 mg Oral QHS  . OXcarbazepine  75 mg Oral BID  . pantoprazole  40 mg Oral BID  . polyethylene glycol  17 g Oral Daily  . QUEtiapine  50 mg Oral BID   Continuous Infusions: . sodium chloride       LOS: 54 days      , MD [facility] 04/30/2020, 10:10 AM  If 7PM-7AM, please contact night-coverage [email or web address] 

## 2020-05-01 ENCOUNTER — Inpatient Hospital Stay (HOSPITAL_COMMUNITY): Payer: Medicaid Other

## 2020-05-01 LAB — GLUCOSE, CAPILLARY
Glucose-Capillary: 84 mg/dL (ref 70–99)
Glucose-Capillary: 106 mg/dL — ABNORMAL HIGH (ref 70–99)
Glucose-Capillary: 93 mg/dL (ref 70–99)

## 2020-05-01 MED ORDER — IOHEXOL 300 MG/ML  SOLN
100.0000 mL | Freq: Once | INTRAMUSCULAR | Status: AC | PRN
  Administered 2020-05-01: 100 mL via INTRAVENOUS

## 2020-05-01 MED ORDER — IOHEXOL 9 MG/ML PO SOLN
500.0000 mL | ORAL | Status: AC
  Administered 2020-05-01 (×2): 500 mL via ORAL
  Filled 2020-05-01: qty 500

## 2020-05-01 NOTE — Progress Notes (Signed)
PROGRESS NOTE    Kenneth Sullivan  WGN:[id number] DOB: [birth year] DOA: 03/07/2020 PCP: Medicine, [name]   Brief Narrative: Kenneth Sullivan is a 64 y.o. unknown medical history, who has not seen a physician for several years prior to admission. He presented to the ED on 3/23 after an acute onset of shortness of breath with oxygen saturation in the 40% measure by EMS. Onsite, he had witnessed bradycardic event leading to asystolic/cardiac arrest. ROSC achieved in 20 minutes with 3 rounds of epinephrine.  Patient was brought to the ED intubated. Admitted to the ICU. CTPA showed massive PE/DVT and right ventricular strain. Underwent IR guided mechanical thrombectomy and IVC filter. Post procedure, while on heparin drip patient started having maroon-colored red stool and bloody drainage from OGT. GI did endoscopy which showed oozing  gastric ulcer with clots. IR did GDA embolization.  Patient remained encephalopathic. On 3/27, MRI of the brain showed multifocal acute to subacute ischemic infarct involving bilateral cerebral hemisphere consistent with global hypoperfusion related to cardiac arrest, also associated with scatter petechial hemorrhage with evidence of hemorrhagic conversion in the parietal lobe. On 4/5, underwent tracheostomy. Patient had dysphagia, he was a started on dysphagia 2 diet. He had poor appetite. His appetite has improved and he is not longer requiring tube feedings. No need for PEG tube at this time.  He has been agitated, psych was consulted to help with medications. Patient was started on  Trileptal, Prozac.    Assessment & Plan:   Principal Problem:   Cardiac arrest [facility]) Active Problems:   Encounter for central line placement   GI bleed   Acute respiratory failure (HCC)   Anoxic encephalopathy (HCC)   Cerebral embolism with cerebral infarction   Pulmonary emboli [name])   Palliative care by specialist   Goals of care, counseling/discussion   DNR (do  not resuscitate)   Status post tracheostomy [facility])   Cardiac arrest Secondary to massive PE.  Massive pulmonary embolism Patient underwent IR guided mechanical thrombectomy and IVC filter. Cannot anticoagulate secondary to hemorrhagic stroke and acute GI bleeding.  Anoxic brain injury Hemorrhagic stroke Secondary to cardiac arrest.  Acute blood loss anemia In setting of hemorrhagic stroke and upper GI bleeding. Patient underwent upper endoscopy which was significant for obstructive oozing gastric ulcer. Patient underwent GDA embolization. Hemoglobin stable. -Continue PPI  Acute hypoxic encephalopathy Secondary to cardiac arrest. Improved. Patient is functional.  Abdominal pain Non-specific. Relieved with bowel movement. No other symptoms. Abdominal x-ray without acute pathology. Likely related to stool/gas. Patient continues to be concerned about pain as it persists and is distressing -Bowel regimen -CT abdomen/pelvis  Dysphagia -Continue Dysphagia 3 diet  Leukocytosis Likely related to demargination from steroids. Resolved.  Deconditioning PT/OT recommending SNF.  Hallucinations Delirium Psychiatry consulted with recommendations below. Delirium resolved. -Continue Seroquel, Prozac, Trileptal   DVT prophylaxis: SCDs Code Status:   Code Status: DNR Family Communication: None Disposition Plan: Discharge to SNF when bed available; needs to not require a safety sitter   Consultants:   Neurology  PCCM  Gastroenterology  Procedures:   UPPER ENDOSCOPY (03/07/2020) Impression:               - Normal esophagus.                           - Partially obstructing oozing gastric ulcer with  adherent clot. Perforation of the bowel wall cannot                            be ruled out.                           - Normal examined duodenum.                           - No specimens collected. Recommendation:           - NPO.                            - Observe patient's clinical course.                           - Continue Protonix drip. Hold IV Heparin if                            possible. Embolization by IR and if not amenable to                            that and rebleeding occurs then will need surgery                            if possible. Not amenable to endoscopic therapeutic                            maneuvers.  Antimicrobials:  Doxycycline    Subjective: Continued episodes of abdominal pain which are severe per patient. Pain causes him to cry at times.  Objective: Vitals:   04/30/20 1635 04/30/20 2309 05/01/20 0648 05/01/20 0750  BP: 99/76 112/81  97/70  Pulse: 96 (!) 107  98  Resp:    17  Temp: 98.6 F (37 C)   98.6 F (37 C)  TempSrc: Oral Oral    SpO2: 95% 97%  96%  Weight:   63 kg   Height:        Intake/Output Summary (Last 24 hours) at 05/01/2020 1047 Last data filed at 04/30/2020 1833 Gross per 24 hour  Intake 125 ml  Output 350 ml  Net -225 ml   Filed Weights   04/29/20 1557 04/30/20 0500 05/01/20 0648  Weight: 63 kg 63.9 kg 63 kg    Examination:  General exam: Appears calm and comfortable Respiratory system: Clear to auscultation. Respiratory effort normal. Cardiovascular system: S1 & S2 heard, RRR. No murmurs, rubs, gallops or clicks. Gastrointestinal system: Abdomen is nondistended, soft and tender in RLQ. No organomegaly or masses felt. Normal bowel sounds heard. Central nervous system: Alert and oriented. No focal neurological deficits. Musculoskeletal: No edema. No calf tenderness Skin: No cyanosis. No rashes    Data Reviewed: I have personally reviewed following labs and imaging studies  CBC: Recent Labs  Lab 04/26/20 0325 04/28/20 0401  WBC 6.4 6.6  HGB 8.9* 9.2*  HCT 27.3* 29.0*  MCV 94.1 94.2  PLT 278 834   Basic Metabolic Panel: Recent Labs  Lab 04/26/20 0325 04/28/20 0401  NA 141 140  K 4.0 3.7  CL 107 111  CO2  24 24  GLUCOSE 93 92  BUN 14 9    CREATININE 0.89 0.75  CALCIUM 8.3* 8.4*  MG 1.9  --    GFR: Estimated Creatinine Clearance: 85.4 mL/min (by C-G formula based on SCr of 0.75 mg/dL). Liver Function Tests: No results for input(s): AST, ALT, ALKPHOS, BILITOT, PROT, ALBUMIN in the last 168 hours. No results for input(s): LIPASE, AMYLASE in the last 168 hours. No results for input(s): AMMONIA in the last 168 hours. Coagulation Profile: No results for input(s): INR, PROTIME in the last 168 hours. Cardiac Enzymes: No results for input(s): CKTOTAL, CKMB, CKMBINDEX, TROPONINI in the last 168 hours. BNP (last 3 results) No results for input(s): PROBNP in the last 8760 hours. HbA1C: No results for input(s): HGBA1C in the last 72 hours. CBG: Recent Labs  Lab 04/30/20 0801 04/30/20 1201 04/30/20 1529 04/30/20 2116 05/01/20 0754  GLUCAP 86 85 87 105* 84   Lipid Profile: No results for input(s): CHOL, HDL, LDLCALC, TRIG, CHOLHDL, LDLDIRECT in the last 72 hours. Thyroid Function Tests: No results for input(s): TSH, T4TOTAL, FREET4, T3FREE, THYROIDAB in the last 72 hours. Anemia Panel: No results for input(s): VITAMINB12, FOLATE, FERRITIN, TIBC, IRON, RETICCTPCT in the last 72 hours. Sepsis Labs: No results for input(s): PROCALCITON, LATICACIDVEN in the last 168 hours.  No results found for this or any previous visit (from the past 240 hour(s)).       Radiology Studies: No results found.      Scheduled Meds: . chlorhexidine gluconate (MEDLINE KIT)  15 mL Mouth Rinse BID  . feeding supplement (ENSURE ENLIVE)  237 mL Oral QID  . FLUoxetine  10 mg Oral QHS  . OXcarbazepine  75 mg Oral BID  . pantoprazole  40 mg Oral BID  . polyethylene glycol  17 g Oral Daily  . QUEtiapine  50 mg Oral BID   Continuous Infusions: . sodium chloride       LOS: 55 days     Cordelia Poche, MD [facility] 05/01/2020, 10:47 AM  If 7PM-7AM, please contact night-coverage [email or web address]

## 2020-05-01 NOTE — Progress Notes (Signed)
Nutrition Follow-up  RD working remotely.  DOCUMENTATION CODES:   Underweight, suspect malnutrition but unable to confirm at this time  INTERVENTION:   - Feeding assistance with meals and snacks  -Ensure Enlive poQID, each supplement provides 350 kcal and 20 grams of protein  - Magic Cup TID with meals, each supplement provides 290 kcal and 9 grams of protein  - Encourage adequate PO intake  NUTRITION DIAGNOSIS:   Inadequate oral intake related to lethargy/confusion as evidenced by meal completion < 25%.  Progressing  GOAL:   Patient will meet greater than or equal to 90% of their needs  Progressing  MONITOR:   PO intake, Supplement acceptance, Labs, Weight trends  REASON FOR ASSESSMENT:   Ventilator    ASSESSMENT:   64 yo male admitted S/P cardiac arrest, S/P normothermia protocol. Found to have massive PE, DVT, GI bleed from gastric ulcer. PMH includes current smoker, heavy alcohol use.  4/02 - trach  4/09 - Cortrak placed  4/11- pt pulled out trach, replaced by RT,TF held due to Cortrak leaking  4/13 - MBS, Dysphagia 1 with thin liquids started 4/14 - Cortrak removed 4/16 - Cortrak replaced 4/22 - Cortrak clogged, replaced by diagnostic radiology (tip in pre-pyloric region of stomach) 4/25 - diet advanced to Dysphagia 2 4/27 - tube feeds changed to nocturnal 4/28 - decannulated 4/30 - pulled Cortrak, TF stopped 5/03 - diet upgraded to Dysphagia 3 5/05 - diet upgraded to Regular  Unable to reach pt via phone call to room. Pt is pending SNF placement.  Pt complaining of abdominal pain per notes. CT abdomen/pelvis has been ordered. Per MD, likely related to stool/gas. Abdominal x-ray without acute pathology.  Pt accepting most Ensure Enlive per [facility] documentation.  Current weight: 63 kg Admit weight: 72.6 kg  Question recent weights as pt's weight has was in the 170-180 lb range 1 month ago. Will continue to monitor trends. Pt is now considered  underweight.  Meal Completion: 25-100% x last 8 recorded meals (averaging 89%)  Medications reviewed and include: Ensure Enlive QID, protonix, miralax  Labs reviewed. CBG's: 84-106 x 24 hours  Diet Order:   Diet Order            Diet regular Room service appropriate? Yes; Fluid consistency: Thin  Diet effective now              EDUCATION NEEDS:   Not appropriate for education at this time  Skin:  Skin Assessment: Reviewed RN Assessment  Last BM:  04/29/20 medium type 1  Height:   Ht Readings from Last 1 Encounters:  04/29/20 6\' 2"  (1.88 m)    Weight:   Wt Readings from Last 1 Encounters:  05/01/20 63 kg    Ideal Body Weight:  86.4 kg  BMI:  Body mass index is 17.85 kg/m.  Estimated Nutritional Needs:   Kcal:  2100-2300  Protein:  110-125 gm  Fluid:  >/= 2 L    Gaynell Face, MS, RD, LDN Inpatient Clinical Dietitian Pager: [phone] Weekend/After Hours: [phone]

## 2020-05-01 NOTE — Progress Notes (Signed)
Occupational Therapy Treatment Patient Details Name: Maarten Baisch MRN: [id number] DOB: Jul 17, 1956 Today's Date: 05/01/2020    History of present illness Pt is 64 yo male with unknown medical hx.  He presented to ED on 3/23 with acute SHOB.  He had bradycardiac event that lead to asystole/cardiac arrest (ROSC achieved in 20 mins with 3 rounds epinephrine).  Pt was intubated and admitted to ICU.  Pt with massive PE/DVT and RV strain.  He underwent IR guided mechanical thrombectomy and IVC Filter.   Pt developed gastric ulcer and required GDA embolization.  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 Pt s/p trach on 4/5 and changed to cuffless on 4/22--decannualted 4/28   OT comments  Patient continues to make progress towards goals in skilled OT session. Patient's session encompassed ADLs in bed as varying levels of cognition and lability limited therapy in session. Pt admitted to wanting to kill himself at the beginning of the session, due to the fact that his ex-fiance showed up with another man. (According to front staff, we are unsure if this is an actual story). Pt willing to get up with therapist, then changed his mind after therapist got room prepped. Pt completed bed level ADLs, and was informed that someone would more than likely come see him due to his admission of suicidal ideations. Pt continues to benefit greatly from therapy after acute stay to further aid in cognitive status; will continue to follow acutely.   Follow Up Recommendations  SNF;Supervision/Assistance - 24 hour    Equipment Recommendations  Other (comment)    Recommendations for Other Services      Precautions / Restrictions Precautions Precautions: Fall Restrictions Weight  Bearing Restrictions: No       Mobility Bed Mobility                  Transfers                      Balance                                           ADL either performed or assessed with clinical judgement   ADL Overall ADL's : Needs assistance/impaired     Grooming: Wash/dry hands;Wash/dry face;Oral care;Set up;Bed level Grooming Details (indicate cue type and reason): Completed in bed, waxes and wanes in motivation                             Functional mobility during ADLs: Minimal assistance;Cueing for safety;Cueing for sequencing General ADL Comments: Cognitive status remains greatest impairment limiting participation and function     Vision       Perception     Praxis      Cognition Arousal/Alertness: Awake/alert Behavior During Therapy: Restless;Impulsive;Anxious Overall Cognitive Status: No family/caregiver present to determine baseline cognitive functioning Area of Impairment: Orientation;Following commands;Problem solving;Safety/judgement;Attention;Memory;Awareness                 Orientation Level: Disoriented to;Time;Situation Current Attention Level: Focused Memory: Decreased recall of precautions;Decreased short-term memory Following Commands: Follows one step commands with increased time;Follows one step commands inconsistently Safety/Judgement: Decreased awareness of deficits;Decreased awareness  of safety Awareness: Emergent Problem Solving: Difficulty sequencing;Requires tactile cues;Requires verbal cues;Decreased initiation;Slow processing General Comments: Willing to participate in therapy, but changed mind quickly, perseverative on his ex-fiance showing up with another man and claims he is depressed and wants to kill himself        Exercises     Shoulder Instructions       General Comments      Pertinent Vitals/ Pain       Pain Assessment: Faces Faces Pain Scale: No hurt  Home  Living                                          Prior Functioning/Environment              Frequency  Min 2X/week        Progress Toward Goals  OT Goals(current goals can now be found in the care plan section)  Progress towards OT goals: Progressing toward goals  Acute Rehab OT Goals Patient Stated Goal: Pt unable OT Goal Formulation: Patient unable to participate in goal setting Time For Goal Achievement: 05/02/20 Potential to Achieve Goals: [place] Discharge plan remains appropriate    Co-evaluation                 AM-PAC OT "6 Clicks" Daily Activity     Outcome Measure   Help from another person eating meals?: A Lot Help from another person taking care of personal grooming?: A Lot Help from another person toileting, which includes using toliet, bedpan, or urinal?: A Lot Help from another person bathing (including washing, rinsing, drying)?: A Lot Help from another person to put on and taking off regular upper body clothing?: A Lot Help from another person to put on and taking off regular lower body clothing?: A Lot 6 Click Score: 12    End of Session    OT Visit Diagnosis: Unsteadiness on feet (R26.81);Other abnormalities of gait and mobility (R26.89);Muscle weakness (generalized) (M62.81);Low vision, both eyes (H54.2);Other symptoms and signs involving cognitive function   Activity Tolerance Patient limited by lethargy;Patient limited by fatigue   Patient Left in bed;with call bell/phone within reach;with bed alarm set;Other (comment)(RN and tech made aware of suicidal ideations)   Nurse Communication Mobility status;Other (comment)(Suicidal ideations)        Time: [id number] OT Time Calculation (min): 12 min  Charges: OT General Charges $OT Visit: 1 Visit OT Treatments $Self Care/Home Management : 8-22 mins  Corinne [place], [place] [facility] [place] 05/01/2020, 10:42 AM

## 2020-05-01 NOTE — TOC Progression Note (Addendum)
Transition of Care [facility]) - Progression Note    Patient Details  Name: Kenneth Sullivan MRN: [id number] Date of Birth: [birth year]  Transition of Care [facility]) CM/SW [place], RN Phone Number: [phone]  05/01/2020, 2:50 PM  Clinical Narrative: CM continues to follow patient with difficult placement due to restraints, falls, and safety sitter. Currently no bed offers. Patient will need to remain without a sitter for at least 48 hours before facilities will consider placement. Bed offer search extended to other areas. Attempted to call niece Kenneth Sullivan for update but no answer. CM will continue to follow.  05/02/2020 CM spoke with MD in reference to potential bed offers in [facility] and [place]. MD states that we will need to hold off on placement for now due to results from CT scan suspicious for gastric mass and liver lesions. MD will reevaluate appropriateness for discharge at a later date. CM will continue to follow.  05/04/20 CM called niece Kenneth Sullivan) to discuss bed search. Kenneth Sullivan has been made aware that patient has received no bed offers in [place] but has received offers in [place] and [place]. Kenneth Sullivan is ok with [place] if there is no other option but would really prefer somewhere close to [facility]. NO facilities listed in [facility] per [employer]. CM contacted [facility] in [place]. Spoke with [facility] in admissions who states that she can not see the patient information in the [place] and is requesting that CM send info again. Information has been resubmitted. Will contact facility for an update.  05/05/20@ 0900  CM called [name] the liaison for [facility] to follow up on bed offer. [name] is not aware of bed offer but will review patients information to determine if the facility can extend a bed offer. Will follow up.  05/08/20 @1040  CM called [facility] to follow up on bed request. Patients  information provided and [name] states that she will check to see if this facility can make a bed offer for this patient. CM will continue to follow.  05/10/20 0830 CM called [facility] to inquire about status of bed offer. [name] states that she asked the Environmental health practitioner to verify patients medicaid and she has not heard back from her. [name] to follow up and call CM back.   05/16/20@ 1445 CM called [facility] @ [phone] to verify that faxed information had been received and to determine if facility is able to make a bed offer. Message left on voice mail for the admissions coordinator. Will await return call  05/17/20@ 1048 Cm has attempted to call [facility]([phone] to follow up on verifying that previously faxed info has been received and to inquire if there is a bed offer for this patient. Initially asked to speak with [name] who was listed as the contact person. [name] is not available and CM was referred to [name] who is the admissions coordinator. [phone]). CM called [name] with no response. Message left and will await return call. CM will continue to follow.  05/18/20 @1020  Attempted to call [name] admissions coordinator @ [phone] to follow up on bed request. No answer message left to call CM back. Will await return call. [facility] rehab and was forwarded to the voicemail of  [name] admissions coordinator second message left. Will await return call.  05/22/20 @ 1500 CM attempted to call Kenneth Sullivan (niece) to discuss disposition and is family is agreeable to  pay for transport if we find placement at facility greater that 50 miles from hospital. A gentleman answers the phone and identifies himself as the son of Kenneth Sullivan. He states that she is in the restroom, can not come to the phone and wants to know if the CM can talk to him. CM has explained that she could not discuss anything with the young man but would await a return call from [name]. CM attempted to call [name] for [name] the admissions coordinator. Message left and will await return call.   05/23/2020 @1018  CM received call from Dr. Candiss Norse asking that CM plan for discharge tomorrow 05/23/20. CM explained to MD that CM has been working on d/c plan for weeks now with no bed offers. MD also made aware that CM has been unsucessful in contacting the niece. MD asked that CM go to bedside to call niece and let patient know. CM at bedside to contact niece with no success. Patient asked CM to text niece. Text message sent to niece to ask her to call CM to discuss discharge plan for her uncle. CM asked for permission to contact [name] who is listed as significant other but patient states that he does not want [name] to be contacted. Niece has not responded to text message. Will continue to work on disposition plan   05/23/20 Niece Kenneth Sullivan returned call. CM discussed discharge plan with niece. Niece states that if a facility near her accepted the patient that she would drive here to [facility] to pick him up. Niece inquiring why the facilities were not accepting the patient because he is sick. CM attempted to explain that the facilities have their reasons for not accepting patients and that CM can not control facilities not accepting patients but that CM could just keep trying to find a safe place for discharge. Cm asked niece would she be ok with patient being placed in facility in [place] if facility close to her did not make a bed offer. Niece states that she would have to accept it. CM tryingt to explain that we are limited to options and niece began to speak loudly telling CM that she detected in the Cm voice that the CM was "trying to snap on her" CM attempting to explain that she did not have every detail of why each facility declined patient. Niece hung up the phone. The conversation was on speaker phone and verified by Gomez Cleverly RN that the niece was escalating  the conversation and raising her voice. CM will continue to seek placement.    05/25/20 @ 1332 CM has reached out to Tammy with [facility] and [place] with [facility] to request that facilities come to visit patient to make a determination of appropriateness for bed offer. Messages left for both and will await return call. CM will continue to seek placement.   05/26/20 @1000   CM received call from Dr. Candiss Norse asking CM to go to bedside to speak with the patient and write a note. MD states that the patient wants to know why he cant go home. CM attempted to call niece Kenneth Sullivan before going in to speak with the patient. [place] phone went to a voicemail that has not been activated. CM at bedside to speak with patient. Patient is alert to self and place. Patient thinks it is 21 and can not verbalize who the president is. Patient is aware that he is at [facility] but can not verbalize the details of  why he is here. Patient does verbalize that he can not see out of one of his eyes causing him to need help with eating , dressing, and ambulating. Patient states that he lives at [place] with his significant other [name]. Patient gives CM permission to call [name]. Spoke with [name] who confirms that she lived with the patient. [name] begins to cry and states that the patient doesn't have a home to come back to. [name] states that the niece Kenneth Sullivan took everything from the patients room including his direct express card and stimulus check stating that she was the POA and needed the money to pay his funeral expenses because the patient was dying. [name] states that Kenneth Sullivan promised to pay the rent but did not do so resulting in her having to vacate the premises. Patient has been made aware of this and has been connected with [name] via the phone. CM will continue to follow.   05/26/20 @1030  Spoke with Tammy at [facility] . Information has been sent for  facility to come visit patient to determine if they can make a  Bed offer.   05/26/20 @1530  CM spoke with Tammy at [facility] who suggest CM seek ALF for placement due to patient being able to ambulate 113ft and no real skilled nursing needs. CM has reached out to [facility] per recommendation. Spoke with Writer at [facility]. Tori requested CM to fax notes FL2 and med list. The nurse is currently not in but will be in tonight. Herbert Spires states that she will have the nurse review patients information and determine appropriateness. Information has been faxed. CM will await decision. Patient has been updated. Will continue to follow.  Expected Discharge Plan: Skilled Nursing Facility Barriers to Discharge: [employer] Rosalie Gums)  Expected Discharge Plan and Services Expected Discharge Plan: [place] In-house Referral: Clinical Social Work     Living arrangements for the past 2 months: Apartment                                       Social Determinants of Health (SDOH) Interventions    Readmission Risk Interventions No flowsheet data found.

## 2020-05-02 DIAGNOSIS — R160 Hepatomegaly, not elsewhere classified: Secondary | ICD-10-CM

## 2020-05-02 LAB — CBC
HCT: 30 % — ABNORMAL LOW (ref 39.0–52.0)
Hemoglobin: 9.7 g/dL — ABNORMAL LOW (ref 13.0–17.0)
MCH: 29.8 pg (ref 26.0–34.0)
MCHC: 32.3 g/dL (ref 30.0–36.0)
MCV: 92 fL (ref 80.0–100.0)
Platelets: 398 10*3/uL (ref 150–400)
RBC: 3.26 MIL/uL — ABNORMAL LOW (ref 4.22–5.81)
RDW: 17 % — ABNORMAL HIGH (ref 11.5–15.5)
WBC: 6.9 10*3/uL (ref 4.0–10.5)
nRBC: 0 % (ref 0.0–0.2)

## 2020-05-02 LAB — COMPREHENSIVE METABOLIC PANEL
ALT: 10 U/L (ref 0–44)
AST: 16 U/L (ref 15–41)
Albumin: 2.2 g/dL — ABNORMAL LOW (ref 3.5–5.0)
Alkaline Phosphatase: 60 U/L (ref 38–126)
Anion gap: 7 (ref 5–15)
BUN: 10 mg/dL (ref 8–23)
CO2: 26 mmol/L (ref 22–32)
Calcium: 8.6 mg/dL — ABNORMAL LOW (ref 8.9–10.3)
Chloride: 105 mmol/L (ref 98–111)
Creatinine, Ser: 0.89 mg/dL (ref 0.61–1.24)
GFR calc Af Amer: >60 mL/min (ref >60)
GFR calc non Af Amer: >60 mL/min (ref >60)
Glucose, Bld: 90 mg/dL (ref 70–99)
Potassium: 4 mmol/L (ref 3.5–5.1)
Sodium: 138 mmol/L (ref 135–145)
Total Bilirubin: 0.4 mg/dL (ref 0.3–1.2)
Total Protein: 4.9 g/dL — ABNORMAL LOW (ref 6.5–8.1)

## 2020-05-02 LAB — GLUCOSE, CAPILLARY
Glucose-Capillary: 82 mg/dL (ref 70–99)
Glucose-Capillary: 93 mg/dL (ref 70–99)
Glucose-Capillary: 88 mg/dL (ref 70–99)
Glucose-Capillary: 234 mg/dL — ABNORMAL HIGH (ref 70–99)

## 2020-05-02 NOTE — Progress Notes (Signed)
PROGRESS NOTE    Kenneth Sullivan  CWU:[id number] DOB: Mar 31, 1956 DOA: 03/07/2020 PCP: Medicine, [name]   Brief Narrative: Taher Vannote is a 64 y.o. unknown medical history, who has not seen a physician for several years prior to admission. He presented to the ED on 3/23 after an acute onset of shortness of breath with oxygen saturation in the 40% measure by EMS. Onsite, he had witnessed bradycardic event leading to asystolic/cardiac arrest. ROSC achieved in 20 minutes with 3 rounds of epinephrine.  Patient was brought to the ED intubated. Admitted to the ICU. CTPA showed massive PE/DVT and right ventricular strain. Underwent IR guided mechanical thrombectomy and IVC filter. Post procedure, while on heparin drip patient started having maroon-colored red stool and bloody drainage from OGT. GI did endoscopy which showed oozing  gastric ulcer with clots. IR did GDA embolization.  Patient remained encephalopathic. On 3/27, MRI of the brain showed multifocal acute to subacute ischemic infarct involving bilateral cerebral hemisphere consistent with global hypoperfusion related to cardiac arrest, also associated with scatter petechial hemorrhage with evidence of hemorrhagic conversion in the parietal lobe. On 4/5, underwent tracheostomy. Patient had dysphagia, he was a started on dysphagia 2 diet. He had poor appetite. His appetite has improved and he is not longer requiring tube feedings. No need for PEG tube at this time.  He has been agitated, psych was consulted to help with medications. Patient was started on  Trileptal, Prozac.    Assessment & Plan:   Principal Problem:   Cardiac arrest [facility]) Active Problems:   Encounter for central line placement   GI bleed   Acute respiratory failure (HCC)   Anoxic encephalopathy (HCC)   Cerebral embolism with cerebral infarction   Pulmonary emboli ([place])   Palliative care by specialist   Goals of care, counseling/discussion   DNR (do  not resuscitate)   Status post tracheostomy ([place])   Abdominal pain Abnormal CT abdomen/pelvis Non-specific. Relieved with bowel movement. No other symptoms. Abdominal x-ray without acute pathology. Likely related to stool/gas. Patient continued to be concerned about pain as it persists and is distressing with associated tenderness. CT abdomen/pelvis obtained which incidentally is significant for gastric lesion in the antrum concerning for gastric antral mass (possibly related to ulcer with clot noted on EGD) in addition to multiple liver lesions concerning for metastatic disease not mentioned on previous CT imaging from 3/23 (CTA chest) or 4/8 (CT abdomen/pelvis WO contrast) -Discussed with GI who recommended IR biopsy of a liver lesion if able -IR consulted and will review -Bowel regimen  Cardiac arrest Secondary to massive PE.  Massive pulmonary embolism Patient underwent IR guided mechanical thrombectomy and IVC filter. Cannot anticoagulate secondary to hemorrhagic stroke and acute GI bleeding.  Anoxic brain injury Hemorrhagic stroke Secondary to cardiac arrest.  Acute blood loss anemia In setting of hemorrhagic stroke and upper GI bleeding. Patient underwent upper endoscopy which was significant for obstructive oozing gastric ulcer. Patient underwent GDA embolization. Hemoglobin stable. -Continue PPI  Acute hypoxic encephalopathy Secondary to cardiac arrest. Improved. Patient is functional.  Dysphagia -Continue Dysphagia 3 diet  Leukocytosis Likely related to demargination from steroids. Resolved.  Deconditioning PT/OT recommending SNF.  Hallucinations Delirium Psychiatry consulted with recommendations below. Delirium resolved. -Continue Seroquel, Prozac, Trileptal   DVT prophylaxis: SCDs Code Status:   Code Status: DNR Family Communication: None Disposition Plan: Discharge to SNF when bed available; needs to not require a safety sitter which was attempted and  failed. Also now with need for  possible cancer workup   Consultants:   Neurology  PCCM  Gastroenterology  Procedures:   UPPER ENDOSCOPY (03/07/2020) Impression:               - Normal esophagus.                           - Partially obstructing oozing gastric ulcer with                            adherent clot. Perforation of the bowel wall cannot                            be ruled out.                           - Normal examined duodenum.                           - No specimens collected. Recommendation:           - NPO.                           - Observe patient's clinical course.                           - Continue Protonix drip. Hold IV Heparin if                            possible. Embolization by IR and if not amenable to                            that and rebleeding occurs then will need surgery                            if possible. Not amenable to endoscopic therapeutic                            maneuvers.  Antimicrobials:  Doxycycline    Subjective: Still with some abdominal pain. No other concerns  Objective: Vitals:   05/01/20 0750 05/01/20 1726 05/01/20 2323 05/02/20 0746  BP: 97/70 133/82 95/73 (!) 82/65  Pulse: 98 93 94 92  Resp: 17 13  20   Temp: 98.6 F (37 C) 98.1 F (36.7 C) 98.2 F (36.8 C) (!) 97.3 F (36.3 C)  TempSrc:  Oral    SpO2: 96% 100% 96% 100%  Weight:      Height:        Intake/Output Summary (Last 24 hours) at 05/02/2020 1303 Last data filed at 05/02/2020 1235 Gross per 24 hour  Intake 10 ml  Output 400 ml  Net -390 ml   Filed Weights   04/29/20 1557 04/30/20 0500 05/01/20 0648  Weight: 63 kg 63.9 kg 63 kg    Examination:  General exam: Appears calm and comfortable Respiratory system: Clear to auscultation. Respiratory effort normal. Cardiovascular system: S1 & S2 heard, RRR. No murmurs, rubs, gallops or clicks. Gastrointestinal system: Abdomen is nondistended, soft and mildly tender in right quadrants. Normal  bowel sounds heard.  Central nervous system: Alert. No focal neurological deficits. Musculoskeletal: No edema. No calf tenderness Skin: No cyanosis. No rashes Psychiatry: Judgement and insight appear impaired. Tangential at times. Anxious appearing.   Data Reviewed: I have personally reviewed following labs and imaging studies  CBC: Recent Labs  Lab 04/26/20 0325 04/28/20 0401  WBC 6.4 6.6  HGB 8.9* 9.2*  HCT 27.3* 29.0*  MCV 94.1 94.2  PLT 278 784   Basic Metabolic Panel: Recent Labs  Lab 04/26/20 0325 04/28/20 0401  NA 141 140  K 4.0 3.7  CL 107 111  CO2 24 24  GLUCOSE 93 92  BUN 14 9  CREATININE 0.89 0.75  CALCIUM 8.3* 8.4*  MG 1.9  --    GFR: Estimated Creatinine Clearance: 85.4 mL/min (by C-G formula based on SCr of 0.75 mg/dL). Liver Function Tests: No results for input(s): AST, ALT, ALKPHOS, BILITOT, PROT, ALBUMIN in the last 168 hours. No results for input(s): LIPASE, AMYLASE in the last 168 hours. No results for input(s): AMMONIA in the last 168 hours. Coagulation Profile: No results for input(s): INR, PROTIME in the last 168 hours. Cardiac Enzymes: No results for input(s): CKTOTAL, CKMB, CKMBINDEX, TROPONINI in the last 168 hours. BNP (last 3 results) No results for input(s): PROBNP in the last 8760 hours. HbA1C: No results for input(s): HGBA1C in the last 72 hours. CBG: Recent Labs  Lab 05/01/20 0754 05/01/20 1144 05/01/20 2135 05/02/20 0745 05/02/20 1124  GLUCAP 84 106* 93 82 93   Lipid Profile: No results for input(s): CHOL, HDL, LDLCALC, TRIG, CHOLHDL, LDLDIRECT in the last 72 hours. Thyroid Function Tests: No results for input(s): TSH, T4TOTAL, FREET4, T3FREE, THYROIDAB in the last 72 hours. Anemia Panel: No results for input(s): VITAMINB12, FOLATE, FERRITIN, TIBC, IRON, RETICCTPCT in the last 72 hours. Sepsis Labs: No results for input(s): PROCALCITON, LATICACIDVEN in the last 168 hours.  No results found for this or any previous  visit (from the past 240 hour(s)).       Radiology Studies: CT ABDOMEN PELVIS W CONTRAST  Result Date: 05/01/2020 CLINICAL DATA:  Acute abdominal pain EXAM: CT ABDOMEN AND PELVIS WITH CONTRAST TECHNIQUE: Multidetector CT imaging of the abdomen and pelvis was performed using the standard protocol following bolus administration of intravenous contrast. CONTRAST:  168m OMNIPAQUE IOHEXOL 300 MG/ML  SOLN COMPARISON:  03/23/2020 FINDINGS: Lower chest: Trace dependent right pleural effusion. Lung bases otherwise clear. Normal heart size. No pericardial effusion. No hiatal hernia. Hepatobiliary: New hypodense hepatic lesions bilaterally. Left hepatic dome lesion measures 5.4 cm. Peripheral right hepatic lesion measures 2.4 cm. Anterior right hepatic dome lesion measures 1 cm. These are all measured on image 12 series 3. Lesions are compatible with new hepatic metastases. Focal fat noted along the falciform ligament anteriorly as before. No intrahepatic biliary dilatation. Hepatic and portal veins are patent. Gallbladder collapsed. Pancreas: Unremarkable. No pancreatic ductal dilatation or surrounding inflammatory changes. Spleen: Overall small in size. No focal abnormality. No parenchymal lesion. Adrenals/Urinary Tract: Normal adrenal glands. No renal obstruction or hydronephrosis. Posterior left upper pole renal cyst measures 8 cm. No hydroureter or urinary tract calculus. Bladder under distended. Stomach/Bowel: Diffuse hypodense transmural wall thickening of the stomach antrum, image 24 series 3 and image 10 series 8 suspicious for a gastric antral mass, predominant along the posterior wall roughly measuring 7.1 x 3.3 cm, image 9 series 8. Proximal stomach is distended. Difficult to exclude some component of partial gastric outlet obstruction. There is air and contrast within the small intestine more  distally. No small bowel obstruction pattern. No free air. No significant free fluid, ascites, hemorrhage,  hematoma or abscess. Vascular/Lymphatic: Infrarenal and aortic bifurcation atherosclerosis without aneurysm or dissection. No occlusive process. Mesenteric and renal vasculature appear patent. IVC filter noted. No bulky adenopathy. Reproductive: No significant finding by CT. Other: No inguinal or abdominal wall hernia. Musculoskeletal: Degenerative changes of the spine and lower lumbar facet arthropathy. 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 Consider GI consultation and repeat endoscopy. Aortic Atherosclerosis (ICD10-I70.0). These results will be called to the ordering clinician or representative by the Radiologist Assistant, and communication documented in the PACS or [employer]. Electronically Signed   By: Jerilynn Mages.  Shick M.D.   On: 05/01/2020 16:02        Scheduled Meds: . chlorhexidine gluconate (MEDLINE KIT)  15 mL Mouth Rinse BID  . feeding supplement (ENSURE ENLIVE)  237 mL Oral QID  . FLUoxetine  10 mg Oral QHS  . OXcarbazepine  75 mg Oral BID  . pantoprazole  40 mg Oral BID  . polyethylene glycol  17 g Oral Daily  . QUEtiapine  50 mg Oral BID   Continuous Infusions: . sodium chloride       LOS: 56 days     Cordelia Poche, MD [facility] 05/02/2020, 1:03 PM  If 7PM-7AM, please contact night-coverage [email or web address]
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
## 2020-05-02 NOTE — Consult Note (Signed)
Chief Complaint: Patient was seen in consultation today for liver lesion biopsy Chief Complaint  Patient presents with  . Cardiac Arrest   at the request of Dr Georgena Spurling  Supervising Physician: Corrie Mckusick  Patient Status: [facility] - In-pt  History of Present Illness: Kenneth Sullivan is a 64 y.o. 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 Consider GI consultation and repeat endoscopy.  Dr Lonny Prude has discussed with GI and they recommend biopsy in IR of liver lesion  Dr Earleen Newport has reviewed imaging and approves procedure    Past Medical History:  Diagnosis Date  . Cardiac arrest ([place]) 03/07/2020  . Known health problems: none    No recent medical care, has not had a physical in years    Past Surgical History:  Procedure Laterality Date  . ESOPHAGOGASTRODUODENOSCOPY (EGD) WITH PROPOFOL N/A 03/07/2020   Procedure: ESOPHAGOGASTRODUODENOSCOPY (EGD) WITH PROPOFOL;  Surgeon: Wilford Corner, MD;  Location: [place];  Service: Endoscopy;  Laterality: N/A;  . IR ANGIOGRAM PULMONARY BILATERAL SELECTIVE  03/07/2020  . IR ANGIOGRAM SELECTIVE EACH ADDITIONAL VESSEL  03/07/2020  . IR ANGIOGRAM SELECTIVE EACH ADDITIONAL VESSEL  03/07/2020  . IR ANGIOGRAM SELECTIVE EACH ADDITIONAL VESSEL  03/07/2020  . IR ANGIOGRAM VISCERAL SELECTIVE  03/07/2020  . IR EMBO ART  VEN HEMORR LYMPH EXTRAV  INC GUIDE ROADMAPPING  03/07/2020  . IR IVC FILTER PLMT / S&I /IMG GUID/MOD SED  03/07/2020  . IR THROMBECT PRIM MECH INIT (INCLU) MOD SED   03/07/2020  . IR US GUIDE VASC ACCESS RIGHT  03/07/2020  . IR US GUIDE VASC ACCESS RIGHT  03/07/2020  . IR US GUIDE VASC ACCESS RIGHT  03/07/2020    Allergies: Patient has no known allergies.  Medications: Prior to Admission medications   Not on File     Family History  Problem Relation Age of Onset  . Heart attack Neg Hx     Social History   Socioeconomic History  . Marital status: Single    Spouse name: Not on file  . Number of children: Not on file  . Years of education: Not on file  . Highest education level: Not on file  Occupational History  . Not on file  Tobacco Use  . Smoking status: Current Every Day Smoker    Packs/day: 1.00    Types: Cigarettes  . Smokeless tobacco: Never Used  Substance and Sexual Activity  . Alcohol use: Yes    Alcohol/week: 21.0 standard drinks    Types: 21 Cans of beer per week    Comment: 2 or 3  -40 ounce beers a day  . Drug use: Yes    Types: Marijuana  . Sexual activity: Not on file  Other Topics Concern  . Not on file  Social History Narrative   Lives with wife.  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   Social Determinants of Health   Financial Resource Strain:   . Difficulty of Paying Living Expenses:   Food Insecurity:   . Worried About Estate manager/land agent  of Food in the Last Year:   . [place] in the Last Year:   Transportation Needs:   . Lack of Transportation (Medical):   Marland Kitchen Lack of Transportation (Non-Medical):   Physical Activity:   . Days of Exercise per Week:   . Minutes of Exercise per Session:   Stress:   . Feeling of Stress :   Social Connections:   . Frequency of Communication with Friends and Family:   . Frequency of Social Gatherings with Friends and Family:   . Attends Religious Services:   . Active Member of Clubs or Organizations:   . Attends Archivist Meetings:   Marland Kitchen Marital Status:      Review of Systems: A 12 point ROS discussed and pertinent positives are indicated in the HPI  above.  All other systems are negative.  Review of Systems  Constitutional: Positive for appetite change. Negative for fatigue and fever.  Respiratory: Negative for cough and shortness of breath.   Gastrointestinal: Positive for abdominal pain and nausea.  Neurological: Positive for weakness.  Psychiatric/Behavioral: Positive for confusion. Negative for behavioral problems.    Vital Signs: BP (!) 82/65 (BP Location: Left Arm)   Pulse 92   Temp (!) 97.3 F (36.3 C)   Resp 20   Ht 6\' 2"  (1.88 m)   Wt 139 lb (63 kg)   SpO2 100%   BMI 17.85 kg/m   Physical Exam Vitals reviewed.  Cardiovascular:     Rate and Rhythm: Normal rate and regular rhythm.     Heart sounds: Normal heart sounds.  Pulmonary:     Breath sounds: Normal breath sounds.  Abdominal:     Palpations: Abdomen is soft.     Tenderness: There is abdominal tenderness.  Musculoskeletal:        General: Normal range of motion.  Skin:    General: Skin is warm.  Neurological:     Mental Status: He is alert. Mental status is at baseline.  Psychiatric:     Comments: Consented with [name]     Imaging: DG Abd 1 View  Result Date: 04/06/2020 CLINICAL DATA:  64 year old male status post feeding tube placement. EXAM: ABDOMEN - 1 VIEW COMPARISON:  No priors. FINDINGS: Single image demonstrates placement of a small bore feeding tube with tip in the antral pre-pyloric region of the stomach. Contrast was injected via the tube filling the dependent portion of the gastric lumen. Multiple embolization coils are noted in the right upper quadrant of the abdomen. IVC filter also noted. IMPRESSION: 1. Tip of feeding tube is in the antral pre-pyloric region of the stomach. Electronically Signed   By: Vinnie Langton M.D.   On: 04/06/2020 15:31   CT ABDOMEN PELVIS W CONTRAST  Result Date: 05/01/2020 CLINICAL DATA:  Acute abdominal pain EXAM: CT ABDOMEN AND PELVIS WITH CONTRAST TECHNIQUE: Multidetector CT imaging of the abdomen  and pelvis was performed using the standard protocol following bolus administration of intravenous contrast. CONTRAST:  135mL OMNIPAQUE IOHEXOL 300 MG/ML  SOLN COMPARISON:  03/23/2020 FINDINGS: Lower chest: Trace dependent right pleural effusion. Lung bases otherwise clear. Normal heart size. No pericardial effusion. No hiatal hernia. Hepatobiliary: New hypodense hepatic lesions bilaterally. Left hepatic dome lesion measures 5.4 cm. Peripheral right hepatic lesion measures 2.4 cm. Anterior right hepatic dome lesion measures 1 cm. These are all measured on image 12 series 3. Lesions are compatible with new hepatic metastases. Focal fat noted along the falciform ligament anteriorly as before.  No intrahepatic biliary dilatation. Hepatic and portal veins are patent. Gallbladder collapsed. Pancreas: Unremarkable. No pancreatic ductal dilatation or surrounding inflammatory changes. Spleen: Overall small in size. No focal abnormality. No parenchymal lesion. Adrenals/Urinary Tract: Normal adrenal glands. No renal obstruction or hydronephrosis. Posterior left upper pole renal cyst measures 8 cm. No hydroureter or urinary tract calculus. Bladder under distended. Stomach/Bowel: Diffuse hypodense transmural wall thickening of the stomach antrum, image 24 series 3 and image 10 series 8 suspicious for a gastric antral mass, predominant along the posterior wall roughly measuring 7.1 x 3.3 cm, image 9 series 8. Proximal stomach is distended. Difficult to exclude some component of partial gastric outlet obstruction. There is air and contrast within the small intestine more distally. No small bowel obstruction pattern. No free air. No significant free fluid, ascites, hemorrhage, hematoma or abscess. Vascular/Lymphatic: Infrarenal and aortic bifurcation atherosclerosis without aneurysm or dissection. No occlusive process. Mesenteric and renal vasculature appear patent. IVC filter noted. No bulky adenopathy. Reproductive: No  significant finding by CT. Other: No inguinal or abdominal wall hernia. Musculoskeletal: Degenerative changes of the spine and lower lumbar facet arthropathy. 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 Consider GI consultation and repeat endoscopy. Aortic Atherosclerosis (ICD10-I70.0). These results will be called to the ordering clinician or representative by the Radiologist Assistant, and communication documented in the PACS or [employer]. Electronically Signed   By: Jerilynn Mages.  Shick M.D.   On: 05/01/2020 16:02   DG CHEST PORT 1 VIEW  Result Date: 04/04/2020 CLINICAL DATA:  Leukocytosis EXAM: PORTABLE CHEST 1 VIEW COMPARISON:  03/25/2020 FINDINGS: The tracheostomy tube is in good position, unchanged. Feeding tube is coursing down the esophagus and into the stomach. The cardiac silhouette, mediastinal and hilar contours are within normal limits and stable. No acute pulmonary findings or pleural effusion. No pneumothorax. IMPRESSION: No acute cardiopulmonary findings. Electronically Signed   By: Marijo Sanes M.D.   On: 04/04/2020 08:56   DG Abd 2 Views  Result Date: 04/28/2020 CLINICAL DATA:  64 year old male with abdominal pain. History of GI bleeding treated with mesenteric embolization. EXAM: ABDOMEN - 2 VIEW COMPARISON:  Noncontrast CT Abdomen and Pelvis 03/23/2020. Abdominal radiographs 04/11/2020 and earlier. FINDINGS: Three supine views of the abdomen and pelvis. Negative lung bases. Enteric feeding tube removed from last month. Stable IVC filter and epigastric embolization coils. Non obstructed bowel gas pattern. Decreased retained stool in the large bowel from last month. No acute osseous abnormality identified. IMPRESSION: No acute findings. Non obstructed bowel gas pattern with decreased retained stool in the colon from last month. Electronically Signed   By: Genevie Ann M.D.   On: 04/28/2020 12:46   DG Abd Portable  1V  Result Date: 04/11/2020 CLINICAL DATA:  Feeding tube appears dislodged. EXAM: PORTABLE ABDOMEN - 1 VIEW COMPARISON:  04/06/2020 FINDINGS: Feeding tube enters the stomach but is coiled in the fundus, no longer having the tip in the region of the antrum. IVC filter and upper abdominal vascular coils remain in place. Moderate amount of stool. IMPRESSION: Feeding tube now looped in the fundus of the stomach. Electronically Signed   By: Nelson Chimes M.D.   On: 04/11/2020 18:16    Labs:  CBC: Recent Labs    04/24/20 0302 04/26/20 0325 04/28/20 0401 05/02/20 1137  WBC 6.5 6.4 6.6 6.9  HGB 9.7* 8.9* 9.2* 9.7*  HCT 29.4* 27.3* 29.0* 30.0*  PLT 256 278 338 398    COAGS:  Recent Labs    03/07/20 1243 03/08/20 0337  INR 1.3* 1.1  APTT 39*  --     BMP: Recent Labs    04/22/20 0330 04/24/20 0302 04/26/20 0325 04/28/20 0401  NA 140 141 141 140  K 4.1 3.8 4.0 3.7  CL 105 107 107 111  CO2 24 25 24 24   GLUCOSE 85 99 93 92  BUN 15 13 14 9   CALCIUM 8.8* 8.7* 8.3* 8.4*  CREATININE 0.73 0.84 0.89 0.75  GFRNONAA >60 >60 >60 >60  GFRAA >60 >60 >60 >60    LIVER FUNCTION TESTS: Recent Labs    03/28/20 0606 03/28/20 0606 03/30/20 0539 03/30/20 0539 04/02/20 1014 04/02/20 1014 04/05/20 0657 04/05/20 0657 04/14/20 0403 04/15/20 0555 04/16/20 0841 04/19/20 0808  BILITOT 1.6*  --  0.9  --  0.5  --  1.0  --   --   --   --   --   AST 58*  --  26  --  23  --  34  --   --   --   --   --   ALT 12  --  20  --  22  --  22  --   --   --   --   --   ALKPHOS 73  --  71  --  65  --  74  --   --   --   --   --   PROT 6.1*  --  6.4*  --  5.7*  --  6.1*  --   --   --   --   --   ALBUMIN 2.8*   < > 2.5*   < > 2.5*   < > 3.1*   < > 2.6* 3.3* 2.7* 3.0*   < > = values in this interval not displayed.    TUMOR MARKERS: No results for input(s): AFPTM, CEA, CA199, CHROMGRNA in the last 8760 hours.  Assessment and Plan:  Scheduled for liver lesion biopsy in IR tomorrow Risks and benefits  of liver lesion bx was discussed with the patient's niece [name] via phone and/or patient's family including, but not limited to bleeding, infection, damage to adjacent structures or low yield requiring additional tests.  All were answered and she is agreement to proceed. Consent signed and in chart.   Thank you for this interesting consult.  I greatly enjoyed meeting Kenneth Sullivan and look forward to participating in their care.  A copy of this report was sent to the requesting provider on this date.  Electronically Signed: Lavonia Drafts, PA-C 05/02/2020, 2:45 PM   I spent a total of 40 Minutes    in face to face in clinical consultation, greater than 50% of which was counseling/coordinating care for liver lesion biopsy

## 2020-05-02 NOTE — Progress Notes (Signed)
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

## 2020-05-03 ENCOUNTER — Other Ambulatory Visit: Payer: Self-pay | Admitting: Anatomic Pathology & Clinical Pathology

## 2020-05-03 ENCOUNTER — Inpatient Hospital Stay (HOSPITAL_COMMUNITY): Payer: Medicaid Other

## 2020-05-03 DIAGNOSIS — J9600 Acute respiratory failure, unspecified whether with hypoxia or hypercapnia: Secondary | ICD-10-CM

## 2020-05-03 LAB — CBC
HCT: 31.5 % — ABNORMAL LOW (ref 39.0–52.0)
Hemoglobin: 10 g/dL — ABNORMAL LOW (ref 13.0–17.0)
MCH: 29.6 pg (ref 26.0–34.0)
MCHC: 31.7 g/dL (ref 30.0–36.0)
MCV: 93.2 fL (ref 80.0–100.0)
Platelets: 408 10*3/uL — ABNORMAL HIGH (ref 150–400)
RBC: 3.38 MIL/uL — ABNORMAL LOW (ref 4.22–5.81)
RDW: 17.3 % — ABNORMAL HIGH (ref 11.5–15.5)
WBC: 7.2 10*3/uL (ref 4.0–10.5)
nRBC: 0 % (ref 0.0–0.2)

## 2020-05-03 LAB — GLUCOSE, CAPILLARY
Glucose-Capillary: 92 mg/dL (ref 70–99)
Glucose-Capillary: 69 mg/dL — ABNORMAL LOW (ref 70–99)
Glucose-Capillary: 118 mg/dL — ABNORMAL HIGH (ref 70–99)
Glucose-Capillary: 102 mg/dL — ABNORMAL HIGH (ref 70–99)
Glucose-Capillary: 86 mg/dL (ref 70–99)
Glucose-Capillary: 97 mg/dL (ref 70–99)

## 2020-05-03 LAB — COMPREHENSIVE METABOLIC PANEL
ALT: 11 U/L (ref 0–44)
AST: 18 U/L (ref 15–41)
Albumin: 2.3 g/dL — ABNORMAL LOW (ref 3.5–5.0)
Alkaline Phosphatase: 59 U/L (ref 38–126)
Anion gap: 11 (ref 5–15)
BUN: 12 mg/dL (ref 8–23)
CO2: 21 mmol/L — ABNORMAL LOW (ref 22–32)
Calcium: 8.6 mg/dL — ABNORMAL LOW (ref 8.9–10.3)
Chloride: 107 mmol/L (ref 98–111)
Creatinine, Ser: 0.79 mg/dL (ref 0.61–1.24)
GFR calc Af Amer: >60 mL/min (ref >60)
GFR calc non Af Amer: >60 mL/min (ref >60)
Glucose, Bld: 81 mg/dL (ref 70–99)
Potassium: 4.2 mmol/L (ref 3.5–5.1)
Sodium: 139 mmol/L (ref 135–145)
Total Bilirubin: 0.4 mg/dL (ref 0.3–1.2)
Total Protein: 5.3 g/dL — ABNORMAL LOW (ref 6.5–8.1)

## 2020-05-03 LAB — PROTIME-INR
INR: 1.1 (ref 0.8–1.2)
Prothrombin Time: 13.4 seconds (ref 11.4–15.2)

## 2020-05-03 MED ORDER — GELATIN ABSORBABLE 12-7 MM EX MISC
CUTANEOUS | Status: AC
  Filled 2020-05-03: qty 1

## 2020-05-03 MED ORDER — MIDAZOLAM HCL 2 MG/2ML IJ SOLN
INTRAMUSCULAR | Status: AC
  Filled 2020-05-03: qty 2

## 2020-05-03 MED ORDER — LIDOCAINE HCL (PF) 1 % IJ SOLN
INTRAMUSCULAR | Status: AC
  Filled 2020-05-03: qty 30

## 2020-05-03 MED ORDER — FENTANYL CITRATE (PF) 100 MCG/2ML IJ SOLN
INTRAMUSCULAR | Status: AC
  Filled 2020-05-03: qty 2

## 2020-05-03 NOTE — Progress Notes (Signed)
PROGRESS NOTE    Kenneth Sullivan  LAG:[id number] DOB: 1956-01-25 DOA: 03/07/2020 PCP: Medicine, [name]    Chief Complaint  Patient presents with  . Cardiac Arrest    Brief Narrative:  64 year old gentleman with no known prior history presents to ED with shortness of breath with hypoxia and a witnessed bradycardic event leading to asystole and cardiac arrest.  ROSC obtained in 3:20 rounds of epinephrine was brought into ED intubated and admitted to ICU by critical care team .  CT angiogram of the chest shows massive PE/DVT with ventricular strain s/p IR guided mechanical thrombectomy and IVC filter postprocedure while on the heparin drip patient started having maroon-colored red stools and bloody drainage from oral gastric tube.  GI consulted patient underwent endoscopy which shows gastric and ulcer oozing and blood clots.  IR consulted and he underwent GDA embolization.  As patient remained encephalopathic he underwent MRI of the brain showing multifocal acute to subacute ischemic infarcts involving bilateral cerebral hemispheres consistent with global hypoperfusion related to cardiac arrest and also associated with scattered petechial hemorrhage in the parietal lobe.  On 4/5 patient underwent tracheostomy.  Fortunately he did not require PEG tube at this time as his appetite improved and he is on oral diet.  CT abdomen and pelvis also showed a gastric lesion in the antrum concerning for an antral mass and multiple liver lesions concerning for metastatic disease.  IR consulted and he underwent a liver biopsy on 05/03/2020.  Patient will probably need oncology referral once the results are out. 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 Bowel regimen and pain control.    Massive pulmonary embolism S/p IR guided mechanical thrombectomy and IVC filter placement. Unable to anticoagulate secondary to hemorrhagic conversion of the stroke and acute GI bleeding.  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    Cardiac arrest probably secondary to massive PE.    Acute blood loss anemia probably secondary to upper GI bleed S/p upper EGD significant for obstructive oozing gastric ulcer. S/p IR guided GDA embolization. Transfuse to keep hemoglobin greater than 7 Continue with PPI and follow hemoglobin intermittently.   Dysphagia SLP evaluation recommending dysphagia diet.    Acute hypoxic arrest encephalopathy secondary to cardiac arrest. Improved    Delusions/hallucinations Psychiatric consulted and recommendations given. Continue with Seroquel, Prozac and Trileptal.     DVT prophylaxis: SCDs  code Status: DNR Family Communication: (None at bedside Disposition:   Status is: Inpatient  Remains inpatient appropriate because:Ongoing diagnostic testing needed not appropriate for outpatient work up and Unsafe d/c plan   Dispo: The patient is from: Home              Anticipated d/c is to: SNF              Anticipated d/c date is: 2 days  Patient currently is not medically stable to d/c.        Consultants:   IR  Neurology  PCCM  Gastroenterology  Procedures: EGD, IR guided GDA embolization. IR guided liver biopsy  Antimicrobials: None  Subjective: Wants to eat no chest pain, shortness of breath, nausea vomiting or abdominal  pain.  Objective: Vitals:   05/02/20 1541 05/02/20 1638 05/02/20 2305 05/03/20 1218  BP: (!) 86/64 (!) 88/62 (!) 83/51 106/75  Pulse: (!) 103 98 (!) 105 100  Resp: _0 Temp: (!) 97.5 F (36.4 C) (!) 97.5 F (36.4 C) 98.5 F (36.9 C) 97.9 F (36.6 C)  TempSrc:    Oral  SpO2: 96% 96% 92% 96%  Weight:      Height:        Intake/Output Summary (Last 24 hours) at 05/03/2020 1431 Last data filed at 05/02/2020 1542 Gross per 24 hour  Intake --  Output 100 ml  Net -100 ml   Filed Weights   04/29/20 1557 04/30/20 0500 05/01/20 0648  Weight: 63 kg 63.9 kg 63 kg    Examination:  General exam: Appears calm and comfortable  Respiratory system: Clear to auscultation. Respiratory effort normal. Cardiovascular system: S1 & S2 heard, RRR. No JVD,No pedal edema. Gastrointestinal system: Abdomen is nondistended, soft and nontender.  Normal bowel sounds heard. Central nervous system: Alert and oriented. No focal neurological deficits. Extremities: Symmetric 5 x 5 power. Skin: No rashes, lesions or ulcers Psychiatry:  Mood & affect appropriate.     Data Reviewed: I have personally reviewed following labs and imaging studies  CBC: Recent Labs  Lab 04/28/20 0401 05/02/20 1137 05/03/20 1130  WBC 6.6 6.9 7.2  HGB 9.2* 9.7* 10.0*  HCT 29.0* 30.0* 31.5*  MCV 94.2 92.0 93.2  PLT 338 398 408*    Basic Metabolic Panel: Recent Labs  Lab 04/28/20 0401 05/02/20 1404 05/03/20 1130  NA 140 138 139  K 3.7 4.0 4.2  CL 111 105 107  CO2 24 26 21*  GLUCOSE 92 90 81  BUN _1 CREATININE 0.75 0.89 0.79  CALCIUM 8.4* 8.6* 8.6*    GFR: Estimated Creatinine Clearance: 85.4 mL/min (by C-G formula based on SCr of 0.79 mg/dL).  Liver Function Tests: Recent Labs  Lab 05/02/20 1404 05/03/20 1130  AST 16 18  ALT 10 11  ALKPHOS 60 59  BILITOT 0.4 0.4  PROT 4.9* 5.3*  ALBUMIN 2.2* 2.3*    CBG: Recent Labs  Lab 05/02/20 2305 05/03/20 0731 05/03/20 1141  05/03/20 1241 05/03/20 1338  GLUCAP 234* 92 69* 118* 102*     No results found for this or any previous visit (from the past 240 hour(s)).       Radiology Studies: CT ABDOMEN PELVIS W CONTRAST  Result Date: 05/01/2020 CLINICAL DATA:  Acute abdominal pain EXAM: CT ABDOMEN AND PELVIS WITH CONTRAST TECHNIQUE: Multidetector CT imaging of the abdomen and pelvis was performed using the standard protocol following bolus administration of intravenous contrast. CONTRAST:  126m OMNIPAQUE IOHEXOL 300 MG/ML  SOLN COMPARISON:  03/23/2020 FINDINGS: Lower chest: Trace dependent right pleural effusion. Lung bases otherwise clear. Normal heart size. No pericardial effusion. No hiatal hernia. Hepatobiliary: New hypodense hepatic lesions bilaterally. Left hepatic dome lesion measures 5.4 cm. Peripheral right hepatic lesion measures 2.4 cm. Anterior right hepatic dome lesion measures 1 cm. These are all measured on image 12 series 3. Lesions are compatible with new hepatic metastases. Focal fat noted along the falciform  ligament anteriorly as before. No intrahepatic biliary dilatation. Hepatic and portal veins are patent. Gallbladder collapsed. Pancreas: Unremarkable. No pancreatic ductal dilatation or surrounding inflammatory changes. Spleen: Overall small in size. No focal abnormality. No parenchymal lesion. Adrenals/Urinary Tract: Normal adrenal glands. No renal obstruction or hydronephrosis. Posterior left upper pole renal cyst measures 8 cm. No hydroureter or urinary tract calculus. Bladder under distended. Stomach/Bowel: Diffuse hypodense transmural wall thickening of the stomach antrum, image 24 series 3 and image 10 series 8 suspicious for a gastric antral mass, predominant along the posterior wall roughly measuring 7.1 x 3.3 cm, image 9 series 8. Proximal stomach is distended. Difficult to exclude some component of partial gastric outlet obstruction. There is air and contrast within the small intestine more  distally. No small bowel obstruction pattern. No free air. No significant free fluid, ascites, hemorrhage, hematoma or abscess. Vascular/Lymphatic: Infrarenal and aortic bifurcation atherosclerosis without aneurysm or dissection. No occlusive process. Mesenteric and renal vasculature appear patent. IVC filter noted. No bulky adenopathy. Reproductive: No significant finding by CT. Other: No inguinal or abdominal wall hernia. Musculoskeletal: Degenerative changes of the spine and lower lumbar facet arthropathy. 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 Consider GI consultation and repeat endoscopy. Aortic Atherosclerosis (ICD10-I70.0). These results will be called to the ordering clinician or representative by the Radiologist Assistant, and communication documented in the PACS or [employer]. Electronically Signed   By: Jerilynn Mages.  Shick M.D.   On: 05/01/2020 16:02        Scheduled Meds: . chlorhexidine gluconate (MEDLINE KIT)  15 mL Mouth Rinse BID  . feeding supplement (ENSURE ENLIVE)  237 mL Oral QID  . fentaNYL      . FLUoxetine  10 mg Oral QHS  . gelatin adsorbable      . lidocaine (PF)      . midazolam      . OXcarbazepine  75 mg Oral BID  . pantoprazole  40 mg Oral BID  . polyethylene glycol  17 g Oral Daily  . QUEtiapine  50 mg Oral BID   Continuous Infusions: . sodium chloride       LOS: 57 days        Hosie Poisson, MD [facility]   To contact the attending provider between 7A-7P or the covering provider during after hours 7P-7A, please log into the web site [email or web address] and access using universal [place] password for that web site. If you do not have the password, please call the hospital operator.  05/03/2020, 2:31 PM

## 2020-05-03 NOTE — Procedures (Signed)
Interventional Radiology Procedure Note  Procedure: US guided biopsy of left liver mass.  .  Complications: None  Recommendations:  - Ok to shower tomorrow - Do not submerge for 7 days - Routine care - ok for diet per primary order   Signed,  Dulcy Fanny. Earleen Newport, DO

## 2020-05-04 ENCOUNTER — Encounter (HOSPITAL_COMMUNITY): Payer: Self-pay | Admitting: Pulmonary Disease

## 2020-05-04 DIAGNOSIS — K769 Liver disease, unspecified: Secondary | ICD-10-CM

## 2020-05-04 LAB — GLUCOSE, CAPILLARY
Glucose-Capillary: 86 mg/dL (ref 70–99)
Glucose-Capillary: 95 mg/dL (ref 70–99)
Glucose-Capillary: 106 mg/dL — ABNORMAL HIGH (ref 70–99)
Glucose-Capillary: 88 mg/dL (ref 70–99)

## 2020-05-04 NOTE — Progress Notes (Signed)
PROGRESS NOTE    Kenneth Sullivan  YKD:[id number] DOB: [birth year] DOA: 03/07/2020 PCP: Medicine, [name]    Chief Complaint  Patient presents with  . Cardiac Arrest    Brief Narrative:  64 year old gentleman with no known prior history presents to ED with shortness of breath with hypoxia and a witnessed bradycardic event leading to asystole and cardiac arrest.  ROSC obtained in 3:20 rounds of epinephrine was brought into ED intubated and admitted to ICU by critical care team .  CT angiogram of the chest shows massive PE/DVT with ventricular strain s/p IR guided mechanical thrombectomy and IVC filter postprocedure while on the heparin drip patient started having maroon-colored red stools and bloody drainage from oral gastric tube.  GI consulted patient underwent endoscopy which shows gastric and ulcer oozing and blood clots.  IR consulted and he underwent GDA embolization.  As patient remained encephalopathic he underwent MRI of the brain showing multifocal acute to subacute ischemic infarcts involving bilateral cerebral hemispheres consistent with global hypoperfusion related to cardiac arrest and also associated with scattered petechial hemorrhage in the parietal lobe.  On 4/5 patient underwent tracheostomy.  Fortunately he did not require PEG tube at this time as his appetite improved and he is on oral diet.  CT abdomen and pelvis also showed a gastric lesion in the antrum concerning for an antral mass and multiple liver lesions concerning for metastatic disease.  IR consulted and he underwent a liver biopsy on 05/03/2020.  Patient will probably need oncology referral once the results are out. PT/OT eval recommended SNF and TOC on board for SNF placement.   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 Oncology consulted for recommendations and outpatient follow up.  Bowel regimen and pain control.    Massive pulmonary embolism S/p IR guided mechanical thrombectomy and IVC filter placement. Unable to anticoagulate secondary to hemorrhagic conversion of the stroke and acute GI bleeding.  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 He is alert and able to hold conversation.     Cardiac arrest probably secondary to massive PE.    Acute blood loss anemia probably secondary to upper GI bleed S/p upper EGD significant for obstructive oozing gastric ulcer. S/p IR guided GDA embolization. Transfuse to keep hemoglobin greater than 7 Continue with PPI. No obvious signs of bleeding.    Dysphagia SLP evaluation recommending dysphagia diet. As per bedside NT, pt ate almost all his breakfast.     Acute hypoxic arrest encephalopathy secondary to cardiac arrest. Improved. Pt is on RA.     Delusions/hallucinations Psychiatric consulted and recommendations given. Continue with Seroquel, Prozac and Trileptal. None today. Continue with the sitter at bedside for safety.      DVT prophylaxis: SCDs  code Status: DNR Family Communication: None at bedside Disposition:   Status is: Inpatient  Remains inpatient appropriate because:Ongoing diagnostic testing needed not appropriate for outpatient work up and Unsafe d/c plan   Dispo: The patient is from: Home  Anticipated d/c is to: SNF              Anticipated d/c date is: 2 days              Patient currently is not medically stable to d/c.        Consultants:    IR  Neurology  PCCM  Gastroenterology  Procedures: EGD, IR guided GDA embolization. IR guided liver biopsy  Antimicrobials: None  Subjective: No chest pain or sob. No nausea or vomiting.   Objective: Vitals:   05/03/20 1218 05/03/20 1634 05/03/20 2249 05/04/20 0900  BP: 106/75 113/90 102/70 134/84  Pulse: 100 98 96 91  Resp: _0 Temp: 97.9 F (36.6 C) 99.1 F (37.3 C) 98.2 F (36.8 C) 98.4 F (36.9 C)  TempSrc: Oral  Oral Oral  SpO2: 96% 100% 94% 94%  Weight:      Height:        Intake/Output Summary (Last 24 hours) at 05/04/2020 1430 Last data filed at 05/04/2020 1121 Gross per 24 hour  Intake 60 ml  Output 650 ml  Net -590 ml   Filed Weights   04/29/20 1557 04/30/20 0500 05/01/20 0648  Weight: 63 kg 63.9 kg 63 kg    Examination:  General exam: Alert and comfortable not in any kind of distress Respiratory system: Clear to auscultation bilaterally, no wheezing or rhonchi Cardiovascular system: S1-S2 heard, regular rate rhythm, no JVD, no pedal edema. Gastrointestinal system: Abdomen is soft, nontender nondistended, bowel sounds normal Central nervous system: Alert and oriented to person, able to answer simple questions. Extremities: No pedal edema Skin: No rashes seen Psychiatry: Mood appropriate   Data Reviewed: I have personally reviewed following labs and imaging studies  CBC: Recent Labs  Lab 04/28/20 0401 05/02/20 1137 05/03/20 1130  WBC 6.6 6.9 7.2  HGB 9.2* 9.7* 10.0*  HCT 29.0* 30.0* 31.5*  MCV 94.2 92.0 93.2  PLT 338 398 408*    Basic Metabolic Panel: Recent Labs  Lab 04/28/20 0401 05/02/20 1404 05/03/20 1130  NA 140 138 139  K 3.7 4.0 4.2  CL 111 105 107  CO2 24 26 21*  GLUCOSE 92 90 81  BUN _1 CREATININE 0.75 0.89 0.79  CALCIUM 8.4* 8.6* 8.6*    GFR: Estimated Creatinine Clearance: 85.4 mL/min (by C-G formula based on SCr of 0.79 mg/dL).  Liver Function Tests: Recent Labs  Lab 05/02/20 1404  05/03/20 1130  AST 16 18  ALT 10 11  ALKPHOS 60 59  BILITOT 0.4 0.4  PROT 4.9* 5.3*  ALBUMIN 2.2* 2.3*    CBG: Recent Labs  Lab 05/03/20 1338 05/03/20 1700 05/03/20 2058 05/04/20 0738 05/04/20 1113  GLUCAP 102* 86 97 86 95     No results found for this or any previous visit (from the past 240 hour(s)).       Radiology Studies: US BIOPSY (LIVER)  Result Date: 05/03/2020 INDICATION: 64 year old male with multiple liver lesions and concern for metastatic disease EXAM: ULTRASOUND-GUIDED BIOPSY LEFT LIVER MASS MEDICATIONS: None. ANESTHESIA/SEDATION: Moderate (conscious) sedation was employed during this procedure. A total of Versed 2.0 mg and Fentanyl 100 mcg was administered intravenously. Moderate Sedation Time: 10 minutes. The patient's level of consciousness and vital signs were monitored continuously by radiology nursing throughout the procedure under my direct supervision. FLUOROSCOPY TIME:  None). COMPLICATIONS: None PROCEDURE: Informed written consent was obtained from the patient after a thorough discussion of the procedural risks, benefits and alternatives.  All questions were addressed. Maximal Sterile Barrier Technique was utilized including caps, mask, sterile gowns, sterile gloves, sterile drape, hand hygiene and skin antiseptic. A timeout was performed prior to the initiation of the procedure Ultrasound survey of the bilateral liver lobe performed with images stored and sent to PACs. The subxiphoid region of the abdomen was prepped with chlorhexidine in a sterile fashion, and a sterile drape was applied covering the operative field. A sterile gown and sterile gloves were used for the procedure. Local anesthesia was provided with 1% Lidocaine. Once the patient is prepped and draped sterilely and the skin and subcutaneous tissues were generously infiltrated with 1% lidocaine. A 17 gauge introducer needle was advanced into the left liver lobe targeting heterogeneously echoic mass  of the left liver. The stylet was removed, and multiple 18 gauge core biopsy were retrieved. Samples were placed into formalin for transportation to the lab. Gel-Foam pledgets were then infused with a small amount of saline for assistance with hemostasis. The needle was removed, and a final ultrasound image was performed. The patient tolerated the procedure well and remained hemodynamically stable throughout. No complications were encountered and no significant blood loss was encounter. IMPRESSION: Status post ultrasound-guided biopsy of left liver mass. Signed, Dulcy Fanny. Dellia Nims, RPVI [facility] Radiology Electronically Signed   By: Corrie Mckusick D.O.   On: 05/03/2020 16:16        Scheduled Meds: . chlorhexidine gluconate (MEDLINE KIT)  15 mL Mouth Rinse BID  . feeding supplement (ENSURE ENLIVE)  237 mL Oral QID  . FLUoxetine  10 mg Oral QHS  . OXcarbazepine  75 mg Oral BID  . pantoprazole  40 mg Oral BID  . polyethylene glycol  17 g Oral Daily  . QUEtiapine  50 mg Oral BID   Continuous Infusions: . sodium chloride       LOS: 58 days        Hosie Poisson, MD [facility]   To contact the attending provider between 7A-7P or the covering provider during after hours 7P-7A, please log into the web site [email or web address] and access using universal [place] password for that web site. If you do not have the password, please call the hospital operator.  05/04/2020, 2:30 PM

## 2020-05-04 NOTE — Plan of Care (Signed)
  Problem: Activity: Goal: Ability to tolerate increased activity will improve Outcome: Not Progressing   Problem: Role Relationship: Goal: Method of communication will improve Outcome: Not Progressing   Problem: Activity: Goal: Ability to tolerate increased activity will improve Outcome: Not Progressing   Problem: Role Relationship: Goal: Method of communication will improve Outcome: Not Progressing   Problem: Education: Goal: Knowledge of General Education information will improve Description: Including pain rating scale, medication(s)/side effects and non-pharmacologic comfort measures Outcome: Not Progressing   Problem: Health Behavior/Discharge Planning: Goal: Ability to manage health-related needs will improve Outcome: Not Progressing   Problem: Clinical Measurements: Goal: Will remain free from infection Outcome: Not Progressing Goal: Diagnostic test results will improve Outcome: Not Progressing Goal: Respiratory complications will improve Outcome: Not Progressing Goal: Cardiovascular complication will be avoided Outcome: Not Progressing   Problem: Nutrition: Goal: Adequate nutrition will be maintained Outcome: Not Progressing   Problem: Coping: Goal: Level of anxiety will decrease Outcome: Not Progressing   Problem: Elimination: Goal: Will not experience complications related to bowel motility Outcome: Not Progressing   Problem: Pain Managment: Goal: General experience of comfort will improve Outcome: Not Progressing   Problem: Safety: Goal: Ability to remain free from injury will improve Outcome: Not Progressing   Problem: Skin Integrity: Goal: Risk for impaired skin integrity will decrease Outcome: Not Progressing   Problem: Education: Goal: Knowledge of disease or condition will improve Outcome: Not Progressing Goal: Knowledge of secondary prevention will improve Outcome: Not Progressing Goal: Knowledge of patient specific risk factors  addressed and post discharge goals established will improve Outcome: Not Progressing Goal: Individualized Educational Video(s) Outcome: Not Progressing   Problem: Coping: Goal: Will verbalize positive feelings about self Outcome: Not Progressing Goal: Will identify appropriate support needs Outcome: Not Progressing   Problem: Health Behavior/Discharge Planning: Goal: Ability to manage health-related needs will improve Outcome: Not Progressing   Problem: Self-Care: Goal: Ability to participate in self-care as condition permits will improve Outcome: Not Progressing Goal: Verbalization of feelings and concerns over difficulty with self-care will improve Outcome: Not Progressing Goal: Ability to communicate needs accurately will improve Outcome: Not Progressing   Problem: Nutrition: Goal: Risk of aspiration will decrease Outcome: Not Progressing Goal: Dietary intake will improve Outcome: Not Progressing   Problem: Intracerebral Hemorrhage Tissue Perfusion: Goal: Complications of Intracerebral Hemorrhage will be minimized Outcome: Not Progressing   Problem: Ischemic Stroke/TIA Tissue Perfusion: Goal: Complications of ischemic stroke/TIA will be minimized Outcome: Not Progressing   Problem: Spontaneous Subarachnoid Hemorrhage Tissue Perfusion: Goal: Complications of Spontaneous Subarachnoid Hemorrhage will be minimized Outcome: Not Progressing

## 2020-05-04 NOTE — Consult Note (Addendum)
[place]  [phone] [phone]   [place]  Referral MD: Dr. Hosie Poisson  Reason for Referral: Stomach mass with suspected hepatic metastases  HPI: [name] is a 64 year old male with no significant past medical history.  Was brought to the hospital secondary to acute shortness of breath and had a witnessed bradycardic, asystolic arrest and received 3 rounds of epi, 20 minutes to ROSC.  Intubated and admitted to the ICU.  Studies performed on admission include a CTA of the chest which showed massive PE/DVT with a ventricular strain and he underwent an IR guided mechanical thrombectomy and IVC filter placement.  He developed maroon-colored stools and bloody drainage from the oral gastric tube and GI was consulted.  He had an EGD performed on 03/07/2020 which showed a normal esophagus and a partially obstructing oozing cratered gastric ulcer of significant severity with adherent clot at the incisura and in the gastric antrum. The lesion was 20 mm in largest dimension.  He initially had a CT of the abdomen pelvis without contrast performed on 03/23/2020 which showed that the gastric anatomy was amenable to potential percutaneous gastrostomy tube placement, embolization coils adjacent to the gastric antrum and proximal duodenum, and post IVC filter placement.  The patient developed acute abdominal pain on 05/01/2020 and a CT of the abdomen pelvis with contrast was performed which showed a distal stomach antral mass suspected with new hepatic metastases-gastric primary suspected.  He had an ultrasound-guided liver biopsy performed on 05/03/2020 and pathology results are currently pending.  Also of note, the patient underwent tracheostomy placement on 03/20/2020.  The patient is awaiting SNF placement.  The patient reports that prior to admission he had no medical issues.  His only complaint today is of abdominal pain that is intermittent.  He has  no reports of chest pain or shortness of breath.  Noted to have an intermittent cough during our visit.  Denies nausea and vomiting.  He states that he was going to the bathroom frequently the other day.  When asked further about this, he stated that he was not having diarrhea and could not clearly indicate what was making go to the bathroom so frequently.  He has not had any recurrent bleeding.  The patient will be discharging to a SNF for rehabilitation once medically stable.  Medical oncology was asked see the patient make recommendations regarding his gastric mass and likely hepatic diastases.   Past Medical History:  Diagnosis Date  . Cardiac arrest ([place]) 03/07/2020  . Known health problems: none    No recent medical care, has not had a physical in years  :  Past Surgical History:  Procedure Laterality Date  . ESOPHAGOGASTRODUODENOSCOPY (EGD) WITH PROPOFOL N/A 03/07/2020   Procedure: ESOPHAGOGASTRODUODENOSCOPY (EGD) WITH PROPOFOL;  Surgeon: Wilford Corner, MD;  Location: [place];  Service: Endoscopy;  Laterality: N/A;  . IR ANGIOGRAM PULMONARY BILATERAL SELECTIVE  03/07/2020  . IR ANGIOGRAM SELECTIVE EACH ADDITIONAL VESSEL  03/07/2020  . IR ANGIOGRAM SELECTIVE EACH ADDITIONAL VESSEL  03/07/2020  . IR ANGIOGRAM SELECTIVE EACH ADDITIONAL VESSEL  03/07/2020  . IR ANGIOGRAM VISCERAL SELECTIVE  03/07/2020  . IR EMBO ART  VEN HEMORR LYMPH EXTRAV  INC GUIDE ROADMAPPING  03/07/2020  . IR IVC FILTER PLMT / S&I /IMG GUID/MOD SED  03/07/2020  . IR THROMBECT PRIM MECH INIT (INCLU) MOD SED  03/07/2020  . IR US GUIDE VASC ACCESS RIGHT  03/07/2020  . IR US GUIDE VASC ACCESS RIGHT  03/07/2020  . IR US GUIDE VASC ACCESS RIGHT  03/07/2020  :  Current Facility-Administered Medications  Medication Dose Route Frequency Provider Last Rate Last Admin  . 0.9 %  sodium chloride infusion   Intravenous PRN Minor, Grace Bushy, NP      . acetaminophen (TYLENOL) tablet 650 mg  650 mg Oral Q6H PRN Mercy Riding, MD    650 mg at 04/26/20 2102  . chlorhexidine gluconate (MEDLINE KIT) (PERIDEX) 0.12 % solution 15 mL  15 mL Mouth Rinse BID Minor, Grace Bushy, NP   15 mL at 05/04/20 0845  . docusate sodium (COLACE) capsule 100 mg  100 mg Oral BID PRN Minor, Grace Bushy, NP   100 mg at 04/30/20 2202  . feeding supplement (ENSURE ENLIVE) (ENSURE ENLIVE) liquid 237 mL  237 mL Oral QID Wendee Beavers T, MD   237 mL at 05/04/20 1247  . FLUoxetine (PROZAC) capsule 10 mg  10 mg Oral QHS Suella Broad, FNP   10 mg at 05/03/20 2223  . guaiFENesin-dextromethorphan (ROBITUSSIN DM) 100-10 MG/5ML syrup 10 mL  10 mL Oral Q4H PRN Gonfa, Taye T, MD      . hydrALAZINE (APRESOLINE) injection 5 mg  5 mg Intravenous Q8H PRN Hosie Poisson, MD      . ipratropium-albuterol (DUONEB) 0.5-2.5 (3) MG/3ML nebulizer solution 3 mL  3 mL Nebulization Q6H PRN Chesley Mires, MD      . LORazepam (ATIVAN) injection 1-2 mg  1-2 mg Intravenous Q6H PRN Regalado, Belkys A, MD   2 mg at 05/03/20 1704  . LORazepam (ATIVAN) tablet 2 mg  2 mg Oral Q6H PRN Regalado, Belkys A, MD   2 mg at 05/03/20 0814  . OXcarbazepine (TRILEPTAL) tablet 75 mg  75 mg Oral BID Suella Broad, FNP   75 mg at 05/04/20 0839  . oxyCODONE (Oxy IR/ROXICODONE) immediate release tablet 5 mg  5 mg Oral Q6H PRN Wendee Beavers T, MD   5 mg at 05/02/20 1042  . pantoprazole (PROTONIX) EC tablet 40 mg  40 mg Oral BID Wendee Beavers T, MD   40 mg at 05/04/20 9628  . polyethylene glycol (MIRALAX / GLYCOLAX) packet 17 g  17 g Oral Daily Mariel Aloe, MD   17 g at 05/04/20 3662  . QUEtiapine (SEROQUEL) tablet 50 mg  50 mg Oral BID Regalado, Belkys A, MD   50 mg at 05/04/20 0838  . sodium chloride flush (NS) 0.9 % injection 10-40 mL  10-40 mL Intracatheter PRN Minor, Grace Bushy, NP   10 mL at 05/01/20 2219     No Known Allergies:  Family History  Problem Relation Age of Onset  . Heart attack Neg Hx   :  Social History   Socioeconomic History  . Marital status: Single    Spouse  name: Not on file  . Number of children: Not on file  . Years of education: Not on file  . Highest education level: Not on file  Occupational History  . Not on file  Tobacco Use  . Smoking status: Current Every Day Smoker    Packs/day: 1.00    Types: Cigarettes  . Smokeless tobacco: Never Used  Substance and Sexual Activity  . Alcohol use: Yes    Alcohol/week: 21.0 standard drinks    Types: 21 Cans of beer per week    Comment: 2 or 3  -40 ounce beers a day  . Drug use: Yes    Types: Marijuana  . Sexual  activity: Not on file  Other Topics Concern  . Not on file  Social History Narrative   Lives with wife.  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   Social Determinants of Health   Financial Resource Strain:   . Difficulty of Paying Living Expenses:   Food Insecurity:   . Worried About Charity fundraiser in the Last Year:   . Arboriculturist in the Last Year:   Transportation Needs:   . Film/video editor (Medical):   Marland Kitchen Lack of Transportation (Non-Medical):   Physical Activity:   . Days of Exercise per Week:   . Minutes of Exercise per Session:   Stress:   . Feeling of Stress :   Social Connections:   . Frequency of Communication with Friends and Family:   . Frequency of Social Gatherings with Friends and Family:   . Attends Religious Services:   . Active Member of Clubs or Organizations:   . Attends Archivist Meetings:   Marland Kitchen Marital Status:   Intimate Partner Violence:   . Fear of Current or Ex-Partner:   . Emotionally Abused:   Marland Kitchen Physically Abused:   . Sexually Abused:   :  Review of Systems: A comprehensive 14 point review of systems was negative except as noted in the HPI.  Exam: Patient Vitals for the past 24 hrs:  BP Temp Temp src Pulse Resp SpO2  05/04/20 0900 134/84 98.4 F (36.9 C) Oral 91 20 94 %  05/03/20 2249 102/70 98.2 F (36.8 C) Oral 96 20 94 %  05/03/20 1634 113/90 99.1 F (37.3 C) - 98 19 100 %    General: Awake and  alert, thin appearing Eyes:  no scleral icterus.   ENT:  There were no oropharyngeal lesions.   Neck: Well-healed tracheostomy scar Lymphatics:  Negative cervical, supraclavicular, axillary, or inguinal nodes Respiratory: Diminished breath sounds bilaterally Cardiovascular:  Regular rate and rhythm, S1/S2, without murmur, rub or gallop.  There was no pedal edema.   GI:  abdomen was soft, flat, tenderness with palpation in the mid abdomen, nondistended, without organomegaly.  No mass.   Musculoskeletal:  no spinal tenderness of palpation of vertebral spine.   Skin exam was without echymosis, petichae.   Neuro exam was nonfocal. Patient was alert and oriented.  Confused with some questions and answered inappropriately at times.   Lab Results  Component Value Date   WBC 7.2 05/03/2020   HGB 10.0 (L) 05/03/2020   HCT 31.5 (L) 05/03/2020   PLT 408 (H) 05/03/2020   GLUCOSE 81 05/03/2020   CHOL 79 03/13/2020   TRIG 197 (H) 03/15/2020   HDL 39 (L) 03/13/2020   LDLCALC 31 03/13/2020   ALT 11 05/03/2020   AST 18 05/03/2020   NA 139 05/03/2020   K 4.2 05/03/2020   CL 107 05/03/2020   CREATININE 0.79 05/03/2020   BUN 12 05/03/2020   CO2 21 (L) 05/03/2020    DG Abd 1 View  Result Date: 04/06/2020 CLINICAL DATA:  64 year old male status post feeding tube placement. EXAM: ABDOMEN - 1 VIEW COMPARISON:  No priors. FINDINGS: Single image demonstrates placement of a small bore feeding tube with tip in the antral pre-pyloric region of the stomach. Contrast was injected via the tube filling the dependent portion of the gastric lumen. Multiple embolization coils are noted in the right upper quadrant of the abdomen. IVC filter also noted. IMPRESSION: 1. Tip of feeding tube is in the antral  pre-pyloric region of the stomach. Electronically Signed   By: Vinnie Langton M.D.   On: 04/06/2020 15:31   CT ABDOMEN PELVIS W CONTRAST  Result Date: 05/01/2020 CLINICAL DATA:  Acute abdominal pain EXAM: CT  ABDOMEN AND PELVIS WITH CONTRAST TECHNIQUE: Multidetector CT imaging of the abdomen and pelvis was performed using the standard protocol following bolus administration of intravenous contrast. CONTRAST:  175m OMNIPAQUE IOHEXOL 300 MG/ML  SOLN COMPARISON:  03/23/2020 FINDINGS: Lower chest: Trace dependent right pleural effusion. Lung bases otherwise clear. Normal heart size. No pericardial effusion. No hiatal hernia. Hepatobiliary: New hypodense hepatic lesions bilaterally. Left hepatic dome lesion measures 5.4 cm. Peripheral right hepatic lesion measures 2.4 cm. Anterior right hepatic dome lesion measures 1 cm. These are all measured on image 12 series 3. Lesions are compatible with new hepatic metastases. Focal fat noted along the falciform ligament anteriorly as before. No intrahepatic biliary dilatation. Hepatic and portal veins are patent. Gallbladder collapsed. Pancreas: Unremarkable. No pancreatic ductal dilatation or surrounding inflammatory changes. Spleen: Overall small in size. No focal abnormality. No parenchymal lesion. Adrenals/Urinary Tract: Normal adrenal glands. No renal obstruction or hydronephrosis. Posterior left upper pole renal cyst measures 8 cm. No hydroureter or urinary tract calculus. Bladder under distended. Stomach/Bowel: Diffuse hypodense transmural wall thickening of the stomach antrum, image 24 series 3 and image 10 series 8 suspicious for a gastric antral mass, predominant along the posterior wall roughly measuring 7.1 x 3.3 cm, image 9 series 8. Proximal stomach is distended. Difficult to exclude some component of partial gastric outlet obstruction. There is air and contrast within the small intestine more distally. No small bowel obstruction pattern. No free air. No significant free fluid, ascites, hemorrhage, hematoma or abscess. Vascular/Lymphatic: Infrarenal and aortic bifurcation atherosclerosis without aneurysm or dissection. No occlusive process. Mesenteric and renal  vasculature appear patent. IVC filter noted. No bulky adenopathy. Reproductive: No significant finding by CT. Other: No inguinal or abdominal wall hernia. Musculoskeletal: Degenerative changes of the spine and lower lumbar facet arthropathy. 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 Consider GI consultation and repeat endoscopy. Aortic Atherosclerosis (ICD10-I70.0). These results will be called to the ordering clinician or representative by the Radiologist Assistant, and communication documented in the PACS or [employer] Electronically Signed   By: MJerilynn Mages  Shick M.D.   On: 05/01/2020 16:02   [place]OPSY (LIVER)  Result Date: 05/03/2020 INDICATION: 64year old male with multiple liver lesions and concern for metastatic disease EXAM: ULTRASOUND-GUIDED BIOPSY LEFT LIVER MASS MEDICATIONS: None. ANESTHESIA/SEDATION: Moderate (conscious) sedation was employed during this procedure. A total of Versed 2.0 mg and Fentanyl 100 mcg was administered intravenously. Moderate Sedation Time: 10 minutes. The patient's level of consciousness and vital signs were monitored continuously by radiology nursing throughout the procedure under my direct supervision. FLUOROSCOPY TIME:  None). COMPLICATIONS: None PROCEDURE: Informed written consent was obtained from the patient after a thorough discussion of the procedural risks, benefits and alternatives. All questions were addressed. Maximal Sterile Barrier Technique was utilized including caps, mask, sterile gowns, sterile gloves, sterile drape, hand hygiene and skin antiseptic. A timeout was performed prior to the initiation of the procedure Ultrasound survey of the bilateral liver lobe performed with images stored and sent to PACs. The subxiphoid region of the abdomen was prepped with chlorhexidine in a sterile fashion, and a sterile drape was applied covering the operative field. A sterile gown and  sterile gloves were used for the procedure. Local anesthesia  was provided with 1% Lidocaine. Once the patient is prepped and draped sterilely and the skin and subcutaneous tissues were generously infiltrated with 1% lidocaine. A 17 gauge introducer needle was advanced into the left liver lobe targeting heterogeneously echoic mass of the left liver. The stylet was removed, and multiple 18 gauge core biopsy were retrieved. Samples were placed into formalin for transportation to the lab. Gel-Foam pledgets were then infused with a small amount of saline for assistance with hemostasis. The needle was removed, and a final ultrasound image was performed. The patient tolerated the procedure well and remained hemodynamically stable throughout. No complications were encountered and no significant blood loss was encounter. IMPRESSION: Status post ultrasound-guided biopsy of left liver mass. Signed, Dulcy Fanny. Dellia Nims, RPVI [facility] Radiology Electronically Signed   By: Corrie Mckusick D.O.   On: 05/03/2020 16:16   DG Abd 2 Views  Result Date: 04/28/2020 CLINICAL DATA:  64 year old male with abdominal pain. History of GI bleeding treated with mesenteric embolization. EXAM: ABDOMEN - 2 VIEW COMPARISON:  Noncontrast CT Abdomen and Pelvis 03/23/2020. Abdominal radiographs 04/11/2020 and earlier. FINDINGS: Three supine views of the abdomen and pelvis. Negative lung bases. Enteric feeding tube removed from last month. Stable IVC filter and epigastric embolization coils. Non obstructed bowel gas pattern. Decreased retained stool in the large bowel from last month. No acute osseous abnormality identified. IMPRESSION: No acute findings. Non obstructed bowel gas pattern with decreased retained stool in the colon from last month. Electronically Signed   By: Genevie Ann M.D.   On: 04/28/2020 12:46   DG Abd Portable 1V  Result Date: 04/11/2020 CLINICAL DATA:  Feeding tube appears  dislodged. EXAM: PORTABLE ABDOMEN - 1 VIEW COMPARISON:  04/06/2020 FINDINGS: Feeding tube enters the stomach but is coiled in the fundus, no longer having the tip in the region of the antrum. IVC filter and upper abdominal vascular coils remain in place. Moderate amount of stool. IMPRESSION: Feeding tube now looped in the fundus of the stomach. Electronically Signed   By: Nelson Chimes M.D.   On: 04/11/2020 18:16     DG Abd 1 View  Result Date: 04/06/2020 CLINICAL DATA:  64 year old male status post feeding tube placement. EXAM: ABDOMEN - 1 VIEW COMPARISON:  No priors. FINDINGS: Single image demonstrates placement of a small bore feeding tube with tip in the antral pre-pyloric region of the stomach. Contrast was injected via the tube filling the dependent portion of the gastric lumen. Multiple embolization coils are noted in the right upper quadrant of the abdomen. IVC filter also noted. IMPRESSION: 1. Tip of feeding tube is in the antral pre-pyloric region of the stomach. Electronically Signed   By: Vinnie Langton M.D.   On: 04/06/2020 15:31   CT ABDOMEN PELVIS W CONTRAST  Result Date: 05/01/2020 CLINICAL DATA:  Acute abdominal pain EXAM: CT ABDOMEN AND PELVIS WITH CONTRAST TECHNIQUE: Multidetector CT imaging of the abdomen and pelvis was performed using the standard protocol following bolus administration of intravenous contrast. CONTRAST:  133m OMNIPAQUE IOHEXOL 300 MG/ML  SOLN COMPARISON:  03/23/2020 FINDINGS: Lower chest: Trace dependent right pleural effusion. Lung bases otherwise clear. Normal heart size. No pericardial effusion. No hiatal hernia. Hepatobiliary: New hypodense hepatic lesions bilaterally. Left hepatic dome lesion measures 5.4 cm. Peripheral right hepatic lesion measures 2.4 cm. Anterior right hepatic dome lesion measures 1 cm. These are all measured on image 12 series 3. Lesions are compatible with new hepatic metastases. Focal fat noted  along the falciform ligament anteriorly as  before. No intrahepatic biliary dilatation. Hepatic and portal veins are patent. Gallbladder collapsed. Pancreas: Unremarkable. No pancreatic ductal dilatation or surrounding inflammatory changes. Spleen: Overall small in size. No focal abnormality. No parenchymal lesion. Adrenals/Urinary Tract: Normal adrenal glands. No renal obstruction or hydronephrosis. Posterior left upper pole renal cyst measures 8 cm. No hydroureter or urinary tract calculus. Bladder under distended. Stomach/Bowel: Diffuse hypodense transmural wall thickening of the stomach antrum, image 24 series 3 and image 10 series 8 suspicious for a gastric antral mass, predominant along the posterior wall roughly measuring 7.1 x 3.3 cm, image 9 series 8. Proximal stomach is distended. Difficult to exclude some component of partial gastric outlet obstruction. There is air and contrast within the small intestine more distally. No small bowel obstruction pattern. No free air. No significant free fluid, ascites, hemorrhage, hematoma or abscess. Vascular/Lymphatic: Infrarenal and aortic bifurcation atherosclerosis without aneurysm or dissection. No occlusive process. Mesenteric and renal vasculature appear patent. IVC filter noted. No bulky adenopathy. Reproductive: No significant finding by CT. Other: No inguinal or abdominal wall hernia. Musculoskeletal: Degenerative changes of the spine and lower lumbar facet arthropathy. 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 Consider GI consultation and repeat endoscopy. Aortic Atherosclerosis (ICD10-I70.0). These results will be called to the ordering clinician or representative by the Radiologist Assistant, and communication documented in the PACS or [employer]. Electronically Signed   By: Jerilynn Mages.  Shick M.D.   On: 05/01/2020 16:02   US BIOPSY (LIVER)  Result Date: 05/03/2020 INDICATION: 64 year old male with multiple liver lesions  and concern for metastatic disease EXAM: ULTRASOUND-GUIDED BIOPSY LEFT LIVER MASS MEDICATIONS: None. ANESTHESIA/SEDATION: Moderate (conscious) sedation was employed during this procedure. A total of Versed 2.0 mg and Fentanyl 100 mcg was administered intravenously. Moderate Sedation Time: 10 minutes. The patient's level of consciousness and vital signs were monitored continuously by radiology nursing throughout the procedure under my direct supervision. FLUOROSCOPY TIME:  None). COMPLICATIONS: None PROCEDURE: Informed written consent was obtained from the patient after a thorough discussion of the procedural risks, benefits and alternatives. All questions were addressed. Maximal Sterile Barrier Technique was utilized including caps, mask, sterile gowns, sterile gloves, sterile drape, hand hygiene and skin antiseptic. A timeout was performed prior to the initiation of the procedure Ultrasound survey of the bilateral liver lobe performed with images stored and sent to PACs. The subxiphoid region of the abdomen was prepped with chlorhexidine in a sterile fashion, and a sterile drape was applied covering the operative field. A sterile gown and sterile gloves were used for the procedure. Local anesthesia was provided with 1% Lidocaine. Once the patient is prepped and draped sterilely and the skin and subcutaneous tissues were generously infiltrated with 1% lidocaine. A 17 gauge introducer needle was advanced into the left liver lobe targeting heterogeneously echoic mass of the left liver. The stylet was removed, and multiple 18 gauge core biopsy were retrieved. Samples were placed into formalin for transportation to the lab. Gel-Foam pledgets were then infused with a small amount of saline for assistance with hemostasis. The needle was removed, and a final ultrasound image was performed. The patient tolerated the procedure well and remained hemodynamically stable throughout. No complications were encountered and no  significant blood loss was encounter. IMPRESSION: Status post ultrasound-guided biopsy of left liver mass. Signed, Dulcy Fanny. Dellia Nims, [place] [facility] Radiology Electronically Signed   By: York Cerise  Earleen Newport D.O.   On: 05/03/2020 16:16   DG Abd 2 Views  Result Date: 04/28/2020 CLINICAL DATA:  64 year old male with abdominal pain. History of GI bleeding treated with mesenteric embolization. EXAM: ABDOMEN - 2 VIEW COMPARISON:  Noncontrast CT Abdomen and Pelvis 03/23/2020. Abdominal radiographs 04/11/2020 and earlier. FINDINGS: Three supine views of the abdomen and pelvis. Negative lung bases. Enteric feeding tube removed from last month. Stable IVC filter and epigastric embolization coils. Non obstructed bowel gas pattern. Decreased retained stool in the large bowel from last month. No acute osseous abnormality identified. IMPRESSION: No acute findings. Non obstructed bowel gas pattern with decreased retained stool in the colon from last month. Electronically Signed   By: Genevie Ann M.D.   On: 04/28/2020 12:46   DG Abd Portable 1V  Result Date: 04/11/2020 CLINICAL DATA:  Feeding tube appears dislodged. EXAM: PORTABLE ABDOMEN - 1 VIEW COMPARISON:  04/06/2020 FINDINGS: Feeding tube enters the stomach but is coiled in the fundus, no longer having the tip in the region of the antrum. IVC filter and upper abdominal vascular coils remain in place. Moderate amount of stool. IMPRESSION: Feeding tube now looped in the fundus of the stomach. Electronically Signed   By: Nelson Chimes M.D.   On: 04/11/2020 18:16   Assessment and Plan:  1.  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  Tracheostomy placement on 03/20/2020, now healed 4.  Cardiac arrest on 03/07/2020 5.  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  History of acute  blood loss anemia likely due to upper GI bleed 7.  Delusions/hallucinations  [name] is a 64 year old male with no significant medical history who was admitted following cardiac arrest.  He has had a lengthy hospitalization and has had multiple procedures performed including an upper endoscopy which showed a partially obstructing gastric ulcer.  Due to increased abdominal pain, he had a CT of the abdomen pelvis which showed a gastric mass and concern for hepatic metastases.  He underwent a liver biopsy by interventional radiology on 05/03/2020.  Pathology results are currently pending.  We discussed the imaging findings and concern for underlying malignancy.  Discussed with the patient that we are awaiting the biopsy results which will communicate to the patient once we have them and also to his family per his request.  Once we have this information, we can determine treatment options.  Recommendations: 1.  Await liver biopsy results. 2.  We will arrange for outpatient follow-up at the cancer center to discuss treatment options.  Thank you for this referral.   Kenneth Bussing, DNP, AGPCNP-BC, AOCNP  Mr. Diemer was interviewed and examined.  He was admitted with a cardiac arrest secondary to pulmonary embolism.  A CT abdomen/pelvis on 05/01/2020 revealed a gastric antral mass and liver metastases.  He underwent an ultrasound-guided biopsy of a left liver mass yesterday.  The pathology is pending.  I reviewed the clinical history and CT images.  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  Mr. Tillett remains debilitated after a very lengthy hospital admission.  He appears to have confusion, likely related to anoxic brain injury.  We will follow up on the biopsy result and make treatment recommendations.  We will discuss systemic treatment options and hospice care depending on his performance status following discharge from the hospital.  Julieanne Manson, MD

## 2020-05-04 NOTE — Progress Notes (Signed)
Occupational Therapy Treatment Patient Details Name: Kenneth Sullivan MRN: [id number] DOB: 21-Aug-1956 Today's Date: 05/04/2020    History of present illness Pt is 64 yo male with unknown medical hx.  He presented to ED on 3/23 with acute SHOB.  He had bradycardiac event that lead to asystole/cardiac arrest (ROSC achieved in 20 mins with 3 rounds epinephrine).  Pt was intubated and admitted to ICU.  Pt with massive PE/DVT and RV strain.  He underwent IR guided mechanical thrombectomy and IVC Filter.   Pt developed gastric ulcer and required GDA embolization.  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 Pt s/p trach on 4/5 and changed to cuffless on 4/22--decannualted 4/28   OT comments  Patient cognition limiting participation.  Today he changed his mind several times on whether he would participate, ultimately refusing most therapy.  Was able to persuade him to complete some exercise at bed level.  Educated patient on the importance of moving for overall health and strength.  Will continue to follow with OT acutely to address the deficits listed below.    Follow Up Recommendations  SNF;Supervision/Assistance - 24 hour    Equipment Recommendations  Other (comment)    Recommendations for Other Services      Precautions / Restrictions Precautions Precautions: Fall Restrictions Weight Bearing Restrictions: No       Mobility Bed Mobility Overal bed mobility: Needs Assistance             General bed mobility comments: Refused  Transfers                 General transfer comment: Pt refused, getting increasingly irritated with attempts at mobility.    Balance                                           ADL either  performed or assessed with clinical judgement   ADL Overall ADL's : Needs assistance/impaired                                       General ADL Comments: Cognitive status remains greatest impairment limiting participation and function     Vision       Perception     Praxis      Cognition Arousal/Alertness: Awake/alert Behavior During Therapy: Restless;Agitated Overall Cognitive Status: No family/caregiver present to determine baseline cognitive functioning Area of Impairment: Orientation;Attention;Following commands;Memory;Safety/judgement;Problem solving                 Orientation Level: Disoriented to;Time;Situation Current Attention Level: Focused Memory: Decreased recall of precautions;Decreased short-term memory Following Commands: Follows one step commands with increased time;Follows one step commands inconsistently Safety/Judgement: Decreased awareness of deficits;Decreased awareness of safety Awareness: Intellectual Problem Solving: Difficulty sequencing;Requires tactile cues;Requires verbal cues;Decreased initiation;Slow processing General Comments: Patient would agree to participate then refuse right after, then agree then refuse...almost as though he forgot he just agreed.  Was not aware of situation.          Exercises Exercises: General Upper Extremity;General Lower Extremity General Exercises - Upper Extremity Elbow Flexion: AROM;5 reps;Both General Exercises - Lower Extremity Hip ABduction/ADduction: AROM;Both;5  reps;Supine Straight Leg Raises: AROM;Both;5 reps;Supine   Shoulder Instructions       General Comments      Pertinent Vitals/ Pain       Pain Assessment: 0-10 Pain Score: 5  Pain Location: stomach Pain Descriptors / Indicators: Sore Pain Intervention(s): Monitored during session  Home Living                                          Prior Functioning/Environment              Frequency  Min  2X/week        Progress Toward Goals  OT Goals(current goals can now be found in the care plan section)  Progress towards OT goals: Not progressing toward goals - comment(Not participating)  Acute Rehab OT Goals Patient Stated Goal: Pt unable OT Goal Formulation: Patient unable to participate in goal setting Time For Goal Achievement: 05/18/20 Potential to Achieve Goals: [place] Discharge plan remains appropriate    Co-evaluation                 AM-PAC OT "6 Clicks" Daily Activity     Outcome Measure   Help from another person eating meals?: A Lot Help from another person taking care of personal grooming?: A Lot Help from another person toileting, which includes using toliet, bedpan, or urinal?: A Lot Help from another person bathing (including washing, rinsing, drying)?: A Lot Help from another person to put on and taking off regular upper body clothing?: A Lot Help from another person to put on and taking off regular lower body clothing?: A Lot 6 Click Score: 12    End of Session    OT Visit Diagnosis: Unsteadiness on feet (R26.81);Other abnormalities of gait and mobility (R26.89);Muscle weakness (generalized) (M62.81);Low vision, both eyes (H54.2);Other symptoms and signs involving cognitive function   Activity Tolerance Treatment limited secondary to agitation;Other (comment)(Cognition limiitng)   Patient Left in bed;with call bell/phone within reach;with nursing/sitter in room   Nurse Communication Mobility status        Time: [id number] OT Time Calculation (min): 13 min  Charges: OT General Charges $OT Visit: 1 Visit OT Treatments $Therapeutic Activity: 8-22 mins  August Luz, OTR/L    Phylliss Bob 05/04/2020, 1:52 PM

## 2020-05-04 NOTE — Progress Notes (Signed)
Pt had male sitter per shift. Pt did well with no update of behavior issues.

## 2020-05-05 LAB — GLUCOSE, CAPILLARY
Glucose-Capillary: 49 mg/dL — ABNORMAL LOW (ref 70–99)
Glucose-Capillary: 128 mg/dL — ABNORMAL HIGH (ref 70–99)
Glucose-Capillary: 67 mg/dL — ABNORMAL LOW (ref 70–99)
Glucose-Capillary: 81 mg/dL (ref 70–99)
Glucose-Capillary: 96 mg/dL (ref 70–99)

## 2020-05-05 NOTE — Progress Notes (Signed)
PROGRESS NOTE    Kenneth Sullivan  IRS:[id number] DOB: Apr 22, 1956 DOA: 03/07/2020 PCP: Medicine, [name]    Chief Complaint  Patient presents with  . Cardiac Arrest    Brief Narrative:  64 year old gentleman with no known prior history presents to ED with shortness of breath with hypoxia and a witnessed bradycardic event leading to asystole and cardiac arrest.  ROSC obtained in 3:20 rounds of epinephrine was brought into ED intubated and admitted to ICU by critical care team .  CT angiogram of the chest shows massive PE/DVT with ventricular strain s/p IR guided mechanical thrombectomy and IVC filter postprocedure while on the heparin drip patient started having maroon-colored red stools and bloody drainage from oral gastric tube.  GI consulted patient underwent endoscopy which shows gastric and ulcer oozing and blood clots.  IR consulted and he underwent GDA embolization.  As patient remained encephalopathic he underwent MRI of the brain showing multifocal acute to subacute ischemic infarcts involving bilateral cerebral hemispheres consistent with global hypoperfusion related to cardiac arrest and also associated with scattered petechial hemorrhage in the parietal lobe.  On 4/5 patient underwent tracheostomy.  Fortunately he did not require PEG tube at this time as his appetite improved and he is on oral diet.  CT abdomen and pelvis also showed a gastric lesion in the antrum concerning for an antral mass and multiple liver lesions concerning for metastatic disease.  IR consulted and he underwent a liver biopsy on 05/03/2020.  Patient will probably need oncology referral once the results are out. PT/OT eval recommended SNF and TOC on board for SNF placement.  PT  Seen and examined. No new complaints. He is agreeable to go to SNF when bed becomes available.   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)   Cerebral embolism with cerebral infarction   Pulmonary emboli ([place])   Palliative care by specialist   Goals of care, counseling/discussion   DNR (do not resuscitate)   Status post tracheostomy ([place])   Gastric antral mass with possible metastatic lesions in the liver; GI recommended IR guided liver biopsy,underwent liver biopsy on 05/04/20. Oncology consulted for recommendations and outpatient follow up. He is scheduled for outpatient follow up.  Pathology results of the liver biopsy are pending.  Bowel regimen and pain control.    Massive pulmonary embolism S/p IR guided mechanical thrombectomy and IVC filter placement. Unable to anticoagulate secondary to hemorrhagic conversion of the stroke and acute GI bleeding.  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 He is alert and answering some simple questions, though he is confused.     Cardiac arrest probably secondary to massive PE.    Acute blood loss anemia probably secondary to upper GI bleed S/p upper EGD significant for obstructive oozing gastric ulcer. S/p IR guided GDA embolization. Transfuse to keep hemoglobin greater than 7 Continue with PPI. No obvious signs of bleeding.  Hemoglobin stable between 9-10.   Dysphagia SLP evaluation recommending dysphagia diet. No coughing or sob today.     Acute hypoxic arrest encephalopathy secondary to cardiac arrest. Resolved. Pt is on RA with good sats.     Delusions/hallucinations Psychiatric consulted and recommendations given. Continue with Seroquel, Prozac and Trileptal. None today. Continue with the sitter at bedside for safety.      DVT prophylaxis:  SCDs  code Status: DNR Family Communication: None at bedside Disposition:   Status is: Inpatient  Remains inpatient appropriate because:Unsafe d/c plan   Dispo: The patient is from: Home               Anticipated d/c is to: SNF              Anticipated d/c date is: 2 days              Patient currently is medically stable to d/c.        Consultants:   IR  Neurology  PCCM  Gastroenterology  Procedures: EGD, IR guided GDA embolization. IR guided liver biopsy  Antimicrobials: None  Subjective: No chest pain or sob, no nausea, or vomiting.   Objective: Vitals:   05/04/20 1506 05/04/20 1549 05/04/20 2116 05/05/20 0754  BP: 129/68 107/80 107/80 118/82  Pulse:  99 96 97  Resp:  19  20  Temp: (!) 96.7 F (35.9 C) 97.9 F (36.6 C) 97.7 F (36.5 C) 98.6 F (37 C)  TempSrc: Axillary  Oral   SpO2:  97% 100% 95%  Weight:      Height:       No intake or output data in the 24 hours ending 05/05/20 1336 Filed Weights   04/29/20 1557 04/30/20 0500 05/01/20 0648  Weight: 63 kg 63.9 kg 63 kg    Examination:  General exam: Alert and comfortable, not in any kind of distress. Respiratory system: Clear to auscultation bilaterally, no wheezing or rhonchi Cardiovascular system: S1-S2 heard, regular rate rhythm, no JVD, no pedal edema Gastrointestinal system: Abdomen is soft, nontender, nondistended, bowel sounds normal Central nervous system: Patient is alert and oriented to person only, able to answer simple questions. Extremities: No pedal edema Skin: No rashes seen Psychiatry: Mood is appropriate.  Data Reviewed: I have personally reviewed following labs and imaging studies  CBC: Recent Labs  Lab 05/02/20 1137 05/03/20 1130  WBC 6.9 7.2  HGB 9.7* 10.0*  HCT 30.0* 31.5*  MCV 92.0 93.2  PLT 398 408*    Basic Metabolic Panel: Recent Labs  Lab 05/02/20 1404 05/03/20 1130  NA 138 139  K 4.0 4.2  CL 105 107  CO2 26 21*  GLUCOSE 90 81  BUN 10 12  CREATININE 0.89 0.79  CALCIUM 8.6* 8.6*    GFR: Estimated Creatinine Clearance: 85.4 mL/min (by C-G formula based on SCr of 0.79 mg/dL).  Liver Function Tests: Recent Labs  Lab 05/02/20 1404  05/03/20 1130  AST 16 18  ALT 10 11  ALKPHOS 60 59  BILITOT 0.4 0.4  PROT 4.9* 5.3*  ALBUMIN 2.2* 2.3*    CBG: Recent Labs  Lab 05/04/20 1551 05/04/20 2107 05/05/20 0753 05/05/20 0835 05/05/20 1144  GLUCAP 106* 88 49* 128* 67*     No results found for this or any previous visit (from the past 240 hour(s)).       Radiology Studies: No results found.      Scheduled Meds: . chlorhexidine gluconate (MEDLINE KIT)  15 mL Mouth Rinse BID  . feeding supplement (ENSURE ENLIVE)  237 mL Oral QID  . FLUoxetine  10 mg Oral QHS  . OXcarbazepine  75 mg Oral BID  . pantoprazole  40 mg Oral BID  . polyethylene glycol  17 g Oral Daily  . QUEtiapine  50 mg Oral BID   Continuous Infusions: . sodium chloride       LOS: 59 days  Hosie Poisson, MD [facility]   To contact the attending provider between 7A-7P or the covering provider during after hours 7P-7A, please log into the web site [email or web address] and access using universal [place] password for that web site. If you do not have the password, please call the hospital operator.  05/05/2020, 1:36 PM

## 2020-05-05 NOTE — Progress Notes (Signed)
Physical Therapy Treatment Patient Details Name: Kenneth Sullivan MRN: [id number] DOB: [birth year] Today's Date: 05/05/2020    History of Present Illness Pt is 64 yo male with unknown medical hx.  He presented to ED on 3/23 with acute SHOB.  He had bradycardiac event that lead to asystole/cardiac arrest (ROSC achieved in 20 mins with 3 rounds epinephrine).  Pt was intubated and admitted to ICU.  Pt with massive PE/DVT and RV strain.  He underwent IR guided mechanical thrombectomy and IVC Filter.   Pt developed gastric ulcer and required GDA embolization.  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 Pt s/p trach on 4/5 and changed to cuffless on 4/22--decannualted 4/28    PT Comments    Pt agreeable to ambulation with PT but denied further treatment.  Pt requiring cues for safety and increasing BOS.  Pt had 3 LOB with gait - consider trying AD if pt agreeable.    Follow Up Recommendations  SNF     Equipment Recommendations  Rolling walker with 5" wheels    Recommendations for Other Services       Precautions / Restrictions Precautions Precautions: Fall    Mobility  Bed Mobility Overal bed mobility: Needs Assistance Bed Mobility: Supine to Sit     Supine to sit: Supervision        Transfers Overall transfer level: Needs assistance Equipment used: 1 person hand held assist Transfers: Sit to/from Stand Sit to Stand: Min guard         General transfer comment: min guard for safety  Ambulation/Gait Ambulation/Gait assistance: Min assist Gait Distance (Feet): 150 Feet Assistive device: 1 person hand held assist Gait Pattern/deviations: Step-through pattern;Decreased stride length;Narrow base of support;Drifts right/left;Trunk  flexed Gait velocity: decr   General Gait Details: cued for posture; assist to avoid objects (particularly on left); assist for 3 LOB   Stairs             Wheelchair Mobility    Modified Rankin (Stroke Patients Only)       Balance Overall balance assessment: Needs assistance Sitting-balance support: No upper extremity supported;Feet supported Sitting balance-Leahy Scale: Fair     Standing balance support: Single extremity supported Standing balance-Leahy Scale: Poor Standing balance comment: reliant on external support                            Cognition Arousal/Alertness: Awake/alert Behavior During Therapy: Restless;Agitated Overall Cognitive Status: Impaired/Different from baseline Area of Impairment: Orientation;Attention;Following commands;Memory;Safety/judgement;Problem solving                 Orientation Level: Disoriented to;Time;Situation     Following Commands: Follows one step commands inconsistently Safety/Judgement: Decreased awareness of deficits;Decreased awareness of safety   Problem Solving: Difficulty sequencing;Requires tactile cues;Requires verbal cues;Decreased initiation;Slow processing General Comments: Pt perservating on calling sister.      Exercises      General Comments General comments (skin integrity, edema, etc.): Safety sitter present; attempted to test vision but pt becoming upset and wanting to call sister      Pertinent Vitals/Pain Pain Assessment: No/denies pain    Home Living                      Prior Function  PT Goals (current goals can now be found in the care plan section) Acute Rehab PT Goals Patient Stated Goal: Pt unable PT Goal Formulation: Patient unable to participate in goal setting Time For Goal Achievement: 05/19/20 Potential to Achieve Goals: Fair Progress towards PT goals: Goals met and updated - see care plan    Frequency    Min 2X/week      PT  Plan Current plan remains appropriate    Co-evaluation              AM-PAC PT "6 Clicks" Mobility   Outcome Measure  Help needed turning from your back to your side while in a flat bed without using bedrails?: None Help needed moving from lying on your back to sitting on the side of a flat bed without using bedrails?: None Help needed moving to and from a bed to a chair (including a wheelchair)?: A Little Help needed standing up from a chair using your arms (e.g., wheelchair or bedside chair)?: A Little Help needed to walk in hospital room?: A Little Help needed climbing 3-5 steps with a railing? : A Lot 6 Click Score: 19    End of Session Equipment Utilized During Treatment: Gait belt Activity Tolerance: Patient tolerated treatment well Patient left: with call bell/phone within reach;with nursing/sitter in room;with chair alarm set;in chair Nurse Communication: Mobility status PT Visit Diagnosis: Unsteadiness on feet (R26.81);Other abnormalities of gait and mobility (R26.89)     Time: 9528-4132 PT Time Calculation (min) (ACUTE ONLY): 11 min  Charges:  $Gait Training: 8-22 mins                     Maggie Font, PT Acute Rehab Services Pager [phone] [facility] [phone] [facility] [phone]    [name] 05/05/2020, 10:52 AM

## 2020-05-05 NOTE — Progress Notes (Signed)
Brief oncology note:  Call placed to pathology today and pathology report will not be available until Monday.  We will plan to follow-up with the patient once we have the pathology results.  Please contact oncology over the weekend for questions.  We will follow up on Monday, 05/08/2020.   Mikey Bussing, DNP, AGPCNP-BC, AOCNP Mon/Tues/Thurs/Fri 7am-5pm; Off Wednesdays Cell: [phone]

## 2020-05-06 LAB — CBC WITH DIFFERENTIAL/PLATELET
Abs Immature Granulocytes: 0.03 10*3/uL (ref 0.00–0.07)
Basophils Absolute: 0 10*3/uL (ref 0.0–0.1)
Basophils Relative: 0 %
Eosinophils Absolute: 0.1 10*3/uL (ref 0.0–0.5)
Eosinophils Relative: 1 %
HCT: 31.2 % — ABNORMAL LOW (ref 39.0–52.0)
Hemoglobin: 9.9 g/dL — ABNORMAL LOW (ref 13.0–17.0)
Immature Granulocytes: 0 %
Lymphocytes Relative: 16 %
Lymphs Abs: 1.4 10*3/uL (ref 0.7–4.0)
MCH: 29.4 pg (ref 26.0–34.0)
MCHC: 31.7 g/dL (ref 30.0–36.0)
MCV: 92.6 fL (ref 80.0–100.0)
Monocytes Absolute: 0.8 10*3/uL (ref 0.1–1.0)
Monocytes Relative: 10 %
Neutro Abs: 6.1 10*3/uL (ref 1.7–7.7)
Neutrophils Relative %: 73 %
Platelets: 431 10*3/uL — ABNORMAL HIGH (ref 150–400)
RBC: 3.37 MIL/uL — ABNORMAL LOW (ref 4.22–5.81)
RDW: 17.3 % — ABNORMAL HIGH (ref 11.5–15.5)
WBC: 8.5 10*3/uL (ref 4.0–10.5)
nRBC: 0 % (ref 0.0–0.2)

## 2020-05-06 LAB — BASIC METABOLIC PANEL
Anion gap: 7 (ref 5–15)
BUN: 15 mg/dL (ref 8–23)
CO2: 26 mmol/L (ref 22–32)
Calcium: 8.4 mg/dL — ABNORMAL LOW (ref 8.9–10.3)
Chloride: 105 mmol/L (ref 98–111)
Creatinine, Ser: 0.8 mg/dL (ref 0.61–1.24)
GFR calc Af Amer: >60 mL/min (ref >60)
GFR calc non Af Amer: >60 mL/min (ref >60)
Glucose, Bld: 95 mg/dL (ref 70–99)
Potassium: 3.9 mmol/L (ref 3.5–5.1)
Sodium: 138 mmol/L (ref 135–145)

## 2020-05-06 LAB — GLUCOSE, CAPILLARY
Glucose-Capillary: 80 mg/dL (ref 70–99)
Glucose-Capillary: 86 mg/dL (ref 70–99)
Glucose-Capillary: 103 mg/dL — ABNORMAL HIGH (ref 70–99)

## 2020-05-06 NOTE — Progress Notes (Signed)
PROGRESS NOTE    Kenneth Sullivan  ZGY:[id number] DOB: 1956/08/06 DOA: 03/07/2020 PCP: Medicine, [name]    Chief Complaint  Patient presents with  . Cardiac Arrest    Brief Narrative:  64 year old gentleman with no known prior history presents to ED with shortness of breath with hypoxia and a witnessed bradycardic event leading to asystole and cardiac arrest.  ROSC obtained in 3:20 rounds of epinephrine was brought into ED intubated and admitted to ICU by critical care team .  CT angiogram of the chest shows massive PE/DVT with ventricular strain s/p IR guided mechanical thrombectomy and IVC filter postprocedure while on the heparin drip patient started having maroon-colored red stools and bloody drainage from oral gastric tube.  GI consulted patient underwent endoscopy which shows gastric and ulcer oozing and blood clots.  IR consulted and he underwent GDA embolization.  As patient remained encephalopathic he underwent MRI of the brain showing multifocal acute to subacute ischemic infarcts involving bilateral cerebral hemispheres consistent with global hypoperfusion related to cardiac arrest and also associated with scattered petechial hemorrhage in the parietal lobe.  On 4/5 patient underwent tracheostomy.  Fortunately he did not require PEG tube at this time as his appetite improved and he is on oral diet.  CT abdomen and pelvis also showed a gastric lesion in the antrum concerning for an antral mass and multiple liver lesions concerning for metastatic disease.  IR consulted and he underwent a liver biopsy on 05/03/2020.  Patient will probably need oncology referral once the results are out. PT/OT eval recommended SNF and TOC on board for SNF placement.  He is agreeable to go to SNF when bed becomes available. Pt seen and examined at bedside.   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 Oncology consulted for recommendations and outpatient follow up. He is scheduled for outpatient follow up.  Pathology results of the liver biopsy are pending.  Bowel regimen and pain control.   Massive pulmonary embolism S/p IR guided mechanical thrombectomy and IVC filter placement. Unable to anticoagulate secondary to hemorrhagic conversion of the stroke and acute GI bleeding.  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 He is alert and able to answer some simple questions about his diet and his knees.  Baseline he appears confused.   Cardiac arrest probably secondary to massive PE. Patient denies any chest pain or shortness of breath.   Acute blood loss anemia probably secondary to upper GI bleed S/p upper EGD significant for obstructive oozing gastric ulcer. S/p IR guided GDA embolization. Transfuse to keep hemoglobin greater than 7 Continue with PPI. No obvious signs of bleeding.  Hemoglobin stable between 9-10.   Dysphagia SLP evaluation recommending dysphagia diet. No coughing with eating.    Acute hypoxic arrest encephalopathy secondary to cardiac arrest. Resolved. Pt is on RA with good sats.     Delusions/hallucinations Psychiatric consulted and recommendations given. Continue with Seroquel, Prozac and Trileptal. No delusions or hallucinations today.  Continue  with the sitter at bedside for safety.      DVT prophylaxis: SCDs  code Status: DNR Family Communication: None at bedside Disposition:   Status is: Inpatient  Remains  inpatient appropriate because:Unsafe d/c plan   Dispo: The patient is from: Home              Anticipated d/c is to: SNF              Anticipated d/c date is: 2 days              Patient currently is medically stable to d/c.        Consultants:   IR  Neurology  PCCM  Gastroenterology  Procedures: EGD, IR guided GDA embolization. IR guided liver biopsy  Antimicrobials: None  Subjective: No new complaints.   Objective: Vitals:   05/05/20 0754 05/05/20 1609 05/05/20 2143 05/06/20 0830  BP: 118/82 104/76 100/70 113/84  Pulse: 97 96 86 96  Resp: 20 19 16 16   Temp: 98.6 F (37 C) 98.6 F (37 C) 98.5 F (36.9 C) 98.2 F (36.8 C)  TempSrc:   Oral   SpO2: 95% 100% 95% 94%  Weight:      Height:        Intake/Output Summary (Last 24 hours) at 05/06/2020 1451 Last data filed at 05/05/2020 2203 Gross per 24 hour  Intake -  Output 350 ml  Net -350 ml   Filed Weights   04/29/20 1557 04/30/20 0500 05/01/20 0648  Weight: 63 kg 63.9 kg 63 kg    Examination:  General exam: alert and comfortable,  Respiratory system: Clear to auscultation bilaterally no wheezing or rhonchi Cardiovascular system: S1 S2 heard, regular rate rhythm, no JVD, no pedal edema Gastrointestinal system: Abdomen is soft, nontender bowel sounds normal Central nervous system: Patient is alert oriented to person only, able to answer some simple questions and move all his extremities. Extremities: No pedal edema Skin: No rashes seen Psychiatry: Mood is appropriate  Data Reviewed: I have personally reviewed following labs and imaging studies  CBC: Recent Labs  Lab 05/02/20 1137 05/03/20 1130 05/06/20 1135  WBC 6.9 7.2 8.5  NEUTROABS  --   --  6.1  HGB 9.7* 10.0* 9.9*  HCT 30.0* 31.5* 31.2*  MCV 92.0 93.2 92.6  PLT 398 408* 431*    Basic Metabolic Panel: Recent Labs  Lab 05/02/20 1404 05/03/20 1130 05/06/20 1135  NA 138 139 138  K 4.0 4.2 3.9  CL 105 107 105  CO2 26 21* 26   GLUCOSE 90 81 95  BUN 10 12 15   CREATININE 0.89 0.79 0.80  CALCIUM 8.6* 8.6* 8.4*    GFR: Estimated Creatinine Clearance: 85.4 mL/min (by C-G formula based on SCr of 0.8 mg/dL).  Liver Function Tests: Recent Labs  Lab 05/02/20 1404 05/03/20 1130  AST 16 18  ALT 10 11  ALKPHOS 60 59  BILITOT 0.4 0.4  PROT 4.9* 5.3*  ALBUMIN 2.2* 2.3*    CBG: Recent Labs  Lab 05/05/20 1144 05/05/20 1329 05/05/20 1607 05/06/20 0829 05/06/20 1139  GLUCAP 67* 81 96 80 86     No results found for this or any previous visit (from the past 240 hour(s)).       Radiology Studies: No results found.      Scheduled Meds: . chlorhexidine gluconate (MEDLINE KIT)  15 mL Mouth Rinse BID  . feeding supplement (ENSURE ENLIVE)  237 mL Oral QID  . FLUoxetine  10 mg Oral  QHS  . OXcarbazepine  75 mg Oral BID  . pantoprazole  40 mg Oral BID  . polyethylene glycol  17 g Oral Daily  . QUEtiapine  50 mg Oral BID   Continuous Infusions: . sodium chloride       LOS: 60 days        Hosie Poisson, MD [facility]   To contact the attending provider between 7A-7P or the covering provider during after hours 7P-7A, please log into the web site [email or web address] and access using universal [place] password for that web site. If you do not have the password, please call the hospital operator.  05/06/2020, 2:51 PM

## 2020-05-06 NOTE — Plan of Care (Signed)
  Problem: Activity: Goal: Ability to tolerate increased activity will improve Outcome: Progressing   Problem: Role Relationship: Goal: Method of communication will improve Outcome: Progressing   Problem: Activity: Goal: Ability to tolerate increased activity will improve Outcome: Progressing   Problem: Role Relationship: Goal: Method of communication will improve Outcome: Progressing   Problem: Education: Goal: Knowledge of General Education information will improve Description: Including pain rating scale, medication(s)/side effects and non-pharmacologic comfort measures Outcome: Progressing   Problem: Health Behavior/Discharge Planning: Goal: Ability to manage health-related needs will improve Outcome: Progressing   Problem: Clinical Measurements: Goal: Will remain free from infection Outcome: Progressing Goal: Diagnostic test results will improve Outcome: Progressing Goal: Respiratory complications will improve Outcome: Progressing Goal: Cardiovascular complication will be avoided Outcome: Progressing   Problem: Nutrition: Goal: Adequate nutrition will be maintained Outcome: Progressing   Problem: Coping: Goal: Level of anxiety will decrease Outcome: Progressing   Problem: Elimination: Goal: Will not experience complications related to bowel motility Outcome: Progressing   Problem: Pain Managment: Goal: General experience of comfort will improve Outcome: Progressing   Problem: Safety: Goal: Ability to remain free from injury will improve Outcome: Progressing   Problem: Skin Integrity: Goal: Risk for impaired skin integrity will decrease Outcome: Progressing   Problem: Education: Goal: Knowledge of disease or condition will improve Outcome: Progressing Goal: Knowledge of secondary prevention will improve Outcome: Progressing Goal: Knowledge of patient specific risk factors addressed and post discharge goals established will improve Outcome:  Progressing Goal: Individualized Educational Video(s) Outcome: Progressing   Problem: Coping: Goal: Will verbalize positive feelings about self Outcome: Progressing Goal: Will identify appropriate support needs Outcome: Progressing   Problem: Health Behavior/Discharge Planning: Goal: Ability to manage health-related needs will improve Outcome: Progressing   Problem: Self-Care: Goal: Ability to participate in self-care as condition permits will improve Outcome: Progressing Goal: Verbalization of feelings and concerns over difficulty with self-care will improve Outcome: Progressing Goal: Ability to communicate needs accurately will improve Outcome: Progressing   Problem: Nutrition: Goal: Risk of aspiration will decrease Outcome: Progressing Goal: Dietary intake will improve Outcome: Progressing   Problem: Intracerebral Hemorrhage Tissue Perfusion: Goal: Complications of Intracerebral Hemorrhage will be minimized Outcome: Progressing   Problem: Ischemic Stroke/TIA Tissue Perfusion: Goal: Complications of ischemic stroke/TIA will be minimized Outcome: Progressing   Problem: Spontaneous Subarachnoid Hemorrhage Tissue Perfusion: Goal: Complications of Spontaneous Subarachnoid Hemorrhage will be minimized Outcome: Progressing

## 2020-05-07 NOTE — Progress Notes (Signed)
PROGRESS NOTE    Kenneth Sullivan  DVV:[id number] DOB: July 25, 1956 DOA: 03/07/2020 PCP: Medicine, [name]    Chief Complaint  Patient presents with  . Cardiac Arrest    Brief Narrative:  64 year old gentleman with no known prior history presents to ED with shortness of breath with hypoxia and a witnessed bradycardic event leading to asystole and cardiac arrest.  ROSC obtained in 3:20 rounds of epinephrine was brought into ED intubated and admitted to ICU by critical care team .  CT angiogram of the chest shows massive PE/DVT with ventricular strain s/p IR guided mechanical thrombectomy and IVC filter postprocedure while on the heparin drip patient started having maroon-colored red stools and bloody drainage from oral gastric tube.  GI consulted patient underwent endoscopy which shows gastric and ulcer oozing and blood clots.  IR consulted and he underwent GDA embolization.  As patient remained encephalopathic he underwent MRI of the brain showing multifocal acute to subacute ischemic infarcts involving bilateral cerebral hemispheres consistent with global hypoperfusion related to cardiac arrest and also associated with scattered petechial hemorrhage in the parietal lobe.  On 4/5 patient underwent tracheostomy.  Fortunately he did not require PEG tube at this time as his appetite improved and he is on oral diet.  CT abdomen and pelvis also showed a gastric lesion in the antrum concerning for an antral mass and multiple liver lesions concerning for metastatic disease.  IR consulted and he underwent a liver biopsy on 05/03/2020.  Patient will probably need oncology referral once the results are out. PT/OT eval recommended SNF and TOC on board for SNF placement.  He is agreeable to go to SNF when bed becomes available.  Patient seen and examined at bedside,  no new complaints at this time.  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 Oncology consulted for recommendations and outpatient follow up. He is scheduled for outpatient follow up.  Pathology results of the liver biopsy are pending.  Continue pain control and bowel regimen.   Massive pulmonary embolism S/p IR guided mechanical thrombectomy and IVC filter placement. Unable to anticoagulate secondary to hemorrhagic conversion of the stroke and acute GI bleeding. Patient is on room air with good oxygen saturations and denies any shortness of breath.   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 Patient is alert and able to answer some simple questions.   Cardiac arrest probably secondary to massive PE. He denies any chest pain or shortness of breath   Acute blood loss anemia probably secondary to upper GI bleed S/p upper EGD significant for obstructive oozing gastric ulcer. S/p IR guided GDA embolization. Transfuse to keep hemoglobin greater than 7 Continue with PPI. No obvious signs of bleeding.  Hemoglobin stable between 9-10.   Dysphagia SLP evaluation recommending dysphagia diet. We will need assistance with feeding at this time.    Acute hypoxic arrest encephalopathy secondary to cardiac arrest. Resolved. Pt is on RA with good sats.     Delusions/hallucinations  Psychiatric consulted and recommendations given. Continue with Seroquel, Prozac and Trileptal. No delusions or hallucinations today.  Continue with the sitter at bedside for safety.      DVT prophylaxis: SCDs    code Status: DNR Family Communication: None at bedside, called his niece and left a message. Disposition:   Status is: Inpatient  Remains inpatient appropriate because:Unsafe d/c plan   Dispo: The patient is from: Home              Anticipated d/c is to: SNF              Anticipated d/c date is: 2 days              Patient currently is medically stable to d/c.        Consultants:   IR  Neurology  PCCM  Gastroenterology  Procedures: EGD, IR guided GDA embolization. IR guided liver biopsy  Antimicrobials: None  Subjective: No new complaints at this time Objective: Vitals:   05/06/20 1622 05/06/20 1702 05/06/20 2252 05/07/20 0859  BP: 94/64 95/66 100/73 93/66  Pulse: (!) 102 (!) 105 85 87  Resp: 16 16 14 16   Temp: 98.8 F (37.1 C)  98.5 F (36.9 C) 98.2 F (36.8 C)  TempSrc:   Oral Oral  SpO2: 97%  95% 90%  Weight:      Height:        Intake/Output Summary (Last 24 hours) at 05/07/2020 1338 Last data filed at 05/07/2020 0531 Gross per 24 hour  Intake 237 ml  Output 400 ml  Net -163 ml   Filed Weights   04/29/20 1557 04/30/20 0500 05/01/20 0648  Weight: 63 kg 63.9 kg 63 kg    Examination:  General exam: Alert and comfortable, not in any kind of distress Respiratory system: Clear to auscultation bilaterally, no wheezing or rhonchi Cardiovascular system: S1 1 S2 heard, regular rate rhythm, no JVD, no pedal edema Gastrointestinal system: Abdomen is soft, nontender, nondistended bowel sounds normal Central nervous system: He is alert, answering some simple questions. Extremities: No pedal edema Skin: No rashes seen Psychiatry: Mood is appropriate Data Reviewed: I have personally reviewed following labs and imaging studies  CBC: Recent Labs  Lab 05/02/20 1137 05/03/20 1130 05/06/20 1135  WBC 6.9 7.2 8.5  NEUTROABS  --   --  6.1  HGB 9.7* 10.0* 9.9*  HCT 30.0* 31.5* 31.2*  MCV 92.0 93.2 92.6  PLT 398 408* 431*    Basic Metabolic  Panel: Recent Labs  Lab 05/02/20 1404 05/03/20 1130 05/06/20 1135  NA 138 139 138  K 4.0 4.2 3.9  CL 105 107 105  CO2 26 21* 26  GLUCOSE 90 81 95  BUN 10 12 15   CREATININE 0.89 0.79 0.80  CALCIUM 8.6* 8.6* 8.4*    GFR: Estimated Creatinine Clearance: 85.4 mL/min (by C-G formula based on SCr of 0.8 mg/dL).  Liver Function Tests: Recent Labs  Lab 05/02/20 1404 05/03/20 1130  AST 16 18  ALT 10 11  ALKPHOS 60 59  BILITOT 0.4 0.4  PROT 4.9* 5.3*  ALBUMIN 2.2* 2.3*    CBG: Recent Labs  Lab 05/05/20 1329 05/05/20 1607 05/06/20 0829 05/06/20 1139 05/06/20 1622  GLUCAP 81 96 80 86 103*     No results found for this or any previous visit (from the past 240 hour(s)).       Radiology Studies: No results found.      Scheduled Meds: . chlorhexidine gluconate (MEDLINE KIT)  15  mL Mouth Rinse BID  . feeding supplement (ENSURE ENLIVE)  237 mL Oral QID  . FLUoxetine  10 mg Oral QHS  . OXcarbazepine  75 mg Oral BID  . pantoprazole  40 mg Oral BID  . polyethylene glycol  17 g Oral Daily  . QUEtiapine  50 mg Oral BID   Continuous Infusions: . sodium chloride       LOS: 61 days        Hosie Poisson, MD [facility]   To contact the attending provider between 7A-7P or the covering provider during after hours 7P-7A, please log into the web site [email or web address] and access using universal [place] password for that web site. If you do not have the password, please call the hospital operator.  05/07/2020, 1:38 PM

## 2020-05-07 NOTE — Progress Notes (Signed)
Pt resting. No distress noted.

## 2020-05-07 NOTE — Plan of Care (Signed)
  Problem: Role Relationship: Goal: Method of communication will improve Outcome: Progressing   Problem: Education: Goal: Knowledge of General Education information will improve Description: Including pain rating scale, medication(s)/side effects and non-pharmacologic comfort measures Outcome: Progressing   Problem: Health Behavior/Discharge Planning: Goal: Ability to manage health-related needs will improve Outcome: Progressing   Problem: Clinical Measurements: Goal: Will remain free from infection Outcome: Progressing Goal: Diagnostic test results will improve Outcome: Progressing Goal: Respiratory complications will improve Outcome: Progressing Goal: Cardiovascular complication will be avoided Outcome: Progressing   Problem: Nutrition: Goal: Adequate nutrition will be maintained Outcome: Progressing   Problem: Coping: Goal: Level of anxiety will decrease Outcome: Progressing   Problem: Elimination: Goal: Will not experience complications related to bowel motility Outcome: Progressing   Problem: Pain Managment: Goal: General experience of comfort will improve Outcome: Progressing   Problem: Safety: Goal: Ability to remain free from injury will improve Outcome: Progressing   Problem: Skin Integrity: Goal: Risk for impaired skin integrity will decrease Outcome: Progressing   Problem: Education: Goal: Knowledge of disease or condition will improve Outcome: Progressing Goal: Knowledge of secondary prevention will improve Outcome: Progressing Goal: Knowledge of patient specific risk factors addressed and post discharge goals established will improve Outcome: Progressing Goal: Individualized Educational Video(s) Outcome: Progressing   Problem: Coping: Goal: Will verbalize positive feelings about self Outcome: Progressing Goal: Will identify appropriate support needs Outcome: Progressing   Problem: Health Behavior/Discharge Planning: Goal: Ability to manage  health-related needs will improve Outcome: Progressing   Problem: Self-Care: Goal: Ability to participate in self-care as condition permits will improve Outcome: Progressing Goal: Verbalization of feelings and concerns over difficulty with self-care will improve Outcome: Progressing Goal: Ability to communicate needs accurately will improve Outcome: Progressing   Problem: Nutrition: Goal: Risk of aspiration will decrease Outcome: Progressing Goal: Dietary intake will improve Outcome: Progressing   Problem: Intracerebral Hemorrhage Tissue Perfusion: Goal: Complications of Intracerebral Hemorrhage will be minimized Outcome: Progressing   Problem: Ischemic Stroke/TIA Tissue Perfusion: Goal: Complications of ischemic stroke/TIA will be minimized Outcome: Progressing   Problem: Spontaneous Subarachnoid Hemorrhage Tissue Perfusion: Goal: Complications of Spontaneous Subarachnoid Hemorrhage will be minimized Outcome: Progressing

## 2020-05-08 DIAGNOSIS — E43 Unspecified severe protein-calorie malnutrition: Secondary | ICD-10-CM | POA: Insufficient documentation

## 2020-05-08 LAB — GLUCOSE, CAPILLARY
Glucose-Capillary: 82 mg/dL (ref 70–99)
Glucose-Capillary: 106 mg/dL — ABNORMAL HIGH (ref 70–99)

## 2020-05-08 LAB — SURGICAL PATHOLOGY

## 2020-05-08 NOTE — Progress Notes (Signed)
PROGRESS NOTE    Kenneth Sullivan  ZOX:[id number] DOB: [birth year] DOA: 03/07/2020 PCP: Medicine, [name]    Chief Complaint  Patient presents with  . Cardiac Arrest    Brief Narrative:  64 year old gentleman with no known prior history presents to ED with shortness of breath with hypoxia and a witnessed bradycardic event leading to asystole and cardiac arrest.  ROSC obtained in 3:20 rounds of epinephrine was brought into ED intubated and admitted to ICU by critical care team .  CT angiogram of the chest shows massive PE/DVT with ventricular strain s/p IR guided mechanical thrombectomy and IVC filter postprocedure while on the heparin drip patient started having maroon-colored red stools and bloody drainage from oral gastric tube.  GI consulted patient underwent endoscopy which shows gastric and ulcer oozing and blood clots.  IR consulted and he underwent GDA embolization.  As patient remained encephalopathic he underwent MRI of the brain showing multifocal acute to subacute ischemic infarcts involving bilateral cerebral hemispheres consistent with global hypoperfusion related to cardiac arrest and also associated with scattered petechial hemorrhage in the parietal lobe.  On 4/5 patient underwent tracheostomy.  Fortunately he did not require PEG tube at this time as his appetite improved and he is on oral diet.  CT abdomen and pelvis also showed a gastric lesion in the antrum concerning for an antral mass and multiple liver lesions concerning for metastatic disease.  IR consulted and he underwent a liver biopsy on 05/03/2020.  Patient will probably need oncology referral once the results are out. PT/OT eval recommended SNF and TOC on board for SNF placement.  He is agreeable to go to SNF when bed becomes available.  Bedside safety sitter was discontinued on Saturday morning.  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 Oncology consulted for recommendations and outpatient follow up. 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 Further recommendations as per oncology . Pt is on the list to be discharged to [facility], Will probably need referral to [facility] oncology center when he gets discharged.  Continue pain control and bowel regimen.   Massive pulmonary embolism S/p IR guided mechanical thrombectomy and IVC filter placement. Unable to anticoagulate secondary to hemorrhagic conversion of the stroke and acute GI bleeding. Patient is on room air with good oxygen saturations and denies any shortness of breath.   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 Patient is alert and able to answer some simple questions.   Cardiac arrest probably secondary to massive PE. Patient denies any chest pain or shortness of breath.   Acute blood loss anemia probably secondary to upper GI bleed S/p upper EGD significant for obstructive oozing gastric ulcer. S/p IR guided GDA embolization. Transfuse to keep hemoglobin greater than 7 Continue with PPI. No obvious signs of bleeding.  Hemoglobin stable between 9-10.   Dysphagia SLP  evaluation recommending dysphagia diet. We will need assistance with feeding at this time.    Acute hypoxic arrest encephalopathy secondary to cardiac arrest. Resolved.  He is on room air with good  oxygen saturations.    Delusions/hallucinations Psychiatric consulted and recommendations given. Continue with Seroquel, Prozac and Trileptal. No delusions or hallucinations today.       DVT prophylaxis: SCDs  code Status: DNR Family Communication: None at bedside called his niece and left a message. Disposition:   Status is: Inpatient  Remains inpatient appropriate because:Unsafe d/c plan   Dispo: The patient is from: Home              Anticipated d/c is to: SNF              Anticipated d/c date is: 1 day              Patient currently is medically stable to d/c.        Consultants:   IR  Neurology  PCCM  Gastroenterology  Procedures: EGD, IR guided GDA embolization. IR guided liver biopsy  Antimicrobials: None  Subjective: No new complaints at this time. Objective: Vitals:   05/07/20 0859 05/07/20 1620 05/07/20 2118 05/08/20 0733  BP: 93/66 94/67 117/85 98/73  Pulse: 87 95 95 90  Resp: 16 16 16 19   Temp: 98.2 F (36.8 C) 97.8 F (36.6 C) 98.1 F (36.7 C) 98.7 F (37.1 C)  TempSrc: Oral  Oral   SpO2: 90% 94% 96% 94%  Weight:      Height:        Intake/Output Summary (Last 24 hours) at 05/08/2020 1430 Last data filed at 05/07/2020 2120 Gross per 24 hour  Intake 237 ml  Output --  Net 237 ml   Filed Weights   04/29/20 1557 04/30/20 0500 05/01/20 0648  Weight: 63 kg 63.9 kg 63 kg    Examination:  General exam: Alert and comfortable, no distress noted Respiratory system: Clear to auscultation bilaterally, no wheezing or rhonchi Cardiovascular system: S1-S2 heard, regular rate rhythm, no JVD, no pedal edema Gastrointestinal system: Abdomen is soft, nontender, nondistended, bowel sounds normal Central nervous system: Patient is alert, pleasantly confused no distress. Extremities: No pedal edema Skin: No rashes seen Psychiatry: Mood is appropriate Data Reviewed: I have personally reviewed following labs and imaging  studies  CBC: Recent Labs  Lab 05/02/20 1137 05/03/20 1130 05/06/20 1135  WBC 6.9 7.2 8.5  NEUTROABS  --   --  6.1  HGB 9.7* 10.0* 9.9*  HCT 30.0* 31.5* 31.2*  MCV 92.0 93.2 92.6  PLT 398 408* 431*    Basic Metabolic Panel: Recent Labs  Lab 05/02/20 1404 05/03/20 1130 05/06/20 1135  NA 138 139 138  K 4.0 4.2 3.9  CL 105 107 105  CO2 26 21* 26  GLUCOSE 90 81 95  BUN 10 12 15   CREATININE 0.89 0.79 0.80  CALCIUM 8.6* 8.6* 8.4*    GFR: Estimated Creatinine Clearance: 85.4 mL/min (by C-G formula based on SCr of 0.8 mg/dL).  Liver Function Tests: Recent Labs  Lab 05/02/20 1404 05/03/20 1130  AST 16 18  ALT 10 11  ALKPHOS 60 59  BILITOT 0.4 0.4  PROT 4.9* 5.3*  ALBUMIN 2.2* 2.3*    CBG: Recent Labs  Lab 05/05/20 1607 05/06/20 0829 05/06/20 1139 05/06/20 1622 05/08/20 0734  GLUCAP 96 80 86 103* 82     No results found for this or any previous visit (from the past 240 hour(s)).  Radiology Studies: No results found.      Scheduled Meds: . chlorhexidine gluconate (MEDLINE KIT)  15 mL Mouth Rinse BID  . feeding supplement (ENSURE ENLIVE)  237 mL Oral QID  . FLUoxetine  10 mg Oral QHS  . OXcarbazepine  75 mg Oral BID  . pantoprazole  40 mg Oral BID  . polyethylene glycol  17 g Oral Daily  . QUEtiapine  50 mg Oral BID   Continuous Infusions: . sodium chloride       LOS: 62 days        Hosie Poisson, MD [facility]   To contact the attending provider between 7A-7P or the covering provider during after hours 7P-7A, please log into the web site [email or web address] and access using universal [place] password for that web site. If you do not have the password, please call the hospital operator.  05/08/2020, 2:30 PM

## 2020-05-08 NOTE — Progress Notes (Signed)
Occupational Therapy Treatment Patient Details Name: Kenneth Sullivan MRN: [id number] DOB: [birth year] Today's Date: 05/08/2020    History of present illness Pt is 64 yo male with unknown medical hx.  He presented to ED on 3/23 with acute SHOB.  He had bradycardiac event that lead to asystole/cardiac arrest (ROSC achieved in 20 mins with 3 rounds epinephrine).  Pt was intubated and admitted to ICU.  Pt with massive PE/DVT and RV strain.  He underwent IR guided mechanical thrombectomy and IVC Filter.   Pt developed gastric ulcer and required GDA embolization.  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 Pt s/p trach on 4/5 and changed to cuffless on 4/22--decannualted 4/28   OT comments  Pt making minimal progress towards OT goals this session. Pt continues to present with balance impairments, cognitive deficits and visual impairments impacting pts ability to engage in BADLs. Session focus on functional mobility, and UB/LB ADLs at sink. Overall, pt requires supervision for bed mobility, MIN A for functional mobility with no AD d/t noncompliance with AD, and MOD- MAX A for UB/LB ADLs d/t balance impairments and visual deficits. Pt required MAX A for set- up of all ADLs d/t visual impairments. Pt impulsive with all mobility needing direct tactile and verbal cues for safety awareness. Pt appears to be limited severely by vision however, pt becomes agitated when therapists begin to assess. Continue to recommend SNF placement for DC, will continue to follow acutely per POC.    Follow Up Recommendations  SNF;Supervision/Assistance - 24 hour    Equipment Recommendations  Other (comment)    Recommendations for Other Services      Precautions / Restrictions  Precautions Precautions: Fall Precaution Comments: impulsive; can become quickly agitated Restrictions Weight Bearing Restrictions: No       Mobility Bed Mobility Overal bed mobility: Needs Assistance Bed Mobility: Supine to Sit;Sit to Supine     Supine to sit: Supervision;HOB elevated Sit to supine: Supervision;HOB elevated   General bed mobility comments: supervision overall with cues for safety  Transfers Overall transfer level: Needs assistance Equipment used: 1 person hand held assist Transfers: Sit to/from Stand Sit to Stand: Min guard         General transfer comment: min guard for safety    Balance Overall balance assessment: Needs assistance Sitting-balance support: No upper extremity supported;Feet supported Sitting balance-Leahy Scale: Poor Sitting balance - Comments: at least min guard when reaching to feet to don socks   Standing balance support: Single extremity supported Standing balance-Leahy Scale: Poor Standing balance comment: reliant on external support                           ADL either performed or assessed with clinical judgement   ADL Overall ADL's : Needs assistance/impaired     Grooming: Oral care;Moderate assistance;Standing Grooming Details (indicate cue type and reason): MOD A to set- up toothbrush; d/t visual deficits; MIN A for standing balance as pt stands with very wide bos and needs positional cues to be close enough to sink     Lower Body Bathing: Min guard;Sitting/lateral leans Lower Body Bathing Details (indicate cue type and reason): to wash feet after incontinent episode; sitting in chair at sink Upper Body Dressing : Maximal assistance;Total assistance;Sitting Upper Body Dressing Details (indicate cue  type and reason): EOB Lower Body Dressing: Maximal assistance Lower Body Dressing Details (indicate cue type and reason): initially able to don L sock but unable to locate foot on R; visual deficits? difficult to  assess d/t agitation Toilet Transfer: Minimal assistance;Ambulation Toilet Transfer Details (indicate cue type and reason): RUE HHA, declined AD. MAX multimodal cues for directional cues         Functional mobility during ADLs: Minimal assistance;Cueing for safety;Cueing for sequencing General ADL Comments: pt continues to present with impaired balance, cognitive deficits and visual impairments limiting pts ability to engage in ADLs     Vision   Vision Assessment?: Vision impaired- to be further tested in functional context Additional Comments: difficult to assess visual impairments secondary to increasing agitation. noted visual acuity deficits with locating ADL items at sink as well as depth perception as pt needing MAX A to positon self close enough to sink.   Perception     Praxis      Cognition Arousal/Alertness: Awake/alert Behavior During Therapy: Restless;Agitated;Impulsive Overall Cognitive Status: Impaired/Different from baseline Area of Impairment: Orientation;Attention;Following commands;Safety/judgement;Awareness;Problem solving                 Orientation Level: Disoriented to;Time;Situation Current Attention Level: Focused   Following Commands: Follows one step commands inconsistently Safety/Judgement: Decreased awareness of deficits;Decreased awareness of safety Awareness: Intellectual Problem Solving: Difficulty sequencing;Requires tactile cues;Requires verbal cues;Decreased initiation;Slow processing General Comments: pt impulsive with all mobility and requires direct verbal and tactile cues; pt impulsively doffed gown during session and refused using RW        Exercises     Shoulder Instructions       General Comments      Pertinent Vitals/ Pain       Pain Assessment: No/denies pain  Home Living                                          Prior Functioning/Environment              Frequency  Min 2X/week         Progress Toward Goals  OT Goals(current goals can now be found in the care plan section)  Progress towards OT goals: Progressing toward goals  Acute Rehab OT Goals Patient Stated Goal: Pt unable OT Goal Formulation: Patient unable to participate in goal setting Time For Goal Achievement: 05/18/20 Potential to Achieve Goals: [place] Discharge plan remains appropriate    Co-evaluation                 AM-PAC OT "6 Clicks" Daily Activity     Outcome Measure   Help from another person eating meals?: A Lot Help from another person taking care of personal grooming?: A Lot Help from another person toileting, which includes using toliet, bedpan, or urinal?: A Lot Help from another person bathing (including washing, rinsing, drying)?: A Lot Help from another person to put on and taking off regular upper body clothing?: A Lot Help from another person to put on and taking off regular lower body clothing?: A Lot 6 Click Score: 12    End of Session Equipment Utilized During Treatment: Gait belt  OT Visit Diagnosis: Unsteadiness on feet (R26.81);Other abnormalities of gait and mobility (R26.89);Muscle weakness (generalized) (M62.81);Low vision, both eyes (H54.2);Other symptoms and signs involving cognitive function   Activity Tolerance Treatment limited secondary to agitation;Other (comment)  Patient Left in bed;with call bell/phone within reach;with bed alarm set   Nurse Communication Mobility status;Other (comment)(requesting coffee)        Time: [id number] OT Time Calculation (min): 28 min  Charges: OT General Charges $OT Visit: 1 Visit OT Treatments $Self Care/Home Management : 23-37 mins  Lanier Clam., COTA/L Acute Rehabilitation Services [phone] [place] 05/08/2020, 8:41 AM

## 2020-05-08 NOTE — Progress Notes (Signed)
Nutrition Follow-up  DOCUMENTATION CODES:   Underweight, Severe malnutrition in context of chronic illness  INTERVENTION:   - Feeding assistance with meals and snacks  -Ensure Enlive poQID, each supplement provides 350 kcal and 20 grams of protein  - Magic Cup TID with meals, each supplement provides 290 kcal and 9 grams of protein  - Encourage adequate PO intake  NUTRITION DIAGNOSIS:   Severe Malnutrition related to chronic illness (gastric lesion, multiple liver lesions, concern for metastatic disease) as evidenced by severe fat depletion, severe muscle depletion.  New diagnosis after completion of NFPE  GOAL:   Patient will meet greater than or equal to 90% of their needs  Progressing  MONITOR:   PO intake, Supplement acceptance, Labs, Weight trends  REASON FOR ASSESSMENT:   Ventilator    ASSESSMENT:   64 yo male admitted S/P cardiac arrest, S/P normothermia protocol. Found to have massive PE, DVT, GI bleed from gastric ulcer. PMH includes current smoker, heavy alcohol use.  4/02 - trach  4/09 - Cortrak placed  4/11- pt pulled out trach, replaced by RT,TF held due to Cortrak leaking  4/13 - MBS, Dysphagia 1 with thin liquids started 4/14 - Cortrak removed 4/16 - Cortrak replaced 4/22 - Cortrak clogged, replaced by diagnostic radiology (tip in pre-pyloric region of stomach) 4/25 - diet advanced to Dysphagia 2 4/27 - tube feeds changed to nocturnal 4/28 - decannulated 4/30 - pulled Cortrak, TF stopped 5/03 - diet upgraded to Dysphagia 3 5/05 - diet upgraded to Regular 5/19 - s/p liver lesion biopsy by IR  CT abdomen/pelvis obtained and significant for gastric lesion in the antrum concerning for gastric antra mass in addition to multiple liver lesions concerning for metastatic disease. Oncology following.  Pt accepting most Ensure Enlive supplements per [facility] documentation.  RD spoke with pt at bedside. Pt reports good appetite and that he is eating  well. Noted ~75% completed breakfast meal tray. RD provided pt with strawberry Ensure Enlive. RN aware. RD encouraged continued adequate PO intake.  Current weight: 63 kg (05/01/20) Admit weight: 72.6 kg  Meal Completion: 25-100% x last 8 documented meals  Medications reviewed and include: Ensure Enlive QID, miralax, protonix  Labs reviewed. CBG's: 82-103 x 24 hours  NUTRITION - FOCUSED PHYSICAL EXAM:    Most Recent Value  Orbital Region  Severe depletion  Upper Arm Region  Severe depletion  Thoracic and Lumbar Region  Severe depletion  Buccal Region  Severe depletion  Temple Region  Severe depletion  Clavicle Bone Region  Severe depletion  Clavicle and Acromion Bone Region  Severe depletion  Scapular Bone Region  Severe depletion  Dorsal Hand  Severe depletion  Patellar Region  Severe depletion  Anterior Thigh Region  Severe depletion  Posterior Calf Region  Severe depletion  Edema (RD Assessment)  None  Hair  Reviewed  Eyes  Reviewed  Mouth  Reviewed  Skin  Reviewed  Nails  Reviewed       Diet Order:   Diet Order            Diet regular Room service appropriate? No; Fluid consistency: Thin  Diet effective now              EDUCATION NEEDS:   Not appropriate for education at this time  Skin:  Skin Assessment: Reviewed RN Assessment  Last BM:  05/07/20  Height:   Ht Readings from Last 1 Encounters:  04/29/20 6\' 2"  (1.88 m)    Weight:   Wt Readings  from Last 1 Encounters:  05/01/20 63 kg    Ideal Body Weight:  86.4 kg  BMI:  Body mass index is 17.85 kg/m.  Estimated Nutritional Needs:   Kcal:  2100-2300  Protein:  110-125 gm  Fluid:  >/= 2 L    Gaynell Face, MS, RD, LDN Inpatient Clinical Dietitian Pager: [phone] Weekend/After Hours: [phone]

## 2020-05-09 LAB — GLUCOSE, CAPILLARY
Glucose-Capillary: 91 mg/dL (ref 70–99)
Glucose-Capillary: 112 mg/dL — ABNORMAL HIGH (ref 70–99)
Glucose-Capillary: 90 mg/dL (ref 70–99)
Glucose-Capillary: 81 mg/dL (ref 70–99)

## 2020-05-09 NOTE — Progress Notes (Signed)
Patient awake, incont of urine, care provided. Ambulated in room. Ate a few bites of dinner. PM meds administered. Patient with decreased safety awareness, bed alarm on, attempts to get OBB, easily redirectable. Bed alarm on. Will monitor.

## 2020-05-09 NOTE — Progress Notes (Addendum)
HEMATOLOGY-ONCOLOGY PROGRESS NOTE  SUBJECTIVE: Reports intermittent abdominal discomfort, but has no other complaints today.  No family at the bedside.  PHYSICAL EXAMINATION:  Vitals:   05/08/20 2301 05/09/20 0730  BP: 101/73 103/83  Pulse: 93 (!) 110  Resp: 16 11  Temp: 98.3 F (36.8 C) 98.7 F (37.1 C)  SpO2: 96% 96%   Filed Weights   04/29/20 1557 04/30/20 0500 05/01/20 0648  Weight: 63 kg 63.9 kg 63 kg    Intake/Output from previous day: No intake/output data recorded.  GENERAL:alert, no distress and comfortable ABDOMEN:abdomen soft, non-tender and normal bowel sounds  NEURO: Alert, confused at times and answered inappropriately at times  LABORATORY DATA:  I have reviewed the data as listed CMP Latest Ref Rng & Units 05/06/2020 05/03/2020 05/02/2020  Glucose 70 - 99 mg/dL 95 81 90  BUN 8 - 23 mg/dL 15 12 10   Creatinine 0.61 - 1.24 mg/dL 0.80 0.79 0.89  Sodium 135 - 145 mmol/L 138 139 138  Potassium 3.5 - 5.1 mmol/L 3.9 4.2 4.0  Chloride 98 - 111 mmol/L 105 107 105  CO2 22 - 32 mmol/L 26 21(L) 26  Calcium 8.9 - 10.3 mg/dL 8.4(L) 8.6(L) 8.6(L)  Total Protein 6.5 - 8.1 g/dL - 5.3(L) 4.9(L)  Total Bilirubin 0.3 - 1.2 mg/dL - 0.4 0.4  Alkaline Phos 38 - 126 U/L - 59 60  AST 15 - 41 U/L - 18 16  ALT 0 - 44 U/L - 11 10    Lab Results  Component Value Date   WBC 8.5 05/06/2020   HGB 9.9 (L) 05/06/2020   HCT 31.2 (L) 05/06/2020   MCV 92.6 05/06/2020   PLT 431 (H) 05/06/2020   NEUTROABS 6.1 05/06/2020    CT ABDOMEN PELVIS W CONTRAST  Result Date: 05/01/2020 CLINICAL DATA:  Acute abdominal pain EXAM: CT ABDOMEN AND PELVIS WITH CONTRAST TECHNIQUE: Multidetector CT imaging of the abdomen and pelvis was performed using the standard protocol following bolus administration of intravenous contrast. CONTRAST:  132m OMNIPAQUE IOHEXOL 300 MG/ML  SOLN COMPARISON:  03/23/2020 FINDINGS: Lower chest: Trace dependent right pleural effusion. Lung bases otherwise clear. Normal heart  size. No pericardial effusion. No hiatal hernia. Hepatobiliary: New hypodense hepatic lesions bilaterally. Left hepatic dome lesion measures 5.4 cm. Peripheral right hepatic lesion measures 2.4 cm. Anterior right hepatic dome lesion measures 1 cm. These are all measured on image 12 series 3. Lesions are compatible with new hepatic metastases. Focal fat noted along the falciform ligament anteriorly as before. No intrahepatic biliary dilatation. Hepatic and portal veins are patent. Gallbladder collapsed. Pancreas: Unremarkable. No pancreatic ductal dilatation or surrounding inflammatory changes. Spleen: Overall small in size. No focal abnormality. No parenchymal lesion. Adrenals/Urinary Tract: Normal adrenal glands. No renal obstruction or hydronephrosis. Posterior left upper pole renal cyst measures 8 cm. No hydroureter or urinary tract calculus. Bladder under distended. Stomach/Bowel: Diffuse hypodense transmural wall thickening of the stomach antrum, image 24 series 3 and image 10 series 8 suspicious for a gastric antral mass, predominant along the posterior wall roughly measuring 7.1 x 3.3 cm, image 9 series 8. Proximal stomach is distended. Difficult to exclude some component of partial gastric outlet obstruction. There is air and contrast within the small intestine more distally. No small bowel obstruction pattern. No free air. No significant free fluid, ascites, hemorrhage, hematoma or abscess. Vascular/Lymphatic: Infrarenal and aortic bifurcation atherosclerosis without aneurysm or dissection. No occlusive process. Mesenteric and renal vasculature appear patent. IVC filter noted. No bulky adenopathy. Reproductive: No  significant finding by CT. Other: No inguinal or abdominal wall hernia. Musculoskeletal: Degenerative changes of the spine and lower lumbar facet arthropathy. 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 Consider GI  consultation and repeat endoscopy. Aortic Atherosclerosis (ICD10-I70.0). These results will be called to the ordering clinician or representative by the Radiologist Assistant, and communication documented in the PACS or [employer]. Electronically Signed   By: Jerilynn Mages.  Shick M.D.   On: 05/01/2020 16:02   US BIOPSY (LIVER)  Result Date: 05/03/2020 INDICATION: 64 year old male with multiple liver lesions and concern for metastatic disease EXAM: ULTRASOUND-GUIDED BIOPSY LEFT LIVER MASS MEDICATIONS: None. ANESTHESIA/SEDATION: Moderate (conscious) sedation was employed during this procedure. A total of Versed 2.0 mg and Fentanyl 100 mcg was administered intravenously. Moderate Sedation Time: 10 minutes. The patient's level of consciousness and vital signs were monitored continuously by radiology nursing throughout the procedure under my direct supervision. FLUOROSCOPY TIME:  None). COMPLICATIONS: None PROCEDURE: Informed written consent was obtained from the patient after a thorough discussion of the procedural risks, benefits and alternatives. All questions were addressed. Maximal Sterile Barrier Technique was utilized including caps, mask, sterile gowns, sterile gloves, sterile drape, hand hygiene and skin antiseptic. A timeout was performed prior to the initiation of the procedure Ultrasound survey of the bilateral liver lobe performed with images stored and sent to PACs. The subxiphoid region of the abdomen was prepped with chlorhexidine in a sterile fashion, and a sterile drape was applied covering the operative field. A sterile gown and sterile gloves were used for the procedure. Local anesthesia was provided with 1% Lidocaine. Once the patient is prepped and draped sterilely and the skin and subcutaneous tissues were generously infiltrated with 1% lidocaine. A 17 gauge introducer needle was advanced into the left liver lobe targeting heterogeneously echoic mass of the left liver. The stylet was removed, and  multiple 18 gauge core biopsy were retrieved. Samples were placed into formalin for transportation to the lab. Gel-Foam pledgets were then infused with a small amount of saline for assistance with hemostasis. The needle was removed, and a final ultrasound image was performed. The patient tolerated the procedure well and remained hemodynamically stable throughout. No complications were encountered and no significant blood loss was encounter. IMPRESSION: Status post ultrasound-guided biopsy of left liver mass. Signed, Dulcy Fanny. Dellia Nims, RPVI [facility] Radiology Electronically Signed   By: Corrie Mckusick D.O.   On: 05/03/2020 16:16   DG Abd 2 Views  Result Date: 04/28/2020 CLINICAL DATA:  64 year old male with abdominal pain. History of GI bleeding treated with mesenteric embolization. EXAM: ABDOMEN - 2 VIEW COMPARISON:  Noncontrast CT Abdomen and Pelvis 03/23/2020. Abdominal radiographs 04/11/2020 and earlier. FINDINGS: Three supine views of the abdomen and pelvis. Negative lung bases. Enteric feeding tube removed from last month. Stable IVC filter and epigastric embolization coils. Non obstructed bowel gas pattern. Decreased retained stool in the large bowel from last month. No acute osseous abnormality identified. IMPRESSION: No acute findings. Non obstructed bowel gas pattern with decreased retained stool in the colon from last month. Electronically Signed   By: Genevie Ann M.D.   On: 04/28/2020 12:46   DG Abd Portable 1V  Result Date: 04/11/2020 CLINICAL DATA:  Feeding tube appears dislodged. EXAM: PORTABLE ABDOMEN - 1 VIEW COMPARISON:  04/06/2020 FINDINGS: Feeding tube enters the stomach but is coiled in the fundus, no longer having the tip in the region of the  antrum. IVC filter and upper abdominal vascular coils remain in place. Moderate amount of stool. IMPRESSION: Feeding tube now looped in the fundus of the stomach. Electronically Signed   By: Nelson Chimes M.D.   On: 04/11/2020 18:16    ASSESSMENT AND PLAN: 1.    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  Tracheostomy placement on 03/20/2020, now healed 4.  Cardiac arrest on 03/07/2020 5.  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  History of acute blood loss anemia likely due to upper GI bleed 7.  Delusions/hallucinations  Mr. Ostlund appears stable.  Biopsy result discussed with the patient who has limited insight into the diagnosis.  Discussed treatment options with the patient which would be chemotherapy consisting of etoposide and carboplatin.  Due to his deconditioned state, unclear if he could tolerate chemotherapy.  He is awaiting SNF placement.  Discussed with hospitalist who indicates that he may be going to a SNF near [place], [place].  We can arrange for outpatient follow-up to further discuss treatment options once a definitive disposition plan is known.  If he goes to [place], we can refer him to the [place] cancer center.  Attempted to call his niece, [name], to discuss diagnosis.  She did not answer and there was no voicemail set up.  We will plan to reach out again tomorrow.  Recommendations: 1.  Await disposition plan for the patient.  If he stays in [place], we can set him up at the [facility] health cancer center for outpatient follow-up.  Otherwise, we can refer him closer to where he will be staying. 2.  We we will attempt to contact family again tomorrow. 3.  Check chromogranin A  level 4.  Clarify with the pathologist whether the tumor is a high-grade well-differentiated neuroendocrine carcinoma or a poorly differentiated carcinoma    LOS: 9 days   Mikey Bussing, DNP, AGPCNP-BC, AOCNP 05/09/20 Mr. Edmondson was interviewed and examined.  I discussed the pathology findings with him.  The liver biopsy returned consistent with a neuroendocrine carcinoma, nonsmall cell.  The the tumor most likely started in the stomach.  The standard treatment recommendation is for systemic therapy, potentially etoposide/carboplatin, temozolomide, or a tyrosine kinase inhibitor.  We could also consider somatostatin analog therapy depending on results of a dotatate scan.  I was unable to reach his niece today.  Mr. Huckeby does not appear to understand the diagnosis and I feel it may be difficult for him to comply with treatment.  We will arrange for follow-up at the Cancer center here if he remains in [place].

## 2020-05-09 NOTE — Progress Notes (Signed)
PROGRESS NOTE    Kenneth Sullivan  GXQ:[id number] DOB: 29-Nov-1956 DOA: 03/07/2020 PCP: Medicine, [name]    Chief Complaint  Patient presents with  . Cardiac Arrest    Brief Narrative:  64 year old gentleman with no known prior history presents to ED with shortness of breath with hypoxia and a witnessed bradycardic event leading to asystole and cardiac arrest.  ROSC obtained in 3:20 rounds of epinephrine was brought into ED intubated and admitted to ICU by critical care team .  CT angiogram of the chest shows massive PE/DVT with ventricular strain s/p IR guided mechanical thrombectomy and IVC filter postprocedure while on the heparin drip patient started having maroon-colored red stools and bloody drainage from oral gastric tube.  GI consulted patient underwent endoscopy which shows gastric and ulcer oozing and blood clots.  IR consulted and he underwent GDA embolization.  As patient remained encephalopathic he underwent MRI of the brain showing multifocal acute to subacute ischemic infarcts involving bilateral cerebral hemispheres consistent with global hypoperfusion related to cardiac arrest and also associated with scattered petechial hemorrhage in the parietal lobe.  On 4/5 patient underwent tracheostomy.  Fortunately he did not require PEG tube at this time as his appetite improved and he is on oral diet.  CT abdomen and pelvis also showed a gastric lesion in the antrum concerning for an antral mass and multiple liver lesions concerning for metastatic disease.  IR consulted and he underwent a liver biopsy on 05/03/2020.  Liver biopsy shows high-grade neuroendocrine tumor of the gastric antrum.  Oncology consulted recommends outpatient follow-up for further discussions regarding chemotherapy. PT/OT eval recommended SNF and TOC on board for SNF placement.  He is agreeable to go to SNF when bed becomes available.  Bedside safety sitter was discontinued on Saturday morning.  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 Currently waiting for SNF placement.  Assessment & Plan:   Principal Problem:   Cardiac arrest [facility]) Active Problems:   Encounter for central line placement   GI bleed   Acute respiratory failure ([place])   Anoxic encephalopathy ([place])   Cerebral embolism with cerebral infarction   Pulmonary emboli ([place])   Palliative care by specialist   Goals of care, counseling/discussion   DNR (do not resuscitate)   Status post tracheostomy ([place])   Protein-calorie malnutrition, severe   High-grade neuroendocrine tumor of the gastric antrum with liver mets GI recommended IR guided liver biopsy,underwent liver biopsy on 05/04/20. Oncology consulted, recommends outpatient follow-up for discussions regarding chemotherapy.  Pt is on the list to be discharged to [facility], Will probably need referral to [facility] oncology center when he gets discharged if he gets accepted. Continue pain control and bowel regimen.   Massive pulmonary embolism S/p IR guided mechanical thrombectomy and IVC filter placement. Unable to anticoagulate secondary to hemorrhagic conversion of the stroke and acute GI bleeding. Patient is on room air with good oxygen saturations and denies any shortness of breath.   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 Patient is alert and able to answer simple questions.   Cardiac arrest probably secondary to massive PE. Patient denies any chest pain or shortness of breath.   Acute blood loss anemia probably secondary to upper GI bleed S/p upper EGD significant for obstructive oozing gastric ulcer. S/p IR guided GDA embolization. Transfuse to keep hemoglobin greater than 7 Continue with PPI. No obvious signs of bleeding.  Hemoglobin stable between 9-10.  Dysphagia SLP evaluation recommending dysphagia diet. We will need assistance with feeding at this time.    Acute hypoxic arrest encephalopathy  secondary to cardiac arrest. Resolved.  Patient is currently on room air with good oxygen saturations.    Delusions/hallucinations Psychiatric consulted and recommendations given. Continue with Seroquel, Prozac and Trileptal. No delusions or hallucinations today.       DVT prophylaxis: SCDs  code Status: DNR Family Communication: None at bedside called his niece but unable to leave a message. Disposition:   Status is: Inpatient  Remains inpatient appropriate because:Unsafe d/c plan   Dispo: The patient is from: Home              Anticipated d/c is to: SNF              Anticipated d/c date is: 1 day              Patient currently is medically stable to d/c.        Consultants:   IR  Neurology  PCCM  Gastroenterology  Procedures: EGD, IR guided GDA embolization. IR guided liver biopsy  Antimicrobials: None  Subjective: Chest pain, shortness of breath, nausea or vomiting. Objective: Vitals:   05/08/20 0733 05/08/20 1527 05/08/20 2301 05/09/20 0730  BP: 98/73 95/72 101/73 103/83  Pulse: 90 98 93 (!) 110  Resp: 19 20 16 11   Temp: 98.7 F (37.1 C) 98.1 F (36.7 C) 98.3 F (36.8 C) 98.7 F (37.1 C)  TempSrc:   Oral   SpO2: 94% 96% 96% 96%  Weight:      Height:       No intake or output data in the 24 hours ending 05/09/20 1532 Filed Weights   04/29/20 1557 04/30/20 0500 05/01/20 0648  Weight: 63 kg 63.9 kg 63 kg    Examination:  General exam: Alert and comfortable, no distress noted Respiratory system: Clear to auscultation bilaterally, no wheezing or rhonchi Cardiovascular system: S1-S2 heard, regular rate rhythm, no JVD, no pedal edema Gastrointestinal system: Abdomen is soft mildly tender in the epigastric area nondistended, bowel sounds normal Central nervous system: Patient is alert, able to answer some simple questions, pleasantly confused,  Extremities: No pedal edema, cyanosis or clubbing Skin: No rashes seen Psychiatry: Mood is  appropriate  Data Reviewed: I have personally reviewed following labs and imaging studies  CBC: Recent Labs  Lab 05/03/20 1130 05/06/20 1135  WBC 7.2 8.5  NEUTROABS  --  6.1  HGB 10.0* 9.9*  HCT 31.5* 31.2*  MCV 93.2 92.6  PLT 408* 431*    Basic Metabolic Panel: Recent Labs  Lab 05/03/20 1130 05/06/20 1135  NA 139 138  K 4.2 3.9  CL 107 105  CO2 21* 26  GLUCOSE 81 95  BUN 12 15  CREATININE 0.79 0.80  CALCIUM 8.6* 8.4*    GFR: Estimated Creatinine Clearance: 85.4 mL/min (by C-G formula based on SCr of 0.8 mg/dL).  Liver Function Tests: Recent Labs  Lab 05/03/20 1130  AST 18  ALT 11  ALKPHOS 59  BILITOT 0.4  PROT 5.3*  ALBUMIN 2.3*    CBG: Recent Labs  Lab 05/06/20 1622 05/08/20 0734 05/08/20 1526 05/09/20 0728 05/09/20 1131  GLUCAP 103* 82 106* 91 112*     No results found for this or any previous visit (from the past 240 hour(s)).       Radiology Studies: No results found.      Scheduled Meds: . chlorhexidine gluconate (MEDLINE KIT)  15 mL  Mouth Rinse BID  . feeding supplement (ENSURE ENLIVE)  237 mL Oral QID  . FLUoxetine  10 mg Oral QHS  . OXcarbazepine  75 mg Oral BID  . pantoprazole  40 mg Oral BID  . polyethylene glycol  17 g Oral Daily  . QUEtiapine  50 mg Oral BID   Continuous Infusions: . sodium chloride       LOS: 63 days        Hosie Poisson, MD [facility]   To contact the attending provider between 7A-7P or the covering provider during after hours 7P-7A, please log into the web site [email or web address] and access using universal [place] password for that web site. If you do not have the password, please call the hospital operator.  05/09/2020, 3:32 PM

## 2020-05-09 NOTE — Progress Notes (Signed)
PT Cancellation Note  Patient Details Name: Kenneth Sullivan MRN: [id number] DOB: October 04, 1956   Cancelled Treatment:    Reason Eval/Treat Not Completed: Other (comment) Pt declined PT stating "I don't feel like it."  Attempted to encourage but pt denied PT.  Will f/u as able. Maggie Font, PT Acute Rehab Services Pager [phone] [facility] [phone]    [name] 05/09/2020, 11:27 AM

## 2020-05-10 LAB — CBC WITH DIFFERENTIAL/PLATELET
Abs Immature Granulocytes: 0.02 10*3/uL (ref 0.00–0.07)
Basophils Absolute: 0 10*3/uL (ref 0.0–0.1)
Basophils Relative: 0 %
Eosinophils Absolute: 0.2 10*3/uL (ref 0.0–0.5)
Eosinophils Relative: 2 %
HCT: 30.6 % — ABNORMAL LOW (ref 39.0–52.0)
Hemoglobin: 9.8 g/dL — ABNORMAL LOW (ref 13.0–17.0)
Immature Granulocytes: 0 %
Lymphocytes Relative: 21 %
Lymphs Abs: 1.4 10*3/uL (ref 0.7–4.0)
MCH: 29.2 pg (ref 26.0–34.0)
MCHC: 32 g/dL (ref 30.0–36.0)
MCV: 91.1 fL (ref 80.0–100.0)
Monocytes Absolute: 0.7 10*3/uL (ref 0.1–1.0)
Monocytes Relative: 10 %
Neutro Abs: 4.6 10*3/uL (ref 1.7–7.7)
Neutrophils Relative %: 67 %
Platelets: 470 10*3/uL — ABNORMAL HIGH (ref 150–400)
RBC: 3.36 MIL/uL — ABNORMAL LOW (ref 4.22–5.81)
RDW: 16.9 % — ABNORMAL HIGH (ref 11.5–15.5)
WBC: 6.9 10*3/uL (ref 4.0–10.5)
nRBC: 0 % (ref 0.0–0.2)

## 2020-05-10 LAB — BASIC METABOLIC PANEL
Anion gap: 9 (ref 5–15)
BUN: 15 mg/dL (ref 8–23)
CO2: 26 mmol/L (ref 22–32)
Calcium: 8.5 mg/dL — ABNORMAL LOW (ref 8.9–10.3)
Chloride: 103 mmol/L (ref 98–111)
Creatinine, Ser: 0.86 mg/dL (ref 0.61–1.24)
GFR calc Af Amer: >60 mL/min (ref >60)
GFR calc non Af Amer: >60 mL/min (ref >60)
Glucose, Bld: 89 mg/dL (ref 70–99)
Potassium: 3.9 mmol/L (ref 3.5–5.1)
Sodium: 138 mmol/L (ref 135–145)

## 2020-05-10 LAB — GLUCOSE, CAPILLARY
Glucose-Capillary: 89 mg/dL (ref 70–99)
Glucose-Capillary: 90 mg/dL (ref 70–99)
Glucose-Capillary: 113 mg/dL — ABNORMAL HIGH (ref 70–99)

## 2020-05-10 NOTE — Progress Notes (Signed)
Physical Therapy Treatment Patient Details Name: Kenneth Sullivan MRN: [id number] DOB: 11-Jul-1956 Today's Date: 05/10/2020    History of Present Illness Pt is 64 yo male with unknown medical hx.  He presented to ED on 3/23 with acute SHOB.  He had bradycardiac event that lead to asystole/cardiac arrest (ROSC achieved in 20 mins with 3 rounds epinephrine).  Pt was intubated and admitted to ICU.  Pt with massive PE/DVT and RV strain.  He underwent IR guided mechanical thrombectomy and IVC Filter.   Pt developed gastric ulcer and required GDA embolization.  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 Pt s/p trach on 4/5 and changed to cuffless on 4/22--decannualted 4/28    PT Comments    Pt required max verbal encouragement to participate in PT, initially agreeable to ambulation then not so bed-level exercises encouraged. Pt agreeable to exercises, stating "I want to go to the gym". Pt tolerated limited UE and LE exercises with use of light theraband and max multimodal cuing for form and correct use of bands. Theraband placed in pt closet for next session. Will progress mobility as tolerated.   Follow Up Recommendations  SNF     Equipment Recommendations  Rolling walker with 5" wheels    Recommendations for Other Services       Precautions / Restrictions Precautions Precautions: Fall Precaution Comments: impulsive; can become quickly agitated Restrictions Weight Bearing Restrictions: No    Mobility  Bed Mobility Overal bed mobility: Needs Assistance             General bed mobility comments: Mod assist for truncal support and lifting to scoot upwards in bed, pt with anterior trunk leaning and use of bedrails to assist. Further bed mobility  declined.  Transfers                 General transfer comment: unable to assess this day  Ambulation/Gait             General Gait Details: unable to assess this day - pt declines   Stairs             Wheelchair Mobility    Modified Rankin (Stroke Patients Only)       Balance                                            Cognition Arousal/Alertness: Awake/alert Behavior During Therapy: Restless;Impulsive Overall Cognitive Status: Impaired/Different from baseline Area of Impairment: Orientation;Attention;Following commands;Safety/judgement;Awareness;Problem solving                 Orientation Level: Disoriented to;Time;Situation;Place Current Attention Level: Focused Memory: Decreased short-term memory Following Commands: Follows one step commands inconsistently Safety/Judgement: Decreased awareness of deficits;Decreased awareness of safety Awareness: Intellectual Problem Solving: Difficulty sequencing;Requires tactile cues;Requires verbal cues;Decreased initiation;Slow processing General Comments: Pt repeats throughout session "I just have a lot going on, a lot of people are going to jail" then names celebrities who he says are going to jail who are not including Jay-z and Lorine Bears. Pt states he will mobilize with PT, then when PT offered to assist pt up he declines, refuses exercise then accepts because "I want to go to they gym, I like working out  with you".      Exercises General Exercises - Upper Extremity Elbow Extension: AROM;Theraband;Strengthening;Both;10 reps Theraband Level (Elbow Extension): Level 1 (Yellow) General Exercises - Lower Extremity Heel Slides: AROM;Both;10 reps;Supine Hip ABduction/ADduction: AROM;Strengthening;Both;10 reps;Supine(with theraband level 1)    General Comments        Pertinent Vitals/Pain Pain Assessment: No/denies pain Pain Intervention(s): Limited activity within patient's  tolerance;Monitored during session    Home Living                      Prior Function            PT Goals (current goals can now be found in the care plan section) Acute Rehab PT Goals Patient Stated Goal: Pt unable PT Goal Formulation: Patient unable to participate in goal setting Time For Goal Achievement: 05/19/20 Potential to Achieve Goals: Fair Progress towards PT goals: Progressing toward goals    Frequency    Min 2X/week      PT Plan Current plan remains appropriate    Co-evaluation              AM-PAC PT "6 Clicks" Mobility   Outcome Measure  Help needed turning from your back to your side while in a flat bed without using bedrails?: A Lot Help needed moving from lying on your back to sitting on the side of a flat bed without using bedrails?: A Lot Help needed moving to and from a bed to a chair (including a wheelchair)?: A Lot Help needed standing up from a chair using your arms (e.g., wheelchair or bedside chair)?: A Little Help needed to walk in hospital room?: A Little Help needed climbing 3-5 steps with a railing? : A Lot 6 Click Score: 14    End of Session Equipment Utilized During Treatment: Gait belt Activity Tolerance: Patient tolerated treatment well Patient left: with call bell/phone within reach;with nursing/sitter in room;with chair alarm set;in chair Nurse Communication: Mobility status PT Visit Diagnosis: Unsteadiness on feet (R26.81);Other abnormalities of gait and mobility (R26.89)     Time: [id number] PT Time Calculation (min) (ACUTE ONLY): 19 min  Charges:  $Therapeutic Exercise: 8-22 mins                    Vilma Will E, PT Acute Rehabilitation Services Pager [phone]  Office [phone]   Dianna Deshler D Wilburta Milbourn 05/10/2020, 11:19 AM

## 2020-05-10 NOTE — Progress Notes (Signed)
PROGRESS NOTE        PATIENT DETAILS Name: Kenneth Sullivan Age: 64 y.o. Sex: male Date of Birth: 08-14-1956 Admit Date: 03/07/2020 Admitting Physician Marshell Garfinkel, MD [id number], [name]  Brief Narrative: 64 year old with no significant past medical history.  Had acute shortness of breath with sats in the 40s on EMS arrival.  He had a witnessed bradycardic, asystolic arrest when EMS was present with 3 rounds of epi, 20 minutes to ROSC. Underwent normothermia protocol.  Work-up significant for massive PE, DVT with RV strain-unfortunately for the hospital course complicated by upper GI bleeding due to gastric ulcer.  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  Significant events: 3/23>> Admit, EGD with findings of oozing gastric ulcer with clot, IR for mechanical thrombectomy, IVC filter and GDA embolization 4/5>>Trach 4/28>>trach decannulated  Significant studies: CTA 3/23 >> bilateral PE with RV/with ratio 1.9, emphysema CT head 3/23 >> chronic microvascular changes.  No acute findings Echo 3/23 >> RV is severely dilated with reduced function and D-shaped septum, LVEF 60-65%. Venogram 3/23>> occlusive DVT of the iliac vein extending to the lower IVC LE Venous Duplex 3/24 >> acute DVT in BLE up to the femoral vein  MRI Brain 3/28 >> multifocal acute to subacute ischemic infarcts involving bilateral cerebral hemispheres, findings consistent with global hypoperfusion event related to cardiac arrest, associated scattered petechial hemorrhage about many areas of ischemia, evidence of hemorrhagic conversion in the right parietal lobe  Antimicrobial therapy: Doxycycline: 4/9>> 4/17  Microbiology data: 3/23: Blood culture>> negative so far 4/10: Tracheal aspirate culture>> normal respiratory flora 4/10: Blood culture>> no growth 4/27: Urine culture>>  Proteus mirabilis 3/23:Sars COV2 3 >> negative  Surgical pathology: 5/19 >>liver biopsy: Neuroendocrine tumor  Procedures : 3/23-3/24>> LTM EEG: No seizures-moderate to severe diffuse encephalopathy 3/23>> spot EEG: No seizures, severe to profound diffuse encephalopathy 3/23>> placement of retrievable IVC filter, pulmonary angiogram and mechanical/aspiration thrombectomy 3/23 >>EGD >> large gastric ulcer 3/23 >>embolization of right gastric artery/gastroduodenal artery 3/23 by IR>> 3/23 >>4/5 ETT 4/5>>Trach >>changed to 6 cuffless 4/22 >> decannulated 4/28 5/19>> ultrasound-guided liver biopsy  Consults: CCM, IR, neurology, cardiology, [facility]  DVT Prophylaxis : SCD's  Subjective: Pleasantly confused-follows some commands.  Assessment/Plan: Acute hypoxic respiratory failure in the setting of PE/cardiac arrest: Managed in the ICU-s/p tracheotomy-subsequently decannulated-currently stable on room air.  Cardiac arrest: Secondary to massive PE-continue telemetry monitoring.  Patient is not a long-term anticoagulation candidate given large gastric ulcer-secondary to neuroendocrine tumor.  Massive PE with DVT: Not anticoagulation candidate-given malignant gastric ulcer with high risk potential for bleeding.  Is s/p IVC filter placement and mechanical thrombectomy.  Anoxic encephalopathy: Pleasantly confused-able to answer simple questions.  Upper GI bleeding secondary to large malignant gastric ulcer with acute blood loss anemia: Underwent EGD-and subsequently embolization of GDA by IR.  No further bleeding apparent-on PPI.  Hemoglobin stable.  Dysphagia: Secondary to anoxia-SLP following.  Delusions/hallucinations: Psych consulted-currently relatively stable-ICU delirium likely a component-continue antipsychotics.  Nutrition Problem: Nutrition Problem: Severe Malnutrition Etiology: chronic illness(gastric lesion, multiple liver lesions, concern for metastatic  disease) Signs/Symptoms: severe fat depletion, severe muscle depletion Interventions: Ensure Enlive (each supplement provides 350kcal and 20 grams of protein), Magic cup   Diet: Diet Order  Diet regular Room service appropriate? No; Fluid consistency: Thin  Diet effective now               Code Status:  DNR  Family Communication: None at bedside-we will reach out to family over the next few days  Disposition Plan: Status is: Inpatient  Remains inpatient appropriate because:Inpatient level of care appropriate due to severity of illness   Dispo: The patient is from: Home              Anticipated d/c is to: SNF              Anticipated d/c date is: 1 day              Patient currently is medically stable to d/c.   Barriers to Discharge: Awaiting SNF bed.  Antimicrobial agents: Anti-infectives (From admission, onward)   Start     Dose/Rate Route Frequency Ordered Stop   03/26/20 1100  doxycycline (VIBRAMYCIN) 100 mg in sodium chloride 0.9 % 250 mL IVPB  Status:  Discontinued     100 mg 125 mL/hr over 120 Minutes Intravenous 2 times daily 03/26/20 1002 04/01/20 1035   03/25/20 1100  doxycycline (VIBRA-TABS) tablet 100 mg  Status:  Discontinued     100 mg Per Tube Every 12 hours 03/25/20 1009 03/26/20 1002   03/25/20 1015  doxycycline (VIBRAMYCIN) 100 mg in sodium chloride 0.9 % 250 mL IVPB  Status:  Discontinued    Note to Pharmacy: To be given after the tracheal aspirate is collected.   100 mg 125 mL/hr over 120 Minutes Intravenous Every 12 hours 03/25/20 1005 03/25/20 1008       Time spent: 25- minutes-Greater than 50% of this time was spent in counseling, explanation of diagnosis, planning of further management, and coordination of care.  MEDICATIONS: Scheduled Meds: . chlorhexidine gluconate (MEDLINE KIT)  15 mL Mouth Rinse BID  . feeding supplement (ENSURE ENLIVE)  237 mL Oral QID  . FLUoxetine  10 mg Oral QHS  . OXcarbazepine  75 mg Oral BID  .  pantoprazole  40 mg Oral BID  . polyethylene glycol  17 g Oral Daily  . QUEtiapine  50 mg Oral BID   Continuous Infusions: . sodium chloride     PRN Meds:.sodium chloride, acetaminophen, docusate sodium, guaiFENesin-dextromethorphan, hydrALAZINE, ipratropium-albuterol, LORazepam, LORazepam **OR** [DISCONTINUED] LORazepam, oxyCODONE, sodium chloride flush   PHYSICAL EXAM: Vital signs: Vitals:   05/09/20 0730 05/09/20 1617 05/09/20 2216 05/10/20 0722  BP: 103/83 95/76 106/73 101/76  Pulse: (!) 110 92 91 93  Resp: 11 11 20 16   Temp: 98.7 F (37.1 C) 99.7 F (37.6 C) 98.5 F (36.9 C) 98.9 F (37.2 C)  TempSrc:      SpO2: 96% 97% (!) 80% 98%  Weight:      Height:       Filed Weights   04/29/20 1557 04/30/20 0500 05/01/20 0648  Weight: 63 kg 63.9 kg 63 kg   Body mass index is 17.85 kg/m.   Gen Exam: Confused but not in any distress. HEENT:atraumatic, normocephalic Chest: B/L clear to auscultation anteriorly CVS:S1S2 regular Abdomen:soft non tender, non distended Extremities:no edema Neurology: Non focal Skin: no rash  I have personally reviewed following labs and imaging studies  LABORATORY DATA: CBC: Recent Labs  Lab 05/06/20 1135 05/10/20 0312  WBC 8.5 6.9  NEUTROABS 6.1 4.6  HGB 9.9* 9.8*  HCT 31.2* 30.6*  MCV 92.6 91.1  PLT 431* 470*    Basic Metabolic Panel:  Recent Labs  Lab 05/06/20 1135 05/10/20 0312  NA 138 138  K 3.9 3.9  CL 105 103  CO2 26 26  GLUCOSE 95 89  BUN 15 15  CREATININE 0.80 0.86  CALCIUM 8.4* 8.5*    GFR: Estimated Creatinine Clearance: 79.5 mL/min (by C-G formula based on SCr of 0.86 mg/dL).  Liver Function Tests: No results for input(s): AST, ALT, ALKPHOS, BILITOT, PROT, ALBUMIN in the last 168 hours. No results for input(s): LIPASE, AMYLASE in the last 168 hours. No results for input(s): AMMONIA in the last 168 hours.  Coagulation Profile: No results for input(s): INR, PROTIME in the last 168 hours.  Cardiac  Enzymes: No results for input(s): CKTOTAL, CKMB, CKMBINDEX, TROPONINI in the last 168 hours.  BNP (last 3 results) No results for input(s): PROBNP in the last 8760 hours.  Lipid Profile: No results for input(s): CHOL, HDL, LDLCALC, TRIG, CHOLHDL, LDLDIRECT in the last 72 hours.  Thyroid Function Tests: No results for input(s): TSH, T4TOTAL, FREET4, T3FREE, THYROIDAB in the last 72 hours.  Anemia Panel: No results for input(s): VITAMINB12, FOLATE, FERRITIN, TIBC, IRON, RETICCTPCT in the last 72 hours.  Urine analysis:    Component Value Date/Time   COLORURINE AMBER (A) 04/11/2020 0153   APPEARANCEUR CLOUDY (A) 04/11/2020 0153   LABSPEC 1.024 04/11/2020 0153   PHURINE 7.0 04/11/2020 0153   GLUCOSEU NEGATIVE 04/11/2020 0153   HGBUR SMALL (A) 04/11/2020 0153   BILIRUBINUR NEGATIVE 04/11/2020 0153   KETONESUR NEGATIVE 04/11/2020 0153   PROTEINUR 100 (A) 04/11/2020 0153   NITRITE NEGATIVE 04/11/2020 0153   LEUKOCYTESUR LARGE (A) 04/11/2020 0153    Sepsis Labs: Lactic Acid, Venous    Component Value Date/Time   LATICACIDVEN 1.0 03/09/2020 0959    MICROBIOLOGY: No results found for this or any previous visit (from the past 240 hour(s)).  RADIOLOGY STUDIES/RESULTS: No results found.   LOS: 59 days   Oren Binet, MD  [facility]    To contact the attending provider between 7A-7P or the covering provider during after hours 7P-7A, please log into the web site [email or web address] and access using universal [place] password for that web site. If you do not have the password, please call the hospital operator.  05/10/2020, 2:06 PM

## 2020-05-10 NOTE — Progress Notes (Signed)
Brief Oncology Note:  Attempted to call the patient's niece to discuss biopsy results. The phone rang multiple times and I was unable to leave a voicemail. Will continue to attempt to reach out to her to discuss.  Mikey Bussing, DNP, AGPCNP-BC, AOCNP Mon/Tues/Thurs/Fri 7am-5pm; Off Wednesdays Cell: [phone]

## 2020-05-10 NOTE — Progress Notes (Signed)
Nutrition Follow-up  DOCUMENTATION CODES:   Underweight, Severe malnutrition in context of chronic illness  INTERVENTION:   - Feeding assistance with meals and snacks  -Ensure Enlive poQID, each supplement provides 350 kcal and 20 grams of protein  -MagicCup TID with meals, each supplement provides 290 kcal and 9 grams of protein  - Encourage adequate PO intake  NUTRITION DIAGNOSIS:   Severe Malnutrition related to chronic illness (gastric lesion, multiple liver lesions, concern for metastatic disease) as evidenced by severe fat depletion, severe muscle depletion.  Ongoing  GOAL:   Patient will meet greater than or equal to 90% of their needs  Progressing  MONITOR:   PO intake, Supplement acceptance, Labs, Weight trends  REASON FOR ASSESSMENT:   Ventilator    ASSESSMENT:   64 yo male admitted S/P cardiac arrest, S/P normothermia protocol. Found to have massive PE, DVT, GI bleed from gastric ulcer. PMH includes current smoker, heavy alcohol use.  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 Per Oncology, unsure if pt will be able to tolerate chemotherapy due to deconditioned state. Pt also does not appear to understand the diagnosis and Oncology feels it may be difficult for pt to comply with treatment.  Pt continues to await SNF placement.  Pt accepting >90% of Ensure Enlive supplements per [facility]  documentation.  No new weights since 05/01/20.  Meal Completion: 15-100% x last 8 meals  Medications reviewed and include: Ensure Enlive QID, protonix, miralax  Labs reviewed: hemoglobin 9.8 CBG's: 81-90 x 24 hours  Diet Order:   Diet Order            Diet regular Room service appropriate? No; Fluid consistency: Thin  Diet effective now              EDUCATION NEEDS:   Not appropriate for education at this time  Skin:  Skin Assessment: Reviewed RN Assessment  Last BM:  05/10/20 medium type 5  Height:   Ht Readings from Last 1 Encounters:  04/29/20 6\' 2"  (1.88 m)    Weight:   Wt Readings from Last 1 Encounters:  05/01/20 63 kg    Ideal Body Weight:  86.4 kg  BMI:  Body mass index is 17.85 kg/m.  Estimated Nutritional Needs:   Kcal:  2100-2300  Protein:  110-125 gm  Fluid:  >/= 2 L    Gaynell Face, MS, RD, LDN Inpatient Clinical Dietitian Pager: [phone] Weekend/After Hours: [phone]

## 2020-05-11 LAB — CHROMOGRANIN A: Chromogranin A (ng/mL): 85.1 ng/mL (ref 0.0–101.8)

## 2020-05-11 LAB — GLUCOSE, CAPILLARY: Glucose-Capillary: 84 mg/dL (ref 70–99)

## 2020-05-11 MED ORDER — SODIUM CHLORIDE 0.9 % IV SOLN
INTRAVENOUS | Status: DC
  Administered 2020-05-12: 500 mL via INTRAVENOUS

## 2020-05-11 NOTE — Progress Notes (Addendum)
HEMATOLOGY-ONCOLOGY PROGRESS NOTE  SUBJECTIVE: Reports intermittent abdominal discomfort, but has no other complaints today.  No family at the bedside.  PHYSICAL EXAMINATION:  Vitals:   05/11/20 0100 05/11/20 0755  BP: 94/69 99/70  Pulse:  100  Resp:  19  Temp:  98.7 F (37.1 C)  SpO2:  96%   Filed Weights   04/29/20 1557 04/30/20 0500 05/01/20 0648  Weight: 63 kg 63.9 kg 63 kg    Intake/Output from previous day: No intake/output data recorded.  GENERAL:alert, no distress and comfortable ABDOMEN:abdomen soft, non-tender and normal bowel sounds  NEURO: Alert, confused at times and answered inappropriately at times  LABORATORY DATA:  I have reviewed the data as listed CMP Latest Ref Rng & Units 05/10/2020 05/06/2020 05/03/2020  Glucose 70 - 99 mg/dL 89 95 81  BUN 8 - 23 mg/dL 15 15 12   Creatinine 0.61 - 1.24 mg/dL 0.86 0.80 0.79  Sodium 135 - 145 mmol/L 138 138 139  Potassium 3.5 - 5.1 mmol/L 3.9 3.9 4.2  Chloride 98 - 111 mmol/L 103 105 107  CO2 22 - 32 mmol/L 26 26 21(L)  Calcium 8.9 - 10.3 mg/dL 8.5(L) 8.4(L) 8.6(L)  Total Protein 6.5 - 8.1 g/dL - - 5.3(L)  Total Bilirubin 0.3 - 1.2 mg/dL - - 0.4  Alkaline Phos 38 - 126 U/L - - 59  AST 15 - 41 U/L - - 18  ALT 0 - 44 U/L - - 11    Lab Results  Component Value Date   WBC 6.9 05/10/2020   HGB 9.8 (L) 05/10/2020   HCT 30.6 (L) 05/10/2020   MCV 91.1 05/10/2020   PLT 470 (H) 05/10/2020   NEUTROABS 4.6 05/10/2020    CT ABDOMEN PELVIS W CONTRAST  Result Date: 05/01/2020 CLINICAL DATA:  Acute abdominal pain EXAM: CT ABDOMEN AND PELVIS WITH CONTRAST TECHNIQUE: Multidetector CT imaging of the abdomen and pelvis was performed using the standard protocol following bolus administration of intravenous contrast. CONTRAST:  177m OMNIPAQUE IOHEXOL 300 MG/ML  SOLN COMPARISON:  03/23/2020 FINDINGS: Lower chest: Trace dependent right pleural effusion. Lung bases otherwise clear. Normal heart size. No pericardial effusion. No hiatal  hernia. Hepatobiliary: New hypodense hepatic lesions bilaterally. Left hepatic dome lesion measures 5.4 cm. Peripheral right hepatic lesion measures 2.4 cm. Anterior right hepatic dome lesion measures 1 cm. These are all measured on image 12 series 3. Lesions are compatible with new hepatic metastases. Focal fat noted along the falciform ligament anteriorly as before. No intrahepatic biliary dilatation. Hepatic and portal veins are patent. Gallbladder collapsed. Pancreas: Unremarkable. No pancreatic ductal dilatation or surrounding inflammatory changes. Spleen: Overall small in size. No focal abnormality. No parenchymal lesion. Adrenals/Urinary Tract: Normal adrenal glands. No renal obstruction or hydronephrosis. Posterior left upper pole renal cyst measures 8 cm. No hydroureter or urinary tract calculus. Bladder under distended. Stomach/Bowel: Diffuse hypodense transmural wall thickening of the stomach antrum, image 24 series 3 and image 10 series 8 suspicious for a gastric antral mass, predominant along the posterior wall roughly measuring 7.1 x 3.3 cm, image 9 series 8. Proximal stomach is distended. Difficult to exclude some component of partial gastric outlet obstruction. There is air and contrast within the small intestine more distally. No small bowel obstruction pattern. No free air. No significant free fluid, ascites, hemorrhage, hematoma or abscess. Vascular/Lymphatic: Infrarenal and aortic bifurcation atherosclerosis without aneurysm or dissection. No occlusive process. Mesenteric and renal vasculature appear patent. IVC filter noted. No bulky adenopathy. Reproductive: No significant finding by CT.  Other: No inguinal or abdominal wall hernia. Musculoskeletal: Degenerative changes of the spine and lower lumbar facet arthropathy. 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 Consider GI consultation and repeat endoscopy. Aortic  Atherosclerosis (ICD10-I70.0). These results will be called to the ordering clinician or representative by the Radiologist Assistant, and communication documented in the PACS or [employer]. Electronically Signed   By: Jerilynn Mages.  Shick M.D.   On: 05/01/2020 16:02   US BIOPSY (LIVER)  Result Date: 05/03/2020 INDICATION: 64 year old male with multiple liver lesions and concern for metastatic disease EXAM: ULTRASOUND-GUIDED BIOPSY LEFT LIVER MASS MEDICATIONS: None. ANESTHESIA/SEDATION: Moderate (conscious) sedation was employed during this procedure. A total of Versed 2.0 mg and Fentanyl 100 mcg was administered intravenously. Moderate Sedation Time: 10 minutes. The patient's level of consciousness and vital signs were monitored continuously by radiology nursing throughout the procedure under my direct supervision. FLUOROSCOPY TIME:  None). COMPLICATIONS: None PROCEDURE: Informed written consent was obtained from the patient after a thorough discussion of the procedural risks, benefits and alternatives. All questions were addressed. Maximal Sterile Barrier Technique was utilized including caps, mask, sterile gowns, sterile gloves, sterile drape, hand hygiene and skin antiseptic. A timeout was performed prior to the initiation of the procedure Ultrasound survey of the bilateral liver lobe performed with images stored and sent to PACs. The subxiphoid region of the abdomen was prepped with chlorhexidine in a sterile fashion, and a sterile drape was applied covering the operative field. A sterile gown and sterile gloves were used for the procedure. Local anesthesia was provided with 1% Lidocaine. Once the patient is prepped and draped sterilely and the skin and subcutaneous tissues were generously infiltrated with 1% lidocaine. A 17 gauge introducer needle was advanced into the left liver lobe targeting heterogeneously echoic mass of the left liver. The stylet was removed, and multiple 18 gauge core biopsy were  retrieved. Samples were placed into formalin for transportation to the lab. Gel-Foam pledgets were then infused with a small amount of saline for assistance with hemostasis. The needle was removed, and a final ultrasound image was performed. The patient tolerated the procedure well and remained hemodynamically stable throughout. No complications were encountered and no significant blood loss was encounter. IMPRESSION: Status post ultrasound-guided biopsy of left liver mass. Signed, Dulcy Fanny. Dellia Nims, RPVI [facility] Electronically Signed   By: Corrie Mckusick D.O.   On: 05/03/2020 16:16   DG Abd 2 Views  Result Date: 04/28/2020 CLINICAL DATA:  64 year old male with abdominal pain. History of GI bleeding treated with mesenteric embolization. EXAM: ABDOMEN - 2 VIEW COMPARISON:  Noncontrast CT Abdomen and Pelvis 03/23/2020. Abdominal radiographs 04/11/2020 and earlier. FINDINGS: Three supine views of the abdomen and pelvis. Negative lung bases. Enteric feeding tube removed from last month. Stable IVC filter and epigastric embolization coils. Non obstructed bowel gas pattern. Decreased retained stool in the large bowel from last month. No acute osseous abnormality identified. IMPRESSION: No acute findings. Non obstructed bowel gas pattern with decreased retained stool in the colon from last month. Electronically Signed   By: Genevie Ann M.D.   On: 04/28/2020 12:46   DG Abd Portable 1V  Result Date: 04/11/2020 CLINICAL DATA:  Feeding tube appears dislodged. EXAM: PORTABLE ABDOMEN - 1 VIEW COMPARISON:  04/06/2020 FINDINGS: Feeding tube enters the stomach but is coiled in the fundus, no longer having the tip in the region of the antrum. IVC filter and  upper abdominal vascular coils remain in place. Moderate amount of stool. IMPRESSION: Feeding tube now looped in the fundus of the stomach. Electronically Signed   By: Nelson Chimes M.D.   On: 04/11/2020 18:16     ASSESSMENT AND PLAN: 1.    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  Tracheostomy placement on 03/20/2020, now healed 4.  Cardiac arrest on 03/07/2020 5.  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  History of acute blood loss anemia likely due to upper GI bleed 7.  Delusions/hallucinations  Mr. Alverio appears stable.  Biopsy discussed with pathology who indicates that this is a well differentiated neuroendocrine tumor with high proliferation.  The diagnosis has been discussed with the patient who lacks insight into this diagnosis and treatment options.  Treatment for this condition would be a TKI.  Unclear if he could tolerate this medication given his deconditioned state.  Chromogranin A obtained yesterday and results are currently pending.  I have attempted to call the patient's niece, [name], to discuss the diagnosis and treatment options and have been unsuccessful.  Recommendations: 1.  Await disposition plan for the patient.  If he stays in [place], we can set him up at the [facility] for outpatient follow-up.  Otherwise, we can refer him closer to where he will be staying. 2.  We we will attempt to contact family again tomorrow. 3.  Check chromogranin A level   LOS: 65 days   Mikey Bussing, DNP, AGPCNP-BC, AOCNP 05/11/20  I discussed the case with pathology today.  The tumor appears well differentiated.  The tumor is of higher grade than we typically see with a well differentiated  neuroendocrine tumor, but I do not predict a high response rate from standard chemotherapy such as etoposide/carboplatin.  I will recommend treatment with a tyrosine kinase inhibitor or another oral systemic therapy agent.  However Mr. Guarisco appears to have very poor understanding of the diagnosis and his current status.  I have attempted to contact his niece on several occasions without success.  I tried both of her numbers again today.  We will arrange outpatient follow-up at the [facility] if he remains in [place].  Julieanne Manson, MD

## 2020-05-11 NOTE — Progress Notes (Addendum)
PROGRESS NOTE        PATIENT DETAILS Name: Kenneth Sullivan Age: 64 y.o. Sex: male Date of Birth: Dec 31, 1955 Admit Date: 03/07/2020 Admitting Physician Marshell Garfinkel, MD [id number], [name]  Brief Narrative: 64 year old with no significant past medical history.  Had acute shortness of breath with sats in the 40s on EMS arrival.  He had a witnessed bradycardic, asystolic arrest when EMS was present with 3 rounds of epi, 20 minutes to ROSC. Underwent normothermia protocol.  Work-up significant for massive PE, DVT with RV strain-unfortunately for the hospital course complicated by upper GI bleeding due to gastric ulcer.  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  Significant events: 3/23>> Admit, EGD with findings of oozing gastric ulcer with clot, IR for mechanical thrombectomy, IVC filter and GDA embolization 4/5>>Trach 4/28>>trach decannulated  Significant studies: CTA 3/23 >> bilateral PE with RV/with ratio 1.9, emphysema CT head 3/23 >> chronic microvascular changes.  No acute findings Echo 3/23 >> RV is severely dilated with reduced function and D-shaped septum, LVEF 60-65%. Venogram 3/23>> occlusive DVT of the iliac vein extending to the lower IVC LE Venous Duplex 3/24 >> acute DVT in BLE up to the femoral vein  MRI Brain 3/28 >> multifocal acute to subacute ischemic infarcts involving bilateral cerebral hemispheres, findings consistent with global hypoperfusion event related to cardiac arrest, associated scattered petechial hemorrhage about many areas of ischemia, evidence of hemorrhagic conversion in the right parietal lobe  Antimicrobial therapy: Doxycycline: 4/9>> 4/17  Microbiology data: 3/23: Blood culture>> negative so far 4/10: Tracheal aspirate culture>> normal respiratory flora 4/10: Blood culture>> no growth 4/27: Urine culture>>  Proteus mirabilis 3/23:Sars COV2 3 >> negative  Surgical pathology: 5/19 >>liver biopsy: Neuroendocrine tumor  Procedures : 3/23-3/24>> LTM EEG: No seizures-moderate to severe diffuse encephalopathy 3/23>> spot EEG: No seizures, severe to profound diffuse encephalopathy 3/23>> placement of retrievable IVC filter, pulmonary angiogram and mechanical/aspiration thrombectomy 3/23 >>EGD >> large gastric ulcer 3/23 >>embolization of right gastric artery/gastroduodenal artery 3/23 by IR>> 3/23 >>4/5 ETT 4/5>>Trach >>changed to 6 cuffless 4/22 >> decannulated 4/28 5/19>> ultrasound-guided liver biopsy  Consults: CCM, IR, neurology, cardiology, oncology, [facility]  DVT Prophylaxis : SCD's  Subjective: Answers simple questions-but is pleasantly confused.  Assessment/Plan: Acute hypoxic respiratory failure in the setting of PE/cardiac arrest: Managed in the ICU-s/p tracheotomy-subsequently decannulated-currently stable on room air.  Cardiac arrest: Secondary to massive PE-continue telemetry monitoring.  Patient is not a long-term anticoagulation candidate given large gastric ulcer-secondary to neuroendocrine tumor-given risk of massive GI bleeding.  Massive PE with DVT- s/p IVC filter: Not anticoagulation candidate-given malignant gastric ulcer with high risk potential for bleeding.  Is s/p IVC filter placement and mechanical thrombectomy.  Anoxic encephalopathy: Pleasantly confused-able to answer simple questions.  Upper GI bleeding secondary to large malignant gastric ulcer with acute blood loss anemia: Underwent EGD-and subsequently embolization of GDA by IR.  No further bleeding apparent-on PPI.  Hemoglobin stable.  Neuroendocrine tumor with liver metastases: Difficult situation-with anoxic encephalopathy and unable to process/understand malignant issue-oncology following-we will await further recommendations.  Dysphagia: Secondary to anoxia-SLP  following.  Delusions/hallucinations: Psych consulted-currently relatively stable-ICU delirium likely a component-continue antipsychotics.  Nutrition Problem: Nutrition Problem: Severe Malnutrition Etiology: chronic illness(gastric lesion, multiple liver lesions, concern for metastatic disease) Signs/Symptoms: severe  fat depletion, severe muscle depletion Interventions: Ensure Enlive (each supplement provides 350kcal and 20 grams of protein), Magic cup   Diet: Diet Order            Diet regular Room service appropriate? No; Fluid consistency: Thin  Diet effective now               Code Status:  DNR  Family Communication: Called [phone] answer  Disposition Plan: Status is: Inpatient  Remains inpatient appropriate because:Inpatient level of care appropriate due to severity of illness  Dispo: The patient is from: Home              Anticipated d/c is to: SNF              Anticipated d/c date is: 1 day              Patient currently is medically stable to d/c.   Barriers to Discharge: Awaiting SNF bed.  Antimicrobial agents: Anti-infectives (From admission, onward)   Start     Dose/Rate Route Frequency Ordered Stop   03/26/20 1100  doxycycline (VIBRAMYCIN) 100 mg in sodium chloride 0.9 % 250 mL IVPB  Status:  Discontinued     100 mg 125 mL/hr over 120 Minutes Intravenous 2 times daily 03/26/20 1002 04/01/20 1035   03/25/20 1100  doxycycline (VIBRA-TABS) tablet 100 mg  Status:  Discontinued     100 mg Per Tube Every 12 hours 03/25/20 1009 03/26/20 1002   03/25/20 1015  doxycycline (VIBRAMYCIN) 100 mg in sodium chloride 0.9 % 250 mL IVPB  Status:  Discontinued    Note to Pharmacy: To be given after the tracheal aspirate is collected.   100 mg 125 mL/hr over 120 Minutes Intravenous Every 12 hours 03/25/20 1005 03/25/20 1008       Time spent: 15- minutes-Greater than 50% of this time was spent in counseling, explanation of diagnosis, planning of further  management, and coordination of care.  MEDICATIONS: Scheduled Meds: . chlorhexidine gluconate (MEDLINE KIT)  15 mL Mouth Rinse BID  . feeding supplement (ENSURE ENLIVE)  237 mL Oral QID  . FLUoxetine  10 mg Oral QHS  . OXcarbazepine  75 mg Oral BID  . pantoprazole  40 mg Oral BID  . polyethylene glycol  17 g Oral Daily  . QUEtiapine  50 mg Oral BID   Continuous Infusions: . sodium chloride     PRN Meds:.sodium chloride, acetaminophen, docusate sodium, guaiFENesin-dextromethorphan, hydrALAZINE, ipratropium-albuterol, LORazepam, LORazepam **OR** [DISCONTINUED] LORazepam, oxyCODONE, sodium chloride flush   PHYSICAL EXAM: Vital signs: Vitals:   05/10/20 0722 05/10/20 1617 05/11/20 0100 05/11/20 0755  BP: 1_0 99/70  Pulse: 93 (!) 106  100  Resp: _1 Temp: 98.9 F (37.2 C) 98.9 F (37.2 C)  98.7 F (37.1 C)  TempSrc:      SpO2: 98% 95%  96%  Weight:      Height:       Filed Weights   04/29/20 1557 04/30/20 0500 05/01/20 0648  Weight: 63 kg 63.9 kg 63 kg   Body mass index is 17.85 kg/m.   Gen Exam: Confused but not in any distress. HEENT:atraumatic, normocephalic Chest: B/L clear to auscultation anteriorly CVS:S1S2 regular Abdomen:soft non tender, non distended Extremities:no edema Neurology: Non focal Skin: no rash  I have personally reviewed following labs and imaging studies  LABORATORY DATA: CBC: Recent Labs  Lab 05/06/20 1135 05/10/20 0312  WBC 8.5 6.9  NEUTROABS 6.1 4.6  HGB  9.9* 9.8*  HCT 31.2* 30.6*  MCV 92.6 91.1  PLT 431* 470*    Basic Metabolic Panel: Recent Labs  Lab 05/06/20 1135 05/10/20 0312  NA 138 138  K 3.9 3.9  CL 105 103  CO2 26 26  GLUCOSE 95 89  BUN 15 15  CREATININE 0.80 0.86  CALCIUM 8.4* 8.5*    GFR: Estimated Creatinine Clearance: 79.5 mL/min (by C-G formula based on SCr of 0.86 mg/dL).  Liver Function Tests: No results for input(s): AST, ALT, ALKPHOS, BILITOT, PROT, ALBUMIN in the last 168  hours. No results for input(s): LIPASE, AMYLASE in the last 168 hours. No results for input(s): AMMONIA in the last 168 hours.  Coagulation Profile: No results for input(s): INR, PROTIME in the last 168 hours.  Cardiac Enzymes: No results for input(s): CKTOTAL, CKMB, CKMBINDEX, TROPONINI in the last 168 hours.  BNP (last 3 results) No results for input(s): PROBNP in the last 8760 hours.  Lipid Profile: No results for input(s): CHOL, HDL, LDLCALC, TRIG, CHOLHDL, LDLDIRECT in the last 72 hours.  Thyroid Function Tests: No results for input(s): TSH, T4TOTAL, FREET4, T3FREE, THYROIDAB in the last 72 hours.  Anemia Panel: No results for input(s): VITAMINB12, FOLATE, FERRITIN, TIBC, IRON, RETICCTPCT in the last 72 hours.  Urine analysis:    Component Value Date/Time   COLORURINE AMBER (A) 04/11/2020 0153   APPEARANCEUR CLOUDY (A) 04/11/2020 0153   LABSPEC 1.024 04/11/2020 0153   PHURINE 7.0 04/11/2020 0153   GLUCOSEU NEGATIVE 04/11/2020 0153   HGBUR SMALL (A) 04/11/2020 0153   BILIRUBINUR NEGATIVE 04/11/2020 0153   KETONESUR NEGATIVE 04/11/2020 0153   PROTEINUR 100 (A) 04/11/2020 0153   NITRITE NEGATIVE 04/11/2020 0153   LEUKOCYTESUR LARGE (A) 04/11/2020 0153    Sepsis Labs: Lactic Acid, Venous    Component Value Date/Time   LATICACIDVEN 1.0 03/09/2020 0959    MICROBIOLOGY: No results found for this or any previous visit (from the past 240 hour(s)).  RADIOLOGY STUDIES/RESULTS: No results found.   LOS: 65 days   Oren Binet, MD  [facility]    To contact the attending provider between 7A-7P or the covering provider during after hours 7P-7A, please log into the web site [email or web address] and access using universal [place] password for that web site. If you do not have the password, please call the hospital operator.  05/11/2020, 11:50 AM

## 2020-05-11 NOTE — Progress Notes (Signed)
OT Cancellation Note  Patient Details Name: Kenneth Sullivan MRN: [id number] DOB: 21-May-1956   Cancelled Treatment:    Reason Eval/Treat Not Completed: Patient declined, no reason specified;Fatigue/lethargy limiting ability to participate Pt initially agreeable to therapy then changing his mind stating "Please let it be one more day". Therapist reiterated the importance of therapy, which pt stated he understood but was unaware that therapist has now seen pt on a number of occasions. Recommending decreased frequency due to lack of participation. Will continue to follow acutely.   Corinne Ports [place]. [place], [facility] [place] 05/11/2020, 10:04 AM

## 2020-05-11 NOTE — H&P (View-Only) (Signed)
[facility] Gastroenterology Progress Note  Kenneth Sullivan 64 y.o. 07-23-1956  CC: neuroendocrine tumor (per liver biopsy 05/03/20), gastric lesion (per EGD 03/07/2020)  Subjective: Patient denies abdominal pain, nausea, vomiting, melena, and hematochezia.  ROS : Review of Systems  Constitutional: Negative for chills and fever.  Gastrointestinal: Negative for abdominal pain, blood in stool, constipation, diarrhea, heartburn, melena, nausea and vomiting.   Objective: Vital signs in last 24 hours: Vitals:   05/11/20 0100 05/11/20 0755  BP: 94/69 99/70  Pulse:  100  Resp:  19  Temp:  98.7 F (37.1 C)  SpO2:  96%    Physical Exam:  General:  Lethargic, thin, no distress  Head:  Normocephalic, without obvious abnormality, atraumatic  Eyes:  Anicteric sclera, EOMs intact  Lungs:   Clear to auscultation bilaterally, respirations unlabored  Heart:  Regular rate and rhythm, S1, S2 normal  Abdomen:   Soft, moderate epigastric tenderness with guarding, bowel sounds active all four quadrants,  No peritoneal signs  Extremities: Extremities normal, atraumatic, no  edema  Pulses: 2+ and symmetric    Lab Results: Recent Labs    05/10/20 0312  NA 138  K 3.9  CL 103  CO2 26  GLUCOSE 89  BUN 15  CREATININE 0.86  CALCIUM 8.5*   No results for input(s): AST, ALT, ALKPHOS, BILITOT, PROT, ALBUMIN in the last 72 hours. Recent Labs    05/10/20 0312  WBC 6.9  NEUTROABS 4.6  HGB 9.8*  HCT 30.6*  MCV 91.1  PLT 470*   No results for input(s): LABPROT, INR in the last 72 hours.    Assessment: -Liver biopsy 05/03/20: neuroendocrine tumor -EGD 03/07/20: One partially obstructing oozing cratered gastric ulcer of significant severity with adherent clot was found at the incisura and in the gastric antrum. The lesion was 20 mm in largest dimension.   Plan: -EGD tomorrow with biopsy of gastric lesion to determine if he has primary gastric cancer.  I thoroughly discussed the procedure to include  benefits and risks (including but not limited to bleeding, infection, perforation, problems with anesthesia).  The patient gave verbal consent to proceed with EGD tomorrow. -Continue Protonix BID  [facility] will follow.  Salley Slaughter PA-C 05/11/2020, 8:47 AM  Contact #  [phone]

## 2020-05-11 NOTE — Progress Notes (Addendum)
[facility] Gastroenterology Progress Note  Kenneth Sullivan 64 y.o. 01-30-1956  CC: neuroendocrine tumor (per liver biopsy 05/03/20), gastric lesion (per EGD 03/07/2020)  Subjective: Patient denies abdominal pain, nausea, vomiting, melena, and hematochezia.  ROS : Review of Systems  Constitutional: Negative for chills and fever.  Gastrointestinal: Negative for abdominal pain, blood in stool, constipation, diarrhea, heartburn, melena, nausea and vomiting.   Objective: Vital signs in last 24 hours: Vitals:   05/11/20 0100 05/11/20 0755  BP: 94/69 99/70  Pulse:  100  Resp:  19  Temp:  98.7 F (37.1 C)  SpO2:  96%    Physical Exam:  General:  Lethargic, thin, no distress  Head:  Normocephalic, without obvious abnormality, atraumatic  Eyes:  Anicteric sclera, EOMs intact  Lungs:   Clear to auscultation bilaterally, respirations unlabored  Heart:  Regular rate and rhythm, S1, S2 normal  Abdomen:   Soft, moderate epigastric tenderness with guarding, bowel sounds active all four quadrants,  No peritoneal signs  Extremities: Extremities normal, atraumatic, no  edema  Pulses: 2+ and symmetric    Lab Results: Recent Labs    05/10/20 0312  NA 138  K 3.9  CL 103  CO2 26  GLUCOSE 89  BUN 15  CREATININE 0.86  CALCIUM 8.5*   No results for input(s): AST, ALT, ALKPHOS, BILITOT, PROT, ALBUMIN in the last 72 hours. Recent Labs    05/10/20 0312  WBC 6.9  NEUTROABS 4.6  HGB 9.8*  HCT 30.6*  MCV 91.1  PLT 470*   No results for input(s): LABPROT, INR in the last 72 hours.    Assessment: -Liver biopsy 05/03/20: neuroendocrine tumor -EGD 03/07/20: One partially obstructing oozing cratered gastric ulcer of significant severity with adherent clot was found at the incisura and in the gastric antrum. The lesion was 20 mm in largest dimension.   Plan: -EGD tomorrow with biopsy of gastric lesion to determine if he has primary gastric cancer.  I thoroughly discussed the procedure to include  benefits and risks (including but not limited to bleeding, infection, perforation, problems with anesthesia).  The patient gave verbal consent to proceed with EGD tomorrow. -Continue Protonix BID  [facility] will follow.  Salley Slaughter PA-C 05/11/2020, 8:47 AM  Contact #  [phone]

## 2020-05-12 ENCOUNTER — Other Ambulatory Visit: Payer: Self-pay

## 2020-05-12 ENCOUNTER — Inpatient Hospital Stay (HOSPITAL_COMMUNITY): Payer: Medicaid Other | Admitting: Anesthesiology

## 2020-05-12 ENCOUNTER — Encounter (HOSPITAL_COMMUNITY)
Admission: EM | Disposition: A | Discharge status: Skilled Nursing Facility | Payer: Self-pay | Source: Home / Self Care | Attending: Internal Medicine

## 2020-05-12 ENCOUNTER — Encounter (HOSPITAL_COMMUNITY): Payer: Self-pay | Admitting: Pulmonary Disease

## 2020-05-12 HISTORY — PX: ESOPHAGOGASTRODUODENOSCOPY: SHX5428

## 2020-05-12 HISTORY — PX: BIOPSY: SHX5522

## 2020-05-12 LAB — CBC
HCT: 31.2 % — ABNORMAL LOW (ref 39.0–52.0)
Hemoglobin: 10 g/dL — ABNORMAL LOW (ref 13.0–17.0)
MCH: 29 pg (ref 26.0–34.0)
MCHC: 32.1 g/dL (ref 30.0–36.0)
MCV: 90.4 fL (ref 80.0–100.0)
Platelets: 531 10*3/uL — ABNORMAL HIGH (ref 150–400)
RBC: 3.45 MIL/uL — ABNORMAL LOW (ref 4.22–5.81)
RDW: 16.9 % — ABNORMAL HIGH (ref 11.5–15.5)
WBC: 6.9 10*3/uL (ref 4.0–10.5)
nRBC: 0 % (ref 0.0–0.2)

## 2020-05-12 LAB — BASIC METABOLIC PANEL
Anion gap: 5 (ref 5–15)
BUN: 13 mg/dL (ref 8–23)
CO2: 27 mmol/L (ref 22–32)
Calcium: 8.8 mg/dL — ABNORMAL LOW (ref 8.9–10.3)
Chloride: 108 mmol/L (ref 98–111)
Creatinine, Ser: 0.84 mg/dL (ref 0.61–1.24)
GFR calc Af Amer: >60 mL/min (ref >60)
GFR calc non Af Amer: >60 mL/min (ref >60)
Glucose, Bld: 98 mg/dL (ref 70–99)
Potassium: 3.9 mmol/L (ref 3.5–5.1)
Sodium: 140 mmol/L (ref 135–145)

## 2020-05-12 LAB — GLUCOSE, CAPILLARY
Glucose-Capillary: 84 mg/dL (ref 70–99)
Glucose-Capillary: 74 mg/dL (ref 70–99)
Glucose-Capillary: 102 mg/dL — ABNORMAL HIGH (ref 70–99)

## 2020-05-12 LAB — SARS CORONAVIRUS 2 BY RT PCR (HOSPITAL ORDER, PERFORMED IN ~~LOC~~ HOSPITAL LAB): SARS Coronavirus 2: NEGATIVE

## 2020-05-12 SURGERY — EGD (ESOPHAGOGASTRODUODENOSCOPY)
Anesthesia: Monitor Anesthesia Care

## 2020-05-12 MED ORDER — DEXTROSE-NACL 5-0.9 % IV SOLN: INTRAVENOUS | Status: AC

## 2020-05-12 MED ORDER — PROPOFOL 500 MG/50ML IV EMUL: INTRAVENOUS | Status: DC | PRN

## 2020-05-12 MED ORDER — PROPOFOL 500 MG/50ML IV EMUL
INTRAVENOUS | Status: DC | PRN
Start: 2020-05-12 — End: 2020-05-12
  Administered 2020-05-12: 150 ug/kg/min via INTRAVENOUS

## 2020-05-12 MED ORDER — PHENYLEPHRINE 40 MCG/ML (10ML) SYRINGE FOR IV PUSH (FOR BLOOD PRESSURE SUPPORT)
PREFILLED_SYRINGE | INTRAVENOUS | Status: DC | PRN
  Administered 2020-05-12: 40 ug via INTRAVENOUS

## 2020-05-12 NOTE — Brief Op Note (Signed)
03/07/2020 - 05/12/2020  3:35 PM  PATIENT:  Kenneth Sullivan  64 y.o. male  PRE-OPERATIVE DIAGNOSIS:  gastric lesion  POST-OPERATIVE DIAGNOSIS:  gastric biopsy r/o carcinoid  PROCEDURE:  Procedure(s): ESOPHAGOGASTRODUODENOSCOPY (EGD) (N/A) BIOPSY  SURGEON:  Surgeon(s) and Role:    * Grabiel Schmutz, MD - Primary  Findings ------------- -EGD showed very large gastric mass extending from antrum into prepyloric area and pylorus and probably in the duodenal bulb.  It appeared fungating and ulcerated.  Biopsies taken.  Recommendations ------------------------- -Okay to resume previous diet -Follow biopsy results -EGD findings discussed with patient's niece over the phone. -GI will sign off.  Call us back if needed  Otis Brace MD, [place] 05/12/2020, 3:36 PM  Contact #  [phone]

## 2020-05-12 NOTE — Anesthesia Postprocedure Evaluation (Signed)
Anesthesia Post Note  Patient: Kenneth Sullivan  Procedure(s) Performed: ESOPHAGOGASTRODUODENOSCOPY (EGD) (N/A ) BIOPSY     Patient location during evaluation: PACU Anesthesia Type: MAC Level of consciousness: awake and alert Pain management: pain level controlled Vital Signs Assessment: post-procedure vital signs reviewed and stable Respiratory status: spontaneous breathing, nonlabored ventilation and respiratory function stable Cardiovascular status: stable and blood pressure returned to baseline Postop Assessment: no apparent nausea or vomiting Anesthetic complications: no    Last Vitals:  Vitals:   05/12/20 1536 05/12/20 1546  BP: 91/64 91/75  Pulse: 99 96  Resp: (!) 24 (!) 22  Temp:    SpO2: 97% 99%    Last Pain:  Vitals:   05/12/20 1546  TempSrc:   PainSc: 6                  Taevyn Hausen A.

## 2020-05-12 NOTE — Plan of Care (Signed)
  Problem: Health Behavior/Discharge Planning: Goal: Ability to manage health-related needs will improve Outcome: Not Progressing   Problem: Clinical Measurements: Goal: Will remain free from infection Outcome: Progressing Goal: Diagnostic test results will improve Outcome: Progressing Goal: Respiratory complications will improve Outcome: Progressing Goal: Cardiovascular complication will be avoided Outcome: Progressing   Problem: Nutrition: Goal: Adequate nutrition will be maintained Outcome: Progressing   Problem: Coping: Goal: Level of anxiety will decrease Outcome: Progressing   Problem: Elimination: Goal: Will not experience complications related to bowel motility Outcome: Progressing   Problem: Pain Managment: Goal: General experience of comfort will improve Outcome: Progressing   Problem: Safety: Goal: Ability to remain free from injury will improve Outcome: Progressing   Problem: Skin Integrity: Goal: Risk for impaired skin integrity will decrease Outcome: Progressing   Problem: Education: Goal: Knowledge of disease or condition will improve Outcome: Progressing Goal: Knowledge of secondary prevention will improve Outcome: Progressing Goal: Knowledge of patient specific risk factors addressed and post discharge goals established will improve Outcome: Progressing Goal: Individualized Educational Video(s) Outcome: Progressing   Problem: Coping: Goal: Will verbalize positive feelings about self Outcome: Progressing Goal: Will identify appropriate support needs Outcome: Progressing   Problem: Health Behavior/Discharge Planning: Goal: Ability to manage health-related needs will improve Outcome: Progressing

## 2020-05-12 NOTE — Care Management (Addendum)
11:00 Verified with MD that patient is medically ready for DC.  Patient demographics indicate Medicaid coverage. LVM w Levander Campion, [place] liaison notifying that patient is medically ready for DC. Awaiting call back. If able to take, will need new COVID test, requested order in preporation for DC.  1400 Notified by MD that patient's niece [name] would like [place] to see if a SNF closer to her in [facility] would also be willing to consider the patient for admission.  Attempted to call [name] at all numbers below, unable to reach her. Additional barriers to discharge in [facility] will be transportation. This distance over 50 miles would not be covered by [employer].  [place],Gloria Niece [phone]  [phone]

## 2020-05-12 NOTE — Progress Notes (Signed)
PROGRESS NOTE        PATIENT DETAILS Name: Kenneth Sullivan Age: 64 y.o. Sex: male Date of Birth: 1956-03-20 Admit Date: 03/07/2020 Admitting Physician Marshell Garfinkel, MD [id number], [name]  Brief Narrative: 64 year old with no significant past medical history.  Had acute shortness of breath with sats in the 40s on EMS arrival.  He had a witnessed bradycardic, asystolic arrest when EMS was present with 3 rounds of epi, 20 minutes to ROSC. Underwent normothermia protocol.  Work-up significant for massive PE, DVT with RV strain-unfortunately for the hospital course complicated by upper GI bleeding due to gastric ulcer.  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  Significant events: 3/23>> Admit, EGD with findings of oozing gastric ulcer with clot, IR for mechanical thrombectomy, IVC filter and GDA embolization 4/5>>Trach 4/28>>trach decannulated  Significant studies: CTA 3/23 >> bilateral PE with RV/with ratio 1.9, emphysema CT head 3/23 >> chronic microvascular changes.  No acute findings Echo 3/23 >> RV is severely dilated with reduced function and D-shaped septum, LVEF 60-65%. Venogram 3/23>> occlusive DVT of the iliac vein extending to the lower IVC LE Venous Duplex 3/24 >> acute DVT in BLE up to the femoral vein  MRI Brain 3/28 >> multifocal acute to subacute ischemic infarcts involving bilateral cerebral hemispheres, findings consistent with global hypoperfusion event related to cardiac arrest, associated scattered petechial hemorrhage about many areas of ischemia, evidence of hemorrhagic conversion in the right parietal lobe  Antimicrobial therapy: Doxycycline: 4/9>> 4/17  Microbiology data: 3/23: Blood culture>> negative so far 4/10: Tracheal aspirate culture>> normal respiratory flora 4/10: Blood culture>> no growth 4/27: Urine culture>>  Proteus mirabilis 3/23:Sars COV2 3 >> negative  Surgical pathology: 5/19 >>liver biopsy: Neuroendocrine tumor  Procedures : 3/23-3/24>> LTM EEG: No seizures-moderate to severe diffuse encephalopathy 3/23>> spot EEG: No seizures, severe to profound diffuse encephalopathy 3/23>> placement of retrievable IVC filter, pulmonary angiogram and mechanical/aspiration thrombectomy 3/23 >>EGD >> large gastric ulcer 3/23 >>embolization of right gastric artery/gastroduodenal artery 3/23 by IR>> 3/23 >>4/5 ETT 4/5>>Trach >>changed to 6 cuffless 4/22 >> decannulated 4/28 5/19>> ultrasound-guided liver biopsy  Consults: CCM, IR, neurology, cardiology, oncology, [facility]  DVT Prophylaxis : SCD's  Subjective: Answers simple questions-but is pleasantly confused.  Assessment/Plan: Acute hypoxic respiratory failure in the setting of PE/cardiac arrest: Managed in the ICU-s/p tracheotomy-subsequently decannulated-currently stable on room air.  Cardiac arrest: Secondary to massive PE-continue telemetry monitoring.  Patient is not a long-term anticoagulation candidate given large gastric ulcer-secondary to neuroendocrine tumor-given risk of massive GI bleeding.  Massive PE with DVT- s/p IVC filter: Not anticoagulation candidate-given malignant gastric ulcer with high risk potential for bleeding.  Is s/p IVC filter placement and mechanical thrombectomy.  Anoxic encephalopathy: Pleasantly confused-able to answer simple questions.  Upper GI bleeding secondary to large malignant gastric ulcer with acute blood loss anemia: Underwent EGD-and subsequently embolization of GDA by IR.  No further bleeding apparent-on PPI.  Hemoglobin stable.  Neuroendocrine tumor with liver metastases: Difficult situation-with anoxic encephalopathy and unable to process/understand malignant issue-will be very challenging to pursue aggressive therapy.  Long discussion with oncology-Dr. Benay Spice yesterday-we both agree  that it is probably best in this current condition to pursue palliative/hospice care.  I was finally able to talk with the patient's niece over the  phone this morning (she works third shift and needs to rest during the daytime)-she is a CNA-and appears to have some understanding of cancer/medical background.  I talked to her about recommendations from oncology regarding hospice/palliative care-she seemed to understand-and was going to talk with the patient's sister.  She is aware that we are awaiting SNF placement at this point.  We will follow up.  Dysphagia: Secondary to anoxia-SLP following.  Delusions/hallucinations: Psych consulted-currently relatively stable-ICU delirium likely a component-continue antipsychotics.  Nutrition Problem: Nutrition Problem: Severe Malnutrition Etiology: chronic illness(gastric lesion, multiple liver lesions, concern for metastatic disease) Signs/Symptoms: severe fat depletion, severe muscle depletion Interventions: Ensure Enlive (each supplement provides 350kcal and 20 grams of protein), Magic cup   Diet: Diet Order            Diet NPO time specified  Diet effective midnight               Code Status:  DNR  Family Communication: Spoke with niece-cell [id number] 5/28  Disposition Plan: Status is: Inpatient  Remains inpatient appropriate because:Inpatient level of care appropriate due to severity of illness  Dispo: The patient is from: Home              Anticipated d/c is to: SNF              Anticipated d/c date is: 1 day              Patient currently is medically stable to d/c.   Barriers to Discharge: Awaiting SNF bed.  Antimicrobial agents: Anti-infectives (From admission, onward)   Start     Dose/Rate Route Frequency Ordered Stop   03/26/20 1100  doxycycline (VIBRAMYCIN) 100 mg in sodium chloride 0.9 % 250 mL IVPB  Status:  Discontinued     100 mg 125 mL/hr over 120 Minutes Intravenous 2 times daily 03/26/20 1002 04/01/20 1035     03/25/20 1100  doxycycline (VIBRA-TABS) tablet 100 mg  Status:  Discontinued     100 mg Per Tube Every 12 hours 03/25/20 1009 03/26/20 1002   03/25/20 1015  doxycycline (VIBRAMYCIN) 100 mg in sodium chloride 0.9 % 250 mL IVPB  Status:  Discontinued    Note to Pharmacy: To be given after the tracheal aspirate is collected.   100 mg 125 mL/hr over 120 Minutes Intravenous Every 12 hours 03/25/20 1005 03/25/20 1008       Time spent: 15- minutes-Greater than 50% of this time was spent in counseling, explanation of diagnosis, planning of further management, and coordination of care.  MEDICATIONS: Scheduled Meds: . chlorhexidine gluconate (MEDLINE KIT)  15 mL Mouth Rinse BID  . feeding supplement (ENSURE ENLIVE)  237 mL Oral QID  . FLUoxetine  10 mg Oral QHS  . OXcarbazepine  75 mg Oral BID  . pantoprazole  40 mg Oral BID  . polyethylene glycol  17 g Oral Daily  . QUEtiapine  50 mg Oral BID   Continuous Infusions: . sodium chloride    . sodium chloride 20 mL/hr at 05/12/20 0539   PRN Meds:.sodium chloride, acetaminophen, docusate sodium, guaiFENesin-dextromethorphan, hydrALAZINE, ipratropium-albuterol, LORazepam, LORazepam **OR** [DISCONTINUED] LORazepam, oxyCODONE, sodium chloride flush   PHYSICAL EXAM: Vital signs: Vitals:   05/11/20 1642 05/11/20 2057 05/12/20 0528 05/12/20 0806  BP: 106/72 104/80 95/69 95/74   Pulse: 95  82 90  Resp: 19  18 16   Temp: 98.1 F (36.7 C) 98.3 F (36.8 C) 98.1 F (36.7 C)   TempSrc:  Oral Oral   SpO2:  96%  95% 97%  Weight:      Height:       Filed Weights   04/29/20 1557 04/30/20 0500 05/01/20 0648  Weight: 63 kg 63.9 kg 63 kg   Body mass index is 17.85 kg/m.   Gen Exam: Confused but not in any distress. HEENT:atraumatic, normocephalic Chest: B/L clear to auscultation anteriorly CVS:S1S2 regular Abdomen:soft non tender, non distended Extremities:no edema Neurology: Non focal Skin: no rash  I have personally reviewed following  labs and imaging studies  LABORATORY DATA: CBC: Recent Labs  Lab 05/06/20 1135 05/10/20 0312 05/12/20 0310  WBC 8.5 6.9 6.9  NEUTROABS 6.1 4.6  --   HGB 9.9* 9.8* 10.0*  HCT 31.2* 30.6* 31.2*  MCV 92.6 91.1 90.4  PLT 431* 470* 531*    Basic Metabolic Panel: Recent Labs  Lab 05/06/20 1135 05/10/20 0312 05/12/20 0310  NA 138 138 140  K 3.9 3.9 3.9  CL 105 103 108  CO2 26 26 27   GLUCOSE 95 89 98  BUN 15 15 13   CREATININE 0.80 0.86 0.84  CALCIUM 8.4* 8.5* 8.8*    GFR: Estimated Creatinine Clearance: 81.4 mL/min (by C-G formula based on SCr of 0.84 mg/dL).  Liver Function Tests: No results for input(s): AST, ALT, ALKPHOS, BILITOT, PROT, ALBUMIN in the last 168 hours. No results for input(s): LIPASE, AMYLASE in the last 168 hours. No results for input(s): AMMONIA in the last 168 hours.  Coagulation Profile: No results for input(s): INR, PROTIME in the last 168 hours.  Cardiac Enzymes: No results for input(s): CKTOTAL, CKMB, CKMBINDEX, TROPONINI in the last 168 hours.  BNP (last 3 results) No results for input(s): PROBNP in the last 8760 hours.  Lipid Profile: No results for input(s): CHOL, HDL, LDLCALC, TRIG, CHOLHDL, LDLDIRECT in the last 72 hours.  Thyroid Function Tests: No results for input(s): TSH, T4TOTAL, FREET4, T3FREE, THYROIDAB in the last 72 hours.  Anemia Panel: No results for input(s): VITAMINB12, FOLATE, FERRITIN, TIBC, IRON, RETICCTPCT in the last 72 hours.  Urine analysis:    Component Value Date/Time   COLORURINE AMBER (A) 04/11/2020 0153   APPEARANCEUR CLOUDY (A) 04/11/2020 0153   LABSPEC 1.024 04/11/2020 0153   PHURINE 7.0 04/11/2020 0153   GLUCOSEU NEGATIVE 04/11/2020 0153   HGBUR SMALL (A) 04/11/2020 0153   BILIRUBINUR NEGATIVE 04/11/2020 0153   KETONESUR NEGATIVE 04/11/2020 0153   PROTEINUR 100 (A) 04/11/2020 0153   NITRITE NEGATIVE 04/11/2020 0153   LEUKOCYTESUR LARGE (A) 04/11/2020 0153    Sepsis Labs: Lactic Acid, Venous     Component Value Date/Time   LATICACIDVEN 1.0 03/09/2020 0959    MICROBIOLOGY: No results found for this or any previous visit (from the past 240 hour(s)).  RADIOLOGY STUDIES/RESULTS: No results found.   LOS: 39 days   Oren Binet, MD  [facility]    To contact the attending provider between 7A-7P or the covering provider during after hours 7P-7A, please log into the web site [email or web address] and access using universal [place] password for that web site. If you do not have the password, please call the hospital operator.  05/12/2020, 12:09 PM

## 2020-05-12 NOTE — Interval H&P Note (Signed)
History and Physical Interval Note:  05/12/2020 2:58 PM  Kenneth Sullivan  has presented today for surgery, with the diagnosis of gastric lesion.  The various methods of treatment have been discussed with the patient and family. After consideration of risks, benefits and other options for treatment, the patient has consented to  Procedure(s): ESOPHAGOGASTRODUODENOSCOPY (EGD) (N/A) as a surgical intervention.  The patient's history has been reviewed, patient examined, no change in status, stable for surgery.  I have reviewed the patient's chart and labs.  Questions were answered to the patient's satisfaction.    Patient seen and examined at bedside in the endoscopy unit.  Called and discussed with niece earlier today.  Chart reviewed.  Risks (bleeding, infection, bowel perforation that could require surgery, sedation-related changes in cardiopulmonary systems), benefits (identification and possible treatment of source of symptoms, exclusion of certain causes of symptoms), and alternatives (watchful waiting, radiographic imaging studies, empiric medical treatment)  were explained to patient/family in detail and patient wishes to proceed.    Kenneth Sullivan

## 2020-05-12 NOTE — Anesthesia Preprocedure Evaluation (Addendum)
Anesthesia Evaluation  Patient identified by MRN, date of birth, ID band Patient awake    Reviewed: Allergy & Precautions, NPO status , Patient's Chart, lab work & pertinent test results  Airway Mallampati: III  TM Distance: >3 FB Neck ROM: Full    Dental  (+) Edentulous Upper, Caps,    Pulmonary Current Smoker and Patient abstained from smoking., PE Hx/o Bilateral PE Hx/o Tracheostomy   + rhonchi  + decreased breath sounds      Cardiovascular + DVT  Normal cardiovascular exam Rhythm:Regular Rate:Normal  Hx/o bilateral lower extremity DVT's S/P IVC filter Hx/o PEA cardiac arrest with ROSC after 20 min   Neuro/Psych CVA, Residual Symptoms negative psych ROS   GI/Hepatic Neg liver ROS, PUD, Antral mass- bx high grade neuroendocrine tumor with metastasis to liver   Endo/Other  negative endocrine ROS  Renal/GU negative Renal ROS  negative genitourinary   Musculoskeletal negative musculoskeletal ROS (+)   Abdominal   Peds  Hematology  (+) anemia ,   Anesthesia Other Findings   Reproductive/Obstetrics                          Anesthesia Physical Anesthesia Plan  ASA: III  Anesthesia Plan: MAC   Post-op Pain Management:    Induction: Intravenous  PONV Risk Score and Plan: 1 and Ondansetron, Treatment may vary due to age or medical condition and Propofol infusion  Airway Management Planned: Natural Airway and Nasal Cannula  Additional Equipment:   Intra-op Plan:   Post-operative Plan:   Informed Consent: I have reviewed the patients History and Physical, chart, labs and discussed the procedure including the risks, benefits and alternatives for the proposed anesthesia with the patient or authorized representative who has indicated his/her understanding and acceptance.    Discussed DNR with patient and Continue DNR.   Dental advisory given  Plan Discussed with: CRNA and  Surgeon  Anesthesia Plan Comments:         Anesthesia Quick Evaluation

## 2020-05-12 NOTE — Transfer of Care (Signed)
Immediate Anesthesia Transfer of Care Note  Patient: Kenneth Sullivan  Procedure(s) Performed: ESOPHAGOGASTRODUODENOSCOPY (EGD) (N/A ) BIOPSY  Patient Location: PACU and Endoscopy Unit  Anesthesia Type:MAC  Level of Consciousness: awake and alert   Airway & Oxygen Therapy: Patient Spontanous Breathing and Patient connected to nasal cannula oxygen  Post-op Assessment: Report given to RN and Post -op Vital signs reviewed and stable  Post vital signs: Reviewed and stable  Last Vitals:  Vitals Value Taken Time  BP    Temp    Pulse    Resp    SpO2      Last Pain:  Vitals:   05/12/20 1437  TempSrc: Oral  PainSc: 0-No pain      Patients Stated Pain Goal: 0 (60/73/71 0626)  Complications: No apparent anesthesia complications

## 2020-05-12 NOTE — Op Note (Signed)
[facility] Patient Name: Kenneth Sullivan Procedure Date : 05/12/2020 MRN: [id number] Attending MD: Otis Brace , MD Date of Birth: Dec 13, 1956 CSN: [id number] Age: 64 Admit Type: Inpatient Procedure:                Upper GI endoscopy Indications:              Abnormal CT of the GI tract, Gastric mass Providers:                Otis Brace, MD, Clyde Lundborg, RN, Glori Bickers, RN, Elspeth Cho Tech., Technician, Lelon Perla, CRNA Referring MD:              Medicines:                Sedation Administered by an Anesthesia Professional Complications:            No immediate complications. Estimated Blood Loss:     Estimated blood loss was minimal. Procedure:                Pre-Anesthesia Assessment:                           - Prior to the procedure, a History and Physical                            was performed, and patient medications and                            allergies were reviewed. The patient's tolerance of                            previous anesthesia was also reviewed. The risks                            and benefits of the procedure and the sedation                            options and risks were discussed with the patient.                            All questions were answered, and informed consent                            was obtained. Prior Anticoagulants: The patient has                            taken no previous anticoagulant or antiplatelet                            agents. ASA Grade Assessment: III - A patient with  severe systemic disease. After reviewing the risks                            and benefits, the patient was deemed in                            satisfactory condition to undergo the procedure.                           After obtaining informed consent, the endoscope was                            passed under direct vision. Throughout the                             procedure, the patient's blood pressure, pulse, and                            oxygen saturations were monitored continuously. The                            [id number] Olympus gastroscope was                            introduced through the mouth, and advanced to the                            second part of duodenum. The upper GI endoscopy was                            accomplished without difficulty. The patient                            tolerated the procedure well. Scope In: Scope Out: Findings:      The Z-line was regular and was found 39 cm from the incisors.      No gross lesions were noted in the entire esophagus.      A large, frond-like/villous, fungating, infiltrative and ulcerated,       partially circumferential (involving two-thirds of the lumen       circumference) mass with no bleeding and stigmata of recent bleeding was       found in the gastric antrum. Biopsies were taken with a cold forceps for       histology.Gastric mass extends from antrum into prepyloric area and into       pylorus and probably in the Duodenal Bulb. I was not able to see the       duodenal bulb clearly.      The cardia and gastric fundus were normal on retroflexion.      The first portion of the duodenum and second portion of the duodenum       were normal.      Food (residue) was found in the gastric fundus. Impression:               - Z-line regular, 39 cm from the incisors.                           -  No gross lesions in esophagus.                           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 Biopsied.                           - Normal first portion of the duodenum and second                            portion of the duodenum.                           - Food (residue) in the stomach. Recommendation:           - Return patient to hospital ward for ongoing care.                           - Resume previous diet.                            - Continue present medications.                           - Await pathology results. Procedure Code(s):        --- Professional ---                           [phone], Esophagogastroduodenoscopy, flexible,                            transoral; with biopsy, single or multiple Diagnosis Code(s):        --- Professional ---                           D49.0, Neoplasm of unspecified behavior of                            digestive system                           R93.3, Abnormal findings on diagnostic imaging of                            other parts of digestive tract CPT copyright 2019 [employer]. All rights reserved. The codes documented in this report are preliminary and upon coder review may  be revised to meet current compliance requirements. Otis Brace, MD Otis Brace, MD 05/12/2020 3:23:48 PM Number of Addenda: 0

## 2020-05-12 NOTE — Progress Notes (Signed)
HEMATOLOGY-ONCOLOGY PROGRESS NOTE  SUBJECTIVE: He is lethargic this morning.  No family is in the room. PHYSICAL EXAMINATION:  Vitals:   05/12/20 1532 05/12/20 1536  BP:  91/64  Pulse: 96 99  Resp: (!) 28 (!) 24  Temp:    SpO2: 97% 97%   Filed Weights   04/29/20 1557 04/30/20 0500 05/01/20 0648  Weight: 139 lb (63 kg) 140 lb 14 oz (63.9 kg) 139 lb (63 kg)    Intake/Output from previous day: No intake/output data recorded.  GENERAL: Lethargic following pain medication, not answering questions  LABORATORY DATA:  I have reviewed the data as listed CMP Latest Ref Rng & Units 05/12/2020 05/10/2020 05/06/2020  Glucose 70 - 99 mg/dL 98 89 95  BUN 8 - 23 mg/dL 13 15 15   Creatinine 0.61 - 1.24 mg/dL 0.84 0.86 0.80  Sodium 135 - 145 mmol/L 140 138 138  Potassium 3.5 - 5.1 mmol/L 3.9 3.9 3.9  Chloride 98 - 111 mmol/L 108 103 105  CO2 22 - 32 mmol/L 27 26 26   Calcium 8.9 - 10.3 mg/dL 8.8(L) 8.5(L) 8.4(L)  Total Protein 6.5 - 8.1 g/dL - - -  Total Bilirubin 0.3 - 1.2 mg/dL - - -  Alkaline Phos 38 - 126 U/L - - -  AST 15 - 41 U/L - - -  ALT 0 - 44 U/L - - -    Lab Results  Component Value Date   WBC 6.9 05/12/2020   HGB 10.0 (L) 05/12/2020   HCT 31.2 (L) 05/12/2020   MCV 90.4 05/12/2020   PLT 531 (H) 05/12/2020   NEUTROABS 4.6 05/10/2020    CT ABDOMEN PELVIS W CONTRAST  Result Date: 05/01/2020 CLINICAL DATA:  Acute abdominal pain EXAM: CT ABDOMEN AND PELVIS WITH CONTRAST TECHNIQUE: Multidetector CT imaging of the abdomen and pelvis was performed using the standard protocol following bolus administration of intravenous contrast. CONTRAST:  127m OMNIPAQUE IOHEXOL 300 MG/ML  SOLN COMPARISON:  03/23/2020 FINDINGS: Lower chest: Trace dependent right pleural effusion. Lung bases otherwise clear. Normal heart size. No pericardial effusion. No hiatal hernia. Hepatobiliary: New hypodense hepatic lesions bilaterally. Left hepatic dome lesion measures 5.4 cm. Peripheral right hepatic lesion  measures 2.4 cm. Anterior right hepatic dome lesion measures 1 cm. These are all measured on image 12 series 3. Lesions are compatible with new hepatic metastases. Focal fat noted along the falciform ligament anteriorly as before. No intrahepatic biliary dilatation. Hepatic and portal veins are patent. Gallbladder collapsed. Pancreas: Unremarkable. No pancreatic ductal dilatation or surrounding inflammatory changes. Spleen: Overall small in size. No focal abnormality. No parenchymal lesion. Adrenals/Urinary Tract: Normal adrenal glands. No renal obstruction or hydronephrosis. Posterior left upper pole renal cyst measures 8 cm. No hydroureter or urinary tract calculus. Bladder under distended. Stomach/Bowel: Diffuse hypodense transmural wall thickening of the stomach antrum, image 24 series 3 and image 10 series 8 suspicious for a gastric antral mass, predominant along the posterior wall roughly measuring 7.1 x 3.3 cm, image 9 series 8. Proximal stomach is distended. Difficult to exclude some component of partial gastric outlet obstruction. There is air and contrast within the small intestine more distally. No small bowel obstruction pattern. No free air. No significant free fluid, ascites, hemorrhage, hematoma or abscess. Vascular/Lymphatic: Infrarenal and aortic bifurcation atherosclerosis without aneurysm or dissection. No occlusive process. Mesenteric and renal vasculature appear patent. IVC filter noted. No bulky adenopathy. Reproductive: No significant finding by CT. Other: No inguinal or abdominal wall hernia. Musculoskeletal: Degenerative changes of the spine  and lower lumbar facet arthropathy. 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 Consider GI consultation and repeat endoscopy. Aortic Atherosclerosis (ICD10-I70.0). These results will be called to the ordering clinician or representative by the Radiologist Assistant, and  communication documented in the PACS or [employer]. Electronically Signed   By: Jerilynn Mages.  Shick M.D.   On: 05/01/2020 16:02   US BIOPSY (LIVER)  Result Date: 05/03/2020 INDICATION: 64 year old male with multiple liver lesions and concern for metastatic disease EXAM: ULTRASOUND-GUIDED BIOPSY LEFT LIVER MASS MEDICATIONS: None. ANESTHESIA/SEDATION: Moderate (conscious) sedation was employed during this procedure. A total of Versed 2.0 mg and Fentanyl 100 mcg was administered intravenously. Moderate Sedation Time: 10 minutes. The patient's level of consciousness and vital signs were monitored continuously by radiology nursing throughout the procedure under my direct supervision. FLUOROSCOPY TIME:  None). COMPLICATIONS: None PROCEDURE: Informed written consent was obtained from the patient after a thorough discussion of the procedural risks, benefits and alternatives. All questions were addressed. Maximal Sterile Barrier Technique was utilized including caps, mask, sterile gowns, sterile gloves, sterile drape, hand hygiene and skin antiseptic. A timeout was performed prior to the initiation of the procedure Ultrasound survey of the bilateral liver lobe performed with images stored and sent to PACs. The subxiphoid region of the abdomen was prepped with chlorhexidine in a sterile fashion, and a sterile drape was applied covering the operative field. A sterile gown and sterile gloves were used for the procedure. Local anesthesia was provided with 1% Lidocaine. Once the patient is prepped and draped sterilely and the skin and subcutaneous tissues were generously infiltrated with 1% lidocaine. A 17 gauge introducer needle was advanced into the left liver lobe targeting heterogeneously echoic mass of the left liver. The stylet was removed, and multiple 18 gauge core biopsy were retrieved. Samples were placed into formalin for transportation to the lab. Gel-Foam pledgets were then infused with a small amount of saline for  assistance with hemostasis. The needle was removed, and a final ultrasound image was performed. The patient tolerated the procedure well and remained hemodynamically stable throughout. No complications were encountered and no significant blood loss was encounter. IMPRESSION: Status post ultrasound-guided biopsy of left liver mass. Signed, Dulcy Fanny. Dellia Nims, RPVI [facility] Radiology Electronically Signed   By: Corrie Mckusick D.O.   On: 05/03/2020 16:16   DG Abd 2 Views  Result Date: 04/28/2020 CLINICAL DATA:  64 year old male with abdominal pain. History of GI bleeding treated with mesenteric embolization. EXAM: ABDOMEN - 2 VIEW COMPARISON:  Noncontrast CT Abdomen and Pelvis 03/23/2020. Abdominal radiographs 04/11/2020 and earlier. FINDINGS: Three supine views of the abdomen and pelvis. Negative lung bases. Enteric feeding tube removed from last month. Stable IVC filter and epigastric embolization coils. Non obstructed bowel gas pattern. Decreased retained stool in the large bowel from last month. No acute osseous abnormality identified. IMPRESSION: No acute findings. Non obstructed bowel gas pattern with decreased retained stool in the colon from last month. Electronically Signed   By: Genevie Ann M.D.   On: 04/28/2020 12:46    ASSESSMENT AND PLAN: 1.    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  Tracheostomy placement on 03/20/2020, now  healed 4.  Cardiac arrest on 03/07/2020 5.  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  History of acute blood loss anemia likely due to upper GI bleed 7.  Delusions/hallucinations  Mr. Jernigan been diagnosed with a well-differentiated neuroendocrine tumor involving a liver biopsy.  The tumor appears to have arisen in the stomach and the KIA-6 7 returned moderate to high-grade.  He has significant brain injury following the cardiac arrest.  I discussed the case with his niece, [name], today.  She is the daughter of his sister.  He does not have family in [place].  She indicates that she would like to care for him in her home.  I explained his current status.  We discussed the treatment options for the metastatic neuroendocrine tumor including tyrosine kinase inhibitors and systemic chemotherapy.  She understands Mr. Irby may not be a candidate for treatment due to his poor performance status and altered mentation.  She lives in [facility] and would like for him to be admitted to a nursing facility there.  We can arrange for oncology follow-up in [facility] if a nursing home transfer can be arranged.  Recommendations: 1.  Social work consult for discussions with family regarding nursing home placement, ideally close to [facility]  2.  Please call oncology as needed, outpatient follow-up will be scheduled at the cancer center here or elsewhere depending on his placement location   LOS: 66 days   Betsy Coder, MD 05/12/20

## 2020-05-13 LAB — GLUCOSE, CAPILLARY
Glucose-Capillary: 98 mg/dL (ref 70–99)
Glucose-Capillary: 96 mg/dL (ref 70–99)
Glucose-Capillary: 127 mg/dL — ABNORMAL HIGH (ref 70–99)
Glucose-Capillary: 109 mg/dL — ABNORMAL HIGH (ref 70–99)

## 2020-05-13 NOTE — Progress Notes (Signed)
PROGRESS NOTE        PATIENT DETAILS Name: Kenneth Sullivan Age: 64 y.o. Sex: male Date of Birth: 1956-03-17 Admit Date: 03/07/2020 Admitting Physician Marshell Garfinkel, MD BTY:[facility], [name]  Brief Narrative: 64 year old with no significant past medical history.  Had acute shortness of breath with sats in the 40s on EMS arrival.  He had a witnessed bradycardic, asystolic arrest when EMS was present with 3 rounds of epi, 20 minutes to ROSC. Underwent normothermia protocol.  Work-up significant for massive PE, DVT with RV strain-unfortunately for the hospital course complicated by upper GI bleeding due to gastric ulcer.  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  Significant events: 3/23>> Admit, EGD with findings of oozing gastric ulcer with clot, IR for mechanical thrombectomy, IVC filter and GDA embolization 4/5>>Trach 4/28>>trach decannulated  Significant studies: CTA 3/23 >> bilateral PE with RV/with ratio 1.9, emphysema CT head 3/23 >> chronic microvascular changes.  No acute findings Echo 3/23 >> RV is severely dilated with reduced function and D-shaped septum, LVEF 60-65%. Venogram 3/23>> occlusive DVT of the iliac vein extending to the lower IVC LE Venous Duplex 3/24 >> acute DVT in BLE up to the femoral vein  MRI Brain 3/28 >> multifocal acute to subacute ischemic infarcts involving bilateral cerebral hemispheres, findings consistent with global hypoperfusion event related to cardiac arrest, associated scattered petechial hemorrhage about many areas of ischemia, evidence of hemorrhagic conversion in the right parietal lobe  Antimicrobial therapy: Doxycycline: 4/9>> 4/17  Microbiology data: 3/23: Blood culture>> negative so far 4/10: Tracheal aspirate culture>> normal respiratory flora 4/10: Blood culture>> no growth 4/27: Urine culture>>  Proteus mirabilis 3/23:Sars COV2 3 >> negative  Surgical pathology: 5/19 >>liver biopsy: Neuroendocrine tumor 5/28>> gastric ulcer biopsy by EGD-pending  Procedures : 3/23-3/24>> LTM EEG: No seizures-moderate to severe diffuse encephalopathy 3/23>> spot EEG: No seizures, severe to profound diffuse encephalopathy 3/23>> placement of retrievable IVC filter, pulmonary angiogram and mechanical/aspiration thrombectomy 3/23 >>EGD >> large gastric ulcer 3/23 >>embolization of right gastric artery/gastroduodenal artery 3/23 by IR>> 3/23 >>4/5 ETT 4/5>>Trach >>changed to 6 cuffless 4/22 >> decannulated 4/28 5/19>> ultrasound-guided liver biopsy 5/28>> EGD with biopsy of gastric ulcer  Consults: CCM, IR, neurology, cardiology, oncology, [facility]  DVT Prophylaxis : SCD's  Subjective: He remains pleasantly confused. No major events overnight.  Assessment/Plan: Acute hypoxic respiratory failure in the setting of PE/cardiac arrest: Managed in the ICU-s/p tracheotomy-subsequently decannulated-currently stable on room air.  Cardiac arrest: Secondary to massive PE-continue telemetry monitoring.  Patient is not a long-term anticoagulation candidate given large gastric ulcer-secondary to neuroendocrine tumor-given risk of massive GI bleeding.  Massive PE with DVT- s/p IVC filter: Not anticoagulation candidate-given malignant gastric ulcer with high risk potential for bleeding.  Is s/p IVC filter placement and mechanical thrombectomy.  Anoxic encephalopathy: Pleasantly confused-able to answer simple questions.  Upper GI bleeding secondary to large malignant gastric ulcer with acute blood loss anemia: Underwent EGD x2-and subsequently embolization of GDA by IR.  No further bleeding apparent-on PPI.  Hemoglobin stable. EGD with biopsy on 5/28-biopsy pending  Neuroendocrine tumor with liver metastases: Difficult situation-with anoxic encephalopathy and unable to process/understand  malignant issue-will be very challenging to pursue aggressive therapy.  Long discussion with oncology-Dr. Benay Spice on 5/27 -we both agree that it  is probably best in this current condition to pursue palliative/hospice care. Family aware of our recommendation-family discussion in progress. No further inpatient work-up recommended by oncology-if patient is discharged to a facility here in town-patient will follow with oncology clinic at the cancer center-if discharged to a facility out of town-we will need local oncology follow-up.   Dysphagia: Secondary to anoxia-SLP following.  Delusions/hallucinations: Psych consulted-currently relatively stable-ICU delirium likely a component-continue antipsychotics.  Nutrition Problem: Nutrition Problem: Severe Malnutrition Etiology: chronic illness(gastric lesion, multiple liver lesions, concern for metastatic disease) Signs/Symptoms: severe fat depletion, severe muscle depletion Interventions: Ensure Enlive (each supplement provides 350kcal and 20 grams of protein), Magic cup   Diet: Diet Order            Diet regular Room service appropriate? Yes; Fluid consistency: Thin  Diet effective now               Code Status:  DNR  Family Communication: Spoke with niece [name] [phone] 5/28, no answer on 5/29  Disposition Plan: Status is: Inpatient  Remains inpatient appropriate because:Inpatient level of care appropriate due to severity of illness  Dispo: The patient is from: Home              Anticipated d/c is to: SNF              Anticipated d/c date is: 1 day              Patient currently is medically stable to d/c.   Barriers to Discharge: Awaiting SNF bed.  Antimicrobial agents: Anti-infectives (From admission, onward)   Start     Dose/Rate Route Frequency Ordered Stop   03/26/20 1100  doxycycline (VIBRAMYCIN) 100 mg in sodium chloride 0.9 % 250 mL IVPB  Status:  Discontinued     100 mg 125 mL/hr over 120 Minutes  Intravenous 2 times daily 03/26/20 1002 04/01/20 1035   03/25/20 1100  doxycycline (VIBRA-TABS) tablet 100 mg  Status:  Discontinued     100 mg Per Tube Every 12 hours 03/25/20 1009 03/26/20 1002   03/25/20 1015  doxycycline (VIBRAMYCIN) 100 mg in sodium chloride 0.9 % 250 mL IVPB  Status:  Discontinued    Note to Pharmacy: To be given after the tracheal aspirate is collected.   100 mg 125 mL/hr over 120 Minutes Intravenous Every 12 hours 03/25/20 1005 03/25/20 1008       Time spent: 15- minutes-Greater than 50% of this time was spent in counseling, explanation of diagnosis, planning of further management, and coordination of care.  MEDICATIONS: Scheduled Meds: . chlorhexidine gluconate (MEDLINE KIT)  15 mL Mouth Rinse BID  . feeding supplement (ENSURE ENLIVE)  237 mL Oral QID  . FLUoxetine  10 mg Oral QHS  . OXcarbazepine  75 mg Oral BID  . pantoprazole  40 mg Oral BID  . polyethylene glycol  17 g Oral Daily  . QUEtiapine  50 mg Oral BID   Continuous Infusions: . sodium chloride     PRN Meds:.sodium chloride, acetaminophen, docusate sodium, guaiFENesin-dextromethorphan, hydrALAZINE, ipratropium-albuterol, LORazepam, LORazepam **OR** [DISCONTINUED] LORazepam, oxyCODONE, sodium chloride flush   PHYSICAL EXAM: Vital signs: Vitals:   05/12/20 1546 05/12/20 1620 05/12/20 2100 05/13/20 0743  BP: 91/75 106/82 112/81 108/77  Pulse: 96 93 96 97  Resp: (!) 22 16  17   Temp:   97.6 F (36.4 C) 97.9 F (36.6 C)  TempSrc:   Oral   SpO2: 99% 96% 99% 96%  Weight:  Height:       Filed Weights   04/29/20 1557 04/30/20 0500 05/01/20 0648  Weight: 63 kg 63.9 kg 63 kg   Body mass index is 17.85 kg/m.   Gen Exam: Confused but not in any distress. HEENT:atraumatic, normocephalic Chest: B/L clear to auscultation anteriorly CVS:S1S2 regular Abdomen:soft non tender, non distended Extremities:no edema Neurology: Non focal Skin: no rash  I have personally reviewed following labs  and imaging studies  LABORATORY DATA: CBC: Recent Labs  Lab 05/06/20 1135 05/10/20 0312 05/12/20 0310  WBC 8.5 6.9 6.9  NEUTROABS 6.1 4.6  --   HGB 9.9* 9.8* 10.0*  HCT 31.2* 30.6* 31.2*  MCV 92.6 91.1 90.4  PLT 431* 470* 531*    Basic Metabolic Panel: Recent Labs  Lab 05/06/20 1135 05/10/20 0312 05/12/20 0310  NA 138 138 140  K 3.9 3.9 3.9  CL 105 103 108  CO2 26 26 27   GLUCOSE 95 89 98  BUN 15 15 13   CREATININE 0.80 0.86 0.84  CALCIUM 8.4* 8.5* 8.8*    GFR: Estimated Creatinine Clearance: 81.4 mL/min (by C-G formula based on SCr of 0.84 mg/dL).  Liver Function Tests: No results for input(s): AST, ALT, ALKPHOS, BILITOT, PROT, ALBUMIN in the last 168 hours. No results for input(s): LIPASE, AMYLASE in the last 168 hours. No results for input(s): AMMONIA in the last 168 hours.  Coagulation Profile: No results for input(s): INR, PROTIME in the last 168 hours.  Cardiac Enzymes: No results for input(s): CKTOTAL, CKMB, CKMBINDEX, TROPONINI in the last 168 hours.  BNP (last 3 results) No results for input(s): PROBNP in the last 8760 hours.  Lipid Profile: No results for input(s): CHOL, HDL, LDLCALC, TRIG, CHOLHDL, LDLDIRECT in the last 72 hours.  Thyroid Function Tests: No results for input(s): TSH, T4TOTAL, FREET4, T3FREE, THYROIDAB in the last 72 hours.  Anemia Panel: No results for input(s): VITAMINB12, FOLATE, FERRITIN, TIBC, IRON, RETICCTPCT in the last 72 hours.  Urine analysis:    Component Value Date/Time   COLORURINE AMBER (A) 04/11/2020 0153   APPEARANCEUR CLOUDY (A) 04/11/2020 0153   LABSPEC 1.024 04/11/2020 0153   PHURINE 7.0 04/11/2020 0153   GLUCOSEU NEGATIVE 04/11/2020 0153   HGBUR SMALL (A) 04/11/2020 0153   BILIRUBINUR NEGATIVE 04/11/2020 0153   KETONESUR NEGATIVE 04/11/2020 0153   PROTEINUR 100 (A) 04/11/2020 0153   NITRITE NEGATIVE 04/11/2020 0153   LEUKOCYTESUR LARGE (A) 04/11/2020 0153    Sepsis Labs: Lactic Acid, Venous      Component Value Date/Time   LATICACIDVEN 1.0 03/09/2020 0959    MICROBIOLOGY: Recent Results (from the past 240 hour(s))  SARS Coronavirus 2 by RT PCR (hospital order, performed in [place] hospital lab) Nasopharyngeal Nasopharyngeal Swab     Status: None   Collection Time: 05/12/20 12:01 PM   Specimen: Nasopharyngeal Swab  Result Value Ref Range Status   SARS Coronavirus 2 NEGATIVE NEGATIVE Final    Comment: (NOTE) SARS-CoV-2 target nucleic acids are NOT DETECTED. The SARS-CoV-2 RNA is generally detectable in upper and lower respiratory specimens during the acute phase of infection. The lowest concentration of SARS-CoV-2 viral copies this assay can detect is 250 copies / mL. A negative result does not preclude SARS-CoV-2 infection and should not be used as the sole basis for treatment or other patient management decisions.  A negative result may occur with improper specimen collection / handling, submission of specimen other than nasopharyngeal swab, presence of viral mutation(s) within the areas targeted by this assay, and  inadequate number of viral copies (<250 copies / mL). A negative result must be combined with clinical observations, patient history, and epidemiological information. Fact Sheet for Patients:   StrictlyIdeas.no Fact Sheet for Healthcare Providers: BankingDealers.co.za This test is not yet approved or cleared  by the [place] FDA and has been authorized for detection and/or diagnosis of SARS-CoV-2 by FDA under an Emergency Use Authorization (EUA).  This EUA will remain in effect (meaning this test can be used) for the duration of the COVID-19 declaration under Section 564(b)(1) of the Act, 21 U.S.C. section 360bbb-3(b)(1), unless the authorization is terminated or revoked sooner. Performed at [facility], [address] [place], [place] [address]     RADIOLOGY STUDIES/RESULTS: No results  found.   LOS: 58 days   Oren Binet, MD  [facility]    To contact the attending provider between 7A-7P or the covering provider during after hours 7P-7A, please log into the web site [email or web address] and access using universal [place] password for that web site. If you do not have the password, please call the hospital operator.  05/13/2020, 10:28 AM

## 2020-05-13 NOTE — TOC Progression Note (Signed)
Transition of Care [facility]) - Progression Note    Patient Details  Name: Kenneth Sullivan MRN: [id number] Date of Birth: [birth year]  [facility] CM/SW [place], [place] Phone Number: 05/13/2020, 5:42 PM  Clinical Narrative:    CSW attempted to follow-up with patient's niece to discuss discharge. CSW unable to leave a voicemail, will continue to follow-up.   Expected Discharge Plan: Skilled Nursing Facility Barriers to Discharge: No SNF bed  Expected Discharge Plan and Services Expected Discharge Plan: [place] In-house Referral: Clinical Social Work     Living arrangements for the past 2 months: Apartment                                       Social Determinants of Health (SDOH) Interventions    Readmission Risk Interventions No flowsheet data found.

## 2020-05-14 LAB — GLUCOSE, CAPILLARY: Glucose-Capillary: 98 mg/dL (ref 70–99)

## 2020-05-14 NOTE — Progress Notes (Signed)
PROGRESS NOTE        PATIENT DETAILS Name: Kenneth Sullivan Age: 64 y.o. Sex: male Date of Birth: 1956/02/08 Admit Date: 03/07/2020 Admitting Physician Marshell Garfinkel, MD [id number], [name]  Brief Narrative: 64 year old with no significant past medical history.  Had acute shortness of breath with sats in the 40s on EMS arrival.  He had a witnessed bradycardic, asystolic arrest when EMS was present with 3 rounds of epi, 20 minutes to ROSC. Underwent normothermia protocol.  Work-up significant for massive PE, DVT with RV strain-unfortunately for the hospital course complicated by upper GI bleeding due to gastric ulcer.  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  Significant events: 3/23>> Admit, EGD with findings of oozing gastric ulcer with clot, IR for mechanical thrombectomy, IVC filter and GDA embolization 4/5>>Trach 4/28>>trach decannulated  Significant studies: CTA 3/23 >> bilateral PE with RV/with ratio 1.9, emphysema CT head 3/23 >> chronic microvascular changes.  No acute findings Echo 3/23 >> RV is severely dilated with reduced function and D-shaped septum, LVEF 60-65%. Venogram 3/23>> occlusive DVT of the iliac vein extending to the lower IVC LE Venous Duplex 3/24 >> acute DVT in BLE up to the femoral vein  MRI Brain 3/28 >> multifocal acute to subacute ischemic infarcts involving bilateral cerebral hemispheres, findings consistent with global hypoperfusion event related to cardiac arrest, associated scattered petechial hemorrhage about many areas of ischemia, evidence of hemorrhagic conversion in the right parietal lobe  Antimicrobial therapy: Doxycycline: 4/9>> 4/17  Microbiology data: 3/23: Blood culture>> negative so far 4/10: Tracheal aspirate culture>> normal respiratory flora 4/10: Blood culture>> no growth 4/27: Urine culture>>  Proteus mirabilis 3/23:Sars COV2 3 >> negative  Surgical pathology: 5/19 >>liver biopsy: Neuroendocrine tumor 5/28>> gastric ulcer biopsy by EGD-pending  Procedures : 3/23-3/24>> LTM EEG: No seizures-moderate to severe diffuse encephalopathy 3/23>> spot EEG: No seizures, severe to profound diffuse encephalopathy 3/23>> placement of retrievable IVC filter, pulmonary angiogram and mechanical/aspiration thrombectomy 3/23 >>EGD >> large gastric ulcer 3/23 >>embolization of right gastric artery/gastroduodenal artery 3/23 by IR>> 3/23 >>4/5 ETT 4/5>>Trach >>changed to 6 cuffless 4/22 >> decannulated 4/28 5/19>> ultrasound-guided liver biopsy 5/28>> EGD with biopsy of gastric ulcer  Consults: CCM, IR, neurology, cardiology, oncology, [facility]  DVT Prophylaxis : SCD's  Subjective: A bit more agitated-wanting to get out of bed of bed  Assessment/Plan: Acute hypoxic respiratory failure in the setting of PE/cardiac arrest: Managed in the ICU-s/p tracheotomy-subsequently decannulated-currently stable on room air.  Cardiac arrest: Secondary to massive PE-continue telemetry monitoring.  Patient is not a long-term anticoagulation candidate given large gastric ulcer-secondary to neuroendocrine tumor-given risk of massive GI bleeding.  Massive PE with DVT- s/p IVC filter: Not anticoagulation candidate-given malignant gastric ulcer with high risk potential for bleeding.  Is s/p IVC filter placement and mechanical thrombectomy.  Anoxic encephalopathy: Pleasantly confused-able to answer simple questions.  Upper GI bleeding secondary to large malignant gastric ulcer with acute blood loss anemia: Underwent EGD x2-and subsequently embolization of GDA by IR.  No further bleeding apparent-on PPI.  Hemoglobin stable. EGD with biopsy on 5/28-biopsy pending  Neuroendocrine tumor with liver metastases: Difficult situation-with anoxic encephalopathy and unable to process/understand malignant  issue-will be very challenging to pursue aggressive therapy.  Long discussion with oncology-Dr. Benay Spice on 5/27 -we both  agree that it is probably best in this current condition to pursue palliative/hospice care. Family aware of our recommendation-family discussion in progress. No further inpatient work-up recommended by oncology-if patient is discharged to a facility here in town-patient will follow with oncology clinic at the cancer center-if discharged to a facility out of town-we will need local oncology follow-up.  Dysphagia: Secondary to anoxia-SLP following.  Delusions/hallucinations: Psych consulted-currently relatively stable-ICU delirium likely a component-continue antipsychotics.  Bedside sitter in place.  Nutrition Problem: Nutrition Problem: Severe Malnutrition Etiology: chronic illness(gastric lesion, multiple liver lesions, concern for metastatic disease) Signs/Symptoms: severe fat depletion, severe muscle depletion Interventions: Ensure Enlive (each supplement provides 350kcal and 20 grams of protein), Magic cup   Diet: Diet Order            Diet regular Room service appropriate? Yes; Fluid consistency: Thin  Diet effective now               Code Status:  DNR  Family Communication: Spoke with niece [name] [phone] 5/28, no answer on 5/29  Disposition Plan: Status is: Inpatient  Remains inpatient appropriate because:Inpatient level of care appropriate due to severity of illness  Dispo: The patient is from: Home              Anticipated d/c is to: SNF              Anticipated d/c date is: 1 day              Patient currently is medically stable to d/c.   Barriers to Discharge: Awaiting SNF bed.  Antimicrobial agents: Anti-infectives (From admission, onward)   Start     Dose/Rate Route Frequency Ordered Stop   03/26/20 1100  doxycycline (VIBRAMYCIN) 100 mg in sodium chloride 0.9 % 250 mL IVPB  Status:  Discontinued     100 mg 125 mL/hr over 120  Minutes Intravenous 2 times daily 03/26/20 1002 04/01/20 1035   03/25/20 1100  doxycycline (VIBRA-TABS) tablet 100 mg  Status:  Discontinued     100 mg Per Tube Every 12 hours 03/25/20 1009 03/26/20 1002   03/25/20 1015  doxycycline (VIBRAMYCIN) 100 mg in sodium chloride 0.9 % 250 mL IVPB  Status:  Discontinued    Note to Pharmacy: To be given after the tracheal aspirate is collected.   100 mg 125 mL/hr over 120 Minutes Intravenous Every 12 hours 03/25/20 1005 03/25/20 1008       Time spent: 15- minutes-Greater than 50% of this time was spent in counseling, explanation of diagnosis, planning of further management, and coordination of care.  MEDICATIONS: Scheduled Meds: . chlorhexidine gluconate (MEDLINE KIT)  15 mL Mouth Rinse BID  . feeding supplement (ENSURE ENLIVE)  237 mL Oral QID  . FLUoxetine  10 mg Oral QHS  . OXcarbazepine  75 mg Oral BID  . pantoprazole  40 mg Oral BID  . polyethylene glycol  17 g Oral Daily  . QUEtiapine  50 mg Oral BID   Continuous Infusions: . sodium chloride     PRN Meds:.sodium chloride, acetaminophen, docusate sodium, guaiFENesin-dextromethorphan, hydrALAZINE, ipratropium-albuterol, LORazepam, LORazepam **OR** [DISCONTINUED] LORazepam, oxyCODONE, sodium chloride flush   PHYSICAL EXAM: Vital signs: Vitals:   05/12/20 2100 05/13/20 0743 05/13/20 1626 05/14/20 0614  BP: 112/81 108/77 105/80 109/76  Pulse: 96 97 99 90  Resp:  _0 Temp: 97.6 F (36.4 C) 97.9 F (36.6 C)  97.7 F (36.5 C)  TempSrc: Oral   Oral  SpO2: 99% 96%  98% 99%  Weight:      Height:       Filed Weights   04/29/20 1557 04/30/20 0500 05/01/20 0648  Weight: 63 kg 63.9 kg 63 kg   Body mass index is 17.85 kg/m.   Gen Exam: Confused but not in any distress. HEENT:atraumatic, normocephalic Chest: B/L clear to auscultation anteriorly CVS:S1S2 regular Abdomen:soft non tender, non distended Extremities:no edema Neurology: Non focal Skin: no rash  I have  personally reviewed following labs and imaging studies  LABORATORY DATA: CBC: Recent Labs  Lab 05/10/20 0312 05/12/20 0310  WBC 6.9 6.9  NEUTROABS 4.6  --   HGB 9.8* 10.0*  HCT 30.6* 31.2*  MCV 91.1 90.4  PLT 470* 531*    Basic Metabolic Panel: Recent Labs  Lab 05/10/20 0312 05/12/20 0310  NA 138 140  K 3.9 3.9  CL 103 108  CO2 26 27  GLUCOSE 89 98  BUN 15 13  CREATININE 0.86 0.84  CALCIUM 8.5* 8.8*    GFR: Estimated Creatinine Clearance: 81.4 mL/min (by C-G formula based on SCr of 0.84 mg/dL).  Liver Function Tests: No results for input(s): AST, ALT, ALKPHOS, BILITOT, PROT, ALBUMIN in the last 168 hours. No results for input(s): LIPASE, AMYLASE in the last 168 hours. No results for input(s): AMMONIA in the last 168 hours.  Coagulation Profile: No results for input(s): INR, PROTIME in the last 168 hours.  Cardiac Enzymes: No results for input(s): CKTOTAL, CKMB, CKMBINDEX, TROPONINI in the last 168 hours.  BNP (last 3 results) No results for input(s): PROBNP in the last 8760 hours.  Lipid Profile: No results for input(s): CHOL, HDL, LDLCALC, TRIG, CHOLHDL, LDLDIRECT in the last 72 hours.  Thyroid Function Tests: No results for input(s): TSH, T4TOTAL, FREET4, T3FREE, THYROIDAB in the last 72 hours.  Anemia Panel: No results for input(s): VITAMINB12, FOLATE, FERRITIN, TIBC, IRON, RETICCTPCT in the last 72 hours.  Urine analysis:    Component Value Date/Time   COLORURINE AMBER (A) 04/11/2020 0153   APPEARANCEUR CLOUDY (A) 04/11/2020 0153   LABSPEC 1.024 04/11/2020 0153   PHURINE 7.0 04/11/2020 0153   GLUCOSEU NEGATIVE 04/11/2020 0153   HGBUR SMALL (A) 04/11/2020 0153   BILIRUBINUR NEGATIVE 04/11/2020 0153   KETONESUR NEGATIVE 04/11/2020 0153   PROTEINUR 100 (A) 04/11/2020 0153   NITRITE NEGATIVE 04/11/2020 0153   LEUKOCYTESUR LARGE (A) 04/11/2020 0153    Sepsis Labs: Lactic Acid, Venous    Component Value Date/Time   LATICACIDVEN 1.0 03/09/2020  0959    MICROBIOLOGY: Recent Results (from the past 240 hour(s))  SARS Coronavirus 2 by RT PCR (hospital order, performed in [place] hospital lab) Nasopharyngeal Nasopharyngeal Swab     Status: None   Collection Time: 05/12/20 12:01 PM   Specimen: Nasopharyngeal Swab  Result Value Ref Range Status   SARS Coronavirus 2 NEGATIVE NEGATIVE Final    Comment: (NOTE) SARS-CoV-2 target nucleic acids are NOT DETECTED. The SARS-CoV-2 RNA is generally detectable in upper and lower respiratory specimens during the acute phase of infection. The lowest concentration of SARS-CoV-2 viral copies this assay can detect is 250 copies / mL. A negative result does not preclude SARS-CoV-2 infection and should not be used as the sole basis for treatment or other patient management decisions.  A negative result may occur with improper specimen collection / handling, submission of specimen other than nasopharyngeal swab, presence of viral mutation(s) within the areas targeted by this assay, and inadequate number of viral copies (<250 copies / mL). A  negative result must be combined with clinical observations, patient history, and epidemiological information. Fact Sheet for Patients:   StrictlyIdeas.no Fact Sheet for Healthcare Providers: BankingDealers.co.za This test is not yet approved or cleared  by the [place] FDA and has been authorized for detection and/or diagnosis of SARS-CoV-2 by FDA under an Emergency Use Authorization (EUA).  This EUA will remain in effect (meaning this test can be used) for the duration of the COVID-19 declaration under Section 564(b)(1) of the Act, 21 U.S.C. section 360bbb-3(b)(1), unless the authorization is terminated or revoked sooner. Performed at [facility], [place] [address] [place], [place] [address]     RADIOLOGY STUDIES/RESULTS: No results found.   LOS: 58 days   Oren Binet, MD  [facility]    To contact the attending provider between 7A-7P or the covering provider during after hours 7P-7A, please log into the web site [email or web address] and access using universal [place] password for that web site. If you do not have the password, please call the hospital operator.  05/14/2020, 12:05 PM

## 2020-05-15 LAB — CBC
HCT: 33.3 % — ABNORMAL LOW (ref 39.0–52.0)
Hemoglobin: 10.7 g/dL — ABNORMAL LOW (ref 13.0–17.0)
MCH: 29 pg (ref 26.0–34.0)
MCHC: 32.1 g/dL (ref 30.0–36.0)
MCV: 90.2 fL (ref 80.0–100.0)
Platelets: 500 10*3/uL — ABNORMAL HIGH (ref 150–400)
RBC: 3.69 MIL/uL — ABNORMAL LOW (ref 4.22–5.81)
RDW: 16.9 % — ABNORMAL HIGH (ref 11.5–15.5)
WBC: 7.7 10*3/uL (ref 4.0–10.5)
nRBC: 0 % (ref 0.0–0.2)

## 2020-05-15 LAB — BASIC METABOLIC PANEL
Anion gap: 11 (ref 5–15)
BUN: 11 mg/dL (ref 8–23)
CO2: 23 mmol/L (ref 22–32)
Calcium: 8.6 mg/dL — ABNORMAL LOW (ref 8.9–10.3)
Chloride: 106 mmol/L (ref 98–111)
Creatinine, Ser: 0.84 mg/dL (ref 0.61–1.24)
GFR calc Af Amer: >60 mL/min (ref >60)
GFR calc non Af Amer: >60 mL/min (ref >60)
Glucose, Bld: 88 mg/dL (ref 70–99)
Potassium: 3.8 mmol/L (ref 3.5–5.1)
Sodium: 140 mmol/L (ref 135–145)

## 2020-05-15 MED ORDER — QUETIAPINE FUMARATE 50 MG PO TABS
75.0000 mg | ORAL_TABLET | Freq: Two times a day (BID) | ORAL | Status: DC
  Administered 2020-05-15 – 2020-05-19 (×8): 75 mg via ORAL
  Filled 2020-05-15 (×8): qty 1

## 2020-05-15 MED ORDER — LORAZEPAM 2 MG/ML IJ SOLN
1.0000 mg | INTRAMUSCULAR | Status: DC | PRN
  Administered 2020-05-15 – 2020-07-01 (×7): 1 mg via INTRAVENOUS
  Filled 2020-05-15 (×10): qty 1

## 2020-05-15 NOTE — Progress Notes (Signed)
Physical Therapy Treatment Patient Details Name: Kenneth Sullivan MRN: [id number] DOB: 1956/11/12 Today's Date: 05/15/2020    History of Present Illness Pt is 64 yo male with unknown medical hx.  He presented to ED on 3/23 with acute SHOB.  He had bradycardiac event that lead to asystole/cardiac arrest (ROSC achieved in 20 mins with 3 rounds epinephrine).  Pt was intubated and admitted to ICU.  Pt with massive PE/DVT and RV strain.  He underwent IR guided mechanical thrombectomy and IVC Filter.   Pt developed gastric ulcer and required GDA embolization.  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 Pt s/p trach on 4/5 and changed to cuffless on 4/22--decannualted 4/28    PT Comments    Pt initially agreeable to OOB mobility this day, pt highly distractible and commenting on little bugs he sees (not present). Pt required min assist +1-2 for bed mobility and stand at EOB, once standing pt declining further mobility and sat back to return to supine. PT educated pt on the importance of mobility and mobility progression, pt still reporting he wanted to rest for the rest of the day. PT to continue to follow acutely.    Follow Up Recommendations  SNF     Equipment Recommendations  Rolling walker with 5" wheels    Recommendations for Other Services       Precautions / Restrictions Precautions Precautions: Fall Precaution Comments: impulsive; can become quickly agitated Restrictions Weight Bearing Restrictions: No    Mobility  Bed Mobility Overal bed mobility: Needs Assistance Bed Mobility: Supine to Sit;Sit to Supine;Rolling Rolling: Min assist   Supine to sit: Min assist;HOB elevated Sit to supine: Min assist;HOB elevated   General bed mobility  comments: Min assist for rolling L and R post-transfer for bed pad adjustment, requires assist for LEs. Min assist for supine<>sit for trunk elevation and lowering, LE management. Increased time to perform. Max +2 assist for moving up in bed with use of boost with bed pads, pt unable to sequence task of scooting up.  Transfers Overall transfer level: Needs assistance Equipment used: 2 person hand held assist Transfers: Sit to/from Stand Sit to Stand: Min assist;+2 physical assistance;+2 safety/equipment;From elevated surface         General transfer comment: Min assist +2 for power up, steadying. PT and sitter attempted to ambulate with pt, pt refused once standing and initiated sit.  Ambulation/Gait             General Gait Details: unable to assess this day - pt declines   Stairs             Wheelchair Mobility    Modified Rankin (Stroke Patients Only) Modified Rankin (Stroke Patients Only) Pre-Morbid Rankin Score: No symptoms Modified Rankin: Moderately severe disability     Balance Overall balance assessment: Needs assistance Sitting-balance support: No upper extremity supported;Feet supported Sitting balance-Leahy Scale: Fair     Standing balance support: Single extremity supported Standing balance-Leahy Scale: Poor Standing balance comment: reliant on external support                            Cognition Arousal/Alertness: Awake/alert Behavior During Therapy: Restless;Impulsive Overall Cognitive Status: Impaired/Different from baseline Area of Impairment: Orientation;Attention;Following commands;Safety/judgement;Awareness;Problem solving;Memory  Orientation Level: Disoriented to;Time;Situation Current Attention Level: Focused Memory: Decreased short-term memory Following Commands: Follows one step commands inconsistently Safety/Judgement: Decreased awareness of deficits;Decreased awareness of safety Awareness:  Intellectual Problem Solving: Difficulty sequencing;Requires tactile cues;Requires verbal cues;Decreased initiation;Slow processing General Comments: pt perservating on grasshoppers in his shoes (not present), and states there are bugs on PT x3 during session (not present). Pt becomes upsets if you do not see the bugs. Pt requires increased time to follow commands, at times states "no" to performing task then begins initiating said task.      Exercises      General Comments        Pertinent Vitals/Pain Pain Assessment: No/denies pain Pain Intervention(s): Monitored during session;Limited activity within patient's tolerance    Home Living                      Prior Function            PT Goals (current goals can now be found in the care plan section) Acute Rehab PT Goals Patient Stated Goal: Pt unable PT Goal Formulation: Patient unable to participate in goal setting Time For Goal Achievement: 05/19/20 Potential to Achieve Goals: Fair Progress towards PT goals: Progressing toward goals    Frequency    Min 2X/week      PT Plan Current plan remains appropriate    Co-evaluation              AM-PAC PT "6 Clicks" Mobility   Outcome Measure  Help needed turning from your back to your side while in a flat bed without using bedrails?: A Little Help needed moving from lying on your back to sitting on the side of a flat bed without using bedrails?: A Little Help needed moving to and from a bed to a chair (including a wheelchair)?: A Lot Help needed standing up from a chair using your arms (e.g., wheelchair or bedside chair)?: A Lot Help needed to walk in hospital room?: A Little Help needed climbing 3-5 steps with a railing? : A Lot 6 Click Score: 15    End of Session Equipment Utilized During Treatment: Gait belt Activity Tolerance: Patient tolerated treatment well Patient left: with call bell/phone within reach;with nursing/sitter in room;with chair alarm  set;in chair Nurse Communication: Mobility status PT Visit Diagnosis: Unsteadiness on feet (R26.81);Other abnormalities of gait and mobility (R26.89)     Time: [id number] PT Time Calculation (min) (ACUTE ONLY): 13 min  Charges:  $Therapeutic Activity: 8-22 mins                    Sem Mccaughey E, PT Acute Rehabilitation Services Pager [phone]  Office [phone]   Latrish Mogel D Mahamud Metts 05/15/2020, 11:45 AM

## 2020-05-15 NOTE — TOC Progression Note (Signed)
Transition of Care [facility]) - Progression Note    Patient Details  Name: Kenneth Sullivan MRN: [id number] Date of Birth: 06-May-1956  [name] CM/SW Contact  [name], RN Phone Number: [phone] 05/15/2020, 12:59 PM  Clinical Narrative:     Spoke with patient's niece, [name], at [phone] to discuss transition planning. Mrs. [name] is unable to provide 24/7 care to patient. She states that she was the caregiver for her spouse for several years and is still dealing with the passing of her spouse. She also has an 57 year old at home, and a job. She states that patient has no one else willing to help, and she cannot care for him in her home by herself. Mrs. [name] would like to be able to visit patient on a regular basis, but would not be able to unless patient can be placed closer to her home. Mrs. [name] provided the name of a facility in [facility] [phone], and contact person in admissions - [name]. This NCM spoke with [name] who provided fax number [phone]. Clinical notes and demographics attempted to be faxed, but noted demographics fax kept failing. Spoke with representative at [facility] who verified fax number and stated that their fax was currently having problems. No one available in [facility] at this time to verify receipt of any faxes. TOC team to follow up in AM.    Expected Discharge Plan: [place] Barriers to Discharge: No SNF bed  Expected Discharge Plan and Services Expected Discharge Plan: [place] In-house Referral: Clinical Social Work     Living arrangements for the past 2 months: Apartment                                       Social Determinants of Health (SDOH) Interventions    Readmission Risk Interventions No flowsheet data found.

## 2020-05-15 NOTE — Progress Notes (Signed)
PROGRESS NOTE        PATIENT DETAILS Name: Kenneth Sullivan Age: 64 y.o. Sex: male Date of Birth: 01-07-1956 Admit Date: 03/07/2020 Admitting Physician Marshell Garfinkel, MD DDU:[id number], [name]  Brief Narrative: 64 year old with no significant past medical history.  Had acute shortness of breath with sats in the 40s on EMS arrival.  He had a witnessed bradycardic, asystolic arrest when EMS was present with 3 rounds of epi, 20 minutes to ROSC. Underwent normothermia protocol.  Work-up significant for massive PE, DVT with RV strain-unfortunately for the hospital course complicated by upper GI bleeding due to gastric ulcer.  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  Significant events: 3/23>> Admit, EGD with findings of oozing gastric ulcer with clot, IR for mechanical thrombectomy, IVC filter and GDA embolization 4/5>>Trach 4/28>>trach decannulated  Significant studies: CTA 3/23 >> bilateral PE with RV/with ratio 1.9, emphysema CT head 3/23 >> chronic microvascular changes.  No acute findings Echo 3/23 >> RV is severely dilated with reduced function and D-shaped septum, LVEF 60-65%. Venogram 3/23>> occlusive DVT of the iliac vein extending to the lower IVC LE Venous Duplex 3/24 >> acute DVT in BLE up to the femoral vein  MRI Brain 3/28 >> multifocal acute to subacute ischemic infarcts involving bilateral cerebral hemispheres, findings consistent with global hypoperfusion event related to cardiac arrest, associated scattered petechial hemorrhage about many areas of ischemia, evidence of hemorrhagic conversion in the right parietal lobe  Antimicrobial therapy: Doxycycline: 4/9>> 4/17  Microbiology data: 3/23: Blood culture>> negative so far 4/10: Tracheal aspirate culture>> normal respiratory flora 4/10: Blood culture>> no growth 4/27: Urine culture>>  Proteus mirabilis 3/23:Sars COV2 3 >> negative  Surgical pathology: 5/19 >>liver biopsy: Neuroendocrine tumor 5/28>> gastric ulcer biopsy by EGD-pending  Procedures : 3/23-3/24>> LTM EEG: No seizures-moderate to severe diffuse encephalopathy 3/23>> spot EEG: No seizures, severe to profound diffuse encephalopathy 3/23>> placement of retrievable IVC filter, pulmonary angiogram and mechanical/aspiration thrombectomy 3/23 >>EGD >> large gastric ulcer 3/23 >>embolization of right gastric artery/gastroduodenal artery 3/23 by IR>> 3/23 >>4/5 ETT 4/5>>Trach >>changed to 6 cuffless 4/22 >> decannulated 4/28 5/19>> ultrasound-guided liver biopsy 5/28>> EGD with biopsy of gastric ulcer  Consults: CCM, IR, neurology, cardiology, oncology, [facility]  DVT Prophylaxis : SCD's  Subjective: Wanting to leave the hospital  Assessment/Plan: Acute hypoxic respiratory failure in the setting of PE/cardiac arrest: Managed in the ICU-s/p tracheotomy-subsequently decannulated-currently stable on room air.  Cardiac arrest: Secondary to massive PE-continue telemetry monitoring.  Patient is not a long-term anticoagulation candidate given large gastric ulcer-secondary to neuroendocrine tumor-given risk of massive GI bleeding.  Massive PE with DVT- s/p IVC filter: Not anticoagulation candidate-given malignant gastric ulcer with high risk potential for bleeding.  Is s/p IVC filter placement and mechanical thrombectomy.  Anoxic encephalopathy: Pleasantly confused-able to answer simple questions.  Upper GI bleeding secondary to large malignant gastric ulcer with acute blood loss anemia: Underwent EGD x2-and subsequently embolization of GDA by IR.  No further bleeding apparent-on PPI.  Hemoglobin stable. EGD with biopsy on 5/28-biopsy pending  Neuroendocrine tumor with liver metastases: Difficult situation-with anoxic encephalopathy and unable to process/understand malignant issue-will be very  challenging to pursue aggressive therapy.  Long discussion with oncology-Dr. Benay Spice on 5/27 -we both agree that it is probably best  in this current condition to pursue palliative/hospice care. Family aware of our recommendation-family discussion in progress. No further inpatient work-up recommended by oncology-if patient is discharged to a facility here in town-patient will follow with oncology clinic at the cancer center-if discharged to a facility out of town-we will need local oncology follow-up.  Dysphagia: Secondary to anoxia-SLP following.  Delusions/hallucinations: Psych consulted-currently relatively stable-ICU delirium likely a component-continue antipsychotics.  Bedside sitter in place.  Nutrition Problem: Nutrition Problem: Severe Malnutrition Etiology: chronic illness(gastric lesion, multiple liver lesions, concern for metastatic disease) Signs/Symptoms: severe fat depletion, severe muscle depletion Interventions: Ensure Enlive (each supplement provides 350kcal and 20 grams of protein), Magic cup   Diet: Diet Order            Diet regular Room service appropriate? Yes; Fluid consistency: Thin  Diet effective now               Code Status:  DNR  Family Communication: Spoke with niece [name] [id number] 5/31  Disposition Plan: Status is: Inpatient  Remains inpatient appropriate because:Inpatient level of care appropriate due to severity of illness  Dispo: The patient is from: Home              Anticipated d/c is to: SNF              Anticipated d/c date is: 1 day              Patient currently is medically stable to d/c.   Barriers to Discharge: Awaiting SNF bed.  Antimicrobial agents: Anti-infectives (From admission, onward)   Start     Dose/Rate Route Frequency Ordered Stop   03/26/20 1100  doxycycline (VIBRAMYCIN) 100 mg in sodium chloride 0.9 % 250 mL IVPB  Status:  Discontinued     100 mg 125 mL/hr over 120 Minutes Intravenous 2 times daily  03/26/20 1002 04/01/20 1035   03/25/20 1100  doxycycline (VIBRA-TABS) tablet 100 mg  Status:  Discontinued     100 mg Per Tube Every 12 hours 03/25/20 1009 03/26/20 1002   03/25/20 1015  doxycycline (VIBRAMYCIN) 100 mg in sodium chloride 0.9 % 250 mL IVPB  Status:  Discontinued    Note to Pharmacy: To be given after the tracheal aspirate is collected.   100 mg 125 mL/hr over 120 Minutes Intravenous Every 12 hours 03/25/20 1005 03/25/20 1008       Time spent: 15- minutes-Greater than 50% of this time was spent in counseling, explanation of diagnosis, planning of further management, and coordination of care.  MEDICATIONS: Scheduled Meds: . chlorhexidine gluconate (MEDLINE KIT)  15 mL Mouth Rinse BID  . feeding supplement (ENSURE ENLIVE)  237 mL Oral QID  . FLUoxetine  10 mg Oral QHS  . OXcarbazepine  75 mg Oral BID  . pantoprazole  40 mg Oral BID  . polyethylene glycol  17 g Oral Daily  . QUEtiapine  50 mg Oral BID   Continuous Infusions: . sodium chloride     PRN Meds:.sodium chloride, acetaminophen, docusate sodium, guaiFENesin-dextromethorphan, hydrALAZINE, ipratropium-albuterol, LORazepam, LORazepam **OR** [DISCONTINUED] LORazepam, oxyCODONE, sodium chloride flush   PHYSICAL EXAM: Vital signs: Vitals:   05/14/20 0614 05/14/20 1545 05/14/20 2246 05/15/20 0827  BP: 109/76 (!) 125/91 113/77 109/86  Pulse: 90 (!) 104 96 92  Resp: 18  17   Temp: 97.7 F (36.5 C) 98.2 F (36.8 C) 97.8 F (36.6 C) 98.8 F (37.1 C)  TempSrc: Oral  Oral   SpO2: 99% 97% 96% 97%  Weight:  Height:       Filed Weights   04/29/20 1557 04/30/20 0500 05/01/20 0648  Weight: 63 kg 63.9 kg 63 kg   Body mass index is 17.85 kg/m.   Gen Exam: Confused but not in any distress. HEENT:atraumatic, normocephalic Chest: B/L clear to auscultation anteriorly CVS:S1S2 regular Abdomen:soft non tender, non distended Extremities:no edema Neurology: Non focal Skin: no rash  I have personally  reviewed following labs and imaging studies  LABORATORY DATA: CBC: Recent Labs  Lab 05/10/20 0312 05/12/20 0310 05/15/20 0352  WBC 6.9 6.9 7.7  NEUTROABS 4.6  --   --   HGB 9.8* 10.0* 10.7*  HCT 30.6* 31.2* 33.3*  MCV 91.1 90.4 90.2  PLT 470* 531* 500*    Basic Metabolic Panel: Recent Labs  Lab 05/10/20 0312 05/12/20 0310 05/15/20 0352  NA 138 140 140  K 3.9 3.9 3.8  CL 103 108 106  CO2 _0 GLUCOSE 89 98 88  BUN _1 CREATININE 0.86 0.84 0.84  CALCIUM 8.5* 8.8* 8.6*    GFR: Estimated Creatinine Clearance: 81.4 mL/min (by C-G formula based on SCr of 0.84 mg/dL).  Liver Function Tests: No results for input(s): AST, ALT, ALKPHOS, BILITOT, PROT, ALBUMIN in the last 168 hours. No results for input(s): LIPASE, AMYLASE in the last 168 hours. No results for input(s): AMMONIA in the last 168 hours.  Coagulation Profile: No results for input(s): INR, PROTIME in the last 168 hours.  Cardiac Enzymes: No results for input(s): CKTOTAL, CKMB, CKMBINDEX, TROPONINI in the last 168 hours.  BNP (last 3 results) No results for input(s): PROBNP in the last 8760 hours.  Lipid Profile: No results for input(s): CHOL, HDL, LDLCALC, TRIG, CHOLHDL, LDLDIRECT in the last 72 hours.  Thyroid Function Tests: No results for input(s): TSH, T4TOTAL, FREET4, T3FREE, THYROIDAB in the last 72 hours.  Anemia Panel: No results for input(s): VITAMINB12, FOLATE, FERRITIN, TIBC, IRON, RETICCTPCT in the last 72 hours.  Urine analysis:    Component Value Date/Time   COLORURINE AMBER (A) 04/11/2020 0153   APPEARANCEUR CLOUDY (A) 04/11/2020 0153   LABSPEC 1.024 04/11/2020 0153   PHURINE 7.0 04/11/2020 0153   GLUCOSEU NEGATIVE 04/11/2020 0153   HGBUR SMALL (A) 04/11/2020 0153   BILIRUBINUR NEGATIVE 04/11/2020 0153   KETONESUR NEGATIVE 04/11/2020 0153   PROTEINUR 100 (A) 04/11/2020 0153   NITRITE NEGATIVE 04/11/2020 0153   LEUKOCYTESUR LARGE (A) 04/11/2020 0153    Sepsis  Labs: Lactic Acid, Venous    Component Value Date/Time   LATICACIDVEN 1.0 03/09/2020 0959    MICROBIOLOGY: Recent Results (from the past 240 hour(s))  SARS Coronavirus 2 by RT PCR (hospital order, performed in [place] hospital lab) Nasopharyngeal Nasopharyngeal Swab     Status: None   Collection Time: 05/12/20 12:01 PM   Specimen: Nasopharyngeal Swab  Result Value Ref Range Status   SARS Coronavirus 2 NEGATIVE NEGATIVE Final    Comment: (NOTE) SARS-CoV-2 target nucleic acids are NOT DETECTED. The SARS-CoV-2 RNA is generally detectable in upper and lower respiratory specimens during the acute phase of infection. The lowest concentration of SARS-CoV-2 viral copies this assay can detect is 250 copies / mL. A negative result does not preclude SARS-CoV-2 infection and should not be used as the sole basis for treatment or other patient management decisions.  A negative result may occur with improper specimen collection / handling, submission of specimen other than nasopharyngeal swab, presence of viral mutation(s) within the areas targeted by this assay,  and inadequate number of viral copies (<250 copies / mL). A negative result must be combined with clinical observations, patient history, and epidemiological information. Fact Sheet for Patients:   StrictlyIdeas.no Fact Sheet for Healthcare Providers: BankingDealers.co.za This test is not yet approved or cleared  by the [place] FDA and has been authorized for detection and/or diagnosis of SARS-CoV-2 by FDA under an Emergency Use Authorization (EUA).  This EUA will remain in effect (meaning this test can be used) for the duration of the COVID-19 declaration under Section 564(b)(1) of the Act, 21 U.S.C. section 360bbb-3(b)(1), unless the authorization is terminated or revoked sooner. Performed at [facility], [place] [address] [place], [place] [address]     RADIOLOGY  STUDIES/RESULTS: No results found.   LOS: 73 days   Oren Binet, MD  [facility]    To contact the attending provider between 7A-7P or the covering provider during after hours 7P-7A, please log into the web site [email or web address] and access using universal [place] password for that web site. If you do not have the password, please call the hospital operator.  05/15/2020, 11:00 AM

## 2020-05-15 NOTE — Plan of Care (Signed)
°  Problem: Nutrition: °Goal: Adequate nutrition will be maintained °Outcome: Progressing °  °Problem: Coping: °Goal: Level of anxiety will decrease °Outcome: Progressing °  °Problem: Safety: °Goal: Ability to remain free from injury will improve °Outcome: Progressing °  °

## 2020-05-15 NOTE — Plan of Care (Signed)
  Problem: Role Relationship: Goal: Method of communication will improve Outcome: Progressing   Problem: Education: Goal: Knowledge of General Education information will improve Description: Including pain rating scale, medication(s)/side effects and non-pharmacologic comfort measures Outcome: Progressing   Problem: Health Behavior/Discharge Planning: Goal: Ability to manage health-related needs will improve Outcome: Progressing   Problem: Nutrition: Goal: Adequate nutrition will be maintained Outcome: Progressing   Problem: Coping: Goal: Level of anxiety will decrease Outcome: Progressing   Problem: Safety: Goal: Ability to remain free from injury will improve Outcome: Progressing   Problem: Education: Goal: Knowledge of disease or condition will improve Outcome: Progressing Goal: Knowledge of secondary prevention will improve Outcome: Progressing Goal: Knowledge of patient specific risk factors addressed and post discharge goals established will improve Outcome: Progressing Goal: Individualized Educational Video(s) Outcome: Progressing   Problem: Coping: Goal: Will verbalize positive feelings about self Outcome: Progressing Goal: Will identify appropriate support needs Outcome: Progressing   Problem: Health Behavior/Discharge Planning: Goal: Ability to manage health-related needs will improve Outcome: Progressing   Problem: Self-Care: Goal: Ability to participate in self-care as condition permits will improve Outcome: Progressing Goal: Verbalization of feelings and concerns over difficulty with self-care will improve Outcome: Progressing Goal: Ability to communicate needs accurately will improve Outcome: Progressing   Problem: Nutrition: Goal: Risk of aspiration will decrease Outcome: Progressing Goal: Dietary intake will improve Outcome: Progressing   Problem: Intracerebral Hemorrhage Tissue Perfusion: Goal: Complications of Intracerebral Hemorrhage will  be minimized Outcome: Progressing   Problem: Ischemic Stroke/TIA Tissue Perfusion: Goal: Complications of ischemic stroke/TIA will be minimized Outcome: Progressing   Problem: Spontaneous Subarachnoid Hemorrhage Tissue Perfusion: Goal: Complications of Spontaneous Subarachnoid Hemorrhage will be minimized Outcome: Progressing   

## 2020-05-16 LAB — GLUCOSE, CAPILLARY: Glucose-Capillary: 129 mg/dL — ABNORMAL HIGH (ref 70–99)

## 2020-05-16 NOTE — Progress Notes (Addendum)
HEMATOLOGY-ONCOLOGY PROGRESS NOTE  SUBJECTIVE: Resting quietly this afternoon.  Offers no specific complaints.  PHYSICAL EXAMINATION:  Vitals:   05/16/20 0826 05/16/20 0921  BP: 108/86 101/74  Pulse: 69 95  Resp: 19 18  Temp: (!) 97.4 F (36.3 C) 98.3 F (36.8 C)  SpO2: 100% 98%   Filed Weights   04/29/20 1557 04/30/20 0500 05/01/20 0648  Weight: 63 kg 63.9 kg 63 kg    Intake/Output from previous day: 05/31 0701 - 06/01 0700 In: 727 [P.O.:717; I.V.:10] Out: 250 [Urine:250]  GENERAL:alert, no distress and comfortable ABDOMEN:abdomen soft, non-tender and normal bowel sounds  NEURO: Alert, confused at times and answered inappropriately at times  LABORATORY DATA:  I have reviewed the data as listed CMP Latest Ref Rng & Units 05/15/2020 05/12/2020 05/10/2020  Glucose 70 - 99 mg/dL 88 98 89  BUN 8 - 23 mg/dL _0 Creatinine 0.61 - 1.24 mg/dL 0.84 0.84 0.86  Sodium 135 - 145 mmol/L 140 140 138  Potassium 3.5 - 5.1 mmol/L 3.8 3.9 3.9  Chloride 98 - 111 mmol/L 106 108 103  CO2 22 - 32 mmol/L _1 Calcium 8.9 - 10.3 mg/dL 8.6(L) 8.8(L) 8.5(L)  Total Protein 6.5 - 8.1 g/dL - - -  Total Bilirubin 0.3 - 1.2 mg/dL - - -  Alkaline Phos 38 - 126 U/L - - -  AST 15 - 41 U/L - - -  ALT 0 - 44 U/L - - -    Lab Results  Component Value Date   WBC 7.7 05/15/2020   HGB 10.7 (L) 05/15/2020   HCT 33.3 (L) 05/15/2020   MCV 90.2 05/15/2020   PLT 500 (H) 05/15/2020   NEUTROABS 4.6 05/10/2020    CT ABDOMEN PELVIS W CONTRAST  Result Date: 05/01/2020 CLINICAL DATA:  Acute abdominal pain EXAM: CT ABDOMEN AND PELVIS WITH CONTRAST TECHNIQUE: Multidetector CT imaging of the abdomen and pelvis was performed using the standard protocol following bolus administration of intravenous contrast. CONTRAST:  166m OMNIPAQUE IOHEXOL 300 MG/ML  SOLN COMPARISON:  03/23/2020 FINDINGS: Lower chest: Trace dependent right pleural effusion. Lung bases otherwise clear. Normal heart size. No pericardial  effusion. No hiatal hernia. Hepatobiliary: New hypodense hepatic lesions bilaterally. Left hepatic dome lesion measures 5.4 cm. Peripheral right hepatic lesion measures 2.4 cm. Anterior right hepatic dome lesion measures 1 cm. These are all measured on image 12 series 3. Lesions are compatible with new hepatic metastases. Focal fat noted along the falciform ligament anteriorly as before. No intrahepatic biliary dilatation. Hepatic and portal veins are patent. Gallbladder collapsed. Pancreas: Unremarkable. No pancreatic ductal dilatation or surrounding inflammatory changes. Spleen: Overall small in size. No focal abnormality. No parenchymal lesion. Adrenals/Urinary Tract: Normal adrenal glands. No renal obstruction or hydronephrosis. Posterior left upper pole renal cyst measures 8 cm. No hydroureter or urinary tract calculus. Bladder under distended. Stomach/Bowel: Diffuse hypodense transmural wall thickening of the stomach antrum, image 24 series 3 and image 10 series 8 suspicious for a gastric antral mass, predominant along the posterior wall roughly measuring 7.1 x 3.3 cm, image 9 series 8. Proximal stomach is distended. Difficult to exclude some component of partial gastric outlet obstruction. There is air and contrast within the small intestine more distally. No small bowel obstruction pattern. No free air. No significant free fluid, ascites, hemorrhage, hematoma or abscess. Vascular/Lymphatic: Infrarenal and aortic bifurcation atherosclerosis without aneurysm or dissection. No occlusive process. Mesenteric and renal vasculature appear patent. IVC filter noted. No bulky adenopathy. Reproductive:  No significant finding by CT. Other: No inguinal or abdominal wall hernia. Musculoskeletal: Degenerative changes of the spine and lower lumbar facet arthropathy. 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 Consider GI consultation and repeat  endoscopy. Aortic Atherosclerosis (ICD10-I70.0). These results will be called to the ordering clinician or representative by the Radiologist Assistant, and communication documented in the PACS or [employer]. Electronically Signed   By: Jerilynn Mages.  Shick M.D.   On: 05/01/2020 16:02   US BIOPSY (LIVER)  Result Date: 05/03/2020 INDICATION: 64 year old male with multiple liver lesions and concern for metastatic disease EXAM: ULTRASOUND-GUIDED BIOPSY LEFT LIVER MASS MEDICATIONS: None. ANESTHESIA/SEDATION: Moderate (conscious) sedation was employed during this procedure. A total of Versed 2.0 mg and Fentanyl 100 mcg was administered intravenously. Moderate Sedation Time: 10 minutes. The patient's level of consciousness and vital signs were monitored continuously by radiology nursing throughout the procedure under my direct supervision. FLUOROSCOPY TIME:  None). COMPLICATIONS: None PROCEDURE: Informed written consent was obtained from the patient after a thorough discussion of the procedural risks, benefits and alternatives. All questions were addressed. Maximal Sterile Barrier Technique was utilized including caps, mask, sterile gowns, sterile gloves, sterile drape, hand hygiene and skin antiseptic. A timeout was performed prior to the initiation of the procedure Ultrasound survey of the bilateral liver lobe performed with images stored and sent to PACs. The subxiphoid region of the abdomen was prepped with chlorhexidine in a sterile fashion, and a sterile drape was applied covering the operative field. A sterile gown and sterile gloves were used for the procedure. Local anesthesia was provided with 1% Lidocaine. Once the patient is prepped and draped sterilely and the skin and subcutaneous tissues were generously infiltrated with 1% lidocaine. A 17 gauge introducer needle was advanced into the left liver lobe targeting heterogeneously echoic mass of the left liver. The stylet was removed, and multiple 18 gauge core  biopsy were retrieved. Samples were placed into formalin for transportation to the lab. Gel-Foam pledgets were then infused with a small amount of saline for assistance with hemostasis. The needle was removed, and a final ultrasound image was performed. The patient tolerated the procedure well and remained hemodynamically stable throughout. No complications were encountered and no significant blood loss was encounter. IMPRESSION: Status post ultrasound-guided biopsy of left liver mass. Signed, Dulcy Fanny. Dellia Nims, RPVI [facility] Electronically Signed   By: Corrie Mckusick D.O.   On: 05/03/2020 16:16   DG Abd 2 Views  Result Date: 04/28/2020 CLINICAL DATA:  64 year old male with abdominal pain. History of GI bleeding treated with mesenteric embolization. EXAM: ABDOMEN - 2 VIEW COMPARISON:  Noncontrast CT Abdomen and Pelvis 03/23/2020. Abdominal radiographs 04/11/2020 and earlier. FINDINGS: Three supine views of the abdomen and pelvis. Negative lung bases. Enteric feeding tube removed from last month. Stable IVC filter and epigastric embolization coils. Non obstructed bowel gas pattern. Decreased retained stool in the large bowel from last month. No acute osseous abnormality identified. IMPRESSION: No acute findings. Non obstructed bowel gas pattern with decreased retained stool in the colon from last month. Electronically Signed   By: Genevie Ann M.D.   On: 04/28/2020 12:46    ASSESSMENT AND PLAN: 1.    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  PE/DVT status post mechanical  thrombectomy and IVC filter placement on 03/07/2020 3.  Tracheostomy placement on 03/20/2020, now healed 4.  Cardiac arrest on 03/07/2020 5.  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  History of acute blood loss anemia likely due to upper GI bleed 7.  Delusions/hallucinations  Mr. Tabar appears stable.  Biopsy of one of his liver lesions showed a well differentiated neuroendocrine tumor with high proliferation.  He underwent EGD on 05/12/2020 and biopsy results is currently pending.  The diagnosis has been discussed with the patient who lacks insight into this diagnosis and treatment options.  Diagnosis and treatment options discussed last week with the patient's niece, [name].  The patient is awaiting SNF placement and family would like him closer to their home near [place], [place].  Recommendations: 1.  We will await biopsy results from EGD. 2.  Awaiting final disposition plan.  We will set him up for outpatient follow-up.  Reform cancer center if he remains in [place].  Otherwise, we can refer him for outpatient oncology follow-up closer to where he is placed.   LOS: 70 days   Mikey Bussing, [place], AGPCNP-BC, AOCNP 05/16/20 Mr. Raden was interviewed and examined.  He appears unchanged.  He did not have abdominal tenderness or a palpable mass on exam today.  We are waiting on results from the endoscopic biopsy 05/12/2020. He continues to await skilled nursing facility placement.  We will arrange for outpatient oncology follow-up here if he is placed near [facility].  We will recommend oncology follow-up elsewhere if he is able to be placed closer to his family.  I will be out until 05/22/2020.  Please call oncology as needed.

## 2020-05-16 NOTE — Progress Notes (Signed)
PROGRESS NOTE        PATIENT DETAILS Name: Kenneth Sullivan Age: 64 y.o. Sex: male Date of Birth: 1956/04/08 Admit Date: 03/07/2020 Admitting Physician Marshell Garfinkel, MD MOQ:[id number], [name]  Brief Narrative: 64 year old with no significant past medical history.  Had acute shortness of breath with sats in the 40s on EMS arrival.  He had a witnessed bradycardic, asystolic arrest when EMS was present with 3 rounds of epi, 20 minutes to ROSC. Underwent normothermia protocol.  Work-up significant for massive PE, DVT with RV strain-unfortunately for the hospital course complicated by upper GI bleeding due to gastric ulcer.  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  Significant events: 3/23>> Admit, EGD with findings of oozing gastric ulcer with clot, IR for mechanical thrombectomy, IVC filter and GDA embolization 4/5>>Trach 4/28>>trach decannulated  Significant studies: CTA 3/23 >> bilateral PE with RV/with ratio 1.9, emphysema CT head 3/23 >> chronic microvascular changes.  No acute findings Echo 3/23 >> RV is severely dilated with reduced function and D-shaped septum, LVEF 60-65%. Venogram 3/23>> occlusive DVT of the iliac vein extending to the lower IVC LE Venous Duplex 3/24 >> acute DVT in BLE up to the femoral vein  MRI Brain 3/28 >> multifocal acute to subacute ischemic infarcts involving bilateral cerebral hemispheres, findings consistent with global hypoperfusion event related to cardiac arrest, associated scattered petechial hemorrhage about many areas of ischemia, evidence of hemorrhagic conversion in the right parietal lobe  Antimicrobial therapy: Doxycycline: 4/9>> 4/17  Microbiology data: 3/23: Blood culture>> negative so far 4/10: Tracheal aspirate culture>> normal respiratory flora 4/10: Blood culture>> no growth 4/27: Urine culture>>  Proteus mirabilis 3/23:Sars COV2 3 >> negative  Surgical pathology: 5/19 >>liver biopsy: Neuroendocrine tumor 5/28>> gastric ulcer biopsy by EGD-pending  Procedures : 3/23-3/24>> LTM EEG: No seizures-moderate to severe diffuse encephalopathy 3/23>> spot EEG: No seizures, severe to profound diffuse encephalopathy 3/23>> placement of retrievable IVC filter, pulmonary angiogram and mechanical/aspiration thrombectomy 3/23 >>EGD >> large gastric ulcer 3/23 >>embolization of right gastric artery/gastroduodenal artery 3/23 by IR>> 3/23 >>4/5 ETT 4/5>>Trach >>changed to 6 cuffless 4/22 >> decannulated 4/28 5/19>> ultrasound-guided liver biopsy 5/28>> EGD with biopsy of gastric ulcer  Consults: CCM, IR, neurology, cardiology, oncology, [facility]  DVT Prophylaxis : SCD's  Subjective: Confused-but sleeping this morning.  Agitated the entire day yesterday-somewhat more calmer today-bedside sitter in place.  Assessment/Plan: Acute hypoxic respiratory failure in the setting of PE/cardiac arrest: Managed in the ICU-s/p tracheotomy-subsequently decannulated-currently stable on room air.  Cardiac arrest: Secondary to massive PE-continue telemetry monitoring.  Patient is not a long-term anticoagulation candidate given large gastric ulcer-secondary to neuroendocrine tumor-given risk of massive GI bleeding.  Massive PE with DVT- s/p IVC filter: Not anticoagulation candidate-given malignant gastric ulcer with high risk potential for bleeding.  Is s/p IVC filter placement and mechanical thrombectomy.  Anoxic encephalopathy: Pleasantly confused-able to answer simple questions.  Upper GI bleeding secondary to large malignant gastric ulcer with acute blood loss anemia: Underwent EGD x2-and subsequently embolization of GDA by IR.  No further bleeding apparent-on PPI.  Hemoglobin stable. EGD with biopsy on 5/28-biopsy pending  Neuroendocrine tumor with liver metastases: Difficult  situation-with anoxic encephalopathy and unable to process/understand malignant issue-will be very challenging to pursue aggressive therapy.  Long discussion with oncology-Dr.  Sherrill on 5/27 -we both agree that it is probably best in this current condition to pursue palliative/hospice care. Family aware of our recommendation-family discussion in progress. No further inpatient work-up recommended by oncology-if patient is discharged to a facility here in town-patient will follow with oncology clinic at the cancer center-if discharged to a facility out of town-we will need local oncology follow-up.  Dysphagia: Secondary to anoxia-SLP following.  Delusions/hallucinations: Psych consulted-currently relatively stable-ICU delirium likely a component-continue antipsychotics.  Bedside sitter in place.  I have consulted psychiatry for assistance and medication management.  Nutrition Problem: Nutrition Problem: Severe Malnutrition Etiology: chronic illness(gastric lesion, multiple liver lesions, concern for metastatic disease) Signs/Symptoms: severe fat depletion, severe muscle depletion Interventions: Ensure Enlive (each supplement provides 350kcal and 20 grams of protein), Magic cup   Diet: Diet Order            Diet regular Room service appropriate? Yes; Fluid consistency: Thin  Diet effective now               Code Status:  DNR  Family Communication: Spoke with niece [name] [phone] 5/31-we will update over the next few days.  Disposition Plan: Status is: Inpatient  Remains inpatient appropriate because:Inpatient level of care appropriate due to severity of illness  Dispo: The patient is from: Home              Anticipated d/c is to: SNF              Anticipated d/c date is: 1 day              Patient currently is not medically stable to d/c.   Barriers to Discharge: Need optimization of psychiatric medications before discharge.  Patient has been also  pending.  Antimicrobial agents: Anti-infectives (From admission, onward)   Start     Dose/Rate Route Frequency Ordered Stop   03/26/20 1100  doxycycline (VIBRAMYCIN) 100 mg in sodium chloride 0.9 % 250 mL IVPB  Status:  Discontinued     100 mg 125 mL/hr over 120 Minutes Intravenous 2 times daily 03/26/20 1002 04/01/20 1035   03/25/20 1100  doxycycline (VIBRA-TABS) tablet 100 mg  Status:  Discontinued     100 mg Per Tube Every 12 hours 03/25/20 1009 03/26/20 1002   03/25/20 1015  doxycycline (VIBRAMYCIN) 100 mg in sodium chloride 0.9 % 250 mL IVPB  Status:  Discontinued    Note to Pharmacy: To be given after the tracheal aspirate is collected.   100 mg 125 mL/hr over 120 Minutes Intravenous Every 12 hours 03/25/20 1005 03/25/20 1008       Time spent: 15- minutes-Greater than 50% of this time was spent in counseling, explanation of diagnosis, planning of further management, and coordination of care.  MEDICATIONS: Scheduled Meds: . chlorhexidine gluconate (MEDLINE KIT)  15 mL Mouth Rinse BID  . feeding supplement (ENSURE ENLIVE)  237 mL Oral QID  . FLUoxetine  10 mg Oral QHS  . OXcarbazepine  75 mg Oral BID  . pantoprazole  40 mg Oral BID  . polyethylene glycol  17 g Oral Daily  . QUEtiapine  75 mg Oral BID   Continuous Infusions: . sodium chloride     PRN Meds:.sodium chloride, acetaminophen, docusate sodium, guaiFENesin-dextromethorphan, hydrALAZINE, ipratropium-albuterol, LORazepam, LORazepam **OR** [DISCONTINUED] LORazepam, oxyCODONE, sodium chloride flush   PHYSICAL EXAM: Vital signs: Vitals:   05/15/20 1605 05/15/20 2153 05/16/20 0826 05/16/20 0921  BP: 100/70 101/71 108/86 101/74  Pulse: 91 86 69 95  Resp:  _0 Temp: 98.5 F (36.9 C) 97.6 F (36.4 C) (!) 97.4 F (36.3 C) 98.3 F (36.8 C)  TempSrc:  Oral Oral Oral  SpO2: 98% 100% 100% 98%  Weight:      Height:       Filed Weights   04/29/20 1557 04/30/20 0500 05/01/20 0648  Weight: 63 kg 63.9 kg 63 kg    Body mass index is 17.85 kg/m.   Gen Exam: Confused but not in any distress. HEENT:atraumatic, normocephalic Chest: B/L clear to auscultation anteriorly CVS:S1S2 regular Abdomen:soft non tender, non distended Extremities:no edema Neurology: Non focal Skin: no rash  I have personally reviewed following labs and imaging studies  LABORATORY DATA: CBC: Recent Labs  Lab 05/10/20 0312 05/12/20 0310 05/15/20 0352  WBC 6.9 6.9 7.7  NEUTROABS 4.6  --   --   HGB 9.8* 10.0* 10.7*  HCT 30.6* 31.2* 33.3*  MCV 91.1 90.4 90.2  PLT 470* 531* 500*    Basic Metabolic Panel: Recent Labs  Lab 05/10/20 0312 05/12/20 0310 05/15/20 0352  NA 138 140 140  K 3.9 3.9 3.8  CL 103 108 106  CO2 _1 GLUCOSE 89 98 88  BUN _2 CREATININE 0.86 0.84 0.84  CALCIUM 8.5* 8.8* 8.6*    GFR: Estimated Creatinine Clearance: 81.4 mL/min (by C-G formula based on SCr of 0.84 mg/dL).  Liver Function Tests: No results for input(s): AST, ALT, ALKPHOS, BILITOT, PROT, ALBUMIN in the last 168 hours. No results for input(s): LIPASE, AMYLASE in the last 168 hours. No results for input(s): AMMONIA in the last 168 hours.  Coagulation Profile: No results for input(s): INR, PROTIME in the last 168 hours.  Cardiac Enzymes: No results for input(s): CKTOTAL, CKMB, CKMBINDEX, TROPONINI in the last 168 hours.  BNP (last 3 results) No results for input(s): PROBNP in the last 8760 hours.  Lipid Profile: No results for input(s): CHOL, HDL, LDLCALC, TRIG, CHOLHDL, LDLDIRECT in the last 72 hours.  Thyroid Function Tests: No results for input(s): TSH, T4TOTAL, FREET4, T3FREE, THYROIDAB in the last 72 hours.  Anemia Panel: No results for input(s): VITAMINB12, FOLATE, FERRITIN, TIBC, IRON, RETICCTPCT in the last 72 hours.  Urine analysis:    Component Value Date/Time   COLORURINE AMBER (A) 04/11/2020 0153   APPEARANCEUR CLOUDY (A) 04/11/2020 0153   LABSPEC 1.024 04/11/2020 0153   PHURINE 7.0  04/11/2020 0153   GLUCOSEU NEGATIVE 04/11/2020 0153   HGBUR SMALL (A) 04/11/2020 0153   BILIRUBINUR NEGATIVE 04/11/2020 0153   KETONESUR NEGATIVE 04/11/2020 0153   PROTEINUR 100 (A) 04/11/2020 0153   NITRITE NEGATIVE 04/11/2020 0153   LEUKOCYTESUR LARGE (A) 04/11/2020 0153    Sepsis Labs: Lactic Acid, Venous    Component Value Date/Time   LATICACIDVEN 1.0 03/09/2020 0959    MICROBIOLOGY: Recent Results (from the past 240 hour(s))  SARS Coronavirus 2 by RT PCR (hospital order, performed in [facility] hospital lab) Nasopharyngeal Nasopharyngeal Swab     Status: None   Collection Time: 05/12/20 12:01 PM   Specimen: Nasopharyngeal Swab  Result Value Ref Range Status   SARS Coronavirus 2 NEGATIVE NEGATIVE Final    Comment: (NOTE) SARS-CoV-2 target nucleic acids are NOT DETECTED. The SARS-CoV-2 RNA is generally detectable in upper and lower respiratory specimens during the acute phase of infection. The lowest concentration of SARS-CoV-2 viral copies this assay can detect is 250 copies / mL. A negative result does not preclude SARS-CoV-2 infection and should not be used  as the sole basis for treatment or other patient management decisions.  A negative result may occur with improper specimen collection / handling, submission of specimen other than nasopharyngeal swab, presence of viral mutation(s) within the areas targeted by this assay, and inadequate number of viral copies (<250 copies / mL). A negative result must be combined with clinical observations, patient history, and epidemiological information. Fact Sheet for Patients:   StrictlyIdeas.no Fact Sheet for Healthcare Providers: BankingDealers.co.za This test is not yet approved or cleared  by the [place] FDA and has been authorized for detection and/or diagnosis of SARS-CoV-2 by FDA under an Emergency Use Authorization (EUA).  This EUA will remain in effect (meaning this  test can be used) for the duration of the COVID-19 declaration under Section 564(b)(1) of the Act, 21 U.S.C. section 360bbb-3(b)(1), unless the authorization is terminated or revoked sooner. Performed at [facility], [place] [address] [place], [place] [address]     RADIOLOGY STUDIES/RESULTS: No results found.   LOS: 70 days   Oren Binet, MD  [facility]    To contact the attending provider between 7A-7P or the covering provider during after hours 7P-7A, please log into the web site [email or web address] and access using universal [place] password for that web site. If you do not have the password, please call the hospital operator.  05/16/2020, 1:28 PM

## 2020-05-16 NOTE — Consult Note (Addendum)
[facility] Consult   Reason for Consult:  "Hallucinations/delusions/agitation-inspite of being on psychotropic agents-please assist in medication management, thanks." Referring Physician:  Dr Sloan Leiter Patient Identification: Kenneth Sullivan MRN:  [id number] Principal Diagnosis: Cardiac arrest [facility]) Diagnosis:  Principal Problem:   Cardiac arrest [facility]) Active Problems:   Encounter for central line placement   GI bleed   Acute respiratory failure ([place])   Anoxic encephalopathy ([place])   Cerebral embolism with cerebral infarction   Pulmonary emboli ([place])   Palliative care by specialist   Goals of care, counseling/discussion   DNR (do not resuscitate)   Status post tracheostomy ([place])   Protein-calorie malnutrition, severe   Total Time spent with patient: 45 minutes  Subjective: Patient states "I had an accident beside the window, a bad accident, I am trying to get to rehab so me and my family do not break up." Patient gestures toward window in hospital room-reports "I had a metal rail stuck through my head."   HPI: Kenneth Sullivan is a 64 y.o. male patient.  Patient alert and participating in psychiatric assessment.  Patient oriented to self only at this time.  Patient pleasant and cooperative, currently confused.  Patient reports that it is currently September patient unable to identify current president of the [place]. Patient believes sitter at bedside is his niece. Patient denies suicidal homicidal ideations.  Patient denies auditory and visual hallucinations.  Patient denies symptoms of paranoia. Patient reports "I took mental meds but it has been so long ago, I cannot remember them."  Patient reports he does not currently see outpatient psychiatry but has a plan to initiate care with a psychiatrist. Patient reports he is from [place] but lives in [place] with his fiance.  Patient denies access to weapons.  Patient currently not working outside of home, receives  SSI.  Patient denies alcohol and substance use. Patient gives verbal consent to speak with his niece, per medical record niece [name] phone number [phone]. Spoke with patient's niece, [name], who reports concerns regarding "uti related to condom catheter." Mrs. [name] reports "he was incarcerated and the department of corrections put him on medications." Mrs. [name] does not have information regarding psychotropic medication history.      Past Psychiatric History: Depression  Risk to Self:   Denies Risk to Others:   Denies Prior Inpatient Therapy:   None reported Prior Outpatient Therapy:   None reported  Past Medical History:  Past Medical History:  Diagnosis Date  . Cardiac arrest ([place]) 03/07/2020  . Known health problems: none    No recent medical care, has not had a physical in years    Past Surgical History:  Procedure Laterality Date  . BIOPSY  05/12/2020   Procedure: BIOPSY;  Surgeon: Otis Brace, MD;  Location: [facility] ENDOSCOPY;  Service: Gastroenterology;;  . ESOPHAGOGASTRODUODENOSCOPY N/A 05/12/2020   Procedure: ESOPHAGOGASTRODUODENOSCOPY (EGD);  Surgeon: Otis Brace, MD;  Location[facility] END[facility]COPY;  Service: Gastroenterology;  Laterality: N/A;  . ESOPHAGOGASTRODUODENOSCOPY (EGD) WITH PROPOFOL N/A 03/07/2020   Procedure: ESOPHAGOGASTRODUODENOSCOPY (EGD) WITH PROPOFOL;  Surgeon: Wilford Corner, MD;  Location: [facility];  Service: Endoscopy;  Laterality: N/A;  . IR ANGIOGRAM PULMONARY BILATERAL SELECTIVE  03/07/2020  . IR ANGIOGRAM SELECTIVE EACH ADDITIONAL VESSEL  03/07/2020  . IR ANGIOGRAM SELECTIVE EACH ADDITIONAL VESSEL  03/07/2020  . IR ANGIOGRAM SELECTIVE EACH ADDITIONAL VESSEL  03/07/2020  . IR ANGIOGRAM VISCERAL SELECTIVE  03/07/2020  . IR EMBO ART  VEN [place] GUIDE ROADMAPPING  03/07/2020  . IR IVC FILTER PLMT / S&I /IMG GUID/MOD SED  03/07/2020  . IR THROMBECT PRIM MECH INIT (INCLU) MOD SED  03/07/2020  . IR US GUIDE VASC ACCESS  RIGHT  03/07/2020  . IR US GUIDE VASC ACCESS RIGHT  03/07/2020  . IR US GUIDE VASC ACCESS RIGHT  03/07/2020   Family History:  Family History  Problem Relation Age of Onset  . Heart attack Neg Hx    Family Psychiatric  History: None reported Social History:  Social History   Substance and Sexual Activity  Alcohol Use Yes  . Alcohol/week: 21.0 standard drinks  . Types: 21 Cans of beer per week   Comment: 2 or 3  -40 ounce beers a day     Social History   Substance and Sexual Activity  Drug Use Yes  . Types: Marijuana    Social History   Socioeconomic History  . Marital status: Single    Spouse name: Not on file  . Number of children: Not on file  . Years of education: Not on file  . Highest education level: Not on file  Occupational History  . Not on file  Tobacco Use  . Smoking status: Current Every Day Smoker    Packs/day: 1.00    Types: Cigarettes  . Smokeless tobacco: Never Used  Substance and Sexual Activity  . Alcohol use: Yes    Alcohol/week: 21.0 standard drinks    Types: 21 Cans of beer per week    Comment: 2 or 3  -40 ounce beers a day  . Drug use: Yes    Types: Marijuana  . Sexual activity: Not on file  Other Topics Concern  . Not on file  Social History Narrative   Lives with wife.  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   Social Determinants of Health   Financial Resource Strain:   . Difficulty of Paying Living Expenses:   Food Insecurity:   . Worried About Charity fundraiser in the Last Year:   . Arboriculturist in the Last Year:   Transportation Needs:   . Film/video editor (Medical):   Marland Kitchen Lack of Transportation (Non-Medical):   Physical Activity:   . Days of Exercise per Week:   . Minutes of Exercise per Session:   Stress:   . Feeling of Stress :   Social Connections:   . Frequency of Communication with Friends and Family:   . Frequency of Social Gatherings with Friends and Family:   . Attends Religious Services:   .  Active Member of Clubs or Organizations:   . Attends Archivist Meetings:   Marland Kitchen Marital Status:    Additional Social History:    Allergies:  No Known Allergies  Labs:  Results for orders placed or performed during the hospital encounter of 03/07/20 (from the past 48 hour(s))  CBC     Status: Abnormal   Collection Time: 05/15/20  3:52 AM  Result Value Ref Range   WBC 7.7 4.0 - 10.5 K/uL   RBC 3.69 (L) 4.22 - 5.81 MIL/uL   Hemoglobin 10.7 (L) 13.0 - 17.0 g/dL   HCT 33.3 (L) 39.0 - 52.0 %   MCV 90.2 80.0 - 100.0 fL   MCH 29.0 26.0 - 34.0 pg   MCHC 32.1 30.0 - 36.0 g/dL   RDW 16.9 (H) 11.5 - 15.5 %   Platelets 500 (H) 150 - 400 K/uL   nRBC 0.0 0.0 - 0.2 %  Comment: Performed at [facility], [place] [address], [place], [place] [address]  Basic metabolic panel     Status: Abnormal   Collection Time: 05/15/20  3:52 AM  Result Value Ref Range   Sodium 140 135 - 145 mmol/L   Potassium 3.8 3.5 - 5.1 mmol/L   Chloride 106 98 - 111 mmol/L   CO2 23 22 - 32 mmol/L   Glucose, Bld 88 70 - 99 mg/dL    Comment: Glucose reference range applies only to samples taken after fasting for at least 8 hours.   BUN 11 8 - 23 mg/dL   Creatinine, Ser 0.84 0.61 - 1.24 mg/dL   Calcium 8.6 (L) 8.9 - 10.3 mg/dL   GFR calc non Af Amer >60 >60 mL/min   GFR calc Af Amer >60 >60 mL/min   Anion gap 11 5 - 15    Comment: Performed at [facility] [address] [place], [place] [address]    Current Facility-Administered Medications  Medication Dose Route Frequency Provider Last Rate Last Admin  . 0.9 %  sodium chloride infusion   Intravenous PRN Minor, Grace Bushy, NP      . acetaminophen (TYLENOL) tablet 650 mg  650 mg Oral Q6H PRN Mercy Riding, MD   650 mg at 05/12/20 2019  . chlorhexidine gluconate (MEDLINE KIT) (PERIDEX) 0.12 % solution 15 mL  15 mL Mouth Rinse BID Minor, Grace Bushy, NP   15 mL at 05/15/20 2151  . docusate sodium (COLACE) capsule 100 mg  100 mg Oral BID PRN Minor,  Grace Bushy, NP   100 mg at 04/30/20 2202  . feeding supplement (ENSURE ENLIVE) (ENSURE ENLIVE) liquid 237 mL  237 mL Oral QID Wendee Beavers T, MD   237 mL at 05/16/20 0937  . FLUoxetine (PROZAC) capsule 10 mg  10 mg Oral QHS Suella Broad, FNP   10 mg at 05/15/20 2152  . guaiFENesin-dextromethorphan (ROBITUSSIN DM) 100-10 MG/5ML syrup 10 mL  10 mL Oral Q4H PRN Wendee Beavers T, MD   10 mL at 05/15/20 2152  . hydrALAZINE (APRESOLINE) injection 5 mg  5 mg Intravenous Q8H PRN Hosie Poisson, MD      . ipratropium-albuterol (DUONEB) 0.5-2.5 (3) MG/3ML nebulizer solution 3 mL  3 mL Nebulization Q6H PRN Chesley Mires, MD      . LORazepam (ATIVAN) injection 1 mg  1 mg Intravenous Q4H PRN Jonetta Osgood, MD   1 mg at 05/15/20 1630  . LORazepam (ATIVAN) tablet 2 mg  2 mg Oral Q6H PRN Regalado, Belkys A, MD   2 mg at 05/15/20 2152  . OXcarbazepine (TRILEPTAL) tablet 75 mg  75 mg Oral BID Suella Broad, FNP   75 mg at 05/15/20 2157  . oxyCODONE (Oxy IR/ROXICODONE) immediate release tablet 5 mg  5 mg Oral Q6H PRN Mercy Riding, MD   5 mg at 05/15/20 2152  . pantoprazole (PROTONIX) EC tablet 40 mg  40 mg Oral BID Wendee Beavers T, MD   40 mg at 05/15/20 2152  . polyethylene glycol (MIRALAX / GLYCOLAX) packet 17 g  17 g Oral Daily Mariel Aloe, MD   17 g at 05/16/20 4627  . QUEtiapine (SEROQUEL) tablet 75 mg  75 mg Oral BID Jonetta Osgood, MD   75 mg at 05/15/20 2151  . sodium chloride flush (NS) 0.9 % injection 10-40 mL  10-40 mL Intracatheter PRN Minor, Grace Bushy, NP   10 mL at 05/15/20  1200    Musculoskeletal: Strength & Muscle Tone: decreased Gait & Station: unable to assess Patient leans: N/A  Psychiatric Specialty Exam: Physical Exam  Nursing note and vitals reviewed. Constitutional: He appears well-developed.  HENT:  Head: Normocephalic.  Cardiovascular: Normal rate.  Respiratory: Effort normal.  Neurological: He is alert.  Psychiatric: He has a normal mood and affect. His  behavior is normal. His speech is tangential. Thought content is delusional. Cognition and memory are impaired.    Review of Systems  Constitutional: Negative.   HENT: Negative.   Eyes: Negative.   Respiratory: Negative.   Cardiovascular: Negative.   Gastrointestinal: Negative.   Genitourinary: Negative.   Musculoskeletal: Negative.   Skin: Negative.   Neurological: Negative.   Psychiatric/Behavioral: Positive for confusion.    Blood pressure 101/74, pulse 95, temperature 98.3 F (36.8 C), temperature source Oral, resp. rate 18, height 6' 2"  (1.88 m), weight 63 kg, SpO2 98 %.Body mass index is 17.85 kg/m.  General Appearance: Casual  Eye Contact:  Fair  Speech:  Clear and Coherent  Volume:  Normal  Mood:  Euthymic  Affect:  Congruent  Thought Process:  Disorganized and Descriptions of Associations: Tangential  Orientation:  Other:  oriented to self only   Thought Content:  Tangential  Suicidal Thoughts:  No  Homicidal Thoughts:  No  Memory:  Immediate;   Poor Recent;   Fair Remote;   Fair  Judgement:  Impaired  Insight:  Lacking  Psychomotor Activity:  Decreased  Concentration:  Concentration: Poor  Recall:  Poor  Fund of Knowledge:  Fair  Language:  Fair  Akathisia:  No  Handed:  Right  AIMS (if indicated):     Assets:  Communication Skills Desire for Improvement Financial Resources/Insurance Housing Intimacy Leisure Time Physical Health Resilience Social Support Talents/Skills  ADL's:  Impaired  Cognition:  Impaired,  Moderate  Sleep:        Treatment Plan Summary: Patient discussed with Dr.Clary.   Patient is a 64 year old male admitted post cardiac arrest. Patient is confused and cooperative upon my evaluation. EKG ordered to assess QT interval.  Based on my assessment today patient does not require psychiatric inpatient admission.  Recommendations: Recommend continue Seroquel 39m each morning.   Recommend consider increase evening Seroquel from  75 mg nightly to 150 mg nightly.   Disposition: No evidence of imminent risk to self or others at present.   Patient does not meet criteria for psychiatric inpatient admission.  TEmmaline Kluver FNP 05/16/2020 9:40 AM

## 2020-05-17 MED ORDER — CLONAZEPAM 0.5 MG PO TABS
0.5000 mg | ORAL_TABLET | Freq: Two times a day (BID) | ORAL | Status: DC
  Administered 2020-05-17 – 2020-07-03 (×90): 0.5 mg via ORAL
  Filled 2020-05-17 (×94): qty 1

## 2020-05-17 NOTE — Plan of Care (Signed)
  Problem: Role Relationship: Goal: Method of communication will improve Outcome: Progressing   Problem: Education: Goal: Knowledge of General Education information will improve Description: Including pain rating scale, medication(s)/side effects and non-pharmacologic comfort measures Outcome: Progressing   Problem: Health Behavior/Discharge Planning: Goal: Ability to manage health-related needs will improve Outcome: Progressing

## 2020-05-17 NOTE — Progress Notes (Signed)
PROGRESS NOTE        PATIENT DETAILS Name: Kenneth Sullivan Age: 64 y.o. Sex: male Date of Birth: 17-May-1956 Admit Date: 03/07/2020 Admitting Physician Marshell Garfinkel, MD [id number], [name]  Brief Narrative: 64 year old with no significant past medical history.  Had acute shortness of breath with sats in the 40s on EMS arrival.  He had a witnessed bradycardic, asystolic arrest when EMS was present with 3 rounds of epi, 20 minutes to ROSC. Underwent normothermia protocol.  Work-up significant for massive PE, DVT with RV strain-unfortunately for the hospital course complicated by upper GI bleeding due to gastric ulcer.  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  Significant events: 3/23>> Admit, EGD with findings of oozing gastric ulcer with clot, IR for mechanical thrombectomy, IVC filter and GDA embolization 4/5>>Trach 4/28>>trach decannulated  Significant studies: CTA 3/23 >> bilateral PE with RV/with ratio 1.9, emphysema CT head 3/23 >> chronic microvascular changes.  No acute findings Echo 3/23 >> RV is severely dilated with reduced function and D-shaped septum, LVEF 60-65%. Venogram 3/23>> occlusive DVT of the iliac vein extending to the lower IVC LE Venous Duplex 3/24 >> acute DVT in BLE up to the femoral vein  MRI Brain 3/28 >> multifocal acute to subacute ischemic infarcts involving bilateral cerebral hemispheres, findings consistent with global hypoperfusion event related to cardiac arrest, associated scattered petechial hemorrhage about many areas of ischemia, evidence of hemorrhagic conversion in the right parietal lobe  Antimicrobial therapy: Doxycycline: 4/9>> 4/17  Microbiology data: 3/23: Blood culture>> negative so far 4/10: Tracheal aspirate culture>> normal respiratory flora 4/10: Blood culture>> no growth 4/27: Urine culture>>  Proteus mirabilis 3/23:Sars COV2 3 >> negative  Surgical pathology: 5/19 >>liver biopsy: Neuroendocrine tumor 5/28>> gastric ulcer biopsy by EGD-adenocarcinoma  Procedures : 3/23-3/24>> LTM EEG: No seizures-moderate to severe diffuse encephalopathy 3/23>> spot EEG: No seizures, severe to profound diffuse encephalopathy 3/23>> placement of retrievable IVC filter, pulmonary angiogram and mechanical/aspiration thrombectomy 3/23 >>EGD >> large gastric ulcer 3/23 >>embolization of right gastric artery/gastroduodenal artery 3/23 by IR>> 3/23 >>4/5 ETT 4/5>>Trach >>changed to 6 cuffless 4/22 >> decannulated 4/28 5/19>> ultrasound-guided liver biopsy 5/28>> EGD with biopsy of gastric ulcer-adenocarcinoma  Consults: CCM, IR, neurology, cardiology, oncology, [facility]  DVT Prophylaxis : SCD's  Subjective: Confused-but sleeping this morning.  Agitated the entire day yesterday-somewhat more calmer today-bedside sitter in place.  Assessment/Plan: Acute hypoxic respiratory failure in the setting of PE/cardiac arrest: Managed in the ICU-s/p tracheotomy-subsequently decannulated-currently stable on room air.  Cardiac arrest: Secondary to massive PE-continue telemetry monitoring.  Patient is not a long-term anticoagulation candidate given large gastric ulcer-secondary to neuroendocrine tumor-given risk of massive GI bleeding.  Massive PE with DVT- s/p IVC filter: Not anticoagulation candidate-given malignant gastric ulcer with high risk potential for bleeding.  Is s/p IVC filter placement and mechanical thrombectomy.  Anoxic encephalopathy: Pleasantly confused-able to answer simple questions.  Upper GI bleeding secondary to large malignant gastric ulcer with acute blood loss anemia: Underwent EGD x2-and subsequently embolization of GDA by IR.  No further bleeding apparent-on PPI.  Hemoglobin stable. EGD with biopsy showing adenocarcinoma.  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  Long discussion with oncology-Dr. Benay Spice on 5/27 -we both agree that it is probably best in this current condition to pursue palliative/hospice care. Family aware of our recommendation-family discussion in progress. No further inpatient work-up recommended by oncology-if patient is discharged to a facility here in town-patient will follow with oncology clinic at the cancer center-if discharged to a facility out of town-we will need local oncology follow-up.  Dysphagia: Secondary to anoxia-SLP following.  Delusions/hallucinations: Psych consulted-currently relatively stable-ICU delirium likely a component-continue antipsychotics.  Bedside sitter in place.   Nutrition Problem: Nutrition Problem: Severe Malnutrition Etiology: chronic illness(gastric lesion, multiple liver lesions, concern for metastatic disease) Signs/Symptoms: severe fat depletion, severe muscle depletion Interventions: Ensure Enlive (each supplement provides 350kcal and 20 grams of protein), Magic cup   Diet: Diet Order            Diet regular Room service appropriate? Yes; Fluid consistency: Thin  Diet effective now               Code Status:  DNR  Family Communication: Spoke with niece [name] [phone] 5/31-we will update over the next few days.  Disposition Plan: Status is: Inpatient  Remains inpatient appropriate because:Inpatient level of care appropriate due to severity of illness  Dispo: The patient is from: Home              Anticipated d/c is to: SNF              Anticipated d/c date is: 1 day              Patient currently is not medically stable to d/c.   Barriers to Discharge: SNF bed pending  Antimicrobial agents: Anti-infectives (From admission, onward)   Start     Dose/Rate Route  Frequency Ordered Stop   03/26/20 1100  doxycycline (VIBRAMYCIN) 100 mg in sodium chloride 0.9 % 250 mL IVPB  Status:  Discontinued     100 mg 125 mL/hr over 120 Minutes Intravenous 2 times daily 03/26/20 1002 04/01/20 1035   03/25/20 1100  doxycycline (VIBRA-TABS) tablet 100 mg  Status:  Discontinued     100 mg Per Tube Every 12 hours 03/25/20 1009 03/26/20 1002   03/25/20 1015  doxycycline (VIBRAMYCIN) 100 mg in sodium chloride 0.9 % 250 mL IVPB  Status:  Discontinued    Note to Pharmacy: To be given after the tracheal aspirate is collected.   100 mg 125 mL/hr over 120 Minutes Intravenous Every 12 hours 03/25/20 1005 03/25/20 1008       Time spent: 15- minutes-Greater than 50% of this time was spent in counseling, explanation of diagnosis, planning of further management, and coordination of care.  MEDICATIONS: Scheduled Meds: . chlorhexidine gluconate (MEDLINE KIT)  15 mL Mouth Rinse BID  . feeding supplement (ENSURE ENLIVE)  237 mL Oral QID  . FLUoxetine  10 mg Oral QHS  . OXcarbazepine  75 mg Oral BID  . pantoprazole  40 mg Oral BID  . polyethylene glycol  17 g Oral Daily  . QUEtiapine  75 mg Oral BID   Continuous Infusions: . sodium chloride     PRN Meds:.sodium chloride, acetaminophen, docusate sodium, guaiFENesin-dextromethorphan, hydrALAZINE, ipratropium-albuterol, LORazepam, LORazepam **OR** [DISCONTINUED] LORazepam, oxyCODONE, sodium chloride flush   PHYSICAL EXAM: Vital signs: Vitals:   05/16/20 1655 05/16/20 2320 05/17/20 0746 05/17/20 0955  BP: (!) 135/99 99/65 105/80   Pulse: (!) 102 92 (!) 103   Resp: 20 20 17    Temp: (!) 97.4 F (36.3 C)  98.6 F (37 C)   TempSrc:   Oral   SpO2: 100% 100% 100% 98%  Weight:      Height:       Filed Weights   04/29/20 1557 04/30/20 0500 05/01/20 0648  Weight: 63 kg 63.9 kg 63 kg   Body mass index is 17.85 kg/m.   Gen Exam: Confused but not in any distress. HEENT:atraumatic, normocephalic Chest: B/L clear to  auscultation anteriorly CVS:S1S2 regular Abdomen:soft non tender, non distended Extremities:no edema Neurology: Non focal Skin: no rash  I have personally reviewed following labs and imaging studies  LABORATORY DATA: CBC: Recent Labs  Lab 05/12/20 0310 05/15/20 0352  WBC 6.9 7.7  HGB 10.0* 10.7*  HCT 31.2* 33.3*  MCV 90.4 90.2  PLT 531* 500*    Basic Metabolic Panel: Recent Labs  Lab 05/12/20 0310 05/15/20 0352  NA 140 140  K 3.9 3.8  CL 108 106  CO2 27 23  GLUCOSE 98 88  BUN 13 11  CREATININE 0.84 0.84  CALCIUM 8.8* 8.6*    GFR: Estimated Creatinine Clearance: 81.4 mL/min (by C-G formula based on SCr of 0.84 mg/dL).  Liver Function Tests: No results for input(s): AST, ALT, ALKPHOS, BILITOT, PROT, ALBUMIN in the last 168 hours. No results for input(s): LIPASE, AMYLASE in the last 168 hours. No results for input(s): AMMONIA in the last 168 hours.  Coagulation Profile: No results for input(s): INR, PROTIME in the last 168 hours.  Cardiac Enzymes: No results for input(s): CKTOTAL, CKMB, CKMBINDEX, TROPONINI in the last 168 hours.  BNP (last 3 results) No results for input(s): PROBNP in the last 8760 hours.  Lipid Profile: No results for input(s): CHOL, HDL, LDLCALC, TRIG, CHOLHDL, LDLDIRECT in the last 72 hours.  Thyroid Function Tests: No results for input(s): TSH, T4TOTAL, FREET4, T3FREE, THYROIDAB in the last 72 hours.  Anemia Panel: No results for input(s): VITAMINB12, FOLATE, FERRITIN, TIBC, IRON, RETICCTPCT in the last 72 hours.  Urine analysis:    Component Value Date/Time   COLORURINE AMBER (A) 04/11/2020 0153   APPEARANCEUR CLOUDY (A) 04/11/2020 0153   LABSPEC 1.024 04/11/2020 0153   PHURINE 7.0 04/11/2020 0153   GLUCOSEU NEGATIVE 04/11/2020 0153   HGBUR SMALL (A) 04/11/2020 0153   BILIRUBINUR NEGATIVE 04/11/2020 0153   KETONESUR NEGATIVE 04/11/2020 0153   PROTEINUR 100 (A) 04/11/2020 0153   NITRITE NEGATIVE 04/11/2020 0153    LEUKOCYTESUR LARGE (A) 04/11/2020 0153    Sepsis Labs: Lactic Acid, Venous    Component Value Date/Time   LATICACIDVEN 1.0 03/09/2020 0959    MICROBIOLOGY: Recent Results (from the past 240 hour(s))  SARS Coronavirus 2 by RT PCR (hospital order, performed in [place] hospital lab) Nasopharyngeal Nasopharyngeal Swab     Status: None   Collection Time: 05/12/20 12:01 PM   Specimen: Nasopharyngeal Swab  Result Value Ref Range Status   SARS Coronavirus 2 NEGATIVE NEGATIVE Final    Comment: (NOTE) SARS-CoV-2 target nucleic acids are NOT DETECTED. The SARS-CoV-2 RNA is generally detectable in upper and lower respiratory specimens during the acute phase of infection. The lowest concentration of SARS-CoV-2 viral copies this assay can detect is 250 copies / mL. A negative result does not preclude SARS-CoV-2 infection and should not be used as the sole basis for treatment or other patient management decisions.  A negative result may occur with improper specimen collection / handling, submission of specimen other than nasopharyngeal swab, presence of viral mutation(s) within the areas targeted by this assay, and inadequate  number of viral copies (<250 copies / mL). A negative result must be combined with clinical observations, patient history, and epidemiological information. Fact Sheet for Patients:   StrictlyIdeas.no Fact Sheet for Healthcare Providers: BankingDealers.co.za This test is not yet approved or cleared  by the [place] FDA and has been authorized for detection and/or diagnosis of SARS-CoV-2 by FDA under an Emergency Use Authorization (EUA).  This EUA will remain in effect (meaning this test can be used) for the duration of the COVID-19 declaration under Section 564(b)(1) of the Act, 21 U.S.C. section 360bbb-3(b)(1), unless the authorization is terminated or revoked sooner. Performed at [facility], [place]  [address] [place], [place] [address]     RADIOLOGY STUDIES/RESULTS: No results found.   LOS: 71 days   Oren Binet, MD  [facility]    To contact the attending provider between 7A-7P or the covering provider during after hours 7P-7A, please log into the web site [email or web address] and access using universal [place] password for that web site. If you do not have the password, please call the hospital operator.  05/17/2020, 2:10 PM

## 2020-05-17 NOTE — Progress Notes (Signed)
Occupational Therapy Treatment Patient Details Name: Kenneth Sullivan MRN: [id number] DOB: [birth year] Today's Date: 05/17/2020    History of present illness Pt is 64 yo male with unknown medical hx.  He presented to ED on 3/23 with acute SHOB.  He had bradycardiac event that lead to asystole/cardiac arrest (ROSC achieved in 20 mins with 3 rounds epinephrine).  Pt was intubated and admitted to ICU.  Pt with massive PE/DVT and RV strain.  He underwent IR guided mechanical thrombectomy and IVC Filter.   Pt developed gastric ulcer and required GDA embolization.  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 Pt s/p trach on 4/5 and changed to cuffless on 4/22--decannualted 4/28   OT comments  Patient continues to make progress towards goals in skilled OT session. Patient's session encompassed co-treat with PT in order to complete a shower in session. Pt continues to require max encouragement in order to maintain attention to task, and continues to demonstrate significant deficits in motor planning to transfer into shower, onto shower seat, and back to bed. Pt continues to demonstrate difficulty with regard to vision, however is unwilling to participate in further visual assessment (often needs items placed in hand to orient). Discharge remains appropriate at this time; will continue to follow acutely.    Follow Up Recommendations  SNF;Supervision/Assistance - 24 hour    Equipment Recommendations  Other (comment)(TBD)    Recommendations for Other Services      Precautions / Restrictions Precautions Precautions: Fall Precaution Comments: impulsive; can become quickly agitated Restrictions Weight Bearing Restrictions: No       Mobility Bed Mobility Overal  bed mobility: Needs Assistance Bed Mobility: Supine to Sit;Sit to Supine     Supine to sit: Min assist;HOB elevated Sit to supine: Min assist;HOB elevated      Transfers Overall transfer level: Needs assistance Equipment used: 2 person hand held assist Transfers: Sit to/from Stand Sit to Stand: Min assist;+2 physical assistance;+2 safety/equipment;From elevated surface         General transfer comment: Min a +2 in order to power up with increased repetitions, while patient could probably ambulate with HHA of 1 pt remains anxious and extends arms to "furniture surf" when ambulatio without 2 person HHA    Balance Overall balance assessment: Needs assistance Sitting-balance support: No upper extremity supported;Feet supported Sitting balance-Leahy Scale: Fair     Standing balance support: Single extremity supported;Bilateral upper extremity supported Standing balance-Leahy Scale: Poor Standing balance comment: reliant on external support                           ADL either performed or assessed with clinical judgement   ADL Overall ADL's : Needs assistance/impaired         Upper Body Bathing: Moderate assistance;Standing;Cueing for safety;Cueing for sequencing Upper Body Bathing Details (indicate cue type and reason): Pt wanting to take shower to date, pt able to get chest and peri-care area, however became anxious and required assist to complete Lower Body Bathing: Moderate assistance;Cueing for safety;Cueing for sequencing;Sit to/from stand Lower Body Bathing Details (indicate cue type and reason): Pt wanting to take shower to date, pt able to get chest and peri-care area, however became anxious and required assist to complete Upper Body Dressing : Moderate assistance;Maximal assistance;Cueing for sequencing Upper  Body Dressing Details (indicate cue type and reason): Continues to demonstrate difficulty with vision, sequencing, and motor planning to thread arms  through sleeves Lower Body Dressing: Maximal assistance Lower Body Dressing Details (indicate cue type and reason): Able to start sock on L unable to start on R, however once manipulated halfway, pt would willingly adjust Toilet Transfer: Minimal assistance;Ambulation Toilet Transfer Details (indicate cue type and reason): 2 person HHA for safety, requires max encouragement and redirection to stay on task as pt will often state "Im so scared"         Functional mobility during ADLs: Moderate assistance;Maximal assistance;Cueing for sequencing;Cueing for safety General ADL Comments: Shower completed in session to date, requiring increased assist because of increased demand, but able to stand for the entirety of the shower with LOB noted (super close min A at all times provided)     Vision   Vision Assessment?: Vision impaired- to be further tested in functional context Additional Comments: difficult to assess, but required increased assist to find washcloths, soap, and end of the sock   Perception     Praxis      Cognition Arousal/Alertness: Awake/alert Behavior During Therapy: Anxious Overall Cognitive Status: Impaired/Different from baseline Area of Impairment: Orientation;Attention;Following commands;Safety/judgement;Awareness;Problem solving;Memory                 Orientation Level: Disoriented to;Time;Situation;Place(Referring to the hosptial frequently as "this house") Current Attention Level: Focused Memory: Decreased short-term memory Following Commands: Follows one step commands inconsistently Safety/Judgement: Decreased awareness of deficits;Decreased awareness of safety Awareness: Intellectual Problem Solving: Difficulty sequencing;Requires tactile cues;Requires verbal cues;Decreased initiation;Slow processing General Comments: Increasingly agreeable in session, however requires max redirection to stay on task due to anxiety of falling        Exercises      Shoulder Instructions       General Comments      Pertinent Vitals/ Pain       Pain Assessment: No/denies pain Faces Pain Scale: No hurt  Home Living                                          Prior Functioning/Environment              Frequency  Min 2X/week        Progress Toward Goals  OT Goals(current goals can now be found in the care plan section)  Progress towards OT goals: Progressing toward goals  Acute Rehab OT Goals Patient Stated Goal: Pt unable OT Goal Formulation: Patient unable to participate in goal setting Time For Goal Achievement: 05/18/20 Potential to Achieve Goals: [place] Discharge plan remains appropriate    Co-evaluation    PT/OT/SLP Co-Evaluation/Treatment: Yes Reason for Co-Treatment: For patient/therapist safety PT goals addressed during session: Mobility/safety with mobility OT goals addressed during session: ADL's and self-care      AM-PAC OT "6 Clicks" Daily Activity     Outcome Measure   Help from another person eating meals?: A Lot Help from another person taking care of personal grooming?: A Lot Help from another person toileting, which includes using toliet, bedpan, or urinal?: A Lot Help from another person bathing (including washing, rinsing, drying)?: A Lot Help from another person to put on and taking off regular upper body clothing?: A Lot Help from another person to put on and taking off regular lower body clothing?: A Lot 6  Click Score: 12    End of Session    OT Visit Diagnosis: Unsteadiness on feet (R26.81);Other abnormalities of gait and mobility (R26.89);Muscle weakness (generalized) (M62.81);Low vision, both eyes (H54.2);Other symptoms and signs involving cognitive function   Activity Tolerance Patient tolerated treatment well   Patient Left in bed;with call bell/phone within reach;with bed alarm set   Nurse Communication Mobility status        Time: [id number] OT Time  Calculation (min): 30 min  Charges: OT General Charges $OT Visit: 1 Visit OT Treatments $Self Care/Home Management : 8-22 mins  Corinne Ports E. Hopkins, [facility] [place] 05/17/2020, 1:10 PM

## 2020-05-17 NOTE — Progress Notes (Addendum)
Nutrition Follow-up  DOCUMENTATION CODES:   Underweight, Severe malnutrition in context of chronic illness  INTERVENTION:   Obtain new weight   - Feeding assistance with meals and snacks  -Ensure Enlive poQID, each supplement provides 350 kcal and 20 grams of protein  -MagicCup TID with meals, each supplement provides 290 kcal and 9 grams of protein  - Encourage adequate PO intake  NUTRITION DIAGNOSIS:   Severe Malnutrition related to chronic illness (gastric lesion, multiple liver lesions, concern for metastatic disease) as evidenced by severe fat depletion, severe muscle depletion.  Ongoing  GOAL:   Patient will meet greater than or equal to 90% of their needs  Progressing  MONITOR:   PO intake, Supplement acceptance, Labs, Weight trends  REASON FOR ASSESSMENT:   Ventilator    ASSESSMENT:   64 yo male admitted S/P cardiac arrest, S/P normothermia protocol. Found to have massive PE, DVT, GI bleed from gastric ulcer. PMH includes current smoker, heavy alcohol use.  4/02 - trach  4/09 - Cortrak placed  4/11- pt pulled out trach, replaced by RT,TF held due to Cortrak leaking  4/13 - MBS, Dysphagia 1 with thin liquids started 4/14 - Cortrak removed 4/16 - Cortrak replaced 4/22 - Cortrak clogged, replaced by diagnostic radiology (tip in pre-pyloric region of stomach) 4/25 - diet advanced to Dysphagia 2 4/27 - tube feeds changed to nocturnal 4/28 - decannulated 4/30 - pulled Cortrak, TF stopped 5/03 - diet upgraded to Dysphagia 3 5/05 - diet upgraded to Regular 5/19 - s/p liver lesion biopsy by IR  RD working remotely. Pt awaiting EGD biopsy results.   Unable to reach pt by phone. Meal completions lack documentation. Per MAR pt taking Ensure 3-4 times daily. Per RN meal intake fluctates day by day. Continue to push Ensure supplements and encourage PO intake.   Admission weight: 72.6 kg  Recent weight: 63 kg (taken 5/17)  Medications: miralax Labs:  CBG 96-127  Diet Order:   Diet Order            Diet regular Room service appropriate? Yes; Fluid consistency: Thin  Diet effective now              EDUCATION NEEDS:   Not appropriate for education at this time  Skin:  Skin Assessment: Reviewed RN Assessment  Last BM:  5/31  Height:   Ht Readings from Last 1 Encounters:  04/29/20 6\' 2"  (1.88 m)    Weight:   Wt Readings from Last 1 Encounters:  05/01/20 63 kg    Ideal Body Weight:  86.4 kg  BMI:  Body mass index is 17.85 kg/m.  Estimated Nutritional Needs:   Kcal:  2100-2300  Protein:  110-125 gm  Fluid:  >/= 2 L  Mariana Single RD, LDN Clinical Nutrition Pager listed in [facility]

## 2020-05-17 NOTE — Progress Notes (Signed)
Physical Therapy Treatment Patient Details Name: Kenneth Sullivan MRN: [id number] DOB: 08-18-1956 Today's Date: 05/17/2020    History of Present Illness Pt is 64 yo male with unknown medical hx.  He presented to ED on 3/23 with acute SHOB.  He had bradycardiac event that lead to asystole/cardiac arrest (ROSC achieved in 20 mins with 3 rounds epinephrine).  Pt was intubated and admitted to ICU.  Pt with massive PE/DVT and RV strain.  He underwent IR guided mechanical thrombectomy and IVC Filter.   Pt developed gastric ulcer and required GDA embolization.  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 Pt s/p trach on 4/5 and changed to cuffless on 4/22--decannualted 4/28    PT Comments    Pt agreeable to mobility in order to take a shower and get some ice cream. Pt ambulated hallway distance with min +2 assist, pt unsteady and very fearful of falling. Pt also demonstrated good standing tolerance during showering task. Pt with noticeable motor planning deficits as well as apraxia this day. For example, pt having difficulty sitting down when cued to do so requiring physical assist to complete task, pt unable to lay back down and scoot up in bed without +2 assist, and pt eating ice cream holding onto spoon part as opposed to handle.   PT to continue to follow acutely.    Follow Up Recommendations  SNF     Equipment Recommendations  Rolling walker with 5" wheels    Recommendations for Other Services       Precautions / Restrictions Precautions Precautions: Fall Precaution Comments: impulsive, tires quickly, fear of falling, becomes agitated quickly Restrictions Weight Bearing Restrictions: No    Mobility  Bed Mobility Overal bed mobility: Needs  Assistance Bed Mobility: Supine to Sit;Sit to Supine     Supine to sit: Min assist;HOB elevated Sit to supine: Min assist;HOB elevated   General bed mobility comments: Min assist for supine<>sit for trunk and LE management, very increased time to return to supine because pt with difficulty sequencing task.  Transfers Overall transfer level: Needs assistance Equipment used: 2 person hand held assist Transfers: Sit to/from Stand Sit to Stand: Min assist;+2 physical assistance;+2 safety/equipment;From elevated surface         General transfer comment: Min assist +2 for power up, steadying. Pt requesting 2 HHA due to fear of falling, sit to stand x3, from EOB x1 and from [facility] x2 during showering task.  Ambulation/Gait Ambulation/Gait assistance: Min assist;+2 safety/equipment;+2 physical assistance Gait Distance (Feet): 120 Feet Assistive device: 2 person hand held assist Gait Pattern/deviations: Step-through pattern;Decreased stride length;Narrow base of support;Drifts right/left;Trunk flexed Gait velocity: decr   General Gait Details: Min assist +2 via HHA to steady, guide pt during hallway navigation. Pt motivated to ambulate to reach fridge for ice cream.   Stairs             Wheelchair Mobility    Modified Rankin (Stroke Patients Only) Modified Rankin (Stroke Patients Only) Pre-Morbid Rankin Score: No symptoms Modified Rankin: Moderately severe disability     Balance Overall balance assessment: Needs assistance Sitting-balance support: No upper extremity supported;Feet supported Sitting balance-Leahy Scale: Fair     Standing balance support: Single extremity supported;Bilateral upper extremity supported Standing balance-Leahy Scale: Poor Standing balance comment: reliant on external support  Cognition Arousal/Alertness: Awake/alert Behavior During Therapy: Anxious Overall Cognitive Status: Impaired/Different from  baseline Area of Impairment: Orientation;Attention;Following commands;Safety/judgement;Awareness;Problem solving;Memory                 Orientation Level: Disoriented to;Time;Situation;Place(Referring to the hosptial frequently as "this house") Current Attention Level: Focused Memory: Decreased short-term memory Following Commands: Follows one step commands inconsistently Safety/Judgement: Decreased awareness of deficits;Decreased awareness of safety Awareness: Intellectual Problem Solving: Difficulty sequencing;Requires tactile cues;Requires verbal cues;Decreased initiation;Slow processing General Comments: Motivated to mobilize with goal of showering, requires max multimodal cuing for safety and to stay on task. Fear of falling, states multiple times during session "I am so scared of falling, can you help me"      Exercises      General Comments        Pertinent Vitals/Pain Pain Assessment: No/denies pain Faces Pain Scale: No hurt Pain Intervention(s): Limited activity within patient's tolerance;Monitored during session    Home Living                      Prior Function            PT Goals (current goals can now be found in the care plan section) Acute Rehab PT Goals Patient Stated Goal: Pt unable PT Goal Formulation: With patient Time For Goal Achievement: 05/19/20 Potential to Achieve Goals: Fair Progress towards PT goals: Progressing toward goals    Frequency    Min 2X/week      PT Plan Current plan remains appropriate    Co-evaluation PT/OT/SLP Co-Evaluation/Treatment: Yes Reason for Co-Treatment: Necessary to address cognition/behavior during functional activity;For patient/therapist safety;To address functional/ADL transfers PT goals addressed during session: Mobility/safety with mobility OT goals addressed during session: ADL's and self-care      AM-PAC PT "6 Clicks" Mobility   Outcome Measure  Help needed turning from your back to  your side while in a flat bed without using bedrails?: A Little Help needed moving from lying on your back to sitting on the side of a flat bed without using bedrails?: A Little Help needed moving to and from a bed to a chair (including a wheelchair)?: A Little Help needed standing up from a chair using your arms (e.g., wheelchair or bedside chair)?: A Little Help needed to walk in hospital room?: A Little Help needed climbing 3-5 steps with a railing? : A Lot 6 Click Score: 17    End of Session   Activity Tolerance: Patient tolerated treatment well;Patient limited by fatigue Patient left: with call bell/phone within reach;in bed;with chair alarm set(bed alarm not working, chair alarm placed under pt as it was when PT arrived to room) Nurse Communication: Mobility status PT Visit Diagnosis: Unsteadiness on feet (R26.81);Other abnormalities of gait and mobility (R26.89)     Time: [id number] PT Time Calculation (min) (ACUTE ONLY): 30 min  Charges:  $Gait Training: 8-22 mins                     Lumen Brinlee E, PT Acute Rehabilitation Services Pager [phone]  Office [phone]  Noma Quijas D Elonda Husky 05/17/2020, 1:53 PM

## 2020-05-18 LAB — CBC
HCT: 30.9 % — ABNORMAL LOW (ref 39.0–52.0)
Hemoglobin: 9.9 g/dL — ABNORMAL LOW (ref 13.0–17.0)
MCH: 28.9 pg (ref 26.0–34.0)
MCHC: 32 g/dL (ref 30.0–36.0)
MCV: 90.4 fL (ref 80.0–100.0)
Platelets: 389 10*3/uL (ref 150–400)
RBC: 3.42 MIL/uL — ABNORMAL LOW (ref 4.22–5.81)
RDW: 17.1 % — ABNORMAL HIGH (ref 11.5–15.5)
WBC: 6.4 10*3/uL (ref 4.0–10.5)
nRBC: 0 % (ref 0.0–0.2)

## 2020-05-18 LAB — BASIC METABOLIC PANEL
Anion gap: 8 (ref 5–15)
BUN: 16 mg/dL (ref 8–23)
CO2: 25 mmol/L (ref 22–32)
Calcium: 8.5 mg/dL — ABNORMAL LOW (ref 8.9–10.3)
Chloride: 107 mmol/L (ref 98–111)
Creatinine, Ser: 0.89 mg/dL (ref 0.61–1.24)
GFR calc Af Amer: >60 mL/min (ref >60)
GFR calc non Af Amer: >60 mL/min (ref >60)
Glucose, Bld: 94 mg/dL (ref 70–99)
Potassium: 4.2 mmol/L (ref 3.5–5.1)
Sodium: 140 mmol/L (ref 135–145)

## 2020-05-18 LAB — MAGNESIUM: Magnesium: 1.9 mg/dL (ref 1.7–2.4)

## 2020-05-18 MED ORDER — MAGNESIUM SULFATE 2 GM/50ML IV SOLN
2.0000 g | Freq: Once | INTRAVENOUS | Status: AC
  Administered 2020-05-18: 2 g via INTRAVENOUS
  Filled 2020-05-18: qty 50

## 2020-05-18 NOTE — Progress Notes (Signed)
PROGRESS NOTE        PATIENT DETAILS Name: Kenneth Sullivan Age: 64 y.o. Sex: male Date of Birth: September 04, 1956 Admit Date: 03/07/2020 Admitting Physician Marshell Garfinkel, MD GYB:[id number], [name]  Brief Narrative: 64 year old with no significant past medical history.  Had acute shortness of breath with sats in the 40s on EMS arrival.  He had a witnessed bradycardic, asystolic arrest when EMS was present with 3 rounds of epi, 20 minutes to ROSC. Underwent normothermia protocol.  Work-up significant for massive PE, DVT with RV strain-unfortunately for the hospital course complicated by upper GI bleeding due to gastric ulcer.  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  Significant events: 3/23>> Admit, EGD with findings of oozing gastric ulcer with clot, IR for mechanical thrombectomy, IVC filter and GDA embolization 4/5>>Trach 4/28>>trach decannulated  Significant studies: CTA 3/23 >> bilateral PE with RV/with ratio 1.9, emphysema CT head 3/23 >> chronic microvascular changes.  No acute findings Echo 3/23 >> RV is severely dilated with reduced function and D-shaped septum, LVEF 60-65%. Venogram 3/23>> occlusive DVT of the iliac vein extending to the lower IVC LE Venous Duplex 3/24 >> acute DVT in BLE up to the femoral vein  MRI Brain 3/28 >> multifocal acute to subacute ischemic infarcts involving bilateral cerebral hemispheres, findings consistent with global hypoperfusion event related to cardiac arrest, associated scattered petechial hemorrhage about many areas of ischemia, evidence of hemorrhagic conversion in the right parietal lobe  Antimicrobial therapy: Doxycycline: 4/9>> 4/17  Microbiology  data: 3/23: Blood culture>> negative so far 4/10: Tracheal aspirate culture>> normal respiratory flora 4/10: Blood culture>> no growth 4/27: Urine culture>> Proteus mirabilis 3/23:Sars COV2 3 >> negative  Surgical pathology: 5/19 >>liver biopsy: Neuroendocrine tumor 5/28>> gastric ulcer biopsy by EGD-adenocarcinoma  Procedures : 3/23-3/24>> LTM EEG: No seizures-moderate to severe diffuse encephalopathy 3/23>> spot EEG: No seizures, severe to profound diffuse encephalopathy 3/23>> placement of retrievable IVC filter, pulmonary angiogram and mechanical/aspiration thrombectomy 3/23 >>EGD >> large gastric ulcer 3/23 >>embolization of right gastric artery/gastroduodenal artery 3/23 by IR>> 3/23 >>4/5 ETT 4/5>>Trach >>changed to 6 cuffless 4/22 >> decannulated 4/28 5/19>> ultrasound-guided liver biopsy 5/28>> EGD with biopsy of gastric ulcer-adenocarcinoma  Consults: CCM, IR, neurology, cardiology, oncology, [facility]  DVT Prophylaxis : SCD's  Subjective: Lying comfortably in bed-pleasantly confused-asking me for some "money".  Assessment/Plan: Acute hypoxic respiratory failure in the setting of PE/cardiac arrest: Managed in the ICU-s/p tracheotomy-subsequently decannulated-currently stable on room air.  Cardiac arrest: Secondary to massive PE-continue telemetry monitoring.  Patient is not a long-term anticoagulation candidate given large gastric ulcer-secondary to neuroendocrine tumor-given risk of massive GI bleeding.  Massive PE with DVT- s/p IVC filter: Not anticoagulation candidate-given malignant gastric ulcer with high risk potential for bleeding.  Is s/p IVC filter placement and mechanical thrombectomy.  Anoxic encephalopathy: Pleasantly confused-able to answer simple questions.  Upper GI bleeding secondary to large malignant gastric ulcer with acute blood loss anemia: Underwent EGD x2-and subsequently embolization of GDA by IR.  No further bleeding apparent-on  PPI.  Hemoglobin stable. EGD with biopsy showing adenocarcinoma.  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  Long discussion with oncology-Dr. Benay Spice on 5/27 -we both agree that it is probably best in this current condition to pursue palliative/hospice care. Family aware of our recommendation-family discussion in progress. No further inpatient work-up recommended by oncology-if patient is discharged to a facility here in town-patient will follow with oncology clinic at the cancer center-if discharged to a facility out of town-we will need local oncology follow-up.  Dysphagia: Secondary to anoxia-SLP following.  Delusions/hallucinations: Psych consulted-currently relatively stable-ICU delirium likely a component-continue antipsychotics.  Bedside sitter in place.   Nutrition Problem: Nutrition Problem: Severe Malnutrition Etiology: chronic illness(gastric lesion, multiple liver lesions, concern for metastatic disease) Signs/Symptoms: severe fat depletion, severe muscle depletion Interventions: Ensure Enlive (each supplement provides 350kcal and 20 grams of protein), Magic cup   Diet: Diet Order            Diet regular Room service appropriate? Yes; Fluid consistency: Thin  Diet effective now               Code Status:  DNR  Family Communication: Spoke with niece [name] [phone]-IP 5/31-we will update over the next few days.  Disposition Plan: Status is: Inpatient  Remains inpatient appropriate because:Inpatient level of care appropriate due to severity of illness  Dispo: The patient is from: Home              Anticipated d/c is to: SNF              Anticipated d/c date is: 1 day              Patient currently is not medically stable to d/c.   Barriers to Discharge: SNF bed  pending  Antimicrobial agents: Anti-infectives (From admission, onward)   Start     Dose/Rate Route Frequency Ordered Stop   03/26/20 1100  doxycycline (VIBRAMYCIN) 100 mg in sodium chloride 0.9 % 250 mL IVPB  Status:  Discontinued     100 mg 125 mL/hr over 120 Minutes Intravenous 2 times daily 03/26/20 1002 04/01/20 1035   03/25/20 1100  doxycycline (VIBRA-TABS) tablet 100 mg  Status:  Discontinued     100 mg Per Tube Every 12 hours 03/25/20 1009 03/26/20 1002   03/25/20 1015  doxycycline (VIBRAMYCIN) 100 mg in sodium chloride 0.9 % 250 mL IVPB  Status:  Discontinued    Note to Pharmacy: To be given after the tracheal aspirate is collected.   100 mg 125 mL/hr over 120 Minutes Intravenous Every 12 hours 03/25/20 1005 03/25/20 1008       Time spent: 15- minutes-Greater than 50% of this time was spent in counseling, explanation of diagnosis, planning of further management, and coordination of care.  MEDICATIONS: Scheduled Meds: . chlorhexidine gluconate (MEDLINE KIT)  15 mL Mouth Rinse BID  . clonazePAM  0.5 mg Oral BID  . feeding supplement (ENSURE ENLIVE)  237 mL Oral QID  . FLUoxetine  10 mg Oral QHS  . OXcarbazepine  75 mg Oral BID  . pantoprazole  40 mg Oral BID  . polyethylene glycol  17 g Oral Daily  . QUEtiapine  75 mg Oral BID   Continuous Infusions: . sodium chloride    . magnesium sulfate bolus IVPB 2 g (05/18/20 1057)   PRN Meds:.sodium chloride, acetaminophen, docusate sodium, guaiFENesin-dextromethorphan, hydrALAZINE, ipratropium-albuterol, LORazepam, oxyCODONE, sodium chloride flush   PHYSICAL EXAM: Vital signs: Vitals:   05/17/20 2100 05/18/20 3825 05/18/20 0840 05/18/20 0840  BP: 98/75 104/81 119/78 115/85  Pulse: (!) 101 98 96 91  Resp:  16 16   Temp: 98.6 F (37 C) 98.1 F (36.7 C) 98.1 F (36.7 C) 98.1 F (36.7 C)  TempSrc: Oral  Oral   SpO2: 97% 100% 99% 98%  Weight:      Height:       Filed Weights   04/29/20 1557 04/30/20 0500 05/01/20  0648  Weight: 63 kg 63.9 kg 63 kg   Body mass index is 17.85 kg/m.   Gen Exam: Confused but not in any distress. HEENT:atraumatic, normocephalic Chest: B/L clear to auscultation anteriorly CVS:S1S2 regular Abdomen:soft non tender, non distended Extremities:no edema Neurology: Non focal Skin: no rash  I have personally reviewed following labs and imaging studies  LABORATORY DATA: CBC: Recent Labs  Lab 05/12/20 0310 05/15/20 0352 05/18/20 0334  WBC 6.9 7.7 6.4  HGB 10.0* 10.7* 9.9*  HCT 31.2* 33.3* 30.9*  MCV 90.4 90.2 90.4  PLT 531* 500* 174    Basic Metabolic Panel: Recent Labs  Lab 05/12/20 0310 05/15/20 0352 05/18/20 0334  NA 140 140 140  K 3.9 3.8 4.2  CL 108 106 107  CO2 27 23 25   GLUCOSE 98 88 94  BUN 13 11 16   CREATININE 0.84 0.84 0.89  CALCIUM 8.8* 8.6* 8.5*  MG  --   --  1.9    GFR: Estimated Creatinine Clearance: 76.8 mL/min (by C-G formula based on SCr of 0.89 mg/dL).  Liver Function Tests: No results for input(s): AST, ALT, ALKPHOS, BILITOT, PROT, ALBUMIN in the last 168 hours. No results for input(s): LIPASE, AMYLASE in the last 168 hours. No results for input(s): AMMONIA in the last 168 hours.  Coagulation Profile: No results for input(s): INR, PROTIME in the last 168 hours.  Cardiac Enzymes: No results for input(s): CKTOTAL, CKMB, CKMBINDEX, TROPONINI in the last 168 hours.  BNP (last 3 results) No results for input(s): PROBNP in the last 8760 hours.  Lipid Profile: No results for input(s): CHOL, HDL, LDLCALC, TRIG, CHOLHDL, LDLDIRECT in the last 72 hours.  Thyroid Function Tests: No results for input(s): TSH, T4TOTAL, FREET4, T3FREE, THYROIDAB in the last 72 hours.  Anemia Panel: No results for input(s): VITAMINB12, FOLATE, FERRITIN, TIBC, IRON, RETICCTPCT in the last 72 hours.  Urine analysis:    Component Value Date/Time   COLORURINE AMBER (A) 04/11/2020 0153   APPEARANCEUR CLOUDY (A) 04/11/2020 0153   LABSPEC 1.024  04/11/2020 0153   PHURINE 7.0 04/11/2020 0153   GLUCOSEU NEGATIVE 04/11/2020 0153   HGBUR SMALL (A) 04/11/2020 0153   BILIRUBINUR NEGATIVE 04/11/2020 0153   KETONESUR NEGATIVE 04/11/2020 0153   PROTEINUR 100 (A) 04/11/2020 0153   NITRITE NEGATIVE 04/11/2020 0153   LEUKOCYTESUR LARGE (A) 04/11/2020 0153    Sepsis Labs: Lactic Acid, Venous    Component Value Date/Time   LATICACIDVEN 1.0 03/09/2020 0959    MICROBIOLOGY: Recent Results (from the past 240 hour(s))  SARS Coronavirus 2 by RT PCR (hospital order, performed in [facility] hospital lab) Nasopharyngeal Nasopharyngeal Swab     Status: None   Collection Time: 05/12/20 12:01 PM   Specimen: Nasopharyngeal Swab  Result Value Ref Range Status   SARS Coronavirus 2 NEGATIVE NEGATIVE Final    Comment: (NOTE) SARS-CoV-2 target nucleic acids are NOT DETECTED. The SARS-CoV-2 RNA is generally detectable in upper and lower respiratory specimens during the acute phase of infection. The lowest concentration of SARS-CoV-2 viral copies this assay can detect is 250 copies /  mL. A negative result does not preclude SARS-CoV-2 infection and should not be used as the sole basis for treatment or other patient management decisions.  A negative result may occur with improper specimen collection / handling, submission of specimen other than nasopharyngeal swab, presence of viral mutation(s) within the areas targeted by this assay, and inadequate number of viral copies (<250 copies / mL). A negative result must be combined with clinical observations, patient history, and epidemiological information. Fact Sheet for Patients:   StrictlyIdeas.no Fact Sheet for Healthcare Providers: BankingDealers.co.za This test is not yet approved or cleared  by the [place] FDA and has been authorized for detection and/or diagnosis of SARS-CoV-2 by FDA under an Emergency Use Authorization (EUA).  This EUA will  remain in effect (meaning this test can be used) for the duration of the COVID-19 declaration under Section 564(b)(1) of the Act, 21 U.S.C. section 360bbb-3(b)(1), unless the authorization is terminated or revoked sooner. Performed at [facility], [place] [address], [place], [place] [address]     RADIOLOGY STUDIES/RESULTS: No results found.   LOS: 72 days   Oren Binet, MD  [facility]    To contact the attending provider between 7A-7P or the covering provider during after hours 7P-7A, please log into the web site [email or web address] and access using universal [place] password for that web site. If you do not have the password, please call the hospital operator.  05/18/2020, 11:49 AM

## 2020-05-19 LAB — MAGNESIUM: Magnesium: 2 mg/dL (ref 1.7–2.4)

## 2020-05-19 LAB — GLUCOSE, CAPILLARY: Glucose-Capillary: 116 mg/dL — ABNORMAL HIGH (ref 70–99)

## 2020-05-19 LAB — SURGICAL PATHOLOGY

## 2020-05-19 MED ORDER — QUETIAPINE FUMARATE 100 MG PO TABS
100.0000 mg | ORAL_TABLET | Freq: Every day | ORAL | Status: DC
  Administered 2020-05-19 – 2020-06-19 (×32): 100 mg via ORAL
  Filled 2020-05-19 (×32): qty 1

## 2020-05-19 MED ORDER — QUETIAPINE FUMARATE 50 MG PO TABS
75.0000 mg | ORAL_TABLET | Freq: Every day | ORAL | Status: DC
  Administered 2020-05-20 – 2020-06-19 (×31): 75 mg via ORAL
  Filled 2020-05-19 (×31): qty 1

## 2020-05-19 NOTE — Progress Notes (Signed)
PROGRESS NOTE        PATIENT DETAILS Name: Kenneth Sullivan Age: 64 y.o. Sex: male Date of Birth: [birth year] Admit Date: 03/07/2020 Admitting Physician Marshell Garfinkel, MD XIP:[id number], [name]  Brief Narrative: 64 year old with no significant past medical history.  Had acute shortness of breath with sats in the 40s on EMS arrival.  He had a witnessed bradycardic, asystolic arrest when EMS was present with 3 rounds of epi, 20 minutes to ROSC. Underwent normothermia protocol.  Work-up significant for massive PE, DVT with RV strain-unfortunately for the hospital course complicated by upper GI bleeding due to gastric ulcer.  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  Significant events: 3/23>> Admit, EGD with findings of oozing gastric ulcer with clot, IR for mechanical thrombectomy, IVC filter and GDA embolization 4/5>>Trach 4/28>>trach decannulated  Significant studies: CTA 3/23 >> bilateral PE with RV/with ratio 1.9, emphysema CT head 3/23 >> chronic microvascular changes.  No acute findings Echo 3/23 >> RV is severely dilated with reduced function and D-shaped septum, LVEF 60-65%. Venogram 3/23>> occlusive DVT of the iliac vein extending to the lower IVC LE Venous Duplex 3/24 >> acute DVT in BLE up to the femoral vein  MRI Brain 3/28 >> multifocal acute to subacute ischemic infarcts involving bilateral cerebral hemispheres, findings consistent with global hypoperfusion event related to cardiac arrest, associated scattered petechial hemorrhage about many areas of ischemia, evidence of hemorrhagic conversion in the right parietal lobe  Antimicrobial therapy: Doxycycline: 4/9>> 4/17  Microbiology  data: 3/23: Blood culture>> negative so far 4/10: Tracheal aspirate culture>> normal respiratory flora 4/10: Blood culture>> no growth 4/27: Urine culture>> Proteus mirabilis 3/23:Sars COV2 3 >> negative  Surgical pathology: 5/19 >>liver biopsy: Neuroendocrine tumor 5/28>> gastric ulcer biopsy by EGD-adenocarcinoma  Procedures : 3/23-3/24>> LTM EEG: No seizures-moderate to severe diffuse encephalopathy 3/23>> spot EEG: No seizures, severe to profound diffuse encephalopathy 3/23>> placement of retrievable IVC filter, pulmonary angiogram and mechanical/aspiration thrombectomy 3/23 >>EGD >> large gastric ulcer 3/23 >>embolization of right gastric artery/gastroduodenal artery 3/23 by IR>> 3/23 >>4/5 ETT 4/5>>Trach >>changed to 6 cuffless 4/22 >> decannulated 4/28 5/19>> ultrasound-guided liver biopsy 5/28>> EGD with biopsy of gastric ulcer-adenocarcinoma  Consults: CCM, IR, neurology, cardiology, oncology, [facility]  DVT Prophylaxis : SCD's  Subjective: Seen earlier this morning-hungry-asking for breakfast.  Per RN-no major issues overnight-he remains confused-awaiting SNF bed.  Assessment/Plan: Acute hypoxic respiratory failure in the setting of PE/cardiac arrest: Managed in the ICU-s/p tracheotomy-subsequently decannulated-currently stable on room air.  Cardiac arrest: Secondary to massive PE-continue telemetry monitoring.  Patient is not a long-term anticoagulation candidate given large gastric ulcer-secondary to neuroendocrine tumor-given risk of massive GI bleeding.  Massive PE with DVT- s/p IVC filter: Not anticoagulation candidate-given malignant gastric ulcer with high risk potential for bleeding.  Is s/p IVC filter placement and mechanical thrombectomy.  Anoxic encephalopathy: Pleasantly confused-able to answer simple questions.  Upper GI bleeding secondary to large malignant gastric ulcer with acute blood loss anemia: Underwent EGD x2-and subsequently  embolization of GDA by IR.  No further bleeding apparent-on PPI.  Hemoglobin stable. EGD with biopsy showing adenocarcinoma.  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  Long discussion with oncology-Dr. Benay Spice on 5/27 -we both agree that it is probably best in this current condition to pursue palliative/hospice care. Family aware of our recommendation-family discussion in progress. No further inpatient work-up recommended by oncology-if patient is discharged to a facility here in town-patient will follow with oncology clinic at the cancer center-if discharged to a facility out of town-we will need local oncology follow-up.  Dysphagia: Secondary to anoxia-SLP following.  Delusions/hallucinations: Psych consulted-at times he gets agitated-but for the most part he is confused but pleasant.  Twelve-lead EKG 6/4 AM with normal QTC-continue antipsychotics.  Nutrition Problem: Nutrition Problem: Severe Malnutrition Etiology: chronic illness(gastric lesion, multiple liver lesions, concern for metastatic disease) Signs/Symptoms: severe fat depletion, severe muscle depletion Interventions: Ensure Enlive (each supplement provides 350kcal and 20 grams of protein), Magic cup   Diet: Diet Order            Diet regular Room service appropriate? No; Fluid consistency: Thin  Diet effective now               Code Status:  DNR  Family Communication: Spoke with niece [name] [id number] 6/3  Disposition Plan: Status is: Inpatient  Remains inpatient appropriate because:Inpatient level of care appropriate due to severity of illness  Dispo: The patient is from: Home              Anticipated d/c is to: SNF              Anticipated d/c date is: 1 day              Patient currently is  medically  stable to d/c.   Barriers to Discharge: SNF bed pending  Antimicrobial agents: Anti-infectives (From admission, onward)   Start     Dose/Rate Route Frequency Ordered Stop   03/26/20 1100  doxycycline (VIBRAMYCIN) 100 mg in sodium chloride 0.9 % 250 mL IVPB  Status:  Discontinued     100 mg 125 mL/hr over 120 Minutes Intravenous 2 times daily 03/26/20 1002 04/01/20 1035   03/25/20 1100  doxycycline (VIBRA-TABS) tablet 100 mg  Status:  Discontinued     100 mg Per Tube Every 12 hours 03/25/20 1009 03/26/20 1002   03/25/20 1015  doxycycline (VIBRAMYCIN) 100 mg in sodium chloride 0.9 % 250 mL IVPB  Status:  Discontinued    Note to Pharmacy: To be given after the tracheal aspirate is collected.   100 mg 125 mL/hr over 120 Minutes Intravenous Every 12 hours 03/25/20 1005 03/25/20 1008       Time spent: 15- minutes-Greater than 50% of this time was spent in counseling, explanation of diagnosis, planning of further management, and coordination of care.  MEDICATIONS: Scheduled Meds: . clonazePAM  0.5 mg Oral BID  . feeding supplement (ENSURE ENLIVE)  237 mL Oral QID  . FLUoxetine  10 mg Oral QHS  . OXcarbazepine  75 mg Oral BID  . pantoprazole  40 mg Oral BID  . polyethylene glycol  17 g Oral Daily  . QUEtiapine  75 mg Oral BID   Continuous Infusions: . sodium chloride     PRN Meds:.sodium chloride, acetaminophen, docusate sodium, guaiFENesin-dextromethorphan, hydrALAZINE, ipratropium-albuterol, LORazepam, oxyCODONE, sodium chloride flush   PHYSICAL EXAM: Vital signs: Vitals:   05/18/20 0840 05/18/20 1638 05/18/20 2110 05/19/20 0736  BP: 115/85 (!) 88/74 123/85 97/71  Pulse: 91 92 99 96  Resp:  16 18 18   Temp: 98.1 F (36.7 C) (!) 97.5 F (36.4 C) 98.5 F (36.9 C) 98.6 F (37 C)  TempSrc:   Oral   SpO2: 98% 95% 99% 100%  Weight:      Height:       Filed Weights   04/29/20 1557 04/30/20 0500 05/01/20 0648  Weight: 63 kg 63.9 kg 63 kg   Body mass index is 17.85 kg/m.    Gen Exam: Confused but not in any distress. HEENT:atraumatic, normocephalic Chest: B/L clear to auscultation anteriorly CVS:S1S2 regular Abdomen:soft non tender, non distended Extremities:no edema Neurology: Non focal Skin: no rash  I have personally reviewed following labs and imaging studies  LABORATORY DATA: CBC: Recent Labs  Lab 05/15/20 0352 05/18/20 0334  WBC 7.7 6.4  HGB 10.7* 9.9*  HCT 33.3* 30.9*  MCV 90.2 90.4  PLT 500* 539    Basic Metabolic Panel: Recent Labs  Lab 05/15/20 0352 05/18/20 0334 05/19/20 0418  NA 140 140  --   K 3.8 4.2  --   CL 106 107  --   CO2 23 25  --   GLUCOSE 88 94  --   BUN 11 16  --   CREATININE 0.84 0.89  --   CALCIUM 8.6* 8.5*  --   MG  --  1.9 2.0    GFR: Estimated Creatinine Clearance: 76.8 mL/min (by C-G formula based on SCr of 0.89 mg/dL).  Liver Function Tests: No results for input(s): AST, ALT, ALKPHOS, BILITOT, PROT, ALBUMIN in the last 168 hours. No results for input(s): LIPASE, AMYLASE in the last 168 hours. No results for input(s): AMMONIA in the last 168 hours.  Coagulation Profile: No results for input(s): INR, PROTIME in the last 168 hours.  Cardiac Enzymes: No results for input(s): CKTOTAL, CKMB, CKMBINDEX, TROPONINI in the last 168 hours.  BNP (last 3 results) No results for input(s): PROBNP in the last 8760 hours.  Lipid Profile: No results for input(s): CHOL, HDL, LDLCALC, TRIG, CHOLHDL, LDLDIRECT in the last 72 hours.  Thyroid Function Tests: No results for input(s): TSH, T4TOTAL, FREET4, T3FREE, THYROIDAB in the last 72 hours.  Anemia Panel: No results for input(s): VITAMINB12, FOLATE, FERRITIN, TIBC, IRON, RETICCTPCT in the last 72 hours.  Urine analysis:    Component Value Date/Time   COLORURINE AMBER (A) 04/11/2020 0153   APPEARANCEUR CLOUDY (A) 04/11/2020 0153   LABSPEC 1.024 04/11/2020 0153   PHURINE 7.0 04/11/2020 0153   GLUCOSEU NEGATIVE 04/11/2020 0153   HGBUR SMALL (A)  04/11/2020 0153   BILIRUBINUR NEGATIVE 04/11/2020 0153   KETONESUR NEGATIVE 04/11/2020 0153   PROTEINUR 100 (A) 04/11/2020 0153   NITRITE NEGATIVE 04/11/2020 0153   LEUKOCYTESUR LARGE (A) 04/11/2020 0153    Sepsis Labs: Lactic Acid, Venous    Component Value Date/Time   LATICACIDVEN 1.0 03/09/2020 0959    MICROBIOLOGY: Recent Results (from the past 240 hour(s))  SARS Coronavirus 2 by RT PCR (hospital order, performed in [place] hospital lab) Nasopharyngeal Nasopharyngeal Swab     Status: None   Collection Time: 05/12/20 12:01 PM   Specimen: Nasopharyngeal Swab  Result Value Ref Range Status   SARS Coronavirus 2 NEGATIVE NEGATIVE Final    Comment: (NOTE) SARS-CoV-2 target nucleic acids are NOT DETECTED. The SARS-CoV-2 RNA is generally detectable in upper and lower respiratory specimens during the acute phase of infection. The lowest concentration of SARS-CoV-2 viral copies this assay can detect is 250 copies / mL. A negative result does  not preclude SARS-CoV-2 infection and should not be used as the sole basis for treatment or other patient management decisions.  A negative result may occur with improper specimen collection / handling, submission of specimen other than nasopharyngeal swab, presence of viral mutation(s) within the areas targeted by this assay, and inadequate number of viral copies (<250 copies / mL). A negative result must be combined with clinical observations, patient history, and epidemiological information. Fact Sheet for Patients:   StrictlyIdeas.no Fact Sheet for Healthcare Providers: BankingDealers.co.za This test is not yet approved or cleared  by the [place] FDA and has been authorized for detection and/or diagnosis of SARS-CoV-2 by FDA under an Emergency Use Authorization (EUA).  This EUA will remain in effect (meaning this test can be used) for the duration of the COVID-19 declaration under  Section 564(b)(1) of the Act, 21 U.S.C. section 360bbb-3(b)(1), unless the authorization is terminated or revoked sooner. Performed at [facility], [place] [address], [place], [place] [address]     RADIOLOGY STUDIES/RESULTS: No results found.   LOS: 15 days   Oren Binet, MD  [facility]    To contact the attending provider between 7A-7P or the covering provider during after hours 7P-7A, please log into the web site [email or web address] and access using universal [place] password for that web site. If you do not have the password, please call the hospital operator.  05/19/2020, 2:15 PM

## 2020-05-20 NOTE — Progress Notes (Signed)
[facility]  PROGRESS NOTE  Kenneth Sullivan WUX:[id number] DOB: April 12, 1956 DOA: 03/07/2020 PCP: Medicine, [name]   Brief HPI:   64 year old male with no significant medical history presented with shortness of breath with O2 sats in 4s.  He had witnessed bradycardic asystolic arrest when EMS was present with 3 rounds of epi, 20 minutes to ROSC.  Underwent normothermia protocol.  Work-up was significant for massive pulmonary embolism, DVT with right ventricular strain.  Patient had upper GI bleed during hospitalization due to gastric ulcer.  CT of the abdomen/pelvis showed large antral mass and multiple liver lesions-liver biopsy confirmed high-grade neuroendocrine tumor, subsequent EGD with biopsy revealed adenocarcinoma.  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    Subjective   Patient seen and examined, no new complaints.  Awaiting bed at skilled nursing facility.   Assessment/Plan:     1. Acute hypoxic respiratory failure-in setting of PE/cardiac arrest.  Managed in the ICU, s/p tracheostomy, subsequently decannulated.  Patient was transferred to medical floor.  Currently stable on room air. 2. S/p cardiac arrest-secondary to massive pulmonary embolism.  Patient is not a long-term anticoagulation candidate given large gastric ulcer.  Patient found to have neuroendocrine tumor. 3. Massive pulmonary medicine with DVT-s/p IVC filter, patient is not anticoagulation candidate.  Due to history of bleeding gastric ulcer with high risk potential for bleeding.  Patient is s/p IVC filter placement and mechanical thrombectomy. 4. Anoxic encephalopathy-stable, pleasantly confused. 5. Upper GI bleeding-secondary to large malignant gastric ulcer with acute blood loss anemia.  Patient underwent EGD x2 and subsequent embolization of gastroduodenal artery by IR.  No further bleeding episodes.  Currently maintained on  PPI.  Hemoglobin is stable.  EGD with biopsy showed adenocarcinoma. 6. Gastric adenocarcinoma with neuroendocrine component-patient has metastatic stasis to liver.  Oncology was consulted, Dr. Sloan Leiter discussed with oncology and plan was to pursue palliative/hospice care.  No further inpatient work-up recommended.  Patient's family is aware of this recommendation.  Patient can follow-up with oncology as outpatient.  If patient gets discharged in local skilled facility he can follow-up with oncology at cancer center.  If he gets discharged to skilled facility in a different city, then he will need local oncology follow-up over there. 7. Dysphagia-secondary to anoxia-speech therapy following. 8. Delusions/hallucinations-psych was consulted, continue to psychotics. 9. Nutrition-patient has severe malnutrition due to chronic illness from gastric lesion, multiple liver lesions.  Started on [employer], Magic cup.    SpO2: 96 % O2 Flow Rate (L/min): 2 L/min FiO2 (%): 66 %   COVID-19 Labs  No results for input(s): DDIMER, FERRITIN, LDH, CRP in the last 72 hours.  Lab Results  Component Value Date   SARSCOV2NAA NEGATIVE 05/12/2020   [place] NEGATIVE 03/07/2020     CBG: Recent Labs  Lab 05/13/20 1205 05/13/20 1653 05/13/20 2049 05/14/20 0722 05/19/20 0733  GLUCAP 96 127* 109* 98 116*    CBC: Recent Labs  Lab 05/15/20 0352 05/18/20 0334  WBC 7.7 6.4  HGB 10.7* 9.9*  HCT 33.3* 30.9*  MCV 90.2 90.4  PLT 500* 253    Basic Metabolic Panel: Recent Labs  Lab 05/15/20 0352 05/18/20 0334 05/19/20 0418  NA 140 140  --   K 3.8 4.2  --   CL 106 107  --   CO2 23 25  --   GLUCOSE 88 94  --   BUN 11 16  --   CREATININE 0.84 0.89  --  CALCIUM 8.6* 8.5*  --   MG  --  1.9 2.0     DVT prophylaxis: SCDs  Code Status: Full code  Family Communication: No family at bedside    Status is: Inpatient  Dispo: The patient is from: Home              Anticipated d/c is to:  Skilled nursing facility              Anticipated d/c date is: 05/22/2020              Patient currently medically stable for discharge  Barrier to discharge-pending bed availability at skilled nursing facility        Scheduled medications:  . clonazePAM  0.5 mg Oral BID  . feeding supplement (ENSURE ENLIVE)  237 mL Oral QID  . FLUoxetine  10 mg Oral QHS  . OXcarbazepine  75 mg Oral BID  . pantoprazole  40 mg Oral BID  . polyethylene glycol  17 g Oral Daily  . QUEtiapine  100 mg Oral QHS  . QUEtiapine  75 mg Oral Daily    Consultants:  CCM  IR  Neurology  Cardiology  Oncology  [facility] gastroenterology  Procedures: 3/23-3/24>> LTM EEG: No seizures-moderate to severe diffuse encephalopathy 3/23>> spot EEG: No seizures, severe to profound diffuse encephalopathy 3/23>> placement of retrievable IVC filter, pulmonary angiogram and mechanical/aspiration thrombectomy 3/23 >>EGD >> large gastric ulcer 3/23 >>embolization of right gastric artery/gastroduodenal artery 3/23 by IR>> 3/23 >>4/5 ETT 4/5>>Trach >>changed to 6 cuffless 4/22 >> decannulated 4/28 5/19>> ultrasound-guided liver biopsy 5/28>> EGD with biopsy of gastric ulcer-adenocarcinoma  Significant events 3/23-Admit, EGD with finding of oozing gastric ulcer with clot.  IR for mechanical thrombectomy, IVC filter and GDA embolization 03/20/2020-tracheostomy 04/12/2020-tracheostomy decannulated  Significant studies CTA 3/23 >> bilateral PE with RV/with ratio 1.9, emphysema CT head 3/23 >>chronic microvascular changes. No acute findings Echo 3/23 >> RV is severely dilated with reduced function and D-shaped septum, LVEF 60-65%. Venogram 3/23>> occlusive DVT of the iliac vein extending to the lower IVC LE Venous Duplex 3/24 >>acute DVT in BLE up to the femoral vein  MRI Brain 3/28 >> multifocal acute to subacute ischemic infarcts involving bilateral cerebral hemispheres, findings consistent with global hypoperfusion  event related to cardiac arrest, associated scattered petechial hemorrhage about many areas of ischemia, evidence of hemorrhagic conversion in the right parietal lobe  Antimicrobial therapy Doxycycline-03/24/20 to 03/1720  Microbiology data 3/23: Blood culture>> negative so far 4/10: Tracheal aspirate culture>> normal respiratory flora 4/10: Blood culture>> no growth 4/27: Urine culture>> Proteus mirabilis 3/23:Sars COV2 3 >> negative  Surgical pathology 3/23: Blood culture>> negative so far 4/10: Tracheal aspirate culture>> normal respiratory flora 4/10: Blood culture>> no growth 4/27: Urine culture>> Proteus mirabilis 3/23:Sars COV2 3 >> negative    Antibiotics:   Anti-infectives (From admission, onward)   Start     Dose/Rate Route Frequency Ordered Stop   03/26/20 1100  doxycycline (VIBRAMYCIN) 100 mg in sodium chloride 0.9 % 250 mL IVPB  Status:  Discontinued     100 mg 125 mL/hr over 120 Minutes Intravenous 2 times daily 03/26/20 1002 04/01/20 1035   03/25/20 1100  doxycycline (VIBRA-TABS) tablet 100 mg  Status:  Discontinued     100 mg Per Tube Every 12 hours 03/25/20 1009 03/26/20 1002   03/25/20 1015  doxycycline (VIBRAMYCIN) 100 mg in sodium chloride 0.9 % 250 mL IVPB  Status:  Discontinued    Note to Pharmacy: To be given  after the tracheal aspirate is collected.   100 mg 125 mL/hr over 120 Minutes Intravenous Every 12 hours 03/25/20 1005 03/25/20 1008       Objective   Vitals:   05/18/20 2110 05/19/20 0736 05/19/20 2120 05/20/20 0744  BP: 123/85 97/71 110/82 98/67  Pulse: 99 96  94  Resp: 18 18 19 16   Temp: 98.5 F (36.9 C) 98.6 F (37 C) 97.8 F (36.6 C) 98.1 F (36.7 C)  TempSrc: Oral  Oral   SpO2: 99% 100% 99% 96%  Weight:      Height:       No intake or output data in the 24 hours ending 05/20/20 1054  06/03 1901 - 06/05 0700 In: 480 [P.O.:480] Out: -   Filed Weights   04/29/20 1557 04/30/20 0500 05/01/20 0648  Weight: 63 kg 63.9 kg 63 kg     Physical Examination:    General: Appears in no acute distress, pleasantly confused  Cardiovascular: S1-S2, regular, no murmur auscultated  Respiratory: Clear to auscultation bilaterally, no wheezing or crackles auscultated  Abdomen: Abdomen is soft, nontender, no organomegaly  Extremities: No edema in the lower extremities noted  Neurologic: Moving all extremities, no focal deficit noted    Data Reviewed:   Recent Results (from the past 240 hour(s))  SARS Coronavirus 2 by RT PCR (hospital order, performed in [place] hospital lab) Nasopharyngeal Nasopharyngeal Swab     Status: None   Collection Time: 05/12/20 12:01 PM   Specimen: Nasopharyngeal Swab  Result Value Ref Range Status   SARS Coronavirus 2 NEGATIVE NEGATIVE Final    Comment: (NOTE) SARS-CoV-2 target nucleic acids are NOT DETECTED. The SARS-CoV-2 RNA is generally detectable in upper and lower respiratory specimens during the acute phase of infection. The lowest concentration of SARS-CoV-2 viral copies this assay can detect is 250 copies / mL. A negative result does not preclude SARS-CoV-2 infection and should not be used as the sole basis for treatment or other patient management decisions.  A negative result may occur with improper specimen collection / handling, submission of specimen other than nasopharyngeal swab, presence of viral mutation(s) within the areas targeted by this assay, and inadequate number of viral copies (<250 copies / mL). A negative result must be combined with clinical observations, patient history, and epidemiological information. Fact Sheet for Patients:   StrictlyIdeas.no Fact Sheet for Healthcare Providers: BankingDealers.co.za This test is not yet approved or cleared  by the [place] FDA and has been authorized for detection and/or diagnosis of SARS-CoV-2 by FDA under an Emergency Use Authorization (EUA).  This EUA will  remain in effect (meaning this test can be used) for the duration of the COVID-19 declaration under Section 564(b)(1) of the Act, 21 U.S.C. section 360bbb-3(b)(1), unless the authorization is terminated or revoked sooner. Performed at [facility], [place] [address] [place], [place] [address]     BNP (last 3 results) Recent Labs    03/07/20 0710  BNP 74.6    ProBNP (last 3 results) No results for input(s): PROBNP in the last 8760 hours.  Studies:  No results found.     Oswald Hillock   [facility] If 7PM-7AM, please contact night-coverage at [email or web address], Office  [phone]   05/20/2020, 10:54 AM  LOS: 74 days

## 2020-05-21 NOTE — Progress Notes (Addendum)
Daily Progress Note   Patient Name: Kenneth Sullivan       Date: 05/21/2020 DOB: 1956/08/07  Age: 64 y.o. MRN#: [id number] Attending Physician: Thurnell Lose, MD Primary Care Physician: Medicine, [name] Admit Date: 03/07/2020  Reason for Consultation/Follow-up: Establishing goals of care  Subjective: Evaluated patient- he was in bed, complaining that he was hungry. Per nursing he is able to ambulate, get up to toilet, eating well.  Called patient's niece- her main priority is getting patient to a facility close to her where she can see him. She cannot travel to [place].  Reviewed ongoing aggressive medical care vs comfort care and rehospitalization in light of his cancer diagnosis- not likely to be treatable.  Discussed option of Hospice.  Ms. Suzie Portela requested a few days to pray about her decision.   Review of Systems  Unable to perform ROS: Dementia    Length of Stay: 75  Current Medications: Scheduled Meds:  . clonazePAM  0.5 mg Oral BID  . feeding supplement (ENSURE ENLIVE)  237 mL Oral QID  . FLUoxetine  10 mg Oral QHS  . OXcarbazepine  75 mg Oral BID  . pantoprazole  40 mg Oral BID  . polyethylene glycol  17 g Oral Daily  . QUEtiapine  100 mg Oral QHS  . QUEtiapine  75 mg Oral Daily    Continuous Infusions: . sodium chloride      PRN Meds: sodium chloride, acetaminophen, docusate sodium, guaiFENesin-dextromethorphan, hydrALAZINE, ipratropium-albuterol, LORazepam, oxyCODONE, sodium chloride flush  Physical Exam Vitals and nursing note reviewed.  Constitutional:      Comments: think  Neurological:     Mental Status: He is disoriented.  Psychiatric:     Comments: Calm, cooperative              Vital Signs: BP 100/73 (BP Location: Left Arm)    Pulse 86   Temp 98.3 F (36.8 C)   Resp 17   Ht 6\' 2"  (1.88 m)   Wt 63 kg   SpO2 98%   BMI 17.85 kg/m  SpO2: SpO2: 98 % O2 Device: O2 Device: Room Air O2 Flow Rate: O2 Flow Rate (L/min): 2 L/min  Intake/output summary:   Intake/Output Summary (Last 24 hours) at 05/21/2020 1556 Last data filed at 05/21/2020 0200 Gross per 24 hour  Intake 120 ml  Output --  Net 120 ml   LBM: Last BM Date: 05/18/20 Baseline Weight: Weight: 72.6 kg Most recent weight: Weight: (this was suppose to be done on nights.)       Palliative Assessment/Data: PPS: 40%   Flowsheet Rows     Most Recent Value  Intake Tab  Referral Department  Critical care  Unit at Time of Referral  ICU  Palliative Care Primary Diagnosis  Pulmonary  Date Notified  03/20/20  Palliative Care Type  New Palliative care  Reason for referral  Clarify Goals of Care  Date of Admission  03/07/20  Date first seen by Palliative Care  03/20/20  # of days Palliative referral response time  0 Day(s)  # of days IP prior to Palliative referral  13  Clinical Assessment  Psychosocial & Spiritual Assessment  Palliative Care Outcomes      Patient Active Problem List   Diagnosis Date Noted  . Protein-calorie malnutrition, severe 05/08/2020  . Status post tracheostomy ([place])   . Palliative care by specialist   . Goals of care, counseling/discussion   . DNR (do not resuscitate)   . Pulmonary emboli ([place])   . 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 Acute respiratory failure ([place])   . Anoxic encephalopathy ([place])   . Cardiac arrest ([place]) 03/07/2020  . GI bleed 03/07/2020  . Encounter for central line placement     Palliative Care Assessment & Plan   Patient Profile: 64 year old with no significant past medical history. Had acute shortness of breath with sats in the 40s on EMS arrival. He had a witnessed bradycardic, asystolic arrest when EMS was present with 3 rounds of epi, 20 minutes to ROSC. Underwent  normothermia protocol. Work-up significant for massive PE, DVT with RV strain, GI bleed from gastric ulcer. 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   Assessment/Recommendations/Plan   Patient's niece very focused on patient discharging to SNF close to her home  She requested a period of time to pray regarding decisions of comfort care and Hospice- asked that PMT contact her again on Tuesday- will honor her request  Code Status:  DNR  Prognosis:   < 6 months  Discharge Planning:  To Be Determined  Care plan was discussed with patient's niece- Ms. Suzie Portela.   Thank you for allowing the Palliative Medicine Team to assist in the care of this patient.   Time In: 1530 Time Out: 1610 Total Time 40 mins Prolonged Time Billed  No      Greater than 50%  of this time was spent counseling and coordinating care related to the above assessment and plan.  Mariana Kaufman, AGNP-C Palliative Medicine   Please contact Palliative Medicine Team phone at [phone] for questions and concerns.

## 2020-05-21 NOTE — Progress Notes (Addendum)
PROGRESS NOTE        PATIENT DETAILS Name: Kenneth Sullivan Age: 64 y.o. Sex: male Date of Birth: 1956/02/14 Admit Date: 03/07/2020 Admitting Physician Marshell Garfinkel, MD KWI:[id number], [name]  Brief Narrative: 64 year old with no significant past medical history.  Had acute shortness of breath with sats in the 40s on EMS arrival.  He had a witnessed bradycardic, asystolic arrest when EMS was present with 3 rounds of epi, 20 minutes to ROSC. Underwent normothermia protocol.  Work-up significant for massive PE, DVT with RV strain-unfortunately for the hospital course complicated by upper GI bleeding due to gastric ulcer.  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  Significant events: 3/23>> Admit, EGD with findings of oozing gastric ulcer with clot, IR for mechanical thrombectomy, IVC filter and GDA embolization 4/5>>Trach 4/28>>trach decannulated  Significant studies: CTA 3/23 >> bilateral PE with RV/with ratio 1.9, emphysema CT head 3/23 >> chronic microvascular changes.  No acute findings Echo 3/23 >> RV is severely dilated with reduced function and D-shaped septum, LVEF 60-65%. Venogram 3/23>> occlusive DVT of the iliac vein extending to the lower IVC LE Venous Duplex 3/24 >> acute DVT in BLE up to the femoral vein  MRI Brain 3/28 >> multifocal acute to subacute ischemic infarcts involving bilateral cerebral hemispheres, findings consistent with global hypoperfusion event related to cardiac arrest, associated scattered petechial hemorrhage about many areas of ischemia, evidence of hemorrhagic conversion in the right parietal lobe  Antimicrobial therapy: Doxycycline: 4/9>> 4/17  Microbiology  data: 3/23: Blood culture>> negative so far 4/10: Tracheal aspirate culture>> normal respiratory flora 4/10: Blood culture>> no growth 4/27: Urine culture>> Proteus mirabilis 3/23:Sars COV2 3 >> negative  Surgical pathology: 5/19 >>liver biopsy: Neuroendocrine tumor 5/28>> gastric ulcer biopsy by EGD-adenocarcinoma  Procedures : 3/23-3/24>> LTM EEG: No seizures-moderate to severe diffuse encephalopathy 3/23>> spot EEG: No seizures, severe to profound diffuse encephalopathy 3/23>> placement of retrievable IVC filter, pulmonary angiogram and mechanical/aspiration thrombectomy 3/23 >>EGD >> large gastric ulcer 3/23 >>embolization of right gastric artery/gastroduodenal artery 3/23 by IR>> 3/23 >>4/5 ETT 4/5>>Trach >>changed to 6 cuffless 4/22 >> decannulated 4/28 5/19>> ultrasound-guided liver biopsy 5/28>> EGD with biopsy of gastric ulcer-adenocarcinoma  Consults: CCM, IR, neurology, cardiology, oncology, [facility], palliative care for goals of care consulted on 05/21/2020  DVT Prophylaxis : SCD's  Subjective:  Patient in bed, appears comfortable, denies any headache, no fever, no chest pain or pressure, no shortness of breath , no abdominal pain. No focal weakness.   Assessment/Plan:  Acute hypoxic respiratory failure in the setting of PE/cardiac arrest: Managed in the ICU-s/p tracheotomy-subsequently decannulated-currently stable on room air.  Cardiac arrest: Secondary to massive PE-continue telemetry monitoring.  Patient is not a long-term anticoagulation candidate given large gastric ulcer-secondary to neuroendocrine tumor-given risk of massive GI bleeding.  Massive PE with DVT- s/p IVC filter: Not anticoagulation candidate-given malignant gastric ulcer with high risk potential for bleeding.  Is s/p IVC filter placement and mechanical thrombectomy.  Anoxic encephalopathy: Pleasantly confused-able to answer simple questions.  Upper GI bleeding secondary to large  malignant gastric ulcer with acute blood loss anemia: Underwent EGD x2-and subsequently embolization of GDA by IR.  No further bleeding apparent-on PPI.  Hemoglobin stable. EGD with biopsy showing adenocarcinoma.  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  Long discussion with oncology-Dr. Benay Spice on 5/27 -we both agree that it is probably best in this current condition to pursue palliative/hospice care. Family aware of our recommendation-family discussion in progress, will involve palliative care for definitive goals of care discussion with patient and family.  Per oncology follow with oncology outpatient in 2 to 3 weeks post discharge once goals of care are established.  Dysphagia: Secondary to anoxia-SLP following.  Delusions/hallucinations: Psych consulted-at times he gets agitated-but for the most part currently appears stable.  Twelve-lead EKG 6/4 AM with normal QTC-continue antipsychotics.  Nutrition Problem: Nutrition Problem: Severe Malnutrition Etiology: chronic illness(gastric lesion, multiple liver lesions, concern for metastatic disease) Signs/Symptoms: severe fat depletion, severe muscle depletion Interventions: Ensure Enlive (each supplement provides 350kcal and 20 grams of protein), Magic cup   Diet: Diet Order            Diet regular Room service appropriate? No; Fluid consistency: Thin  Diet effective now               Code Status:  DNR  Family Communication: Spoke with niece [name] [id number] 6/3  Disposition Plan: Status is: Inpatient  Remains inpatient appropriate because:Inpatient level of care appropriate due to severity of illness  Dispo: The patient is from: Home              Anticipated d/c is to: SNF              Anticipated d/c date is: 1 day               Patient currently is  medically stable to d/c.   Barriers to Discharge: SNF bed pending  Antimicrobial agents: Anti-infectives (From admission, onward)   Start     Dose/Rate Route Frequency Ordered Stop   03/26/20 1100  doxycycline (VIBRAMYCIN) 100 mg in sodium chloride 0.9 % 250 mL IVPB  Status:  Discontinued     100 mg 125 mL/hr over 120 Minutes Intravenous 2 times daily 03/26/20 1002 04/01/20 1035   03/25/20 1100  doxycycline (VIBRA-TABS) tablet 100 mg  Status:  Discontinued     100 mg Per Tube Every 12 hours 03/25/20 1009 03/26/20 1002   03/25/20 1015  doxycycline (VIBRAMYCIN) 100 mg in sodium chloride 0.9 % 250 mL IVPB  Status:  Discontinued    Note to Pharmacy: To be given after the tracheal aspirate is collected.   100 mg 125 mL/hr over 120 Minutes Intravenous Every 12 hours 03/25/20 1005 03/25/20 1008       Time spent: 15- minutes-Greater than 50% of this time was spent in counseling, explanation of diagnosis, planning of further management, and coordination of care.  MEDICATIONS: Scheduled Meds: . clonazePAM  0.5 mg Oral BID  . feeding supplement (ENSURE ENLIVE)  237 mL Oral QID  . FLUoxetine  10 mg Oral QHS  . OXcarbazepine  75 mg Oral BID  . pantoprazole  40 mg Oral BID  . polyethylene glycol  17 g Oral Daily  . QUEtiapine  100 mg Oral QHS  . QUEtiapine  75 mg Oral Daily   Continuous Infusions: . sodium chloride     PRN Meds:.sodium chloride, acetaminophen, docusate sodium, guaiFENesin-dextromethorphan, hydrALAZINE, ipratropium-albuterol, LORazepam, oxyCODONE, sodium chloride flush   PHYSICAL EXAM: Vital signs:  Vitals:   05/20/20 0744 05/20/20 1629 05/21/20 0339 05/21/20 0746  BP: 98/67 103/78 118/77 100/73  Pulse: 94 94 86 86  Resp: 16 18 18 17   Temp: 98.1 F (36.7 C) 98.6 F (37 C) (!) 97.4 F (36.3 C) 98.3 F (36.8 C)  TempSrc:   Oral   SpO2: 96% 99% 98% 98%  Weight:      Height:       Filed Weights   04/29/20 1557 04/30/20 0500  05/01/20 0648  Weight: 63 kg 63.9 kg 63 kg   Body mass index is 17.85 kg/m.   Gen Exam:   Awake Alert, No new F.N deficits, Normal affect [place].AT,PERRAL Supple Neck,No JVD, No cervical lymphadenopathy appriciated.  Symmetrical Chest wall movement, Good air movement bilaterally, CTAB RRR,No Gallops, Rubs or new Murmurs, No Parasternal Heave +ve B.Sounds, Abd Soft, No tenderness, No organomegaly appriciated, No rebound - guarding or rigidity. No Cyanosis, Clubbing or edema, No new Rash or bruise   I have personally reviewed following labs and imaging studies  LABORATORY DATA: CBC: Recent Labs  Lab 05/15/20 0352 05/18/20 0334  WBC 7.7 6.4  HGB 10.7* 9.9*  HCT 33.3* 30.9*  MCV 90.2 90.4  PLT 500* 341    Basic Metabolic Panel: Recent Labs  Lab 05/15/20 0352 05/18/20 0334 05/19/20 0418  NA 140 140  --   K 3.8 4.2  --   CL 106 107  --   CO2 23 25  --   GLUCOSE 88 94  --   BUN 11 16  --   CREATININE 0.84 0.89  --   CALCIUM 8.6* 8.5*  --   MG  --  1.9 2.0    GFR: Estimated Creatinine Clearance: 76.8 mL/min (by C-G formula based on SCr of 0.89 mg/dL).  Liver Function Tests: No results for input(s): AST, ALT, ALKPHOS, BILITOT, PROT, ALBUMIN in the last 168 hours. No results for input(s): LIPASE, AMYLASE in the last 168 hours. No results for input(s): AMMONIA in the last 168 hours.  Coagulation Profile: No results for input(s): INR, PROTIME in the last 168 hours.  Cardiac Enzymes: No results for input(s): CKTOTAL, CKMB, CKMBINDEX, TROPONINI in the last 168 hours.  BNP (last 3 results) No results for input(s): PROBNP in the last 8760 hours.  Lipid Profile: No results for input(s): CHOL, HDL, LDLCALC, TRIG, CHOLHDL, LDLDIRECT in the last 72 hours.  Thyroid Function Tests: No results for input(s): TSH, T4TOTAL, FREET4, T3FREE, THYROIDAB in the last 72 hours.  Anemia Panel: No results for input(s): VITAMINB12, FOLATE, FERRITIN, TIBC, IRON, RETICCTPCT in the last  72 hours.  Urine analysis:    Component Value Date/Time   COLORURINE AMBER (A) 04/11/2020 0153   APPEARANCEUR CLOUDY (A) 04/11/2020 0153   LABSPEC 1.024 04/11/2020 0153   PHURINE 7.0 04/11/2020 0153   GLUCOSEU NEGATIVE 04/11/2020 0153   HGBUR SMALL (A) 04/11/2020 0153   BILIRUBINUR NEGATIVE 04/11/2020 0153   KETONESUR NEGATIVE 04/11/2020 0153   PROTEINUR 100 (A) 04/11/2020 0153   NITRITE NEGATIVE 04/11/2020 0153   LEUKOCYTESUR LARGE (A) 04/11/2020 0153    Sepsis Labs: Lactic Acid, Venous    Component Value Date/Time   LATICACIDVEN 1.0 03/09/2020 0959    MICROBIOLOGY: Recent Results (from the past 240 hour(s))  SARS Coronavirus 2 by RT PCR (hospital order, performed in [place] hospital lab) Nasopharyngeal Nasopharyngeal Swab     Status: None   Collection Time: 05/12/20 12:01 PM   Specimen: Nasopharyngeal Swab  Result Value Ref Range Status  SARS Coronavirus 2 NEGATIVE NEGATIVE Final    Comment: (NOTE) SARS-CoV-2 target nucleic acids are NOT DETECTED. The SARS-CoV-2 RNA is generally detectable in upper and lower respiratory specimens during the acute phase of infection. The lowest concentration of SARS-CoV-2 viral copies this assay can detect is 250 copies / mL. A negative result does not preclude SARS-CoV-2 infection and should not be used as the sole basis for treatment or other patient management decisions.  A negative result may occur with improper specimen collection / handling, submission of specimen other than nasopharyngeal swab, presence of viral mutation(s) within the areas targeted by this assay, and inadequate number of viral copies (<250 copies / mL). A negative result must be combined with clinical observations, patient history, and epidemiological information. Fact Sheet for Patients:   StrictlyIdeas.no Fact Sheet for Healthcare Providers: BankingDealers.co.za This test is not yet approved or cleared  by  the [place] FDA and has been authorized for detection and/or diagnosis of SARS-CoV-2 by FDA under an Emergency Use Authorization (EUA).  This EUA will remain in effect (meaning this test can be used) for the duration of the COVID-19 declaration under Section 564(b)(1) of the Act, 21 U.S.C. section 360bbb-3(b)(1), unless the authorization is terminated or revoked sooner. Performed at [facility], [place] [address], [place], [place] [address]     RADIOLOGY STUDIES/RESULTS: No results found.   LOS: 75 days   Signature  Lala Lund M.D on 05/21/2020 at 10:06 AM   -  To page go to [email or web address]     05/21/2020, 10:06 AM

## 2020-05-22 LAB — CBC
HCT: 28.7 % — ABNORMAL LOW (ref 39.0–52.0)
Hemoglobin: 9.2 g/dL — ABNORMAL LOW (ref 13.0–17.0)
MCH: 28.6 pg (ref 26.0–34.0)
MCHC: 32.1 g/dL (ref 30.0–36.0)
MCV: 89.1 fL (ref 80.0–100.0)
Platelets: 350 10*3/uL (ref 150–400)
RBC: 3.22 MIL/uL — ABNORMAL LOW (ref 4.22–5.81)
RDW: 17.1 % — ABNORMAL HIGH (ref 11.5–15.5)
WBC: 6.7 10*3/uL (ref 4.0–10.5)
nRBC: 0 % (ref 0.0–0.2)

## 2020-05-22 LAB — COMPREHENSIVE METABOLIC PANEL
ALT: 12 U/L (ref 0–44)
AST: 16 U/L (ref 15–41)
Albumin: 2.3 g/dL — ABNORMAL LOW (ref 3.5–5.0)
Alkaline Phosphatase: 60 U/L (ref 38–126)
Anion gap: 6 (ref 5–15)
BUN: 15 mg/dL (ref 8–23)
CO2: 25 mmol/L (ref 22–32)
Calcium: 8.5 mg/dL — ABNORMAL LOW (ref 8.9–10.3)
Chloride: 108 mmol/L (ref 98–111)
Creatinine, Ser: 0.9 mg/dL (ref 0.61–1.24)
GFR calc Af Amer: >60 mL/min (ref >60)
GFR calc non Af Amer: >60 mL/min (ref >60)
Glucose, Bld: 91 mg/dL (ref 70–99)
Potassium: 4 mmol/L (ref 3.5–5.1)
Sodium: 139 mmol/L (ref 135–145)
Total Bilirubin: 0.2 mg/dL — ABNORMAL LOW (ref 0.3–1.2)
Total Protein: 5 g/dL — ABNORMAL LOW (ref 6.5–8.1)

## 2020-05-22 LAB — MAGNESIUM: Magnesium: 1.8 mg/dL (ref 1.7–2.4)

## 2020-05-22 NOTE — Progress Notes (Signed)
Occupational Therapy Treatment Patient Details Name: Kenneth Sullivan MRN: [id number] DOB: 08/25/1956 Today's Date: 05/22/2020    History of present illness Pt is 64 yo male with unknown medical hx.  He presented to ED on 3/23 with acute SHOB.  He had bradycardiac event that lead to asystole/cardiac arrest (ROSC achieved in 20 mins with 3 rounds epinephrine).  Pt was intubated and admitted to ICU.  Pt with massive PE/DVT and RV strain.  He underwent IR guided mechanical thrombectomy and IVC Filter.   Pt developed gastric ulcer and required GDA embolization.  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 Pt s/p trach on 4/5 and changed to cuffless on 4/22--decannualted 4/28   OT comments  Pt requesting to eat breakfast upon arrival. Pt required modA for feeding with therapist guiding pt's hand while holding utensil to scoop food. Pt required minA+2 for functional mobility at RW level. Pt with visual limitations impacting safety and independence with ADL/IADL and functional mobility, unable to formally assess secondary to cognition and decreased attention to task. Pt will continue to benefit from skilled OT services to maximize safety and independence with ADL/IADL and functional mobility. Will continue to follow acutely and progress as tolerated.    Follow Up Recommendations  SNF;Supervision/Assistance - 24 hour    Equipment Recommendations  Other (comment)(TBD)    Recommendations for Other Services      Precautions / Restrictions Precautions Precautions: Fall;Other (comment) Precaution Comments: impulsive, easily agitated Restrictions Weight Bearing Restrictions: No       Mobility Bed Mobility Overal bed mobility: Needs Assistance Bed Mobility:  Supine to Sit     Supine to sit: Min assist;HOB elevated     General bed mobility comments: minA for cues to progress to EOB;minA to scoot hips to EOB  Transfers Overall transfer level: Needs assistance Equipment used: Rolling walker (2 wheeled) Transfers: Sit to/from Stand Sit to Stand: Min assist;+2 physical assistance;+2 safety/equipment         General transfer comment: cues for hand placement/sequencing, assist to power up and stabilize RW.    Balance Overall balance assessment: Needs assistance Sitting-balance support: No upper extremity supported;Feet supported Sitting balance-Leahy Scale: Good     Standing balance support: Bilateral upper extremity supported;During functional activity Standing balance-Leahy Scale: Poor Standing balance comment: reliant on external support                           ADL either performed or assessed with clinical judgement   ADL Overall ADL's : Needs assistance/impaired Eating/Feeding: Sitting;Moderate assistance Eating/Feeding Details (indicate cue type and reason): modA to guide utensil due to visual acuity and cognition, pt easily frustrated while feeding;able to maintain grasp on cup and bring to mouth for use of straw                     Toilet Transfer: Minimal assistance;Ambulation Toilet Transfer Details (indicate cue type and reason): minA+2 with use of RW;assistance for navigating due to visual limitations Toileting- Clothing Manipulation and Hygiene: Moderate assistance;Sit to/from stand       Functional mobility during ADLs: Cueing for sequencing;Cueing for safety;Minimal assistance;+2 for physical assistance;+2 for safety/equipment;Rolling walker General ADL Comments: minA+2 for mobility with assist for navigation and use of RW      Vision  Vision Assessment?: Vision impaired- to be further tested in functional context Additional Comments: did not formally assess;with with left  inattention;reports he is unable to clearly see and make out items;pt compensating using fingers to touch items;unable to follow commands for formal testing   Perception     Praxis      Cognition Arousal/Alertness: Awake/alert Behavior During Therapy: [facility] for tasks assessed/performed Overall Cognitive Status: Impaired/Different from baseline Area of Impairment: Orientation;Attention;Following commands;Safety/judgement;Awareness;Problem solving;Memory                 Orientation Level: Disoriented to;Time;Situation;Place Current Attention Level: Sustained Memory: Decreased short-term memory Following Commands: Follows one step commands inconsistently Safety/Judgement: Decreased awareness of deficits;Decreased awareness of safety Awareness: Intellectual Problem Solving: Difficulty sequencing;Requires tactile cues;Requires verbal cues;Decreased initiation;Slow processing General Comments: Cues to stay on task. Easily distracted. Primarily focusing on food. Wanting staff to get him cookies and a hamburger (at 1030am).        Exercises     Shoulder Instructions       General Comments vss    Pertinent Vitals/ Pain       Pain Assessment: No/denies pain Pain Intervention(s): Monitored during session  Home Living                                          Prior Functioning/Environment              Frequency  Min 2X/week        Progress Toward Goals  OT Goals(current goals can now be found in the care plan section)  Progress towards OT goals: Progressing toward goals  Acute Rehab OT Goals Patient Stated Goal: to get a cookie OT Goal Formulation: Patient unable to participate in goal setting Time For Goal Achievement: 06/05/20 Potential to Achieve Goals: Fair ADL Goals Pt Will Perform Eating: with min assist;sitting Pt Will Perform Grooming: with min assist;standing Pt Will Perform Upper Body Bathing: with min assist;sitting Pt Will Perform  Lower Body Bathing: with min assist;sit to/from stand Pt Will Perform Upper Body Dressing: with min assist;sitting Pt Will Perform Lower Body Dressing: with min assist;sit to/from stand Pt Will Transfer to Toilet: with min guard assist;ambulating;regular height toilet;grab bars Pt Will Perform Toileting - Clothing Manipulation and hygiene: with min guard assist;sit to/from stand  Plan Discharge plan remains appropriate    Co-evaluation    PT/OT/SLP Co-Evaluation/Treatment: Yes Reason for Co-Treatment: For patient/therapist safety;To address functional/ADL transfers PT goals addressed during session: Mobility/safety with mobility;Balance;Proper use of DME OT goals addressed during session: ADL's and self-care      AM-PAC OT "6 Clicks" Daily Activity     Outcome Measure   Help from another person eating meals?: A Lot Help from another person taking care of personal grooming?: A Lot Help from another person toileting, which includes using toliet, bedpan, or urinal?: A Lot Help from another person bathing (including washing, rinsing, drying)?: A Lot Help from another person to put on and taking off regular upper body clothing?: A Lot Help from another person to put on and taking off regular lower body clothing?: A Lot 6 Click Score: 12    End of Session Equipment Utilized During Treatment: Gait belt;Rolling walker  OT Visit Diagnosis: Unsteadiness on feet (R26.81);Other abnormalities of gait and mobility (R26.89);Muscle weakness (generalized) (M62.81);Low vision, both eyes (H54.2);Other symptoms and signs involving cognitive function   Activity Tolerance Patient tolerated  treatment well   Patient Left with call bell/phone within reach;in chair;with chair alarm set   Nurse Communication Mobility status        Time: 3601-6580 OT Time Calculation (min): 31 min  Charges: OT General Charges $OT Visit: 1 Visit OT Treatments $Self Care/Home Management : 8-22 mins  Helene Kelp  OTR/L Acute Rehabilitation Services Office: [place] 05/22/2020, 1:17 PM

## 2020-05-22 NOTE — Progress Notes (Signed)
Physical Therapy Treatment Patient Details Name: Kenneth Sullivan MRN: [id number] DOB: [birth year] Today's Date: 05/22/2020    History of Present Illness Pt is 64 yo male with unknown medical hx.  He presented to ED on 3/23 with acute SHOB.  He had bradycardiac event that lead to asystole/cardiac arrest (ROSC achieved in 20 mins with 3 rounds epinephrine).  Pt was intubated and admitted to ICU.  Pt with massive PE/DVT and RV strain.  He underwent IR guided mechanical thrombectomy and IVC Filter.   Pt developed gastric ulcer and required GDA embolization.  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 Pt s/p trach on 4/5 and changed to cuffless on 4/22--decannualted 4/28    PT Comments    Pt asleep on arrival, easily awoken. Agreeable to participation in therapy. He required min assist bed mobility, +2 min assist transfers, and  +2 min assist ambulation 100' with RW. Pt easily distracted, requiring continuous cues to stay on task. Pt in recliner with feet elevated at end of session. Pt with double chair alarms, chair pad as well as alarm belt.   Follow Up Recommendations  SNF     Equipment Recommendations  Rolling walker with 5" wheels    Recommendations for Other Services       Precautions / Restrictions Precautions Precautions: Fall;Other (comment) Precaution Comments: impulsive, easily agitated    Mobility  Bed Mobility Overal bed mobility: Needs Assistance       Supine to sit: Min assist;HOB elevated        Transfers Overall transfer level: Needs assistance Equipment used: Rolling walker (2 wheeled) Transfers: Sit to/from Stand Sit to Stand: Min assist;+2 physical assistance;+2 safety/equipment         General transfer comment:  cues for hand placement/sequencing, assist to power up and stabilize RW.  Ambulation/Gait Ambulation/Gait assistance: Min assist;+2 safety/equipment;+2 physical assistance Gait Distance (Feet): 100 Feet Assistive device: Rolling walker (2 wheeled) Gait Pattern/deviations: Step-through pattern;Decreased stride length Gait velocity: decreased Gait velocity interpretation: 1.31 - 2.62 ft/sec, indicative of limited community ambulator General Gait Details: assist to stabilize balance and manage RW. Cues to stay close to RW. Chair follow for safety.   Stairs             Wheelchair Mobility    Modified Rankin (Stroke Patients Only)       Balance Overall balance assessment: Needs assistance Sitting-balance support: No upper extremity supported;Feet supported Sitting balance-Leahy Scale: Good     Standing balance support: Bilateral upper extremity supported;During functional activity Standing balance-Leahy Scale: Poor Standing balance comment: reliant on external support                            Cognition Arousal/Alertness: Awake/alert Behavior During Therapy: WFL for tasks assessed/performed Overall Cognitive Status: Impaired/Different from baseline Area of Impairment: Orientation;Attention;Following commands;Safety/judgement;Awareness;Problem solving;Memory                 Orientation Level: Disoriented to;Time;Situation;Place Current Attention Level: Sustained Memory: Decreased short-term memory Following Commands: Follows one step commands inconsistently Safety/Judgement: Decreased awareness of deficits;Decreased awareness of safety Awareness: Intellectual Problem Solving: Difficulty sequencing;Requires tactile cues;Requires verbal cues;Decreased initiation;Slow processing General Comments: Cues to stay on task. Easily distracted. Primarily focusing on food. Wanting staff to get him cookies and a hamburger (at 1030).  Exercises       General Comments        Pertinent Vitals/Pain Pain Assessment: No/denies pain    Home Living                      Prior Function            PT Goals (current goals can now be found in the care plan section) Acute Rehab PT Goals Patient Stated Goal: not stated PT Goal Formulation: With patient Time For Goal Achievement: 06/02/20 Potential to Achieve Goals: Fair Progress towards PT goals: Progressing toward goals    Frequency    Min 2X/week      PT Plan Current plan remains appropriate    Co-evaluation PT/OT/SLP Co-Evaluation/Treatment: Yes Reason for Co-Treatment: For patient/therapist safety;To address functional/ADL transfers;Necessary to address cognition/behavior during functional activity PT goals addressed during session: Mobility/safety with mobility;Balance;Proper use of DME        AM-PAC PT "6 Clicks" Mobility   Outcome Measure  Help needed turning from your back to your side while in a flat bed without using bedrails?: A Little Help needed moving from lying on your back to sitting on the side of a flat bed without using bedrails?: A Little Help needed moving to and from a bed to a chair (including a wheelchair)?: A Little Help needed standing up from a chair using your arms (e.g., wheelchair or bedside chair)?: A Little Help needed to walk in hospital room?: A Little Help needed climbing 3-5 steps with a railing? : A Lot 6 Click Score: 17    End of Session Equipment Utilized During Treatment: Gait belt Activity Tolerance: Patient tolerated treatment well Patient left: in chair;with call bell/phone within reach;with chair alarm set(chair alarm pad and alarm belt) Nurse Communication: Mobility status PT Visit Diagnosis: Unsteadiness on feet (R26.81);Other abnormalities of gait and mobility (R26.89)     Time: 1011-1040 PT Time Calculation (min) (ACUTE ONLY): 29 min  Charges:  $Gait Training: 8-22 mins                     Lorrin Goodell,  PT  Office # [phone] Pager [phone]    Lorriane Shire 05/22/2020, 12:32 PM

## 2020-05-22 NOTE — Progress Notes (Addendum)
HEMATOLOGY-ONCOLOGY PROGRESS NOTE  SUBJECTIVE: Has no complaints today.  Nursing reports that he occasionally has conversations with people who are not actually present.  Awaiting SNF placement.  PHYSICAL EXAMINATION:  Vitals:   05/21/20 2159 05/22/20 0832  BP: 91/64 101/77  Pulse: 97 84  Resp: 17 16  Temp: 98 F (36.7 C) 98.2 F (36.8 C)  SpO2: 99% 99%   Filed Weights   04/29/20 1557 04/30/20 0500 05/01/20 0648  Weight: 63 kg 63.9 kg 63 kg    Intake/Output from previous day: 06/06 0701 - 06/07 0700 In: 240 [P.O.:240] Out: -   GENERAL:alert, no distress and comfortable ABDOMEN:abdomen soft, non-tender and normal bowel sounds  NEURO: Alert, confused at times and answered inappropriately at times  LABORATORY DATA:  I have reviewed the data as listed CMP Latest Ref Rng & Units 05/22/2020 05/18/2020 05/15/2020  Glucose 70 - 99 mg/dL 91 94 88  BUN 8 - 23 mg/dL _0 Creatinine 0.61 - 1.24 mg/dL 0.90 0.89 0.84  Sodium 135 - 145 mmol/L 139 140 140  Potassium 3.5 - 5.1 mmol/L 4.0 4.2 3.8  Chloride 98 - 111 mmol/L 108 107 106  CO2 22 - 32 mmol/L _1 Calcium 8.9 - 10.3 mg/dL 8.5(L) 8.5(L) 8.6(L)  Total Protein 6.5 - 8.1 g/dL 5.0(L) - -  Total Bilirubin 0.3 - 1.2 mg/dL 0.2(L) - -  Alkaline Phos 38 - 126 U/L 60 - -  AST 15 - 41 U/L 16 - -  ALT 0 - 44 U/L 12 - -    Lab Results  Component Value Date   WBC 6.7 05/22/2020   HGB 9.2 (L) 05/22/2020   HCT 28.7 (L) 05/22/2020   MCV 89.1 05/22/2020   PLT 350 05/22/2020   NEUTROABS 4.6 05/10/2020    CT ABDOMEN PELVIS W CONTRAST  Result Date: 05/01/2020 CLINICAL DATA:  Acute abdominal pain EXAM: CT ABDOMEN AND PELVIS WITH CONTRAST TECHNIQUE: Multidetector CT imaging of the abdomen and pelvis was performed using the standard protocol following bolus administration of intravenous contrast. CONTRAST:  133m OMNIPAQUE IOHEXOL 300 MG/ML  SOLN COMPARISON:  03/23/2020 FINDINGS: Lower chest: Trace dependent right pleural effusion.  Lung bases otherwise clear. Normal heart size. No pericardial effusion. No hiatal hernia. Hepatobiliary: New hypodense hepatic lesions bilaterally. Left hepatic dome lesion measures 5.4 cm. Peripheral right hepatic lesion measures 2.4 cm. Anterior right hepatic dome lesion measures 1 cm. These are all measured on image 12 series 3. Lesions are compatible with new hepatic metastases. Focal fat noted along the falciform ligament anteriorly as before. No intrahepatic biliary dilatation. Hepatic and portal veins are patent. Gallbladder collapsed. Pancreas: Unremarkable. No pancreatic ductal dilatation or surrounding inflammatory changes. Spleen: Overall small in size. No focal abnormality. No parenchymal lesion. Adrenals/Urinary Tract: Normal adrenal glands. No renal obstruction or hydronephrosis. Posterior left upper pole renal cyst measures 8 cm. No hydroureter or urinary tract calculus. Bladder under distended. Stomach/Bowel: Diffuse hypodense transmural wall thickening of the stomach antrum, image 24 series 3 and image 10 series 8 suspicious for a gastric antral mass, predominant along the posterior wall roughly measuring 7.1 x 3.3 cm, image 9 series 8. Proximal stomach is distended. Difficult to exclude some component of partial gastric outlet obstruction. There is air and contrast within the small intestine more distally. No small bowel obstruction pattern. No free air. No significant free fluid, ascites, hemorrhage, hematoma or abscess. Vascular/Lymphatic: Infrarenal and aortic bifurcation atherosclerosis without aneurysm or dissection. No occlusive process. Mesenteric and  renal vasculature appear patent. IVC filter noted. No bulky adenopathy. Reproductive: No significant finding by CT. Other: No inguinal or abdominal wall hernia. Musculoskeletal: Degenerative changes of the spine and lower lumbar facet arthropathy. 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 Gastric  primary is suspected. Consider GI consultation and repeat endoscopy. Aortic Atherosclerosis (ICD10-I70.0). These results will be called to the ordering clinician or representative by the Radiologist Assistant, and communication documented in the PACS or [employer]. Electronically Signed   By: Jerilynn Mages.  Shick M.D.   On: 05/01/2020 16:02   US BIOPSY (LIVER)  Result Date: 05/03/2020 INDICATION: 63 year old male with multiple liver lesions and concern for metastatic disease EXAM: ULTRASOUND-GUIDED BIOPSY LEFT LIVER MASS MEDICATIONS: None. ANESTHESIA/SEDATION: Moderate (conscious) sedation was employed during this procedure. A total of Versed 2.0 mg and Fentanyl 100 mcg was administered intravenously. Moderate Sedation Time: 10 minutes. The patient's level of consciousness and vital signs were monitored continuously by radiology nursing throughout the procedure under my direct supervision. FLUOROSCOPY TIME:  None). COMPLICATIONS: None PROCEDURE: Informed written consent was obtained from the patient after a thorough discussion of the procedural risks, benefits and alternatives. All questions were addressed. Maximal Sterile Barrier Technique was utilized including caps, mask, sterile gowns, sterile gloves, sterile drape, hand hygiene and skin antiseptic. A timeout was performed prior to the initiation of the procedure Ultrasound survey of the bilateral liver lobe performed with images stored and sent to PACs. The subxiphoid region of the abdomen was prepped with chlorhexidine in a sterile fashion, and a sterile drape was applied covering the operative field. A sterile gown and sterile gloves were used for the procedure. Local anesthesia was provided with 1% Lidocaine. Once the patient is prepped and draped sterilely and the skin and subcutaneous tissues were generously infiltrated with 1% lidocaine. A 17 gauge introducer needle was advanced into the left liver lobe targeting heterogeneously echoic mass of the left  liver. The stylet was removed, and multiple 18 gauge core biopsy were retrieved. Samples were placed into formalin for transportation to the lab. Gel-Foam pledgets were then infused with a small amount of saline for assistance with hemostasis. The needle was removed, and a final ultrasound image was performed. The patient tolerated the procedure well and remained hemodynamically stable throughout. No complications were encountered and no significant blood loss was encounter. IMPRESSION: Status post ultrasound-guided biopsy of left liver mass. Signed, Dulcy Fanny. Dellia Nims, RPVI [facility] Radiology Electronically Signed   By: Corrie Mckusick D.O.   On: 05/03/2020 16:16   DG Abd 2 Views  Result Date: 04/28/2020 CLINICAL DATA:  64 year old male with abdominal pain. History of GI bleeding treated with mesenteric embolization. EXAM: ABDOMEN - 2 VIEW COMPARISON:  Noncontrast CT Abdomen and Pelvis 03/23/2020. Abdominal radiographs 04/11/2020 and earlier. FINDINGS: Three supine views of the abdomen and pelvis. Negative lung bases. Enteric feeding tube removed from last month. Stable IVC filter and epigastric embolization coils. Non obstructed bowel gas pattern. Decreased retained stool in the large bowel from last month. No acute osseous abnormality identified. IMPRESSION: No acute findings. Non obstructed bowel gas pattern with decreased retained stool in the colon from last month. Electronically Signed   By: Genevie Ann M.D.   On: 04/28/2020 12:46    ASSESSMENT AND PLAN: 1.    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 2.  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  Tracheostomy placement on 03/20/2020, now healed 4.  Cardiac arrest on 03/07/2020 5.  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  History of acute blood loss anemia likely due to upper GI bleed 7.  Delusions/hallucinations  Mr. Vandergriff appears stable.  Biopsy result from EGD performed on 05/12/2020 showed adenocarcinoma with neuroendocrine component.  Pathology reports that prior liver lesion represents to assess of the neuroendocrine component of the gastric tumor.  Diagnosis and treatment options have been discussed with the patient and previously with his niece by telephone.  Would recommend treatment with FOLFOX.  The patient does not have a full understanding of his diagnosis.  He is awaiting SNF placement.  Family would like him closer to them in [place], [place].  Palliative care team is following and having conversations with family regarding aggressive treatment versus hospice.  Recommendations: 1.  Await final disposition plan.  If he stays in [place], we will set him up for outpatient follow-up at the cancer center.  If he does not stay local, we can refer him to an oncologist closer to where he will be staying. 2.  Await additional conversations by palliative care with the family regarding aggressive treatment which would consist of FOLFOX chemotherapy versus hospice.   LOS: 76 days   Mikey Bussing, [place], AGPCNP-BC, AOCNP 05/22/20 Mr. Asbridge appears unchanged.  The pathology from the gastric biopsy has returned consistent with adenocarcinoma with neuroendocrine features.  It appears the gastric tumor has metastasized to the liver where the neuroendocrine component predominates.  I recommend FOLFOX as  standard chemotherapy.  We will check PD1 and HER-2 testing. I think it will be difficult for Mr. Henricksen to be treated with systemic chemotherapy given his significant mental deficits.  He stated today that he plans to be discharged to a nursing facility close to his sister and niece.  We will arrange for oncology follow-up depending on the disposition plan.

## 2020-05-22 NOTE — Progress Notes (Signed)
PROGRESS NOTE        PATIENT DETAILS Name: Kenneth Sullivan Age: 64 y.o. Sex: male Date of Birth: October 07, 1956 Admit Date: 03/07/2020 Admitting Physician Marshell Garfinkel, MD UTM:[id number], [name]  Brief Narrative: 64 year old with no significant past medical history.  Had acute shortness of breath with sats in the 40s on EMS arrival.  He had a witnessed bradycardic, asystolic arrest when EMS was present with 3 rounds of epi, 20 minutes to ROSC. Underwent normothermia protocol.  Work-up significant for massive PE, DVT with RV strain-unfortunately for the hospital course complicated by upper GI bleeding due to gastric ulcer.  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  Significant events: 3/23>> Admit, EGD with findings of oozing gastric ulcer with clot, IR for mechanical thrombectomy, IVC filter and GDA embolization 4/5>>Trach 4/28>>trach decannulated  Significant studies: CTA 3/23 >> bilateral PE with RV/with ratio 1.9, emphysema CT head 3/23 >> chronic microvascular changes.  No acute findings Echo 3/23 >> RV is severely dilated with reduced function and D-shaped septum, LVEF 60-65%. Venogram 3/23>> occlusive DVT of the iliac vein extending to the lower IVC LE Venous Duplex 3/24 >> acute DVT in BLE up to the femoral vein  MRI Brain 3/28 >> multifocal acute to subacute ischemic infarcts involving bilateral cerebral hemispheres, findings consistent with global hypoperfusion event related to cardiac arrest, associated scattered petechial hemorrhage about many areas of ischemia, evidence of hemorrhagic conversion in the right parietal lobe  Antimicrobial therapy: Doxycycline: 4/9>> 4/17  Microbiology  data: 3/23: Blood culture>> negative so far 4/10: Tracheal aspirate culture>> normal respiratory flora 4/10: Blood culture>> no growth 4/27: Urine culture>> Proteus mirabilis 3/23:Sars COV2 3 >> negative  Surgical pathology: 5/19 >>liver biopsy: Neuroendocrine tumor 5/28>> gastric ulcer biopsy by EGD-adenocarcinoma  Procedures : 3/23-3/24>> LTM EEG: No seizures-moderate to severe diffuse encephalopathy 3/23>> spot EEG: No seizures, severe to profound diffuse encephalopathy 3/23>> placement of retrievable IVC filter, pulmonary angiogram and mechanical/aspiration thrombectomy 3/23 >>EGD >> large gastric ulcer 3/23 >>embolization of right gastric artery/gastroduodenal artery 3/23 by IR>> 3/23 >>4/5 ETT 4/5>>Trach >>changed to 6 cuffless 4/22 >> decannulated 4/28 5/19>> ultrasound-guided liver biopsy 5/28>> EGD with biopsy of gastric ulcer-adenocarcinoma  Consults: CCM, IR, neurology, cardiology, oncology, [facility], palliative care for goals of care consulted on 05/21/2020  DVT Prophylaxis : SCD's  Subjective:  Patient in bed, appears comfortable, denies any headache, no fever, no chest pain or pressure, no shortness of breath , no abdominal pain. No focal weakness.    Assessment/Plan:  Acute hypoxic respiratory failure in the setting of PE/cardiac arrest: Managed in the ICU-s/p tracheotomy-subsequently decannulated-currently stable on room air.  Cardiac arrest: Secondary to massive PE-continue telemetry monitoring.  Patient is not a long-term anticoagulation candidate given large gastric ulcer-secondary to neuroendocrine tumor-given risk of massive GI bleeding.  Massive PE with DVT- s/p IVC filter: Not anticoagulation candidate-given malignant gastric ulcer with high risk potential for bleeding.  Is s/p IVC filter placement and mechanical thrombectomy.  Anoxic encephalopathy: Pleasantly confused-able to answer simple questions.  Upper GI bleeding secondary to  large malignant gastric ulcer with acute blood loss anemia: Underwent EGD x2-and subsequently embolization of GDA by  IR.  No further bleeding apparent-on PPI.  Hemoglobin stable. EGD with biopsy showing adenocarcinoma.  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  Discussed the case with Dr. Learta Codding and patient both on 05/22/2020, plan is for patient to follow with oncologist nearest to his place of residence, discharge plan still not formulated, once he establishes care with an oncologist future plan of care can be decided at that time, most likely treatment will be palliative in nature.  Dysphagia: Secondary to anoxia-SLP following.  Delusions/hallucinations: Psych consulted-at times he gets agitated-but for the most part currently appears stable.  Twelve-lead EKG 6/4 AM with normal QTC-continue antipsychotics.  Nutrition Problem: Nutrition Problem: Severe Malnutrition Etiology: chronic illness(gastric lesion, multiple liver lesions, concern for metastatic disease) Signs/Symptoms: severe fat depletion, severe muscle depletion Interventions: Ensure Enlive (each supplement provides 350kcal and 20 grams of protein), Magic cup   Diet: Diet Order            Diet regular Room service appropriate? No; Fluid consistency: Thin  Diet effective now               Code Status:   DNR  Family Communication:  Previous MD spoke with niece [name] [phone] 6/3, message left on 05/22/2020 at 8 AM, called again on 05/22/2020 at 11 AM message left again  Disposition Plan:  Status is: Inpatient  Remains inpatient appropriate because:Inpatient level of care appropriate due to severity of illness  Dispo: The patient is from: Home              Anticipated d/c is to: SNF              Anticipated d/c date  is: 1 day              Patient currently is  medically stable to d/c.   Barriers to Discharge: SNF bed pending  Antimicrobial agents: Anti-infectives (From admission, onward)   Start     Dose/Rate Route Frequency Ordered Stop   03/26/20 1100  doxycycline (VIBRAMYCIN) 100 mg in sodium chloride 0.9 % 250 mL IVPB  Status:  Discontinued     100 mg 125 mL/hr over 120 Minutes Intravenous 2 times daily 03/26/20 1002 04/01/20 1035   03/25/20 1100  doxycycline (VIBRA-TABS) tablet 100 mg  Status:  Discontinued     100 mg Per Tube Every 12 hours 03/25/20 1009 03/26/20 1002   03/25/20 1015  doxycycline (VIBRAMYCIN) 100 mg in sodium chloride 0.9 % 250 mL IVPB  Status:  Discontinued    Note to Pharmacy: To be given after the tracheal aspirate is collected.   100 mg 125 mL/hr over 120 Minutes Intravenous Every 12 hours 03/25/20 1005 03/25/20 1008       Time spent: 15- minutes-Greater than 50% of this time was spent in counseling, explanation of diagnosis, planning of further management, and coordination of care.  MEDICATIONS: Scheduled Meds: . clonazePAM  0.5 mg Oral BID  . feeding supplement (ENSURE ENLIVE)  237 mL Oral QID  . FLUoxetine  10 mg Oral QHS  . OXcarbazepine  75 mg Oral BID  . pantoprazole  40 mg Oral BID  . polyethylene glycol  17 g Oral Daily  . QUEtiapine  100 mg Oral QHS  . QUEtiapine  75 mg Oral Daily   Continuous Infusions: . sodium chloride     PRN Meds:.sodium chloride, acetaminophen, docusate sodium,  guaiFENesin-dextromethorphan, hydrALAZINE, ipratropium-albuterol, LORazepam, oxyCODONE, sodium chloride flush   PHYSICAL EXAM: Vital signs: Vitals:   05/21/20 0746 05/21/20 1648 05/21/20 2159 05/22/20 0832  BP: 100/73 110/85 91/64 101/77  Pulse: 86 (!) 101 97 84  Resp: 17 17 17 16   Temp: 98.3 F (36.8 C) 98.2 F (36.8 C) 98 F (36.7 C) 98.2 F (36.8 C)  TempSrc:   Oral Oral  SpO2: 98% 99% 99% 99%  Weight:      Height:       Filed Weights   04/29/20 1557  04/30/20 0500 05/01/20 0648  Weight: 63 kg 63.9 kg 63 kg   Body mass index is 17.85 kg/m.   Gen Exam:   Awake Alert, No new F.N deficits, Normal affect [place].AT,PERRAL Supple Neck,No JVD, No cervical lymphadenopathy appriciated.  Symmetrical Chest wall movement, Good air movement bilaterally, CTAB RRR,No Gallops, Rubs or new Murmurs, No Parasternal Heave +ve B.Sounds, Abd Soft, No tenderness, No organomegaly appriciated, No rebound - guarding or rigidity. No Cyanosis, Clubbing or edema, No new Rash or bruise   I have personally reviewed following labs and imaging studies  LABORATORY DATA: CBC: Recent Labs  Lab 05/18/20 0334 05/22/20 0016  WBC 6.4 6.7  HGB 9.9* 9.2*  HCT 30.9* 28.7*  MCV 90.4 89.1  PLT 389 401    Basic Metabolic Panel: Recent Labs  Lab 05/18/20 0334 05/19/20 0418 05/22/20 0016  NA 140  --  139  K 4.2  --  4.0  CL 107  --  108  CO2 25  --  25  GLUCOSE 94  --  91  BUN 16  --  15  CREATININE 0.89  --  0.90  CALCIUM 8.5*  --  8.5*  MG 1.9 2.0 1.8    GFR: Estimated Creatinine Clearance: 76 mL/min (by C-G formula based on SCr of 0.9 mg/dL).  Liver Function Tests: Recent Labs  Lab 05/22/20 0016  AST 16  ALT 12  ALKPHOS 60  BILITOT 0.2*  PROT 5.0*  ALBUMIN 2.3*   No results for input(s): LIPASE, AMYLASE in the last 168 hours. No results for input(s): AMMONIA in the last 168 hours.  Coagulation Profile: No results for input(s): INR, PROTIME in the last 168 hours.  Cardiac Enzymes: No results for input(s): CKTOTAL, CKMB, CKMBINDEX, TROPONINI in the last 168 hours.  BNP (last 3 results) No results for input(s): PROBNP in the last 8760 hours.  Lipid Profile: No results for input(s): CHOL, HDL, LDLCALC, TRIG, CHOLHDL, LDLDIRECT in the last 72 hours.  Thyroid Function Tests: No results for input(s): TSH, T4TOTAL, FREET4, T3FREE, THYROIDAB in the last 72 hours.  Anemia Panel: No results for input(s): VITAMINB12, FOLATE, FERRITIN, TIBC,  IRON, RETICCTPCT in the last 72 hours.  Urine analysis:    Component Value Date/Time   COLORURINE AMBER (A) 04/11/2020 0153   APPEARANCEUR CLOUDY (A) 04/11/2020 0153   LABSPEC 1.024 04/11/2020 0153   PHURINE 7.0 04/11/2020 0153   GLUCOSEU NEGATIVE 04/11/2020 0153   HGBUR SMALL (A) 04/11/2020 0153   BILIRUBINUR NEGATIVE 04/11/2020 0153   KETONESUR NEGATIVE 04/11/2020 0153   PROTEINUR 100 (A) 04/11/2020 0153   NITRITE NEGATIVE 04/11/2020 0153   LEUKOCYTESUR LARGE (A) 04/11/2020 0153    Sepsis Labs: Lactic Acid, Venous    Component Value Date/Time   LATICACIDVEN 1.0 03/09/2020 0959    MICROBIOLOGY: Recent Results (from the past 240 hour(s))  SARS Coronavirus 2 by RT PCR (hospital order, performed in [facility] hospital lab) Nasopharyngeal Nasopharyngeal Swab  Status: None   Collection Time: 05/12/20 12:01 PM   Specimen: Nasopharyngeal Swab  Result Value Ref Range Status   SARS Coronavirus 2 NEGATIVE NEGATIVE Final    Comment: (NOTE) SARS-CoV-2 target nucleic acids are NOT DETECTED. The SARS-CoV-2 RNA is generally detectable in upper and lower respiratory specimens during the acute phase of infection. The lowest concentration of SARS-CoV-2 viral copies this assay can detect is 250 copies / mL. A negative result does not preclude SARS-CoV-2 infection and should not be used as the sole basis for treatment or other patient management decisions.  A negative result may occur with improper specimen collection / handling, submission of specimen other than nasopharyngeal swab, presence of viral mutation(s) within the areas targeted by this assay, and inadequate number of viral copies (<250 copies / mL). A negative result must be combined with clinical observations, patient history, and epidemiological information. Fact Sheet for Patients:   StrictlyIdeas.no Fact Sheet for Healthcare Providers: BankingDealers.co.za This test is not  yet approved or cleared  by the [place] FDA and has been authorized for detection and/or diagnosis of SARS-CoV-2 by FDA under an Emergency Use Authorization (EUA).  This EUA will remain in effect (meaning this test can be used) for the duration of the COVID-19 declaration under Section 564(b)(1) of the Act, 21 U.S.C. section 360bbb-3(b)(1), unless the authorization is terminated or revoked sooner. Performed at [facility], [place] [address] [place], [place] [address]     RADIOLOGY STUDIES/RESULTS: No results found.   LOS: 76 days   Signature  Lala Lund M.D on 05/22/2020 at 10:57 AM   -  To page go to [email or web address]     05/22/2020, 10:57 AM

## 2020-05-23 DIAGNOSIS — C7A8 Other malignant neuroendocrine tumors: Secondary | ICD-10-CM

## 2020-05-23 NOTE — Progress Notes (Signed)
Pt MEWS is yellow. Pt has baseline borderline hypotension at times. Doctors are aware.

## 2020-05-23 NOTE — Progress Notes (Signed)
PROGRESS NOTE        PATIENT DETAILS Name: Kenneth Sullivan Age: 64 y.o. Sex: male Date of Birth: [birth year] Admit Date: 03/07/2020 Admitting Physician Marshell Garfinkel, MD TIR:[id number], [name]  Brief Narrative: 64 year old with no significant past medical history.  Had acute shortness of breath with sats in the 40s on EMS arrival.  He had a witnessed bradycardic, asystolic arrest when EMS was present with 3 rounds of epi, 20 minutes to ROSC. Underwent normothermia protocol.  Work-up significant for massive PE, DVT with RV strain-unfortunately for the hospital course complicated by upper GI bleeding due to gastric ulcer.  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  Significant events: 3/23>> Admit, EGD with findings of oozing gastric ulcer with clot, IR for mechanical thrombectomy, IVC filter and GDA embolization 4/5>>Trach 4/28>>trach decannulated  Significant studies: CTA 3/23 >> bilateral PE with RV/with ratio 1.9, emphysema CT head 3/23 >> chronic microvascular changes.  No acute findings Echo 3/23 >> RV is severely dilated with reduced function and D-shaped septum, LVEF 60-65%. Venogram 3/23>> occlusive DVT of the iliac vein extending to the lower IVC LE Venous Duplex 3/24 >> acute DVT in BLE up to the femoral vein  MRI Brain 3/28 >> multifocal acute to subacute ischemic infarcts involving bilateral cerebral hemispheres, findings consistent with global hypoperfusion event related to cardiac arrest, associated scattered petechial hemorrhage about many areas of ischemia, evidence of hemorrhagic conversion in the right parietal lobe  Antimicrobial therapy: Doxycycline: 4/9>> 4/17  Microbiology  data: 3/23: Blood culture>> negative so far 4/10: Tracheal aspirate culture>> normal respiratory flora 4/10: Blood culture>> no growth 4/27: Urine culture>> Proteus mirabilis 3/23:Sars COV2 3 >> negative  Surgical pathology: 5/19 >>liver biopsy: Neuroendocrine tumor 5/28>> gastric ulcer biopsy by EGD-adenocarcinoma  Procedures : 3/23-3/24>> LTM EEG: No seizures-moderate to severe diffuse encephalopathy 3/23>> spot EEG: No seizures, severe to profound diffuse encephalopathy 3/23>> placement of retrievable IVC filter, pulmonary angiogram and mechanical/aspiration thrombectomy 3/23 >>EGD >> large gastric ulcer 3/23 >>embolization of right gastric artery/gastroduodenal artery 3/23 by IR>> 3/23 >>4/5 ETT 4/5>>Trach >>changed to 6 cuffless 4/22 >> decannulated 4/28 5/19>> ultrasound-guided liver biopsy 5/28>> EGD with biopsy of gastric ulcer-adenocarcinoma  Consults: CCM, IR, neurology, cardiology, oncology, [facility], palliative care for goals of care consulted on 05/21/2020  DVT Prophylaxis : SCD's  Subjective:  Patient in bed, appears comfortable, denies any headache, no fever, no chest pain or pressure, no shortness of breath , no abdominal pain. No focal weakness.  Assessment/Plan:  Acute hypoxic respiratory failure in the setting of PE/cardiac arrest: Managed in the ICU-s/p tracheotomy-subsequently decannulated-currently stable on room air.  Cardiac arrest: Secondary to massive PE-continue telemetry monitoring.  Patient is not a long-term anticoagulation candidate given large gastric ulcer-secondary to neuroendocrine tumor-given risk of massive GI bleeding.  Massive PE with DVT- s/p IVC filter: Not anticoagulation candidate-given malignant gastric ulcer with high risk potential for bleeding.  Is s/p IVC filter placement and mechanical thrombectomy.  Anoxic encephalopathy: Pleasantly confused-able to answer simple questions.  Upper GI bleeding secondary to large  malignant gastric ulcer with acute blood loss anemia: Underwent EGD x2-and subsequently embolization of GDA by IR.  No further bleeding apparent-on PPI.  Hemoglobin stable. EGD with biopsy showing adenocarcinoma.  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  Discussed the case with Dr. Learta Codding and patient both on 05/22/2020, plan is for patient to follow with oncologist nearest to his place of residence, once we know his place of disposition he will follow with the rest oncologist to come up with a final plan treatment versus hospice, social work looking for placement.  Dysphagia: Secondary to anoxia-SLP following.  Delusions/hallucinations: Psych consulted-at times he gets agitated-but for the most part currently appears stable.  Twelve-lead EKG 6/4 AM with normal QTC-continue antipsychotics.  Nutrition Problem: Nutrition Problem: Severe Malnutrition Etiology: chronic illness(gastric lesion, multiple liver lesions, concern for metastatic disease) Signs/Symptoms: severe fat depletion, severe muscle depletion Interventions: Ensure Enlive (each supplement provides 350kcal and 20 grams of protein), Magic cup   Diet: Diet Order            Diet regular Room service appropriate? No; Fluid consistency: Thin  Diet effective now               Code Status:   DNR  Family Communication:  Previous MD spoke with niece [name] [phone] 6/3, message left on 05/22/2020 at 8 AM, called again on 05/22/2020 at 82 AM message left again  Disposition Plan:  Status is: Inpatient  Remains inpatient appropriate because:Inpatient level of care appropriate due to severity of illness  Dispo: The patient is from: Home              Anticipated d/c is to: SNF              Anticipated d/c date is: any day               Patient currently is  medically stable to d/c, await bed    Barriers to Discharge: SNF bed pending  Antimicrobial agents: Anti-infectives (From admission, onward)   Start     Dose/Rate Route Frequency Ordered Stop   03/26/20 1100  doxycycline (VIBRAMYCIN) 100 mg in sodium chloride 0.9 % 250 mL IVPB  Status:  Discontinued     100 mg 125 mL/hr over 120 Minutes Intravenous 2 times daily 03/26/20 1002 04/01/20 1035   03/25/20 1100  doxycycline (VIBRA-TABS) tablet 100 mg  Status:  Discontinued     100 mg Per Tube Every 12 hours 03/25/20 1009 03/26/20 1002   03/25/20 1015  doxycycline (VIBRAMYCIN) 100 mg in sodium chloride 0.9 % 250 mL IVPB  Status:  Discontinued    Note to Pharmacy: To be given after the tracheal aspirate is collected.   100 mg 125 mL/hr over 120 Minutes Intravenous Every 12 hours 03/25/20 1005 03/25/20 1008       Time spent: 15- minutes-Greater than 50% of this time was spent in counseling, explanation of diagnosis, planning of further management, and coordination of care.  MEDICATIONS: Scheduled Meds: . clonazePAM  0.5 mg Oral BID  . feeding supplement (ENSURE ENLIVE)  237 mL Oral QID  . FLUoxetine  10 mg Oral QHS  . OXcarbazepine  75 mg Oral BID  . pantoprazole  40 mg Oral BID  . polyethylene glycol  17 g Oral Daily  . QUEtiapine  100 mg Oral QHS  . QUEtiapine  75 mg Oral Daily   Continuous Infusions: . sodium chloride     PRN Meds:.sodium chloride, acetaminophen, docusate sodium,  guaiFENesin-dextromethorphan, hydrALAZINE, ipratropium-albuterol, LORazepam, oxyCODONE, sodium chloride flush   PHYSICAL EXAM: Vital signs: Vitals:   05/22/20 0832 05/22/20 1738 05/23/20 0022 05/23/20 0815  BP: 101/77 102/77 96/70 105/80  Pulse: 84 93 87 (!) 102  Resp: 16 12 18 14   Temp: 98.2 F (36.8 C) 98.1 F (36.7 C) 97.6 F (36.4 C) 98.7 F (37.1 C)  TempSrc: Oral  Oral   SpO2: 99% 98% 100% 98%  Weight:      Height:       Filed Weights   04/29/20 1557  04/30/20 0500 05/01/20 0648  Weight: 63 kg 63.9 kg 63 kg   Body mass index is 17.85 kg/m.   Gen Exam:   Awake Alert, No new F.N deficits, Normal affect [place].AT,PERRAL Supple Neck,No JVD, No cervical lymphadenopathy appriciated.  Symmetrical Chest wall movement, Good air movement bilaterally, CTAB RRR,No Gallops, Rubs or new Murmurs, No Parasternal Heave +ve B.Sounds, Abd Soft, No tenderness, No organomegaly appriciated, No rebound - guarding or rigidity. No Cyanosis, Clubbing or edema, No new Rash or bruise   I have personally reviewed following labs and imaging studies  LABORATORY DATA: CBC: Recent Labs  Lab 05/18/20 0334 05/22/20 0016  WBC 6.4 6.7  HGB 9.9* 9.2*  HCT 30.9* 28.7*  MCV 90.4 89.1  PLT 389 790    Basic Metabolic Panel: Recent Labs  Lab 05/18/20 0334 05/19/20 0418 05/22/20 0016  NA 140  --  139  K 4.2  --  4.0  CL 107  --  108  CO2 25  --  25  GLUCOSE 94  --  91  BUN 16  --  15  CREATININE 0.89  --  0.90  CALCIUM 8.5*  --  8.5*  MG 1.9 2.0 1.8    GFR: Estimated Creatinine Clearance: 76 mL/min (by C-G formula based on SCr of 0.9 mg/dL).  Liver Function Tests: Recent Labs  Lab 05/22/20 0016  AST 16  ALT 12  ALKPHOS 60  BILITOT 0.2*  PROT 5.0*  ALBUMIN 2.3*   No results for input(s): LIPASE, AMYLASE in the last 168 hours. No results for input(s): AMMONIA in the last 168 hours.  Coagulation Profile: No results for input(s): INR, PROTIME in the last 168 hours.  Cardiac Enzymes: No results for input(s): CKTOTAL, CKMB, CKMBINDEX, TROPONINI in the last 168 hours.  BNP (last 3 results) No results for input(s): PROBNP in the last 8760 hours.  Lipid Profile: No results for input(s): CHOL, HDL, LDLCALC, TRIG, CHOLHDL, LDLDIRECT in the last 72 hours.  Thyroid Function Tests: No results for input(s): TSH, T4TOTAL, FREET4, T3FREE, THYROIDAB in the last 72 hours.  Anemia Panel: No results for input(s): VITAMINB12, FOLATE, FERRITIN, TIBC,  IRON, RETICCTPCT in the last 72 hours.  Urine analysis:    Component Value Date/Time   COLORURINE AMBER (A) 04/11/2020 0153   APPEARANCEUR CLOUDY (A) 04/11/2020 0153   LABSPEC 1.024 04/11/2020 0153   PHURINE 7.0 04/11/2020 0153   GLUCOSEU NEGATIVE 04/11/2020 0153   HGBUR SMALL (A) 04/11/2020 0153   BILIRUBINUR NEGATIVE 04/11/2020 0153   KETONESUR NEGATIVE 04/11/2020 0153   PROTEINUR 100 (A) 04/11/2020 0153   NITRITE NEGATIVE 04/11/2020 0153   LEUKOCYTESUR LARGE (A) 04/11/2020 0153    Sepsis Labs: Lactic Acid, Venous    Component Value Date/Time   LATICACIDVEN 1.0 03/09/2020 0959    MICROBIOLOGY: No results found for this or any previous visit (from the past 240 hour(s)).  RADIOLOGY STUDIES/RESULTS: No results found.   LOS: 77 days  Signature  Lala Lund M.D on 05/23/2020 at 9:49 AM   -  To page go to [email or web address]     05/23/2020, 9:49 AM

## 2020-05-23 NOTE — Plan of Care (Signed)
  Problem: Role Relationship: Goal: Method of communication will improve Outcome: Not Progressing   Problem: Education: Goal: Knowledge of General Education information will improve Description: Including pain rating scale, medication(s)/side effects and non-pharmacologic comfort measures Outcome: Not Progressing   Problem: Health Behavior/Discharge Planning: Goal: Ability to manage health-related needs will improve Outcome: Not Progressing   Problem: Nutrition: Goal: Adequate nutrition will be maintained Outcome: Not Progressing   Problem: Coping: Goal: Level of anxiety will decrease Outcome: Not Progressing   Problem: Safety: Goal: Ability to remain free from injury will improve Outcome: Not Progressing   Problem: Education: Goal: Knowledge of disease or condition will improve Outcome: Not Progressing Goal: Knowledge of secondary prevention will improve Outcome: Not Progressing Goal: Knowledge of patient specific risk factors addressed and post discharge goals established will improve Outcome: Not Progressing Goal: Individualized Educational Video(s) Outcome: Not Progressing   Problem: Coping: Goal: Will verbalize positive feelings about self Outcome: Not Progressing Goal: Will identify appropriate support needs Outcome: Not Progressing   Problem: Health Behavior/Discharge Planning: Goal: Ability to manage health-related needs will improve Outcome: Not Progressing   Problem: Self-Care: Goal: Ability to participate in self-care as condition permits will improve Outcome: Not Progressing Goal: Verbalization of feelings and concerns over difficulty with self-care will improve Outcome: Not Progressing Goal: Ability to communicate needs accurately will improve Outcome: Not Progressing   Problem: Nutrition: Goal: Risk of aspiration will decrease Outcome: Not Progressing Goal: Dietary intake will improve Outcome: Not Progressing   Problem: Intracerebral Hemorrhage  Tissue Perfusion: Goal: Complications of Intracerebral Hemorrhage will be minimized Outcome: Not Progressing   Problem: Ischemic Stroke/TIA Tissue Perfusion: Goal: Complications of ischemic stroke/TIA will be minimized Outcome: Not Progressing   Problem: Spontaneous Subarachnoid Hemorrhage Tissue Perfusion: Goal: Complications of Spontaneous Subarachnoid Hemorrhage will be minimized Outcome: Not Progressing

## 2020-05-23 NOTE — Progress Notes (Signed)
Daily Progress Note   Patient Name: Kenneth Sullivan       Date: 05/23/2020 DOB: [birth year]  Age: 64 y.o. MRN#: [id number] Attending Physician: Thurnell Lose, MD Primary Care Physician: Medicine, [name] Admit Date: 03/07/2020  Reason for Consultation/Follow-up: Establishing goals of care  Subjective: Patient in room, calm, awaiting breakfast.  Attempted to call Kenneth Sullivan- no answer and mailbox full at both phone numbers.   ROS  Length of Stay: 77  Current Medications: Scheduled Meds:  . clonazePAM  0.5 mg Oral BID  . feeding supplement (ENSURE ENLIVE)  237 mL Oral QID  . FLUoxetine  10 mg Oral QHS  . OXcarbazepine  75 mg Oral BID  . pantoprazole  40 mg Oral BID  . polyethylene glycol  17 g Oral Daily  . QUEtiapine  100 mg Oral QHS  . QUEtiapine  75 mg Oral Daily    Continuous Infusions: . sodium chloride      PRN Meds: sodium chloride, acetaminophen, docusate sodium, guaiFENesin-dextromethorphan, hydrALAZINE, ipratropium-albuterol, LORazepam, oxyCODONE, sodium chloride flush  Physical Exam          Vital Signs: BP 105/80 (BP Location: Left Arm)   Pulse (!) 102   Temp 98.7 F (37.1 C)   Resp 14   Ht 6\' 2"  (1.88 m)   Wt 63 kg   SpO2 98%   BMI 17.85 kg/m  SpO2: SpO2: 98 % O2 Device: O2 Device: Room Air O2 Flow Rate: O2 Flow Rate (L/min): 2 L/min  Intake/output summary:   Intake/Output Summary (Last 24 hours) at 05/23/2020 1027 Last data filed at 05/22/2020 2100 Gross per 24 hour  Intake 342 ml  Output --  Net 342 ml   LBM: Last BM Date: 05/18/20 Baseline Weight: Weight: 72.6 kg Most recent weight: Weight: (this was suppose to be done on nights.)       Palliative Assessment/Data: PPS: 40%   Flowsheet Rows     Most Recent Value  Intake Tab   Referral Department  Critical care  Unit at Time of Referral  ICU  Palliative Care Primary Diagnosis  Pulmonary  Date Notified  03/20/20  Palliative Care Type  New Palliative care  Reason for referral  Clarify Goals of Care  Date of Admission  03/07/20  Date first seen by Palliative Care  03/20/20  #  of days Palliative referral response time  0 Day(s)  # of days IP prior to Palliative referral  13  Clinical Assessment  Psychosocial & Spiritual Assessment  Palliative Care Outcomes      Patient Active Problem List   Diagnosis Date Noted  . Protein-calorie malnutrition, severe 05/08/2020  . Status post tracheostomy ([place])   . Palliative care by specialist   . Goals of care, counseling/discussion   . DNR (do not resuscitate)   . Pulmonary emboli ([place])   . 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 Acute respiratory failure ([place])   . Anoxic encephalopathy ([place])   . Cardiac arrest ([place]) 03/07/2020  . GI bleed 03/07/2020  . Encounter for central line placement     Palliative Care Assessment & Plan   Patient Profile: 64 year old with no significant past medical history. Had acute shortness of breath with sats in the 40s on EMS arrival. He had a witnessed bradycardic, asystolic arrest when EMS was present with 3 rounds of epi, 20 minutes to ROSC. Underwent normothermia protocol. Work-up significant for massive PE, DVT with RV strain, GI bleed from gastric ulcer. 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     Assessment/Recommendations/Plan   Unable to reach Kenneth Sullivan today. On my last discussion with her she was very intent on getting patient  closer to her at SNF.   Recommend continue current care with plans to d/c to SNF closer to patient's sister near [place]- recommend Palliative consult at discharge and Oncology consult where patient is transferred   Goals of Care and Additional Recommendations:  Limitations on Scope of Treatment: Full Scope Treatment  Code Status:  DNR  Prognosis:   Unable to determine  Discharge Planning:  To Be Determined   Thank you for allowing the Palliative Medicine Team to assist in the care of this patient.   Time In: 1015 Time Out: 1030 Total Time 15 mins Prolonged Time Billed no      Greater than 50%  of this time was spent counseling and coordinating care related to the above assessment and plan.  Mariana Kaufman, AGNP-C Palliative Medicine   Please contact Palliative Medicine Team phone at [phone] for questions and concerns.       Marland Kitchen

## 2020-05-24 NOTE — Progress Notes (Signed)
Nutrition Follow-up  DOCUMENTATION CODES:   Underweight, Severe malnutrition in context of chronic illness  INTERVENTION:   Obtain new weight   - Feeding assistance with meals and snacks  -Ensure Enlive poQID, each supplement provides 350 kcal and 20 grams of protein  -MagicCup TID with meals, each supplement provides 290 kcal and 9 grams of protein  - Encourage adequate PO intake  NUTRITION DIAGNOSIS:   Severe Malnutrition related to chronic illness (gastric lesion, multiple liver lesions, concern for metastatic disease) as evidenced by severe fat depletion, severe muscle depletion.  Ongoing  GOAL:   Patient will meet greater than or equal to 90% of their needs  Progressing  MONITOR:   PO intake, Supplement acceptance, Labs, Weight trends  REASON FOR ASSESSMENT:   Ventilator    ASSESSMENT:   64 yo male admitted S/P cardiac arrest, S/P normothermia protocol. Found to have massive PE, DVT, GI bleed from gastric ulcer. PMH includes current smoker, heavy alcohol use.  4/02 - trach  4/09 - Cortrak placed  4/11- pt pulled out trach, replaced by RT,TF held due to Cortrak leaking  4/13 - MBS, Dysphagia 1 with thin liquids started 4/14 - Cortrak removed 4/16 - Cortrak replaced 4/22 - Cortrak clogged, replaced by diagnostic radiology (tip in pre-pyloric region of stomach) 4/25 - diet advanced to Dysphagia 2 4/27 - tube feeds changed to nocturnal 4/28 - decannulated 4/30 - pulled Cortrak, TF stopped 5/03 - diet upgraded to Dysphagia 3 5/05 - diet upgraded to Regular 5/19 - s/p liver lesion biopsy by IR 5/28- EGD reveals gastric adenocarcinoma  Pt reports having stable appetite. Consumed 100% of breakfast per reports. Meal completions charted as 100% for his last two meals. Drinking Ensure QID and would like to continue. Awaiting SNF placement.   Admission weight: 72.6 kg  Most recent weight: 63 kg (taken 5/17)  Medications: miralax Labs: CBG  98-116  Diet Order:   Diet Order            Diet regular Room service appropriate? No; Fluid consistency: Thin  Diet effective now              EDUCATION NEEDS:   Not appropriate for education at this time  Skin:  Skin Assessment: Reviewed RN Assessment  Last BM:  6/7  Height:   Ht Readings from Last 1 Encounters:  04/29/20 6\' 2"  (1.88 m)    Weight:   Wt Readings from Last 1 Encounters:  05/01/20 63 kg    Ideal Body Weight:  86.4 kg  BMI:  Body mass index is 17.85 kg/m.  Estimated Nutritional Needs:   Kcal:  2100-2300  Protein:  110-125 gm  Fluid:  >/= 2 L  Mariana Single RD, LDN Clinical Nutrition Pager listed in [place]

## 2020-05-24 NOTE — Progress Notes (Signed)
Physical Therapy Treatment Patient Details Name: Kenneth Sullivan MRN: [id number] DOB: 07/26/1956 Today's Date: 05/24/2020    History of Present Illness Pt is 64 yo male with unknown medical hx.  He presented to ED on 3/23 with acute SHOB.  He had bradycardiac event that lead to asystole/cardiac arrest (ROSC achieved in 20 mins with 3 rounds epinephrine).  Pt was intubated and admitted to ICU.  Pt with massive PE/DVT and RV strain.  He underwent IR guided mechanical thrombectomy and IVC Filter.   Pt developed gastric ulcer and required GDA embolization.  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 Pt s/p trach on 4/5 and changed to cuffless on 4/22--decannualted 4/28    PT Comments    Pt cooperative and pleasant during PT today, ambulating hallway distance with use of RW as pt with significant fear of falling. Pt requires occasional min assist to steady during gait, and is highly distractible especially as it relates to food topics and others moving around him in hallway. PT continuing to recommend SNF.   Follow Up Recommendations  SNF     Equipment Recommendations  Rolling walker with 5" wheels    Recommendations for Other Services       Precautions / Restrictions Precautions Precautions: Fall;Other (comment) Precaution Comments: impulsive, easily agitated Restrictions Weight Bearing Restrictions: No    Mobility  Bed Mobility Overal bed mobility: Needs Assistance Bed Mobility: Supine to Sit Rolling: Supervision         General bed mobility comments: for safety, increased time to come to EOB.  Transfers Overall transfer level: Needs assistance Equipment used: Rolling walker (2 wheeled) Transfers: Sit to/from Stand Sit to Stand: Min  assist         General transfer comment: min assist for power up and steadying  Ambulation/Gait Ambulation/Gait assistance: Min assist;Min guard Gait Distance (Feet): 100 Feet Assistive device: Rolling walker (2 wheeled) Gait Pattern/deviations: Step-through pattern;Decreased stride length;Trunk flexed;Narrow base of support Gait velocity: decr   General Gait Details: min guard for safety, occasional min assist to steady. Verbal cuing for close proximity of RW, pt does not follow and requires PT physically moving RW closer to him.   Stairs             Wheelchair Mobility    Modified Rankin (Stroke Patients Only) Modified Rankin (Stroke Patients Only) Pre-Morbid Rankin Score: No symptoms Modified Rankin: Moderately severe disability     Balance Overall balance assessment: Needs assistance Sitting-balance support: No upper extremity supported;Feet supported Sitting balance-Leahy Scale: Good     Standing balance support: Bilateral upper extremity supported;During functional activity Standing balance-Leahy Scale: Poor Standing balance comment: reliant on external support                            Cognition Arousal/Alertness: Awake/alert Behavior During Therapy: WFL for tasks assessed/performed Overall Cognitive Status: Impaired/Different from baseline Area of Impairment: Orientation;Attention;Following commands;Safety/judgement;Awareness;Problem solving;Memory                 Orientation Level: Disoriented to;Time;Situation;Place Current Attention Level: Sustained Memory: Decreased short-term memory Following Commands: Follows one step commands inconsistently Safety/Judgement: Decreased awareness of deficits;Decreased awareness of safety Awareness: Intellectual Problem Solving: Difficulty sequencing;Requires tactile cues;Requires verbal cues;Decreased initiation;Slow processing General Comments: easily distracted, focused on eating ice cream  and what he is going to order for dinner. Pt perseverates on falling as well, very fearful of falling.      Exercises      General Comments        Pertinent Vitals/Pain Pain Assessment: No/denies pain Pain Intervention(s): Limited activity within patient's tolerance;Monitored during session    Home Living                      Prior Function            PT Goals (current goals can now be found in the care plan section) Acute Rehab PT Goals Patient Stated Goal: not stated PT Goal Formulation: With patient Time For Goal Achievement: 06/02/20 Potential to Achieve Goals: Fair Progress towards PT goals: Progressing toward goals    Frequency    Min 2X/week      PT Plan Current plan remains appropriate    Co-evaluation              AM-PAC PT "6 Clicks" Mobility   Outcome Measure  Help needed turning from your back to your side while in a flat bed without using bedrails?: A Little Help needed moving from lying on your back to sitting on the side of a flat bed without using bedrails?: A Little Help needed moving to and from a bed to a chair (including a wheelchair)?: A Little Help needed standing up from a chair using your arms (e.g., wheelchair or bedside chair)?: A Little Help needed to walk in hospital room?: A Little Help needed climbing 3-5 steps with a railing? : A Lot 6 Click Score: 17    End of Session Equipment Utilized During Treatment: Gait belt Activity Tolerance: Patient tolerated treatment well Patient left: in chair;with call bell/phone within reach;with chair alarm set(chair alarm pad and alarm belt) Nurse Communication: Mobility status PT Visit Diagnosis: Unsteadiness on feet (R26.81);Other abnormalities of gait and mobility (R26.89)     Time: 2023-3435 PT Time Calculation (min) (ACUTE ONLY): 14 min  Charges:  $Gait Training: 8-22 mins                     Kadey Mihalic E, PT [place] Pager [phone]  Office  [phone]   Joann Jorge D Elonda Husky 05/24/2020, 5:26 PM

## 2020-05-24 NOTE — Progress Notes (Signed)
PROGRESS NOTE        PATIENT DETAILS Name: Kenneth Sullivan Age: 64 y.o. Sex: male Date of Birth: 1956/02/04 Admit Date: 03/07/2020 Admitting Physician Marshell Garfinkel, MD [id number], [name]  Brief Narrative: 64 year old with no significant past medical history.  Had acute shortness of breath with sats in the 40s on EMS arrival.  He had a witnessed bradycardic, asystolic arrest when EMS was present with 3 rounds of epi, 20 minutes to ROSC. Underwent normothermia protocol.  Work-up significant for massive PE, DVT with RV strain-unfortunately for the hospital course complicated by upper GI bleeding due to gastric ulcer.  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  Significant events: 3/23>> Admit, EGD with findings of oozing gastric ulcer with clot, IR for mechanical thrombectomy, IVC filter and GDA embolization 4/5>>Trach 4/28>>trach decannulated  Significant studies: CTA 3/23 >> bilateral PE with RV/with ratio 1.9, emphysema CT head 3/23 >> chronic microvascular changes.  No acute findings Echo 3/23 >> RV is severely dilated with reduced function and D-shaped septum, LVEF 60-65%. Venogram 3/23>> occlusive DVT of the iliac vein extending to the lower IVC LE Venous Duplex 3/24 >> acute DVT in BLE up to the femoral vein  MRI Brain 3/28 >> multifocal acute to subacute ischemic infarcts involving bilateral cerebral hemispheres, findings consistent with global hypoperfusion event related to cardiac arrest, associated scattered petechial hemorrhage about many areas of ischemia, evidence of hemorrhagic conversion in the right parietal lobe  Antimicrobial therapy: Doxycycline: 4/9>> 4/17  Microbiology  data: 3/23: Blood culture>> negative so far 4/10: Tracheal aspirate culture>> normal respiratory flora 4/10: Blood culture>> no growth 4/27: Urine culture>> Proteus mirabilis 3/23:Sars COV2 3 >> negative  Surgical pathology: 5/19 >>liver biopsy: Neuroendocrine tumor 5/28>> gastric ulcer biopsy by EGD-adenocarcinoma  Procedures : 3/23-3/24>> LTM EEG: No seizures-moderate to severe diffuse encephalopathy 3/23>> spot EEG: No seizures, severe to profound diffuse encephalopathy 3/23>> placement of retrievable IVC filter, pulmonary angiogram and mechanical/aspiration thrombectomy 3/23 >>EGD >> large gastric ulcer 3/23 >>embolization of right gastric artery/gastroduodenal artery 3/23 by IR>> 3/23 >>4/5 ETT 4/5>>Trach >>changed to 6 cuffless 4/22 >> decannulated 4/28 5/19>> ultrasound-guided liver biopsy 5/28>> EGD with biopsy of gastric ulcer-adenocarcinoma  Consults: CCM, IR, neurology, cardiology, oncology, [facility], palliative care for goals of care consulted on 05/21/2020  DVT Prophylaxis : SCD's  Subjective:  Patient in bed, appears comfortable, denies any headache, no fever, no chest pain or pressure, no shortness of breath , no abdominal pain. No focal weakness.   Assessment/Plan:  Acute hypoxic respiratory failure in the setting of PE/cardiac arrest: Managed in the ICU-s/p tracheotomy-subsequently decannulated-currently stable on room air.  Cardiac arrest: Secondary to massive PE-continue telemetry monitoring.  Patient is not a long-term anticoagulation candidate given large gastric ulcer-secondary to neuroendocrine tumor-given risk of massive GI bleeding.  Massive PE with DVT- s/p IVC filter: Not anticoagulation candidate-given malignant gastric ulcer with high risk potential for bleeding.  Is s/p IVC filter placement and mechanical thrombectomy.  Anoxic encephalopathy: Pleasantly confused-able to answer simple questions.  Upper GI bleeding secondary to large  malignant gastric ulcer with acute blood loss anemia: Underwent EGD x2-and subsequently embolization of GDA by IR.  No further bleeding apparent-on PPI.  Hemoglobin stable. EGD with biopsy showing adenocarcinoma.  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  Discussed the case with Dr. Learta Codding and patient both on 05/22/2020, plan is for patient to follow with oncologist nearest to his place of residence, once we know his place of disposition he will follow with the rest oncologist to come up with a final plan treatment versus hospice, social work looking for placement.  Dysphagia: Secondary to anoxia-SLP following.  Delusions/hallucinations: Psych consulted-at times he gets agitated-but for the most part currently appears stable.  Twelve-lead EKG 6/4 AM with normal QTC-continue antipsychotics.  Nutrition Problem: Nutrition Problem: Severe Malnutrition Etiology: chronic illness(gastric lesion, multiple liver lesions, concern for metastatic disease) Signs/Symptoms: severe fat depletion, severe muscle depletion Interventions: Ensure Enlive (each supplement provides 350kcal and 20 grams of protein), Magic cup   Diet: Diet Order            Diet regular Room service appropriate? No; Fluid consistency: Thin  Diet effective now               Code Status:   DNR  Family Communication:  Previous MD spoke with niece [name] [phone] 6/3, message left on 05/22/2020 at 8 AM, called again on 05/22/2020 at 60 AM message left again  Disposition Plan:  Status is: Inpatient  Remains inpatient appropriate because:Inpatient level of care appropriate due to severity of illness  Dispo: The patient is from: Home              Anticipated d/c is to: SNF              Anticipated d/c date is: any day               Patient currently is  medically stable to d/c, await bed    Barriers to Discharge: SNF bed pending  Antimicrobial agents: Anti-infectives (From admission, onward)   Start     Dose/Rate Route Frequency Ordered Stop   03/26/20 1100  doxycycline (VIBRAMYCIN) 100 mg in sodium chloride 0.9 % 250 mL IVPB  Status:  Discontinued     100 mg 125 mL/hr over 120 Minutes Intravenous 2 times daily 03/26/20 1002 04/01/20 1035   03/25/20 1100  doxycycline (VIBRA-TABS) tablet 100 mg  Status:  Discontinued     100 mg Per Tube Every 12 hours 03/25/20 1009 03/26/20 1002   03/25/20 1015  doxycycline (VIBRAMYCIN) 100 mg in sodium chloride 0.9 % 250 mL IVPB  Status:  Discontinued    Note to Pharmacy: To be given after the tracheal aspirate is collected.   100 mg 125 mL/hr over 120 Minutes Intravenous Every 12 hours 03/25/20 1005 03/25/20 1008       Time spent: 15- minutes-Greater than 50% of this time was spent in counseling, explanation of diagnosis, planning of further management, and coordination of care.  MEDICATIONS: Scheduled Meds: . clonazePAM  0.5 mg Oral BID  . feeding supplement (ENSURE ENLIVE)  237 mL Oral QID  . FLUoxetine  10 mg Oral QHS  . OXcarbazepine  75 mg Oral BID  . pantoprazole  40 mg Oral BID  . polyethylene glycol  17 g Oral Daily  . QUEtiapine  100 mg Oral QHS  . QUEtiapine  75 mg Oral Daily   Continuous Infusions: . sodium chloride     PRN Meds:.sodium chloride, acetaminophen, docusate sodium,  guaiFENesin-dextromethorphan, hydrALAZINE, ipratropium-albuterol, LORazepam, oxyCODONE, sodium chloride flush   PHYSICAL EXAM: Vital signs: Vitals:   05/23/20 2045 05/24/20 0004 05/24/20 0830 05/24/20 0943  BP:  105/74 123/90 109/69  Pulse:  93 96 90  Resp: 18  19 18   Temp:  97.8 F (36.6 C) 99.1 F (37.3 C)   TempSrc:  Oral    SpO2:  95% 99% 98%  Weight:      Height:       Filed Weights   04/29/20 1557 04/30/20 0500 05/01/20 0648  Weight: 63 kg 63.9 kg 63 kg     Body mass index is 17.85 kg/m.   Gen Exam:   Awake Alert, No new F.N deficits, Normal affect [place].AT,PERRAL Supple Neck,No JVD, No cervical lymphadenopathy appriciated.  Symmetrical Chest wall movement, Good air movement bilaterally, CTAB RRR,No Gallops, Rubs or new Murmurs, No Parasternal Heave +ve B.Sounds, Abd Soft, No tenderness, No organomegaly appriciated, No rebound - guarding or rigidity. No Cyanosis, Clubbing or edema, No new Rash or bruise   I have personally reviewed following labs and imaging studies  LABORATORY DATA: CBC: Recent Labs  Lab 05/18/20 0334 05/22/20 0016  WBC 6.4 6.7  HGB 9.9* 9.2*  HCT 30.9* 28.7*  MCV 90.4 89.1  PLT 389 353    Basic Metabolic Panel: Recent Labs  Lab 05/18/20 0334 05/19/20 0418 05/22/20 0016  NA 140  --  139  K 4.2  --  4.0  CL 107  --  108  CO2 25  --  25  GLUCOSE 94  --  91  BUN 16  --  15  CREATININE 0.89  --  0.90  CALCIUM 8.5*  --  8.5*  MG 1.9 2.0 1.8    GFR: Estimated Creatinine Clearance: 76 mL/min (by C-G formula based on SCr of 0.9 mg/dL).  Liver Function Tests: Recent Labs  Lab 05/22/20 0016  AST 16  ALT 12  ALKPHOS 60  BILITOT 0.2*  PROT 5.0*  ALBUMIN 2.3*   No results for input(s): LIPASE, AMYLASE in the last 168 hours. No results for input(s): AMMONIA in the last 168 hours.  Coagulation Profile: No results for input(s): INR, PROTIME in the last 168 hours.  Cardiac Enzymes: No results for input(s): CKTOTAL, CKMB, CKMBINDEX, TROPONINI in the last 168 hours.  BNP (last 3 results) No results for input(s): PROBNP in the last 8760 hours.  Lipid Profile: No results for input(s): CHOL, HDL, LDLCALC, TRIG, CHOLHDL, LDLDIRECT in the last 72 hours.  Thyroid Function Tests: No results for input(s): TSH, T4TOTAL, FREET4, T3FREE, THYROIDAB in the last 72 hours.  Anemia Panel: No results for input(s): VITAMINB12, FOLATE, FERRITIN, TIBC, IRON, RETICCTPCT in the last 72 hours.  Urine analysis:     Component Value Date/Time   COLORURINE AMBER (A) 04/11/2020 0153   APPEARANCEUR CLOUDY (A) 04/11/2020 0153   LABSPEC 1.024 04/11/2020 0153   PHURINE 7.0 04/11/2020 0153   GLUCOSEU NEGATIVE 04/11/2020 0153   HGBUR SMALL (A) 04/11/2020 0153   BILIRUBINUR NEGATIVE 04/11/2020 0153   KETONESUR NEGATIVE 04/11/2020 0153   PROTEINUR 100 (A) 04/11/2020 0153   NITRITE NEGATIVE 04/11/2020 0153   LEUKOCYTESUR LARGE (A) 04/11/2020 0153    Sepsis Labs: Lactic Acid, Venous    Component Value Date/Time   LATICACIDVEN 1.0 03/09/2020 0959    MICROBIOLOGY: No results found for this or any previous visit (from the past 240 hour(s)).  RADIOLOGY STUDIES/RESULTS: No results found.   LOS: 78 days   Signature  Lala Lund M.D  on 05/24/2020 at 10:56 AM   -  To page go to [email or web address]     05/24/2020, 10:56 AM

## 2020-05-24 NOTE — Plan of Care (Signed)
  Problem: Role Relationship: Goal: Method of communication will improve Outcome: Progressing   Problem: Education: Goal: Knowledge of General Education information will improve Description: Including pain rating scale, medication(s)/side effects and non-pharmacologic comfort measures Outcome: Progressing   Problem: Health Behavior/Discharge Planning: Goal: Ability to manage health-related needs will improve Outcome: Progressing   Problem: Nutrition: Goal: Adequate nutrition will be maintained Outcome: Progressing   Problem: Coping: Goal: Level of anxiety will decrease Outcome: Progressing   Problem: Safety: Goal: Ability to remain free from injury will improve Outcome: Progressing   Problem: Education: Goal: Knowledge of disease or condition will improve Outcome: Progressing Goal: Knowledge of secondary prevention will improve Outcome: Progressing Goal: Knowledge of patient specific risk factors addressed and post discharge goals established will improve Outcome: Progressing Goal: Individualized Educational Video(s) Outcome: Progressing   Problem: Coping: Goal: Will verbalize positive feelings about self Outcome: Progressing Goal: Will identify appropriate support needs Outcome: Progressing   Problem: Health Behavior/Discharge Planning: Goal: Ability to manage health-related needs will improve Outcome: Progressing   Problem: Self-Care: Goal: Ability to participate in self-care as condition permits will improve Outcome: Progressing Goal: Verbalization of feelings and concerns over difficulty with self-care will improve Outcome: Progressing Goal: Ability to communicate needs accurately will improve Outcome: Progressing   Problem: Nutrition: Goal: Risk of aspiration will decrease Outcome: Progressing Goal: Dietary intake will improve Outcome: Progressing   Problem: Intracerebral Hemorrhage Tissue Perfusion: Goal: Complications of Intracerebral Hemorrhage will  be minimized Outcome: Progressing   Problem: Ischemic Stroke/TIA Tissue Perfusion: Goal: Complications of ischemic stroke/TIA will be minimized Outcome: Progressing   Problem: Spontaneous Subarachnoid Hemorrhage Tissue Perfusion: Goal: Complications of Spontaneous Subarachnoid Hemorrhage will be minimized Outcome: Progressing

## 2020-05-25 NOTE — Progress Notes (Signed)
PROGRESS NOTE        PATIENT DETAILS Name: Kenneth Sullivan Age: 64 y.o. Sex: male Date of Birth: 1956/02/25 Admit Date: 03/07/2020 Admitting Physician Marshell Garfinkel, MD [id number], [name]  Brief Narrative: 64 year old with no significant past medical history.  Had acute shortness of breath with sats in the 40s on EMS arrival.  He had a witnessed bradycardic, asystolic arrest when EMS was present with 3 rounds of epi, 20 minutes to ROSC. Underwent normothermia protocol.  Work-up significant for massive PE, DVT with RV strain-unfortunately for the hospital course complicated by upper GI bleeding due to gastric ulcer.  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  Significant events: 3/23>> Admit, EGD with findings of oozing gastric ulcer with clot, IR for mechanical thrombectomy, IVC filter and GDA embolization 4/5>>Trach 4/28>>trach decannulated  Significant studies: CTA 3/23 >> bilateral PE with RV/with ratio 1.9, emphysema CT head 3/23 >> chronic microvascular changes.  No acute findings Echo 3/23 >> RV is severely dilated with reduced function and D-shaped septum, LVEF 60-65%. Venogram 3/23>> occlusive DVT of the iliac vein extending to the lower IVC LE Venous Duplex 3/24 >> acute DVT in BLE up to the femoral vein  MRI Brain 3/28 >> multifocal acute to subacute ischemic infarcts involving bilateral cerebral hemispheres, findings consistent with global hypoperfusion event related to cardiac arrest, associated scattered petechial hemorrhage about many areas of ischemia, evidence of hemorrhagic conversion in the right parietal lobe  Antimicrobial therapy: Doxycycline: 4/9>> 4/17  Microbiology  data: 3/23: Blood culture>> negative so far 4/10: Tracheal aspirate culture>> normal respiratory flora 4/10: Blood culture>> no growth 4/27: Urine culture>> Proteus mirabilis 3/23:Sars COV2 3 >> negative  Surgical pathology: 5/19 >>liver biopsy: Neuroendocrine tumor 5/28>> gastric ulcer biopsy by EGD-adenocarcinoma  Procedures : 3/23-3/24>> LTM EEG: No seizures-moderate to severe diffuse encephalopathy 3/23>> spot EEG: No seizures, severe to profound diffuse encephalopathy 3/23>> placement of retrievable IVC filter, pulmonary angiogram and mechanical/aspiration thrombectomy 3/23 >>EGD >> large gastric ulcer 3/23 >>embolization of right gastric artery/gastroduodenal artery 3/23 by IR>> 3/23 >>4/5 ETT 4/5>>Trach >>changed to 6 cuffless 4/22 >> decannulated 4/28 5/19>> ultrasound-guided liver biopsy 5/28>> EGD with biopsy of gastric ulcer-adenocarcinoma  Consults: CCM, IR, neurology, cardiology, oncology, [facility], palliative care for goals of care consulted on 05/21/2020  DVT Prophylaxis : SCD's  Subjective:  Patient in bed, appears comfortable, denies any headache, no fever, no chest pain or pressure, no shortness of breath , no abdominal pain. No focal weakness.  Assessment/Plan:  Acute hypoxic respiratory failure in the setting of PE/cardiac arrest: Managed in the ICU-s/p tracheotomy-subsequently decannulated-currently stable on room air.  Cardiac arrest: Secondary to massive PE-continue telemetry monitoring.  Patient is not a long-term anticoagulation candidate given large gastric ulcer-secondary to neuroendocrine tumor-given risk of massive GI bleeding.  Massive PE with DVT- s/p IVC filter: Not anticoagulation candidate-given malignant gastric ulcer with high risk potential for bleeding.  Is s/p IVC filter placement and mechanical thrombectomy.  Anoxic encephalopathy: Pleasantly confused-able to answer simple questions.  Upper GI bleeding secondary to large  malignant gastric ulcer with acute blood loss anemia: Underwent EGD x2-and subsequently embolization of GDA by IR.  No further bleeding apparent-on PPI.  Hemoglobin stable. EGD with biopsy showing adenocarcinoma.  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  Discussed the case with Dr. Learta Codding and patient both on 05/22/2020, plan is for patient to follow with oncologist nearest to his place of residence, once we know his place of disposition he will follow with the rest oncologist to come up with a final plan treatment versus hospice, social work looking for placement.  Dysphagia: Secondary to anoxia-SLP following.  Delusions/hallucinations: Psych consulted-at times he gets agitated-but for the most part currently appears stable.  Twelve-lead EKG 6/4 AM with normal QTC-continue antipsychotics.  Nutrition Problem: Nutrition Problem: Severe Malnutrition Etiology: chronic illness (gastric lesion, multiple liver lesions, concern for metastatic disease) Signs/Symptoms: severe fat depletion, severe muscle depletion Interventions: Ensure Enlive (each supplement provides 350kcal and 20 grams of protein), Magic cup   Diet: Diet Order            Diet regular Room service appropriate? No; Fluid consistency: Thin  Diet effective now                  Code Status:   DNR  Family Communication:  Previous MD spoke with niece [name] [phone] 6/3, message left on 05/22/2020 at 8 AM, called again on 05/22/2020 at 13 AM message left again  Disposition Plan:  Status is: Inpatient  Remains inpatient appropriate because:Inpatient level of care appropriate due to severity of illness  Dispo: The patient is from: Home              Anticipated d/c is to: SNF              Anticipated d/c date is: any day               Patient currently is  medically stable to d/c, await bed    Barriers to Discharge: SNF bed pending  Antimicrobial agents: Anti-infectives (From admission, onward)   Start     Dose/Rate Route Frequency Ordered Stop   03/26/20 1100  doxycycline (VIBRAMYCIN) 100 mg in sodium chloride 0.9 % 250 mL IVPB  Status:  Discontinued        100 mg 125 mL/hr over 120 Minutes Intravenous 2 times daily 03/26/20 1002 04/01/20 1035   03/25/20 1100  doxycycline (VIBRA-TABS) tablet 100 mg  Status:  Discontinued        100 mg Per Tube Every 12 hours 03/25/20 1009 03/26/20 1002   03/25/20 1015  doxycycline (VIBRAMYCIN) 100 mg in sodium chloride 0.9 % 250 mL IVPB  Status:  Discontinued       Note to Pharmacy: To be given after the tracheal aspirate is collected.   100 mg 125 mL/hr over 120 Minutes Intravenous Every 12 hours 03/25/20 1005 03/25/20 1008       Time spent: 15- minutes-Greater than 50% of this time was spent in counseling, explanation of diagnosis, planning of further management, and coordination of care.  MEDICATIONS: Scheduled Meds: . clonazePAM  0.5 mg Oral BID  . feeding supplement (ENSURE ENLIVE)  237 mL Oral QID  . FLUoxetine  10 mg Oral QHS  . OXcarbazepine  75 mg Oral BID  . pantoprazole  40 mg Oral BID  . polyethylene glycol  17 g Oral Daily  . QUEtiapine  100 mg Oral QHS  . QUEtiapine  75 mg Oral Daily   Continuous Infusions: .  sodium chloride     PRN Meds:.sodium chloride, acetaminophen, docusate sodium, guaiFENesin-dextromethorphan, hydrALAZINE, ipratropium-albuterol, LORazepam, oxyCODONE, sodium chloride flush   PHYSICAL EXAM: Vital signs: Vitals:   05/24/20 1607 05/24/20 2142 05/25/20 0752 05/25/20 0926  BP: (!) 116/99 110/65 114/78   Pulse: (!) 110 87 94   Resp: 19 17 16 18   Temp: 98.6 F (37 C) 98.5 F (36.9 C) 98.6 F (37 C)   TempSrc:  Oral    SpO2: 99% 98% 95% 96%  Weight:      Height:       Filed Weights   04/29/20 1557 04/30/20 0500  05/01/20 0648  Weight: 63 kg 63.9 kg 63 kg   Body mass index is 17.85 kg/m.   Gen Exam:   Awake Alert, No new F.N deficits, Normal affect [place].AT,PERRAL Supple Neck,No JVD, No cervical lymphadenopathy appriciated.  Symmetrical Chest wall movement, Good air movement bilaterally, CTAB RRR,No Gallops, Rubs or new Murmurs, No Parasternal Heave +ve B.Sounds, Abd Soft, No tenderness, No organomegaly appriciated, No rebound - guarding or rigidity. No Cyanosis, Clubbing or edema, No new Rash or bruise   I have personally reviewed following labs and imaging studies  LABORATORY DATA: CBC: Recent Labs  Lab 05/22/20 0016  WBC 6.7  HGB 9.2*  HCT 28.7*  MCV 89.1  PLT 161    Basic Metabolic Panel: Recent Labs  Lab 05/19/20 0418 05/22/20 0016  NA  --  139  K  --  4.0  CL  --  108  CO2  --  25  GLUCOSE  --  91  BUN  --  15  CREATININE  --  0.90  CALCIUM  --  8.5*  MG 2.0 1.8    GFR: Estimated Creatinine Clearance: 76 mL/min (by C-G formula based on SCr of 0.9 mg/dL).  Liver Function Tests: Recent Labs  Lab 05/22/20 0016  AST 16  ALT 12  ALKPHOS 60  BILITOT 0.2*  PROT 5.0*  ALBUMIN 2.3*   No results for input(s): LIPASE, AMYLASE in the last 168 hours. No results for input(s): AMMONIA in the last 168 hours.  Coagulation Profile: No results for input(s): INR, PROTIME in the last 168 hours.  Cardiac Enzymes: No results for input(s): CKTOTAL, CKMB, CKMBINDEX, TROPONINI in the last 168 hours.  BNP (last 3 results) No results for input(s): PROBNP in the last 8760 hours.  Lipid Profile: No results for input(s): CHOL, HDL, LDLCALC, TRIG, CHOLHDL, LDLDIRECT in the last 72 hours.  Thyroid Function Tests: No results for input(s): TSH, T4TOTAL, FREET4, T3FREE, THYROIDAB in the last 72 hours.  Anemia Panel: No results for input(s): VITAMINB12, FOLATE, FERRITIN, TIBC, IRON, RETICCTPCT in the last 72 hours.  Urine analysis:    Component Value Date/Time   COLORURINE  AMBER (A) 04/11/2020 0153   APPEARANCEUR CLOUDY (A) 04/11/2020 0153   LABSPEC 1.024 04/11/2020 0153   PHURINE 7.0 04/11/2020 0153   GLUCOSEU NEGATIVE 04/11/2020 0153   HGBUR SMALL (A) 04/11/2020 0153   BILIRUBINUR NEGATIVE 04/11/2020 0153   KETONESUR NEGATIVE 04/11/2020 0153   PROTEINUR 100 (A) 04/11/2020 0153   NITRITE NEGATIVE 04/11/2020 0153   LEUKOCYTESUR LARGE (A) 04/11/2020 0153    Sepsis Labs: Lactic Acid, Venous    Component Value Date/Time   LATICACIDVEN 1.0 03/09/2020 0959    MICROBIOLOGY: No results found for this or any previous visit (from the past 240 hour(s)).  RADIOLOGY STUDIES/RESULTS: No results found.   LOS: 33 days   Signature  Lala Lund M.D on 05/25/2020  at 9:28 AM   -  To page go to [email or web address]     05/25/2020, 9:28 AM

## 2020-05-26 LAB — GLUCOSE, CAPILLARY: Glucose-Capillary: 104 mg/dL — ABNORMAL HIGH (ref 70–99)

## 2020-05-26 NOTE — Progress Notes (Addendum)
HEMATOLOGY-ONCOLOGY PROGRESS NOTE  SUBJECTIVE: Has no complaints today.  Awaiting SNF placement.  PHYSICAL EXAMINATION:  Vitals:   05/25/20 2143 05/26/20 0748  BP: 102/81 101/77  Pulse: 87 100  Resp: 18 19  Temp: 98.7 F (37.1 C) 98.2 F (36.8 C)  SpO2: 98% 100%   Filed Weights   04/29/20 1557 04/30/20 0500 05/01/20 0648  Weight: 63 kg 63.9 kg 63 kg    Intake/Output from previous day: No intake/output data recorded.  GENERAL:alert, no distress and comfortable ABDOMEN:abdomen soft, non-tender and normal bowel sounds  NEURO: Alert, confused at times and answered inappropriately at times  LABORATORY DATA:  I have reviewed the data as listed CMP Latest Ref Rng & Units 05/22/2020 05/18/2020 05/15/2020  Glucose 70 - 99 mg/dL 91 94 88  BUN 8 - 23 mg/dL _0 Creatinine 0.61 - 1.24 mg/dL 0.90 0.89 0.84  Sodium 135 - 145 mmol/L 139 140 140  Potassium 3.5 - 5.1 mmol/L 4.0 4.2 3.8  Chloride 98 - 111 mmol/L 108 107 106  CO2 22 - 32 mmol/L _1 Calcium 8.9 - 10.3 mg/dL 8.5(L) 8.5(L) 8.6(L)  Total Protein 6.5 - 8.1 g/dL 5.0(L) - -  Total Bilirubin 0.3 - 1.2 mg/dL 0.2(L) - -  Alkaline Phos 38 - 126 U/L 60 - -  AST 15 - 41 U/L 16 - -  ALT 0 - 44 U/L 12 - -    Lab Results  Component Value Date   WBC 6.7 05/22/2020   HGB 9.2 (L) 05/22/2020   HCT 28.7 (L) 05/22/2020   MCV 89.1 05/22/2020   PLT 350 05/22/2020   NEUTROABS 4.6 05/10/2020    CT ABDOMEN PELVIS W CONTRAST  Result Date: 05/01/2020 CLINICAL DATA:  Acute abdominal pain EXAM: CT ABDOMEN AND PELVIS WITH CONTRAST TECHNIQUE: Multidetector CT imaging of the abdomen and pelvis was performed using the standard protocol following bolus administration of intravenous contrast. CONTRAST:  180m OMNIPAQUE IOHEXOL 300 MG/ML  SOLN COMPARISON:  03/23/2020 FINDINGS: Lower chest: Trace dependent right pleural effusion. Lung bases otherwise clear. Normal heart size. No pericardial effusion. No hiatal hernia. Hepatobiliary: New  hypodense hepatic lesions bilaterally. Left hepatic dome lesion measures 5.4 cm. Peripheral right hepatic lesion measures 2.4 cm. Anterior right hepatic dome lesion measures 1 cm. These are all measured on image 12 series 3. Lesions are compatible with new hepatic metastases. Focal fat noted along the falciform ligament anteriorly as before. No intrahepatic biliary dilatation. Hepatic and portal veins are patent. Gallbladder collapsed. Pancreas: Unremarkable. No pancreatic ductal dilatation or surrounding inflammatory changes. Spleen: Overall small in size. No focal abnormality. No parenchymal lesion. Adrenals/Urinary Tract: Normal adrenal glands. No renal obstruction or hydronephrosis. Posterior left upper pole renal cyst measures 8 cm. No hydroureter or urinary tract calculus. Bladder under distended. Stomach/Bowel: Diffuse hypodense transmural wall thickening of the stomach antrum, image 24 series 3 and image 10 series 8 suspicious for a gastric antral mass, predominant along the posterior wall roughly measuring 7.1 x 3.3 cm, image 9 series 8. Proximal stomach is distended. Difficult to exclude some component of partial gastric outlet obstruction. There is air and contrast within the small intestine more distally. No small bowel obstruction pattern. No free air. No significant free fluid, ascites, hemorrhage, hematoma or abscess. Vascular/Lymphatic: Infrarenal and aortic bifurcation atherosclerosis without aneurysm or dissection. No occlusive process. Mesenteric and renal vasculature appear patent. IVC filter noted. No bulky adenopathy. Reproductive: No significant finding by CT. Other: No inguinal or abdominal wall  hernia. Musculoskeletal: Degenerative changes of the spine and lower lumbar facet arthropathy. 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 Consider GI consultation and repeat endoscopy. Aortic Atherosclerosis (ICD10-I70.0).  These results will be called to the ordering clinician or representative by the Radiologist Assistant, and communication documented in the PACS or [employer]. Electronically Signed   By: Jerilynn Mages.  Shick M.D.   On: 05/01/2020 16:02   US BIOPSY (LIVER)  Result Date: 05/03/2020 INDICATION: 64 year old male with multiple liver lesions and concern for metastatic disease EXAM: ULTRASOUND-GUIDED BIOPSY LEFT LIVER MASS MEDICATIONS: None. ANESTHESIA/SEDATION: Moderate (conscious) sedation was employed during this procedure. A total of Versed 2.0 mg and Fentanyl 100 mcg was administered intravenously. Moderate Sedation Time: 10 minutes. The patient's level of consciousness and vital signs were monitored continuously by radiology nursing throughout the procedure under my direct supervision. FLUOROSCOPY TIME:  None). COMPLICATIONS: None PROCEDURE: Informed written consent was obtained from the patient after a thorough discussion of the procedural risks, benefits and alternatives. All questions were addressed. Maximal Sterile Barrier Technique was utilized including caps, mask, sterile gowns, sterile gloves, sterile drape, hand hygiene and skin antiseptic. A timeout was performed prior to the initiation of the procedure Ultrasound survey of the bilateral liver lobe performed with images stored and sent to PACs. The subxiphoid region of the abdomen was prepped with chlorhexidine in a sterile fashion, and a sterile drape was applied covering the operative field. A sterile gown and sterile gloves were used for the procedure. Local anesthesia was provided with 1% Lidocaine. Once the patient is prepped and draped sterilely and the skin and subcutaneous tissues were generously infiltrated with 1% lidocaine. A 17 gauge introducer needle was advanced into the left liver lobe targeting heterogeneously echoic mass of the left liver. The stylet was removed, and multiple 18 gauge core biopsy were retrieved. Samples were placed into  formalin for transportation to the lab. Gel-Foam pledgets were then infused with a small amount of saline for assistance with hemostasis. The needle was removed, and a final ultrasound image was performed. The patient tolerated the procedure well and remained hemodynamically stable throughout. No complications were encountered and no significant blood loss was encounter. IMPRESSION: Status post ultrasound-guided biopsy of left liver mass. Signed, Dulcy Fanny. Dellia Nims, RPVI [facility] Radiology Electronically Signed   By: Corrie Mckusick D.O.   On: 05/03/2020 16:16   DG Abd 2 Views  Result Date: 04/28/2020 CLINICAL DATA:  64 year old male with abdominal pain. History of GI bleeding treated with mesenteric embolization. EXAM: ABDOMEN - 2 VIEW COMPARISON:  Noncontrast CT Abdomen and Pelvis 03/23/2020. Abdominal radiographs 04/11/2020 and earlier. FINDINGS: Three supine views of the abdomen and pelvis. Negative lung bases. Enteric feeding tube removed from last month. Stable IVC filter and epigastric embolization coils. Non obstructed bowel gas pattern. Decreased retained stool in the large bowel from last month. No acute osseous abnormality identified. IMPRESSION: No acute findings. Non obstructed bowel gas pattern with decreased retained stool in the colon from last month. Electronically Signed   By: Genevie Ann M.D.   On: 04/28/2020 12:46    ASSESSMENT AND PLAN: 1.    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  Biopsy was  consistent with  adenocarcinoma with neuroendocrine component. 2.  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  Tracheostomy placement on 03/20/2020, now healed 4.  Cardiac arrest on 03/07/2020 5.  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  History of acute blood loss anemia likely due to upper GI bleed 7.  Delusions/hallucinations  Kenneth Sullivan appears unchanged.  Disposition plan is still pending.  Trying to find placement.  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  The diagnosis has been discussed with both the patient as well as his niece.  It is unclear if the patient completely understands the diagnosis and the treatment options to have been outlined for him.  Treatment would include FOLFOX.  We have been holding off on treating him due to the uncertainty of his understanding of the diagnosis and treatment options and we were awaiting SNF placement.  The patient is also a candidate for palliative care/hospice.  However, disposition plan is still not clearly defined.  Niece is difficult to get in touch with at times.   Recommendations: 1.  Recommend consideration of ethics consultation for this patient. 2.  Await final disposition plan.   LOS: 80 days   Mikey Bussing, [place], AGPCNP-BC, AOCNP 05/26/20 Kenneth Sullivan was interviewed and examined.  He appears stable.  No complaint today.  He is oriented to the cancer diagnosis and hospital status.  We continue to wait on a disposition plan.  He has metastatic gastric cancer.  I explained that he has an incurable cancer.  Chemotherapy can result in a partial remission.  We would administer FOLFOX chemotherapy if he was planning to stay in [place].  We will check a PD1 and HER-2 testing to see if he may be a candidate for other therapies.  I am uncomfortable administering chemotherapy in the absence of a disposition plan and  reliable outpatient follow-up.  I will check on him again next week and I will be glad to discuss the situation with his family again.  His mental status appears improved compared to when we met him a few weeks ago, but remains altered.  We should consider an ethics consult regarding the indication for treating the cancer versus hospice care.

## 2020-05-26 NOTE — Progress Notes (Signed)
PROGRESS NOTE        PATIENT DETAILS Name: Kenneth Sullivan Age: 64 y.o. Sex: male Date of Birth: 06/28/1956 Admit Date: 03/07/2020 Admitting Physician Marshell Garfinkel, MD WEX:[id number], [name]  Brief Narrative: 64 year old with no significant past medical history.  Had acute shortness of breath with sats in the 40s on EMS arrival.  He had a witnessed bradycardic, asystolic arrest when EMS was present with 3 rounds of epi, 20 minutes to ROSC. Underwent normothermia protocol.  Work-up significant for massive PE, DVT with RV strain-unfortunately for the hospital course complicated by upper GI bleeding due to gastric ulcer.  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  Significant events: 3/23>> Admit, EGD with findings of oozing gastric ulcer with clot, IR for mechanical thrombectomy, IVC filter and GDA embolization 4/5>>Trach 4/28>>trach decannulated  Significant studies: CTA 3/23 >> bilateral PE with RV/with ratio 1.9, emphysema CT head 3/23 >> chronic microvascular changes.  No acute findings Echo 3/23 >> RV is severely dilated with reduced function and D-shaped septum, LVEF 60-65%. Venogram 3/23>> occlusive DVT of the iliac vein extending to the lower IVC LE Venous Duplex 3/24 >> acute DVT in BLE up to the femoral vein  MRI Brain 3/28 >> multifocal acute to subacute ischemic infarcts involving bilateral cerebral hemispheres, findings consistent with global hypoperfusion event related to cardiac arrest, associated scattered petechial hemorrhage about many areas of ischemia, evidence of hemorrhagic conversion in the right parietal lobe  Antimicrobial therapy: Doxycycline: 4/9>> 4/17  Microbiology  data: 3/23: Blood culture>> negative so far 4/10: Tracheal aspirate culture>> normal respiratory flora 4/10: Blood culture>> no growth 4/27: Urine culture>> Proteus mirabilis 3/23:Sars COV2 3 >> negative  Surgical pathology: 5/19 >>liver biopsy: Neuroendocrine tumor 5/28>> gastric ulcer biopsy by EGD-adenocarcinoma  Procedures : 3/23-3/24>> LTM EEG: No seizures-moderate to severe diffuse encephalopathy 3/23>> spot EEG: No seizures, severe to profound diffuse encephalopathy 3/23>> placement of retrievable IVC filter, pulmonary angiogram and mechanical/aspiration thrombectomy 3/23 >>EGD >> large gastric ulcer 3/23 >>embolization of right gastric artery/gastroduodenal artery 3/23 by IR>> 3/23 >>4/5 ETT 4/5>>Trach >>changed to 6 cuffless 4/22 >> decannulated 4/28 5/19>> ultrasound-guided liver biopsy 5/28>> EGD with biopsy of gastric ulcer-adenocarcinoma  Consults: CCM, IR, neurology, cardiology, oncology, [facility], palliative care for goals of care consulted on 05/21/2020  DVT Prophylaxis : SCD's  Subjective:  Patient walking in the hallway, appears comfortable, denies any headache, no fever, no chest pain or pressure, no shortness of breath , no abdominal pain. No focal weakness.   Assessment/Plan:  Acute hypoxic respiratory failure in the setting of PE/cardiac arrest: Managed in the ICU-s/p tracheotomy-subsequently decannulated-currently stable on room air.  Cardiac arrest: Secondary to massive PE-continue telemetry monitoring.  Patient is not a long-term anticoagulation candidate given large gastric ulcer-secondary to neuroendocrine tumor-given risk of massive GI bleeding.  Massive PE with DVT- s/p IVC filter: Not anticoagulation candidate-given malignant gastric ulcer with high risk potential for bleeding.  Is s/p IVC filter placement and mechanical thrombectomy.  Anoxic encephalopathy: Pleasantly confused-able to answer simple questions.  Upper GI bleeding  secondary to large malignant gastric ulcer with acute blood loss anemia: Underwent EGD x2-and subsequently embolization of GDA  by IR.  No further bleeding apparent-on PPI.  Hemoglobin stable. EGD with biopsy showing adenocarcinoma.  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  Discussed the case with Dr. Learta Codding and patient both on 05/22/2020 and again on 05/26/2020, plan is for patient to follow with oncologist nearest to his place of residence, once we know his place of disposition he will follow with the rest oncologist to come up with a final plan treatment versus hospice, social work looking for placement.  Dysphagia: Secondary to anoxia-SLP following.  Delusions/hallucinations: Psych consulted-at times he gets agitated-but for the most part currently appears stable.  Stable on present regimen.  Nutrition Problem: Nutrition Problem: Severe Malnutrition Etiology: chronic illness (gastric lesion, multiple liver lesions, concern for metastatic disease) Signs/Symptoms: severe fat depletion, severe muscle depletion Interventions: Ensure Enlive (each supplement provides 350kcal and 20 grams of protein), Magic cup   Diet: Diet Order            Diet regular Room service appropriate? No; Fluid consistency: Thin  Diet effective now                  Code Status:   DNR  Family Communication:  Previous MD spoke with niece [name] [phone] 6/3, message left on 05/22/2020 at 8 AM, called again on 05/22/2020 at 71 AM message left again  Disposition Plan:  Status is: Inpatient  Remains inpatient appropriate because:Inpatient level of care appropriate due to severity of illness  Dispo: The patient is from: Home              Anticipated d/c is to: SNF              Anticipated d/c date is: any  day              Patient now noted to be walking in the hallway without any assistance I question if he can be discharged home, I have discussed this with case manager on 05/26/2020, will have PT reevaluate him also.   Barriers to Discharge: SNF bed pending  Antimicrobial agents: Anti-infectives (From admission, onward)   Start     Dose/Rate Route Frequency Ordered Stop   03/26/20 1100  doxycycline (VIBRAMYCIN) 100 mg in sodium chloride 0.9 % 250 mL IVPB  Status:  Discontinued        100 mg 125 mL/hr over 120 Minutes Intravenous 2 times daily 03/26/20 1002 04/01/20 1035   03/25/20 1100  doxycycline (VIBRA-TABS) tablet 100 mg  Status:  Discontinued        100 mg Per Tube Every 12 hours 03/25/20 1009 03/26/20 1002   03/25/20 1015  doxycycline (VIBRAMYCIN) 100 mg in sodium chloride 0.9 % 250 mL IVPB  Status:  Discontinued       Note to Pharmacy: To be given after the tracheal aspirate is collected.   100 mg 125 mL/hr over 120 Minutes Intravenous Every 12 hours 03/25/20 1005 03/25/20 1008       Time spent: 15- minutes-Greater than 50% of this time was spent in counseling, explanation of diagnosis, planning of further management, and coordination of care.  MEDICATIONS: Scheduled Meds: . clonazePAM  0.5 mg Oral BID  . feeding supplement (ENSURE ENLIVE)  237 mL Oral QID  . FLUoxetine  10 mg Oral QHS  . OXcarbazepine  75 mg Oral BID  . pantoprazole  40 mg Oral BID  .  polyethylene glycol  17 g Oral Daily  . QUEtiapine  100 mg Oral QHS  . QUEtiapine  75 mg Oral Daily   Continuous Infusions: . sodium chloride     PRN Meds:.sodium chloride, acetaminophen, docusate sodium, guaiFENesin-dextromethorphan, hydrALAZINE, ipratropium-albuterol, LORazepam, oxyCODONE, sodium chloride flush   PHYSICAL EXAM: Vital signs: Vitals:   05/25/20 0926 05/25/20 1615 05/25/20 2143 05/26/20 0748  BP:  97/79 102/81 101/77  Pulse:  92 87 100  Resp: 18 19 18 19   Temp:  98.6 F (37 C) 98.7 F (37.1 C)  98.2 F (36.8 C)  TempSrc:   Oral Oral  SpO2: 96% 99% 98% 100%  Weight:      Height:       Filed Weights   04/29/20 1557 04/30/20 0500 05/01/20 0648  Weight: 63 kg 63.9 kg 63 kg   Body mass index is 17.85 kg/m.   Gen Exam:   Awake Alert, No new F.N deficits, Normal affect Strang.AT,PERRAL Supple Neck,No JVD, No cervical lymphadenopathy appriciated.  Symmetrical Chest wall movement, Good air movement bilaterally, CTAB RRR,No Gallops, Rubs or new Murmurs, No Parasternal Heave +ve B.Sounds, Abd Soft, No tenderness, No organomegaly appriciated, No rebound - guarding or rigidity. No Cyanosis, Clubbing or edema, No new Rash or bruise    I have personally reviewed following labs and imaging studies  LABORATORY DATA: CBC: Recent Labs  Lab 05/22/20 0016  WBC 6.7  HGB 9.2*  HCT 28.7*  MCV 89.1  PLT 762    Basic Metabolic Panel: Recent Labs  Lab 05/22/20 0016  NA 139  K 4.0  CL 108  CO2 25  GLUCOSE 91  BUN 15  CREATININE 0.90  CALCIUM 8.5*  MG 1.8    GFR: Estimated Creatinine Clearance: 76 mL/min (by C-G formula based on SCr of 0.9 mg/dL).  Liver Function Tests: Recent Labs  Lab 05/22/20 0016  AST 16  ALT 12  ALKPHOS 60  BILITOT 0.2*  PROT 5.0*  ALBUMIN 2.3*   No results for input(s): LIPASE, AMYLASE in the last 168 hours. No results for input(s): AMMONIA in the last 168 hours.  Coagulation Profile: No results for input(s): INR, PROTIME in the last 168 hours.  Cardiac Enzymes: No results for input(s): CKTOTAL, CKMB, CKMBINDEX, TROPONINI in the last 168 hours.  BNP (last 3 results) No results for input(s): PROBNP in the last 8760 hours.  Lipid Profile: No results for input(s): CHOL, HDL, LDLCALC, TRIG, CHOLHDL, LDLDIRECT in the last 72 hours.  Thyroid Function Tests: No results for input(s): TSH, T4TOTAL, FREET4, T3FREE, THYROIDAB in the last 72 hours.  Anemia Panel: No results for input(s): VITAMINB12, FOLATE, FERRITIN, TIBC, IRON, RETICCTPCT  in the last 72 hours.  Urine analysis:    Component Value Date/Time   COLORURINE AMBER (A) 04/11/2020 0153   APPEARANCEUR CLOUDY (A) 04/11/2020 0153   LABSPEC 1.024 04/11/2020 0153   PHURINE 7.0 04/11/2020 0153   GLUCOSEU NEGATIVE 04/11/2020 0153   HGBUR SMALL (A) 04/11/2020 0153   BILIRUBINUR NEGATIVE 04/11/2020 0153   KETONESUR NEGATIVE 04/11/2020 0153   PROTEINUR 100 (A) 04/11/2020 0153   NITRITE NEGATIVE 04/11/2020 0153   LEUKOCYTESUR LARGE (A) 04/11/2020 0153    Sepsis Labs: Lactic Acid, Venous    Component Value Date/Time   LATICACIDVEN 1.0 03/09/2020 0959    MICROBIOLOGY: No results found for this or any previous visit (from the past 240 hour(s)).  RADIOLOGY STUDIES/RESULTS: No results found.   LOS: 80 days   Signature  Lala Lund M.D on 05/26/2020  at 9:27 AM   -  To page go to [email or web address]     05/26/2020, 9:27 AM

## 2020-05-26 NOTE — Progress Notes (Signed)
Physical Therapy Treatment Patient Details Name: Kenneth Sullivan MRN: [id number] DOB: 04/07/1956 Today's Date: 05/26/2020    History of Present Illness Pt is a 64 y.o. male admitted 03/07/20 with acute SOB; had bradycardic event with asystole/cardiac arrest, ROSC achieved in 20 minutes with 3 rounds epinephrine; intubated 3/23. Pt with massive PE/DVT and RV strain. S/p IR guided mechanical thrombectomy and IVC filter placement. Devoloped gastric ulcer s/p GDA embolization. MRI 3/27 revealed multifocal acute to subacute ischemic infarct involving bilateral cerebral hemisphere consistent with global hypoperfusion; associated scattered petechial hemorrhages with evidence of hemmorhagic conversion in R parietal lobe. S/p trach 4/5, changed to cuffless 4/22; decannulated 4/28. Liver biopsy 5/19. 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 PMH unknown.    PT Comments    Pt demonstrates decreased balance with amb with and without handheld assistance requiring consistent min(A) to maintain balance and safety. Pt also required consistent verbal and tactile cues for direction, to correct drift toward wall and prevent running into objects. Pt reports when he falls he "can't catch myself", "I have fallen 3x since I have been here". Pt modified DGI score of 8/24 indicating high fall risk. When discussing d/c recommendations pt reported he would like to get rehab upon d/c to work on preventing his falls.  Will continue to follow acutely until d/c to next venue of care.    Follow Up Recommendations  SNF     Equipment Recommendations  Rolling walker with 5" wheels;Other (comment) (Pt reports he does not want RW)    Recommendations for Other Services       Precautions / Restrictions Precautions Precautions: Fall;Other (comment) Precaution Comments: impulsive    Mobility  Bed Mobility               General bed mobility comments: Pt  sitting EOB at start of session  Transfers Overall transfer level: Modified independent Equipment used: None Transfers: Sit to/from Stand Sit to Stand: Supervision         General transfer comment: Asked pt to wait to turn off bed alarm. Pt stood up before alarm could be turned off, did not require assistance to stand up  Ambulation/Gait Ambulation/Gait assistance: Min assist Gait Distance (Feet): 240 Feet Assistive device: None;1 person hand held assist Gait Pattern/deviations: Step-through pattern;Decreased stride length;Trunk flexed;Narrow base of support;Drifts right/left Gait velocity: decr   General Gait Details: Pt required min(A) for amb for saftey, pt drifted toward wall when amb, required hand held assistance to maintain saftey and decreased drift. Pt demonstrated decreased dynamic stability and balance with amb with hand held assistance and without hand held assistance. Pt declined used of RW.   Stairs             Wheelchair Mobility    Modified Rankin (Stroke Patients Only)       Balance Overall balance assessment: Needs assistance Sitting-balance support: No upper extremity supported;Feet supported Sitting balance-Leahy Scale: Good Sitting balance - Comments: able to stand up out of bed without assistance and maintain balance   Standing balance support: During functional activity;Single extremity supported Standing balance-Leahy Scale: Poor Standing balance comment: Pt required assistance throuhout amb with and without handheld support. Pt continued to drift towards wall and run into objects when walking. 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  Standardized Balance Assessment Standardized Balance Assessment : Dynamic Gait Index   Dynamic Gait Index Level Surface: Moderate Impairment Change in Gait Speed: Moderate Impairment Gait with Horizontal Head Turns: Moderate  Impairment Gait with Vertical Head Turns: Moderate Impairment Gait and Pivot Turn: Moderate Impairment (infered) Step Over Obstacle: Moderate Impairment Step Around Obstacles: Moderate Impairment (infered) Steps: Moderate Impairment (infered) Total Score: 8      Cognition Arousal/Alertness: Awake/alert Behavior During Therapy: WFL for tasks assessed/performed Overall Cognitive Status: Impaired/Different from baseline Area of Impairment: Attention;Following commands;Safety/judgement;Awareness;Problem solving;Memory                   Current Attention Level: Sustained Memory: Decreased short-term memory Following Commands: Follows one step commands with increased time;Follows multi-step commands inconsistently Safety/Judgement: Decreased awareness of deficits;Decreased awareness of safety Awareness: Intellectual Problem Solving: Requires tactile cues;Requires verbal cues;Slow processing General Comments: Pt agreeable to PT. Required tactile and verbal cues for safety. Required increased time for responses. Pt mentioned that he "has fallen 3x here, unable to catch himself"      Exercises      General Comments General comments (skin integrity, edema, etc.): VSS, reports he "cannot see out of left eye"      Pertinent Vitals/Pain Pain Assessment: No/denies pain    Home Living                      Prior Function            PT Goals (current goals can now be found in the care plan section) Acute Rehab PT Goals Time For Goal Achievement: 06/02/20 Potential to Achieve Goals: Fair    Frequency    Min 2X/week      PT Plan Current plan remains appropriate    Co-evaluation              AM-PAC PT "6 Clicks" Mobility   Outcome Measure  Help needed turning from your back to your side while in a flat bed without using bedrails?: None Help needed moving from lying on your back to sitting on the side of a flat bed without using bedrails?: None Help  needed moving to and from a bed to a chair (including a wheelchair)?: A Little Help needed standing up from a chair using your arms (e.g., wheelchair or bedside chair)?: None Help needed to walk in hospital room?: A Little Help needed climbing 3-5 steps with a railing? : A Lot 6 Click Score: 20    End of Session   Activity Tolerance: Patient tolerated treatment well Patient left: in bed;with bed alarm set;with call bell/phone within reach Nurse Communication: Mobility status PT Visit Diagnosis: Unsteadiness on feet (R26.81);Other abnormalities of gait and mobility (R26.89)     Time: 3254-9826 PT Time Calculation (min) (ACUTE ONLY): 11 min  Charges:  $Gait Training: 8-22 mins                     [employer] SPT 05/26/2020    Rolland Porter 05/26/2020, 4:53 PM

## 2020-05-27 NOTE — Progress Notes (Signed)
PROGRESS NOTE        PATIENT DETAILS Name: Kenneth Sullivan Age: 64 y.o. Sex: male Date of Birth: Jun 14, 1956 Admit Date: 03/07/2020 Admitting Physician Marshell Garfinkel, MD FIE:[id number], [name]  Brief Narrative: 64 year old with no significant past medical history.  Had acute shortness of breath with sats in the 40s on EMS arrival.  He had a witnessed bradycardic, asystolic arrest when EMS was present with 3 rounds of epi, 20 minutes to ROSC. Underwent normothermia protocol.  Work-up significant for massive PE, DVT with RV strain-unfortunately for the hospital course complicated by upper GI bleeding due to gastric ulcer.  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  Significant events: 3/23>> Admit, EGD with findings of oozing gastric ulcer with clot, IR for mechanical thrombectomy, IVC filter and GDA embolization 4/5>>Trach 4/28>>trach decannulated  Significant studies: CTA 3/23 >> bilateral PE with RV/with ratio 1.9, emphysema CT head 3/23 >> chronic microvascular changes.  No acute findings Echo 3/23 >> RV is severely dilated with reduced function and D-shaped septum, LVEF 60-65%. Venogram 3/23>> occlusive DVT of the iliac vein extending to the lower IVC LE Venous Duplex 3/24 >> acute DVT in BLE up to the femoral vein  MRI Brain 3/28 >> multifocal acute to subacute ischemic infarcts involving bilateral cerebral hemispheres, findings consistent with global hypoperfusion event related to cardiac arrest, associated scattered petechial hemorrhage about many areas of ischemia, evidence of hemorrhagic conversion in the right parietal lobe  Antimicrobial therapy: Doxycycline: 4/9>> 4/17  Microbiology  data: 3/23: Blood culture>> negative so far 4/10: Tracheal aspirate culture>> normal respiratory flora 4/10: Blood culture>> no growth 4/27: Urine culture>> Proteus mirabilis 3/23:Sars COV2 3 >> negative  Surgical pathology: 5/19 >>liver biopsy: Neuroendocrine tumor 5/28>> gastric ulcer biopsy by EGD-adenocarcinoma  Procedures : 3/23-3/24>> LTM EEG: No seizures-moderate to severe diffuse encephalopathy 3/23>> spot EEG: No seizures, severe to profound diffuse encephalopathy 3/23>> placement of retrievable IVC filter, pulmonary angiogram and mechanical/aspiration thrombectomy 3/23 >>EGD >> large gastric ulcer 3/23 >>embolization of right gastric artery/gastroduodenal artery 3/23 by IR>> 3/23 >>4/5 ETT 4/5>>Trach >>changed to 6 cuffless 4/22 >> decannulated 4/28 5/19>> ultrasound-guided liver biopsy 5/28>> EGD with biopsy of gastric ulcer-adenocarcinoma  Consults: CCM, IR, neurology, cardiology, oncology, [facility], palliative care for goals of care consulted on 05/21/2020  DVT Prophylaxis : SCD's  Subjective:  Patient in bed, appears comfortable, denies any headache, no fever, no chest pain or pressure, no shortness of breath , no abdominal pain. No focal weakness.  Assessment/Plan:  Acute hypoxic respiratory failure in the setting of PE/cardiac arrest: Managed in the ICU-s/p tracheotomy-subsequently decannulated-currently stable on room air.  Cardiac arrest: Secondary to massive PE-continue telemetry monitoring.  Patient is not a long-term anticoagulation candidate given large gastric ulcer-secondary to neuroendocrine tumor-given risk of massive GI bleeding.  Massive PE with DVT- s/p IVC filter: Not anticoagulation candidate-given malignant gastric ulcer with high risk potential for bleeding.  Is s/p IVC filter placement and mechanical thrombectomy.  Anoxic encephalopathy: Pleasantly confused-able to answer simple questions.  Upper GI bleeding secondary to large  malignant gastric ulcer with acute blood loss anemia: Underwent EGD x2-and subsequently embolization of GDA by IR.  No further bleeding apparent-on PPI.  Hemoglobin stable. EGD with biopsy showing adenocarcinoma.  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  Discussed the case with Dr. Learta Codding and patient both on 05/22/2020 and again on 05/26/2020, plan is for patient to follow with oncologist nearest to his place of residence, once we know his place of disposition he will follow with the rest oncologist to come up with a final plan treatment versus hospice, social work looking for placement.  Dysphagia: Secondary to anoxia-SLP following.  Delusions/hallucinations: Psych consulted-at times he gets agitated-but for the most part currently appears stable.  Stable on present regimen.  Nutrition Problem: Nutrition Problem: Severe Malnutrition Etiology: chronic illness (gastric lesion, multiple liver lesions, concern for metastatic disease) Signs/Symptoms: severe fat depletion, severe muscle depletion Interventions: Ensure Enlive (each supplement provides 350kcal and 20 grams of protein), Magic cup   Diet: Diet Order            Diet regular Room service appropriate? No; Fluid consistency: Thin  Diet effective now                  Code Status:   DNR  Family Communication:  Previous MD spoke with niece [name] [phone] 6/3, message left on 05/22/2020 at 8 AM, called again on 05/22/2020 at 69 AM message left again  Disposition Plan:  Status is: Inpatient  Remains inpatient appropriate because:Inpatient level of care appropriate due to severity of illness  Dispo: The patient is from: Home              Anticipated d/c is to: SNF              Anticipated d/c date is: any day         Charge  plan.  Per social work.  I requested social work to document in the chart on 05/26/2020 the plan for discharge.  Had also informed the charge nurse on 2 W. on 05/26/2020 and reminded her again on 05/27/2020 to kindly get documentation done from social work.  Also note that I have personally walked the patient in the hallway for over 5 minutes on 05/26/2020 and 05/27/2020 without any assistance whatsoever, this was witnessed by the charge nurse and the nurse on 05/27/2020.  I have requested the nurse on duty on 05/27/2020 2 documented in her note as well.   Barriers to Discharge: SNF bed pending  Antimicrobial agents: Anti-infectives (From admission, onward)   Start     Dose/Rate Route Frequency Ordered Stop   03/26/20 1100  doxycycline (VIBRAMYCIN) 100 mg in sodium chloride 0.9 % 250 mL IVPB  Status:  Discontinued        100 mg 125 mL/hr over 120 Minutes Intravenous 2 times daily 03/26/20 1002 04/01/20 1035   03/25/20 1100  doxycycline (VIBRA-TABS) tablet 100 mg  Status:  Discontinued        100 mg Per Tube Every 12 hours 03/25/20 1009 03/26/20 1002   03/25/20 1015  doxycycline (VIBRAMYCIN) 100 mg in sodium chloride 0.9 % 250 mL IVPB  Status:  Discontinued       Note to Pharmacy: To be given after the tracheal aspirate is collected.   100 mg 125 mL/hr over 120 Minutes Intravenous Every 12 hours 03/25/20 1005 03/25/20 1008       Time spent: 15- minutes-Greater than 50% of this time was spent in counseling,  explanation of diagnosis, planning of further management, and coordination of care.  MEDICATIONS: Scheduled Meds: . clonazePAM  0.5 mg Oral BID  . feeding supplement (ENSURE ENLIVE)  237 mL Oral QID  . FLUoxetine  10 mg Oral QHS  . OXcarbazepine  75 mg Oral BID  . pantoprazole  40 mg Oral BID  . polyethylene glycol  17 g Oral Daily  . QUEtiapine  100 mg Oral QHS  . QUEtiapine  75 mg Oral Daily   Continuous Infusions: . sodium chloride     PRN Meds:.sodium chloride, acetaminophen,  docusate sodium, guaiFENesin-dextromethorphan, hydrALAZINE, ipratropium-albuterol, LORazepam, oxyCODONE, sodium chloride flush   PHYSICAL EXAM: Vital signs: Vitals:   05/25/20 2143 05/26/20 0748 05/26/20 1612 05/26/20 2100  BP: 102/81 101/77 99/70 100/72  Pulse: 87 100 98 97  Resp: 18 19 16 16   Temp: 98.7 F (37.1 C) 98.2 F (36.8 C) 98.1 F (36.7 C) 98 F (36.7 C)  TempSrc: Oral Oral  Oral  SpO2: 98% 100% 99% 98%  Weight:      Height:       Filed Weights   04/29/20 1557 04/30/20 0500 05/01/20 0648  Weight: 63 kg 63.9 kg 63 kg   Body mass index is 17.85 kg/m.   Gen Exam:   Awake Alert, No new F.N deficits, Normal affect [place].AT,PERRAL Supple Neck,No JVD, No cervical lymphadenopathy appriciated.  Symmetrical Chest wall movement, Good air movement bilaterally, CTAB RRR,No Gallops, Rubs or new Murmurs, No Parasternal Heave +ve B.Sounds, Abd Soft, No tenderness, No organomegaly appriciated, No rebound - guarding or rigidity. No Cyanosis, Clubbing or edema, No new Rash or bruise    I have personally reviewed following labs and imaging studies  LABORATORY DATA: CBC: Recent Labs  Lab 05/22/20 0016  WBC 6.7  HGB 9.2*  HCT 28.7*  MCV 89.1  PLT 387    Basic Metabolic Panel: Recent Labs  Lab 05/22/20 0016  NA 139  K 4.0  CL 108  CO2 25  GLUCOSE 91  BUN 15  CREATININE 0.90  CALCIUM 8.5*  MG 1.8    GFR: Estimated Creatinine Clearance: 76 mL/min (by C-G formula based on SCr of 0.9 mg/dL).  Liver Function Tests: Recent Labs  Lab 05/22/20 0016  AST 16  ALT 12  ALKPHOS 60  BILITOT 0.2*  PROT 5.0*  ALBUMIN 2.3*   No results for input(s): LIPASE, AMYLASE in the last 168 hours. No results for input(s): AMMONIA in the last 168 hours.  Coagulation Profile: No results for input(s): INR, PROTIME in the last 168 hours.  Cardiac Enzymes: No results for input(s): CKTOTAL, CKMB, CKMBINDEX, TROPONINI in the last 168 hours.  BNP (last 3 results) No results  for input(s): PROBNP in the last 8760 hours.  Lipid Profile: No results for input(s): CHOL, HDL, LDLCALC, TRIG, CHOLHDL, LDLDIRECT in the last 72 hours.  Thyroid Function Tests: No results for input(s): TSH, T4TOTAL, FREET4, T3FREE, THYROIDAB in the last 72 hours.  Anemia Panel: No results for input(s): VITAMINB12, FOLATE, FERRITIN, TIBC, IRON, RETICCTPCT in the last 72 hours.  Urine analysis:    Component Value Date/Time   COLORURINE AMBER (A) 04/11/2020 0153   APPEARANCEUR CLOUDY (A) 04/11/2020 0153   LABSPEC 1.024 04/11/2020 0153   PHURINE 7.0 04/11/2020 0153   GLUCOSEU NEGATIVE 04/11/2020 0153   HGBUR SMALL (A) 04/11/2020 0153   BILIRUBINUR NEGATIVE 04/11/2020 0153   KETONESUR NEGATIVE 04/11/2020 0153   PROTEINUR 100 (A) 04/11/2020 0153   NITRITE NEGATIVE 04/11/2020 0153   LEUKOCYTESUR  LARGE (A) 04/11/2020 0153    Sepsis Labs: Lactic Acid, Venous    Component Value Date/Time   LATICACIDVEN 1.0 03/09/2020 0959    MICROBIOLOGY: No results found for this or any previous visit (from the past 240 hour(s)).  RADIOLOGY STUDIES/RESULTS: No results found.   LOS: 81 days   Signature  Lala Lund M.D on 05/27/2020 at 12:00 PM   -  To page go to [email or web address]     05/27/2020, 12:00 PM

## 2020-05-27 NOTE — Plan of Care (Signed)
°  Problem: Nutrition: Goal: Adequate nutrition will be maintained Outcome: Progressing   Problem: Coping: Goal: Level of anxiety will decrease Outcome: Progressing   Problem: Safety: Goal: Ability to remain free from injury will improve Outcome: Progressing   Problem: Self-Care: Goal: Ability to participate in self-care as condition permits will improve Outcome: Progressing Goal: Verbalization of feelings and concerns over difficulty with self-care will improve Outcome: Progressing Goal: Ability to communicate needs accurately will improve Outcome: Progressing   Problem: Nutrition: Goal: Risk of aspiration will decrease Outcome: Progressing Goal: Dietary intake will improve Outcome: Progressing   Problem: Self-Care: Goal: Ability to participate in self-care as condition permits will improve Outcome: Progressing Goal: Verbalization of feelings and concerns over difficulty with self-care will improve Outcome: Progressing Goal: Ability to communicate needs accurately will improve Outcome: Progressing

## 2020-05-27 NOTE — Progress Notes (Addendum)
MD requesting that this RN let the social worker who normally follows this patient to document notes about patient.  Social worker following him is not here today since it is the weekend and a different Education officer, museum is following.

## 2020-05-27 NOTE — Progress Notes (Signed)
Patient walked in hallway without assistant without complaints of pain, dizziness or SOB.

## 2020-05-28 NOTE — Progress Notes (Addendum)
Patient ambulating in room without any assistance. Balance has improved greatly but. Patient does need assistance with set up of meals but is able to feed himself with guidance. 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 No bowel or urinary incontinence.

## 2020-05-28 NOTE — Progress Notes (Signed)
PROGRESS NOTE        PATIENT DETAILS Name: Kenneth Sullivan Age: 64 y.o. Sex: male Date of Birth: Mar 26, 1956 Admit Date: 03/07/2020 Admitting Physician Kenneth Garfinkel, MD TIR:[id number], [name]  Brief Narrative: 64 year old with no significant past medical history.  Had acute shortness of breath with sats in the 40s on EMS arrival.  He had a witnessed bradycardic, asystolic arrest when EMS was present with 3 rounds of epi, 20 minutes to ROSC. Underwent normothermia protocol.  Work-up significant for massive PE, DVT with RV strain-unfortunately for the hospital course complicated by upper GI bleeding due to gastric ulcer.  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  Significant events: 3/23>> Admit, EGD with findings of oozing gastric ulcer with clot, IR for mechanical thrombectomy, IVC filter and GDA embolization 4/5>>Trach 4/28>>trach decannulated  Significant studies: CTA 3/23 >> bilateral PE with RV/with ratio 1.9, emphysema CT head 3/23 >> chronic microvascular changes.  No acute findings Echo 3/23 >> RV is severely dilated with reduced function and D-shaped septum, LVEF 60-65%. Venogram 3/23>> occlusive DVT of the iliac vein extending to the lower IVC LE Venous Duplex 3/24 >> acute DVT in BLE up to the femoral vein  MRI Brain 3/28 >> multifocal acute to subacute ischemic infarcts involving bilateral cerebral hemispheres, findings consistent with global hypoperfusion event related to cardiac arrest, associated scattered petechial hemorrhage about many areas of ischemia, evidence of hemorrhagic conversion in the right parietal lobe  Antimicrobial therapy: Doxycycline: 4/9>> 4/17  Microbiology  data: 3/23: Blood culture>> negative so far 4/10: Tracheal aspirate culture>> normal respiratory flora 4/10: Blood culture>> no growth 4/27: Urine culture>> Proteus mirabilis 3/23:Sars COV2 3 >> negative  Surgical pathology: 5/19 >>liver biopsy: Neuroendocrine tumor 5/28>> gastric ulcer biopsy by EGD-adenocarcinoma  Procedures : 3/23-3/24>> LTM EEG: No seizures-moderate to severe diffuse encephalopathy 3/23>> spot EEG: No seizures, severe to profound diffuse encephalopathy 3/23>> placement of retrievable IVC filter, pulmonary angiogram and mechanical/aspiration thrombectomy 3/23 >>EGD >> large gastric ulcer 3/23 >>embolization of right gastric artery/gastroduodenal artery 3/23 by IR>> 3/23 >>4/5 ETT 4/5>>Trach >>changed to 6 cuffless 4/22 >> decannulated 4/28 5/19>> ultrasound-guided liver biopsy 5/28>> EGD with biopsy of gastric ulcer-adenocarcinoma  Consults: CCM, IR, neurology, cardiology, oncology, [facility], palliative care for goals of care consulted on 05/21/2020  DVT Prophylaxis : SCD's  Subjective:  Patient in bed, appears comfortable, denies any headache, no fever, no chest pain or pressure, no shortness of breath , no abdominal pain. No focal weakness.   Assessment/Plan:  Acute hypoxic respiratory failure in the setting of PE/cardiac arrest: Managed in the ICU-s/p tracheotomy-subsequently decannulated-currently stable on room air.  Cardiac arrest: Secondary to massive PE-continue telemetry monitoring.  Patient is not a long-term anticoagulation candidate given large gastric ulcer-secondary to neuroendocrine tumor-given risk of massive GI bleeding.  Massive PE with DVT- s/p IVC filter: Not anticoagulation candidate-given malignant gastric ulcer with high risk potential for bleeding.  Is s/p IVC filter placement and mechanical thrombectomy.  Anoxic encephalopathy: Pleasantly confused-able to answer simple questions.  Upper GI bleeding secondary to large  malignant gastric ulcer with acute blood loss anemia: Underwent EGD x2-and subsequently embolization of GDA by IR.  No further bleeding apparent-on PPI.  Hemoglobin stable. EGD with biopsy showing adenocarcinoma.  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  Discussed the case with Dr. Learta Codding and patient both on 05/22/2020 and again on 05/26/2020, plan is for patient to follow with oncologist nearest to his place of residence, once we know his place of disposition he will follow with the rest oncologist to come up with a final plan treatment versus hospice, social work looking for placement.  Dysphagia: Secondary to anoxia-SLP following.  Delusions/hallucinations: Psych consulted-at times he gets agitated-but for the most part currently appears stable.  Stable on present regimen.  Nutrition Problem: Nutrition Problem: Severe Malnutrition Etiology: chronic illness (gastric lesion, multiple liver lesions, concern for metastatic disease) Signs/Symptoms: severe fat depletion, severe muscle depletion Interventions: Ensure Enlive (each supplement provides 350kcal and 20 grams of protein), Magic cup   Diet: Diet Order            Diet regular Room service appropriate? No; Fluid consistency: Thin  Diet effective now                  Code Status:   DNR  Family Communication:  Previous MD spoke with niece Kenneth Sullivan [phone] 6/3, message left on 05/22/2020 at 8 AM, called again on 05/22/2020 at 33 AM message left again  Disposition Plan:  Status is: Inpatient  Remains inpatient appropriate because:Inpatient level of care appropriate due to severity of illness  Dispo: The patient is from: Home              Anticipated d/c is to: SNF              Anticipated d/c date is: any [facility]         DC  plan.  Per social work.  I requested social work to document in the chart on 05/26/2020 the plan for discharge.  Had also informed the charge nurse on 2 W. on 05/26/2020 and reminded her again on 05/27/2020 to kindly get documentation done from social work.  Also note that I have personally walked the patient in the hallway for over 5 minutes on 05/26/2020, 05/27/2020, 05/28/2020 without any assistance whatsoever, this was witnessed by the charge nurse and the nurse on 05/27/2020.  I have requested the nurse on duty on 05/27/2020 to document it in her note as well.   Barriers to Discharge: SNF bed pending  Antimicrobial agents: Anti-infectives (From admission, onward)   Start     Dose/Rate Route Frequency Ordered Stop   03/26/20 1100  doxycycline (VIBRAMYCIN) 100 mg in sodium chloride 0.9 % 250 mL IVPB  Status:  Discontinued        100 mg 125 mL/hr over 120 Minutes Intravenous 2 times daily 03/26/20 1002 04/01/20 1035   03/25/20 1100  doxycycline (VIBRA-TABS) tablet 100 mg  Status:  Discontinued        100 mg Per Tube Every 12 hours 03/25/20 1009 03/26/20 1002   03/25/20 1015  doxycycline (VIBRAMYCIN) 100 mg in sodium chloride 0.9 % 250 mL IVPB  Status:  Discontinued       Note to Pharmacy: To be given after the tracheal aspirate is collected.   100 mg 125 mL/hr over 120 Minutes Intravenous Every 12 hours 03/25/20 1005 03/25/20 1008       Time spent: 15- minutes-Greater than 50% of this time was spent in  counseling, explanation of diagnosis, planning of further management, and coordination of care.  MEDICATIONS: Scheduled Meds: . clonazePAM  0.5 mg Oral BID  . feeding supplement (ENSURE ENLIVE)  237 mL Oral QID  . FLUoxetine  10 mg Oral QHS  . OXcarbazepine  75 mg Oral BID  . pantoprazole  40 mg Oral BID  . polyethylene glycol  17 g Oral Daily  . QUEtiapine  100 mg Oral QHS  . QUEtiapine  75 mg Oral Daily   Continuous Infusions: . sodium chloride     PRN Meds:.sodium chloride,  acetaminophen, docusate sodium, guaiFENesin-dextromethorphan, hydrALAZINE, ipratropium-albuterol, LORazepam, oxyCODONE, sodium chloride flush   PHYSICAL EXAM: Vital signs: Vitals:   05/26/20 0748 05/26/20 1612 05/26/20 2100 05/27/20 2125  BP: 101/77 99/70 100/72 97/69  Pulse: 100 98 97 93  Resp: 19 16 16 16   Temp: 98.2 F (36.8 C) 98.1 F (36.7 C) 98 F (36.7 C) 97.7 F (36.5 C)  TempSrc: Oral  Oral Oral  SpO2: 100% 99% 98% 99%  Weight:      Height:       Filed Weights   04/29/20 1557 04/30/20 0500 05/01/20 0648  Weight: 63 kg 63.9 kg 63 kg   Body mass index is 17.85 kg/m.   Gen Exam:   Awake Alert, No new F.N deficits, Normal affect [place].AT,PERRAL Supple Neck,No JVD, No cervical lymphadenopathy appriciated.  Symmetrical Chest wall movement, Good air movement bilaterally, CTAB RRR,No Gallops, Rubs or new Murmurs, No Parasternal Heave +ve B.Sounds, Abd Soft, No tenderness, No organomegaly appriciated, No rebound - guarding or rigidity. No Cyanosis, Clubbing or edema, No new Rash or bruise  I have personally reviewed following labs and imaging studies  LABORATORY DATA: CBC: Recent Labs  Lab 05/22/20 0016  WBC 6.7  HGB 9.2*  HCT 28.7*  MCV 89.1  PLT 062    Basic Metabolic Panel: Recent Labs  Lab 05/22/20 0016  NA 139  K 4.0  CL 108  CO2 25  GLUCOSE 91  BUN 15  CREATININE 0.90  CALCIUM 8.5*  MG 1.8    GFR: Estimated Creatinine Clearance: 76 mL/min (by C-G formula based on SCr of 0.9 mg/dL).  Liver Function Tests: Recent Labs  Lab 05/22/20 0016  AST 16  ALT 12  ALKPHOS 60  BILITOT 0.2*  PROT 5.0*  ALBUMIN 2.3*   No results for input(s): LIPASE, AMYLASE in the last 168 hours. No results for input(s): AMMONIA in the last 168 hours.  Coagulation Profile: No results for input(s): INR, PROTIME in the last 168 hours.  Cardiac Enzymes: No results for input(s): CKTOTAL, CKMB, CKMBINDEX, TROPONINI in the last 168 hours.  BNP (last 3  results) No results for input(s): PROBNP in the last 8760 hours.  Lipid Profile: No results for input(s): CHOL, HDL, LDLCALC, TRIG, CHOLHDL, LDLDIRECT in the last 72 hours.  Thyroid Function Tests: No results for input(s): TSH, T4TOTAL, FREET4, T3FREE, THYROIDAB in the last 72 hours.  Anemia Panel: No results for input(s): VITAMINB12, FOLATE, FERRITIN, TIBC, IRON, RETICCTPCT in the last 72 hours.  Urine analysis:    Component Value Date/Time   COLORURINE AMBER (A) 04/11/2020 0153   APPEARANCEUR CLOUDY (A) 04/11/2020 0153   LABSPEC 1.024 04/11/2020 0153   PHURINE 7.0 04/11/2020 0153   GLUCOSEU NEGATIVE 04/11/2020 0153   HGBUR SMALL (A) 04/11/2020 0153   BILIRUBINUR NEGATIVE 04/11/2020 0153   KETONESUR NEGATIVE 04/11/2020 0153   PROTEINUR 100 (A) 04/11/2020 0153   NITRITE NEGATIVE 04/11/2020 0153   LEUKOCYTESUR LARGE (  A) 04/11/2020 0153    Sepsis Labs: Lactic Acid, Venous    Component Value Date/Time   LATICACIDVEN 1.0 03/09/2020 0959    MICROBIOLOGY: No results found for this or any previous visit (from the past 240 hour(s)).  RADIOLOGY STUDIES/RESULTS: No results found.   LOS: 82 days   Signature  Lala Lund M.D on 05/28/2020 at 9:56 AM   -  To page go to [email or web address]     05/28/2020, 9:56 AM

## 2020-05-29 LAB — COMPREHENSIVE METABOLIC PANEL
ALT: 10 U/L (ref 0–44)
AST: 18 U/L (ref 15–41)
Albumin: 2.4 g/dL — ABNORMAL LOW (ref 3.5–5.0)
Alkaline Phosphatase: 66 U/L (ref 38–126)
Anion gap: 8 (ref 5–15)
BUN: 12 mg/dL (ref 8–23)
CO2: 24 mmol/L (ref 22–32)
Calcium: 8.7 mg/dL — ABNORMAL LOW (ref 8.9–10.3)
Chloride: 107 mmol/L (ref 98–111)
Creatinine, Ser: 0.85 mg/dL (ref 0.61–1.24)
GFR calc Af Amer: >60 mL/min (ref >60)
GFR calc non Af Amer: >60 mL/min (ref >60)
Glucose, Bld: 98 mg/dL (ref 70–99)
Potassium: 3.7 mmol/L (ref 3.5–5.1)
Sodium: 139 mmol/L (ref 135–145)
Total Bilirubin: 0.4 mg/dL (ref 0.3–1.2)
Total Protein: 5.3 g/dL — ABNORMAL LOW (ref 6.5–8.1)

## 2020-05-29 LAB — CBC
HCT: 27.6 % — ABNORMAL LOW (ref 39.0–52.0)
Hemoglobin: 8.9 g/dL — ABNORMAL LOW (ref 13.0–17.0)
MCH: 28.7 pg (ref 26.0–34.0)
MCHC: 32.2 g/dL (ref 30.0–36.0)
MCV: 89 fL (ref 80.0–100.0)
Platelets: 328 10*3/uL (ref 150–400)
RBC: 3.1 MIL/uL — ABNORMAL LOW (ref 4.22–5.81)
RDW: 16.8 % — ABNORMAL HIGH (ref 11.5–15.5)
WBC: 7.7 10*3/uL (ref 4.0–10.5)
nRBC: 0 % (ref 0.0–0.2)

## 2020-05-29 LAB — MAGNESIUM: Magnesium: 1.7 mg/dL (ref 1.7–2.4)

## 2020-05-29 MED ORDER — OXYCODONE HCL 5 MG PO TABS
10.0000 mg | ORAL_TABLET | ORAL | Status: DC | PRN
  Administered 2020-05-29 – 2020-06-12 (×27): 10 mg via ORAL
  Filled 2020-05-29 (×28): qty 2

## 2020-05-29 NOTE — TOC Transition Note (Signed)
Transition of Care [facility]) - CM/SW Discharge Note   Patient Details  Name: Kenneth Sullivan MRN: [id number] Date of Birth: 10/26/1956  [facility]) CM/SW Contact:  Angelita Ingles, RN Phone Number: 05/26/2020, 1000  Clinical Narrative:    05/26/20 @1000   CM received call from Dr. Candiss Norse asking CM to go to bedside to speak with the patient and write a note. MD states that the patient wants to know why he cant go home. CM attempted to call niece [name] before going in to speak with the patient. [place] phone went to a voicemail that has not been activated. CM at bedside to speak with patient. Patient is alert to self and place. Patient thinks it is 42 and can not verbalize who the president is. Patient is aware that he is at [facility] but can not verbalize the details of why he is here. Patient does verbalize that he can not see out of one of his eyes causing him to need help with eating , dressing, and ambulating. Patient states that he lives at [address] with his significant other [name]. Patient gives CM permission to call [name]. Spoke with [name] who confirms that she lived with the patient. [name] begins to cry and states that the patient doesn't have a home to come back to. [name] states that the niece [name] took everything from the patients room including his direct express card and stimulus check stating that she was the POA and needed the money to pay his funeral expenses because the patient was dying. [name] states that [name] promised to pay the rent but did not do so resulting in her having to vacate the premises. Patient has been made aware of this and has been connected with [name] via the phone. CM will continue to follow.   05/26/20 @1030  Spoke with Tammy at [facility] . Information has been sent for facility to come visit patient to determine if they can make a  Bed offer.   05/26/20 @1530  CM spoke with Tammy  at [facility] who suggest CM seek ALF for placement due to patient being able to ambulate 171ft and no real skilled nursing needs. CM has reached out to [facility] per recommendation. Spoke with Writer at [facility]. Tori requested CM to fax notes FL2 and med list. The nurse is currently not in but will be in tonight. Herbert Spires states that she will have the nurse review patients information and determine appropriateness. Information has been faxed. CM will await decision. Patient has been updated. Will continue to follow.    Final next level of care: Skilled Nursing Facility Barriers to Discharge: No SNF bed   Patient Goals and CMS Choice   CMS Medicare.gov Compare Post Acute Care list provided to:: Patient Represenative (must comment) [name]) Choice offered to / list presented to :  (Niece)  Discharge Placement                       Discharge Plan and Services In-house Referral: Clinical Social Work                                   Social Determinants of Health (SDOH) Interventions     Readmission Risk Interventions No flowsheet data found.

## 2020-05-29 NOTE — Progress Notes (Signed)
PROGRESS NOTE        PATIENT DETAILS Name: Kenneth Sullivan Age: 64 y.o. Sex: male Date of Birth: June 05, 1956 Admit Date: 03/07/2020 Admitting Physician Marshell Garfinkel, MD IOX:[id number], [name]  Brief Narrative: 64 year old with no significant past medical history.  Had acute shortness of breath with sats in the 40s on EMS arrival.  He had a witnessed bradycardic, asystolic arrest when EMS was present with 3 rounds of epi, 20 minutes to ROSC. Underwent normothermia protocol.  Work-up significant for massive PE, DVT with RV strain-unfortunately for the hospital course complicated by upper GI bleeding due to gastric ulcer.  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  Significant events: 3/23>> Admit, EGD with findings of oozing gastric ulcer with clot, IR for mechanical thrombectomy, IVC filter and GDA embolization 4/5>>Trach 4/28>>trach decannulated  Significant studies: CTA 3/23 >> bilateral PE with RV/with ratio 1.9, emphysema CT head 3/23 >> chronic microvascular changes.  No acute findings Echo 3/23 >> RV is severely dilated with reduced function and D-shaped septum, LVEF 60-65%. Venogram 3/23>> occlusive DVT of the iliac vein extending to the lower IVC LE Venous Duplex 3/24 >> acute DVT in BLE up to the femoral vein  MRI Brain 3/28 >> multifocal acute to subacute ischemic infarcts involving bilateral cerebral hemispheres, findings consistent with global hypoperfusion event related to cardiac arrest, associated scattered petechial hemorrhage about many areas of ischemia, evidence of hemorrhagic conversion in the right parietal lobe  Antimicrobial therapy: Doxycycline: 4/9>> 4/17  Microbiology  data: 3/23: Blood culture>> negative so far 4/10: Tracheal aspirate culture>> normal respiratory flora 4/10: Blood culture>> no growth 4/27: Urine culture>> Proteus mirabilis 3/23:Sars COV2 3 >> negative  Surgical pathology: 5/19 >>liver biopsy: Neuroendocrine tumor 5/28>> gastric ulcer biopsy by EGD-adenocarcinoma  Procedures : 3/23-3/24>> LTM EEG: No seizures-moderate to severe diffuse encephalopathy 3/23>> spot EEG: No seizures, severe to profound diffuse encephalopathy 3/23>> placement of retrievable IVC filter, pulmonary angiogram and mechanical/aspiration thrombectomy 3/23 >>EGD >> large gastric ulcer 3/23 >>embolization of right gastric artery/gastroduodenal artery 3/23 by IR>> 3/23 >>4/5 ETT 4/5>>Trach >>changed to 6 cuffless 4/22 >> decannulated 4/28 5/19>> ultrasound-guided liver biopsy 5/28>> EGD with biopsy of gastric ulcer-adenocarcinoma  Consults: CCM, IR, neurology, cardiology, oncology, [facility], palliative care for goals of care consulted on 05/21/2020  DVT Prophylaxis : SCD's  Subjective:  Patient in bed, appears comfortable, denies any headache, no fever, no chest pain or pressure, no shortness of breath , no abdominal pain. No focal weakness.   Assessment/Plan:  Acute hypoxic respiratory failure in the setting of PE/cardiac arrest: Managed in the ICU-s/p tracheotomy-subsequently decannulated-currently stable on room air.  Cardiac arrest: Secondary to massive PE-continue telemetry monitoring.  Patient is not a long-term anticoagulation candidate given large gastric ulcer-secondary to neuroendocrine tumor-given risk of massive GI bleeding.  Massive PE with DVT- s/p IVC filter: Not anticoagulation candidate-given malignant gastric ulcer with high risk potential for bleeding.  Is s/p IVC filter placement and mechanical thrombectomy.  Anoxic encephalopathy: Pleasantly confused-able to answer simple questions.  Upper GI bleeding secondary to large  malignant gastric ulcer with acute blood loss anemia: Underwent EGD x2-and subsequently embolization of GDA by IR.  No further bleeding apparent-on PPI.  Hemoglobin stable. EGD with biopsy showing adenocarcinoma.  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  Discussed the case with Dr. Learta Codding and patient both on 05/22/2020 and again on 05/26/2020, plan is for patient to follow with oncologist nearest to his place of residence, once we know his place of disposition he will follow with the rest oncologist to come up with a final plan treatment versus hospice, social work looking for placement.  If no permanent placement can be arranged despite all efforts then he will be transferred to [facility] long hospital on 05/30/2020 for initiation of chemotherapy in the hospital.  Dysphagia: Secondary to anoxia-SLP following.  Delusions/hallucinations: Psych consulted-at times he gets agitated-but for the most part currently appears stable.  Stable on present regimen.  Nutrition Problem: Nutrition Problem: Severe Malnutrition Etiology: chronic illness (gastric lesion, multiple liver lesions, concern for metastatic disease) Signs/Symptoms: severe fat depletion, severe muscle depletion Interventions: Ensure Enlive (each supplement provides 350kcal and 20 grams of protein), Magic cup   Diet: Diet Order            Diet regular Room service appropriate? No; Fluid consistency: Thin  Diet effective now                  Code Status:   DNR  Family Communication:  Previous MD spoke with niece [name] [phone] 6/3, message left on 05/22/2020 at 8 AM, called again on 05/22/2020 at 23 AM message left again  Disposition Plan:  Status is: Inpatient  Remains inpatient appropriate because:Inpatient level of care  appropriate due to severity of illness  Dispo: The patient is from: Home              Anticipated d/c is to: SNF              Anticipated d/c date is: any day          DC plan.  Per social work.  I requested social work to document in the chart on 05/26/2020 the plan for discharge.  Had also informed the charge nurse on 2 W. on 05/26/2020 and reminded her again on 05/27/2020 to kindly get documentation done from social work.  If patient cannot be discharged safely he will be transferred to [facility] long hospital on 05/30/2020 for initiation of chemotherapy, note he has been here for 85 days waiting for placement and initiation of chemotherapy I do not think this can be deferred any further.  Also note that I have personally walked the patient in the hallway for over 5 minutes on 05/26/2020, 05/27/2020, 05/28/2020 without any assistance whatsoever, this was witnessed by the charge nurse and the nurse on 05/27/2020.  I have requested the nurse on duty on 05/27/2020 to document it in her note as well.   Barriers to Discharge: SNF bed pending  Antimicrobial agents: Anti-infectives (From admission, onward)   Start     Dose/Rate Route Frequency Ordered Stop   03/26/20 1100  doxycycline (VIBRAMYCIN) 100 mg in sodium chloride 0.9 % 250 mL IVPB  Status:  Discontinued        100 mg 125 mL/hr over 120 Minutes Intravenous 2 times daily 03/26/20 1002 04/01/20 1035   03/25/20 1100  doxycycline (VIBRA-TABS) tablet 100 mg  Status:  Discontinued        100 mg Per Tube Every 12 hours 03/25/20 1009  03/26/20 1002   03/25/20 1015  doxycycline (VIBRAMYCIN) 100 mg in sodium chloride 0.9 % 250 mL IVPB  Status:  Discontinued       Note to Pharmacy: To be given after the tracheal aspirate is collected.   100 mg 125 mL/hr over 120 Minutes Intravenous Every 12 hours 03/25/20 1005 03/25/20 1008       Time spent: 15- minutes-Greater than 50% of this time was spent in counseling, explanation of diagnosis, planning of further  management, and coordination of care.  MEDICATIONS: Scheduled Meds: . clonazePAM  0.5 mg Oral BID  . feeding supplement (ENSURE ENLIVE)  237 mL Oral QID  . FLUoxetine  10 mg Oral QHS  . OXcarbazepine  75 mg Oral BID  . pantoprazole  40 mg Oral BID  . polyethylene glycol  17 g Oral Daily  . QUEtiapine  100 mg Oral QHS  . QUEtiapine  75 mg Oral Daily   Continuous Infusions: . sodium chloride     PRN Meds:.sodium chloride, acetaminophen, docusate sodium, guaiFENesin-dextromethorphan, hydrALAZINE, ipratropium-albuterol, LORazepam, oxyCODONE, sodium chloride flush   PHYSICAL EXAM: Vital signs: Vitals:   05/26/20 2100 05/27/20 2125 05/28/20 2157 05/29/20 0805  BP: 100/72 97/69 (!) 129/98 105/84  Pulse: 97 93 98 (!) 112  Resp: 16 16 18 16   Temp: 98 F (36.7 C) 97.7 F (36.5 C) 98 F (36.7 C) 98.5 F (36.9 C)  TempSrc: Oral Oral Oral Oral  SpO2: 98% 99% 99% 99%  Weight:      Height:       Filed Weights   04/29/20 1557 04/30/20 0500 05/01/20 0648  Weight: 63 kg 63.9 kg 63 kg   Body mass index is 17.85 kg/m.   Gen Exam:   Awake Alert, Ox 2, No new F.N deficits, Normal affect [place].AT,PERRAL Supple Neck,No JVD, No cervical lymphadenopathy appriciated.  Symmetrical Chest wall movement, Good air movement bilaterally, CTAB RRR,No Gallops, Rubs or new Murmurs, No Parasternal Heave +ve B.Sounds, Abd Soft, No tenderness, No organomegaly appriciated, No rebound - guarding or rigidity. No Cyanosis, Clubbing or edema, No new Rash or bruise   I have personally reviewed following labs and imaging studies  LABORATORY DATA: CBC: Recent Labs  Lab 05/28/20 2353  WBC 7.7  HGB 8.9*  HCT 27.6*  MCV 89.0  PLT 174    Basic Metabolic Panel: Recent Labs  Lab 05/28/20 2353  NA 139  K 3.7  CL 107  CO2 24  GLUCOSE 98  BUN 12  CREATININE 0.85  CALCIUM 8.7*  MG 1.7    GFR: Estimated Creatinine Clearance: 80.4 mL/min (by C-G formula based on SCr of 0.85 mg/dL).  Liver  Function Tests: Recent Labs  Lab 05/28/20 2353  AST 18  ALT 10  ALKPHOS 66  BILITOT 0.4  PROT 5.3*  ALBUMIN 2.4*   No results for input(s): LIPASE, AMYLASE in the last 168 hours. No results for input(s): AMMONIA in the last 168 hours.  Coagulation Profile: No results for input(s): INR, PROTIME in the last 168 hours.  Cardiac Enzymes: No results for input(s): CKTOTAL, CKMB, CKMBINDEX, TROPONINI in the last 168 hours.  BNP (last 3 results) No results for input(s): PROBNP in the last 8760 hours.  Lipid Profile: No results for input(s): CHOL, HDL, LDLCALC, TRIG, CHOLHDL, LDLDIRECT in the last 72 hours.  Thyroid Function Tests: No results for input(s): TSH, T4TOTAL, FREET4, T3FREE, THYROIDAB in the last 72 hours.  Anemia Panel: No results for input(s): VITAMINB12, FOLATE, FERRITIN, TIBC, IRON, RETICCTPCT in  the last 72 hours.  Urine analysis:    Component Value Date/Time   COLORURINE AMBER (A) 04/11/2020 0153   APPEARANCEUR CLOUDY (A) 04/11/2020 0153   LABSPEC 1.024 04/11/2020 0153   PHURINE 7.0 04/11/2020 0153   GLUCOSEU NEGATIVE 04/11/2020 0153   HGBUR SMALL (A) 04/11/2020 0153   BILIRUBINUR NEGATIVE 04/11/2020 0153   KETONESUR NEGATIVE 04/11/2020 0153   PROTEINUR 100 (A) 04/11/2020 0153   NITRITE NEGATIVE 04/11/2020 0153   LEUKOCYTESUR LARGE (A) 04/11/2020 0153    Sepsis Labs: Lactic Acid, Venous    Component Value Date/Time   LATICACIDVEN 1.0 03/09/2020 0959    MICROBIOLOGY: No results found for this or any previous visit (from the past 240 hour(s)).  RADIOLOGY STUDIES/RESULTS: No results found.   LOS: 83 days   Signature  Lala Lund M.D on 05/29/2020 at 11:14 AM   -  To page go to [email or web address]     05/29/2020, 11:14 AM

## 2020-05-29 NOTE — TOC Progression Note (Addendum)
Transition of Care [facility]) - Progression Note    Patient Details  Name: Eliaz Fout MRN: [id number] Date of Birth: 05/11/1956  Transition of Care [facility]) CM/SW [place], RN Phone Number: [phone]  05/29/2020, 1:03 PM  Clinical Narrative:    Message received from MD to go to bedside to speak with the patient to determine if the patient would like to file charges for allegations of theft of patients direct express card and stimulus checks. When CM questioned patient about pressing charges the patient states that he wants CM to call his niece and speak with her and allow patient to speak with niece. CM called niece [name] who states that she came to patients apartment to pick up patients belongings and was given a wallet/ socks/belt/ and 1 bag of clothing. Niece also states that she had card turned off and that she attempted to make arrangements for her uncles cremation but was not allowed to pay before death. Niece is crying on the telephone and states that she loves her uncle and would never steal from him. CM allowed patient to speak to [name] on the phone and he told her that he did not want to press charges on her and that he didn't believe she was stealing from him. Niece states that she wants patient to be close to her in [place] or [place]. Patient continues to tell CM that he does not want his niece to be upset and he does not want to press any type of charges against her. CM will sign off from this issue.  CM called [facility] to follow up on referral for assisted living. CM needs to speak with [facility] and [facility] is not avaliable. Message has been left. Will await return phone call.  05/29/2020 @ 1430 Second attempt to contact [facility] at [facility] in reference to possible ALF placement. Message left. Will await return call.   Expected Discharge Plan: Skilled Nursing Facility Barriers to Discharge: No SNF bed  Expected Discharge Plan and Services Expected  Discharge Plan: [place] In-house Referral: Clinical Social Work     Living arrangements for the past 2 months: Apartment                                       Social Determinants of Health (SDOH) Interventions    Readmission Risk Interventions No flowsheet data found.

## 2020-05-29 NOTE — Progress Notes (Signed)
Patient MEWS score 2 due to HR of 112. Assessed patient, he was walking around his room in no apparent distress, no complaints. Assessed patient again at 1130 patient complained of abdominal pain, administered pain meds, see MAR. At 1300 patient was resting comfortably in bed with no complaints.

## 2020-05-29 NOTE — Progress Notes (Addendum)
HEMATOLOGY-ONCOLOGY PROGRESS NOTE  SUBJECTIVE: Has no complaints today.  Awaiting SNF placement. He knows that he is at the hospital. Knows that he has cancer in his stomach.  Staff reports that he has periods where he is more confused but then he is lucid at times.  Still unsteady on his feet.  PHYSICAL EXAMINATION:  Vitals:   05/28/20 2157 05/29/20 0805  BP: (!) 129/98 105/84  Pulse: 98 (!) 112  Resp: 18 16  Temp: 98 F (36.7 C) 98.5 F (36.9 C)  SpO2: 99% 99%   Filed Weights   04/29/20 1557 04/30/20 0500 05/01/20 0648  Weight: 63 kg 63.9 kg 63 kg    Intake/Output from previous day: No intake/output data recorded.  GENERAL:alert, no distress and comfortable ABDOMEN:abdomen soft, non-tender and normal bowel sounds  NEURO: Alert, confused at times and answered inappropriately at times  LABORATORY DATA:  I have reviewed the data as listed CMP Latest Ref Rng & Units 05/28/2020 05/22/2020 05/18/2020  Glucose 70 - 99 mg/dL 98 91 94  BUN 8 - 23 mg/dL _0 Creatinine 0.61 - 1.24 mg/dL 0.85 0.90 0.89  Sodium 135 - 145 mmol/L 139 139 140  Potassium 3.5 - 5.1 mmol/L 3.7 4.0 4.2  Chloride 98 - 111 mmol/L 107 108 107  CO2 22 - 32 mmol/L _1 Calcium 8.9 - 10.3 mg/dL 8.7(L) 8.5(L) 8.5(L)  Total Protein 6.5 - 8.1 g/dL 5.3(L) 5.0(L) -  Total Bilirubin 0.3 - 1.2 mg/dL 0.4 0.2(L) -  Alkaline Phos 38 - 126 U/L 66 60 -  AST 15 - 41 U/L 18 16 -  ALT 0 - 44 U/L 10 12 -    Lab Results  Component Value Date   WBC 7.7 05/28/2020   HGB 8.9 (L) 05/28/2020   HCT 27.6 (L) 05/28/2020   MCV 89.0 05/28/2020   PLT 328 05/28/2020   NEUTROABS 4.6 05/10/2020    CT ABDOMEN PELVIS W CONTRAST  Result Date: 05/01/2020 CLINICAL DATA:  Acute abdominal pain EXAM: CT ABDOMEN AND PELVIS WITH CONTRAST TECHNIQUE: Multidetector CT imaging of the abdomen and pelvis was performed using the standard protocol following bolus administration of intravenous contrast. CONTRAST:  13m OMNIPAQUE IOHEXOL 300  MG/ML  SOLN COMPARISON:  03/23/2020 FINDINGS: Lower chest: Trace dependent right pleural effusion. Lung bases otherwise clear. Normal heart size. No pericardial effusion. No hiatal hernia. Hepatobiliary: New hypodense hepatic lesions bilaterally. Left hepatic dome lesion measures 5.4 cm. Peripheral right hepatic lesion measures 2.4 cm. Anterior right hepatic dome lesion measures 1 cm. These are all measured on image 12 series 3. Lesions are compatible with new hepatic metastases. Focal fat noted along the falciform ligament anteriorly as before. No intrahepatic biliary dilatation. Hepatic and portal veins are patent. Gallbladder collapsed. Pancreas: Unremarkable. No pancreatic ductal dilatation or surrounding inflammatory changes. Spleen: Overall small in size. No focal abnormality. No parenchymal lesion. Adrenals/Urinary Tract: Normal adrenal glands. No renal obstruction or hydronephrosis. Posterior left upper pole renal cyst measures 8 cm. No hydroureter or urinary tract calculus. Bladder under distended. Stomach/Bowel: Diffuse hypodense transmural wall thickening of the stomach antrum, image 24 series 3 and image 10 series 8 suspicious for a gastric antral mass, predominant along the posterior wall roughly measuring 7.1 x 3.3 cm, image 9 series 8. Proximal stomach is distended. Difficult to exclude some component of partial gastric outlet obstruction. There is air and contrast within the small intestine more distally. No small bowel obstruction pattern. No free air. No significant free  fluid, ascites, hemorrhage, hematoma or abscess. Vascular/Lymphatic: Infrarenal and aortic bifurcation atherosclerosis without aneurysm or dissection. No occlusive process. Mesenteric and renal vasculature appear patent. IVC filter noted. No bulky adenopathy. Reproductive: No significant finding by CT. Other: No inguinal or abdominal wall hernia. Musculoskeletal: Degenerative changes of the spine and lower lumbar facet arthropathy.  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 Consider GI consultation and repeat endoscopy. Aortic Atherosclerosis (ICD10-I70.0). These results will be called to the ordering clinician or representative by the Radiologist Assistant, and communication documented in the PACS or [employer]. Electronically Signed   By: Jerilynn Mages.  Shick M.D.   On: 05/01/2020 16:02   US BIOPSY (LIVER)  Result Date: 05/03/2020 INDICATION: 64 year old male with multiple liver lesions and concern for metastatic disease EXAM: ULTRASOUND-GUIDED BIOPSY LEFT LIVER MASS MEDICATIONS: None. ANESTHESIA/SEDATION: Moderate (conscious) sedation was employed during this procedure. A total of Versed 2.0 mg and Fentanyl 100 mcg was administered intravenously. Moderate Sedation Time: 10 minutes. The patient's level of consciousness and vital signs were monitored continuously by radiology nursing throughout the procedure under my direct supervision. FLUOROSCOPY TIME:  None). COMPLICATIONS: None PROCEDURE: Informed written consent was obtained from the patient after a thorough discussion of the procedural risks, benefits and alternatives. All questions were addressed. Maximal Sterile Barrier Technique was utilized including caps, mask, sterile gowns, sterile gloves, sterile drape, hand hygiene and skin antiseptic. A timeout was performed prior to the initiation of the procedure Ultrasound survey of the bilateral liver lobe performed with images stored and sent to PACs. The subxiphoid region of the abdomen was prepped with chlorhexidine in a sterile fashion, and a sterile drape was applied covering the operative field. A sterile gown and sterile gloves were used for the procedure. Local anesthesia was provided with 1% Lidocaine. Once the patient is prepped and draped sterilely and the skin and subcutaneous tissues were generously infiltrated with 1% lidocaine. A 17 gauge introducer  needle was advanced into the left liver lobe targeting heterogeneously echoic mass of the left liver. The stylet was removed, and multiple 18 gauge core biopsy were retrieved. Samples were placed into formalin for transportation to the lab. Gel-Foam pledgets were then infused with a small amount of saline for assistance with hemostasis. The needle was removed, and a final ultrasound image was performed. The patient tolerated the procedure well and remained hemodynamically stable throughout. No complications were encountered and no significant blood loss was encounter. IMPRESSION: Status post ultrasound-guided biopsy of left liver mass. Signed, Dulcy Fanny. Dellia Nims, RPVI [facility] Radiology Electronically Signed   By: Corrie Mckusick D.O.   On: 05/03/2020 16:16    ASSESSMENT AND PLAN: 1.    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 2.  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  Tracheostomy placement on 03/20/2020, now healed 4.  Cardiac arrest on 03/07/2020 5.  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  History of acute blood loss anemia likely due to upper  GI bleed 7.  Delusions/hallucinations  Mr. Gillian appears unchanged.  Disposition plan is still pending.  Still trying to find SNF placement.  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  The diagnosis has been discussed with both the patient as well as his niece.  The patient seems to understand that he has cancer in his stomach.  Unclear if he would tolerate chemotherapy. Treatment would include FOLFOX.  We have been holding off on treating him due to the uncertainty of his understanding of the diagnosis and treatment options and we were awaiting SNF placement.  The patient is also a candidate for palliative care/hospice.  However, disposition plan is still not clearly defined.  We have been unsuccessful in contacting his niece.  Recommendations: 1.  Recommend ethics consult regarding treatment of cancer versus hospice care. 2.  Awaiting final disposition plan 3.  Check PD 1, HER-2, and MSI testing on the gastric biopsy   LOS: 83 days   Mikey Bussing, [place], AGPCNP-BC, AOCNP 05/29/20 Mr. Dom appears unchanged.  He is oriented to place and diagnosis of cancer, not oriented to year or site of cancer diagnosis.  He is unclear on a disposition plan.  I reviewed the transition of care note from today.  It appears plans his niece would like to get him closer to her home and we are waiting on a referral to [facility] assisted living. He can have oncology follow-up there if he is transferred.  If he remains here we will consider an inpatient course of FOLFOX prior to discharge.  I remain uncomfortable giving a cycle of chemotherapy without a clear disposition plan.  We will check PD1, HER-2, and MSI on the gastric biopsy.

## 2020-05-30 LAB — SARS CORONAVIRUS 2 (TAT 6-24 HRS): SARS Coronavirus 2: NEGATIVE

## 2020-05-30 NOTE — TOC Progression Note (Addendum)
Transition of Care [facility]) - Progression Note    Patient Details  Name: Kenneth Sullivan MRN: [id number] Date of Birth: 12/11/1956  Transition of Care [facility]) CM/SW [place], RN Phone Number: [phone] 05/30/2020, 10:59 AM  Clinical Narrative:    CM spoke with Herbert Spires from [facility] assisted living. The facility is able to make a bed offer if the patient received Covid vaccine, negative Covid screening and TB test. Message sent to MD to make aware and MD ( Dr. Candiss Norse) reports that Covid vaccine can not be administered by the hospital and the patient will need transport to chemo.CM attempted to call Tori back to make aware. Herbert Spires is not avaliable message left to call CM back. CM will await return call.   05/30/20 @ 1015 Tori from [place] returned call to inform CM that the ALF will not be able to transport patient to chemo treatments. ALF is understandable to the hospital not being able to give covid vaccine but they can not transport to chemo treatments. Herbert Spires states that she will keep the file on her desk for a few days and CM can call if anything changes.    Expected Discharge Plan: Skilled Nursing Facility Barriers to Discharge: No SNF bed  Expected Discharge Plan and Services Expected Discharge Plan: [place] In-house Referral: Clinical Social Work     Living arrangements for the past 2 months: Apartment                                       Social Determinants of Health (SDOH) Interventions    Readmission Risk Interventions No flowsheet data found.

## 2020-05-30 NOTE — Progress Notes (Signed)
PROGRESS NOTE        PATIENT DETAILS Name: Kenneth Sullivan Age: 64 y.o. Sex: male Date of Birth: 11/26/1956 Admit Date: 03/07/2020 Admitting Physician Marshell Garfinkel, MD AQT:[id number], [name]  Brief Narrative: 64 year old with no significant past medical history.  Had acute shortness of breath with sats in the 40s on EMS arrival.  He had a witnessed bradycardic, asystolic arrest when EMS was present with 3 rounds of epi, 20 minutes to ROSC. Underwent normothermia protocol.  Work-up significant for massive PE, DVT with RV strain-unfortunately for the hospital course complicated by upper GI bleeding due to gastric ulcer.  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  Significant events: 3/23>> Admit, EGD with findings of oozing gastric ulcer with clot, IR for mechanical thrombectomy, IVC filter and GDA embolization 4/5>>Trach 4/28>>trach decannulated  Significant studies: CTA 3/23 >> bilateral PE with RV/with ratio 1.9, emphysema CT head 3/23 >> chronic microvascular changes.  No acute findings Echo 3/23 >> RV is severely dilated with reduced function and D-shaped septum, LVEF 60-65%. Venogram 3/23>> occlusive DVT of the iliac vein extending to the lower IVC LE Venous Duplex 3/24 >> acute DVT in BLE up to the femoral vein  MRI Brain 3/28 >> multifocal acute to subacute ischemic infarcts involving bilateral cerebral hemispheres, findings consistent with global hypoperfusion event related to cardiac arrest, associated scattered petechial hemorrhage about many areas of ischemia, evidence of hemorrhagic conversion in the right parietal lobe  Antimicrobial therapy: Doxycycline: 4/9>> 4/17  Microbiology  data: 3/23: Blood culture>> negative so far 4/10: Tracheal aspirate culture>> normal respiratory flora 4/10: Blood culture>> no growth 4/27: Urine culture>> Proteus mirabilis 3/23:Sars COV2 3 >> negative  Surgical pathology: 5/19 >>liver biopsy: Neuroendocrine tumor 5/28>> gastric ulcer biopsy by EGD-adenocarcinoma  Procedures : 3/23-3/24>> LTM EEG: No seizures-moderate to severe diffuse encephalopathy 3/23>> spot EEG: No seizures, severe to profound diffuse encephalopathy 3/23>> placement of retrievable IVC filter, pulmonary angiogram and mechanical/aspiration thrombectomy 3/23 >>EGD >> large gastric ulcer 3/23 >>embolization of right gastric artery/gastroduodenal artery 3/23 by IR>> 3/23 >>4/5 ETT 4/5>>Trach >>changed to 6 cuffless 4/22 >> decannulated 4/28 5/19>> ultrasound-guided liver biopsy 5/28>> EGD with biopsy of gastric ulcer-adenocarcinoma  Consults: CCM, IR, neurology, cardiology, oncology, [facility], palliative care for goals of care consulted on 05/21/2020  DVT Prophylaxis : SCD's  Subjective:  Patient in bed denies any headache, no chest or abdominal pain at this time, currently not short of breath, he says he wants to start treatment for his cancer he understands that he might not survive for a long time.   Assessment/Plan:  Acute hypoxic respiratory failure in the setting of PE/cardiac arrest: Managed in the ICU-s/p tracheotomy-subsequently decannulated-currently stable on room air.  Cardiac arrest: Secondary to massive PE-continue telemetry monitoring.  Patient is not a long-term anticoagulation candidate given large gastric ulcer-secondary to neuroendocrine tumor-given risk of massive GI bleeding.  Massive PE with DVT- s/p IVC filter: Not anticoagulation candidate-given malignant gastric ulcer with high risk potential for bleeding.  Is s/p IVC filter placement and mechanical thrombectomy.  Anoxic encephalopathy: Pleasantly confused-able to answer  simple questions.  Upper GI bleeding secondary to large malignant gastric ulcer  with acute blood loss anemia: Underwent EGD x2-and subsequently embolization of GDA by IR.  No further bleeding apparent-on PPI.  Hemoglobin stable. EGD with biopsy showing adenocarcinoma.  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  Discussed the case with Dr. Learta Codding and patient both on 05/22/2020 and again on 05/26/2020, plan is for patient to follow with oncologist nearest to his place of residence, once we know his place of disposition he will follow with the rest oncologist to come up with a final plan treatment versus hospice, social work looking for placement.  If no permanent placement can be arranged despite all efforts then he will be transferred to [facility] long hospital on 05/31/2020 for initiation of palliative chemotherapy in the hospital.  Dysphagia: Secondary to anoxia-SLP following, tolerating regular diet now.  Delusions/hallucinations: Psych consulted-at times he gets agitated-but for the most part currently appears stable.  Stable on present regimen and has currently no delusions or hallucinations whatsoever.  Nutrition Problem: Nutrition Problem: Severe Malnutrition Etiology: chronic illness (gastric lesion, multiple liver lesions, concern for metastatic disease) Signs/Symptoms: severe fat depletion, severe muscle depletion Interventions: Ensure Enlive (each supplement provides 350kcal and 20 grams of protein), Magic cup   Diet: Diet Order            Diet regular Room service appropriate? No; Fluid consistency: Thin  Diet effective now                  Code Status:   DNR  Family Communication:  Previous MD spoke with niece [name] [phone] 6/3, message left on 05/22/2020 at 8 AM, called  again on 05/22/2020 at 77 AM message left again  Disposition Plan:  Status is: Inpatient  Remains inpatient appropriate because:Inpatient level of care appropriate due to severity of illness  Dispo: The patient is from: Home              Anticipated d/c is to: SNF              Anticipated d/c date is: any day          DC plan.  Per social work.  I requested social work to document in the chart on 05/26/2020 the plan for discharge.  Had also informed the charge nurse on 2 W. on 05/26/2020 and reminded her again on 05/27/2020 to kindly get documentation done from social work.  If patient cannot be discharged safely he will be transferred to [facility] long hospital on 05/31/2020 for initiation of chemotherapy, note he has been here for 85 days waiting for placement and initiation of chemotherapy I do not think this can be deferred any further.  I have discussed this plan with Dr. Learta Codding as well on 05/30/2020.  We have been trying to place this patient close to 3 months and his palliative chemotherapy is on hold in view of placement, I do not think we can delay this any further if placement cannot be done, patient is fully able to comprehend the situation, is capable of making his own medical decisions although he has limited health care literacy but he is in no shape or form in capable of deciding for his future course of action.  Also note that I have personally walked the patient in the hallway for over 5 minutes on 05/26/2020, 05/27/2020, 05/28/2020, 05/29/2020 without any assistance whatsoever, this was witnessed by the charge nurse and  the nurse on 05/27/2020.  I have requested the nurse on duty on 05/27/2020 to document it in her note as well.   Barriers to Discharge: SNF bed pending  Antimicrobial agents: Anti-infectives (From admission, onward)   Start     Dose/Rate Route Frequency Ordered Stop   03/26/20 1100  doxycycline (VIBRAMYCIN) 100 mg in sodium chloride 0.9 % 250 mL IVPB  Status:  Discontinued         100 mg 125 mL/hr over 120 Minutes Intravenous 2 times daily 03/26/20 1002 04/01/20 1035   03/25/20 1100  doxycycline (VIBRA-TABS) tablet 100 mg  Status:  Discontinued        100 mg Per Tube Every 12 hours 03/25/20 1009 03/26/20 1002   03/25/20 1015  doxycycline (VIBRAMYCIN) 100 mg in sodium chloride 0.9 % 250 mL IVPB  Status:  Discontinued       Note to Pharmacy: To be given after the tracheal aspirate is collected.   100 mg 125 mL/hr over 120 Minutes Intravenous Every 12 hours 03/25/20 1005 03/25/20 1008       Time spent: 15- minutes-Greater than 50% of this time was spent in counseling, explanation of diagnosis, planning of further management, and coordination of care.  MEDICATIONS: Scheduled Meds:  clonazePAM  0.5 mg Oral BID   feeding supplement (ENSURE ENLIVE)  237 mL Oral QID   FLUoxetine  10 mg Oral QHS   OXcarbazepine  75 mg Oral BID   pantoprazole  40 mg Oral BID   polyethylene glycol  17 g Oral Daily   QUEtiapine  100 mg Oral QHS   QUEtiapine  75 mg Oral Daily   Continuous Infusions:  sodium chloride     PRN Meds:.sodium chloride, acetaminophen, docusate sodium, guaiFENesin-dextromethorphan, hydrALAZINE, ipratropium-albuterol, LORazepam, oxyCODONE, sodium chloride flush   PHYSICAL EXAM: Vital signs: Vitals:   05/29/20 1532 05/29/20 2159 05/29/20 2201 05/30/20 0500  BP: 104/82 98/64 98/64    Pulse: (!) 103 95 95   Resp: 18     Temp: 99.4 F (37.4 C) 97.8 F (36.6 C) 97.8 F (36.6 C)   TempSrc:   Oral   SpO2: 97%  100%   Weight:    67.6 kg  Height:       Filed Weights   04/30/20 0500 05/01/20 0648 05/30/20 0500  Weight: 63.9 kg 63 kg 67.6 kg   Body mass index is 19.13 kg/m.   Gen Exam:   Awake Alert, oriented x 2,  No new F.N deficits, Normal affect, walking in the room without any problems or assistance [place].AT,PERRAL Supple Neck,No JVD, No cervical lymphadenopathy appriciated.  Symmetrical Chest wall movement, Good air movement  bilaterally, CTAB RRR,No Gallops, Rubs or new Murmurs, No Parasternal Heave +ve B.Sounds, Abd Soft, No tenderness, No organomegaly appriciated, No rebound - guarding or rigidity. No Cyanosis, Clubbing or edema, No new Rash or bruise    I have personally reviewed following labs and imaging studies  LABORATORY DATA: CBC: Recent Labs  Lab 05/28/20 2353  WBC 7.7  HGB 8.9*  HCT 27.6*  MCV 89.0  PLT 009    Basic Metabolic Panel: Recent Labs  Lab 05/28/20 2353  NA 139  K 3.7  CL 107  CO2 24  GLUCOSE 98  BUN 12  CREATININE 0.85  CALCIUM 8.7*  MG 1.7    GFR: Estimated Creatinine Clearance: 86.2 mL/min (by C-G formula based on SCr of 0.85 mg/dL).  Liver Function Tests: Recent Labs  Lab 05/28/20 2353  AST 18  ALT  10  ALKPHOS 66  BILITOT 0.4  PROT 5.3*  ALBUMIN 2.4*   No results for input(s): LIPASE, AMYLASE in the last 168 hours. No results for input(s): AMMONIA in the last 168 hours.  Coagulation Profile: No results for input(s): INR, PROTIME in the last 168 hours.  Cardiac Enzymes: No results for input(s): CKTOTAL, CKMB, CKMBINDEX, TROPONINI in the last 168 hours.  BNP (last 3 results) No results for input(s): PROBNP in the last 8760 hours.  Lipid Profile: No results for input(s): CHOL, HDL, LDLCALC, TRIG, CHOLHDL, LDLDIRECT in the last 72 hours.  Thyroid Function Tests: No results for input(s): TSH, T4TOTAL, FREET4, T3FREE, THYROIDAB in the last 72 hours.  Anemia Panel: No results for input(s): VITAMINB12, FOLATE, FERRITIN, TIBC, IRON, RETICCTPCT in the last 72 hours.  Urine analysis:    Component Value Date/Time   COLORURINE AMBER (A) 04/11/2020 0153   APPEARANCEUR CLOUDY (A) 04/11/2020 0153   LABSPEC 1.024 04/11/2020 0153   PHURINE 7.0 04/11/2020 0153   GLUCOSEU NEGATIVE 04/11/2020 0153   HGBUR SMALL (A) 04/11/2020 0153   BILIRUBINUR NEGATIVE 04/11/2020 0153   KETONESUR NEGATIVE 04/11/2020 0153   PROTEINUR 100 (A) 04/11/2020 0153   NITRITE  NEGATIVE 04/11/2020 0153   LEUKOCYTESUR LARGE (A) 04/11/2020 0153    Sepsis Labs: Lactic Acid, Venous    Component Value Date/Time   LATICACIDVEN 1.0 03/09/2020 0959    MICROBIOLOGY: No results found for this or any previous visit (from the past 240 hour(s)).  RADIOLOGY STUDIES/RESULTS: No results found.   LOS: 84 days   Signature  Lala Lund M.D on 05/30/2020 at 8:32 AM   -  To page go to [email or web address]     05/30/2020, 8:32 AM

## 2020-05-30 NOTE — Progress Notes (Signed)
Physical Therapy Treatment Patient Details Name: Kenneth Sullivan MRN: [id number] DOB: Sep 28, 1956 Today's Date: 05/30/2020    History of Present Illness Pt is a 64 y.o. male admitted 03/07/20 with acute SOB; had bradycardic event with asystole/cardiac arrest, ROSC achieved in 20 minutes with 3 rounds epinephrine; intubated 3/23. Pt with massive PE/DVT and RV strain. S/p IR guided mechanical thrombectomy and IVC filter placement. Devoloped gastric ulcer s/p GDA embolization. MRI 3/27 revealed multifocal acute to subacute ischemic infarct involving bilateral cerebral hemisphere consistent with global hypoperfusion; associated scattered petechial hemorrhages with evidence of hemmorhagic conversion in R parietal lobe. S/p trach 4/5, changed to cuffless 4/22; decannulated 4/28. Liver biopsy 5/19. 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 PMH unknown.    PT Comments    Pt limited due to c/o stomach pain.  He was able to ambulate in room , but unsteady and required min guard with ambulation.  Pt with very poor safety awareness.  Attempting to sit too soon requiring mod A to get to chair.  Educated on backing to chair but continued to sit unsafely. Pt gradually demonstrates improvement but continues to have poor safety awareness and decreased cognition.  Needs 24 hr supervision/assist.  Noted SNF has denied pt.  Would need 24 hr assist at ALF recommended.     Follow Up Recommendations  Supervision/Assistance - 24 hour;Other (comment) (SNF has been denied; needs 24 hr supervision; consider ALF since SNF not available)     Equipment Recommendations  Rolling walker with 5" wheels    Recommendations for Other Services       Precautions / Restrictions Precautions Precautions: Fall;Other (comment) Precaution Comments: impulsive    Mobility  Bed Mobility Overal bed mobility: Needs Assistance Bed Mobility: Supine to Sit     Supine to  sit: Min assist;HOB elevated     General bed mobility comments: increased time; required cues for getting legs off bed then pushing up  Transfers Overall transfer level: Needs assistance Equipment used: None Transfers: Sit to/from Stand Sit to Stand: Supervision;Mod assist         General transfer comment: Performed multiple sit to stands with supervision.  Demonstrates standing safely but needs assist for sitting.  Pt attempting to sit to early and required mod A to get to chair.  Required max cues to back to chair completely.  Ambulation/Gait Ambulation/Gait assistance: Min guard Gait Distance (Feet): 30 Feet Assistive device: 1 person hand held assist;None Gait Pattern/deviations: Step-through pattern;Decreased stride length;Wide base of support;Trunk flexed     General Gait Details: Pt declined ambulation in hall due to stomach pain ; tried to encourage but continued to refuse.  Declines use of RW.  Had no LOB but required close guarding due to unsteady.   Stairs             Wheelchair Mobility    Modified Rankin (Stroke Patients Only) Modified Rankin (Stroke Patients Only) Pre-Morbid Rankin Score: No symptoms Modified Rankin: Moderately severe disability     Balance Overall balance assessment: Needs assistance Sitting-balance support: No upper extremity supported;Feet supported Sitting balance-Leahy Scale: Good     Standing balance support: During functional activity;No upper extremity supported Standing balance-Leahy Scale: Fair                              Cognition Arousal/Alertness: Awake/alert Behavior During Therapy: Impulsive Overall Cognitive Status: Impaired/Different from baseline Area of Impairment:  Attention;Following commands;Safety/judgement;Awareness;Problem solving;Memory                 Orientation Level: Disoriented to;Time;Situation;Place   Memory: Decreased short-term memory Following Commands: Follows one step  commands with increased time;Follows multi-step commands inconsistently Safety/Judgement: Decreased awareness of deficits;Decreased awareness of safety   Problem Solving: Requires tactile cues;Requires verbal cues;Slow processing General Comments: Pt reports stomach hurting; required increased encouragement to participate.  Required max cues for safety.      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  Of note pt requires assist with all meals and ADLs.      Pertinent Vitals/Pain Pain Assessment: Faces Faces Pain Scale: Hurts little more Pain Location: stomach Pain Descriptors / Indicators: Burning;Sore Pain Intervention(s): Limited activity within patient's tolerance;Monitored during session;Other (comment) (RN notified)    Home Living                      Prior Function            PT Goals (current goals can now be found in the care plan section) Acute Rehab PT Goals Patient Stated Goal: not stated PT Goal Formulation: With patient Time For Goal Achievement: 06/02/20 Potential to Achieve Goals: Fair Progress towards PT goals: Progressing toward goals    Frequency    Min 2X/week      PT Plan Current plan remains appropriate    Co-evaluation              AM-PAC PT "6 Clicks" Mobility   Outcome Measure  Help needed turning from your back to your side while in a flat bed without using bedrails?: A Little Help needed moving from lying on your back to sitting on the side of a flat bed without using bedrails?: A Little Help needed moving to and from a bed to a chair (including a wheelchair)?: A Little Help needed standing up from a chair using your arms (e.g., wheelchair or bedside chair)?: None Help needed to walk in hospital room?: A Little Help needed climbing 3-5 steps with a railing? : A Lot 6 Click Score: 18    End of Session Equipment Utilized  During Treatment: Gait belt Activity Tolerance: Patient tolerated treatment well Patient left: with call bell/phone within reach;with chair alarm set;in chair;with nursing/sitter in room Nurse Communication: Mobility status PT Visit Diagnosis: Unsteadiness on feet (R26.81);Other abnormalities of gait and mobility (R26.89)     Time: 1340-1400 PT Time Calculation (min) (ACUTE ONLY): 20 min  Charges:  $Gait Training: 8-22 mins                     Abran Richard, PT Acute Rehab Services Pager [phone] [facility] [place] 05/30/2020, 2:10 PM

## 2020-05-31 LAB — CBC
HCT: 30.2 % — ABNORMAL LOW (ref 39.0–52.0)
Hemoglobin: 9.7 g/dL — ABNORMAL LOW (ref 13.0–17.0)
MCH: 28.3 pg (ref 26.0–34.0)
MCHC: 32.1 g/dL (ref 30.0–36.0)
MCV: 88 fL (ref 80.0–100.0)
Platelets: 342 10*3/uL (ref 150–400)
RBC: 3.43 MIL/uL — ABNORMAL LOW (ref 4.22–5.81)
RDW: 16.8 % — ABNORMAL HIGH (ref 11.5–15.5)
WBC: 11.8 10*3/uL — ABNORMAL HIGH (ref 4.0–10.5)
nRBC: 0 % (ref 0.0–0.2)

## 2020-05-31 MED ORDER — SODIUM CHLORIDE 0.9 % IV BOLUS
250.0000 mL | Freq: Once | INTRAVENOUS | Status: AC
  Administered 2020-05-31: 250 mL via INTRAVENOUS

## 2020-05-31 NOTE — TOC Progression Note (Addendum)
Transition of Care [facility]) - Progression Note    Patient Details  Name: Kenneth Sullivan MRN: [id number] Date of Birth: October 09, 1956  Transition of Care [facility] CM/SW [place], RN Phone Number: [phone]  05/31/2020, 2:51 PM  Clinical Narrative:   MD called CM to request that address for [place] be added to a note for oncologist to view because the [facility] may be able to assist in transporting patient to treatments. The ALF that is considering accepting patient is [place] [address]. CM has called [facility] to ask if they are still able to accept patient if transportation is set up. Message left and will await return call.   05/31/20 1550 Spoke with Herbert Spires at [facility] who states that they can offer patient a bed at ALF if transport is set up for chemo treatments. CM will follow up with MD.   Expected Discharge Plan: [place] Barriers to Discharge: No SNF bed  Expected Discharge Plan and Services Expected Discharge Plan: [place] In-house Referral: Clinical Social Work     Living arrangements for the past 2 months: Apartment                                       Social Determinants of Health (SDOH) Interventions    Readmission Risk Interventions No flowsheet data found.

## 2020-05-31 NOTE — Progress Notes (Signed)
PROGRESS NOTE    Kionte Baumgardner  HYI:[id number] DOB: 03-Aug-1956 DOA: 03/07/2020 PCP: Medicine, [name]   Brief Narrative: 64 year old with no prior medical history who had acute shortness of breath respiratory failure oxygen saturation in the 40s on EMS arrival.  He had a witnessed bradycardic, asystole arrest when EMS was present with 3 rounds of epi, 20 minutes of CPR prior to [place]. Underwent normothermia protocol.  Work-up was significant for massive PE, DVT with right ventricular strain.  Unfortunately hospital course was complicated by upper GI bleed due to gastric ulcer.  A CT abdomen pelvis showed a large antral mass and multiple liver lesions biopsy confirmed a high-grade neuroendocrine tumor, subsequent EGD with biopsy revealing adenocarcinoma.  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   For his massive PE patient underwent IR guided mechanical thrombectomy and IVC filter.  Post procedure, while on heparin drip patient is started having maroon-colored red stool and bloody drainage from OGT.  GI did endoscopy which showed oozing gastric ulcer with clots.  IR did GDA embolization.  Patient remained encephalopathic. On 3/27, MRI of the brain showed multifocal acute to subacute ischemic infarct involving bilateral cerebral hemisphere consistent with global hypoperfusion related to cardiac arrest, also associated with scatter petechial hemorrhage with evidence of hemorrhagic conversion in the parietal lobe. Abdomen and pelvis showed gastric lesion in the antrum concerning for antral mass and multiple liver lesion concerning for metastatic disease IR consulted and he underwent liver biopsy on 5/19.  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  Currently stable on room air.  2-Cardiac arrest: Secondary to massive PE On telemetry. Patient is not a long-term anticoagulation candidate given large gastric ulcer, secondary to neuroendocrine tumor given risk for massive GI bleed. Also history of a stroke with hemorrhagic conversion.  3-Massive PE with DVT status post IVC filter: Patient is not a long-term anticoagulation candidate given large gastric ulcer, secondary to neuroendocrine tumor given risk for massive GI bleed. Status post IVC filter placement on mechanical thrombectomy.  4-anoxic encephalopathy: Pleasantly confused, answer few questions.  5-Upper GI bleed secondary to large malignant gastric ulcer with acute blood loss anemia Underwent endoscopy x2.  Subsequently embolization of GDA by IR Continue with PPI. Endoscopy showed adenocarcinoma. Patient report bloody stool last night.  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  The facility is not able to provide transportation to the cancer center for chemotherapy.  Discussed with Dr. Learta Codding consult Center might be able to provide with transportation. -We will follow up with Dr. Benay Spice tomorrow to see if cancer center will be able to provide transportation. - Dysphagia: Currently on regular diet  Delusion hallucination: Psych consulted.  At times he  gets agitated.  He is  Improved. Continue with Seroquel Severe malnutrition: Etiology chronic illness gastric lesion, multiple evaluation.  Continue with Ensure   Nutrition Problem: Severe Malnutrition Etiology: chronic illness (gastric lesion, multiple liver lesions, concern for metastatic disease)    Signs/Symptoms: severe fat depletion, severe muscle depletion    Interventions: Ensure Enlive (each supplement provides 350kcal and 20 grams of protein), Magic cup  Estimated body mass index is 17.97 kg/m as calculated from the following:   Height as of this encounter: 6\' 2"  (1.88 m).   Weight as of this encounter: 63.5 kg.   DVT prophylaxis: SCDs Code Status: DNR Family Communication: Discussed with patient Disposition Plan:  Status is: Inpatient  Remains inpatient appropriate because:Awaiting placement   Dispo: The patient is from: Home              Anticipated d/c is to: ALF              Anticipated d/c date is: 1 day              Patient currently is medically stable to d/c.  Discussed with Dr. Learta Codding, he will see if cancer center can help with transportation for chemotherapy        Consultants:  [facility] Oncology Palliative  Procedures:  Significant events: 3/23>> Admit, EGD with findings of oozing gastric ulcer with clot, IR for mechanical thrombectomy, IVC filter and GDA embolization 4/5>>Trach 4/28>>trach decannulated  Significant studies: CTA 3/23 >> bilateral PE with RV/with ratio 1.9, emphysema CT head 3/23 >>chronic microvascular changes. No acute findings Echo 3/23 >> RV is severely dilated with reduced function and D-shaped septum, LVEF 60-65%. Venogram 3/23>> occlusive DVT of the iliac vein extending to the lower IVC LE Venous Duplex 3/24 >>acute DVT in BLE up to the femoral vein  MRI Brain 3/28 >> multifocal acute to subacute ischemic infarcts involving bilateral cerebral hemispheres, findings consistent with global hypoperfusion event related  to cardiac arrest, associated scattered petechial hemorrhage about many areas of ischemia, evidence of hemorrhagic conversion in the right parietal lobe   Antimicrobials:  Doxycycline from 4/9 on 12/17  Subjective: He denies abdominal pain.  He report of bloody stool last night.  Objective: Vitals:   05/30/20 1708 05/30/20 1900 05/31/20 0500 05/31/20 0817  BP: 111/78 111/80  109/79  Pulse: (!) 106 (!) 115  (!) 107  Resp: 18 18  18   Temp: 97.6 F (36.4 C) 99.3 F (37.4 C)  98.3 F (36.8 C)  TempSrc:  Oral    SpO2: 100% 98%  94%  Weight:   63.5 kg   Height:        Intake/Output Summary (Last 24 hours) at 05/31/2020 0941 Last data filed at 05/30/2020 2100 Gross per 24 hour  Intake 240 ml  Output --  Net 240 ml   Filed Weights   05/01/20 0648 05/30/20 0500 05/31/20 0500  Weight: 63 kg 67.6 kg 63.5 kg    Examination:  General exam: Appears calm and comfortable  Respiratory system: Clear to auscultation. Respiratory effort normal. Cardiovascular system: S1 & S2 heard, RRR. No JVD, murmurs, rubs, gallops or clicks. No pedal edema. Gastrointestinal system: Abdomen is nondistended, soft and nontender. No organomegaly or masses felt. Normal bowel sounds heard. Central nervous system: Alert and oriented. No focal neurological deficits. Extremities: Symmetric 5 x 5 power. Skin: No rashes, lesions or ulcers Psychiatry: Judgement and insight appear normal. Mood & affect appropriate.     Data Reviewed: I have personally reviewed following labs and  imaging studies  CBC: Recent Labs  Lab 05/28/20 2353  WBC 7.7  HGB 8.9*  HCT 27.6*  MCV 89.0  PLT 235   Basic Metabolic Panel: Recent Labs  Lab 05/28/20 2353  NA 139  K 3.7  CL 107  CO2 24  GLUCOSE 98  BUN 12  CREATININE 0.85  CALCIUM 8.7*  MG 1.7   GFR: Estimated Creatinine Clearance: 80.9 mL/min (by C-G formula based on SCr of 0.85 mg/dL). Liver Function Tests: Recent Labs  Lab 05/28/20 2353  AST 18  ALT  10  ALKPHOS 66  BILITOT 0.4  PROT 5.3*  ALBUMIN 2.4*   No results for input(s): LIPASE, AMYLASE in the last 168 hours. No results for input(s): AMMONIA in the last 168 hours. Coagulation Profile: No results for input(s): INR, PROTIME in the last 168 hours. Cardiac Enzymes: No results for input(s): CKTOTAL, CKMB, CKMBINDEX, TROPONINI in the last 168 hours. BNP (last 3 results) No results for input(s): PROBNP in the last 8760 hours. HbA1C: No results for input(s): HGBA1C in the last 72 hours. CBG: Recent Labs  Lab 05/26/20 1155  GLUCAP 104*   Lipid Profile: No results for input(s): CHOL, HDL, LDLCALC, TRIG, CHOLHDL, LDLDIRECT in the last 72 hours. Thyroid Function Tests: No results for input(s): TSH, T4TOTAL, FREET4, T3FREE, THYROIDAB in the last 72 hours. Anemia Panel: No results for input(s): VITAMINB12, FOLATE, FERRITIN, TIBC, IRON, RETICCTPCT in the last 72 hours. Sepsis Labs: No results for input(s): PROCALCITON, LATICACIDVEN in the last 168 hours.  Recent Results (from the past 240 hour(s))  SARS CORONAVIRUS 2 (TAT 6-24 HRS) Nasopharyngeal Nasopharyngeal Swab     Status: None   Collection Time: 05/30/20 11:25 AM   Specimen: Nasopharyngeal Swab  Result Value Ref Range Status   SARS Coronavirus 2 NEGATIVE NEGATIVE Final    Comment: (NOTE) SARS-CoV-2 target nucleic acids are NOT DETECTED.  The SARS-CoV-2 RNA is generally detectable in upper and lower respiratory specimens during the acute phase of infection. Negative results do not preclude SARS-CoV-2 infection, do not rule out co-infections with other pathogens, and should not be used as the sole basis for treatment or other patient management decisions. Negative results must be combined with clinical observations, patient history, and epidemiological information. The expected result is Negative.  Fact Sheet for Patients: SugarRoll.be  Fact Sheet for Healthcare  Providers: https://www.woods-mathews.com/  This test is not yet approved or cleared by the [place] FDA and  has been authorized for detection and/or diagnosis of SARS-CoV-2 by FDA under an Emergency Use Authorization (EUA). This EUA will remain  in effect (meaning this test can be used) for the duration of the COVID-19 declaration under Se ction 564(b)(1) of the Act, 21 U.S.C. section 360bbb-3(b)(1), unless the authorization is terminated or revoked sooner.  Performed at [facility], [place] [address] [place], [place] [address]          Radiology Studies: No results found.      Scheduled Meds: . clonazePAM  0.5 mg Oral BID  . feeding supplement (ENSURE ENLIVE)  237 mL Oral QID  . FLUoxetine  10 mg Oral QHS  . OXcarbazepine  75 mg Oral BID  . pantoprazole  40 mg Oral BID  . polyethylene glycol  17 g Oral Daily  . QUEtiapine  100 mg Oral QHS  . QUEtiapine  75 mg Oral Daily   Continuous Infusions: . sodium chloride       LOS: 85 days    Time spent: 35  minutes.     Elmarie Shiley, MD [facility]   If 7PM-7AM, please contact night-coverage [email or web address]  05/31/2020, 9:41 AM

## 2020-05-31 NOTE — Progress Notes (Signed)
OT Cancellation Note  Patient Details Name: Kenneth Sullivan MRN: [id number] DOB: 01-02-1956   Cancelled Treatment:    Reason Eval/Treat Not Completed: Patient declined, no reason specified;Fatigue/lethargy limiting ability to participate Pt adamantly declined therapy this session. Pt stated he was in "too much pain" and "felt like a bum". Pt typically motivated to participate with ice cream or cookies. Pt declined both this session after multiple offers. Pt stated "I know when I'm really not feeling good, 'cause it's the only time I'll turn down sweets". Will continue to follow as time allows and pt is appropriate.   [facility] OTR/L Acute Rehabilitation Services Office: [place] 05/31/2020, 4:45 PM

## 2020-05-31 NOTE — Progress Notes (Signed)
Palliative Medicine RN Note: Discussed pt with team. [place] are clear. Barrier to discharge is disposition, and there is no further role for our involvement.  At this time, we will sign off. If the family or patient asks to speak to [place] again to discuss [place] or change in treatment plan, please re-consult [place].  Marjie Skiff Isaah Furry, RN, BSN, [facility] Palliative Medicine Team 05/31/2020 11:55 AM Office [phone]

## 2020-06-01 ENCOUNTER — Inpatient Hospital Stay (HOSPITAL_COMMUNITY): Payer: Medicaid Other

## 2020-06-01 LAB — URINALYSIS, ROUTINE W REFLEX MICROSCOPIC
Bilirubin Urine: NEGATIVE
Glucose, UA: NEGATIVE mg/dL
Hgb urine dipstick: NEGATIVE
Ketones, ur: NEGATIVE mg/dL
Nitrite: NEGATIVE
Protein, ur: NEGATIVE mg/dL
Specific Gravity, Urine: 1.025 (ref 1.005–1.030)
WBC, UA: >50 WBC/hpf — ABNORMAL HIGH (ref 0–5)
pH: 5 (ref 5.0–8.0)

## 2020-06-01 LAB — LACTIC ACID, PLASMA: Lactic Acid, Venous: 1.6 mmol/L (ref 0.5–1.9)

## 2020-06-01 MED ORDER — SODIUM CHLORIDE 0.9 % IV BOLUS
1000.0000 mL | Freq: Once | INTRAVENOUS | Status: AC
  Administered 2020-06-01: 1000 mL via INTRAVENOUS

## 2020-06-01 MED ORDER — SODIUM CHLORIDE 0.9 % IV BOLUS
500.0000 mL | Freq: Once | INTRAVENOUS | Status: AC
  Administered 2020-06-01: 500 mL via INTRAVENOUS

## 2020-06-01 MED ORDER — SODIUM CHLORIDE 0.9 % IV SOLN
1.0000 g | INTRAVENOUS | Status: DC
  Administered 2020-06-01: 1 g via INTRAVENOUS
  Filled 2020-06-01: qty 10
  Filled 2020-06-01: qty 1

## 2020-06-01 MED ORDER — SODIUM CHLORIDE 0.9 % IV SOLN: INTRAVENOUS | Status: DC

## 2020-06-01 NOTE — TOC Progression Note (Signed)
Transition of Care [facility]) - Progression Note    Patient Details  Name: Demico Ploch MRN: [id number] Date of Birth: 1956/11/19  Transition of [facility] CM/SW [place], RN Phone Number: [phone]  06/01/2020, 2:14 PM  Clinical Narrative: CM spoke with MD who states that patient is not medically ready for d/c due to new dx UTI and will start on IV antibiotics CM has emailed [facility] [place] under the advice of Nathaniel Man for information on setting up transport from ALF to [facility] for chemo treatments. CM spoke with Niece [name] and has updated her on the plan. CM will continue to follow.       Expected Discharge Plan: Skilled Nursing Facility Barriers to Discharge: No SNF bed  Expected Discharge Plan and Services Expected Discharge Plan: [place] In-house Referral: Clinical Social Work     Living arrangements for the past 2 months: Apartment                                       Social Determinants of Health (SDOH) Interventions    Readmission Risk Interventions No flowsheet data found.

## 2020-06-01 NOTE — Significant Event (Signed)
Rapid Response Event Note  Overview: "Second Set of Eyes"   Initial Focused Assessment: I went to the see patient at the nurse's request. Per nurse, today patient has had fever on and off, he has been altered/confused, tachycardiac with soft BPs. Upon arrival, patient was awake and oriented to self/place, disoriented to time/situation - pleasantly confused with periods of erratic conversations. He appears to be hallucinating at times. I placed my hand on his forehead - he was very hot to touch. Skin is dry, + pulses. Lung sounds - clear bilaterally throughout - good air movement - RR normal - breathing comfortably on 2L [place]. Patient was able to follow my commands - able to squeeze hands bilaterally and able to move legs bilaterally as well.   VS: Temp - 103.1 (O), HR 113, 106/70(83), 99% on 2L [place], RR 18-20. Per nurse - patient has thick yellow secretions that he sometimes can cough up.   He is receiving his 3rd bolus of NS today - total after this bolus will be 2L. UA/UC, Bx, and CXR have been completed and ABX were given.   Per nurse - today the patient has not been his "usual" self - normally he is up and in the hallway, today he has not been out of bed, has not eaten much, and has been refusing lab draws. I was able to calm him down and he allowed staff to the draw the labs.  Interventions: -- No RRT Interventions  Plan of Care: -- Treat temperature as patient allows - nurse given APAP now -- Monitor VS -- Encourage patient with meals and fluids -- [place] Precautions   Event Summary:  Call Time 1727 Arrival Time 1730 End Time 1800   Jaclyn Andy R

## 2020-06-01 NOTE — Progress Notes (Signed)
Pt removed PIV, refused placement of a new PIV.

## 2020-06-01 NOTE — Progress Notes (Signed)
Occupational Therapy Treatment Patient Details Name: Kenneth Sullivan MRN: [id number] DOB: 06-Feb-1956 Today's Date: 06/01/2020    History of present illness Pt is a 64 y.o. male admitted 03/07/20 with acute SOB; had bradycardic event with asystole/cardiac arrest, ROSC achieved in 20 minutes with 3 rounds epinephrine; intubated 3/23. Pt with massive PE/DVT and RV strain. S/p IR guided mechanical thrombectomy and IVC filter placement. Devoloped gastric ulcer s/p GDA embolization. MRI 3/27 revealed multifocal acute to subacute ischemic infarct involving bilateral cerebral hemisphere consistent with global hypoperfusion; associated scattered petechial hemorrhages with evidence of hemmorhagic conversion in R parietal lobe. S/p trach 4/5, changed to cuffless 4/22; decannulated 4/28. Liver biopsy 5/19. 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 PMH unknown.   OT comments  Pt agreeable to wash up at the sink. Pt required modA to stand from EOB this session and modA for stability while standing at sink level without UE support while he completed pericare. Pt required modA for navigating environment at [place] level. Pt will continue to benefit from skilled OT services to maximize safety and independence with ADL/IADL and functional mobility. Will continue to follow acutely and progress as tolerated.    Follow Up Recommendations  SNF;Supervision/Assistance - 24 hour    Equipment Recommendations  Other (comment) (TBD at next venue)    Recommendations for Other Services      Precautions / Restrictions Precautions Precautions: Fall;Other (comment) Precaution Comments: impulsive Restrictions Weight Bearing Restrictions: No       Mobility Bed Mobility Overal bed mobility: Needs Assistance Bed Mobility: Supine to Sit;Sit to Supine     Supine to sit: Min guard;HOB elevated Sit to supine: Min guard;HOB elevated   General bed mobility  comments: increased time and effort, minguard for safety  Transfers Overall transfer level: Needs assistance Equipment used: Rolling walker (2 wheeled) Transfers: Sit to/from Stand Sit to Stand: Mod assist         General transfer comment: modA to stand from slightly elevated surface    Balance Overall balance assessment: Needs assistance Sitting-balance support: No upper extremity supported Sitting balance-Leahy Scale: Good     Standing balance support: During functional activity;No upper extremity supported Standing balance-Leahy Scale: Poor Standing balance comment: required assistance from therapist for support in standing without UE support                           ADL either performed or assessed with clinical judgement   ADL Overall ADL's : Needs assistance/impaired     Grooming: Moderate assistance;Standing Grooming Details (indicate cue type and reason): modA for stability and to locate items at sink level                 Toilet Transfer: Moderate assistance;Ambulation;RW Toilet Transfer Details (indicate cue type and reason): max cues to navigate in room Toileting- Clothing Manipulation and Hygiene: Moderate assistance;Sit to/from stand Toileting - Clothing Manipulation Details (indicate cue type and reason): modA for stability while pt completed pericare in standing     Functional mobility during ADLs: Moderate assistance;Cueing for safety;Cueing for sequencing;Rolling walker General ADL Comments: modA for multimodal cues for navigating environment;pt required directional cues to locate sink and items at sink level     Vision   Vision Assessment?: Vision impaired- to be further tested in functional context Additional Comments: difficult to assess due to cognition   Perception     Praxis  Cognition Arousal/Alertness: Awake/alert Behavior During Therapy: Impulsive Overall Cognitive Status: Impaired/Different from baseline Area of  Impairment: Attention;Following commands;Safety/judgement;Awareness;Problem solving;Memory                   Current Attention Level: Sustained Memory: Decreased short-term memory Following Commands: Follows one step commands with increased time;Follows multi-step commands inconsistently Safety/Judgement: Decreased awareness of deficits;Decreased awareness of safety Awareness: Intellectual Problem Solving: Requires tactile cues;Requires verbal cues;Slow processing General Comments: pt requires max multimodal cues for sequencing and navigating environment;pt easily distractable by his own thoughts;pt with tangential thinking        Exercises     Shoulder Instructions       General Comments vss    Pertinent Vitals/ Pain       Pain Assessment: Faces Faces Pain Scale: Hurts little more Pain Location: stomach Pain Descriptors / Indicators: Sore Pain Intervention(s): Limited activity within patient's tolerance;Monitored during session  Home Living                                          Prior Functioning/Environment              Frequency  Min 2X/week        Progress Toward Goals  OT Goals(current goals can now be found in the care plan section)  Progress towards OT goals: Progressing toward goals  Acute Rehab OT Goals Patient Stated Goal: to feel better OT Goal Formulation: With patient Time For Goal Achievement: 06/15/20 Potential to Achieve Goals: Fair ADL Goals Pt Will Perform Eating: with min assist;sitting Pt Will Perform Grooming: with min assist;standing Pt Will Perform Upper Body Bathing: with min assist;sitting Pt Will Perform Lower Body Bathing: with min assist;sit to/from stand Pt Will Perform Upper Body Dressing: with min assist;sitting Pt Will Perform Lower Body Dressing: with min assist;sit to/from stand Pt Will Transfer to Toilet: with min guard assist;ambulating;regular height toilet;grab bars Pt Will Perform Toileting  - Clothing Manipulation and hygiene: with min guard assist;sit to/from stand  Plan Discharge plan remains appropriate    Co-evaluation                 AM-PAC OT "6 Clicks" Daily Activity     Outcome Measure   Help from another person eating meals?: A Lot Help from another person taking care of personal grooming?: A Lot Help from another person toileting, which includes using toliet, bedpan, or urinal?: A Lot Help from another person bathing (including washing, rinsing, drying)?: A Lot Help from another person to put on and taking off regular upper body clothing?: A Lot Help from another person to put on and taking off regular lower body clothing?: A Lot 6 Click Score: 12    End of Session Equipment Utilized During Treatment: Gait belt;Rolling walker  OT Visit Diagnosis: Unsteadiness on feet (R26.81);Other abnormalities of gait and mobility (R26.89);Muscle weakness (generalized) (M62.81);Low vision, both eyes (H54.2);Other symptoms and signs involving cognitive function   Activity Tolerance Patient tolerated treatment well   Patient Left in bed;with call bell/phone within reach;with bed alarm set   Nurse Communication Mobility status        Time: 1191-4782 OT Time Calculation (min): 12 min  Charges: OT General Charges $OT Visit: 1 Visit OT Treatments $Self Care/Home Management : 8-22 mins  Helene Kelp OTR/L Acute Rehabilitation Services Office: [place] 06/01/2020, 3:43 PM

## 2020-06-01 NOTE — Progress Notes (Addendum)
HEMATOLOGY-ONCOLOGY PROGRESS NOTE  SUBJECTIVE: Developed fever up to 101.6 last night.  Having tachycardia and low blood pressures this afternoon.  More agitated this morning.  Resting quietly this afternoon at the time my visit.  PHYSICAL EXAMINATION:  Vitals:   06/01/20 1432 06/01/20 1440  BP:  (!) 87/55  Pulse:  (!) 125  Resp:  14  Temp: 99.5 F (37.5 C)   SpO2:  (!) 88%   Filed Weights   05/30/20 0500 05/31/20 0500 06/01/20 0254  Weight: 67.6 kg 63.5 kg 66.7 kg    Intake/Output from previous day: 06/16 0701 - 06/17 0700 In: 490 [P.O.:240; IV Piggyback:250] Out: 200 [Urine:200]  GENERAL:alert, no distress and comfortable ABDOMEN:abdomen soft, non-tender and normal bowel sounds  NEURO: Increased agitation  LABORATORY DATA:  I have reviewed the data as listed CMP Latest Ref Rng & Units 05/28/2020 05/22/2020 05/18/2020  Glucose 70 - 99 mg/dL 98 91 94  BUN 8 - 23 mg/dL _0 Creatinine 0.61 - 1.24 mg/dL 0.85 0.90 0.89  Sodium 135 - 145 mmol/L 139 139 140  Potassium 3.5 - 5.1 mmol/L 3.7 4.0 4.2  Chloride 98 - 111 mmol/L 107 108 107  CO2 22 - 32 mmol/L _1 Calcium 8.9 - 10.3 mg/dL 8.7(L) 8.5(L) 8.5(L)  Total Protein 6.5 - 8.1 g/dL 5.3(L) 5.0(L) -  Total Bilirubin 0.3 - 1.2 mg/dL 0.4 0.2(L) -  Alkaline Phos 38 - 126 U/L 66 60 -  AST 15 - 41 U/L 18 16 -  ALT 0 - 44 U/L 10 12 -    Lab Results  Component Value Date   WBC 11.8 (H) 05/31/2020   HGB 9.7 (L) 05/31/2020   HCT 30.2 (L) 05/31/2020   MCV 88.0 05/31/2020   PLT 342 05/31/2020   NEUTROABS 4.6 05/10/2020    US BIOPSY (LIVER)  Result Date: 05/03/2020 INDICATION: 64 year old male with multiple liver lesions and concern for metastatic disease EXAM: ULTRASOUND-GUIDED BIOPSY LEFT LIVER MASS MEDICATIONS: None. ANESTHESIA/SEDATION: Moderate (conscious) sedation was employed during this procedure. A total of Versed 2.0 mg and Fentanyl 100 mcg was administered intravenously. Moderate Sedation Time: 10 minutes. The  patient's level of consciousness and vital signs were monitored continuously by radiology nursing throughout the procedure under my direct supervision. FLUOROSCOPY TIME:  None). COMPLICATIONS: None PROCEDURE: Informed written consent was obtained from the patient after a thorough discussion of the procedural risks, benefits and alternatives. All questions were addressed. Maximal Sterile Barrier Technique was utilized including caps, mask, sterile gowns, sterile gloves, sterile drape, hand hygiene and skin antiseptic. A timeout was performed prior to the initiation of the procedure Ultrasound survey of the bilateral liver lobe performed with images stored and sent to PACs. The subxiphoid region of the abdomen was prepped with chlorhexidine in a sterile fashion, and a sterile drape was applied covering the operative field. A sterile gown and sterile gloves were used for the procedure. Local anesthesia was provided with 1% Lidocaine. Once the patient is prepped and draped sterilely and the skin and subcutaneous tissues were generously infiltrated with 1% lidocaine. A 17 gauge introducer needle was advanced into the left liver lobe targeting heterogeneously echoic mass of the left liver. The stylet was removed, and multiple 18 gauge core biopsy were retrieved. Samples were placed into formalin for transportation to the lab. Gel-Foam pledgets were then infused with a small amount of saline for assistance with hemostasis. The needle was removed, and a final ultrasound image was performed. The patient tolerated  the procedure well and remained hemodynamically stable throughout. No complications were encountered and no significant blood loss was encounter. IMPRESSION: Status post ultrasound-guided biopsy of left liver mass. Signed, Dulcy Fanny. Dellia Nims, RPVI [facility] Radiology Electronically Signed   By: Corrie Mckusick D.O.   On: 05/03/2020 16:16   DG CHEST PORT 1  VIEW  Result Date: 06/01/2020 CLINICAL DATA:  History of previous pulmonary embolism with right ventricular strain, cough and fevers EXAM: PORTABLE CHEST 1 VIEW COMPARISON:  04/04/2020 FINDINGS: Cardiac shadow is stable. Tracheostomy tube and feeding catheter has been removed in the interval. Lungs are well aerated bilaterally. No focal infiltrate or sizable effusion is seen. Focal nodular density is again noted in the left upper lobe consistent with dilated pulmonary arterial branch. IMPRESSION: No acute abnormality noted. Electronically Signed   By: Inez Catalina M.D.   On: 06/01/2020 10:52    ASSESSMENT AND PLAN: 1.    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 2.  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  Tracheostomy placement on 03/20/2020, now healed 4.  Cardiac arrest on 03/07/2020 5.  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  History of acute blood loss anemia likely due to upper GI bleed 7.  Delusions/hallucinations  Mr. Bueche has now developed fevers.  Blood cultures and urinalysis ordered per hospitalist.  UA suggestive of UTI.  Has been started on IV antibiotics.  Disposition planning indicates that the patient may go to ALF at [facility].  However, they cannot transport for chemotherapy.  Our office can arrange for transportation if the patient can get in and out of the vehicle independently.  We will  have GI navigator follow-up on additional testing including PD-L1, HER-2, and foundation 1.  Recommendations: 1.  Treat acute infection per hospitalist. 2.  I have reached out to our GI navigator to follow-up with pathology regarding PD-L1, HER-2, and foundation 1 testing. 3.  We will arrange for transportation to the cancer center from ALF.  Unclear as to discharge date at this time.   LOS: 86 days   Mikey Bussing, [place], AGPCNP-BC, AOCNP 06/01/20 Mr. Anstead appeared agitated when I saw him this morning.  He appears to have developed a urinary tract infection.  He is now on antibiotics I am told we can arrange for transportation to the cancer center from the assisted living facility.  I will check on him tomorrow and we will plan for outpatient chemotherapy at the Cancer center.

## 2020-06-01 NOTE — Progress Notes (Signed)
Patient lost his IV after bolus ordered. IV obtained by IV team and bolus given. Patient refused all labs this afternoon including Lactic acid. Will continue to monitor patient.

## 2020-06-01 NOTE — Progress Notes (Addendum)
PROGRESS NOTE    Kenneth Sullivan  [name] DOB: [birth year] DOA: 03/07/2020 PCP: Medicine, [name]   Brief Narrative: 64 year old with no prior medical history who had acute shortness of breath respiratory failure oxygen saturation in the 40s on EMS arrival.  He had a witnessed bradycardic, asystole arrest when EMS was present with 3 rounds of epi, 20 minutes of CPR prior to [place]. Underwent normothermia protocol.  Work-up was significant for massive PE, DVT with right ventricular strain.  Unfortunately hospital course was complicated by upper GI bleed due to gastric ulcer.  A CT abdomen pelvis showed a large antral mass and multiple liver lesions biopsy confirmed a high-grade neuroendocrine tumor, subsequent EGD with biopsy revealing adenocarcinoma.  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   For his massive PE patient underwent IR guided mechanical thrombectomy and IVC filter.  Post procedure, while on heparin drip patient is started having maroon-colored red stool and bloody drainage from OGT.  GI did endoscopy which showed oozing gastric ulcer with clots.  IR did GDA embolization.  Patient remained encephalopathic. On 3/27, MRI of the brain showed multifocal acute to subacute ischemic infarct involving bilateral cerebral hemisphere consistent with global hypoperfusion related to cardiac arrest, also associated with scatter petechial hemorrhage with evidence of hemorrhagic conversion in the parietal lobe. Abdomen and pelvis showed gastric lesion in the antrum concerning for antral mass and multiple liver lesion concerning for metastatic disease IR consulted and he underwent liver biopsy on 5/19.  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   Assessment & Plan:   Principal Problem:   Cardiac arrest [name] Active Problems:   Encounter for central line placement   GI bleed   Acute respiratory  failure (HCC)   Anoxic encephalopathy (HCC)   Cerebral embolism with cerebral infarction   Pulmonary emboli ([name])   Palliative care by specialist   Goals of care, counseling/discussion   DNR (do not resuscitate)   Status post tracheostomy ([name])   Protein-calorie malnutrition, severe   Neuroendocrine cancer ([name])   1-Acute hypoxic respiratory failure in the setting of massive PE/cardiac arrest: Managed in the ICU with status post tracheostomy subsequently decannulated.  Currently stable on room air.  2-Cardiac arrest: Secondary to massive PE On telemetry. Patient is not a long-term anticoagulation candidate given large gastric ulcer, secondary to neuroendocrine tumor given risk for massive GI bleed. Also history of a stroke with hemorrhagic conversion.  3-Massive PE with DVT status post IVC filter: Patient is not a long-term anticoagulation candidate given large gastric ulcer, secondary to neuroendocrine tumor given risk for massive GI bleed. Status post IVC filter placement on mechanical thrombectomy.  4-anoxic encephalopathy: Pleasantly confused, answer few questions.  5-Upper GI bleed secondary to large malignant gastric ulcer with acute blood loss anemia Underwent endoscopy x2.  Subsequently embolization of GDA by IR Continue with PPI. Endoscopy showed adenocarcinoma. Patient report bloody stool last night.  Repeated hemoglobin is stable  6-Gastric adenocarcinoma with neuroendocrine component that has metastasized to the liver; -Patient was accepted at assisted living facility here in [place].  The facility is not able to provide transportation to the cancer center for chemotherapy.  Discussed with Dr. Learta Codding consult Center might be able to provide with transportation. -We will follow up with Dr. Benay Spice tomorrow to see if cancer center will be able to provide transportation. - UTI;  Patient develops fever, mild leukocytosis.  Blood culture ordered.  UA with more than  50 WBC.  Started ceftriaxone.   Tachycardia; related to fever. Hypovolemia. Hypotension IV bolus.  Tylenol.  Called by nurse; BP 88, oxygen sat 88. Came to see patient, he denies dyspnea, chest pain.  Will proceed with another IV bolus. \ 1 L.  Check lactic acid Dysphagia: Currently on regular diet  Delusion hallucination: Psych consulted.  At times he gets agitated.  He is  Improved. Continue with Seroquel Severe malnutrition: Etiology chronic illness gastric lesion, multiple evaluation.  Continue with Ensure   Nutrition Problem: Severe Malnutrition Etiology: chronic illness (gastric lesion, multiple liver lesions, concern for metastatic disease)    Signs/Symptoms: severe fat depletion, severe muscle depletion    Interventions: Ensure Enlive (each supplement provides 350kcal and 20 grams of protein), Magic cup  Estimated body mass index is 18.87 kg/m as calculated from the following:   Height as of this encounter: 6\' 2"  (1.88 m).   Weight as of this encounter: 66.7 kg.   DVT prophylaxis: SCDs Code Status: DNR Family Communication: Discussed with patient Disposition Plan:  Status is: Inpatient  Remains inpatient appropriate because:Awaiting placement   Dispo: The patient is from: Home              Anticipated d/c is to: ALF              Anticipated d/c date is: 1 day              Patient currently is not medically stable to d/c. Patient with fever, tachycardia, treating for UTI.   Discussed with Dr. Learta Codding, he will see if cancer center can help with transportation for chemotherapy        Consultants:  [facility] Oncology Palliative  Procedures:  Significant events: 3/23>> Admit, EGD with findings of oozing gastric ulcer with clot, IR for mechanical thrombectomy, IVC filter and GDA embolization 4/5>>Trach 4/28>>trach decannulated  Significant studies: CTA 3/23 >> bilateral PE with RV/with ratio 1.9, emphysema CT head 3/23 >>chronic microvascular changes.  No acute findings Echo 3/23 >> RV is severely dilated with reduced function and D-shaped septum, LVEF 60-65%. Venogram 3/23>> occlusive DVT of the iliac vein extending to the lower IVC LE Venous Duplex 3/24 >>acute DVT in BLE up to the femoral vein  MRI Brain 3/28 >> multifocal acute to subacute ischemic infarcts involving bilateral cerebral hemispheres, findings consistent with global hypoperfusion event related to cardiac arrest, associated scattered petechial hemorrhage about many areas of ischemia, evidence of hemorrhagic conversion in the right parietal lobe   Antimicrobials:  Doxycycline from 4/9 on 12/17  Subjective: He is alert, he has been coughing.  Denies abdominal pain.   Objective: Vitals:   06/01/20 0631 06/01/20 0720 06/01/20 1102 06/01/20 1300  BP:  117/76  (!) 94/55  Pulse:  (!) 112 (!) 134 (!) 132  Resp:  16 15 18   Temp: 100.1 F (37.8 C) (!) 101.4 F (38.6 C) 99.5 F (37.5 C) 100.2 F (37.9 C)  TempSrc:   Oral Oral  SpO2:  97% 98% 90%  Weight:      Height:        Intake/Output Summary (Last 24 hours) at 06/01/2020 1316 Last data filed at 06/01/2020 0500 Gross per 24 hour  Intake 490 ml  Output 200 ml  Net 290 ml   Filed Weights   05/30/20 0500 05/31/20 0500 06/01/20 0254  Weight: 67.6 kg 63.5 kg 66.7 kg    Examination:  General exam: NAD Respiratory system: CTA Cardiovascular system: S 1, S 2 RRR Gastrointestinal  system: Bs present, soft, nt Central nervous system: Alert, follows command Extremities: symmetric power Skin: no rashes.    Data Reviewed: I have personally reviewed following labs and imaging studies  CBC: Recent Labs  Lab 05/28/20 2353 05/31/20 1102  WBC 7.7 11.8*  HGB 8.9* 9.7*  HCT 27.6* 30.2*  MCV 89.0 88.0  PLT 328 921   Basic Metabolic Panel: Recent Labs  Lab 05/28/20 2353  NA 139  K 3.7  CL 107  CO2 24  GLUCOSE 98  BUN 12  CREATININE 0.85  CALCIUM 8.7*  MG 1.7   GFR: Estimated Creatinine Clearance:  85 mL/min (by C-G formula based on SCr of 0.85 mg/dL). Liver Function Tests: Recent Labs  Lab 05/28/20 2353  AST 18  ALT 10  ALKPHOS 66  BILITOT 0.4  PROT 5.3*  ALBUMIN 2.4*   No results for input(s): LIPASE, AMYLASE in the last 168 hours. No results for input(s): AMMONIA in the last 168 hours. Coagulation Profile: No results for input(s): INR, PROTIME in the last 168 hours. Cardiac Enzymes: No results for input(s): CKTOTAL, CKMB, CKMBINDEX, TROPONINI in the last 168 hours. BNP (last 3 results) No results for input(s): PROBNP in the last 8760 hours. HbA1C: No results for input(s): HGBA1C in the last 72 hours. CBG: Recent Labs  Lab 05/26/20 1155  GLUCAP 104*   Lipid Profile: No results for input(s): CHOL, HDL, LDLCALC, TRIG, CHOLHDL, LDLDIRECT in the last 72 hours. Thyroid Function Tests: No results for input(s): TSH, T4TOTAL, FREET4, T3FREE, THYROIDAB in the last 72 hours. Anemia Panel: No results for input(s): VITAMINB12, FOLATE, FERRITIN, TIBC, IRON, RETICCTPCT in the last 72 hours. Sepsis Labs: No results for input(s): PROCALCITON, LATICACIDVEN in the last 168 hours.  Recent Results (from the past 240 hour(s))  SARS CORONAVIRUS 2 (TAT 6-24 HRS) Nasopharyngeal Nasopharyngeal Swab     Status: None   Collection Time: 05/30/20 11:25 AM   Specimen: Nasopharyngeal Swab  Result Value Ref Range Status   SARS Coronavirus 2 NEGATIVE NEGATIVE Final    Comment: (NOTE) SARS-CoV-2 target nucleic acids are NOT DETECTED.  The SARS-CoV-2 RNA is generally detectable in upper and lower respiratory specimens during the acute phase of infection. Negative results do not preclude SARS-CoV-2 infection, do not rule out co-infections with other pathogens, and should not be used as the sole basis for treatment or other patient management decisions. Negative results must be combined with clinical observations, patient history, and epidemiological information. The expected result is  Negative.  Fact Sheet for Patients: SugarRoll.be  Fact Sheet for Healthcare Providers: https://www.woods-mathews.com/  This test is not yet approved or cleared by the [place] FDA and  has been authorized for detection and/or diagnosis of SARS-CoV-2 by FDA under an Emergency Use Authorization (EUA). This EUA will remain  in effect (meaning this test can be used) for the duration of the COVID-19 declaration under Se ction 564(b)(1) of the Act, 21 U.S.C. section 360bbb-3(b)(1), unless the authorization is terminated or revoked sooner.  Performed at [facility], [place] [address] [place], [place] [address]   Culture, blood (single)     Status: None (Preliminary result)   Collection Time: 06/01/20  5:37 AM   Specimen: BLOOD LEFT HAND  Result Value Ref Range Status   Specimen Description BLOOD LEFT HAND  Final   Special Requests   Final    BOTTLES DRAWN AEROBIC AND ANAEROBIC Blood Culture adequate volume   Culture   Final    NO GROWTH < 12  HOURS Performed at [facility], [place] [address], [place], [place] [address]    Report Status PENDING  Incomplete         Radiology Studies: DG CHEST PORT 1 VIEW  Result Date: 06/01/2020 CLINICAL DATA:  History of previous pulmonary embolism with right ventricular strain, cough and fevers EXAM: PORTABLE CHEST 1 VIEW COMPARISON:  04/04/2020 FINDINGS: Cardiac shadow is stable. Tracheostomy tube and feeding catheter has been removed in the interval. Lungs are well aerated bilaterally. No focal infiltrate or sizable effusion is seen. Focal nodular density is again noted in the left upper lobe consistent with dilated pulmonary arterial branch. IMPRESSION: No acute abnormality noted. Electronically Signed   By: Inez Catalina M.D.   On: 06/01/2020 10:52        Scheduled Meds: . clonazePAM  0.5 mg Oral BID  . feeding supplement (ENSURE ENLIVE)  237 mL Oral QID  . FLUoxetine  10 mg Oral QHS    . OXcarbazepine  75 mg Oral BID  . pantoprazole  40 mg Oral BID  . polyethylene glycol  17 g Oral Daily  . QUEtiapine  100 mg Oral QHS  . QUEtiapine  75 mg Oral Daily   Continuous Infusions: . sodium chloride    . sodium chloride    . cefTRIAXone (ROCEPHIN)  IV 1 g (06/01/20 0852)  . sodium chloride       LOS: 86 days    Time spent: 35 minutes.     Elmarie Shiley, MD [facility]   If 7PM-7AM, please contact night-coverage [email or web address]  06/01/2020, 1:16 PM

## 2020-06-02 LAB — BASIC METABOLIC PANEL
Anion gap: 9 (ref 5–15)
BUN: 10 mg/dL (ref 8–23)
CO2: 24 mmol/L (ref 22–32)
Calcium: 8.4 mg/dL — ABNORMAL LOW (ref 8.9–10.3)
Chloride: 105 mmol/L (ref 98–111)
Creatinine, Ser: 0.97 mg/dL (ref 0.61–1.24)
GFR calc Af Amer: >60 mL/min (ref >60)
GFR calc non Af Amer: >60 mL/min (ref >60)
Glucose, Bld: 101 mg/dL — ABNORMAL HIGH (ref 70–99)
Potassium: 3.8 mmol/L (ref 3.5–5.1)
Sodium: 138 mmol/L (ref 135–145)

## 2020-06-02 LAB — BLOOD CULTURE ID PANEL (REFLEXED)

## 2020-06-02 LAB — CBC
HCT: 26 % — ABNORMAL LOW (ref 39.0–52.0)
Hemoglobin: 8.3 g/dL — ABNORMAL LOW (ref 13.0–17.0)
MCH: 28.2 pg (ref 26.0–34.0)
MCHC: 31.9 g/dL (ref 30.0–36.0)
MCV: 88.4 fL (ref 80.0–100.0)
Platelets: 289 10*3/uL (ref 150–400)
RBC: 2.94 MIL/uL — ABNORMAL LOW (ref 4.22–5.81)
RDW: 16.8 % — ABNORMAL HIGH (ref 11.5–15.5)
WBC: 18.2 10*3/uL — ABNORMAL HIGH (ref 4.0–10.5)
nRBC: 0 % (ref 0.0–0.2)

## 2020-06-02 LAB — URINE CULTURE
Culture: <10000 — AB
Special Requests: NORMAL

## 2020-06-02 MED ORDER — SODIUM CHLORIDE 0.9 % IV SOLN
2.0000 g | Freq: Every day | INTRAVENOUS | Status: DC
  Administered 2020-06-02: 2 g via INTRAVENOUS
  Filled 2020-06-02: qty 2

## 2020-06-02 MED ORDER — SODIUM CHLORIDE 0.9 % IV BOLUS
500.0000 mL | Freq: Once | INTRAVENOUS | Status: AC
  Administered 2020-06-02: 500 mL via INTRAVENOUS

## 2020-06-02 MED ORDER — SODIUM CHLORIDE 0.9 % IV SOLN
3.0000 g | Freq: Four times a day (QID) | INTRAVENOUS | Status: AC
  Administered 2020-06-02 – 2020-06-10 (×34): 3 g via INTRAVENOUS
  Filled 2020-06-02 (×2): qty 3
  Filled 2020-06-02: qty 0.1
  Filled 2020-06-02 (×7): qty 3
  Filled 2020-06-02: qty 8
  Filled 2020-06-02 (×4): qty 3
  Filled 2020-06-02: qty 8
  Filled 2020-06-02 (×4): qty 3
  Filled 2020-06-02: qty 0.1
  Filled 2020-06-02: qty 3
  Filled 2020-06-02: qty 0.1
  Filled 2020-06-02 (×6): qty 3
  Filled 2020-06-02: qty 8
  Filled 2020-06-02: qty 0.1
  Filled 2020-06-02 (×2): qty 3
  Filled 2020-06-02: qty 0.1
  Filled 2020-06-02: qty 3

## 2020-06-02 MED ORDER — SODIUM CHLORIDE 0.9 % IV BOLUS: 500.0000 mL | Freq: Once | INTRAVENOUS | Status: AC

## 2020-06-02 NOTE — Progress Notes (Signed)
PHARMACY - PHYSICIAN COMMUNICATION CRITICAL VALUE ALERT - BLOOD CULTURE IDENTIFICATION (BCID)  Kenneth Sullivan is an 64 y.o. male with new fevers and altered mental status.  Assessment:  Blood culture growing Streptococcus species  Name of physician (or Provider) Contacted:  Dr. Claria Dice  Current antibiotics: Rocephin  Changes to prescribed antibiotics recommended:  Increase Rocephin 2 g IV q24h  Results for orders placed or performed during the hospital encounter of 03/07/20  Blood Culture ID Panel (Reflexed) (Collected: 06/01/2020  5:37 AM)  Result Value Ref Range   Enterococcus species NOT DETECTED NOT DETECTED   Listeria monocytogenes NOT DETECTED NOT DETECTED   Staphylococcus species NOT DETECTED NOT DETECTED   Staphylococcus aureus (BCID) NOT DETECTED NOT DETECTED   Streptococcus species DETECTED (A) NOT DETECTED   Streptococcus agalactiae NOT DETECTED NOT DETECTED   Streptococcus pneumoniae NOT DETECTED NOT DETECTED   Streptococcus pyogenes NOT DETECTED NOT DETECTED   Acinetobacter baumannii NOT DETECTED NOT DETECTED   Enterobacteriaceae species NOT DETECTED NOT DETECTED   Enterobacter cloacae complex NOT DETECTED NOT DETECTED   Escherichia coli NOT DETECTED NOT DETECTED   Klebsiella oxytoca NOT DETECTED NOT DETECTED   Klebsiella pneumoniae NOT DETECTED NOT DETECTED   Proteus species NOT DETECTED NOT DETECTED   Serratia marcescens NOT DETECTED NOT DETECTED   Haemophilus influenzae NOT DETECTED NOT DETECTED   Neisseria meningitidis NOT DETECTED NOT DETECTED   Pseudomonas aeruginosa NOT DETECTED NOT DETECTED   Candida albicans NOT DETECTED NOT DETECTED   Candida glabrata NOT DETECTED NOT DETECTED   Candida krusei NOT DETECTED NOT DETECTED   Candida parapsilosis NOT DETECTED NOT DETECTED   Candida tropicalis NOT DETECTED NOT DETECTED    Caryl Pina 06/02/2020  6:17 AM

## 2020-06-02 NOTE — Progress Notes (Signed)
Patient behavior is erratic and delusional. Patient trying to leave his room in an agitated manor to confront a patient across the hall. Believes that the other patient is trying to harm the nurses and is trying to defend the male staff. Patient is refusing to allow male staff near him at this time, telling them to get out of his room. Patient reoriented by signing RN and returned to bed. Patient still refusing placement of an IV access at this time. Will continue to monitor.

## 2020-06-02 NOTE — Progress Notes (Signed)
Physical Therapy Treatment Patient Details Name: Kenneth Sullivan MRN: [id number] DOB: October 30, 1956 Today's Date: 06/02/2020    History of Present Illness Pt is a 64 y.o. male admitted 03/07/20 with acute SOB; had bradycardic event with asystole/cardiac arrest, ROSC achieved in 20 minutes with 3 rounds epinephrine; intubated 3/23. Pt with massive PE/DVT and RV strain. S/p IR guided mechanical thrombectomy and IVC filter placement. Devoloped gastric ulcer s/p GDA embolization. MRI 3/27 revealed multifocal acute to subacute ischemic infarct involving bilateral cerebral hemisphere consistent with global hypoperfusion; associated scattered petechial hemorrhages with evidence of hemmorhagic conversion in R parietal lobe. S/p trach 4/5, changed to cuffless 4/22; decannulated 4/28. Liver biopsy 5/19. 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 On 06/01/20 - pt with new UTI.  PMH unknown.    PT Comments    Pt was unable to ambulate as far and had further decreases in balance and activity tolerance today.  Per notes, pt with new UTI.  Pt continues to require min-mod A for balance and safety, as well as, multimodal cues for transfers and gait.  Cont POC as able.     Follow Up Recommendations  Supervision/Assistance - 24 hour;Other (comment);SNF (Needs 24 hr support, if SNF denies consider NHP or ALF)     Equipment Recommendations  Rolling walker with 5" wheels    Recommendations for Other Services       Precautions / Restrictions Precautions Precautions: Fall;Other (comment) Precaution Comments: impulsive    Mobility  Bed Mobility Overal bed mobility: Needs Assistance Bed Mobility: Supine to Sit;Sit to Supine     Supine to sit: Min assist Sit to supine: Min assist   General bed mobility comments: increased time and effort; max multimodal cues and assist to initiate  Transfers Overall transfer level: Needs assistance Equipment  used: 2 person hand held assist (refused RW) Transfers: Sit to/from Stand Sit to Stand: Mod assist         General transfer comment: Mod A from bed and toielt; assist with toielting ADLs  Ambulation/Gait Ambulation/Gait assistance: Min assist Gait Distance (Feet): 12 Feet (x2) Assistive device: 2 person hand held assist Gait Pattern/deviations: Step-through pattern;Decreased stride length;Shuffle;Wide base of support;Trunk flexed     General Gait Details: Pt with decreased balance and tolerance for activity today.  He required min A of 2 for steadying with gait and demonstrating shuffle with wide BOS - unable to correct with cues   Stairs             Wheelchair Mobility    Modified Rankin (Stroke Patients Only)       Balance Overall balance assessment: Needs assistance Sitting-balance support: No upper extremity supported Sitting balance-Leahy Scale: Good     Standing balance support: During functional activity;No upper extremity supported Standing balance-Leahy Scale: Poor Standing balance comment: required assistance from therapist for support in standing without UE support                            Cognition Arousal/Alertness: Awake/alert Behavior During Therapy: Impulsive Overall Cognitive Status: Impaired/Different from baseline Area of Impairment: Attention;Following commands;Safety/judgement;Awareness;Problem solving;Memory                 Orientation Level: Disoriented to;Time;Situation;Place Current Attention Level: Sustained Memory: Decreased short-term memory Following Commands: Follows one step commands with increased time Safety/Judgement: Decreased awareness of deficits;Decreased awareness of safety Awareness: Intellectual Problem Solving: Requires tactile cues;Requires verbal cues;Slow processing;Decreased  initiation;Difficulty sequencing General Comments: pt requires max multimodal cues for sequencing and navigating  environment;pt easily distractable by his own thoughts;pt with tangential thinking      Exercises      General Comments General comments (skin integrity, edema, etc.): Spoke with RN prior to therapy who reports pt more lethargic and not doing as well today, but ok to attempt therapy.      Pertinent Vitals/Pain Pain Assessment: Faces Faces Pain Scale: Hurts even more Pain Location: stomach Pain Descriptors / Indicators: Sore;Burning;Grimacing Pain Intervention(s): Limited activity within patient's tolerance;Monitored during session    Home Living                      Prior Function            PT Goals (current goals can now be found in the care plan section) Acute Rehab PT Goals Patient Stated Goal: to feel better PT Goal Formulation: With patient Time For Goal Achievement: 06/16/20 Potential to Achieve Goals: Fair Progress towards PT goals: Not progressing toward goals - comment (new UTI)    Frequency    Min 2X/week      PT Plan Current plan remains appropriate    Co-evaluation              AM-PAC PT "6 Clicks" Mobility   Outcome Measure  Help needed turning from your back to your side while in a flat bed without using bedrails?: A Little Help needed moving from lying on your back to sitting on the side of a flat bed without using bedrails?: A Little Help needed moving to and from a bed to a chair (including a wheelchair)?: A Little Help needed standing up from a chair using your arms (e.g., wheelchair or bedside chair)?: A Little Help needed to walk in hospital room?: A Lot Help needed climbing 3-5 steps with a railing? : A Lot 6 Click Score: 16    End of Session Equipment Utilized During Treatment: Gait belt Activity Tolerance: Patient limited by fatigue Patient left: with call bell/phone within reach;in bed;with bed alarm set Nurse Communication: Mobility status PT Visit Diagnosis: Unsteadiness on feet (R26.81);Other abnormalities of gait  and mobility (R26.89)     Time: 1448-1500 PT Time Calculation (min) (ACUTE ONLY): 12 min  Charges:  $Gait Training: 8-22 mins                     Abran Richard, PT Acute Rehab Services Pager [phone] [facility] [place] 06/02/2020, 3:32 PM

## 2020-06-02 NOTE — Progress Notes (Signed)
Ok to repeat blood cultures per Dr Nestor Ramp due to only one set was done yesterday.  Onnie Boer, PharmD, BCIDP, AAHIVP, CPP Infectious Disease Pharmacist 06/02/2020 10:30 AM

## 2020-06-02 NOTE — Progress Notes (Addendum)
PROGRESS NOTE    Kenneth Sullivan  RUE:[id number] DOB: 11/12/1956 DOA: 03/07/2020 PCP: Medicine, [name]   Brief Narrative: 64 year old with no prior medical history who had acute shortness of breath respiratory failure oxygen saturation in the 40s on EMS arrival.  He had a witnessed bradycardic, asystole arrest when EMS was present with 3 rounds of epi, 20 minutes of CPR prior to [place]. Underwent normothermia protocol.  Work-up was significant for massive PE, DVT with right ventricular strain.  Unfortunately hospital course was complicated by upper GI bleed due to gastric ulcer.  A CT abdomen pelvis showed a large antral mass and multiple liver lesions biopsy confirmed a high-grade neuroendocrine tumor, subsequent EGD with biopsy revealing adenocarcinoma.  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   For his massive PE patient underwent IR guided mechanical thrombectomy and IVC filter.  Post procedure, while on heparin drip patient is started having maroon-colored red stool and bloody drainage from OGT.  GI did endoscopy which showed oozing gastric ulcer with clots.  IR did GDA embolization.  Patient remained encephalopathic. On 3/27, MRI of the brain showed multifocal acute to subacute ischemic infarct involving bilateral cerebral hemisphere consistent with global hypoperfusion related to cardiac arrest, also associated with scatter petechial hemorrhage with evidence of hemorrhagic conversion in the parietal lobe. Abdomen and pelvis showed gastric lesion in the antrum concerning for antral mass and multiple liver lesion concerning for metastatic disease IR consulted and he underwent liver biopsy on 5/19.  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   Assessment & Plan:   Principal Problem:   Cardiac arrest [name]) Active Problems:   Encounter for central line placement   GI bleed   Acute respiratory  failure (HCC)   Anoxic encephalopathy (HCC)   Cerebral embolism with cerebral infarction   Pulmonary emboli ([place])   Palliative care by specialist   Goals of care, counseling/discussion   DNR (do not resuscitate)   Status post tracheostomy ([place])   Protein-calorie malnutrition, severe   Neuroendocrine cancer ([place])   1-Acute hypoxic respiratory failure in the setting of massive PE/cardiac arrest: Managed in the ICU with status post tracheostomy subsequently decannulated.  Currently stable on room air.  2-Cardiac arrest: Secondary to massive PE On telemetry. Patient is not a long-term anticoagulation candidate given large gastric ulcer, secondary to neuroendocrine tumor given risk for massive GI bleed. Also history of a stroke with hemorrhagic conversion.  3-Massive PE with DVT status post IVC filter: Patient is not a long-term anticoagulation candidate given large gastric ulcer, secondary to neuroendocrine tumor given risk for massive GI bleed. Status post IVC filter placement on mechanical thrombectomy.  4-anoxic encephalopathy: Pleasantly confused, answer few questions.  5-Upper GI bleed secondary to large malignant gastric ulcer with acute blood loss anemia Underwent endoscopy x2.  Subsequently embolization of GDA by IR Continue with PPI. Endoscopy showed adenocarcinoma. Patient report bloody stool last night.  Repeated hemoglobin is stable  6-Gastric adenocarcinoma with neuroendocrine component that has metastasized to the liver; -Patient was accepted at assisted living facility here in [place].  The facility is not able to provide transportation to the cancer center for chemotherapy.  Discussed with Dr. Learta Codding consult Center might be able to provide with transportation. -We will follow up with Dr. Benay Spice tomorrow to see if cancer center will be able to provide transportation. - Fever, Bacteremia;  Patient develops fever, mild leukocytosis.  Blood culture growing  streptococcus.  UA with  more than 50 WBC. Insignificant growth.  Started ceftriaxone.  Source could be GI from cancer. Discussed with ID will change IV antibiotics to Unasyn, if continue to have fevers could get CT abdomen pelvis.   Tachycardia; related to fever. Hypovolemia. Hypotension Received fluids.  Vitals stable today.  On IV ceftriaxone.  Follow WBC<   Dysphagia: Currently on regular diet  Delusion hallucination: Psych consulted.  At times he gets agitated.  He is  Improved. Continue with Seroquel Severe malnutrition: Etiology chronic illness gastric lesion, multiple evaluation.  Continue with Ensure   Nutrition Problem: Severe Malnutrition Etiology: chronic illness (gastric lesion, multiple liver lesions, concern for metastatic disease)    Signs/Symptoms: severe fat depletion, severe muscle depletion    Interventions: Ensure Enlive (each supplement provides 350kcal and 20 grams of protein), Magic cup  Estimated body mass index is 18.87 kg/m as calculated from the following:   Height as of this encounter: 6\' 2"  (1.88 m).   Weight as of this encounter: 66.7 kg.   DVT prophylaxis: SCDs Code Status: DNR Family Communication: Discussed with patient Disposition Plan:  Status is: Inpatient  Remains inpatient appropriate because:Awaiting placement   Dispo: The patient is from: Home              Anticipated d/c is to: ALF              Anticipated d/c date is: 1 day              Patient currently is not medically stable to d/c. Patient with fever, tachycardia, treating for UTI.   Discussed with Dr. Learta Codding, he will see if cancer center can help with transportation for chemotherapy        Consultants:  CC[facility] Oncology Palliative  Procedures:  Significant events: 3/23>> Admit, EGD with findings of oozing gastric ulcer with clot, IR for mechanical thrombectomy, IVC filter and GDA embolization 4/5>>Trach 4/28>>trach decannulated  Significant studies: CTA  3/23 >> bilateral PE with RV/with ratio 1.9, emphysema CT head 3/23 >>chronic microvascular changes. No acute findings Echo 3/23 >> RV is severely dilated with reduced function and D-shaped septum, LVEF 60-65%. Venogram 3/23>> occlusive DVT of the iliac vein extending to the lower IVC LE Venous Duplex 3/24 >>acute DVT in BLE up to the femoral vein  MRI Brain 3/28 >> multifocal acute to subacute ischemic infarcts involving bilateral cerebral hemispheres, findings consistent with global hypoperfusion event related to cardiac arrest, associated scattered petechial hemorrhage about many areas of ischemia, evidence of hemorrhagic conversion in the right parietal lobe   Antimicrobials:  Doxycycline from 4/9 on 12/17  Subjective: He is confuse, paranoid.  No new complaints.   Objective: Vitals:   06/01/20 1646 06/01/20 2132 06/02/20 0731 06/02/20 1009  BP: 94/72 110/76 93/69 104/76  Pulse: (!) 114 100 (!) 105 100  Resp: 17 18 18 19   Temp: 98.3 F (36.8 C) 99 F (37.2 C) 99.5 F (37.5 C) 98.3 F (36.8 C)  TempSrc:  Oral Oral Oral  SpO2: 96% 98% (!) 89% 95%  Weight:      Height:        Intake/Output Summary (Last 24 hours) at 06/02/2020 1412 Last data filed at 06/02/2020 0400 Gross per 24 hour  Intake 0 ml  Output --  Net 0 ml   Filed Weights   05/30/20 0500 05/31/20 0500 06/01/20 0254  Weight: 67.6 kg 63.5 kg 66.7 kg    Examination:  General exam: NAD Respiratory system: CTA Cardiovascular system: S 1, S  2 RRR Gastrointestinal system: BBS present, soft, nt Central nervous system: alert Extremities: Symmetric power.  Skin: no rashes.    Data Reviewed: I have personally reviewed following labs and imaging studies  CBC: Recent Labs  Lab 05/28/20 2353 05/31/20 1102 06/02/20 0337  WBC 7.7 11.8* 18.2*  HGB 8.9* 9.7* 8.3*  HCT 27.6* 30.2* 26.0*  MCV 89.0 88.0 88.4  PLT 328 342 681   Basic Metabolic Panel: Recent Labs  Lab 05/28/20 2353 06/02/20 0337  NA  139 138  K 3.7 3.8  CL 107 105  CO2 24 24  GLUCOSE 98 101*  BUN 12 10  CREATININE 0.85 0.97  CALCIUM 8.7* 8.4*  MG 1.7  --    GFR: Estimated Creatinine Clearance: 74.5 mL/min (by C-G formula based on SCr of 0.97 mg/dL). Liver Function Tests: Recent Labs  Lab 05/28/20 2353  AST 18  ALT 10  ALKPHOS 66  BILITOT 0.4  PROT 5.3*  ALBUMIN 2.4*   No results for input(s): LIPASE, AMYLASE in the last 168 hours. No results for input(s): AMMONIA in the last 168 hours. Coagulation Profile: No results for input(s): INR, PROTIME in the last 168 hours. Cardiac Enzymes: No results for input(s): CKTOTAL, CKMB, CKMBINDEX, TROPONINI in the last 168 hours. BNP (last 3 results) No results for input(s): PROBNP in the last 8760 hours. HbA1C: No results for input(s): HGBA1C in the last 72 hours. CBG: No results for input(s): GLUCAP in the last 168 hours. Lipid Profile: No results for input(s): CHOL, HDL, LDLCALC, TRIG, CHOLHDL, LDLDIRECT in the last 72 hours. Thyroid Function Tests: No results for input(s): TSH, T4TOTAL, FREET4, T3FREE, THYROIDAB in the last 72 hours. Anemia Panel: No results for input(s): VITAMINB12, FOLATE, FERRITIN, TIBC, IRON, RETICCTPCT in the last 72 hours. Sepsis Labs: Recent Labs  Lab 06/01/20 1744  LATICACIDVEN 1.6    Recent Results (from the past 240 hour(s))  SARS CORONAVIRUS 2 (TAT 6-24 HRS) Nasopharyngeal Nasopharyngeal Swab     Status: None   Collection Time: 05/30/20 11:25 AM   Specimen: Nasopharyngeal Swab  Result Value Ref Range Status   SARS Coronavirus 2 NEGATIVE NEGATIVE Final    Comment: (NOTE) SARS-CoV-2 target nucleic acids are NOT DETECTED.  The SARS-CoV-2 RNA is generally detectable in upper and lower respiratory specimens during the acute phase of infection. Negative results do not preclude SARS-CoV-2 infection, do not rule out co-infections with other pathogens, and should not be used as the sole basis for treatment or other patient  management decisions. Negative results must be combined with clinical observations, patient history, and epidemiological information. The expected result is Negative.  Fact Sheet for Patients: SugarRoll.be  Fact Sheet for Healthcare Providers: https://www.woods-mathews.com/  This test is not yet approved or cleared by the [place] FDA and  has been authorized for detection and/or diagnosis of SARS-CoV-2 by FDA under an Emergency Use Authorization (EUA). This EUA will remain  in effect (meaning this test can be used) for the duration of the COVID-19 declaration under Se ction 564(b)(1) of the Act, 21 U.S.C. section 360bbb-3(b)(1), unless the authorization is terminated or revoked sooner.  Performed at [facility], [place] [address], [place], [place] [address]   Culture, blood (single)     Status: None (Preliminary result)   Collection Time: 06/01/20  5:37 AM   Specimen: BLOOD LEFT HAND  Result Value Ref Range Status   Specimen Description BLOOD LEFT HAND  Final   Special Requests   Final    BOTTLES  DRAWN AEROBIC AND ANAEROBIC Blood Culture adequate volume   Culture  Setup Time   Final    IN BOTH AEROBIC AND ANAEROBIC BOTTLES GRAM POSITIVE COCCI IN CHAINS Organism ID to follow CRITICAL RESULT CALLED TO, READ BACK BY AND VERIFIED WITH: G ABBOTT PHARMD 06/02/20 0616 JDW    Culture   Final    NO GROWTH 1 DAY Performed at [facility], [place] [address] [place], [place] [address]    Report Status PENDING  Incomplete  Blood Culture ID Panel (Reflexed)     Status: Abnormal   Collection Time: 06/01/20  5:37 AM  Result Value Ref Range Status   Enterococcus species NOT DETECTED NOT DETECTED Final   Listeria monocytogenes NOT DETECTED NOT DETECTED Final   Staphylococcus species NOT DETECTED NOT DETECTED Final   Staphylococcus aureus (BCID) NOT DETECTED NOT DETECTED Final   Streptococcus species DETECTED (A) NOT DETECTED Final     Comment: Not Enterococcus species, Streptococcus agalactiae, Streptococcus pyogenes, or Streptococcus pneumoniae. CRITICAL RESULT CALLED TO, READ BACK BY AND VERIFIED WITH: G ABBOTT PHARMD 06/02/20 0616 JDW    Streptococcus agalactiae NOT DETECTED NOT DETECTED Final   Streptococcus pneumoniae NOT DETECTED NOT DETECTED Final   Streptococcus pyogenes NOT DETECTED NOT DETECTED Final   Acinetobacter baumannii NOT DETECTED NOT DETECTED Final   Enterobacteriaceae species NOT DETECTED NOT DETECTED Final   Enterobacter cloacae complex NOT DETECTED NOT DETECTED Final   Escherichia coli NOT DETECTED NOT DETECTED Final   Klebsiella oxytoca NOT DETECTED NOT DETECTED Final   Klebsiella pneumoniae NOT DETECTED NOT DETECTED Final   Proteus species NOT DETECTED NOT DETECTED Final   Serratia marcescens NOT DETECTED NOT DETECTED Final   Haemophilus influenzae NOT DETECTED NOT DETECTED Final   Neisseria meningitidis NOT DETECTED NOT DETECTED Final   Pseudomonas aeruginosa NOT DETECTED NOT DETECTED Final   Candida albicans NOT DETECTED NOT DETECTED Final   Candida glabrata NOT DETECTED NOT DETECTED Final   Candida krusei NOT DETECTED NOT DETECTED Final   Candida parapsilosis NOT DETECTED NOT DETECTED Final   Candida tropicalis NOT DETECTED NOT DETECTED Final    Comment: Performed at [facility], [place] [address] [place], [place] [address]  Urine Culture     Status: Abnormal   Collection Time: 06/01/20  8:57 AM   Specimen: Urine, Random  Result Value Ref Range Status   Specimen Description URINE, RANDOM  Final   Special Requests Normal  Final   Culture (A)  Final    <10,000 COLONIES/mL INSIGNIFICANT GROWTH Performed at [facility], [place] [address] [place], [place] [address]    Report Status 06/02/2020 FINAL  Final         Radiology Studies: DG CHEST PORT 1 VIEW  Result Date: 06/01/2020 CLINICAL DATA:  History of previous pulmonary embolism with right ventricular strain, cough and  fevers EXAM: PORTABLE CHEST 1 VIEW COMPARISON:  04/04/2020 FINDINGS: Cardiac shadow is stable. Tracheostomy tube and feeding catheter has been removed in the interval. Lungs are well aerated bilaterally. No focal infiltrate or sizable effusion is seen. Focal nodular density is again noted in the left upper lobe consistent with dilated pulmonary arterial branch. IMPRESSION: No acute abnormality noted. Electronically Signed   By: Inez Catalina M.D.   On: 06/01/2020 10:52        Scheduled Meds: . clonazePAM  0.5 mg Oral BID  . feeding supplement (ENSURE ENLIVE)  237 mL Oral QID  . FLUoxetine  10 mg Oral QHS  .  OXcarbazepine  75 mg Oral BID  . pantoprazole  40 mg Oral BID  . polyethylene glycol  17 g Oral Daily  . QUEtiapine  100 mg Oral QHS  . QUEtiapine  75 mg Oral Daily   Continuous Infusions: . sodium chloride    . sodium chloride Stopped (06/01/20 2200)  . cefTRIAXone (ROCEPHIN)  IV 2 g (06/02/20 1013)     LOS: 87 days    Time spent: 35 minutes.     Elmarie Shiley, MD [facility]   If 7PM-7AM, please contact night-coverage [email or web address]  06/02/2020, 2:12 PM

## 2020-06-02 NOTE — Progress Notes (Signed)
Nutrition Follow-up  DOCUMENTATION CODES:   Underweight, Severe malnutrition in context of chronic illness  INTERVENTION:   - Feeding assistance with meals and snacks  -Ensure Enlive poQID, each supplement provides 350 kcal and 20 grams of protein  -MagicCup TID with meals, each supplement provides 290 kcal and 9 grams of protein  - Encourage adequate PO intake  NUTRITION DIAGNOSIS:   Severe Malnutrition related to chronic illness (gastric lesion, multiple liver lesions, concern for metastatic disease) as evidenced by severe fat depletion, severe muscle depletion.  Ongoing  GOAL:   Patient will meet greater than or equal to 90% of their needs  Progressing   MONITOR:   PO intake, Supplement acceptance, Labs, Weight trends  REASON FOR ASSESSMENT:   Ventilator    ASSESSMENT:   64 yo male admitted S/P cardiac arrest, S/P normothermia protocol. Found to have massive PE, DVT, GI bleed from gastric ulcer. PMH includes current smoker, heavy alcohol use.  4/02 - trach  4/09 - Cortrak placed  4/11- pt pulled out trach, replaced by RT,TF held due to Cortrak leaking  4/13 - MBS, Dysphagia 1 with thin liquids started 4/14 - Cortrak removed 4/16 - Cortrak replaced 4/22 - Cortrak clogged, replaced by diagnostic radiology (tip in pre-pyloric region of stomach) 4/25 - diet advanced to Dysphagia 2 4/27 - tube feeds changed to nocturnal 4/28 - decannulated 4/30 - pulled Cortrak, TF stopped 5/03 - diet upgraded to Dysphagia 3 5/05 - diet upgraded to Regular 5/19 - s/p liver lesion biopsy by IR 5/28- EGD reveals gastric adenocarcinoma  Reviewed I/O's: -250 ml x 24 hours and +3 L since 05/19/20  UOP: 250 ml x 24 hours  Pt sleeping soundly at time of visit. RD did not disturb.   Pt remains with good appetite; meal completion 25-100%. He is consuming Ensure supplements per MAR.   Per TOC notes, pt continues to await SNF placement; pt requires facility that will  transport him to [facility] for cancer treatments.   Medications reviewed and include miralax and rocephin.   Labs reviewed.  Diet Order:   Diet Order            Diet regular Room service appropriate? No; Fluid consistency: Thin  Diet effective now                 EDUCATION NEEDS:   Not appropriate for education at this time  Skin:  Skin Assessment: Reviewed RN Assessment Skin Integrity Issues:: Other (Comment) Other: puncture neck, groin  Last BM:  05/28/20  Height:   Ht Readings from Last 1 Encounters:  04/29/20 6\' 2"  (1.88 m)    Weight:   Wt Readings from Last 1 Encounters:  06/01/20 66.7 kg    Ideal Body Weight:  86.4 kg  BMI:  Body mass index is 18.87 kg/m.  Estimated Nutritional Needs:   Kcal:  2100-2300  Protein:  110-125 gm  Fluid:  >/= 2 L    Loistine Chance, RD, LDN, [place] Registered Dietitian II Certified Diabetes Care and Education Specialist Please refer to [facility] for RD and/or RD on-call/weekend/after hours pager

## 2020-06-02 NOTE — Progress Notes (Signed)
IV team came to place PIV, pt refused PIV. Became agitated when staff tried to explain why he needed the PIV.

## 2020-06-02 NOTE — TOC Progression Note (Signed)
Transition of Care [facility]) - Progression Note    Patient Details  Name: Kenneth Sullivan MRN: [id number] Date of Birth: 1956-10-17  Transition of Care [facility]) CM/SW [place], RN Phone Number: [phone] 06/02/2020, 3:23 PM  Clinical Narrative:    CM at bedside to complete substance abuse counseling. Patient states that he is not in the mood to answer any questions because he is mad. States he is mad because "you can take one day out to come see me." Resource list left with patient. Patient states that he doesn't need those papers. CM will continue to monitor and assess when patient is more approachable.   Expected Discharge Plan: Skilled Nursing Facility Barriers to Discharge: No SNF bed  Expected Discharge Plan and Services Expected Discharge Plan: [place] In-house Referral: Clinical Social Work     Living arrangements for the past 2 months: Apartment                                       Social Determinants of Health (SDOH) Interventions    Readmission Risk Interventions No flowsheet data found.

## 2020-06-02 NOTE — Progress Notes (Signed)
At bedside to answer call, pt. For PIV insertion but REFUSED.RN also at bedside and will notify MD of pt.'s refusal to IV start for his IV abt. Pt is confused, uncooperative.

## 2020-06-03 ENCOUNTER — Inpatient Hospital Stay (HOSPITAL_COMMUNITY): Payer: Medicaid Other

## 2020-06-03 LAB — BASIC METABOLIC PANEL
Anion gap: 9 (ref 5–15)
BUN: 11 mg/dL (ref 8–23)
CO2: 26 mmol/L (ref 22–32)
Calcium: 8.7 mg/dL — ABNORMAL LOW (ref 8.9–10.3)
Chloride: 104 mmol/L (ref 98–111)
Creatinine, Ser: 0.87 mg/dL (ref 0.61–1.24)
GFR calc Af Amer: >60 mL/min (ref >60)
GFR calc non Af Amer: >60 mL/min (ref >60)
Glucose, Bld: 98 mg/dL (ref 70–99)
Potassium: 3.8 mmol/L (ref 3.5–5.1)
Sodium: 139 mmol/L (ref 135–145)

## 2020-06-03 LAB — CBC
HCT: 30.1 % — ABNORMAL LOW (ref 39.0–52.0)
Hemoglobin: 9.5 g/dL — ABNORMAL LOW (ref 13.0–17.0)
MCH: 27.6 pg (ref 26.0–34.0)
MCHC: 31.6 g/dL (ref 30.0–36.0)
MCV: 87.5 fL (ref 80.0–100.0)
Platelets: 289 10*3/uL (ref 150–400)
RBC: 3.44 MIL/uL — ABNORMAL LOW (ref 4.22–5.81)
RDW: 17.1 % — ABNORMAL HIGH (ref 11.5–15.5)
WBC: 17.5 10*3/uL — ABNORMAL HIGH (ref 4.0–10.5)
nRBC: 0 % (ref 0.0–0.2)

## 2020-06-03 MED ORDER — IOHEXOL 9 MG/ML PO SOLN
ORAL | Status: AC
  Filled 2020-06-03: qty 500

## 2020-06-03 MED ORDER — IOHEXOL 300 MG/ML  SOLN
100.0000 mL | Freq: Once | INTRAMUSCULAR | Status: AC | PRN
  Administered 2020-06-03: 100 mL via INTRAVENOUS

## 2020-06-03 MED ORDER — IOHEXOL 9 MG/ML PO SOLN
500.0000 mL | ORAL | Status: AC
  Administered 2020-06-03: 500 mL via ORAL

## 2020-06-03 NOTE — Progress Notes (Signed)
PROGRESS NOTE    Kenneth Sullivan  POE:[id number] DOB: [birth year] DOA: 03/07/2020 PCP: Medicine, [name]   Brief Narrative: 64 year old with no prior medical history who had acute shortness of breath respiratory failure oxygen saturation in the 40s on EMS arrival.  He had a witnessed bradycardic, asystole arrest when EMS was present with 3 rounds of epi, 20 minutes of CPR prior to [place]. Underwent normothermia protocol.  Work-up was significant for massive PE, DVT with right ventricular strain.  Unfortunately hospital course was complicated by upper GI bleed due to gastric ulcer.  A CT abdomen pelvis showed a large antral mass and multiple liver lesions biopsy confirmed a high-grade neuroendocrine tumor, subsequent EGD with biopsy revealing adenocarcinoma.  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   For his massive PE patient underwent IR guided mechanical thrombectomy and IVC filter.  Post procedure, while on heparin drip patient is started having maroon-colored red stool and bloody drainage from OGT.  GI did endoscopy which showed oozing gastric ulcer with clots.  IR did GDA embolization.  Patient remained encephalopathic. On 3/27, MRI of the brain showed multifocal acute to subacute ischemic infarct involving bilateral cerebral hemisphere consistent with global hypoperfusion related to cardiac arrest, also associated with scatter petechial hemorrhage with evidence of hemorrhagic conversion in the parietal lobe. Abdomen and pelvis showed gastric lesion in the antrum concerning for antral mass and multiple liver lesion concerning for metastatic disease IR consulted and he underwent liver biopsy on 5/19.  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   Chest x ray repeated negative for PNA> requiring 2 L oxygen,.  2-Cardiac arrest: Secondary to massive PE On telemetry. Patient is not a long-term anticoagulation candidate given large gastric ulcer, secondary to neuroendocrine tumor given risk for massive GI bleed. Also history of a stroke with hemorrhagic conversion.  3-Massive PE with DVT status post IVC filter: Patient is not a long-term anticoagulation candidate given large gastric ulcer, secondary to neuroendocrine tumor given risk for massive GI bleed. Status post IVC filter placement on mechanical thrombectomy.  4-anoxic encephalopathy: Pleasantly confused, answer few questions.  5-Upper GI bleed secondary to large malignant gastric ulcer with acute blood loss anemia Underwent endoscopy x2.  Subsequently embolization of GDA by IR Continue with PPI. Endoscopy showed adenocarcinoma. Patient report bloody stool last night.  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  The facility is not able to provide transportation to the cancer center for chemotherapy.  Discussed with Dr. Learta Codding consult Center might be able to provide with transportation. -We will follow up with Dr. Benay Spice tomorrow to see if cancer center will be able to provide transportation. - 7-Fever, Bacteremia; Streptococcus.  Patient develops fever,  mild leukocytosis.  Blood culture growing streptococcus.  UA with more than 50 WBC. Insignificant growth.  Source could be GI from cancer. Discussed with ID will change IV antibiotics to Unasyn, if continue to have fevers could get CT abdomen pelvis.  No fevers since yesterday, continue with unasyn.   Tachycardia; related to fever. Hypovolemia. Hypotension Received fluids.  Vitals stable today.  On IV ceftriaxone.  Follow WBC<   Dysphagia: Currently on regular diet  Delusion hallucination: Psych consulted.  At times he gets agitated.  He is  Improved. Continue with Seroquel Severe malnutrition: Etiology chronic illness gastric lesion, multiple evaluation.  Continue with Ensure   Nutrition Problem: Severe Malnutrition Etiology: chronic illness (gastric lesion, multiple liver lesions, concern for metastatic disease)    Signs/Symptoms: severe fat depletion, severe muscle depletion    Interventions: Ensure Enlive (each supplement provides 350kcal and 20 grams of protein), Magic cup  Estimated body mass index is 18.87 kg/m as calculated from the following:   Height as of this encounter: 6\' 2"  (1.88 m).   Weight as of this encounter: 66.7 kg.   DVT prophylaxis: SCDs Code Status: DNR Family Communication: Discussed with patient Disposition Plan:  Status is: Inpatient  Remains inpatient appropriate because:Awaiting placement   Dispo: The patient is from: Home              Anticipated d/c is to: ALF              Anticipated d/c date is: 1 day              Patient currently is not medically stable to d/c. Patient with fever, tachycardia, treating for UTI.   Discussed with Dr. Learta Codding, he will see if cancer center can help with transportation for chemotherapy        Consultants:  [facility] Oncology Palliative  Procedures:  Significant events: 3/23>> Admit, EGD with findings of oozing gastric ulcer with clot, IR for mechanical thrombectomy, IVC filter and GDA  embolization 4/5>>Trach 4/28>>trach decannulated  Significant studies: CTA 3/23 >> bilateral PE with RV/with ratio 1.9, emphysema CT head 3/23 >>chronic microvascular changes. No acute findings Echo 3/23 >> RV is severely dilated with reduced function and D-shaped septum, LVEF 60-65%. Venogram 3/23>> occlusive DVT of the iliac vein extending to the lower IVC LE Venous Duplex 3/24 >>acute DVT in BLE up to the femoral vein  MRI Brain 3/28 >> multifocal acute to subacute ischemic infarcts involving bilateral cerebral hemispheres, findings consistent with global hypoperfusion event related to cardiac arrest, associated scattered petechial hemorrhage about many areas of ischemia, evidence of hemorrhagic conversion in the right parietal lobe   Antimicrobials:  Doxycycline from 4/9 on 12/17  Subjective: Appears less confuse. Asking for breakfast   Objective: Vitals:   06/02/20 1931 06/02/20 2255 06/02/20 2300 06/03/20 0748  BP: 101/70  102/66 128/79  Pulse: 100  (!) 105 (!) 125  Resp: 20  19 20   Temp: (!) 101.4 F (38.6 C) 98.2 F (36.8 C) 98 F (36.7 C) 98.8 F (37.1 C)  TempSrc: Oral Oral Oral   SpO2: 98%  97% (!) 89%  Weight:      Height:        Intake/Output Summary (Last 24 hours) at 06/03/2020 0906 Last data filed at 06/02/2020 1800 Gross per 24 hour  Intake 700 ml  Output --  Net 700 ml   Filed Weights   05/30/20 0500 05/31/20 0500 06/01/20 0254  Weight: 67.6 kg 63.5 kg 66.7 kg    Examination:  General exam: NAD Respiratory system: CTA Cardiovascular system: S 1, S 2 RRR Gastrointestinal system: BS present, soft, nt Central nervous system:Alert Extremities: Symmetric power  Skin: no rashes.    Data Reviewed: I have personally reviewed following labs and imaging studies  CBC: Recent Labs  Lab 05/28/20 2353 05/31/20 1102 06/02/20 0337  WBC 7.7 11.8* 18.2*  HGB 8.9* 9.7* 8.3*  HCT 27.6* 30.2* 26.0*  MCV 89.0 88.0 88.4  PLT 328 342 834   Basic  Metabolic Panel: Recent Labs  Lab 05/28/20 2353 06/02/20 0337  NA 139 138  K 3.7 3.8  CL 107 105  CO2 24 24  GLUCOSE 98 101*  BUN 12 10  CREATININE 0.85 0.97  CALCIUM 8.7* 8.4*  MG 1.7  --    GFR: Estimated Creatinine Clearance: 74.5 mL/min (by C-G formula based on SCr of 0.97 mg/dL). Liver Function Tests: Recent Labs  Lab 05/28/20 2353  AST 18  ALT 10  ALKPHOS 66  BILITOT 0.4  PROT 5.3*  ALBUMIN 2.4*   No results for input(s): LIPASE, AMYLASE in the last 168 hours. No results for input(s): AMMONIA in the last 168 hours. Coagulation Profile: No results for input(s): INR, PROTIME in the last 168 hours. Cardiac Enzymes: No results for input(s): CKTOTAL, CKMB, CKMBINDEX, TROPONINI in the last 168 hours. BNP (last 3 results) No results for input(s): PROBNP in the last 8760 hours. HbA1C: No results for input(s): HGBA1C in the last 72 hours. CBG: No results for input(s): GLUCAP in the last 168 hours. Lipid Profile: No results for input(s): CHOL, HDL, LDLCALC, TRIG, CHOLHDL, LDLDIRECT in the last 72 hours. Thyroid Function Tests: No results for input(s): TSH, T4TOTAL, FREET4, T3FREE, THYROIDAB in the last 72 hours. Anemia Panel: No results for input(s): VITAMINB12, FOLATE, FERRITIN, TIBC, IRON, RETICCTPCT in the last 72 hours. Sepsis Labs: Recent Labs  Lab 06/01/20 1744  LATICACIDVEN 1.6    Recent Results (from the past 240 hour(s))  SARS CORONAVIRUS 2 (TAT 6-24 HRS) Nasopharyngeal Nasopharyngeal Swab     Status: None   Collection Time: 05/30/20 11:25 AM   Specimen: Nasopharyngeal Swab  Result Value Ref Range Status   SARS Coronavirus 2 NEGATIVE NEGATIVE Final    Comment: (NOTE) SARS-CoV-2 target nucleic acids are NOT DETECTED.  The SARS-CoV-2 RNA is generally detectable in upper and lower respiratory specimens during the acute phase of infection. Negative results do not preclude SARS-CoV-2 infection, do not rule out co-infections with other pathogens, and  should not be used as the sole basis for treatment or other patient management decisions. Negative results must be combined with clinical observations, patient history, and epidemiological information. The expected result is Negative.  Fact Sheet for Patients: SugarRoll.be  Fact Sheet for Healthcare Providers: https://www.woods-mathews.com/  This test is not yet approved or cleared by the [place] FDA and  has been authorized for detection and/or diagnosis of SARS-CoV-2 by FDA under an Emergency Use Authorization (EUA). This EUA will remain  in effect (meaning this test can be used) for the duration of the COVID-19 declaration under Se ction 564(b)(1) of the Act, 21 U.S.C. section 360bbb-3(b)(1), unless the authorization is terminated or revoked sooner.  Performed at [facility], [place] [address], [place], [place] [address]   Culture, blood (single)     Status: None (Preliminary result)   Collection Time: 06/01/20  5:37 AM   Specimen: BLOOD LEFT HAND  Result Value Ref Range Status   Specimen Description BLOOD LEFT HAND  Final  Special Requests   Final    BOTTLES DRAWN AEROBIC AND ANAEROBIC Blood Culture adequate volume   Culture  Setup Time   Final    IN BOTH AEROBIC AND ANAEROBIC BOTTLES GRAM POSITIVE COCCI IN CHAINS Organism ID to follow CRITICAL RESULT CALLED TO, READ BACK BY AND VERIFIED WITH: G ABBOTT PHARMD 06/02/20 0616 JDW    Culture   Final    NO GROWTH 1 DAY Performed at [facility], [place] [address], [place], [place] [address]    Report Status PENDING  Incomplete  Blood Culture ID Panel (Reflexed)     Status: Abnormal   Collection Time: 06/01/20  5:37 AM  Result Value Ref Range Status   Enterococcus species NOT DETECTED NOT DETECTED Final   Listeria monocytogenes NOT DETECTED NOT DETECTED Final   Staphylococcus species NOT DETECTED NOT DETECTED Final   Staphylococcus aureus (BCID) NOT DETECTED NOT DETECTED  Final   Streptococcus species DETECTED (A) NOT DETECTED Final    Comment: Not Enterococcus species, Streptococcus agalactiae, Streptococcus pyogenes, or Streptococcus pneumoniae. CRITICAL RESULT CALLED TO, READ BACK BY AND VERIFIED WITH: G ABBOTT PHARMD 06/02/20 0616 JDW    Streptococcus agalactiae NOT DETECTED NOT DETECTED Final   Streptococcus pneumoniae NOT DETECTED NOT DETECTED Final   Streptococcus pyogenes NOT DETECTED NOT DETECTED Final   Acinetobacter baumannii NOT DETECTED NOT DETECTED Final   Enterobacteriaceae species NOT DETECTED NOT DETECTED Final   Enterobacter cloacae complex NOT DETECTED NOT DETECTED Final   Escherichia coli NOT DETECTED NOT DETECTED Final   Klebsiella oxytoca NOT DETECTED NOT DETECTED Final   Klebsiella pneumoniae NOT DETECTED NOT DETECTED Final   Proteus species NOT DETECTED NOT DETECTED Final   Serratia marcescens NOT DETECTED NOT DETECTED Final   Haemophilus influenzae NOT DETECTED NOT DETECTED Final   Neisseria meningitidis NOT DETECTED NOT DETECTED Final   Pseudomonas aeruginosa NOT DETECTED NOT DETECTED Final   Candida albicans NOT DETECTED NOT DETECTED Final   Candida glabrata NOT DETECTED NOT DETECTED Final   Candida krusei NOT DETECTED NOT DETECTED Final   Candida parapsilosis NOT DETECTED NOT DETECTED Final   Candida tropicalis NOT DETECTED NOT DETECTED Final    Comment: Performed at [facility], [place] [address], [place], [place] [address]  Urine Culture     Status: Abnormal   Collection Time: 06/01/20  8:57 AM   Specimen: Urine, Random  Result Value Ref Range Status   Specimen Description URINE, RANDOM  Final   Special Requests Normal  Final   Culture (A)  Final    <10,000 COLONIES/mL INSIGNIFICANT GROWTH Performed at [facility], [place] [address] [place], [place] [address]    Report Status 06/02/2020 FINAL  Final         Radiology Studies: DG CHEST PORT 1 VIEW  Result Date: 06/01/2020 CLINICAL DATA:  History of  previous pulmonary embolism with right ventricular strain, cough and fevers EXAM: PORTABLE CHEST 1 VIEW COMPARISON:  04/04/2020 FINDINGS: Cardiac shadow is stable. Tracheostomy tube and feeding catheter has been removed in the interval. Lungs are well aerated bilaterally. No focal infiltrate or sizable effusion is seen. Focal nodular density is again noted in the left upper lobe consistent with dilated pulmonary arterial branch. IMPRESSION: No acute abnormality noted. Electronically Signed   By: Inez Catalina M.D.   On: 06/01/2020 10:52        Scheduled Meds: . clonazePAM  0.5 mg Oral BID  . feeding supplement (ENSURE ENLIVE)  237 mL Oral  QID  . FLUoxetine  10 mg Oral QHS  . OXcarbazepine  75 mg Oral BID  . pantoprazole  40 mg Oral BID  . polyethylene glycol  17 g Oral Daily  . QUEtiapine  100 mg Oral QHS  . QUEtiapine  75 mg Oral Daily   Continuous Infusions: . sodium chloride    . sodium chloride Stopped (06/01/20 2200)  . ampicillin-sulbactam (UNASYN) IV 3 g (06/03/20 0622)     LOS: 88 days    Time spent: 35 minutes.     Elmarie Shiley, MD [facility]   If 7PM-7AM, please contact night-coverage [email or web address]  06/03/2020, 9:06 AM

## 2020-06-04 LAB — BASIC METABOLIC PANEL
Anion gap: 10 (ref 5–15)
BUN: 8 mg/dL (ref 8–23)
CO2: 24 mmol/L (ref 22–32)
Calcium: 8.4 mg/dL — ABNORMAL LOW (ref 8.9–10.3)
Chloride: 106 mmol/L (ref 98–111)
Creatinine, Ser: 0.98 mg/dL (ref 0.61–1.24)
GFR calc Af Amer: >60 mL/min (ref >60)
GFR calc non Af Amer: >60 mL/min (ref >60)
Glucose, Bld: 122 mg/dL — ABNORMAL HIGH (ref 70–99)
Potassium: 3.4 mmol/L — ABNORMAL LOW (ref 3.5–5.1)
Sodium: 140 mmol/L (ref 135–145)

## 2020-06-04 LAB — CBC
HCT: 24.6 % — ABNORMAL LOW (ref 39.0–52.0)
Hemoglobin: 7.8 g/dL — ABNORMAL LOW (ref 13.0–17.0)
MCH: 27.5 pg (ref 26.0–34.0)
MCHC: 31.7 g/dL (ref 30.0–36.0)
MCV: 86.6 fL (ref 80.0–100.0)
Platelets: 299 10*3/uL (ref 150–400)
RBC: 2.84 MIL/uL — ABNORMAL LOW (ref 4.22–5.81)
RDW: 17.1 % — ABNORMAL HIGH (ref 11.5–15.5)
WBC: 13.2 10*3/uL — ABNORMAL HIGH (ref 4.0–10.5)
nRBC: 0 % (ref 0.0–0.2)

## 2020-06-04 LAB — CULTURE, BLOOD (SINGLE): Special Requests: ADEQUATE

## 2020-06-04 LAB — AMMONIA: Ammonia: 18 umol/L (ref 9–35)

## 2020-06-04 MED ORDER — SENNOSIDES-DOCUSATE SODIUM 8.6-50 MG PO TABS
1.0000 | ORAL_TABLET | Freq: Two times a day (BID) | ORAL | Status: DC
  Administered 2020-06-04 – 2020-07-03 (×49): 1 via ORAL
  Filled 2020-06-04 (×54): qty 1

## 2020-06-04 MED ORDER — BISACODYL 5 MG PO TBEC: 5.0000 mg | DELAYED_RELEASE_TABLET | Freq: Every day | ORAL | Status: DC | PRN

## 2020-06-04 MED ORDER — POTASSIUM CHLORIDE CRYS ER 20 MEQ PO TBCR
40.0000 meq | EXTENDED_RELEASE_TABLET | Freq: Once | ORAL | Status: AC
  Administered 2020-06-04: 40 meq via ORAL
  Filled 2020-06-04: qty 2

## 2020-06-04 MED ORDER — POLYETHYLENE GLYCOL 3350 17 G PO PACK
17.0000 g | PACK | Freq: Two times a day (BID) | ORAL | Status: DC
  Administered 2020-06-04 – 2020-07-02 (×38): 17 g via ORAL
  Filled 2020-06-04 (×48): qty 1

## 2020-06-04 NOTE — Progress Notes (Signed)
PROGRESS NOTE    Kenneth Sullivan  YQM:[id number] DOB: 02/06/1956 DOA: 03/07/2020 PCP: Medicine, [name]   Brief Narrative: 64 year old with no prior medical history who had acute shortness of breath respiratory failure oxygen saturation in the 40s on EMS arrival.  He had a witnessed bradycardic, asystole arrest when EMS was present with 3 rounds of epi, 20 minutes of CPR prior to [place]. Underwent normothermia protocol.  Work-up was significant for massive PE, DVT with right ventricular strain.  Unfortunately hospital course was complicated by upper GI bleed due to gastric ulcer.  A CT abdomen pelvis showed a large antral mass and multiple liver lesions biopsy confirmed a high-grade neuroendocrine tumor, subsequent EGD with biopsy revealing adenocarcinoma.  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   For his massive PE patient underwent IR guided mechanical thrombectomy and IVC filter.  Post procedure, while on heparin drip patient is started having maroon-colored red stool and bloody drainage from OGT.  GI did endoscopy which showed oozing gastric ulcer with clots.  IR did GDA embolization.  Patient remained encephalopathic. On 3/27, MRI of the brain showed multifocal acute to subacute ischemic infarct involving bilateral cerebral hemisphere consistent with global hypoperfusion related to cardiac arrest, also associated with scatter petechial hemorrhage with evidence of hemorrhagic conversion in the parietal lobe. Abdomen and pelvis showed gastric lesion in the antrum concerning for antral mass and multiple liver lesion concerning for metastatic disease IR consulted and he underwent liver biopsy on 5/19.  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   Chest x ray repeated negative for PNA> requiring 2 L oxygen,.  2-Cardiac arrest: Secondary to massive PE On telemetry. Patient is not a long-term anticoagulation candidate given large gastric ulcer, secondary to neuroendocrine tumor given risk for massive GI bleed. Also history of a stroke with hemorrhagic conversion.  3-Massive PE with DVT status post IVC filter: Patient is not a long-term anticoagulation candidate given large gastric ulcer, secondary to neuroendocrine tumor given risk for massive GI bleed. Status post IVC filter placement on mechanical thrombectomy.  4-anoxic encephalopathy: Stable. Less confuse today.   5-Upper GI bleed secondary to large malignant gastric ulcer with acute blood loss anemia Underwent endoscopy x2.  Subsequently embolization of GDA by IR Continue with PPI. Endoscopy showed adenocarcinoma. Patient report bloody stool last night. HB drop today. Monitor.   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  The facility is not able to provide transportation to the cancer center for chemotherapy.  Discussed with Dr. Learta Codding consult Center might be able to provide with transportation. -We will follow up with Dr. Benay Spice tomorrow to see if cancer center will be able to provide transportation. -Will discussed CT finding with Dr Ammie Dalton.   7-Fever, Bacteremia;  Streptococcus.   Patient develops fever, mild leukocytosis.  Blood culture growing streptococcus.  UA with more than 50 WBC. Insignificant growth.  Source could be GI from cancer. Discussed with ID will change IV antibiotics to Unasyn. CT abdomen pelvis showed enlargement of metastatic liver diseases and new liver mass. Moderate stool burden.  WBC trending down now.   Tachycardia; related to fever. Hypovolemia. Hypotension Received fluids.  Vitals stable today.  On IV ceftriaxone.  Follow WBC<   Dysphagia: Currently on regular diet  Delusion hallucination: Psych consulted.  At times he gets agitated.  He is  Improved. Continue with Seroquel Severe malnutrition: Etiology chronic illness gastric lesion, multiple evaluation.  Continue with Ensure  Constipation schedule BR Hypokalemia replete orally Nutrition Problem: Severe Malnutrition Etiology: chronic illness (gastric lesion, multiple liver lesions, concern for metastatic disease)    Signs/Symptoms: severe fat depletion, severe muscle depletion    Interventions: Ensure Enlive (each supplement provides 350kcal and 20 grams of protein), Magic cup  Estimated body mass index is 18.87 kg/m as calculated from the following:   Height as of this encounter: 6\' 2"  (1.88 m).   Weight as of this encounter: 66.7 kg.   DVT prophylaxis: SCDs Code Status: DNR Family Communication: Discussed with patient Disposition Plan:  Status is: Inpatient  Remains inpatient appropriate because:Awaiting placement   Dispo: The patient is from: Home              Anticipated d/c is to: ALF              Anticipated d/c date is: 1 day              Patient currently is not medically stable to d/c. Patient with fever, tachycardia, treating for UTI.   Discussed with Dr. Learta Codding, he will see if cancer center can help with transportation for chemotherapy        Consultants:  [facility] Oncology Palliative  Procedures:  Significant events: 3/23>> Admit, EGD with  findings of oozing gastric ulcer with clot, IR for mechanical thrombectomy, IVC filter and GDA embolization 4/5>>Trach 4/28>>trach decannulated  Significant studies: CTA 3/23 >> bilateral PE with RV/with ratio 1.9, emphysema CT head 3/23 >>chronic microvascular changes. No acute findings Echo 3/23 >> RV is severely dilated with reduced function and D-shaped septum, LVEF 60-65%. Venogram 3/23>> occlusive DVT of the iliac vein extending to the lower IVC LE Venous Duplex 3/24 >>acute DVT in BLE up to the femoral vein  MRI Brain 3/28 >> multifocal acute to subacute ischemic infarcts involving bilateral cerebral hemispheres, findings consistent with global hypoperfusion event related to cardiac arrest, associated scattered petechial hemorrhage about many areas of ischemia, evidence of hemorrhagic conversion in the right parietal lobe   Antimicrobials:  Doxycycline from 4/9 on 12/17  Subjective: Less confuse.  Denies abdominal pain.  Appetite improved   Objective: Vitals:   06/03/20 1700 06/03/20 2100 06/04/20 0757 06/04/20 0940  BP:  110/70 112/75   Pulse:  (!) 115 99   Resp:  18 16   Temp: 98.1 F (36.7 C) 99 F (37.2 C) (!) 100.5 F (38.1 C) 98.4 F (36.9 C)  TempSrc:  Oral  Oral  SpO2:  92% 92%   Weight:      Height:        Intake/Output Summary (Last 24 hours) at 06/04/2020 1314 Last data filed at 06/04/2020 1021 Gross per 24 hour  Intake --  Output 600 ml  Net -600 ml   Filed Weights   05/30/20  0500 05/31/20 0500 06/01/20 0254  Weight: 67.6 kg 63.5 kg 66.7 kg    Examination:  General exam: NAD Respiratory system: CTA Cardiovascular system: S 1, S 2 RRR Gastrointestinal system: BS present, soft, nt Central nervous system: Alert Extremities: Symmetric power Skin: no rashes.    Data Reviewed: I have personally reviewed following labs and imaging studies  CBC: Recent Labs  Lab 05/28/20 2353 05/31/20 1102 06/02/20 0337 06/03/20 1134 06/04/20 1058   WBC 7.7 11.8* 18.2* 17.5* 13.2*  HGB 8.9* 9.7* 8.3* 9.5* 7.8*  HCT 27.6* 30.2* 26.0* 30.1* 24.6*  MCV 89.0 88.0 88.4 87.5 86.6  PLT 328 342 289 289 161   Basic Metabolic Panel: Recent Labs  Lab 05/28/20 2353 06/02/20 0337 06/03/20 1134 06/04/20 1058  NA 139 138 139 140  K 3.7 3.8 3.8 3.4*  CL 107 105 104 106  CO2 24 24 26 24   GLUCOSE 98 101* 98 122*  BUN 12 10 11 8   CREATININE 0.85 0.97 0.87 0.98  CALCIUM 8.7* 8.4* 8.7* 8.4*  MG 1.7  --   --   --    GFR: Estimated Creatinine Clearance: 73.7 mL/min (by C-G formula based on SCr of 0.98 mg/dL). Liver Function Tests: Recent Labs  Lab 05/28/20 2353  AST 18  ALT 10  ALKPHOS 66  BILITOT 0.4  PROT 5.3*  ALBUMIN 2.4*   No results for input(s): LIPASE, AMYLASE in the last 168 hours. Recent Labs  Lab 06/04/20 1058  AMMONIA 18   Coagulation Profile: No results for input(s): INR, PROTIME in the last 168 hours. Cardiac Enzymes: No results for input(s): CKTOTAL, CKMB, CKMBINDEX, TROPONINI in the last 168 hours. BNP (last 3 results) No results for input(s): PROBNP in the last 8760 hours. HbA1C: No results for input(s): HGBA1C in the last 72 hours. CBG: No results for input(s): GLUCAP in the last 168 hours. Lipid Profile: No results for input(s): CHOL, HDL, LDLCALC, TRIG, CHOLHDL, LDLDIRECT in the last 72 hours. Thyroid Function Tests: No results for input(s): TSH, T4TOTAL, FREET4, T3FREE, THYROIDAB in the last 72 hours. Anemia Panel: No results for input(s): VITAMINB12, FOLATE, FERRITIN, TIBC, IRON, RETICCTPCT in the last 72 hours. Sepsis Labs: Recent Labs  Lab 06/01/20 1744  LATICACIDVEN 1.6    Recent Results (from the past 240 hour(s))  SARS CORONAVIRUS 2 (TAT 6-24 HRS) Nasopharyngeal Nasopharyngeal Swab     Status: None   Collection Time: 05/30/20 11:25 AM   Specimen: Nasopharyngeal Swab  Result Value Ref Range Status   SARS Coronavirus 2 NEGATIVE NEGATIVE Final    Comment: (NOTE) SARS-CoV-2 target nucleic  acids are NOT DETECTED.  The SARS-CoV-2 RNA is generally detectable in upper and lower respiratory specimens during the acute phase of infection. Negative results do not preclude SARS-CoV-2 infection, do not rule out co-infections with other pathogens, and should not be used as the sole basis for treatment or other patient management decisions. Negative results must be combined with clinical observations, patient history, and epidemiological information. The expected result is Negative.  Fact Sheet for Patients: SugarRoll.be  Fact Sheet for Healthcare Providers: https://www.woods-mathews.com/  This test is not yet approved or cleared by the [place] FDA and  has been authorized for detection and/or diagnosis of SARS-CoV-2 by FDA under an Emergency Use Authorization (EUA). This EUA will remain  in effect (meaning this test can be used) for the duration of the COVID-19 declaration under Se ction 564(b)(1) of the Act, 21 U.S.C. section 360bbb-3(b)(1), unless the authorization is terminated or  revoked sooner.  Performed at [facility], [place] [address], [place], [place] [address]   Culture, blood (single)     Status: Abnormal   Collection Time: 06/01/20  5:37 AM   Specimen: BLOOD LEFT HAND  Result Value Ref Range Status   Specimen Description BLOOD LEFT HAND  Final   Special Requests   Final    BOTTLES DRAWN AEROBIC AND ANAEROBIC Blood Culture adequate volume   Culture  Setup Time   Final    IN BOTH AEROBIC AND ANAEROBIC BOTTLES GRAM POSITIVE COCCI IN CHAINS CRITICAL RESULT CALLED TO, READ BACK BY AND VERIFIED WITH: G ABBOTT PHARMD 06/02/20 0616 JDW    Culture (A)  Final    STREPTOCOCCUS CONSTELLATUS THE SIGNIFICANCE OF ISOLATING THIS ORGANISM FROM A SINGLE SET OF BLOOD CULTURES WHEN MULTIPLE SETS ARE DRAWN IS UNCERTAIN. PLEASE NOTIFY THE MICROBIOLOGY DEPARTMENT WITHIN ONE WEEK IF SPECIATION AND SENSITIVITIES ARE  REQUIRED. Performed at [facility], [place] [address] [place], [place] [address]    Report Status 06/03/2020 FINAL  Final  Blood Culture ID Panel (Reflexed)     Status: Abnormal   Collection Time: 06/01/20  5:37 AM  Result Value Ref Range Status   Enterococcus species NOT DETECTED NOT DETECTED Final   Listeria monocytogenes NOT DETECTED NOT DETECTED Final   Staphylococcus species NOT DETECTED NOT DETECTED Final   Staphylococcus aureus (BCID) NOT DETECTED NOT DETECTED Final   Streptococcus species DETECTED (A) NOT DETECTED Final    Comment: Not Enterococcus species, Streptococcus agalactiae, Streptococcus pyogenes, or Streptococcus pneumoniae. CRITICAL RESULT CALLED TO, READ BACK BY AND VERIFIED WITH: G ABBOTT PHARMD 06/02/20 0616 JDW    Streptococcus agalactiae NOT DETECTED NOT DETECTED Final   Streptococcus pneumoniae NOT DETECTED NOT DETECTED Final   Streptococcus pyogenes NOT DETECTED NOT DETECTED Final   Acinetobacter baumannii NOT DETECTED NOT DETECTED Final   Enterobacteriaceae species NOT DETECTED NOT DETECTED Final   Enterobacter cloacae complex NOT DETECTED NOT DETECTED Final   Escherichia coli NOT DETECTED NOT DETECTED Final   Klebsiella oxytoca NOT DETECTED NOT DETECTED Final   Klebsiella pneumoniae NOT DETECTED NOT DETECTED Final   Proteus species NOT DETECTED NOT DETECTED Final   Serratia marcescens NOT DETECTED NOT DETECTED Final   Haemophilus influenzae NOT DETECTED NOT DETECTED Final   Neisseria meningitidis NOT DETECTED NOT DETECTED Final   Pseudomonas aeruginosa NOT DETECTED NOT DETECTED Final   Candida albicans NOT DETECTED NOT DETECTED Final   Candida glabrata NOT DETECTED NOT DETECTED Final   Candida krusei NOT DETECTED NOT DETECTED Final   Candida parapsilosis NOT DETECTED NOT DETECTED Final   Candida tropicalis NOT DETECTED NOT DETECTED Final    Comment: Performed at [facility], [place] [address] [place], [place] [address]  Urine Culture      Status: Abnormal   Collection Time: 06/01/20  8:57 AM   Specimen: Urine, Random  Result Value Ref Range Status   Specimen Description URINE, RANDOM  Final   Special Requests Normal  Final   Culture (A)  Final    <10,000 COLONIES/mL INSIGNIFICANT GROWTH Performed at [facility], [place] [address] [place], [place] [address]    Report Status 06/02/2020 FINAL  Final  Culture, blood (Routine X 2) w Reflex to ID Panel     Status: None (Preliminary result)   Collection Time: 06/02/20 10:59 AM   Specimen: BLOOD  Result Value Ref Range Status   Specimen Description BLOOD LEFT ANTECUBITAL  Final  Special Requests   Final    BOTTLES DRAWN AEROBIC ONLY Blood Culture results may not be optimal due to an inadequate volume of blood received in culture bottles   Culture   Final    NO GROWTH 2 DAYS Performed at [facility], [place] [address] [place], [place] [address]    Report Status PENDING  Incomplete  Culture, blood (Routine X 2) w Reflex to ID Panel     Status: Abnormal   Collection Time: 06/02/20 10:59 AM   Specimen: BLOOD  Result Value Ref Range Status   Specimen Description BLOOD RIGHT ANTECUBITAL  Final   Special Requests   Final    BOTTLES DRAWN AEROBIC ONLY Blood Culture adequate volume   Culture  Setup Time   Final    GRAM POSITIVE COCCI AEROBIC BOTTLE ONLY CRITICAL VALUE NOTED.  VALUE IS CONSISTENT WITH PREVIOUSLY REPORTED AND CALLED VALUE.    Culture (A)  Final    STAPHYLOCOCCUS SPECIES (COAGULASE NEGATIVE) THE SIGNIFICANCE OF ISOLATING THIS ORGANISM FROM A SINGLE SET OF BLOOD CULTURES WHEN MULTIPLE SETS ARE DRAWN IS UNCERTAIN. PLEASE NOTIFY THE MICROBIOLOGY DEPARTMENT WITHIN ONE WEEK IF SPECIATION AND SENSITIVITIES ARE REQUIRED. Performed at [facility], [place] [address] [place], [place] [address]    Report Status 06/04/2020 FINAL  Final         Radiology Studies: CT ABDOMEN PELVIS W CONTRAST  Result Date: 06/04/2020 CLINICAL DATA:  Abdominal pain.  Fever of unknown origin. Upper GI bleed secondary to large malignant gastric ulcer with acute blood loss anemia. Bloody stool last night. EXAM: CT ABDOMEN AND PELVIS WITH CONTRAST TECHNIQUE: Multidetector CT imaging of the abdomen and pelvis was performed using the standard protocol following bolus administration of intravenous contrast. CONTRAST:  160mL OMNIPAQUE IOHEXOL 300 MG/ML  SOLN COMPARISON:  Abdominal CT 05/01/2020 FINDINGS: Lower chest: Small right pleural effusion which has slightly increased from CT last month. Trace left pleural effusion which is new. Scattered basilar atelectasis. Heart is normal in size. Hepatobiliary: Increased size of dominant left lobe liver lesion since last month, currently 8 cm, previously 5.4 cm. Subcapsular right lobe lesion is also increased in size currently 4.5 cm, previously 2.4 cm. Smaller subcapsular lesion in the right lobe is also slightly increased in size. There are new lesions in the central right lobe, series 3, image 11, and possibly left lobe series 3, image 26. Gallbladder physiologically distended. No biliary dilatation. Pancreas: No ductal dilatation or inflammation. Spleen: Normal in size without focal abnormality. Adrenals/Urinary Tract: Normal adrenal glands. No hydronephrosis or perinephric edema. Homogeneous renal enhancement with symmetric excretion on delayed phase imaging. Left renal cyst unchanged from prior exam. Urinary bladder is partially distended without wall thickening. Stomach/Bowel: The stomach is nondistended. The known gastric mass is difficult to delineate by CT, but roughly in the region of series 3, image 24. There is no obvious gastric wall thickening. No small bowel obstruction. Administered enteric contrast reaches the colon. No small bowel inflammation. Moderate colonic stool burden without colonic wall thickening. There is sigmoid colonic tortuosity. Vascular/Lymphatic: Infrarenal IVC filter in place. Aortic atherosclerosis without  aneurysm. The portal vein is patent. Embolization coils in the upper abdomen. No bulky abdominopelvic adenopathy. Reproductive: Prostate is unremarkable. Other: Trace loculated subcapsular fluid adjacent to the left lobe of the liver. No other ascites. Trace presacral edema. Generalized body wall edema in the flanks. No free intra-abdominal air or abscess. Musculoskeletal: Mild chronic L2 superior endplate compression fracture, unchanged. No acute osseous abnormality  or focal bone lesion. IMPRESSION: 1. Increased size of hepatic metastasis since CT last month. There is also a new small liver lesion. 2. The known gastric mass is difficult to delineate on the current exam given nondistended stomach. 3. Small right and trace left pleural effusions, slightly increased from CT last month. 4. Moderate colonic stool burden with sigmoid colonic tortuosity, can be seen with constipation. No bowel obstruction or inflammation. Aortic Atherosclerosis (ICD10-I70.0). Electronically Signed   By: Keith Rake M.D.   On: 06/04/2020 00:50        Scheduled Meds: . clonazePAM  0.5 mg Oral BID  . feeding supplement (ENSURE ENLIVE)  237 mL Oral QID  . FLUoxetine  10 mg Oral QHS  . OXcarbazepine  75 mg Oral BID  . pantoprazole  40 mg Oral BID  . polyethylene glycol  17 g Oral BID  . QUEtiapine  100 mg Oral QHS  . QUEtiapine  75 mg Oral Daily  . senna-docusate  1 tablet Oral BID   Continuous Infusions: . sodium chloride    . sodium chloride Stopped (06/01/20 2200)  . ampicillin-sulbactam (UNASYN) IV 3 g (06/04/20 0904)     LOS: 89 days    Time spent: 35 minutes.     Elmarie Shiley, MD [facility]   If 7PM-7AM, please contact night-coverage [email or web address]  06/04/2020, 1:14 PM

## 2020-06-05 LAB — COMPREHENSIVE METABOLIC PANEL
ALT: 10 U/L (ref 0–44)
AST: 18 U/L (ref 15–41)
Albumin: 1.9 g/dL — ABNORMAL LOW (ref 3.5–5.0)
Alkaline Phosphatase: 86 U/L (ref 38–126)
Anion gap: 9 (ref 5–15)
BUN: 7 mg/dL — ABNORMAL LOW (ref 8–23)
CO2: 24 mmol/L (ref 22–32)
Calcium: 8.2 mg/dL — ABNORMAL LOW (ref 8.9–10.3)
Chloride: 108 mmol/L (ref 98–111)
Creatinine, Ser: 0.86 mg/dL (ref 0.61–1.24)
GFR calc Af Amer: >60 mL/min (ref >60)
GFR calc non Af Amer: >60 mL/min (ref >60)
Glucose, Bld: 98 mg/dL (ref 70–99)
Potassium: 4.6 mmol/L (ref 3.5–5.1)
Sodium: 141 mmol/L (ref 135–145)
Total Bilirubin: 0.8 mg/dL (ref 0.3–1.2)
Total Protein: 5.3 g/dL — ABNORMAL LOW (ref 6.5–8.1)

## 2020-06-05 LAB — CBC
HCT: 23.8 % — ABNORMAL LOW (ref 39.0–52.0)
Hemoglobin: 7.6 g/dL — ABNORMAL LOW (ref 13.0–17.0)
MCH: 27.9 pg (ref 26.0–34.0)
MCHC: 31.9 g/dL (ref 30.0–36.0)
MCV: 87.5 fL (ref 80.0–100.0)
Platelets: 313 10*3/uL (ref 150–400)
RBC: 2.72 MIL/uL — ABNORMAL LOW (ref 4.22–5.81)
RDW: 17.2 % — ABNORMAL HIGH (ref 11.5–15.5)
WBC: 13.4 10*3/uL — ABNORMAL HIGH (ref 4.0–10.5)
nRBC: 0 % (ref 0.0–0.2)

## 2020-06-05 LAB — MAGNESIUM: Magnesium: 1.9 mg/dL (ref 1.7–2.4)

## 2020-06-05 MED ORDER — FERROUS SULFATE 325 (65 FE) MG PO TABS
325.0000 mg | ORAL_TABLET | Freq: Every day | ORAL | Status: DC
  Administered 2020-06-06 – 2020-07-01 (×25): 325 mg via ORAL
  Filled 2020-06-05 (×27): qty 1

## 2020-06-05 MED ORDER — BISACODYL 5 MG PO TBEC
5.0000 mg | DELAYED_RELEASE_TABLET | Freq: Once | ORAL | Status: AC
  Administered 2020-06-05: 5 mg via ORAL
  Filled 2020-06-05: qty 1

## 2020-06-05 NOTE — Progress Notes (Signed)
PROGRESS NOTE    Kenneth Sullivan  MVE:[id number] DOB: 1956-11-26 DOA: 03/07/2020 PCP: Medicine, [name]   Brief Narrative: 64 year old with no prior medical history who had acute shortness of breath respiratory failure oxygen saturation in the 40s on EMS arrival.  He had a witnessed bradycardic, asystole arrest when EMS was present with 3 rounds of epi, 20 minutes of CPR prior to [place]. Underwent normothermia protocol.  Work-up was significant for massive PE, DVT with right ventricular strain.  Unfortunately hospital course was complicated by upper GI bleed due to gastric ulcer.  A CT abdomen pelvis showed a large antral mass and multiple liver lesions biopsy confirmed a high-grade neuroendocrine tumor, subsequent EGD with biopsy revealing adenocarcinoma.  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   For his massive PE patient underwent IR guided mechanical thrombectomy and IVC filter.  Post procedure, while on heparin drip patient is started having maroon-colored red stool and bloody drainage from OGT.  GI did endoscopy which showed oozing gastric ulcer with clots.  IR did GDA embolization.  Patient remained encephalopathic. On 3/27, MRI of the brain showed multifocal acute to subacute ischemic infarct involving bilateral cerebral hemisphere consistent with global hypoperfusion related to cardiac arrest, also associated with scatter petechial hemorrhage with evidence of hemorrhagic conversion in the parietal lobe. Abdomen and pelvis showed gastric lesion in the antrum concerning for antral mass and multiple liver lesion concerning for metastatic disease IR consulted and he underwent liver biopsy on 5/19.  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   Chest x ray repeated negative for PNA> requiring 2 L oxygen,.  2-Cardiac arrest: Secondary to massive PE On telemetry. Patient is not a long-term anticoagulation candidate given large gastric ulcer, secondary to neuroendocrine tumor given risk for massive GI bleed. Also history of a stroke with hemorrhagic conversion.  3-Massive PE with DVT status post IVC filter: Patient is not a long-term anticoagulation candidate given large gastric ulcer, secondary to neuroendocrine tumor given risk for massive GI bleed. Status post IVC filter placement on mechanical thrombectomy.  4-anoxic encephalopathy: Stable. Less confuse today.   5-Upper GI bleed secondary to large malignant gastric ulcer with acute blood loss anemia Underwent endoscopy x2.  Subsequently embolization of GDA by IR Continue with PPI. Endoscopy showed adenocarcinoma. Patient report bloody stool last night. Hb stable monitor. Start ferrous sulfate.   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  The facility is not able to provide transportation to the cancer center for chemotherapy.  Discussed with Dr. Learta Codding consult Center might be able to provide with transportation. -Cancer center will be able to provide transportation from ALF to cancer center for [place].  CT showed increase size of liver metastatic diseases.   7-Fever, Bacteremia;  Streptococcus.  Patient develops fever, mild leukocytosis.  Blood culture growing streptococcus.  UA with more than 50 WBC. Insignificant growth.  Source could be GI from cancer. Discussed with ID will change IV antibiotics to Unasyn. CT abdomen pelvis showed enlargement of metastatic liver diseases and new liver mass. Moderate stool burden.  WBC trending down now.  Continue with Unasyn.  Fever curve decreasing.   Tachycardia; related to fever. Hypovolemia. Hypotension Received fluids.  Vitals stable today.  On IV ceftriaxone.  Follow WBC<   Dysphagia: Currently on regular diet  Delusion hallucination: Psych consulted.  At times he gets agitated.  He is  Improved. Continue with Seroquel Severe malnutrition: Etiology chronic illness gastric lesion, multiple evaluation.  Continue with Ensure  Constipation schedule BR Hypokalemia replete orally Nutrition Problem: Severe Malnutrition Etiology: chronic illness (gastric lesion, multiple liver lesions, concern for metastatic disease)    Signs/Symptoms: severe fat depletion, severe muscle depletion    Interventions: Ensure Enlive (each supplement provides 350kcal and 20 grams of protein), Magic cup  Estimated body mass index is 18.87 kg/m as calculated from the following:   Height as of this encounter: 6\' 2"  (1.88 m).   Weight as of this encounter: 66.7 kg.   DVT prophylaxis: SCDs Code Status: DNR Family Communication: Discussed with patient Disposition Plan:  Status is: Inpatient  Remains inpatient appropriate because:Awaiting placement   Dispo: The patient is from: Home              Anticipated d/c is to: ALF              Anticipated d/c date is: 1 day              Patient currently is not medically stable to d/c. patient with fever from streptococcus bacteremia.         Consultants:  [facility] Oncology Palliative  Procedures:  Significant events: 3/23>> Admit, EGD with findings of oozing gastric ulcer with  clot, IR for mechanical thrombectomy, IVC filter and GDA embolization 4/5>>Trach 4/28>>trach decannulated  Significant studies: CTA 3/23 >> bilateral PE with RV/with ratio 1.9, emphysema CT head 3/23 >>chronic microvascular changes. No acute findings Echo 3/23 >> RV is severely dilated with reduced function and D-shaped septum, LVEF 60-65%. Venogram 3/23>> occlusive DVT of the iliac vein extending to the lower IVC LE Venous Duplex 3/24 >>acute DVT in BLE up to the femoral vein  MRI Brain 3/28 >> multifocal acute to subacute ischemic infarcts involving bilateral cerebral hemispheres, findings consistent with global hypoperfusion event related to cardiac arrest, associated scattered petechial hemorrhage about many areas of ischemia, evidence of hemorrhagic conversion in the right parietal lobe   Antimicrobials:  Doxycycline from 4/9 on 12/17  Subjective: Confuse on and off. T max 100.  Denies abdominal pain   Objective: Vitals:   06/04/20 0940 06/04/20 1559 06/04/20 2100 06/05/20 0751  BP:  119/79 110/67 111/78  Pulse:  (!) 109 99 (!) 105  Resp:  16 14 14   Temp: 98.4 F (36.9 C) 99 F (37.2 C) 98.4 F (36.9 C) 99.7 F (37.6 C)  TempSrc: Oral  Oral   SpO2:  95% 92% 94%  Weight:      Height:        Intake/Output Summary (Last 24 hours) at 06/05/2020 1332 Last data filed at 06/05/2020 0829 Gross per 24 hour  Intake --  Output 351 ml  Net -351 ml   Filed Weights   05/30/20 0500 05/31/20 0500 06/01/20 0254  Weight: 67.6 kg  63.5 kg 66.7 kg    Examination:  General exam: NAD Respiratory system: CTA Cardiovascular system: S 1, S 2 RRR Gastrointestinal system: BS present, soft, nt Central nervous system: Alert Extremities: Symmetric power Skin: no rashes.    Data Reviewed: I have personally reviewed following labs and imaging studies  CBC: Recent Labs  Lab 05/31/20 1102 06/02/20 0337 06/03/20 1134 06/04/20 1058 06/05/20 0039  WBC 11.8* 18.2* 17.5* 13.2*  13.4*  HGB 9.7* 8.3* 9.5* 7.8* 7.6*  HCT 30.2* 26.0* 30.1* 24.6* 23.8*  MCV 88.0 88.4 87.5 86.6 87.5  PLT 342 289 289 299 093   Basic Metabolic Panel: Recent Labs  Lab 06/02/20 0337 06/03/20 1134 06/04/20 1058 06/05/20 0039  NA 138 139 140 141  K 3.8 3.8 3.4* 4.6  CL 105 104 106 108  CO2 24 26 24 24   GLUCOSE 101* 98 122* 98  BUN 10 11 8  7*  CREATININE 0.97 0.87 0.98 0.86  CALCIUM 8.4* 8.7* 8.4* 8.2*  MG  --   --   --  1.9   GFR: Estimated Creatinine Clearance: 84 mL/min (by C-G formula based on SCr of 0.86 mg/dL). Liver Function Tests: Recent Labs  Lab 06/05/20 0039  AST 18  ALT 10  ALKPHOS 86  BILITOT 0.8  PROT 5.3*  ALBUMIN 1.9*   No results for input(s): LIPASE, AMYLASE in the last 168 hours. Recent Labs  Lab 06/04/20 1058  AMMONIA 18   Coagulation Profile: No results for input(s): INR, PROTIME in the last 168 hours. Cardiac Enzymes: No results for input(s): CKTOTAL, CKMB, CKMBINDEX, TROPONINI in the last 168 hours. BNP (last 3 results) No results for input(s): PROBNP in the last 8760 hours. HbA1C: No results for input(s): HGBA1C in the last 72 hours. CBG: No results for input(s): GLUCAP in the last 168 hours. Lipid Profile: No results for input(s): CHOL, HDL, LDLCALC, TRIG, CHOLHDL, LDLDIRECT in the last 72 hours. Thyroid Function Tests: No results for input(s): TSH, T4TOTAL, FREET4, T3FREE, THYROIDAB in the last 72 hours. Anemia Panel: No results for input(s): VITAMINB12, FOLATE, FERRITIN, TIBC, IRON, RETICCTPCT in the last 72 hours. Sepsis Labs: Recent Labs  Lab 06/01/20 1744  LATICACIDVEN 1.6    Recent Results (from the past 240 hour(s))  SARS CORONAVIRUS 2 (TAT 6-24 HRS) Nasopharyngeal Nasopharyngeal Swab     Status: None   Collection Time: 05/30/20 11:25 AM   Specimen: Nasopharyngeal Swab  Result Value Ref Range Status   SARS Coronavirus 2 NEGATIVE NEGATIVE Final    Comment: (NOTE) SARS-CoV-2 target nucleic acids are NOT  DETECTED.  The SARS-CoV-2 RNA is generally detectable in upper and lower respiratory specimens during the acute phase of infection. Negative results do not preclude SARS-CoV-2 infection, do not rule out co-infections with other pathogens, and should not be used as the sole basis for treatment or other patient management decisions. Negative results must be combined with clinical observations, patient history, and epidemiological information. The expected result is Negative.  Fact Sheet for Patients: SugarRoll.be  Fact Sheet for Healthcare Providers: https://www.woods-mathews.com/  This test is not yet approved or cleared by the [place] FDA and  has been authorized for detection and/or diagnosis of SARS-CoV-2 by FDA under an Emergency Use Authorization (EUA). This EUA will remain  in effect (meaning this test can be used) for the duration of the COVID-19 declaration under Se ction 564(b)(1) of the Act, 21 U.S.C. section 360bbb-3(b)(1), unless the authorization is terminated or revoked sooner.  Performed at [facility],  [address], [place], [place] [address]   Culture, blood (single)     Status: Abnormal   Collection Time: 06/01/20  5:37 AM   Specimen: BLOOD LEFT HAND  Result Value Ref Range Status   Specimen Description BLOOD LEFT HAND  Final   Special Requests   Final    BOTTLES DRAWN AEROBIC AND ANAEROBIC Blood Culture adequate volume   Culture  Setup Time   Final    IN BOTH AEROBIC AND ANAEROBIC BOTTLES GRAM POSITIVE COCCI IN CHAINS CRITICAL RESULT CALLED TO, READ BACK BY AND VERIFIED WITH: G ABBOTT PHARMD 06/02/20 0616 JDW    Culture (A)  Final    STREPTOCOCCUS CONSTELLATUS THE SIGNIFICANCE OF ISOLATING THIS ORGANISM FROM A SINGLE SET OF BLOOD CULTURES WHEN MULTIPLE SETS ARE DRAWN IS UNCERTAIN. PLEASE NOTIFY THE MICROBIOLOGY DEPARTMENT WITHIN ONE WEEK IF SPECIATION AND SENSITIVITIES ARE REQUIRED. Performed at [facility], [place] [address] [place], [place] [address]    Report Status 06/03/2020 FINAL  Final  Blood Culture ID Panel (Reflexed)     Status: Abnormal   Collection Time: 06/01/20  5:37 AM  Result Value Ref Range Status   Enterococcus species NOT DETECTED NOT DETECTED Final   Listeria monocytogenes NOT DETECTED NOT DETECTED Final   Staphylococcus species NOT DETECTED NOT DETECTED Final   Staphylococcus aureus (BCID) NOT DETECTED NOT DETECTED Final   Streptococcus species DETECTED (A) NOT DETECTED Final    Comment: Not Enterococcus species, Streptococcus agalactiae, Streptococcus pyogenes, or Streptococcus pneumoniae. CRITICAL RESULT CALLED TO, READ BACK BY AND VERIFIED WITH: G ABBOTT PHARMD 06/02/20 0616 JDW    Streptococcus agalactiae NOT DETECTED NOT DETECTED Final   Streptococcus pneumoniae NOT DETECTED NOT DETECTED Final   Streptococcus pyogenes NOT DETECTED NOT DETECTED Final   Acinetobacter baumannii NOT DETECTED NOT DETECTED Final   Enterobacteriaceae species NOT DETECTED NOT DETECTED Final   Enterobacter cloacae complex NOT DETECTED NOT DETECTED Final   Escherichia coli NOT DETECTED NOT DETECTED Final   Klebsiella oxytoca NOT DETECTED NOT DETECTED Final   Klebsiella pneumoniae NOT DETECTED NOT DETECTED Final   Proteus species NOT DETECTED NOT DETECTED Final   Serratia marcescens NOT DETECTED NOT DETECTED Final   Haemophilus influenzae NOT DETECTED NOT DETECTED Final   Neisseria meningitidis NOT DETECTED NOT DETECTED Final   Pseudomonas aeruginosa NOT DETECTED NOT DETECTED Final   Candida albicans NOT DETECTED NOT DETECTED Final   Candida glabrata NOT DETECTED NOT DETECTED Final   Candida krusei NOT DETECTED NOT DETECTED Final   Candida parapsilosis NOT DETECTED NOT DETECTED Final   Candida tropicalis NOT DETECTED NOT DETECTED Final    Comment: Performed at [facility], [place] [address] [place], [place] [address]  Urine Culture     Status: Abnormal   Collection Time:  06/01/20  8:57 AM   Specimen: Urine, Random  Result Value Ref Range Status   Specimen Description URINE, RANDOM  Final   Special Requests Normal  Final   Culture (A)  Final    <10,000 COLONIES/mL INSIGNIFICANT GROWTH Performed at [facility], [place] [address] [place], [place] [address]    Report Status 06/02/2020 FINAL  Final  Culture, blood (Routine X 2) w Reflex to ID Panel     Status: None (Preliminary result)   Collection Time: 06/02/20 10:59 AM   Specimen: BLOOD  Result Value Ref Range Status   Specimen Description BLOOD LEFT ANTECUBITAL  Final   Special Requests   Final  BOTTLES DRAWN AEROBIC ONLY Blood Culture results may not be optimal due to an inadequate volume of blood received in culture bottles   Culture   Final    NO GROWTH 3 DAYS Performed at [facility], [place] [address], [place], [place] [address]    Report Status PENDING  Incomplete  Culture, blood (Routine X 2) w Reflex to ID Panel     Status: Abnormal   Collection Time: 06/02/20 10:59 AM   Specimen: BLOOD  Result Value Ref Range Status   Specimen Description BLOOD RIGHT ANTECUBITAL  Final   Special Requests   Final    BOTTLES DRAWN AEROBIC ONLY Blood Culture adequate volume   Culture  Setup Time   Final    GRAM POSITIVE COCCI AEROBIC BOTTLE ONLY CRITICAL VALUE NOTED.  VALUE IS CONSISTENT WITH PREVIOUSLY REPORTED AND CALLED VALUE.    Culture (A)  Final    STAPHYLOCOCCUS SPECIES (COAGULASE NEGATIVE) THE SIGNIFICANCE OF ISOLATING THIS ORGANISM FROM A SINGLE SET OF BLOOD CULTURES WHEN MULTIPLE SETS ARE DRAWN IS UNCERTAIN. PLEASE NOTIFY THE MICROBIOLOGY DEPARTMENT WITHIN ONE WEEK IF SPECIATION AND SENSITIVITIES ARE REQUIRED. Performed at [facility], [place] [address], [place], [place] [address]    Report Status 06/04/2020 FINAL  Final         Radiology Studies: CT ABDOMEN PELVIS W CONTRAST  Result Date: 06/04/2020 CLINICAL DATA:  Abdominal pain. Fever of unknown origin. Upper GI bleed  secondary to large malignant gastric ulcer with acute blood loss anemia. Bloody stool last night. EXAM: CT ABDOMEN AND PELVIS WITH CONTRAST TECHNIQUE: Multidetector CT imaging of the abdomen and pelvis was performed using the standard protocol following bolus administration of intravenous contrast. CONTRAST:  135mL OMNIPAQUE IOHEXOL 300 MG/ML  SOLN COMPARISON:  Abdominal CT 05/01/2020 FINDINGS: Lower chest: Small right pleural effusion which has slightly increased from CT last month. Trace left pleural effusion which is new. Scattered basilar atelectasis. Heart is normal in size. Hepatobiliary: Increased size of dominant left lobe liver lesion since last month, currently 8 cm, previously 5.4 cm. Subcapsular right lobe lesion is also increased in size currently 4.5 cm, previously 2.4 cm. Smaller subcapsular lesion in the right lobe is also slightly increased in size. There are new lesions in the central right lobe, series 3, image 11, and possibly left lobe series 3, image 26. Gallbladder physiologically distended. No biliary dilatation. Pancreas: No ductal dilatation or inflammation. Spleen: Normal in size without focal abnormality. Adrenals/Urinary Tract: Normal adrenal glands. No hydronephrosis or perinephric edema. Homogeneous renal enhancement with symmetric excretion on delayed phase imaging. Left renal cyst unchanged from prior exam. Urinary bladder is partially distended without wall thickening. Stomach/Bowel: The stomach is nondistended. The known gastric mass is difficult to delineate by CT, but roughly in the region of series 3, image 24. There is no obvious gastric wall thickening. No small bowel obstruction. Administered enteric contrast reaches the colon. No small bowel inflammation. Moderate colonic stool burden without colonic wall thickening. There is sigmoid colonic tortuosity. Vascular/Lymphatic: Infrarenal IVC filter in place. Aortic atherosclerosis without aneurysm. The portal vein is patent.  Embolization coils in the upper abdomen. No bulky abdominopelvic adenopathy. Reproductive: Prostate is unremarkable. Other: Trace loculated subcapsular fluid adjacent to the left lobe of the liver. No other ascites. Trace presacral edema. Generalized body wall edema in the flanks. No free intra-abdominal air or abscess. Musculoskeletal: Mild chronic L2 superior endplate compression fracture, unchanged. No acute osseous abnormality or focal bone lesion. IMPRESSION: 1. Increased size  of hepatic metastasis since CT last month. There is also a new small liver lesion. 2. The known gastric mass is difficult to delineate on the current exam given nondistended stomach. 3. Small right and trace left pleural effusions, slightly increased from CT last month. 4. Moderate colonic stool burden with sigmoid colonic tortuosity, can be seen with constipation. No bowel obstruction or inflammation. Aortic Atherosclerosis (ICD10-I70.0). Electronically Signed   By: Keith Rake M.D.   On: 06/04/2020 00:50        Scheduled Meds: . clonazePAM  0.5 mg Oral BID  . feeding supplement (ENSURE ENLIVE)  237 mL Oral QID  . ferrous sulfate  325 mg Oral Q breakfast  . FLUoxetine  10 mg Oral QHS  . OXcarbazepine  75 mg Oral BID  . pantoprazole  40 mg Oral BID  . polyethylene glycol  17 g Oral BID  . QUEtiapine  100 mg Oral QHS  . QUEtiapine  75 mg Oral Daily  . senna-docusate  1 tablet Oral BID   Continuous Infusions: . sodium chloride    . sodium chloride Stopped (06/01/20 2200)  . ampicillin-sulbactam (UNASYN) IV 3 g (06/05/20 1006)     LOS: 90 days    Time spent: 35 minutes.     Elmarie Shiley, MD [facility]   If 7PM-7AM, please contact night-coverage [email or web address]  06/05/2020, 1:32 PM

## 2020-06-05 NOTE — Progress Notes (Addendum)
HEMATOLOGY-ONCOLOGY PROGRESS NOTE  SUBJECTIVE: Resting quietly at the time my visit.  Still having intermittent periods of confusion/agitation.  T-max 100.5 in the past 24 hours.  Remains on IV antibiotics.  PHYSICAL EXAMINATION:  Vitals:   06/04/20 2100 06/05/20 0751  BP: 110/67 111/78  Pulse: 99 (!) 105  Resp: 14 14  Temp: 98.4 F (36.9 C) 99.7 F (37.6 C)  SpO2: 92% 94%   Filed Weights   05/30/20 0500 05/31/20 0500 06/01/20 0254  Weight: 67.6 kg 63.5 kg 66.7 kg    Intake/Output from previous day: 06/20 0701 - 06/21 0700 In: -  Out: 601 [Urine:600; Stool:1]  GENERAL:alert, no distress and comfortable ABDOMEN:abdomen soft, non-tender and normal bowel sounds  NEURO: Intermittently confused with periods of agitation  LABORATORY DATA:  I have reviewed the data as listed CMP Latest Ref Rng & Units 06/05/2020 06/04/2020 06/03/2020  Glucose 70 - 99 mg/dL 98 122(H) 98  BUN 8 - 23 mg/dL 7(L) 8 11  Creatinine 0.61 - 1.24 mg/dL 0.86 0.98 0.87  Sodium 135 - 145 mmol/L 141 140 139  Potassium 3.5 - 5.1 mmol/L 4.6 3.4(L) 3.8  Chloride 98 - 111 mmol/L 108 106 104  CO2 22 - 32 mmol/L _0 Calcium 8.9 - 10.3 mg/dL 8.2(L) 8.4(L) 8.7(L)  Total Protein 6.5 - 8.1 g/dL 5.3(L) - -  Total Bilirubin 0.3 - 1.2 mg/dL 0.8 - -  Alkaline Phos 38 - 126 U/L 86 - -  AST 15 - 41 U/L 18 - -  ALT 0 - 44 U/L 10 - -    Lab Results  Component Value Date   WBC 13.4 (H) 06/05/2020   HGB 7.6 (L) 06/05/2020   HCT 23.8 (L) 06/05/2020   MCV 87.5 06/05/2020   PLT 313 06/05/2020   NEUTROABS 4.6 05/10/2020    CT ABDOMEN PELVIS W CONTRAST  Result Date: 06/04/2020 CLINICAL DATA:  Abdominal pain. Fever of unknown origin. Upper GI bleed secondary to large malignant gastric ulcer with acute blood loss anemia. Bloody stool last night. EXAM: CT ABDOMEN AND PELVIS WITH CONTRAST TECHNIQUE: Multidetector CT imaging of the abdomen and pelvis was performed using the standard protocol following bolus  administration of intravenous contrast. CONTRAST:  157m OMNIPAQUE IOHEXOL 300 MG/ML  SOLN COMPARISON:  Abdominal CT 05/01/2020 FINDINGS: Lower chest: Small right pleural effusion which has slightly increased from CT last month. Trace left pleural effusion which is new. Scattered basilar atelectasis. Heart is normal in size. Hepatobiliary: Increased size of dominant left lobe liver lesion since last month, currently 8 cm, previously 5.4 cm. Subcapsular right lobe lesion is also increased in size currently 4.5 cm, previously 2.4 cm. Smaller subcapsular lesion in the right lobe is also slightly increased in size. There are new lesions in the central right lobe, series 3, image 11, and possibly left lobe series 3, image 26. Gallbladder physiologically distended. No biliary dilatation. Pancreas: No ductal dilatation or inflammation. Spleen: Normal in size without focal abnormality. Adrenals/Urinary Tract: Normal adrenal glands. No hydronephrosis or perinephric edema. Homogeneous renal enhancement with symmetric excretion on delayed phase imaging. Left renal cyst unchanged from prior exam. Urinary bladder is partially distended without wall thickening. Stomach/Bowel: The stomach is nondistended. The known gastric mass is difficult to delineate by CT, but roughly in the region of series 3, image 24. There is no obvious gastric wall thickening. No small bowel obstruction. Administered enteric contrast reaches the colon. No small bowel inflammation. Moderate colonic stool burden without colonic wall thickening. There  is sigmoid colonic tortuosity. Vascular/Lymphatic: Infrarenal IVC filter in place. Aortic atherosclerosis without aneurysm. The portal vein is patent. Embolization coils in the upper abdomen. No bulky abdominopelvic adenopathy. Reproductive: Prostate is unremarkable. Other: Trace loculated subcapsular fluid adjacent to the left lobe of the liver. No other ascites. Trace presacral edema. Generalized body wall  edema in the flanks. No free intra-abdominal air or abscess. Musculoskeletal: Mild chronic L2 superior endplate compression fracture, unchanged. No acute osseous abnormality or focal bone lesion. IMPRESSION: 1. Increased size of hepatic metastasis since CT last month. There is also a new small liver lesion. 2. The known gastric mass is difficult to delineate on the current exam given nondistended stomach. 3. Small right and trace left pleural effusions, slightly increased from CT last month. 4. Moderate colonic stool burden with sigmoid colonic tortuosity, can be seen with constipation. No bowel obstruction or inflammation. Aortic Atherosclerosis (ICD10-I70.0). Electronically Signed   By: Keith Rake M.D.   On: 06/04/2020 00:50   DG CHEST PORT 1 VIEW  Result Date: 06/01/2020 CLINICAL DATA:  History of previous pulmonary embolism with right ventricular strain, cough and fevers EXAM: PORTABLE CHEST 1 VIEW COMPARISON:  04/04/2020 FINDINGS: Cardiac shadow is stable. Tracheostomy tube and feeding catheter has been removed in the interval. Lungs are well aerated bilaterally. No focal infiltrate or sizable effusion is seen. Focal nodular density is again noted in the left upper lobe consistent with dilated pulmonary arterial branch. IMPRESSION: No acute abnormality noted. Electronically Signed   By: Inez Catalina M.D.   On: 06/01/2020 10:52    ASSESSMENT AND PLAN: 1.    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  Biopsy was consistent with  adenocarcinoma with  neuroendocrine component.             -CT abdomen/pelvis 06/03/2020-increased size of hepatic metastases, new liver lesions 2.  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  Tracheostomy placement on 03/20/2020, now healed 4.  Cardiac arrest on 03/07/2020 5.  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  History of acute blood loss anemia likely due to upper GI bleed 7.  Delusions/hallucinations 8.  Fever/bacteremia 05/31/2020  [name] appears unchanged.  Still having low-grade fevers.  He remains on IV antibiotics.  I note that he had a repeat CT of the abdomen and pelvis performed on 6/19/thousand 21 which showed an increased size of hepatic metastasis since the prior CT and a new small liver lesion.  Additionally, his small right and trace left pleural effusions have slightly increased from the prior CT.  Disposition plan still pending.  Last note from [facility] team indicates that he will likely go to SNF.  We are awaiting additional testing including PD-L1, HER-2, and foundation 1.  Recommendations: 1.  Continue treatment of acute infection per hospitalist. 2.  Await PD-L1, HER-2, and foundation 1 testing. 3.  Await final disposition plan.    LOS: 90 days   Mikey Bussing, [place], AGPCNP-BC, AOCNP 06/05/20 Mr. Toops developed fever and bacteremia last week.  He appears less confused today, but continues to have a poor understanding of his  diagnosis and prognosis.  He continues to be febrile.  No clear source for infection has been identified, though I suspect he may have tumor related bacteremia.  The blood cultures could be contaminants and the fever related to "tumor fever ".  I think this is unlikely.  He has a poor prognosis for long-term survival.  I reviewed the CT images from yesterday.  The metastatic gastric cancer is progressing in the liver.  If the fever resolves and he returns to eating/ambulating we will consider  initiating FOLFOX.  I discussed the case with his niece, [name], by telephone today.  I explained the poor prognosis.  She indicates that she plans to visit him tomorrow.  I recommended the family obtain establish a power of attorney for Mr. Martindale.  He will be a candidate for hospice care and [facility] if his condition does not improve over the next few days.

## 2020-06-06 LAB — BASIC METABOLIC PANEL
Anion gap: 10 (ref 5–15)
BUN: 6 mg/dL — ABNORMAL LOW (ref 8–23)
CO2: 25 mmol/L (ref 22–32)
Calcium: 8.4 mg/dL — ABNORMAL LOW (ref 8.9–10.3)
Chloride: 108 mmol/L (ref 98–111)
Creatinine, Ser: 0.76 mg/dL (ref 0.61–1.24)
GFR calc Af Amer: >60 mL/min (ref >60)
GFR calc non Af Amer: >60 mL/min (ref >60)
Glucose, Bld: 92 mg/dL (ref 70–99)
Potassium: 3.8 mmol/L (ref 3.5–5.1)
Sodium: 143 mmol/L (ref 135–145)

## 2020-06-06 LAB — CBC
HCT: 26.1 % — ABNORMAL LOW (ref 39.0–52.0)
Hemoglobin: 8.2 g/dL — ABNORMAL LOW (ref 13.0–17.0)
MCH: 27.6 pg (ref 26.0–34.0)
MCHC: 31.4 g/dL (ref 30.0–36.0)
MCV: 87.9 fL (ref 80.0–100.0)
Platelets: 362 10*3/uL (ref 150–400)
RBC: 2.97 MIL/uL — ABNORMAL LOW (ref 4.22–5.81)
RDW: 17.4 % — ABNORMAL HIGH (ref 11.5–15.5)
WBC: 14.1 10*3/uL — ABNORMAL HIGH (ref 4.0–10.5)
nRBC: 0 % (ref 0.0–0.2)

## 2020-06-06 NOTE — Progress Notes (Signed)
PROGRESS NOTE    Kenneth Sullivan  JTT:[id number] DOB: 04-25-1956 DOA: 03/07/2020 PCP: Medicine, [name]   Brief Narrative: 64 year old with no prior medical history who had acute shortness of breath respiratory failure oxygen saturation in the 40s on EMS arrival.  He had a witnessed bradycardic, asystole arrest when EMS was present with 3 rounds of epi, 20 minutes of CPR prior to [place]. Underwent normothermia protocol.  Work-up was significant for massive PE, DVT with right ventricular strain.  Unfortunately hospital course was complicated by upper GI bleed due to gastric ulcer.  A CT abdomen pelvis showed a large antral mass and multiple liver lesions biopsy confirmed a high-grade neuroendocrine tumor, subsequent EGD with biopsy revealing adenocarcinoma.  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   For his massive PE patient underwent IR guided mechanical thrombectomy and IVC filter.  Post procedure, while on heparin drip patient is started having maroon-colored red stool and bloody drainage from OGT.  GI did endoscopy which showed oozing gastric ulcer with clots.  IR did GDA embolization.  Patient remained encephalopathic. On 3/27, MRI of the brain showed multifocal acute to subacute ischemic infarct involving bilateral cerebral hemisphere consistent with global hypoperfusion related to cardiac arrest, also associated with scatter petechial hemorrhage with evidence of hemorrhagic conversion in the parietal lobe. Abdomen and pelvis showed gastric lesion in the antrum concerning for antral mass and multiple liver lesion concerning for metastatic disease IR consulted and he underwent liver biopsy on 5/19.  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  Patient developed fever, leukocytosis on June 17.  Blood culture growing Streptococcus.  1 out of 2 growing staph coagulase negative.  We are considering blood  culture with staph is contaminant. Blood culture repeated on 6/21. Patient was started on Unasyn. CT abdomen pelvis negative for abscess, but showed worsening liver metastatic diseases. Dr Benay Spice will discussed with family in regards goals of care and consideration for hospice care.   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   Chest x ray repeated negative for PNA> requiring 2 L oxygen,.  2-Cardiac arrest: Secondary to massive PE On telemetry. Patient is not a long-term anticoagulation candidate given large gastric ulcer, secondary to neuroendocrine tumor given risk for massive GI bleed. Also history of a stroke with hemorrhagic conversion.  3-Massive PE with DVT status post IVC filter: Patient is not a long-term anticoagulation candidate given large gastric ulcer, secondary to neuroendocrine tumor given risk for massive GI bleed. Status post IVC filter placement on mechanical thrombectomy.  4-Anoxic encephalopathy: Stable. Less confuse today.   5-Upper GI bleed secondary to large malignant gastric ulcer with acute blood loss anemia Underwent endoscopy x2.  Subsequently embolization of GDA by IR Continue with PPI. Endoscopy showed adenocarcinoma. Patient report bloody stool last night. Hb stable monitor. Started  ferrous sulfate.   6-Gastric adenocarcinoma with neuroendocrine component that has  metastasized to the liver; -Patient was accepted at assisted living facility here in [place].  The facility is not able to provide transportation to the cancer center for chemotherapy.  Discussed with Dr. Learta Codding consult Center might be able to provide with transportation. -Cancer center will be able to provide transportation from ALF to cancer center for [place].  CT showed increase size of liver metastatic diseases.   7-Fever, Bacteremia; Streptococcus.  Patient develops fever, mild leukocytosis.  Blood culture growing streptococcus.  UA with more than 50 WBC. Insignificant growth.  Source could be GI from cancer. Discussed with ID will change IV antibiotics to Unasyn. CT abdomen pelvis showed enlargement of metastatic liver diseases and new liver mass. Moderate stool burden.  WBC trending down now.  Continue with Unasyn.  Fever curve decreasing.  Now 1 out of 2 blood culture growing staph coagulase negative. This might be a contaminate. Repeated blood culture 6/21 pending.   Tachycardia; related to fever. Hypovolemia. Hypotension Received fluids.  Vitals stable today.  On IV Unasyn  Follow WBC<   Dysphagia: Currently on regular diet  Delusion hallucination: Psych consulted.  At times he gets agitated.  He is  Improved. Continue with Seroquel Severe malnutrition: Etiology chronic illness gastric lesion, multiple evaluation.  Continue with Ensure  Constipation schedule BR Hypokalemia replete orally Nutrition Problem: Severe Malnutrition Etiology: chronic illness (gastric lesion, multiple liver lesions, concern for metastatic disease)    Signs/Symptoms: severe fat depletion, severe muscle depletion    Interventions: Ensure Enlive (each supplement provides 350kcal and 20 grams of protein), Magic cup  Estimated body mass index is 18.87 kg/m as calculated from the following:   Height as of this encounter: 6\' 2"  (1.88 m).   Weight as of this encounter: 66.7 kg.   DVT  prophylaxis: SCDs Code Status: DNR Family Communication: Discussed with patient Disposition Plan:  Status is: Inpatient  Remains inpatient appropriate because:Awaiting placement   Dispo: The patient is from: Home              Anticipated d/c is to: ALF              Anticipated d/c date is: 3 days              Patient currently is not medically stable to d/c. patient with fever from streptococcus bacteremia. Continue to have fever.         Consultants:  [facility] Oncology Palliative  Procedures:  Significant events: 3/23>> Admit, EGD with findings of oozing gastric ulcer with clot, IR for mechanical thrombectomy, IVC filter and GDA embolization 4/5>>Trach 4/28>>trach decannulated  Significant studies: CTA 3/23 >> bilateral PE with RV/with ratio 1.9, emphysema CT head 3/23 >>chronic microvascular changes. No acute findings Echo 3/23 >> RV is severely dilated with reduced function and D-shaped septum, LVEF 60-65%. Venogram 3/23>> occlusive DVT of the iliac vein extending to the lower IVC LE Venous Duplex 3/24 >>acute DVT in BLE up to the femoral vein  MRI Brain 3/28 >> multifocal acute to subacute ischemic infarcts involving bilateral cerebral hemispheres, findings consistent with global hypoperfusion event related to cardiac arrest, associated scattered petechial hemorrhage about many areas of ischemia, evidence of hemorrhagic conversion in the right parietal lobe   Antimicrobials:  Doxycycline from 4/9 on 12/17  Subjective: He doesn't have clear understanding of his condition. He denies abdominal pain.    Objective: Vitals:   06/05/20 2025 06/06/20 0512 06/06/20 0725 06/06/20 0831  BP: 95/66 120/75 106/69 106/69  Pulse: 100 100 (!)  112 (!) 112  Resp: 20 20 18 18   Temp: 97.9 F (36.6 C) 99.3 F (37.4 C) 100 F (37.8 C) 100 F (37.8 C)  TempSrc: Oral Axillary  Oral  SpO2: 99% 94% 100% 100%  Weight:      Height:        Intake/Output Summary (Last 24 hours)  at 06/06/2020 1318 Last data filed at 06/06/2020 1009 Gross per 24 hour  Intake 795 ml  Output --  Net 795 ml   Filed Weights   05/30/20 0500 05/31/20 0500 06/01/20 0254  Weight: 67.6 kg 63.5 kg 66.7 kg    Examination:  General exam: NAD Respiratory system: CTA Cardiovascular system: S 1, S 2 RRR Gastrointestinal system: BS present, soft, ntt Central nervous system: alert Extremities: Symmetric power Skin: no rashes.    Data Reviewed: I have personally reviewed following labs and imaging studies  CBC: Recent Labs  Lab 06/02/20 0337 06/03/20 1134 06/04/20 1058 06/05/20 0039 06/06/20 0347  WBC 18.2* 17.5* 13.2* 13.4* 14.1*  HGB 8.3* 9.5* 7.8* 7.6* 8.2*  HCT 26.0* 30.1* 24.6* 23.8* 26.1*  MCV 88.4 87.5 86.6 87.5 87.9  PLT 289 289 299 313 456   Basic Metabolic Panel: Recent Labs  Lab 06/02/20 0337 06/03/20 1134 06/04/20 1058 06/05/20 0039 06/06/20 0347  NA 138 139 140 141 143  K 3.8 3.8 3.4* 4.6 3.8  CL 105 104 106 108 108  CO2 24 26 24 24 25   GLUCOSE 101* 98 122* 98 92  BUN 10 11 8  7* 6*  CREATININE 0.97 0.87 0.98 0.86 0.76  CALCIUM 8.4* 8.7* 8.4* 8.2* 8.4*  MG  --   --   --  1.9  --    GFR: Estimated Creatinine Clearance: 90.3 mL/min (by C-G formula based on SCr of 0.76 mg/dL). Liver Function Tests: Recent Labs  Lab 06/05/20 0039  AST 18  ALT 10  ALKPHOS 86  BILITOT 0.8  PROT 5.3*  ALBUMIN 1.9*   No results for input(s): LIPASE, AMYLASE in the last 168 hours. Recent Labs  Lab 06/04/20 1058  AMMONIA 18   Coagulation Profile: No results for input(s): INR, PROTIME in the last 168 hours. Cardiac Enzymes: No results for input(s): CKTOTAL, CKMB, CKMBINDEX, TROPONINI in the last 168 hours. BNP (last 3 results) No results for input(s): PROBNP in the last 8760 hours. HbA1C: No results for input(s): HGBA1C in the last 72 hours. CBG: No results for input(s): GLUCAP in the last 168 hours. Lipid Profile: No results for input(s): CHOL, HDL,  LDLCALC, TRIG, CHOLHDL, LDLDIRECT in the last 72 hours. Thyroid Function Tests: No results for input(s): TSH, T4TOTAL, FREET4, T3FREE, THYROIDAB in the last 72 hours. Anemia Panel: No results for input(s): VITAMINB12, FOLATE, FERRITIN, TIBC, IRON, RETICCTPCT in the last 72 hours. Sepsis Labs: Recent Labs  Lab 06/01/20 1744  LATICACIDVEN 1.6    Recent Results (from the past 240 hour(s))  SARS CORONAVIRUS 2 (TAT 6-24 HRS) Nasopharyngeal Nasopharyngeal Swab     Status: None   Collection Time: 05/30/20 11:25 AM   Specimen: Nasopharyngeal Swab  Result Value Ref Range Status   SARS Coronavirus 2 NEGATIVE NEGATIVE Final    Comment: (NOTE) SARS-CoV-2 target nucleic acids are NOT DETECTED.  The SARS-CoV-2 RNA is generally detectable in upper and lower respiratory specimens during the acute phase of infection. Negative results do not preclude SARS-CoV-2 infection, do not rule out co-infections with other pathogens, and should not be used as the sole basis for treatment or other patient  management decisions. Negative results must be combined with clinical observations, patient history, and epidemiological information. The expected result is Negative.  Fact Sheet for Patients: SugarRoll.be  Fact Sheet for Healthcare Providers: https://www.woods-mathews.com/  This test is not yet approved or cleared by the [place] FDA and  has been authorized for detection and/or diagnosis of SARS-CoV-2 by FDA under an Emergency Use Authorization (EUA). This EUA will remain  in effect (meaning this test can be used) for the duration of the COVID-19 declaration under Se ction 564(b)(1) of the Act, 21 U.S.C. section 360bbb-3(b)(1), unless the authorization is terminated or revoked sooner.  Performed at [facility], [place] [address] [place], [place] [address]   Culture, blood (single)     Status: Abnormal   Collection Time: 06/01/20  5:37 AM    Specimen: BLOOD LEFT HAND  Result Value Ref Range Status   Specimen Description BLOOD LEFT HAND  Final   Special Requests   Final    BOTTLES DRAWN AEROBIC AND ANAEROBIC Blood Culture adequate volume   Culture  Setup Time   Final    IN BOTH AEROBIC AND ANAEROBIC BOTTLES GRAM POSITIVE COCCI IN CHAINS CRITICAL RESULT CALLED TO, READ BACK BY AND VERIFIED WITH: G ABBOTT PHARMD 06/02/20 0616 JDW    Culture (A)  Final    STREPTOCOCCUS CONSTELLATUS THE SIGNIFICANCE OF ISOLATING THIS ORGANISM FROM A SINGLE SET OF BLOOD CULTURES WHEN MULTIPLE SETS ARE DRAWN IS UNCERTAIN. PLEASE NOTIFY THE MICROBIOLOGY DEPARTMENT WITHIN ONE WEEK IF SPECIATION AND SENSITIVITIES ARE REQUIRED. Performed at [facility], [place] [address] [place], [place] [address]    Report Status 06/03/2020 FINAL  Final  Blood Culture ID Panel (Reflexed)     Status: Abnormal   Collection Time: 06/01/20  5:37 AM  Result Value Ref Range Status   Enterococcus species NOT DETECTED NOT DETECTED Final   Listeria monocytogenes NOT DETECTED NOT DETECTED Final   Staphylococcus species NOT DETECTED NOT DETECTED Final   Staphylococcus aureus (BCID) NOT DETECTED NOT DETECTED Final   Streptococcus species DETECTED (A) NOT DETECTED Final    Comment: Not Enterococcus species, Streptococcus agalactiae, Streptococcus pyogenes, or Streptococcus pneumoniae. CRITICAL RESULT CALLED TO, READ BACK BY AND VERIFIED WITH: G ABBOTT PHARMD 06/02/20 0616 JDW    Streptococcus agalactiae NOT DETECTED NOT DETECTED Final   Streptococcus pneumoniae NOT DETECTED NOT DETECTED Final   Streptococcus pyogenes NOT DETECTED NOT DETECTED Final   Acinetobacter baumannii NOT DETECTED NOT DETECTED Final   Enterobacteriaceae species NOT DETECTED NOT DETECTED Final   Enterobacter cloacae complex NOT DETECTED NOT DETECTED Final   Escherichia coli NOT DETECTED NOT DETECTED Final   Klebsiella oxytoca NOT DETECTED NOT DETECTED Final   Klebsiella pneumoniae NOT DETECTED NOT  DETECTED Final   Proteus species NOT DETECTED NOT DETECTED Final   Serratia marcescens NOT DETECTED NOT DETECTED Final   Haemophilus influenzae NOT DETECTED NOT DETECTED Final   Neisseria meningitidis NOT DETECTED NOT DETECTED Final   Pseudomonas aeruginosa NOT DETECTED NOT DETECTED Final   Candida albicans NOT DETECTED NOT DETECTED Final   Candida glabrata NOT DETECTED NOT DETECTED Final   Candida krusei NOT DETECTED NOT DETECTED Final   Candida parapsilosis NOT DETECTED NOT DETECTED Final   Candida tropicalis NOT DETECTED NOT DETECTED Final    Comment: Performed at [facility], [place] [address] [place], [place] [address]  Urine Culture     Status: Abnormal   Collection Time: 06/01/20  8:57 AM   Specimen: Urine,  Random  Result Value Ref Range Status   Specimen Description URINE, RANDOM  Final   Special Requests Normal  Final   Culture (A)  Final    <10,000 COLONIES/mL INSIGNIFICANT GROWTH Performed at [facility], [address] [place], [place] [address]    Report Status 06/02/2020 FINAL  Final  Culture, blood (Routine X 2) w Reflex to ID Panel     Status: None (Preliminary result)   Collection Time: 06/02/20 10:59 AM   Specimen: BLOOD  Result Value Ref Range Status   Specimen Description BLOOD LEFT ANTECUBITAL  Final   Special Requests   Final    BOTTLES DRAWN AEROBIC ONLY Blood Culture results may not be optimal due to an inadequate volume of blood received in culture bottles   Culture   Final    NO GROWTH 4 DAYS Performed at [facility], [place] [address] [place], [place] [address]    Report Status PENDING  Incomplete  Culture, blood (Routine X 2) w Reflex to ID Panel     Status: Abnormal (Preliminary result)   Collection Time: 06/02/20 10:59 AM   Specimen: BLOOD  Result Value Ref Range Status   Specimen Description BLOOD RIGHT ANTECUBITAL  Final   Special Requests   Final    BOTTLES DRAWN AEROBIC ONLY Blood Culture adequate volume   Culture  Setup  Time   Final    GRAM POSITIVE COCCI AEROBIC BOTTLE ONLY CRITICAL VALUE NOTED.  VALUE IS CONSISTENT WITH PREVIOUSLY REPORTED AND CALLED VALUE.    Culture (A)  Final    STAPHYLOCOCCUS EPIDERMIDIS SUSCEPTIBILITIES TO FOLLOW Performed at [facility], [place] [address], [place], [place] [address]    Report Status PENDING  Incomplete  Culture, blood (routine x 2)     Status: None (Preliminary result)   Collection Time: 06/05/20  7:40 PM   Specimen: BLOOD LEFT HAND  Result Value Ref Range Status   Specimen Description BLOOD LEFT HAND  Final   Special Requests   Final    BOTTLES DRAWN AEROBIC ONLY Blood Culture adequate volume   Culture   Final    NO GROWTH < 12 HOURS Performed at [facility], [place] [address] [place], [place] [address]    Report Status PENDING  Incomplete         Radiology Studies: No results found.      Scheduled Meds: . clonazePAM  0.5 mg Oral BID  . feeding supplement (ENSURE ENLIVE)  237 mL Oral QID  . ferrous sulfate  325 mg Oral Q breakfast  . FLUoxetine  10 mg Oral QHS  . OXcarbazepine  75 mg Oral BID  . pantoprazole  40 mg Oral BID  . polyethylene glycol  17 g Oral BID  . QUEtiapine  100 mg Oral QHS  . QUEtiapine  75 mg Oral Daily  . senna-docusate  1 tablet Oral BID   Continuous Infusions: . sodium chloride    . sodium chloride 50 mL/hr at 06/05/20 1606  . ampicillin-sulbactam (UNASYN) IV 3 g (06/06/20 0832)     LOS: 91 days    Time spent: 35 minutes.     Elmarie Shiley, MD [facility]   If 7PM-7AM, please contact night-coverage [email or web address]  06/06/2020, 1:18 PM

## 2020-06-06 NOTE — Progress Notes (Signed)
Physical Therapy Treatment Patient Details Name: Kenneth Sullivan MRN: [id number] DOB: September 25, 1956 Today's Date: 06/06/2020    History of Present Illness Pt is a 64 y.o. male admitted 03/07/20 with acute SOB; had bradycardic event with asystole/cardiac arrest, ROSC achieved in 20 minutes with 3 rounds epinephrine; intubated 3/23. Pt with massive PE/DVT and RV strain. S/p IR guided mechanical thrombectomy and IVC filter placement. Devoloped gastric ulcer s/p GDA embolization. MRI 3/27 revealed multifocal acute to subacute ischemic infarct involving bilateral cerebral hemisphere consistent with global hypoperfusion; associated scattered petechial hemorrhages with evidence of hemmorhagic conversion in R parietal lobe. S/p trach 4/5, changed to cuffless 4/22; decannulated 4/28. Liver biopsy 5/19. 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 On 06/01/20 - pt with new UTI.  PMH unknown.    PT Comments    Pt requiring mod A for transfers and gait with ability to only ambulate 12'.  He required assist to initiate and to continue all transfers, gait, and exercises.  Frequent multimodal cues required throughout session.  Pt with significant decline in function over the past several days, requiring increased assistance for transfers and balance.  Cont POC as able. Recommend SNF at this time for d/c. From therapy perspective, pt no longer safe for ALF as he is requiring increased assistance at this time.     Follow Up Recommendations  Supervision/Assistance - 24 hour;SNF     Equipment Recommendations  Rolling walker with 5" wheels    Recommendations for Other Services       Precautions / Restrictions Precautions Precautions: Fall;Other (comment) Precaution Comments: impulsive    Mobility  Bed Mobility Overal bed mobility: Needs Assistance Bed Mobility: Supine to Sit;Sit to Supine     Supine to sit: Mod assist Sit to supine: Mod  assist   General bed mobility comments: increased time and effort; max multimodal cues and assist to initiate  Transfers Overall transfer level: Needs assistance Equipment used: Rolling walker (2 wheeled) Transfers: Sit to/from Stand Sit to Stand: Mod assist         General transfer comment: mod A with multimodal cues, initiation, and bed elevated; performed x 2  Ambulation/Gait Ambulation/Gait assistance: Mod assist Gait Distance (Feet): 12 Feet Assistive device: Rolling walker (2 wheeled);1 person hand held assist Gait Pattern/deviations: Step-through pattern;Decreased stride length;Shuffle;Wide base of support;Trunk flexed Gait velocity: decr   General Gait Details: Required assist with RW and eventually switched to HHA due to not using RW, fatigued easily, required assist to maintain balance and for forward momentum; max multimodal cues   Stairs             Wheelchair Mobility    Modified Rankin (Stroke Patients Only) Modified Rankin (Stroke Patients Only) Pre-Morbid Rankin Score: No symptoms Modified Rankin: Moderately severe disability     Balance Overall balance assessment: Needs assistance Sitting-balance support: No upper extremity supported Sitting balance-Leahy Scale: Good     Standing balance support: During functional activity;No upper extremity supported Standing balance-Leahy Scale: Poor                              Cognition Arousal/Alertness: Awake/alert Behavior During Therapy: Flat affect Overall Cognitive Status: Impaired/Different from baseline Area of Impairment: Attention;Following commands;Safety/judgement;Awareness;Problem solving;Memory                 Orientation Level: Disoriented to;Time;Situation;Place Current Attention Level: Sustained Memory: Decreased short-term memory;Decreased recall of precautions Following  Commands: Follows one step commands with increased time Safety/Judgement: Decreased awareness  of deficits;Decreased awareness of safety Awareness: Intellectual Problem Solving: Requires tactile cues;Requires verbal cues;Slow processing;Decreased initiation;Difficulty sequencing General Comments: Pt with flat affect and emotional at times.  He was oriented to self only and required encouragment and multimodal cues for therapy.  Pt at times was able to carry on appropriate conversation but then had times of tangential thoughts and non-sensical conversation      Exercises General Exercises - Lower Extremity Ankle Circles/Pumps: AROM;Both;10 reps;Seated Long Arc Quad: Both;10 reps;Seated;AAROM Hip Flexion/Marching: AAROM;Both;10 reps;Seated    General Comments General comments (skin integrity, edema, etc.): VSS      Pertinent Vitals/Pain Pain Assessment: Faces Faces Pain Scale: Hurts a little bit Pain Location: stomach Pain Descriptors / Indicators: Sore;Burning;Grimacing Pain Intervention(s): Limited activity within patient's tolerance;Monitored during session    Home Living                      Prior Function            PT Goals (current goals can now be found in the care plan section) Progress towards PT goals: Not progressing toward goals - comment (decline in medical status (pt with fever))    Frequency    Min 2X/week      PT Plan Current plan remains appropriate    Co-evaluation              AM-PAC PT "6 Clicks" Mobility   Outcome Measure  Help needed turning from your back to your side while in a flat bed without using bedrails?: A Little Help needed moving from lying on your back to sitting on the side of a flat bed without using bedrails?: A Lot Help needed moving to and from a bed to a chair (including a wheelchair)?: A Lot Help needed standing up from a chair using your arms (e.g., wheelchair or bedside chair)?: A Lot Help needed to walk in hospital room?: A Lot Help needed climbing 3-5 steps with a railing? : Total 6 Click Score:  12    End of Session Equipment Utilized During Treatment: Gait belt Activity Tolerance: Patient limited by fatigue Patient left: with call bell/phone within reach;in bed;with bed alarm set Nurse Communication: Mobility status PT Visit Diagnosis: Unsteadiness on feet (R26.81);Other abnormalities of gait and mobility (R26.89)     Time: 6213-0865 PT Time Calculation (min) (ACUTE ONLY): 32 min  Charges:  $Gait Training: 8-22 mins $Therapeutic Exercise: 8-22 mins                     Abran Richard, PT Acute Rehab Services Pager [phone] [facility] 06/06/2020, 1:13 PM

## 2020-06-07 DIAGNOSIS — E43 Unspecified severe protein-calorie malnutrition: Secondary | ICD-10-CM

## 2020-06-07 DIAGNOSIS — A409 Streptococcal sepsis, unspecified: Secondary | ICD-10-CM

## 2020-06-07 LAB — CULTURE, BLOOD (ROUTINE X 2)
Culture: NO GROWTH
Special Requests: ADEQUATE

## 2020-06-07 NOTE — Progress Notes (Signed)
PROGRESS NOTE  Kenneth Sullivan GLO:[id number] DOB: [birth year]   PCP: [facility], [name]  Patient is from: home  DOA: 03/07/2020 LOS: 67  Brief Narrative / Interim history: 64 year old with no prior medical history who had acute shortness of breath respiratory failure oxygen saturation in the 40s on EMS arrival.  He had a witnessed bradycardic, asystole arrest when EMS was present with 3 rounds of epi, 20 minutes of CPR prior to [place]. Underwent normothermia protocol.  Work-up was significant for massive PE, DVT with right ventricular strain.  Unfortunately hospital course was complicated by upper GI bleed due to gastric ulcer.  A CT abdomen pelvis showed a large antral mass and multiple liver lesions biopsy confirmed a high-grade neuroendocrine tumor, subsequent EGD with biopsy revealing adenocarcinoma.  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   For his massive PE patient underwent IR guided mechanical thrombectomy and IVC filter.  Post procedure, while on heparin drip patient is started having maroon-colored red stool and bloody drainage from OGT.  GI did endoscopy which showed oozing gastric ulcer with clots.  IR did GDA embolization.  Patient remained encephalopathic. On 3/27, MRI of the brain showed multifocal acute to subacute ischemic infarct involving bilateral cerebral hemisphere consistent with global hypoperfusion related to cardiac arrest, also associated with scatter petechial hemorrhage with evidence of hemorrhagic conversion in the parietal lobe. CT abdomen and pelvis showed gastric lesion in the antrum concerning for antral mass and multiple liver lesion concerning for metastatic disease IR consulted and he underwent liver biopsy on 5/19.  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  Patient developed fever, leukocytosis on June 17.  Blood culture on 6/17 with Streptococcus Constellatus  (but one sample).  Repeat blood culture on 6/18 with staph epidermis in 1 out of 2 bottles 1 out of 2 growing staph coagulase negative likely contaminant.    Blood culture on 6/21 NGTD. Patient was started on Unasyn.  CT abdomen pelvis negative for abscess, but showed worsening liver metastatic diseases.  Oncology, Dr Benay Spice discussed case with patient's niece, [name] over the phone, and recommended the family to establish HCPOA and hospice unless his fever resolves and he returns today eating/ambulating to be considered for FOLFOX.    Subjective: Seen and examined earlier this morning.  No major events overnight of this morning.  Patient is oriented to self, place and month but not a great historian.  He has no complaints.  Objective: Vitals:   06/06/20 0831 06/06/20 1555 06/06/20 2051 06/07/20 0812  BP: 106/69 117/78 116/76 96/76  Pulse: (!) 112 (!) 106 (!) 107 (!) 107  Resp: 18 18  16   Temp: 100 F (37.8 C) 100 F (37.8 C) 99 F (37.2 C) 100 F (37.8 C)  TempSrc: Oral  Oral   SpO2: 100% 93% 95% 96%  Weight:      Height:        Intake/Output Summary (Last 24 hours) at 06/07/2020 1416 Last data filed at 06/06/2020 1514 Gross per 24 hour  Intake 561.67 ml  Output --  Net 561.67 ml   Filed Weights   05/30/20 0500 05/31/20 0500 06/01/20 0254  Weight: 67.6 kg 63.5 kg 66.7 kg    Examination:  GENERAL: No apparent distress.  Nontoxic. HEENT: MMM.  Vision and hearing grossly intact.  NECK: Supple.  No apparent JVD.  RESP:  No IWOB.  Fair aeration bilaterally. CVS:  RRR. Heart sounds normal.  ABD/GI/GU: BS+.  Abd soft, NTND.  MSK/EXT:  Moves extremities. No apparent deformity. No edema.  SKIN: no apparent skin lesion or wound NEURO: Awake, alert and oriented self, place and month but not situation.  No apparent focal neuro deficit. PSYCH: Calm.  Confused? Significant events: 3/23>> Admit, EGD with findings of oozing gastric ulcer with clot, IR for mechanical  thrombectomy, IVC filter and GDA embolization 4/5>>Trach 4/28>>trach decannulated  Significant studies: CTA 3/23 >> bilateral PE with RV/with ratio 1.9, emphysema CT head 3/23 >>chronic microvascular changes. No acute findings Echo 3/23 >> RV is severely dilated with reduced function and D-shaped septum, LVEF 60-65%. Venogram 3/23>> occlusive DVT of the iliac vein extending to the lower IVC LE Venous Duplex 3/24 >>acute DVT in BLE up to the femoral vein  MRI Brain 3/28 >> multifocal acute to subacute ischemic infarcts involving bilateral cerebral hemispheres, findings consistent with global hypoperfusion event related to cardiac arrest, associated scattered petechial hemorrhage about many areas of ischemia, evidence of hemorrhagic conversion in the right parietal lobe  CT abdomen and pelvis on 6/19 with increased size of hepatic metastasis with new small liver lesions  Assessment & Plan: Acute hypoxic respiratory failure in the setting of massive PE/cardiac arrest: Resolved Managed in the ICU with status post tracheostomy subsequently decannulated.    Cardiac arrest with anoxic brain injury due to massive PE Massive PE with DVT status post IVC filter: -Status post IVC filter placement on mechanical thrombectomy. 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 -Repeat blood culture on 6/21 NGTD.  Normocytic anemia: H&H stable. -Continue monitoring  Dysphagia:  Resolved.  On regular diet.  Delusion hallucination: Psych consulted.  At times he gets agitated.  He is  Improved. -Continue with Seroquel   Constipation  -schedule BR  Hypokalemia -Replenish and recheck  Leukocytosis-likely due to bacteremia.  Improving -Check CBC with differential in the morning  Debility/physical deconditioning -Continue PT/OT  Goal of care/DNR/DNI-patient is gradually deconditioning.  CT also showing increased liver mets with new lesions.  Oncology suggesting hospice given his current situation. -Needs further goal of care discussion  Severe malnutrition: Etiology chronic illness gastric lesion, multiple evaluation.  Continue with Ensure Body mass index is 18.87 kg/m. Nutrition Problem: Severe Malnutrition Etiology: chronic illness (gastric lesion, multiple liver lesions, concern for metastatic disease) Signs/Symptoms: severe fat depletion, severe muscle depletion Interventions: Ensure Enlive (each supplement provides 350kcal and 20 grams of protein), Magic cup   DVT prophylaxis:  SCDs Start: 03/07/20 0835  Code Status: DNR/DNI Family Communication: Patient and/or RN. Available if any question. Status is: Inpatient  Remains inpatient appropriate because:Hemodynamically unstable, Unsafe d/c plan, IV treatments appropriate due to intensity of illness or inability to take PO and Inpatient level of care appropriate due to severity of illness   Dispo: The patient is from: Home              Anticipated d/c is to: To be determined  Anticipated d/c date is: 3 days              Patient currently is not medically stable to d/c.       Consultants:  Oncology Palliative medicine   Sch Meds:  Scheduled Meds: . clonazePAM  0.5 mg Oral BID  . feeding supplement (ENSURE ENLIVE)  237 mL Oral QID  . ferrous sulfate  325 mg Oral Q  breakfast  . FLUoxetine  10 mg Oral QHS  . OXcarbazepine  75 mg Oral BID  . pantoprazole  40 mg Oral BID  . polyethylene glycol  17 g Oral BID  . QUEtiapine  100 mg Oral QHS  . QUEtiapine  75 mg Oral Daily  . senna-docusate  1 tablet Oral BID   Continuous Infusions: . sodium chloride    . sodium chloride 50 mL/hr at 06/05/20 1606  . ampicillin-sulbactam (UNASYN) IV 3 g (06/07/20 0843)   PRN Meds:.sodium chloride, acetaminophen, docusate sodium, guaiFENesin-dextromethorphan, hydrALAZINE, ipratropium-albuterol, LORazepam, oxyCODONE, sodium chloride flush  Antimicrobials: Anti-infectives (From admission, onward)   Start     Dose/Rate Route Frequency Ordered Stop   06/02/20 1630  Ampicillin-Sulbactam (UNASYN) 3 g in sodium chloride 0.9 % 100 mL IVPB     Discontinue     3 g 200 mL/hr over 30 Minutes Intravenous Every 6 hours 06/02/20 1626     06/02/20 0645  cefTRIAXone (ROCEPHIN) 2 g in sodium chloride 0.9 % 100 mL IVPB  Status:  Discontinued        2 g 200 mL/hr over 30 Minutes Intravenous Daily 06/02/20 0622 06/02/20 1607   06/01/20 0715  cefTRIAXone (ROCEPHIN) 1 g in sodium chloride 0.9 % 100 mL IVPB  Status:  Discontinued        1 g 200 mL/hr over 30 Minutes Intravenous Every 24 hours 06/01/20 0710 06/02/20 0622   03/26/20 1100  doxycycline (VIBRAMYCIN) 100 mg in sodium chloride 0.9 % 250 mL IVPB  Status:  Discontinued        100 mg 125 mL/hr over 120 Minutes Intravenous 2 times daily 03/26/20 1002 04/01/20 1035   03/25/20 1100  doxycycline (VIBRA-TABS) tablet 100 mg  Status:  Discontinued        100 mg Per Tube Every 12 hours 03/25/20 1009 03/26/20 1002   03/25/20 1015  doxycycline (VIBRAMYCIN) 100 mg in sodium chloride 0.9 % 250 mL IVPB  Status:  Discontinued       Note to Pharmacy: To be given after the tracheal aspirate is collected.   100 mg 125 mL/hr over 120 Minutes Intravenous Every 12 hours 03/25/20 1005 03/25/20 1008       I have personally reviewed the following  labs and images: CBC: Recent Labs  Lab 06/02/20 0337 06/03/20 1134 06/04/20 1058 06/05/20 0039 06/06/20 0347  WBC 18.2* 17.5* 13.2* 13.4* 14.1*  HGB 8.3* 9.5* 7.8* 7.6* 8.2*  HCT 26.0* 30.1* 24.6* 23.8* 26.1*  MCV 88.4 87.5 86.6 87.5 87.9  PLT 289 289 299 313 362   BMP &GFR Recent Labs  Lab 06/02/20 0337 06/03/20 1134 06/04/20 1058 06/05/20 0039 06/06/20 0347  NA 138 139 140 141 143  K 3.8 3.8 3.4* 4.6 3.8  CL 105 104 106 108 108  CO2 24 26 24 24 25   GLUCOSE 101* 98 122* 98 92  BUN 10 11 8  7* 6*  CREATININE 0.97 0.87 0.98 0.86 0.76  CALCIUM 8.4* 8.7* 8.4* 8.2* 8.4*  MG  --   --   --  1.9  --  Estimated Creatinine Clearance: 90.3 mL/min (by C-G formula based on SCr of 0.76 mg/dL). Liver & Pancreas: Recent Labs  Lab 06/05/20 0039  AST 18  ALT 10  ALKPHOS 86  BILITOT 0.8  PROT 5.3*  ALBUMIN 1.9*   No results for input(s): LIPASE, AMYLASE in the last 168 hours. Recent Labs  Lab 06/04/20 1058  AMMONIA 18   Diabetic: No results for input(s): HGBA1C in the last 72 hours. No results for input(s): GLUCAP in the last 168 hours. Cardiac Enzymes: No results for input(s): CKTOTAL, CKMB, CKMBINDEX, TROPONINI in the last 168 hours. No results for input(s): PROBNP in the last 8760 hours. Coagulation Profile: No results for input(s): INR, PROTIME in the last 168 hours. Thyroid Function Tests: No results for input(s): TSH, T4TOTAL, FREET4, T3FREE, THYROIDAB in the last 72 hours. Lipid Profile: No results for input(s): CHOL, HDL, LDLCALC, TRIG, CHOLHDL, LDLDIRECT in the last 72 hours. Anemia Panel: No results for input(s): VITAMINB12, FOLATE, FERRITIN, TIBC, IRON, RETICCTPCT in the last 72 hours. Urine analysis:    Component Value Date/Time   COLORURINE AMBER (A) 06/01/2020 0506   APPEARANCEUR HAZY (A) 06/01/2020 0506   LABSPEC 1.025 06/01/2020 0506   PHURINE 5.0 06/01/2020 0506   GLUCOSEU NEGATIVE 06/01/2020 0506   HGBUR NEGATIVE 06/01/2020 0506   BILIRUBINUR  NEGATIVE 06/01/2020 0506   KETONESUR NEGATIVE 06/01/2020 0506   PROTEINUR NEGATIVE 06/01/2020 0506   NITRITE NEGATIVE 06/01/2020 0506   LEUKOCYTESUR LARGE (A) 06/01/2020 0506   Sepsis Labs: Invalid input(s): PROCALCITONIN, [place]  Microbiology: Recent Results (from the past 240 hour(s))  SARS CORONAVIRUS 2 (TAT 6-24 HRS) Nasopharyngeal Nasopharyngeal Swab     Status: None   Collection Time: 05/30/20 11:25 AM   Specimen: Nasopharyngeal Swab  Result Value Ref Range Status   SARS Coronavirus 2 NEGATIVE NEGATIVE Final    Comment: (NOTE) SARS-CoV-2 target nucleic acids are NOT DETECTED.  The SARS-CoV-2 RNA is generally detectable in upper and lower respiratory specimens during the acute phase of infection. Negative results do not preclude SARS-CoV-2 infection, do not rule out co-infections with other pathogens, and should not be used as the sole basis for treatment or other patient management decisions. Negative results must be combined with clinical observations, patient history, and epidemiological information. The expected result is Negative.  Fact Sheet for Patients: SugarRoll.be  Fact Sheet for Healthcare Providers: https://www.woods-mathews.com/  This test is not yet approved or cleared by the [place] FDA and  has been authorized for detection and/or diagnosis of SARS-CoV-2 by FDA under an Emergency Use Authorization (EUA). This EUA will remain  in effect (meaning this test can be used) for the duration of the COVID-19 declaration under Se ction 564(b)(1) of the Act, 21 U.S.C. section 360bbb-3(b)(1), unless the authorization is terminated or revoked sooner.  Performed at [facility], [place] [address], [place], [place] [address]   Culture, blood (single)     Status: Abnormal   Collection Time: 06/01/20  5:37 AM   Specimen: BLOOD LEFT HAND  Result Value Ref Range Status   Specimen Description BLOOD LEFT HAND   Final   Special Requests   Final    BOTTLES DRAWN AEROBIC AND ANAEROBIC Blood Culture adequate volume   Culture  Setup Time   Final    IN BOTH AEROBIC AND ANAEROBIC BOTTLES GRAM POSITIVE COCCI IN CHAINS CRITICAL RESULT CALLED TO, READ BACK BY AND VERIFIED WITH: G ABBOTT PHARMD 06/02/20 0616 JDW    Culture (A)  Final  STREPTOCOCCUS CONSTELLATUS THE SIGNIFICANCE OF ISOLATING THIS ORGANISM FROM A SINGLE SET OF BLOOD CULTURES WHEN MULTIPLE SETS ARE DRAWN IS UNCERTAIN. PLEASE NOTIFY THE MICROBIOLOGY DEPARTMENT WITHIN ONE WEEK IF SPECIATION AND SENSITIVITIES ARE REQUIRED. Performed at [facility], [place] [address] [place], [place] [address]    Report Status 06/03/2020 FINAL  Final  Blood Culture ID Panel (Reflexed)     Status: Abnormal   Collection Time: 06/01/20  5:37 AM  Result Value Ref Range Status   Enterococcus species NOT DETECTED NOT DETECTED Final   Listeria monocytogenes NOT DETECTED NOT DETECTED Final   Staphylococcus species NOT DETECTED NOT DETECTED Final   Staphylococcus aureus (BCID) NOT DETECTED NOT DETECTED Final   Streptococcus species DETECTED (A) NOT DETECTED Final    Comment: Not Enterococcus species, Streptococcus agalactiae, Streptococcus pyogenes, or Streptococcus pneumoniae. CRITICAL RESULT CALLED TO, READ BACK BY AND VERIFIED WITH: G ABBOTT PHARMD 06/02/20 0616 JDW    Streptococcus agalactiae NOT DETECTED NOT DETECTED Final   Streptococcus pneumoniae NOT DETECTED NOT DETECTED Final   Streptococcus pyogenes NOT DETECTED NOT DETECTED Final   Acinetobacter baumannii NOT DETECTED NOT DETECTED Final   Enterobacteriaceae species NOT DETECTED NOT DETECTED Final   Enterobacter cloacae complex NOT DETECTED NOT DETECTED Final   Escherichia coli NOT DETECTED NOT DETECTED Final   Klebsiella oxytoca NOT DETECTED NOT DETECTED Final   Klebsiella pneumoniae NOT DETECTED NOT DETECTED Final   Proteus species NOT DETECTED NOT DETECTED Final   Serratia marcescens NOT DETECTED  NOT DETECTED Final   Haemophilus influenzae NOT DETECTED NOT DETECTED Final   Neisseria meningitidis NOT DETECTED NOT DETECTED Final   Pseudomonas aeruginosa NOT DETECTED NOT DETECTED Final   Candida albicans NOT DETECTED NOT DETECTED Final   Candida glabrata NOT DETECTED NOT DETECTED Final   Candida krusei NOT DETECTED NOT DETECTED Final   Candida parapsilosis NOT DETECTED NOT DETECTED Final   Candida tropicalis NOT DETECTED NOT DETECTED Final    Comment: Performed at [facility], [place] [address], [place], [place] [address]  Urine Culture     Status: Abnormal   Collection Time: 06/01/20  8:57 AM   Specimen: Urine, Random  Result Value Ref Range Status   Specimen Description URINE, RANDOM  Final   Special Requests Normal  Final   Culture (A)  Final    <10,000 COLONIES/mL INSIGNIFICANT GROWTH Performed at [facility], [place] [address], [place], [place] [address]    Report Status 06/02/2020 FINAL  Final  Culture, blood (Routine X 2) w Reflex to ID Panel     Status: None   Collection Time: 06/02/20 10:59 AM   Specimen: BLOOD  Result Value Ref Range Status   Specimen Description BLOOD LEFT ANTECUBITAL  Final   Special Requests   Final    BOTTLES DRAWN AEROBIC ONLY Blood Culture results may not be optimal due to an inadequate volume of blood received in culture bottles   Culture   Final    NO GROWTH 5 DAYS Performed at [facility], [place] [address], [place], [place] [address]    Report Status 06/07/2020 FINAL  Final  Culture, blood (Routine X 2) w Reflex to ID Panel     Status: Abnormal (Preliminary result)   Collection Time: 06/02/20 10:59 AM   Specimen: BLOOD  Result Value Ref Range Status   Specimen Description BLOOD RIGHT ANTECUBITAL  Final   Special Requests   Final    BOTTLES DRAWN AEROBIC ONLY Blood Culture  adequate volume   Culture  Setup Time   Final    GRAM POSITIVE COCCI AEROBIC BOTTLE ONLY CRITICAL VALUE NOTED.  VALUE IS CONSISTENT WITH PREVIOUSLY  REPORTED AND CALLED VALUE.    Culture (A)  Final    STAPHYLOCOCCUS EPIDERMIDIS SUSCEPTIBILITIES TO FOLLOW Performed at [facility], [place] [address] [place], [place] [address]    Report Status PENDING  Incomplete  Culture, blood (routine x 2)     Status: None (Preliminary result)   Collection Time: 06/05/20  7:40 PM   Specimen: BLOOD LEFT HAND  Result Value Ref Range Status   Specimen Description BLOOD LEFT HAND  Final   Special Requests   Final    BOTTLES DRAWN AEROBIC ONLY Blood Culture adequate volume   Culture   Final    NO GROWTH 2 DAYS Performed at [facility], [place] [address] [place], [place] [address]    Report Status PENDING  Incomplete    Radiology Studies: No results found.    Keona Bilyeu T. [place]  If 7PM-7AM, please contact night-coverage [email or web address] Password [facility] 06/07/2020, 2:16 PM

## 2020-06-07 NOTE — TOC Progression Note (Addendum)
Transition of Care [facility]) - Progression Note    Patient Details  Name: Kenneth Sullivan MRN: [id number] Date of Birth: 18-Feb-1956  Transition of Care [facility] CM/SW [place], RN Phone Number: [phone]  06/07/2020, 2:06 PM  Clinical Narrative:    CM continues to follow patient for disposition needs. Currently not appropriate for discharge due to fever/ currently on IV antibiotics. MD to determine goals of care.    Expected Discharge Plan: Skilled Nursing Facility Barriers to Discharge: No SNF bed  Expected Discharge Plan and Services Expected Discharge Plan: [place] In-house Referral: Clinical Social Work     Living arrangements for the past 2 months: Apartment                                       Social Determinants of Health (SDOH) Interventions    Readmission Risk Interventions No flowsheet data found.

## 2020-06-08 LAB — CBC WITH DIFFERENTIAL/PLATELET
Abs Immature Granulocytes: 0.14 10*3/uL — ABNORMAL HIGH (ref 0.00–0.07)
Basophils Absolute: 0 10*3/uL (ref 0.0–0.1)
Basophils Relative: 0 %
Eosinophils Absolute: 0.2 10*3/uL (ref 0.0–0.5)
Eosinophils Relative: 1 %
HCT: 23.1 % — ABNORMAL LOW (ref 39.0–52.0)
Hemoglobin: 7.2 g/dL — ABNORMAL LOW (ref 13.0–17.0)
Immature Granulocytes: 1 %
Lymphocytes Relative: 11 %
Lymphs Abs: 1.6 10*3/uL (ref 0.7–4.0)
MCH: 27.2 pg (ref 26.0–34.0)
MCHC: 31.2 g/dL (ref 30.0–36.0)
MCV: 87.2 fL (ref 80.0–100.0)
Monocytes Absolute: 1 10*3/uL (ref 0.1–1.0)
Monocytes Relative: 6 %
Neutro Abs: 12.3 10*3/uL — ABNORMAL HIGH (ref 1.7–7.7)
Neutrophils Relative %: 81 %
Platelets: 446 10*3/uL — ABNORMAL HIGH (ref 150–400)
RBC: 2.65 MIL/uL — ABNORMAL LOW (ref 4.22–5.81)
RDW: 17.5 % — ABNORMAL HIGH (ref 11.5–15.5)
WBC: 15.3 10*3/uL — ABNORMAL HIGH (ref 4.0–10.5)
nRBC: 0 % (ref 0.0–0.2)

## 2020-06-08 NOTE — Progress Notes (Signed)
PROGRESS NOTE  Kenneth Sullivan OZD:[id number] DOB: March 26, 1956   PCP: Medicine, [name]  Patient is from: home  DOA: 03/07/2020 LOS: 17  Brief Narrative / Interim history: 64 year old with no prior medical history who had acute shortness of breath respiratory failure oxygen saturation in the 40s on EMS arrival.  He had a witnessed bradycardic, asystole arrest when EMS was present with 3 rounds of epi, 20 minutes of CPR prior to [place]. Underwent normothermia protocol.  Work-up was significant for massive PE, DVT with right ventricular strain.  Unfortunately hospital course was complicated by upper GI bleed due to gastric ulcer.  A CT abdomen pelvis showed a large antral mass and multiple liver lesions biopsy confirmed a high-grade neuroendocrine tumor, subsequent EGD with biopsy revealing adenocarcinoma.  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   For his massive PE patient underwent IR guided mechanical thrombectomy and IVC filter.  Post procedure, while on heparin drip patient is started having maroon-colored red stool and bloody drainage from OGT.  GI did endoscopy which showed oozing gastric ulcer with clots.  IR did GDA embolization.  Patient remained encephalopathic. On 3/27, MRI of the brain showed multifocal acute to subacute ischemic infarct involving bilateral cerebral hemisphere consistent with global hypoperfusion related to cardiac arrest, also associated with scatter petechial hemorrhage with evidence of hemorrhagic conversion in the parietal lobe. CT abdomen and pelvis showed gastric lesion in the antrum concerning for antral mass and multiple liver lesion concerning for metastatic disease IR consulted and he underwent liver biopsy on 5/19.  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  Patient developed fever, leukocytosis on June 17.  Blood culture on 6/17 with Streptococcus Constellatus  (but one sample).  Repeat blood culture on 6/18 with staph epidermis in 1 out of 2 bottles 1 out of 2 growing staph coagulase negative likely contaminant.    Blood culture on 6/21 NGTD. Patient was started on Unasyn.  CT abdomen pelvis negative for abscess, but showed worsening liver metastatic diseases.  Oncology, Dr Benay Spice discussed case with patient's niece, Kenneth Sullivan over the phone, and recommended the family to establish HCPOA and hospice unless his fever resolves and he returns today eating/ambulating to be considered for FOLFOX.  This is also complicated by complex social issues   Subjective: Seen and examined earlier this morning.  No major events overnight of this morning.  He is awake and alert but confused.  Does not appear to be in distress.  Objective: Vitals:   06/06/20 2051 06/07/20 0812 06/07/20 2014 06/08/20 0820  BP: 116/76 96/76 94/69  110/79  Pulse: (!) 107 (!) 107 99 98  Resp:  16  18  Temp: 99 F (37.2 C) 100 F (37.8 C) 98.2 F (36.8 C) 98.5 F (36.9 C)  TempSrc: Oral     SpO2: 95% 96% 97% 100%  Weight:      Height:        Intake/Output Summary (Last 24 hours) at 06/08/2020 1527 Last data filed at 06/07/2020 1700 Gross per 24 hour  Intake 100 ml  Output --  Net 100 ml   Filed Weights   05/30/20 0500 05/31/20 0500 06/01/20 0254  Weight: 67.6 kg 63.5 kg 66.7 kg    Examination:  GENERAL: No apparent distress.  Nontoxic. HEENT: MMM.  Vision and hearing grossly intact.  NECK: Supple.  No apparent JVD.  RESP:  No IWOB.  Fair aeration bilaterally. CVS:  RRR. Heart sounds normal.  ABD/GI/GU: BS+. Abd soft, NTND.  MSK/EXT:  Moves extremities. No apparent deformity. No edema.  SKIN: no apparent skin lesion or wound NEURO: Awake, alert and oriented to self and place but not to situation.  No apparent focal neuro deficit. PSYCH: Calm.  No distress or agitation.  Significant events: 3/23>> Admit, EGD with findings of oozing gastric ulcer with clot, IR  for mechanical thrombectomy, IVC filter and GDA embolization 4/5>>Trach 4/28>>trach decannulated  Significant studies: CTA 3/23 >> bilateral PE with RV/with ratio 1.9, emphysema CT head 3/23 >>chronic microvascular changes. No acute findings Echo 3/23 >> RV is severely dilated with reduced function and D-shaped septum, LVEF 60-65%. Venogram 3/23>> occlusive DVT of the iliac vein extending to the lower IVC LE Venous Duplex 3/24 >>acute DVT in BLE up to the femoral vein  MRI Brain 3/28 >> multifocal acute to subacute ischemic infarcts involving bilateral cerebral hemispheres, findings consistent with global hypoperfusion event related to cardiac arrest, associated scattered petechial hemorrhage about many areas of ischemia, evidence of hemorrhagic conversion in the right parietal lobe  CT abdomen and pelvis on 6/19 with increased size of hepatic metastasis with new small liver lesions  Assessment & Plan: Acute hypoxic respiratory failure in the setting of massive PE/cardiac arrest: Resolved Managed in the ICU with status post tracheostomy subsequently decannulated.    Cardiac arrest with anoxic brain injury due to massive PE Massive PE with DVT status post IVC filter: -Status post IVC filter placement on mechanical thrombectomy. 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  His sepsis  physiology could also be due to malignancy. -On IV Unasyn per discussion between previous attending and ID. -Repeat blood culture on 6/21 NGTD.  Normocytic anemia: H&H stable. -Continue monitoring  Dysphagia:  Resolved.  On regular diet.  Delusion hallucination: Psych consulted.  At times he gets agitated.  He is  Improved. -Continue with Seroquel   Constipation  -schedule BR  Hypokalemia -Replenish and recheck  Leukocytosis-likely due to bacteremia.  Improving -Check CBC with differential in the morning  Debility/physical deconditioning -Continue PT/OT  Goal of care/DNR/DNI-patient is gradually deconditioning.  CT also showing increased liver mets with new lesions.  Oncology suggesting hospice given his current situation. -Needs further goal of care discussion-we will reconsult palliative care  Ethical issue-patient with new diagnosis of adenocarcinoma with metastasis to liver.  There is a question of treatment with chemo but multiple complicating factors medically and socially. 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 Although there is some improvement in his mental status, he is still confused with very poor insight into his situation.  No close family member other niece, no place to stay, no reliable transportation... -Clinical cytogeneticist for insight going forward  Severe malnutrition: Etiology chronic illness gastric lesion, multiple evaluation.  Continue with Ensure Body mass index is 18.87 kg/m. Nutrition Problem: Severe Malnutrition Etiology: chronic illness (gastric lesion, multiple liver lesions, concern for metastatic disease) Signs/Symptoms: severe fat depletion, severe muscle  depletion Interventions: Ensure Enlive (each supplement provides 350kcal and 20 grams of protein), Magic cup   DVT prophylaxis:  SCDs Start: 03/07/20 0835  Code Status: DNR/DNI Family Communication: Patient and/or RN. Available if any question. Status is: Inpatient  Remains  inpatient appropriate because:Hemodynamically unstable, Unsafe d/c plan, IV treatments appropriate due to intensity of illness or inability to take PO and Inpatient level of care appropriate due to severity of illness   Dispo: The patient is from: Home              Anticipated d/c is to: To be determined              Anticipated d/c date is: > 3 days              Patient currently is not medically stable to d/c.       Consultants:  Oncology Palliative medicine   Sch Meds:  Scheduled Meds: . clonazePAM  0.5 mg Oral BID  . feeding supplement (ENSURE ENLIVE)  237 mL Oral QID  . ferrous sulfate  325 mg Oral Q breakfast  . FLUoxetine  10 mg Oral QHS  . OXcarbazepine  75 mg Oral BID  . pantoprazole  40 mg Oral BID  . polyethylene glycol  17 g Oral BID  . QUEtiapine  100 mg Oral QHS  . QUEtiapine  75 mg Oral Daily  . senna-docusate  1 tablet Oral BID   Continuous Infusions: . sodium chloride    . sodium chloride 50 mL/hr at 06/05/20 1606  . ampicillin-sulbactam (UNASYN) IV 3 g (06/08/20 0841)   PRN Meds:.sodium chloride, acetaminophen, docusate sodium, guaiFENesin-dextromethorphan, hydrALAZINE, ipratropium-albuterol, LORazepam, oxyCODONE, sodium chloride flush  Antimicrobials: Anti-infectives (From admission, onward)   Start     Dose/Rate Route Frequency Ordered Stop   06/02/20 1630  Ampicillin-Sulbactam (UNASYN) 3 g in sodium chloride 0.9 % 100 mL IVPB     Discontinue     3 g 200 mL/hr over 30 Minutes Intravenous Every 6 hours 06/02/20 1626     06/02/20 0645  cefTRIAXone (ROCEPHIN) 2 g in sodium chloride 0.9 % 100 mL IVPB  Status:  Discontinued        2 g 200 mL/hr over 30 Minutes Intravenous Daily 06/02/20 0622 06/02/20 1607   06/01/20 0715  cefTRIAXone (ROCEPHIN) 1 g in sodium chloride 0.9 % 100 mL IVPB  Status:  Discontinued        1 g 200 mL/hr over 30 Minutes Intravenous Every 24 hours 06/01/20 0710 06/02/20 0622   03/26/20 1100  doxycycline (VIBRAMYCIN) 100 mg in  sodium chloride 0.9 % 250 mL IVPB  Status:  Discontinued        100 mg 125 mL/hr over 120 Minutes Intravenous 2 times daily 03/26/20 1002 04/01/20 1035   03/25/20 1100  doxycycline (VIBRA-TABS) tablet 100 mg  Status:  Discontinued        100 mg Per Tube Every 12 hours 03/25/20 1009 03/26/20 1002   03/25/20 1015  doxycycline (VIBRAMYCIN) 100 mg in sodium chloride 0.9 % 250 mL IVPB  Status:  Discontinued       Note to Pharmacy: To be given after the tracheal aspirate is collected.   100 mg 125 mL/hr over 120 Minutes Intravenous Every 12 hours 03/25/20 1005 03/25/20 1008       I have personally reviewed the following labs and images: CBC: Recent Labs  Lab 06/03/20 1134 06/04/20 1058 06/05/20 0039 06/06/20 0347 06/08/20 0327  WBC 17.5* 13.2* 13.4* 14.1*  15.3*  NEUTROABS  --   --   --   --  12.3*  HGB 9.5* 7.8* 7.6* 8.2* 7.2*  HCT 30.1* 24.6* 23.8* 26.1* 23.1*  MCV 87.5 86.6 87.5 87.9 87.2  PLT 289 299 313 362 446*   BMP &GFR Recent Labs  Lab 06/02/20 0337 06/03/20 1134 06/04/20 1058 06/05/20 0039 06/06/20 0347  NA 138 139 140 141 143  K 3.8 3.8 3.4* 4.6 3.8  CL 105 104 106 108 108  CO2 24 26 24 24 25   GLUCOSE 101* 98 122* 98 92  BUN 10 11 8  7* 6*  CREATININE 0.97 0.87 0.98 0.86 0.76  CALCIUM 8.4* 8.7* 8.4* 8.2* 8.4*  MG  --   --   --  1.9  --    Estimated Creatinine Clearance: 90.3 mL/min (by C-G formula based on SCr of 0.76 mg/dL). Liver & Pancreas: Recent Labs  Lab 06/05/20 0039  AST 18  ALT 10  ALKPHOS 86  BILITOT 0.8  PROT 5.3*  ALBUMIN 1.9*   No results for input(s): LIPASE, AMYLASE in the last 168 hours. Recent Labs  Lab 06/04/20 1058  AMMONIA 18   Diabetic: No results for input(s): HGBA1C in the last 72 hours. No results for input(s): GLUCAP in the last 168 hours. Cardiac Enzymes: No results for input(s): CKTOTAL, CKMB, CKMBINDEX, TROPONINI in the last 168 hours. No results for input(s): PROBNP in the last 8760 hours. Coagulation Profile: No  results for input(s): INR, PROTIME in the last 168 hours. Thyroid Function Tests: No results for input(s): TSH, T4TOTAL, FREET4, T3FREE, THYROIDAB in the last 72 hours. Lipid Profile: No results for input(s): CHOL, HDL, LDLCALC, TRIG, CHOLHDL, LDLDIRECT in the last 72 hours. Anemia Panel: No results for input(s): VITAMINB12, FOLATE, FERRITIN, TIBC, IRON, RETICCTPCT in the last 72 hours. Urine analysis:    Component Value Date/Time   COLORURINE AMBER (A) 06/01/2020 0506   APPEARANCEUR HAZY (A) 06/01/2020 0506   LABSPEC 1.025 06/01/2020 0506   PHURINE 5.0 06/01/2020 0506   GLUCOSEU NEGATIVE 06/01/2020 0506   HGBUR NEGATIVE 06/01/2020 0506   BILIRUBINUR NEGATIVE 06/01/2020 0506   KETONESUR NEGATIVE 06/01/2020 0506   PROTEINUR NEGATIVE 06/01/2020 0506   NITRITE NEGATIVE 06/01/2020 0506   LEUKOCYTESUR LARGE (A) 06/01/2020 0506   Sepsis Labs: Invalid input(s): PROCALCITONIN, [place]  Microbiology: Recent Results (from the past 240 hour(s))  SARS CORONAVIRUS 2 (TAT 6-24 HRS) Nasopharyngeal Nasopharyngeal Swab     Status: None   Collection Time: 05/30/20 11:25 AM   Specimen: Nasopharyngeal Swab  Result Value Ref Range Status   SARS Coronavirus 2 NEGATIVE NEGATIVE Final    Comment: (NOTE) SARS-CoV-2 target nucleic acids are NOT DETECTED.  The SARS-CoV-2 RNA is generally detectable in upper and lower respiratory specimens during the acute phase of infection. Negative results do not preclude SARS-CoV-2 infection, do not rule out co-infections with other pathogens, and should not be used as the sole basis for treatment or other patient management decisions. Negative results must be combined with clinical observations, patient history, and epidemiological information. The expected result is Negative.  Fact Sheet for Patients: SugarRoll.be  Fact Sheet for Healthcare Providers: https://www.woods-mathews.com/  This test is not yet approved  or cleared by the [place] FDA and  has been authorized for detection and/or diagnosis of SARS-CoV-2 by FDA under an Emergency Use Authorization (EUA). This EUA will remain  in effect (meaning this test can be used) for the duration of the COVID-19 declaration under Se ction 564(b)(1) of the  Act, 21 U.S.C. section 360bbb-3(b)(1), unless the authorization is terminated or revoked sooner.  Performed at [facility], [place] [address] [place], [place] [address]   Culture, blood (single)     Status: Abnormal   Collection Time: 06/01/20  5:37 AM   Specimen: BLOOD LEFT HAND  Result Value Ref Range Status   Specimen Description BLOOD LEFT HAND  Final   Special Requests   Final    BOTTLES DRAWN AEROBIC AND ANAEROBIC Blood Culture adequate volume   Culture  Setup Time   Final    IN BOTH AEROBIC AND ANAEROBIC BOTTLES GRAM POSITIVE COCCI IN CHAINS CRITICAL RESULT CALLED TO, READ BACK BY AND VERIFIED WITH: G ABBOTT PHARMD 06/02/20 0616 JDW    Culture (A)  Final    STREPTOCOCCUS CONSTELLATUS THE SIGNIFICANCE OF ISOLATING THIS ORGANISM FROM A SINGLE SET OF BLOOD CULTURES WHEN MULTIPLE SETS ARE DRAWN IS UNCERTAIN. PLEASE NOTIFY THE MICROBIOLOGY DEPARTMENT WITHIN ONE WEEK IF SPECIATION AND SENSITIVITIES ARE REQUIRED. Performed at [facility], [place] [address], [place], [place] [address]    Report Status 06/03/2020 FINAL  Final  Blood Culture ID Panel (Reflexed)     Status: Abnormal   Collection Time: 06/01/20  5:37 AM  Result Value Ref Range Status   Enterococcus species NOT DETECTED NOT DETECTED Final   Listeria monocytogenes NOT DETECTED NOT DETECTED Final   Staphylococcus species NOT DETECTED NOT DETECTED Final   Staphylococcus aureus (BCID) NOT DETECTED NOT DETECTED Final   Streptococcus species DETECTED (A) NOT DETECTED Final    Comment: Not Enterococcus species, Streptococcus agalactiae, Streptococcus pyogenes, or Streptococcus pneumoniae. CRITICAL RESULT CALLED TO, READ BACK  BY AND VERIFIED WITH: G ABBOTT PHARMD 06/02/20 0616 JDW    Streptococcus agalactiae NOT DETECTED NOT DETECTED Final   Streptococcus pneumoniae NOT DETECTED NOT DETECTED Final   Streptococcus pyogenes NOT DETECTED NOT DETECTED Final   Acinetobacter baumannii NOT DETECTED NOT DETECTED Final   Enterobacteriaceae species NOT DETECTED NOT DETECTED Final   Enterobacter cloacae complex NOT DETECTED NOT DETECTED Final   Escherichia coli NOT DETECTED NOT DETECTED Final   Klebsiella oxytoca NOT DETECTED NOT DETECTED Final   Klebsiella pneumoniae NOT DETECTED NOT DETECTED Final   Proteus species NOT DETECTED NOT DETECTED Final   Serratia marcescens NOT DETECTED NOT DETECTED Final   Haemophilus influenzae NOT DETECTED NOT DETECTED Final   Neisseria meningitidis NOT DETECTED NOT DETECTED Final   Pseudomonas aeruginosa NOT DETECTED NOT DETECTED Final   Candida albicans NOT DETECTED NOT DETECTED Final   Candida glabrata NOT DETECTED NOT DETECTED Final   Candida krusei NOT DETECTED NOT DETECTED Final   Candida parapsilosis NOT DETECTED NOT DETECTED Final   Candida tropicalis NOT DETECTED NOT DETECTED Final    Comment: Performed at [facility], [place] [address], [place], [place] [address]  Urine Culture     Status: Abnormal   Collection Time: 06/01/20  8:57 AM   Specimen: Urine, Random  Result Value Ref Range Status   Specimen Description URINE, RANDOM  Final   Special Requests Normal  Final   Culture (A)  Final    <10,000 COLONIES/mL INSIGNIFICANT GROWTH Performed at [facility], [place] [address], [place], [place] [address]    Report Status 06/02/2020 FINAL  Final  Culture, blood (Routine X 2) w Reflex to ID Panel     Status: None   Collection Time: 06/02/20 10:59 AM   Specimen: BLOOD  Result Value Ref Range Status  Specimen Description BLOOD LEFT ANTECUBITAL  Final   Special Requests   Final    BOTTLES DRAWN AEROBIC ONLY Blood Culture results may not be optimal due to an inadequate  volume of blood received in culture bottles   Culture   Final    NO GROWTH 5 DAYS Performed at [facility], [place] [address], [place], [place] [address]    Report Status 06/07/2020 FINAL  Final  Culture, blood (Routine X 2) w Reflex to ID Panel     Status: Abnormal   Collection Time: 06/02/20 10:59 AM   Specimen: BLOOD  Result Value Ref Range Status   Specimen Description BLOOD RIGHT ANTECUBITAL  Final   Special Requests   Final    BOTTLES DRAWN AEROBIC ONLY Blood Culture adequate volume   Culture  Setup Time   Final    GRAM POSITIVE COCCI AEROBIC BOTTLE ONLY CRITICAL VALUE NOTED.  VALUE IS CONSISTENT WITH PREVIOUSLY REPORTED AND CALLED VALUE. Performed at [facility], [place] [address], [place], [place] [address]    Culture STAPHYLOCOCCUS EPIDERMIDIS (A)  Final   Report Status 06/07/2020 FINAL  Final  Culture, blood (routine x 2)     Status: None (Preliminary result)   Collection Time: 06/05/20  7:40 PM   Specimen: BLOOD LEFT HAND  Result Value Ref Range Status   Specimen Description BLOOD LEFT HAND  Final   Special Requests   Final    BOTTLES DRAWN AEROBIC ONLY Blood Culture adequate volume   Culture   Final    NO GROWTH 3 DAYS Performed at [facility], [place] [address], [place], [place] [address]    Report Status PENDING  Incomplete    Radiology Studies: No results found.    Julus Kelley T. Addis  If 7PM-7AM, please contact night-coverage [email or web address] Password [facility] 06/08/2020, 3:27 PM

## 2020-06-08 NOTE — Progress Notes (Signed)
HEMATOLOGY-ONCOLOGY PROGRESS NOTE  SUBJECTIVE: He reports abdominal pain last night.  He had neck pain when I saw him yesterday morning.  This has improved.  PHYSICAL EXAMINATION:  Vitals:   06/07/20 2014 06/08/20 0820  BP: 94/69 110/79  Pulse: 99 98  Resp:  18  Temp: 98.2 F (36.8 C) 98.5 F (36.9 C)  SpO2: 97% 100%   Filed Weights   05/30/20 0500 05/31/20 0500 06/01/20 0254  Weight: 149 lb (67.6 kg) 140 lb (63.5 kg) 147 lb (66.7 kg)    Intake/Output from previous day: 06/23 0701 - 06/24 0700 In: 200 [IV Piggyback:200] Out: -   GENERAL:alert, no distress and comfortable HEENT: No pain with neck motion ABDOMEN:abdomen soft, non-tender and normal bowel sounds  NEURO: Oriented to place, not diagnosis  LABORATORY DATA:  I have reviewed the data as listed CMP Latest Ref Rng & Units 06/06/2020 06/05/2020 06/04/2020  Glucose 70 - 99 mg/dL 92 98 122(H)  BUN 8 - 23 mg/dL 6(L) 7(L) 8  Creatinine 0.61 - 1.24 mg/dL 0.76 0.86 0.98  Sodium 135 - 145 mmol/L 143 141 140  Potassium 3.5 - 5.1 mmol/L 3.8 4.6 3.4(L)  Chloride 98 - 111 mmol/L 108 108 106  CO2 22 - 32 mmol/L _0 Calcium 8.9 - 10.3 mg/dL 8.4(L) 8.2(L) 8.4(L)  Total Protein 6.5 - 8.1 g/dL - 5.3(L) -  Total Bilirubin 0.3 - 1.2 mg/dL - 0.8 -  Alkaline Phos 38 - 126 U/L - 86 -  AST 15 - 41 U/L - 18 -  ALT 0 - 44 U/L - 10 -    Lab Results  Component Value Date   WBC 15.3 (H) 06/08/2020   HGB 7.2 (L) 06/08/2020   HCT 23.1 (L) 06/08/2020   MCV 87.2 06/08/2020   PLT 446 (H) 06/08/2020   NEUTROABS 12.3 (H) 06/08/2020    CT ABDOMEN PELVIS W CONTRAST  Result Date: 06/04/2020 CLINICAL DATA:  Abdominal pain. Fever of unknown origin. Upper GI bleed secondary to large malignant gastric ulcer with acute blood loss anemia. Bloody stool last night. EXAM: CT ABDOMEN AND PELVIS WITH CONTRAST TECHNIQUE: Multidetector CT imaging of the abdomen and pelvis was performed using the standard protocol following bolus administration of  intravenous contrast. CONTRAST:  163m OMNIPAQUE IOHEXOL 300 MG/ML  SOLN COMPARISON:  Abdominal CT 05/01/2020 FINDINGS: Lower chest: Small right pleural effusion which has slightly increased from CT last month. Trace left pleural effusion which is new. Scattered basilar atelectasis. Heart is normal in size. Hepatobiliary: Increased size of dominant left lobe liver lesion since last month, currently 8 cm, previously 5.4 cm. Subcapsular right lobe lesion is also increased in size currently 4.5 cm, previously 2.4 cm. Smaller subcapsular lesion in the right lobe is also slightly increased in size. There are new lesions in the central right lobe, series 3, image 11, and possibly left lobe series 3, image 26. Gallbladder physiologically distended. No biliary dilatation. Pancreas: No ductal dilatation or inflammation. Spleen: Normal in size without focal abnormality. Adrenals/Urinary Tract: Normal adrenal glands. No hydronephrosis or perinephric edema. Homogeneous renal enhancement with symmetric excretion on delayed phase imaging. Left renal cyst unchanged from prior exam. Urinary bladder is partially distended without wall thickening. Stomach/Bowel: The stomach is nondistended. The known gastric mass is difficult to delineate by CT, but roughly in the region of series 3, image 24. There is no obvious gastric wall thickening. No small bowel obstruction. Administered enteric contrast reaches the colon. No small bowel inflammation. Moderate colonic stool  burden without colonic wall thickening. There is sigmoid colonic tortuosity. Vascular/Lymphatic: Infrarenal IVC filter in place. Aortic atherosclerosis without aneurysm. The portal vein is patent. Embolization coils in the upper abdomen. No bulky abdominopelvic adenopathy. Reproductive: Prostate is unremarkable. Other: Trace loculated subcapsular fluid adjacent to the left lobe of the liver. No other ascites. Trace presacral edema. Generalized body wall edema in the flanks.  No free intra-abdominal air or abscess. Musculoskeletal: Mild chronic L2 superior endplate compression fracture, unchanged. No acute osseous abnormality or focal bone lesion. IMPRESSION: 1. Increased size of hepatic metastasis since CT last month. There is also a new small liver lesion. 2. The known gastric mass is difficult to delineate on the current exam given nondistended stomach. 3. Small right and trace left pleural effusions, slightly increased from CT last month. 4. Moderate colonic stool burden with sigmoid colonic tortuosity, can be seen with constipation. No bowel obstruction or inflammation. Aortic Atherosclerosis (ICD10-I70.0). Electronically Signed   By: Keith Rake M.D.   On: 06/04/2020 00:50   DG CHEST PORT 1 VIEW  Result Date: 06/01/2020 CLINICAL DATA:  History of previous pulmonary embolism with right ventricular strain, cough and fevers EXAM: PORTABLE CHEST 1 VIEW COMPARISON:  04/04/2020 FINDINGS: Cardiac shadow is stable. Tracheostomy tube and feeding catheter has been removed in the interval. Lungs are well aerated bilaterally. No focal infiltrate or sizable effusion is seen. Focal nodular density is again noted in the left upper lobe consistent with dilated pulmonary arterial branch. IMPRESSION: No acute abnormality noted. Electronically Signed   By: Inez Catalina M.D.   On: 06/01/2020 10:52    ASSESSMENT AND PLAN: 1.    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              -CT abdomen/pelvis 06/03/2020-increased size of hepatic metastases, new liver lesions 2.  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  Tracheostomy placement on 03/20/2020, now healed 4.  Cardiac arrest on 03/07/2020 5.  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  History of acute blood loss anemia likely due to upper GI bleed 7.  Delusions/hallucinations 8.  Fever/bacteremia 05/31/2020  9.  Anemia secondary to chronic disease, infection, and GI bleeding  Mr. Wigington is alert and his fever has improved.  He remains confused.  I remain confounded on whether to recommend systemic chemotherapy versus hospice care.  He has altered mental status, no clear disposition plan, and no involved family.  I spoke to his niece on the phone earlier this week and she said that she plan to visit him the next day.  There is no report of her visiting the hospital this week.  I recommend an ethics evaluation regarding the indication for treatment in his case.  He has an incurable malignancy, with a significant chance of partial clinical remission following chemotherapy.  No therapy will be curative.  Recommendations: 1.  Continue antibiotics and medical therapy per the hospitalist service 2.  Await PD-L1, HER-2, and foundation 1 testing. 3.  Reconsult palliative care for ethics discussion 4.  Follow-up at the cancer center pending above discussion and disposition plan   LOS: 93 days    Betsy Coder, MD 06/08/20

## 2020-06-08 NOTE — Progress Notes (Signed)
Nutrition Follow-up  DOCUMENTATION CODES:   Underweight, Severe malnutrition in context of chronic illness  INTERVENTION:   - Feeding assistance with meals and snacks  -Ensure Enlive poQID, each supplement provides 350 kcal and 20 grams of protein  -MagicCup TID with meals, each supplement provides 290 kcal and 9 grams of protein  - Encourage adequate PO intake  NUTRITION DIAGNOSIS:   Severe Malnutrition related to chronic illness (gastric lesion, multiple liver lesions, concern for metastatic disease) as evidenced by severe fat depletion, severe muscle depletion.  Ongoing  GOAL:   Patient will meet greater than or equal to 90% of their needs  Not meeting  MONITOR:   PO intake, Supplement acceptance, Labs, Weight trends  REASON FOR ASSESSMENT:   Ventilator    ASSESSMENT:   64 yo male admitted S/P cardiac arrest, S/P normothermia protocol. Found to have massive PE, DVT, GI bleed from gastric ulcer. PMH includes current smoker, heavy alcohol use.  4/02 - trach  4/09 - Cortrak placed  4/11- pt pulled out trach, replaced by RT,TF held due to Cortrak leaking  4/13 - MBS, Dysphagia 1 with thin liquids started 4/14 - Cortrak removed 4/16 - Cortrak replaced 4/22 - Cortrak clogged, replaced by diagnostic radiology (tip in pre-pyloric region of stomach) 4/25 - diet advanced to Dysphagia 2 4/27 - tube feeds changed to nocturnal 4/28 - decannulated 4/30 - pulled Cortrak, TF stopped 5/03 - diet upgraded to Dysphagia 3 5/05 - diet upgraded to Regular 5/19 - s/p liver lesion biopsy by IR 5/28- EGD reveals gastric adenocarcinoma  Appetite has declined significantly over the last week due to fluctuating mental status and abdominal pain. Pt consuming only bites each meal per nurse. Drinking mostly coffee and orange juice. Taking Ensure inconsistently. Ethics involved to determine course of action. May need to consider long term feeding tube if aggressive treatment is  desired.   Admission weight: 73.6 kg  Current weight: 66.7 kg   Medications: ferrous sulfate, miralax, senokot Labs: reviewed   Diet Order:   Diet Order            Diet regular Room service appropriate? No; Fluid consistency: Thin  Diet effective now                 EDUCATION NEEDS:   Not appropriate for education at this time  Skin:  Skin Assessment: Reviewed RN Assessment Skin Integrity Issues:: Other (Comment) Other: puncture neck, groin  Last BM:  6/23  Height:   Ht Readings from Last 1 Encounters:  04/29/20 6\' 2"  (1.88 m)    Weight:   Wt Readings from Last 1 Encounters:  06/01/20 66.7 kg    Ideal Body Weight:  86.4 kg  BMI:  Body mass index is 18.87 kg/m.  Estimated Nutritional Needs:   Kcal:  2100-2300  Protein:  110-125 gm  Fluid:  >/= 2 L   Mariana Single RD, LDN Clinical Nutrition Pager listed in [place]

## 2020-06-08 NOTE — Progress Notes (Signed)
Physical Therapy Treatment Patient Details Name: Kenneth Sullivan MRN: [id number] DOB: 06-19-1956 Today's Date: 06/08/2020    History of Present Illness Pt is a 64 y.o. male admitted 03/07/20 with acute SOB; had bradycardic event with asystole/cardiac arrest, ROSC achieved in 20 minutes with 3 rounds epinephrine; intubated 3/23. Pt with massive PE/DVT and RV strain. S/p IR guided mechanical thrombectomy and IVC filter placement. Devoloped gastric ulcer s/p GDA embolization. MRI 3/27 revealed multifocal acute to subacute ischemic infarct involving bilateral cerebral hemisphere consistent with global hypoperfusion; associated scattered petechial hemorrhages with evidence of hemmorhagic conversion in R parietal lobe. S/p trach 4/5, changed to cuffless 4/22; decannulated 4/28. Liver biopsy 5/19. 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 On 06/01/20 - pt with UTI.  On 06/08/20 - pt continues to have a fever, mention of possible hospice in notes if does not improve. PMH unknown.    PT Comments    Pt continues to have a decline in function.  His confusion and reports of pain have worsened leading to further decline in physical mobility.  Required max encouragement for exercises and sitting EOB.  Declined further therapy.  Cont POC as able.     Follow Up Recommendations  Supervision/Assistance - 24 hour;SNF     Equipment Recommendations  Wheelchair cushion (measurements PT);Wheelchair (measurements PT);3in1 (PT)    Recommendations for Other Services       Precautions / Restrictions Precautions Precautions: Fall Precaution Comments: impulsive    Mobility  Bed Mobility Overal bed mobility: Needs Assistance Bed Mobility: Supine to Sit;Sit to Supine     Supine to sit: Mod assist Sit to supine: Mod assist   General bed mobility comments: increased time and effort; max multimodal cues and assist to initiate  Transfers                  General transfer comment: pt refused  Ambulation/Gait             General Gait Details: pt refuesed   Stairs             Wheelchair Mobility    Modified Rankin (Stroke Patients Only) Modified Rankin (Stroke Patients Only) Pre-Morbid Rankin Score: No symptoms Modified Rankin: Moderately severe disability     Balance Overall balance assessment: Needs assistance Sitting-balance support: No upper extremity supported Sitting balance-Leahy Scale: Good Sitting balance - Comments: Sat EOB for 15 minutes for exercises and pt ate a couple bites of ice cream and a juice       Standing balance comment: refused                            Cognition Arousal/Alertness: Awake/alert Behavior During Therapy: Flat affect Overall Cognitive Status: Impaired/Different from baseline Area of Impairment: Attention;Following commands;Safety/judgement;Awareness;Problem solving;Memory;Orientation                 Orientation Level: Disoriented to;Situation;Time;Place   Memory: Decreased short-term memory;Decreased recall of precautions Following Commands: Follows one step commands inconsistently Safety/Judgement: Decreased awareness of deficits;Decreased awareness of safety   Problem Solving: Requires tactile cues;Requires verbal cues;Slow processing;Decreased initiation;Difficulty sequencing General Comments: Pt talking to people not in room , confused, non-sensical conversation, and reports seeing roaches on his table during therapy.      Exercises General Exercises - Lower Extremity Ankle Circles/Pumps: AROM;Both;10 reps;Seated Long Arc Quad: Both;10 reps;Seated;AAROM Hip Flexion/Marching: AAROM;Both;10 reps;Seated    General Comments  Pertinent Vitals/Pain Pain Assessment: Faces Faces Pain Scale: Hurts even more Pain Location: stomach Pain Descriptors / Indicators: Discomfort;Burning;Grimacing Pain Intervention(s): Monitored during  session;Limited activity within patient's tolerance    Home Living                      Prior Function            PT Goals (current goals can now be found in the care plan section) Acute Rehab PT Goals Patient Stated Goal: pt unable PT Goal Formulation: With patient Time For Goal Achievement: 06/16/20 Potential to Achieve Goals: Poor Progress towards PT goals: Not progressing toward goals - comment (worsening confusion)    Frequency    Min 2X/week      PT Plan Current plan remains appropriate    Co-evaluation              AM-PAC PT "6 Clicks" Mobility   Outcome Measure  Help needed turning from your back to your side while in a flat bed without using bedrails?: A Little Help needed moving from lying on your back to sitting on the side of a flat bed without using bedrails?: A Lot Help needed moving to and from a bed to a chair (including a wheelchair)?: A Lot Help needed standing up from a chair using your arms (e.g., wheelchair or bedside chair)?: A Lot Help needed to walk in hospital room?: A Lot Help needed climbing 3-5 steps with a railing? : Total 6 Click Score: 12    End of Session   Activity Tolerance: Other (comment) (limited by fatigue, pain, and confusion) Patient left: with call bell/phone within reach;in bed;with bed alarm set Nurse Communication: Mobility status PT Visit Diagnosis: Unsteadiness on feet (R26.81);Other abnormalities of gait and mobility (R26.89)     Time: 1410-1438 PT Time Calculation (min) (ACUTE ONLY): 28 min  Charges:  $Therapeutic Exercise: 8-22 mins $Therapeutic Activity: 8-22 mins                     Abran Richard, PT Acute Rehab Services Pager [phone] [facility] [phone]     [name] 06/08/2020, 2:48 PM

## 2020-06-08 NOTE — Progress Notes (Signed)
Occupational Therapy Treatment Patient Details Name: Lorain Keast MRN: [id number] DOB: 1956/06/16 Today's Date: 06/08/2020    History of present illness Pt is a 64 y.o. male admitted 03/07/20 with acute SOB; had bradycardic event with asystole/cardiac arrest, ROSC achieved in 20 minutes with 3 rounds epinephrine; intubated 3/23. Pt with massive PE/DVT and RV strain. S/p IR guided mechanical thrombectomy and IVC filter placement. Devoloped gastric ulcer s/p GDA embolization. MRI 3/27 revealed multifocal acute to subacute ischemic infarct involving bilateral cerebral hemisphere consistent with global hypoperfusion; associated scattered petechial hemorrhages with evidence of hemmorhagic conversion in R parietal lobe. S/p trach 4/5, changed to cuffless 4/22; decannulated 4/28. Liver biopsy 5/19. 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 On 06/01/20 - pt with new UTI.  PMH unknown.   OT comments  Patient is making minimal towards goals and is steadily declining in skilled OT session. Patient's session encompassed attempts to increase overall cognition and self-feeding in session. While pt is oriented to self and that hes in the hospital, pt is stating tangential and nonsensical conversation with increased frequency, stating things such as "Put her food in the ice box, Im not sure when she will be back". Pt is now demonstrating significant difficulty to self feed, pt with spoon in lap and oatmeal spilled on chest (RN and tech notified that pt is now dependent for feeding). Despite increased trials, pt was unable to motor plan any sort of self feeding and required frequent cues to reorient to task. Discharge remains appropriate, however therapist is in agreement with physician for pt to have a hospice consult. Therapy will continue to follow acutely.    Follow Up Recommendations  SNF;Supervision/Assistance - 24 hour    Equipment  Recommendations       Recommendations for Other Services      Precautions / Restrictions Precautions Precautions: Fall Precaution Comments: impulsive Restrictions Weight Bearing Restrictions: No       Mobility Bed Mobility                  Transfers                      Balance                                           ADL either performed or assessed with clinical judgement   ADL Overall ADL's : Needs assistance/impaired Eating/Feeding: Sitting;Total assistance Eating/Feeding Details (indicate cue type and reason): Pt now requiring hand over hand or total assist to self feed, therapist attempted on multiple occasions by placing spoon or utensils in hand, however cognition is worsening and pt would look up and addressing people who werent in the room                                 Functional mobility during ADLs: Total assistance;Cueing for safety;Cueing for sequencing General ADL Comments: Pt continues to decline, now talking to people in the room that arent there consistenty, was not able to recognize therapist each time she came into the room, and at the end of the session stated "I dont know when shes coming back, you better put her food in the icebox."     Vision       Perception  Praxis      Cognition Arousal/Alertness: Awake/alert Behavior During Therapy: Flat affect Overall Cognitive Status: Impaired/Different from baseline Area of Impairment: Attention;Following commands;Safety/judgement;Awareness;Problem solving;Memory                 Orientation Level: Disoriented to;Situation;Time Current Attention Level: Sustained Memory: Decreased short-term memory;Decreased recall of precautions Following Commands: Follows one step commands with increased time Safety/Judgement: Decreased awareness of deficits;Decreased awareness of safety Awareness: Intellectual Problem Solving: Requires tactile  cues;Requires verbal cues;Slow processing;Decreased initiation;Difficulty sequencing General Comments: Pt continues to decline, now talking to people in the room that arent there consistenty, was not able to recognize therapist each time she came into the room, and at the end of the session stated "I dont know when shes coming back, you better put her food in the icebox."        Exercises     Shoulder Instructions       General Comments      Pertinent Vitals/ Pain       Pain Assessment: Faces Faces Pain Scale: Hurts a little bit Pain Descriptors / Indicators: Discomfort Pain Intervention(s): Limited activity within patient's tolerance;Monitored during session  Home Living                                          Prior Functioning/Environment              Frequency  Min 2X/week        Progress Toward Goals  OT Goals(current goals can now be found in the care plan section)  Progress towards OT goals: Not progressing toward goals - comment (Pt continues to decline with regard to cognition and funcitonal abilities)  Acute Rehab OT Goals Patient Stated Goal: pt unable OT Goal Formulation: Patient unable to participate in goal setting Time For Goal Achievement: 06/15/20 Potential to Achieve Goals: Poor  Plan Discharge plan remains appropriate    Co-evaluation                 AM-PAC OT "6 Clicks" Daily Activity     Outcome Measure   Help from another person eating meals?: Total Help from another person taking care of personal grooming?: A Lot Help from another person toileting, which includes using toliet, bedpan, or urinal?: A Lot Help from another person bathing (including washing, rinsing, drying)?: A Lot Help from another person to put on and taking off regular upper body clothing?: A Lot Help from another person to put on and taking off regular lower body clothing?: A Lot 6 Click Score: 11    End of Session    OT Visit Diagnosis:  Unsteadiness on feet (R26.81);Other abnormalities of gait and mobility (R26.89);Muscle weakness (generalized) (M62.81);Low vision, both eyes (H54.2);Other symptoms and signs involving cognitive function   Activity Tolerance Patient limited by fatigue   Patient Left in bed;with call bell/phone within reach;with bed alarm set   Nurse Communication Mobility status        Time: 6644-0347 OT Time Calculation (min): 19 min  Charges: OT General Charges $OT Visit: 1 Visit OT Treatments $Self Care/Home Management : 8-22 mins  Corinne Ports E. [place], [place] [facility] [place] 06/08/2020, 10:42 AM

## 2020-06-09 LAB — CBC WITH DIFFERENTIAL/PLATELET
Abs Immature Granulocytes: 0.12 10*3/uL — ABNORMAL HIGH (ref 0.00–0.07)
Basophils Absolute: 0 10*3/uL (ref 0.0–0.1)
Basophils Relative: 0 %
Eosinophils Absolute: 0.2 10*3/uL (ref 0.0–0.5)
Eosinophils Relative: 1 %
HCT: 26.5 % — ABNORMAL LOW (ref 39.0–52.0)
Hemoglobin: 8.1 g/dL — ABNORMAL LOW (ref 13.0–17.0)
Immature Granulocytes: 1 %
Lymphocytes Relative: 10 %
Lymphs Abs: 1.7 10*3/uL (ref 0.7–4.0)
MCH: 27.1 pg (ref 26.0–34.0)
MCHC: 30.6 g/dL (ref 30.0–36.0)
MCV: 88.6 fL (ref 80.0–100.0)
Monocytes Absolute: 1 10*3/uL (ref 0.1–1.0)
Monocytes Relative: 6 %
Neutro Abs: 13.7 10*3/uL — ABNORMAL HIGH (ref 1.7–7.7)
Neutrophils Relative %: 82 %
Platelets: 604 10*3/uL — ABNORMAL HIGH (ref 150–400)
RBC: 2.99 MIL/uL — ABNORMAL LOW (ref 4.22–5.81)
RDW: 17.5 % — ABNORMAL HIGH (ref 11.5–15.5)
WBC: 16.7 10*3/uL — ABNORMAL HIGH (ref 4.0–10.5)
nRBC: 0 % (ref 0.0–0.2)

## 2020-06-09 NOTE — Progress Notes (Signed)
PROGRESS NOTE  Starr Engel FYB:[id number] DOB: 31-Aug-1956   PCP: [facility], [name]  Patient is from: home  DOA: 03/07/2020 LOS: 26  Brief Narrative / Interim history: 64 year old with no prior medical history who had acute shortness of breath respiratory failure oxygen saturation in the 40s on EMS arrival.  He had a witnessed bradycardic, asystole arrest when EMS was present with 3 rounds of epi, 20 minutes of CPR prior to [place]. Underwent normothermia protocol.  Work-up was significant for massive PE, DVT with right ventricular strain.  Unfortunately hospital course was complicated by upper GI bleed due to gastric ulcer.  A CT abdomen pelvis showed a large antral mass and multiple liver lesions biopsy confirmed a high-grade neuroendocrine tumor, subsequent EGD with biopsy revealing adenocarcinoma.  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   For his massive PE patient underwent IR guided mechanical thrombectomy and IVC filter.  Post procedure, while on heparin drip patient is started having maroon-colored red stool and bloody drainage from OGT.  GI did endoscopy which showed oozing gastric ulcer with clots.  IR did GDA embolization.  Patient remained encephalopathic. On 3/27, MRI of the brain showed multifocal acute to subacute ischemic infarct involving bilateral cerebral hemisphere consistent with global hypoperfusion related to cardiac arrest, also associated with scatter petechial hemorrhage with evidence of hemorrhagic conversion in the parietal lobe. CT abdomen and pelvis showed gastric lesion in the antrum concerning for antral mass and multiple liver lesion concerning for metastatic disease IR consulted and he underwent liver biopsy on 5/19.  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  Patient developed fever, leukocytosis on June 17.  Blood culture on 6/17 with Streptococcus Constellatus  (but one sample).  Repeat blood culture on 6/18 with staph epidermis in 1 out of 2 bottles 1 out of 2 growing staph coagulase negative likely contaminant.    Blood culture on 6/21 NGTD. Patient was started on Unasyn.  CT abdomen pelvis negative for abscess, but showed worsening liver metastatic diseases.  Oncology, Dr Benay Spice discussed case with patient's niece, [name] over the phone, and recommended the family to establish HCPOA and hospice unless his fever resolves and he returns today eating/ambulating to be considered for FOLFOX.  This is also complicated by complex medical and social issues   Subjective: Seen and examined earlier this morning.  No major events overnight of this morning.  Seems to be hallucinating. No distress or agitation.  Objective: Vitals:   06/08/20 1636 06/08/20 1950 06/09/20 0759 06/09/20 1543  BP: 116/73 96/69 (!) 136/93 119/73  Pulse: (!) 114 100 (!) 106 (!) 106  Resp: 18 14 16 16   Temp: 98.7 F (37.1 C) 97.9 F (36.6 C) 98.7 F (37.1 C) 97.9 F (36.6 C)  TempSrc:      SpO2: 95% 95% 98% 91%  Weight:      Height:        Intake/Output Summary (Last 24 hours) at 06/09/2020 1618 Last data filed at 06/09/2020 0500 Gross per 24 hour  Intake --  Output 400 ml  Net -400 ml   Filed Weights   05/30/20 0500 05/31/20 0500 06/01/20 0254  Weight: 67.6 kg 63.5 kg 66.7 kg    Examination:  GENERAL: No apparent distress.  Nontoxic. HEENT: MMM.  Vision and hearing grossly intact.  NECK: Supple.  No apparent JVD.  RESP: On room air.  No IWOB.  Fair aeration bilaterally. CVS:  RRR. Heart sounds normal.  ABD/GI/GU: BS+. Abd soft, NTND.  MSK/EXT:  Moves extremities. No apparent deformity. No edema.  SKIN: no apparent skin lesion or wound NEURO: Awake, alert and oriented self and place.  No apparent focal neuro deficit. PSYCH: Calm.  Seems to have hallucination.  Significant events: 3/23>> Admit, EGD with findings of oozing gastric ulcer with clot, IR  for mechanical thrombectomy, IVC filter and GDA embolization 4/5>>Trach 4/28>>trach decannulated  Significant studies: CTA 3/23 >> bilateral PE with RV/with ratio 1.9, emphysema CT head 3/23 >>chronic microvascular changes. No acute findings Echo 3/23 >> RV is severely dilated with reduced function and D-shaped septum, LVEF 60-65%. Venogram 3/23>> occlusive DVT of the iliac vein extending to the lower IVC LE Venous Duplex 3/24 >>acute DVT in BLE up to the femoral vein  MRI Brain 3/28 >> multifocal acute to subacute ischemic infarcts involving bilateral cerebral hemispheres, findings consistent with global hypoperfusion event related to cardiac arrest, associated scattered petechial hemorrhage about many areas of ischemia, evidence of hemorrhagic conversion in the right parietal lobe  CT abdomen and pelvis on 6/19 with increased size of hepatic metastasis with new small liver lesions  Assessment & Plan: Acute hypoxic respiratory failure in the setting of massive PE/cardiac arrest: Resolved Managed in the ICU with status post tracheostomy subsequently decannulated.    Cardiac arrest with anoxic brain injury due to massive PE Massive PE with DVT status post IVC filter: -Status post IVC filter placement on mechanical thrombectomy. 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  His sepsis  physiology could also be due to malignancy. Repeat blood culture on 6/21 NGTD. -IV Unasyn for 10 days, then observe off antibiotics   Normocytic anemia: H&H stable. -Continue monitoring  Dysphagia:  Resolved.  On regular diet.  Delusion/hallucination:  -Psychologist, prison and probation services input appreciated. -Continue with Seroquel   Constipation  -schedule BR  Hypokalemia -Replenish and recheck  Leukocytosis/bandemia-due to bacteremia. Could be due to malignancy. -Check CBC intermittently  Debility/physical deconditioning -Continue PT/OT  Goal of care/DNR/DNI-patient is gradually deconditioning.  CT also showing increased liver mets with new lesions.  Oncology suggesting hospice given his current situation. -Needs further goal of care discussion-we will reconsult palliative care  Ethical issue-patient with new diagnosis of adenocarcinoma with metastasis to liver.  There is a question of treatment with chemo but multiple complicating factors medically and socially. 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 Although there is some improvement in his mental status, he is still confused with very poor insight into his situation.  No close family member other than niece, no place to stay, no reliable transportation... -Reconsulted palliative care for goal of care and ethical issues.  Severe malnutrition: Etiology chronic illness gastric lesion, multiple evaluation.  Continue with Ensure Body mass index is 18.87 kg/m. Nutrition Problem: Severe Malnutrition Etiology: chronic illness (gastric lesion, multiple liver lesions, concern for metastatic disease) Signs/Symptoms: severe fat depletion, severe muscle depletion Interventions: Ensure Enlive (each supplement provides 350kcal and 20 grams of protein), Magic cup  DVT prophylaxis:  SCDs Start: 03/07/20 0835  Code Status: DNR/DNI Family Communication: Patient and/or RN. Available if any question. Status is: Inpatient  Remains  inpatient appropriate because:Unsafe d/c plan   Dispo: The patient is from: Home              Anticipated d/c is to: To be determined              Anticipated d/c date is: > 3 days              Patient currently is medically stable to d/c.       Consultants:  Oncology Palliative medicine   Sch Meds:  Scheduled Meds: . clonazePAM  0.5 mg Oral BID  . feeding supplement (ENSURE ENLIVE)  237 mL Oral QID  . ferrous sulfate  325 mg Oral Q breakfast  . FLUoxetine  10 mg Oral QHS  . OXcarbazepine  75 mg Oral BID  . pantoprazole  40 mg Oral BID  . polyethylene glycol  17 g Oral BID  . QUEtiapine  100 mg Oral QHS  . QUEtiapine  75 mg Oral Daily  . senna-docusate  1 tablet Oral BID   Continuous Infusions: . sodium chloride    . sodium chloride 50 mL/hr at 06/05/20 1606  . ampicillin-sulbactam (UNASYN) IV 3 g (06/09/20 1535)   PRN Meds:.sodium chloride, acetaminophen, docusate sodium, guaiFENesin-dextromethorphan, hydrALAZINE, ipratropium-albuterol, LORazepam, oxyCODONE, sodium chloride flush  Antimicrobials: Anti-infectives (From admission, onward)   Start     Dose/Rate Route Frequency Ordered Stop   06/02/20 1630  Ampicillin-Sulbactam (UNASYN) 3 g in sodium chloride 0.9 % 100 mL IVPB     Discontinue     3 g 200 mL/hr over 30 Minutes Intravenous Every 6 hours 06/02/20 1626 06/10/20 2359   06/02/20 0645  cefTRIAXone (ROCEPHIN) 2 g in sodium chloride 0.9 % 100 mL IVPB  Status:  Discontinued        2 g 200 mL/hr over 30 Minutes Intravenous Daily 06/02/20 0622 06/02/20 1607   06/01/20 0715  cefTRIAXone (ROCEPHIN) 1 g in sodium chloride 0.9 % 100 mL IVPB  Status:  Discontinued        1 g 200 mL/hr over 30 Minutes Intravenous Every 24 hours 06/01/20 0710 06/02/20 0622   03/26/20 1100  doxycycline (VIBRAMYCIN) 100 mg in sodium chloride 0.9 % 250 mL IVPB  Status:  Discontinued        100 mg 125 mL/hr over 120 Minutes Intravenous 2 times daily 03/26/20 1002 04/01/20 1035   03/25/20  1100  doxycycline (VIBRA-TABS) tablet 100 mg  Status:  Discontinued        100 mg Per Tube Every 12 hours 03/25/20 1009 03/26/20 1002   03/25/20 1015  doxycycline (VIBRAMYCIN) 100 mg in sodium chloride 0.9 % 250 mL IVPB  Status:  Discontinued       Note to Pharmacy: To be given after the tracheal aspirate is collected.   100 mg 125 mL/hr over 120 Minutes Intravenous Every 12 hours 03/25/20 1005 03/25/20 1008       I have personally reviewed the following labs and images: CBC: Recent Labs  Lab 06/04/20 1058 06/05/20 0039 06/06/20 0347 06/08/20 0327 06/09/20 0751  WBC 13.2* 13.4* 14.1* 15.3* 16.7*  NEUTROABS  --   --   --  12.3* 13.7*  HGB 7.8* 7.6* 8.2* 7.2* 8.1*  HCT 24.6* 23.8* 26.1* 23.1* 26.5*  MCV 86.6 87.5 87.9 87.2 88.6  PLT 299 313 362 446* 604*  BMP &GFR Recent Labs  Lab 06/03/20 1134 06/04/20 1058 06/05/20 0039 06/06/20 0347  NA 139 140 141 143  K 3.8 3.4* 4.6 3.8  CL 104 106 108 108  CO2 26 24 24 25   GLUCOSE 98 122* 98 92  BUN 11 8 7* 6*  CREATININE 0.87 0.98 0.86 0.76  CALCIUM 8.7* 8.4* 8.2* 8.4*  MG  --   --  1.9  --    Estimated Creatinine Clearance: 90.3 mL/min (by C-G formula based on SCr of 0.76 mg/dL). Liver & Pancreas: Recent Labs  Lab 06/05/20 0039  AST 18  ALT 10  ALKPHOS 86  BILITOT 0.8  PROT 5.3*  ALBUMIN 1.9*   No results for input(s): LIPASE, AMYLASE in the last 168 hours. Recent Labs  Lab 06/04/20 1058  AMMONIA 18   Diabetic: No results for input(s): HGBA1C in the last 72 hours. No results for input(s): GLUCAP in the last 168 hours. Cardiac Enzymes: No results for input(s): CKTOTAL, CKMB, CKMBINDEX, TROPONINI in the last 168 hours. No results for input(s): PROBNP in the last 8760 hours. Coagulation Profile: No results for input(s): INR, PROTIME in the last 168 hours. Thyroid Function Tests: No results for input(s): TSH, T4TOTAL, FREET4, T3FREE, THYROIDAB in the last 72 hours. Lipid Profile: No results for input(s):  CHOL, HDL, LDLCALC, TRIG, CHOLHDL, LDLDIRECT in the last 72 hours. Anemia Panel: No results for input(s): VITAMINB12, FOLATE, FERRITIN, TIBC, IRON, RETICCTPCT in the last 72 hours. Urine analysis:    Component Value Date/Time   COLORURINE AMBER (A) 06/01/2020 0506   APPEARANCEUR HAZY (A) 06/01/2020 0506   LABSPEC 1.025 06/01/2020 0506   PHURINE 5.0 06/01/2020 0506   GLUCOSEU NEGATIVE 06/01/2020 0506   HGBUR NEGATIVE 06/01/2020 0506   BILIRUBINUR NEGATIVE 06/01/2020 0506   KETONESUR NEGATIVE 06/01/2020 0506   PROTEINUR NEGATIVE 06/01/2020 0506   NITRITE NEGATIVE 06/01/2020 0506   LEUKOCYTESUR LARGE (A) 06/01/2020 0506   Sepsis Labs: Invalid input(s): PROCALCITONIN, [place]  Microbiology: Recent Results (from the past 240 hour(s))  Culture, blood (single)     Status: Abnormal   Collection Time: 06/01/20  5:37 AM   Specimen: BLOOD LEFT HAND  Result Value Ref Range Status   Specimen Description BLOOD LEFT HAND  Final   Special Requests   Final    BOTTLES DRAWN AEROBIC AND ANAEROBIC Blood Culture adequate volume   Culture  Setup Time   Final    IN BOTH AEROBIC AND ANAEROBIC BOTTLES GRAM POSITIVE COCCI IN CHAINS CRITICAL RESULT CALLED TO, READ BACK BY AND VERIFIED WITH: G ABBOTT PHARMD 06/02/20 0616 JDW    Culture (A)  Final    STREPTOCOCCUS CONSTELLATUS THE SIGNIFICANCE OF ISOLATING THIS ORGANISM FROM A SINGLE SET OF BLOOD CULTURES WHEN MULTIPLE SETS ARE DRAWN IS UNCERTAIN. PLEASE NOTIFY THE MICROBIOLOGY DEPARTMENT WITHIN ONE WEEK IF SPECIATION AND SENSITIVITIES ARE REQUIRED. Performed at [facility], [place] [address] [place], [place] [address]    Report Status 06/03/2020 FINAL  Final  Blood Culture ID Panel (Reflexed)     Status: Abnormal   Collection Time: 06/01/20  5:37 AM  Result Value Ref Range Status   Enterococcus species NOT DETECTED NOT DETECTED Final   Listeria monocytogenes NOT DETECTED NOT DETECTED Final   Staphylococcus species NOT DETECTED NOT DETECTED  Final   Staphylococcus aureus (BCID) NOT DETECTED NOT DETECTED Final   Streptococcus species DETECTED (A) NOT DETECTED Final    Comment: Not Enterococcus species, Streptococcus agalactiae, Streptococcus pyogenes, or Streptococcus pneumoniae. CRITICAL RESULT  CALLED TO, READ BACK BY AND VERIFIED WITH: G ABBOTT PHARMD 06/02/20 0616 JDW    Streptococcus agalactiae NOT DETECTED NOT DETECTED Final   Streptococcus pneumoniae NOT DETECTED NOT DETECTED Final   Streptococcus pyogenes NOT DETECTED NOT DETECTED Final   Acinetobacter baumannii NOT DETECTED NOT DETECTED Final   Enterobacteriaceae species NOT DETECTED NOT DETECTED Final   Enterobacter cloacae complex NOT DETECTED NOT DETECTED Final   Escherichia coli NOT DETECTED NOT DETECTED Final   Klebsiella oxytoca NOT DETECTED NOT DETECTED Final   Klebsiella pneumoniae NOT DETECTED NOT DETECTED Final   Proteus species NOT DETECTED NOT DETECTED Final   Serratia marcescens NOT DETECTED NOT DETECTED Final   Haemophilus influenzae NOT DETECTED NOT DETECTED Final   Neisseria meningitidis NOT DETECTED NOT DETECTED Final   Pseudomonas aeruginosa NOT DETECTED NOT DETECTED Final   Candida albicans NOT DETECTED NOT DETECTED Final   Candida glabrata NOT DETECTED NOT DETECTED Final   Candida krusei NOT DETECTED NOT DETECTED Final   Candida parapsilosis NOT DETECTED NOT DETECTED Final   Candida tropicalis NOT DETECTED NOT DETECTED Final    Comment: Performed at [facility], [place] [address] [place], [place] [address]  Urine Culture     Status: Abnormal   Collection Time: 06/01/20  8:57 AM   Specimen: Urine, Random  Result Value Ref Range Status   Specimen Description URINE, RANDOM  Final   Special Requests Normal  Final   Culture (A)  Final    <10,000 COLONIES/mL INSIGNIFICANT GROWTH Performed at [facility], [place] [address] [place], [place] [address]    Report Status 06/02/2020 FINAL  Final  Culture, blood (Routine X 2) w Reflex to ID  Panel     Status: None   Collection Time: 06/02/20 10:59 AM   Specimen: BLOOD  Result Value Ref Range Status   Specimen Description BLOOD LEFT ANTECUBITAL  Final   Special Requests   Final    BOTTLES DRAWN AEROBIC ONLY Blood Culture results may not be optimal due to an inadequate volume of blood received in culture bottles   Culture   Final    NO GROWTH 5 DAYS Performed at [facility], [place] [address] [place], [place] [address]    Report Status 06/07/2020 FINAL  Final  Culture, blood (Routine X 2) w Reflex to ID Panel     Status: Abnormal   Collection Time: 06/02/20 10:59 AM   Specimen: BLOOD  Result Value Ref Range Status   Specimen Description BLOOD RIGHT ANTECUBITAL  Final   Special Requests   Final    BOTTLES DRAWN AEROBIC ONLY Blood Culture adequate volume   Culture  Setup Time   Final    GRAM POSITIVE COCCI AEROBIC BOTTLE ONLY CRITICAL VALUE NOTED.  VALUE IS CONSISTENT WITH PREVIOUSLY REPORTED AND CALLED VALUE. Performed at [facility], [place] [address], [place],  [address]    Culture STAPHYLOCOCCUS EPIDERMIDIS (A)  Final   Report Status 06/07/2020 FINAL  Final  Culture, blood (routine x 2)     Status: None (Preliminary result)   Collection Time: 06/05/20  7:34 PM   Specimen: BLOOD  Result Value Ref Range Status   Specimen Description BLOOD LEFT ANTECUBITAL  Final   Special Requests   Final    BOTTLES DRAWN AEROBIC AND ANAEROBIC Blood Culture adequate volume   Culture   Final    NO GROWTH 4 DAYS Performed at [facility], [place] [address], [place], [place]  [address]    Report Status PENDING  Incomplete  Culture, blood (routine x 2)     Status: None (Preliminary result)   Collection Time: 06/05/20  7:40 PM   Specimen: BLOOD LEFT HAND  Result Value Ref Range Status   Specimen Description BLOOD LEFT HAND  Final   Special Requests   Final    BOTTLES DRAWN AEROBIC ONLY Blood Culture adequate volume   Culture   Final    NO GROWTH 4 DAYS Performed  at [facility], [place] [address], [place], [place] [address]    Report Status PENDING  Incomplete    Radiology Studies: No results found.    Yanissa Michalsky T. Wamsutter  If 7PM-7AM, please contact night-coverage [email or web address] Password [facility] 06/09/2020, 4:18 PM

## 2020-06-09 NOTE — Progress Notes (Signed)
HEMATOLOGY-ONCOLOGY PROGRESS NOTE  SUBJECTIVE: He is alert and sitting on the side of the bed this morning.  No pain or nausea.  PHYSICAL EXAMINATION:  Vitals:   06/08/20 1950 06/09/20 0759  BP: 96/69 (!) 136/93  Pulse: 100 (!) 106  Resp: 14 16  Temp: 97.9 F (36.6 C) 98.7 F (37.1 C)  SpO2: 95% 98%   Filed Weights   05/30/20 0500 05/31/20 0500 06/01/20 0254  Weight: 149 lb (67.6 kg) 140 lb (63.5 kg) 147 lb (66.7 kg)    Intake/Output from previous day: 06/24 0701 - 06/25 0700 In: -  Out: 400 [Urine:400]  GENERAL:alert, no distress and comfortable HEENT: No pain with neck motion ABDOMEN:abdomen soft and nontender NEURO: Oriented to place, remains confused.  Says his family visited this morning  LABORATORY DATA:  I have reviewed the data as listed CMP Latest Ref Rng & Units 06/06/2020 06/05/2020 06/04/2020  Glucose 70 - 99 mg/dL 92 98 122(H)  BUN 8 - 23 mg/dL 6(L) 7(L) 8  Creatinine 0.61 - 1.24 mg/dL 0.76 0.86 0.98  Sodium 135 - 145 mmol/L 143 141 140  Potassium 3.5 - 5.1 mmol/L 3.8 4.6 3.4(L)  Chloride 98 - 111 mmol/L 108 108 106  CO2 22 - 32 mmol/L 25 24 24   Calcium 8.9 - 10.3 mg/dL 8.4(L) 8.2(L) 8.4(L)  Total Protein 6.5 - 8.1 g/dL - 5.3(L) -  Total Bilirubin 0.3 - 1.2 mg/dL - 0.8 -  Alkaline Phos 38 - 126 U/L - 86 -  AST 15 - 41 U/L - 18 -  ALT 0 - 44 U/L - 10 -    Lab Results  Component Value Date   WBC 15.3 (H) 06/08/2020   HGB 7.2 (L) 06/08/2020   HCT 23.1 (L) 06/08/2020   MCV 87.2 06/08/2020   PLT 446 (H) 06/08/2020   NEUTROABS 12.3 (H) 06/08/2020    CT ABDOMEN PELVIS W CONTRAST  Result Date: 06/04/2020 CLINICAL DATA:  Abdominal pain. Fever of unknown origin. Upper GI bleed secondary to large malignant gastric ulcer with acute blood loss anemia. Bloody stool last night. EXAM: CT ABDOMEN AND PELVIS WITH CONTRAST TECHNIQUE: Multidetector CT imaging of the abdomen and pelvis was performed using the standard protocol following bolus administration of  intravenous contrast. CONTRAST:  169m OMNIPAQUE IOHEXOL 300 MG/ML  SOLN COMPARISON:  Abdominal CT 05/01/2020 FINDINGS: Lower chest: Small right pleural effusion which has slightly increased from CT last month. Trace left pleural effusion which is new. Scattered basilar atelectasis. Heart is normal in size. Hepatobiliary: Increased size of dominant left lobe liver lesion since last month, currently 8 cm, previously 5.4 cm. Subcapsular right lobe lesion is also increased in size currently 4.5 cm, previously 2.4 cm. Smaller subcapsular lesion in the right lobe is also slightly increased in size. There are new lesions in the central right lobe, series 3, image 11, and possibly left lobe series 3, image 26. Gallbladder physiologically distended. No biliary dilatation. Pancreas: No ductal dilatation or inflammation. Spleen: Normal in size without focal abnormality. Adrenals/Urinary Tract: Normal adrenal glands. No hydronephrosis or perinephric edema. Homogeneous renal enhancement with symmetric excretion on delayed phase imaging. Left renal cyst unchanged from prior exam. Urinary bladder is partially distended without wall thickening. Stomach/Bowel: The stomach is nondistended. The known gastric mass is difficult to delineate by CT, but roughly in the region of series 3, image 24. There is no obvious gastric wall thickening. No small bowel obstruction. Administered enteric contrast reaches the colon. No small bowel inflammation. Moderate colonic  stool burden without colonic wall thickening. There is sigmoid colonic tortuosity. Vascular/Lymphatic: Infrarenal IVC filter in place. Aortic atherosclerosis without aneurysm. The portal vein is patent. Embolization coils in the upper abdomen. No bulky abdominopelvic adenopathy. Reproductive: Prostate is unremarkable. Other: Trace loculated subcapsular fluid adjacent to the left lobe of the liver. No other ascites. Trace presacral edema. Generalized body wall edema in the flanks.  No free intra-abdominal air or abscess. Musculoskeletal: Mild chronic L2 superior endplate compression fracture, unchanged. No acute osseous abnormality or focal bone lesion. IMPRESSION: 1. Increased size of hepatic metastasis since CT last month. There is also a new small liver lesion. 2. The known gastric mass is difficult to delineate on the current exam given nondistended stomach. 3. Small right and trace left pleural effusions, slightly increased from CT last month. 4. Moderate colonic stool burden with sigmoid colonic tortuosity, can be seen with constipation. No bowel obstruction or inflammation. Aortic Atherosclerosis (ICD10-I70.0). Electronically Signed   By: Keith Rake M.D.   On: 06/04/2020 00:50   DG CHEST PORT 1 VIEW  Result Date: 06/01/2020 CLINICAL DATA:  History of previous pulmonary embolism with right ventricular strain, cough and fevers EXAM: PORTABLE CHEST 1 VIEW COMPARISON:  04/04/2020 FINDINGS: Cardiac shadow is stable. Tracheostomy tube and feeding catheter has been removed in the interval. Lungs are well aerated bilaterally. No focal infiltrate or sizable effusion is seen. Focal nodular density is again noted in the left upper lobe consistent with dilated pulmonary arterial branch. IMPRESSION: No acute abnormality noted. Electronically Signed   By: Inez Catalina M.D.   On: 06/01/2020 10:52    ASSESSMENT AND PLAN: 1.    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              -CT abdomen/pelvis 06/03/2020-increased size of hepatic metastases, new liver lesions 2.  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  Tracheostomy placement on 03/20/2020, now healed 4.  Cardiac arrest on 03/07/2020 5.  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  History of acute blood loss anemia likely due to upper GI bleed 7.  Delusions/hallucinations 8.  Fever/bacteremia 05/31/2020  9.  Anemia secondary to chronic disease, malnutrition, infection, and GI bleeding  [name] appears unchanged today.  The fever has resolved.  He continues to have confusion.  I see no reference in the chart to his family visiting.  I do not feel he is a candidate for chemotherapy.  I discussed the case yesterday with Dr. Cyndia Skeeters.  We will request a palliative care evaluation to include an ethics discussion.   Recommendations: 1.  Continue antibiotics and medical therapy per the hospitalist service 2.  Await disposition plan 3.  Reconsult palliative care for ethics discussion 4.  Please call oncology as needed.  I will check on him 06/12/2020.   LOS: 65 days    Betsy Coder, MD 06/09/20

## 2020-06-09 NOTE — Plan of Care (Signed)
°  Problem: Role Relationship: Goal: Method of communication will improve Outcome: Progressing   Problem: Education: Goal: Knowledge of General Education information will improve Description: Including pain rating scale, medication(s)/side effects and non-pharmacologic comfort measures Outcome: Progressing   Problem: Health Behavior/Discharge Planning: Goal: Ability to manage health-related needs will improve Outcome: Progressing   Problem: Nutrition: Goal: Adequate nutrition will be maintained Outcome: Progressing   Problem: Coping: Goal: Level of anxiety will decrease Outcome: Progressing   Problem: Safety: Goal: Ability to remain free from injury will improve Outcome: Progressing   Problem: Education: Goal: Knowledge of disease or condition will improve Outcome: Progressing Goal: Knowledge of secondary prevention will improve Outcome: Progressing Goal: Knowledge of patient specific risk factors addressed and post discharge goals established will improve Outcome: Progressing Goal: Individualized Educational Video(s) Outcome: Progressing   Problem: Coping: Goal: Will verbalize positive feelings about self Outcome: Progressing Goal: Will identify appropriate support needs Outcome: Progressing   Problem: Health Behavior/Discharge Planning: Goal: Ability to manage health-related needs will improve Outcome: Progressing   Problem: Self-Care: Goal: Ability to participate in self-care as condition permits will improve Outcome: Progressing Goal: Verbalization of feelings and concerns over difficulty with self-care will improve Outcome: Progressing Goal: Ability to communicate needs accurately will improve Outcome: Progressing   Problem: Nutrition: Goal: Risk of aspiration will decrease Outcome: Progressing Goal: Dietary intake will improve Outcome: Progressing   Problem: Intracerebral Hemorrhage Tissue Perfusion: Goal: Complications of Intracerebral Hemorrhage will  be minimized Outcome: Progressing   Problem: Ischemic Stroke/TIA Tissue Perfusion: Goal: Complications of ischemic stroke/TIA will be minimized Outcome: Progressing   Problem: Spontaneous Subarachnoid Hemorrhage Tissue Perfusion: Goal: Complications of Spontaneous Subarachnoid Hemorrhage will be minimized Outcome: Progressing   Problem: Education: Goal: Knowledge of disease or condition will improve Outcome: Progressing Goal: Knowledge of secondary prevention will improve Outcome: Progressing Goal: Knowledge of patient specific risk factors addressed and post discharge goals established will improve Outcome: Progressing   Problem: Self-Care: Goal: Ability to participate in self-care as condition permits will improve Outcome: Progressing Goal: Verbalization of feelings and concerns over difficulty with self-care will improve Outcome: Progressing Goal: Ability to communicate needs accurately will improve Outcome: Progressing

## 2020-06-09 NOTE — TOC Progression Note (Signed)
Transition of Care [facility]) - Progression Note    Patient Details  Name: Kenneth Sullivan MRN: [id number] Date of Birth: [birth year]  [facility] CM/SW [place], RN Phone Number: [phone]  06/09/2020, 2:28 PM  Clinical Narrative:    Patient continues to be on IV antibiotics. Currently not candidate for Chemo per Dr. Carin Hock note. Palliative care consult pending. CM will continue to follow.    Expected Discharge Plan: Skilled Nursing Facility Barriers to Discharge: No SNF bed  Expected Discharge Plan and Services Expected Discharge Plan: [place] In-house Referral: Clinical Social Work     Living arrangements for the past 2 months: Apartment                                       Social Determinants of Health (SDOH) Interventions    Readmission Risk Interventions No flowsheet data found.

## 2020-06-09 NOTE — Progress Notes (Signed)
Pharmacy note - antibiotics  [name] is on day #9 of antibiotics and day #8 of Unasyn.  Asked Dr. Cyndia Skeeters about length of therapy and recommended 10 days.  Dr. Cyndia Skeeters agreed - still stop Unasyn after tomorrow.  Heide Guile, PharmD, BCPS-AQ ID Clinical Pharmacist

## 2020-06-10 LAB — CULTURE, BLOOD (ROUTINE X 2)
Culture: NO GROWTH
Culture: NO GROWTH
Special Requests: ADEQUATE
Special Requests: ADEQUATE

## 2020-06-10 NOTE — Care Management (Signed)
Attempted to reach Kenneth Sullivan, at request of Cecil Cranker to provide home hospice in [place]. No answer or ability to leave voicemail. Fitch,Gloria Niece [phone]  [phone]

## 2020-06-10 NOTE — Consult Note (Signed)
Palliative Medicine Inpatient Consult Note  Reason for consult:  Goal of Care "Goal of care. Patient with metastatic adenocarcinoma and complex medical and social barriers for chemotherapy"  HPI:  Per Hospitalist Note --> 64 year old with no prior medical history who had acute shortness of breath respiratory failure oxygen saturation in the 40s on EMS arrival. He had a witnessed bradycardic, asystole arrest when EMS was present with 3 rounds of epi, 20 minutes of CPR prior to [place]. Underwent normothermia protocol. Work-up was significant for massive PE, DVT with right ventricular strain. Unfortunately hospital course was complicated by upper GI bleed due to gastric ulcer. A CT abdomen pelvis showed a large antral mass and multiple liver lesions biopsy confirmed a high-grade neuroendocrine tumor, subsequent EGD with biopsy revealing adenocarcinoma.   Palliative care was re-consulted in the setting of declining health state to verify goals moving forward. 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 Oncology has seen and evaluated this patient, he is not a candidate for chemotherapy.  Clinical Assessment/Goals of Care: I have reviewed medical records including EPIC notes, labs and imaging, received report from bedside RN, assessed the patient.   In brief Kenneth Sullivan has been hospitalized for the past ninety five days. He has ongoing anoxic encephalopathy in the setting of prior cardiac arrest. He has newly identified metastatic disease. He has been septic during hospitalization due to streptococcus bacteremia.Kenneth Sullivan has not been improving rather he has declined from a functional and nutrition perspective. Kenneth Sullivan at this juncture is more than appropriate for hospice as he has likely only months left to live.    I called Kenneth Sullivan (niece) to further discuss diagnosis prognosis, GOC, EOL wishes,  disposition and options.   I introduced Palliative Medicine as specialized medical care for people living with serious illness. It focuses on providing relief from the symptoms and stress of a serious illness. The goal is to improve quality of life for both the patient and the family.  Social history review per prior --> He was born and raised in [place]. During his time there he worked at [employer]. He has been married prior and has two children who he is estranged from. He moved to [place], [place] in 2011 to live with his niece, Kenneth Sullivan due to being homeless. He was with her for sometime then ended up in [place] living at a rooming house. He has a girlfriend, Kenneth Sullivan who he has been with since 2017. Since being in [place] he has partaken in "odd jobs". In regard to things he enjoys, Kenneth Sullivan loves musical artists like Financial risk analyst, Christie [place]. He enjoyed watching crime shows like [employer] and Proofreader.   A detailed discussion was had today regarding advanced directives, there are non on file though Harolds niece has remained to make decisions for him during his stay.    Concepts specific to code status, artifical feeding and hydration, continued IV antibiotics and rehospitalization was had.  Kenneth Sullivan id a DNAR/DNI code status.   The difference between a aggressive medical intervention path  and a palliative comfort care path for this patient at this time was had.  I shared with Kenneth Sullivan that it is time to allow his body to progress naturally onto the next chapter. Kenneth Sullivan stated that she knows he has limited time left and would like to take him home. She is a CNA and hopes to take him home with hospice.  She would like to speak with the local hospice agencies to gain insight on the services they offer. She lives in [place] therefore we talked about case management reaching out to the agencies there to schedule a phone meeting with her.  I shared with Kenneth Sullivan  that it would be better for Kenneth Sullivan to be in a familiar environment surrounded by people who care about him as opposed to remaining hospitalized when we really aren't able to offer much at this point in time.   Kenneth Sullivan is in agreement with this plan. She and I discussed the differences between home with hospice and residential hospice.   We reviewed all of Harolds medical conditions in detail. We talked at length about his adenocarcinoma and the reality that chemo would be very hard on his frail body even if it were an option. I shared with Kenneth Sullivan that Kenneth Sullivan has very limited time left and we need to respect the wishes that he had which were to not be in the hospital setting as he reaches his final days.   Discussed the importance of continued conversation with family and their  medical providers regarding overall plan of care and treatment options, ensuring decisions are within the context of the patients values and GOCs.  Decision Maker: Kenneth Sullivan (Niece) [phone]  SUMMARY OF RECOMMENDATIONS   DNAR/DNI  TOC --> Reach out to home hospice agencies in [place] to arrange a meeting with patients niece who would like to take the patient home with hospice. She wants to verify they will cover all DME's and identify the services they can offer  Ongoing PMT discussions  Code Status/Advance Care Planning: DNAR/DNI   Palliative Prophylaxis:   Oral Care, Turn Q2H, Pain management, Delirium precautions  Additional Recommendations (Limitations, Scope, Preferences):  Treat what is treatable   Psycho-social/Spiritual:   Desire for further Chaplaincy support: No  Additional Recommendations: Education on the process of dying and hospice care   Prognosis: Extremely poor likely months   Discharge Planning: Discharge ideally to nieces home with hospice care   PPS: 20%   This conversation/these recommendations were discussed with patient primary care team, Dr. Cyndia Skeeters  Time In: 1800 Time  Out: 1910 Total Time: 42 Greater than 50%  of this time was spent counseling and coordinating care related to the above assessment and plan.  [facility] Team Team Cell Phone: [phone] Please utilize secure chat with additional questions, if there is no response within 30 minutes please call the above phone number  Palliative Medicine Team providers are available by phone from 7am to 7pm daily and can be reached through the team cell phone.  Should this patient require assistance outside of these hours, please call the patient's attending physician.

## 2020-06-10 NOTE — Progress Notes (Signed)
PROGRESS NOTE  Cuthbert Turton JQB:[id number] DOB: October 09, 1956   PCP: Medicine, [name]  Patient is from: home  DOA: 03/07/2020 LOS: 95  Brief Narrative / Interim history: 64 year old with no prior medical history who had acute shortness of breath respiratory failure oxygen saturation in the 40s on EMS arrival.  He had a witnessed bradycardic, asystole arrest when EMS was present with 3 rounds of epi, 20 minutes of CPR prior to [place]. Underwent normothermia protocol.  Work-up was significant for massive PE, DVT with right ventricular strain.  Unfortunately hospital course was complicated by upper GI bleed due to gastric ulcer.  A CT abdomen pelvis showed a large antral mass and multiple liver lesions biopsy confirmed a high-grade neuroendocrine tumor, subsequent EGD with biopsy revealing adenocarcinoma.  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   For his massive PE patient underwent IR guided mechanical thrombectomy and IVC filter.  Post procedure, while on heparin drip patient is started having maroon-colored red stool and bloody drainage from OGT.  GI did endoscopy which showed oozing gastric ulcer with clots.  IR did GDA embolization.  Patient remained encephalopathic. On 3/27, MRI of the brain showed multifocal acute to subacute ischemic infarct involving bilateral cerebral hemisphere consistent with global hypoperfusion related to cardiac arrest, also associated with scatter petechial hemorrhage with evidence of hemorrhagic conversion in the parietal lobe. CT abdomen and pelvis showed gastric lesion in the antrum concerning for antral mass and multiple liver lesion concerning for metastatic disease IR consulted and he underwent liver biopsy on 5/19.  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  Patient developed fever, leukocytosis on June 17.  Blood culture on 6/17 with Streptococcus Constellatus  (but one sample).  Repeat blood culture on 6/18 with staph epidermis in 1 out of 2 bottles 1 out of 2 growing staph coagulase negative likely contaminant.    Blood culture on 6/21 NGTD. Patient was started on Unasyn.  CT abdomen pelvis negative for abscess, but showed worsening liver metastatic diseases.  Oncology, Dr Benay Spice discussed case with patient's niece, [name] over the phone, and recommended the family to establish HCPOA and hospice unless his fever resolves and he returns today eating/ambulating to be considered for FOLFOX.  This is also complicated by complex medical and social issues   Subjective: Seen and examined earlier this morning.  No major events overnight of this morning.  Continues to have intermittent hallucination but no distress or agitation.  No behavioral disturbance.  Objective: Vitals:   06/09/20 1543 06/09/20 1936 06/09/20 1955 06/10/20 0907  BP: 119/73 110/77  104/72  Pulse: (!) 106 (!) 112 (!) 105 (!) 102  Resp: 16 14  18   Temp: 97.9 F (36.6 C) 99.2 F (37.3 C)  99 F (37.2 C)  TempSrc:  Oral    SpO2: 91% 97%  100%  Weight:      Height:        Intake/Output Summary (Last 24 hours) at 06/10/2020 1605 Last data filed at 06/09/2020 2119 Gross per 24 hour  Intake 120 ml  Output --  Net 120 ml   Filed Weights   05/30/20 0500 05/31/20 0500 06/01/20 0254  Weight: 67.6 kg 63.5 kg 66.7 kg    Examination:  GENERAL: No apparent distress.  Nontoxic. HEENT: MMM.  Vision and hearing grossly intact.  NECK: Supple.  No apparent JVD.  RESP: On room air.  No IWOB.  Fair aeration bilaterally. CVS:  RRR.  Heart sounds normal.  ABD/GI/GU: BS+. Abd soft, NTND.  MSK/EXT:  Moves extremities. No apparent deformity. No edema.  SKIN: no apparent skin lesion or wound NEURO: Awake, alert and oriented to self and place.  No apparent focal neuro deficit. PSYCH: Calm but seems to have hallucination.  Significant events: 3/23>> Admit, EGD with findings of oozing  gastric ulcer with clot, IR for mechanical thrombectomy, IVC filter and GDA embolization 4/5>>Trach 4/28>>trach decannulated  Significant studies: CTA 3/23 >> bilateral PE with RV/with ratio 1.9, emphysema CT head 3/23 >>chronic microvascular changes. No acute findings Echo 3/23 >> RV is severely dilated with reduced function and D-shaped septum, LVEF 60-65%. Venogram 3/23>> occlusive DVT of the iliac vein extending to the lower IVC LE Venous Duplex 3/24 >>acute DVT in BLE up to the femoral vein  MRI Brain 3/28 >> multifocal acute to subacute ischemic infarcts involving bilateral cerebral hemispheres, findings consistent with global hypoperfusion event related to cardiac arrest, associated scattered petechial hemorrhage about many areas of ischemia, evidence of hemorrhagic conversion in the right parietal lobe  CT abdomen and pelvis on 6/19 with increased size of hepatic metastasis with new small liver lesions  Assessment & Plan: Acute hypoxic respiratory failure in the setting of massive PE/cardiac arrest: Resolved Managed in the ICU with status post tracheostomy subsequently decannulated.    Cardiac arrest with anoxic brain injury due to massive PE Massive PE with DVT status post IVC filter: -Status post IVC filter placement on mechanical thrombectomy. 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  His sepsis physiology could also be due to malignancy. Repeat blood culture on 6/21 NGTD. -IV Unasyn for 10 days (through 06/10/2020), then observe off antibiotics.   Normocytic anemia: H&H stable. -Continue monitoring  Dysphagia:  Resolved.  On regular diet.  Delusion/hallucination:  -Psychologist, prison and probation services input appreciated. -Continue with Seroquel   Constipation  -schedule BR  Hypokalemia -Replenish and recheck  Leukocytosis/bandemia-due to bacteremia. Could be due to malignancy. -Check CBC intermittently  Debility/physical deconditioning -Continue PT/OT  Goal of care/DNR/DNI-patient is gradually deconditioning.  CT also showing increased liver mets with new lesions.  Oncology suggesting hospice given his current situation. -Needs further goal of care discussion-we will reconsult palliative care  Ethical issue-patient with new diagnosis of adenocarcinoma with metastasis to liver.  There is a question of treatment with chemo but multiple complicating factors medically and socially. 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 Although there is some improvement in his mental status, he is still confused with very poor insight into his situation.  No close family member other than niece, no place to stay, no reliable transportation... -Reconsulted palliative care for goal of care and ethical issues.  Severe malnutrition: Etiology chronic illness gastric lesion, multiple evaluation.  Continue with Ensure Body mass index is 18.87 kg/m. Nutrition Problem: Severe Malnutrition Etiology: chronic illness (gastric lesion, multiple liver lesions, concern for metastatic disease) Signs/Symptoms: severe fat depletion, severe muscle depletion Interventions: Ensure Enlive (each supplement provides 350kcal  and 20 grams of protein), Magic cup   DVT prophylaxis:  SCDs Start: 03/07/20 0835  Code Status: DNR/DNI Family Communication: Patient and/or RN. Available  if any question. Status is: Inpatient  Remains inpatient appropriate because:Unsafe d/c plan   Dispo: The patient is from: Home              Anticipated d/c is to: Likely to niece home with home hospice.              Anticipated d/c date is: 2 days              Patient currently is medically stable to d/c.       Consultants:  Oncology Palliative medicine   Sch Meds:  Scheduled Meds: . clonazePAM  0.5 mg Oral BID  . feeding supplement (ENSURE ENLIVE)  237 mL Oral QID  . ferrous sulfate  325 mg Oral Q breakfast  . FLUoxetine  10 mg Oral QHS  . OXcarbazepine  75 mg Oral BID  . pantoprazole  40 mg Oral BID  . polyethylene glycol  17 g Oral BID  . QUEtiapine  100 mg Oral QHS  . QUEtiapine  75 mg Oral Daily  . senna-docusate  1 tablet Oral BID   Continuous Infusions: . sodium chloride    . sodium chloride 50 mL/hr at 06/05/20 1606  . ampicillin-sulbactam (UNASYN) IV 3 g (06/10/20 1539)   PRN Meds:.sodium chloride, acetaminophen, docusate sodium, guaiFENesin-dextromethorphan, hydrALAZINE, ipratropium-albuterol, LORazepam, oxyCODONE, sodium chloride flush  Antimicrobials: Anti-infectives (From admission, onward)   Start     Dose/Rate Route Frequency Ordered Stop   06/02/20 1630  Ampicillin-Sulbactam (UNASYN) 3 g in sodium chloride 0.9 % 100 mL IVPB     Discontinue     3 g 200 mL/hr over 30 Minutes Intravenous Every 6 hours 06/02/20 1626 06/10/20 2359   06/02/20 0645  cefTRIAXone (ROCEPHIN) 2 g in sodium chloride 0.9 % 100 mL IVPB  Status:  Discontinued        2 g 200 mL/hr over 30 Minutes Intravenous Daily 06/02/20 0622 06/02/20 1607   06/01/20 0715  cefTRIAXone (ROCEPHIN) 1 g in sodium chloride 0.9 % 100 mL IVPB  Status:  Discontinued        1 g 200 mL/hr over 30 Minutes Intravenous Every 24 hours 06/01/20 0710 06/02/20 0622   03/26/20 1100  doxycycline (VIBRAMYCIN) 100 mg in sodium chloride 0.9 % 250 mL IVPB  Status:  Discontinued        100 mg 125 mL/hr over 120  Minutes Intravenous 2 times daily 03/26/20 1002 04/01/20 1035   03/25/20 1100  doxycycline (VIBRA-TABS) tablet 100 mg  Status:  Discontinued        100 mg Per Tube Every 12 hours 03/25/20 1009 03/26/20 1002   03/25/20 1015  doxycycline (VIBRAMYCIN) 100 mg in sodium chloride 0.9 % 250 mL IVPB  Status:  Discontinued       Note to Pharmacy: To be given after the tracheal aspirate is collected.   100 mg 125 mL/hr over 120 Minutes Intravenous Every 12 hours 03/25/20 1005 03/25/20 1008       I have personally reviewed the following labs and images: CBC: Recent Labs  Lab 06/04/20 1058 06/05/20 0039 06/06/20 0347 06/08/20 0327 06/09/20 0751  WBC 13.2* 13.4* 14.1* 15.3* 16.7*  NEUTROABS  --   --   --  12.3* 13.7*  HGB 7.8* 7.6* 8.2* 7.2* 8.1*  HCT 24.6* 23.8* 26.1* 23.1* 26.5*  MCV 86.6 87.5  87.9 87.2 88.6  PLT 299 313 362 446* 604*   BMP &GFR Recent Labs  Lab 06/04/20 1058 06/05/20 0039 06/06/20 0347  NA 140 141 143  K 3.4* 4.6 3.8  CL 106 108 108  CO2 24 24 25   GLUCOSE 122* 98 92  BUN 8 7* 6*  CREATININE 0.98 0.86 0.76  CALCIUM 8.4* 8.2* 8.4*  MG  --  1.9  --    Estimated Creatinine Clearance: 90.3 mL/min (by C-G formula based on SCr of 0.76 mg/dL). Liver & Pancreas: Recent Labs  Lab 06/05/20 0039  AST 18  ALT 10  ALKPHOS 86  BILITOT 0.8  PROT 5.3*  ALBUMIN 1.9*   No results for input(s): LIPASE, AMYLASE in the last 168 hours. Recent Labs  Lab 06/04/20 1058  AMMONIA 18   Diabetic: No results for input(s): HGBA1C in the last 72 hours. No results for input(s): GLUCAP in the last 168 hours. Cardiac Enzymes: No results for input(s): CKTOTAL, CKMB, CKMBINDEX, TROPONINI in the last 168 hours. No results for input(s): PROBNP in the last 8760 hours. Coagulation Profile: No results for input(s): INR, PROTIME in the last 168 hours. Thyroid Function Tests: No results for input(s): TSH, T4TOTAL, FREET4, T3FREE, THYROIDAB in the last 72 hours. Lipid Profile: No  results for input(s): CHOL, HDL, LDLCALC, TRIG, CHOLHDL, LDLDIRECT in the last 72 hours. Anemia Panel: No results for input(s): VITAMINB12, FOLATE, FERRITIN, TIBC, IRON, RETICCTPCT in the last 72 hours. Urine analysis:    Component Value Date/Time   COLORURINE AMBER (A) 06/01/2020 0506   APPEARANCEUR HAZY (A) 06/01/2020 0506   LABSPEC 1.025 06/01/2020 0506   PHURINE 5.0 06/01/2020 0506   GLUCOSEU NEGATIVE 06/01/2020 0506   HGBUR NEGATIVE 06/01/2020 0506   BILIRUBINUR NEGATIVE 06/01/2020 0506   KETONESUR NEGATIVE 06/01/2020 0506   PROTEINUR NEGATIVE 06/01/2020 0506   NITRITE NEGATIVE 06/01/2020 0506   LEUKOCYTESUR LARGE (A) 06/01/2020 0506   Sepsis Labs: Invalid input(s): PROCALCITONIN, [place]  Microbiology: Recent Results (from the past 240 hour(s))  Culture, blood (single)     Status: Abnormal   Collection Time: 06/01/20  5:37 AM   Specimen: BLOOD LEFT HAND  Result Value Ref Range Status   Specimen Description BLOOD LEFT HAND  Final   Special Requests   Final    BOTTLES DRAWN AEROBIC AND ANAEROBIC Blood Culture adequate volume   Culture  Setup Time   Final    IN BOTH AEROBIC AND ANAEROBIC BOTTLES GRAM POSITIVE COCCI IN CHAINS CRITICAL RESULT CALLED TO, READ BACK BY AND VERIFIED WITH: G ABBOTT PHARMD 06/02/20 0616 JDW    Culture (A)  Final    STREPTOCOCCUS CONSTELLATUS THE SIGNIFICANCE OF ISOLATING THIS ORGANISM FROM A SINGLE SET OF BLOOD CULTURES WHEN MULTIPLE SETS ARE DRAWN IS UNCERTAIN. PLEASE NOTIFY THE MICROBIOLOGY DEPARTMENT WITHIN ONE WEEK IF SPECIATION AND SENSITIVITIES ARE REQUIRED. Performed at [facility], [place] [address] [place], [place] [address]    Report Status 06/03/2020 FINAL  Final  Blood Culture ID Panel (Reflexed)     Status: Abnormal   Collection Time: 06/01/20  5:37 AM  Result Value Ref Range Status   Enterococcus species NOT DETECTED NOT DETECTED Final   Listeria monocytogenes NOT DETECTED NOT DETECTED Final   Staphylococcus species NOT  DETECTED NOT DETECTED Final   Staphylococcus aureus (BCID) NOT DETECTED NOT DETECTED Final   Streptococcus species DETECTED (A) NOT DETECTED Final    Comment: Not Enterococcus species, Streptococcus agalactiae, Streptococcus pyogenes, or Streptococcus pneumoniae. CRITICAL RESULT CALLED  TO, READ BACK BY AND VERIFIED WITH: G ABBOTT PHARMD 06/02/20 0616 JDW    Streptococcus agalactiae NOT DETECTED NOT DETECTED Final   Streptococcus pneumoniae NOT DETECTED NOT DETECTED Final   Streptococcus pyogenes NOT DETECTED NOT DETECTED Final   Acinetobacter baumannii NOT DETECTED NOT DETECTED Final   Enterobacteriaceae species NOT DETECTED NOT DETECTED Final   Enterobacter cloacae complex NOT DETECTED NOT DETECTED Final   Escherichia coli NOT DETECTED NOT DETECTED Final   Klebsiella oxytoca NOT DETECTED NOT DETECTED Final   Klebsiella pneumoniae NOT DETECTED NOT DETECTED Final   Proteus species NOT DETECTED NOT DETECTED Final   Serratia marcescens NOT DETECTED NOT DETECTED Final   Haemophilus influenzae NOT DETECTED NOT DETECTED Final   Neisseria meningitidis NOT DETECTED NOT DETECTED Final   Pseudomonas aeruginosa NOT DETECTED NOT DETECTED Final   Candida albicans NOT DETECTED NOT DETECTED Final   Candida glabrata NOT DETECTED NOT DETECTED Final   Candida krusei NOT DETECTED NOT DETECTED Final   Candida parapsilosis NOT DETECTED NOT DETECTED Final   Candida tropicalis NOT DETECTED NOT DETECTED Final    Comment: Performed at [facility], [place] [address] [place], [place] [address]  Urine Culture     Status: Abnormal   Collection Time: 06/01/20  8:57 AM   Specimen: Urine, Random  Result Value Ref Range Status   Specimen Description URINE, RANDOM  Final   Special Requests Normal  Final   Culture (A)  Final    <10,000 COLONIES/mL INSIGNIFICANT GROWTH Performed at [facility], [place] [address] [place], [place] [address]    Report Status 06/02/2020 FINAL  Final  Culture, blood (Routine X  2) w Reflex to ID Panel     Status: None   Collection Time: 06/02/20 10:59 AM   Specimen: BLOOD  Result Value Ref Range Status   Specimen Description BLOOD LEFT ANTECUBITAL  Final   Special Requests   Final    BOTTLES DRAWN AEROBIC ONLY Blood Culture results may not be optimal due to an inadequate volume of blood received in culture bottles   Culture   Final    NO GROWTH 5 DAYS Performed at [facility], [place] [address], [place], [place] [address]    Report Status 06/07/2020 FINAL  Final  Culture, blood (Routine X 2) w Reflex to ID Panel     Status: Abnormal   Collection Time: 06/02/20 10:59 AM   Specimen: BLOOD  Result Value Ref Range Status   Specimen Description BLOOD RIGHT ANTECUBITAL  Final   Special Requests   Final    BOTTLES DRAWN AEROBIC ONLY Blood Culture adequate volume   Culture  Setup Time   Final    GRAM POSITIVE COCCI AEROBIC BOTTLE ONLY CRITICAL VALUE NOTED.  VALUE IS CONSISTENT WITH PREVIOUSLY REPORTED AND CALLED VALUE. Performed at [facility], [place] [address], [place], [place] [address]    Culture STAPHYLOCOCCUS EPIDERMIDIS (A)  Final   Report Status 06/07/2020 FINAL  Final  Culture, blood (routine x 2)     Status: None   Collection Time: 06/05/20  7:34 PM   Specimen: BLOOD  Result Value Ref Range Status   Specimen Description BLOOD LEFT ANTECUBITAL  Final   Special Requests   Final    BOTTLES DRAWN AEROBIC AND ANAEROBIC Blood Culture adequate volume   Culture   Final    NO GROWTH 5 DAYS Performed at [facility], [place] [address] [place], [place] [address]  Report Status 06/10/2020 FINAL  Final  Culture, blood (routine x 2)     Status: None   Collection Time: 06/05/20  7:40 PM   Specimen: BLOOD LEFT HAND  Result Value Ref Range Status   Specimen Description BLOOD LEFT HAND  Final   Special Requests   Final    BOTTLES DRAWN AEROBIC ONLY Blood Culture adequate volume   Culture   Final    NO GROWTH 5 DAYS Performed at [facility], [place] [address], [place], [place] [address]    Report Status 06/10/2020 FINAL  Final    Radiology Studies: No results found.    Dahmir Epperly T. [place]  If 7PM-7AM, please contact night-coverage [email or web address] Password [facility] 06/10/2020, 4:05 PM

## 2020-06-11 NOTE — Progress Notes (Signed)
° °  Palliative Medicine Inpatient Follow Up Note   Reason for consult:  Goal of Care "Goal of care. Patient with metastatic adenocarcinoma and complex medical and social barriers for chemotherapy"  HPI:  Per Hospitalist Note --> 64 year old with no prior medical history who had acute shortness of breath respiratory failure oxygen saturation in the 40s on EMS arrival. He had a witnessed bradycardic, asystole arrest when EMS was present with 3 rounds of epi, 20 minutes of CPR prior to [place]. Underwent normothermia protocol. Work-up was significant for massive PE, DVT with right ventricular strain. Unfortunately hospital course was complicated by upper GI bleed due to gastric ulcer. A CT abdomen pelvis showed a large antral mass and multiple liver lesions biopsy confirmed a high-grade neuroendocrine tumor, subsequent EGD with biopsy revealing adenocarcinoma.   Palliative care was re-consulted in the setting of declining health state to verify goals moving forward. 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 Oncology has seen and evaluated this patient, he is not a candidate for chemotherapy.  Today's Discussion (06/11/2020): Chart reviewed. Met with Kenneth Sullivan at bedside. He was quite comfortable. Helped to get him some blankets. I shared with him that we are hopeful to coordinate a plan for him to get home with his niece in the [place] area.  I called patients niece this afternoon to further discuss the plan moving forward. I was unable to get into contact with her despite multiple attempts.I plan to continue checking in.  Discussed the importance of continued conversation with family and their  medical providers regarding overall plan of care and treatment options, ensuring decisions are within the context of the patients values and GOCs.  Questions and concerns addressed   SUMMARY OF  RECOMMENDATIONS DNAR/DNI  TOC --> Home hospice with patients niece Kenneth Sullivan if everything can be arranged. Kenneth Sullivan lives and [place] and needs to speak with hospice to identify the services they would be able to offer  Ongoing PMT discussions  Time Spent: 15 Greater than 50% of the time was spent in counseling and coordination of care ______________________________________________________________________________________ [facility] Team Team Cell Phone: [phone] Please utilize secure chat with additional questions, if there is no response within 30 minutes please call the above phone number  Palliative Medicine Team providers are available by phone from 7am to 7pm daily and can be reached through the team cell phone.  Should this patient require assistance outside of these hours, please call the patient's attending physician.

## 2020-06-11 NOTE — Progress Notes (Signed)
PROGRESS NOTE  Kenneth Sullivan GYJ:[id number] DOB: April 13, 1956   PCP: Medicine, [name]  Patient is from: home  DOA: 03/07/2020 LOS: 79  Brief Narrative / Interim history: 64 year old with no prior medical history who had acute shortness of breath respiratory failure oxygen saturation in the 40s on EMS arrival.  He had a witnessed bradycardic, asystole arrest when EMS was present with 3 rounds of epi, 20 minutes of CPR prior to [place]. Underwent normothermia protocol.  Work-up was significant for massive PE, DVT with right ventricular strain.  Unfortunately hospital course was complicated by upper GI bleed due to gastric ulcer.  A CT abdomen pelvis showed a large antral mass and multiple liver lesions biopsy confirmed a high-grade neuroendocrine tumor, subsequent EGD with biopsy revealing adenocarcinoma.  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   For his massive PE patient underwent IR guided mechanical thrombectomy and IVC filter.  Post procedure, while on heparin drip patient is started having maroon-colored red stool and bloody drainage from OGT.  GI did endoscopy which showed oozing gastric ulcer with clots.  IR did GDA embolization.  Patient remained encephalopathic. On 3/27, MRI of the brain showed multifocal acute to subacute ischemic infarct involving bilateral cerebral hemisphere consistent with global hypoperfusion related to cardiac arrest, also associated with scatter petechial hemorrhage with evidence of hemorrhagic conversion in the parietal lobe. CT abdomen and pelvis showed gastric lesion in the antrum concerning for antral mass and multiple liver lesion concerning for metastatic disease IR consulted and he underwent liver biopsy on 5/19.  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  Patient developed fever, leukocytosis on June 17.  Blood culture on 6/17 with Streptococcus Constellatus  (but one sample).  Repeat blood culture on 6/18 with staph epidermis in 1 out of 2 bottles 1 out of 2 growing staph coagulase negative likely contaminant.    Blood culture on 6/21 NGTD. Patient was started on Unasyn.  CT abdomen pelvis negative for abscess, but showed worsening liver metastatic diseases.  Oncology, Dr Benay Spice discussed case with patient's niece, [name] over the phone, and recommended the family to establish HCPOA and hospice unless his fever resolves and he returns today eating/ambulating to be considered for FOLFOX.  This is also complicated by complex medical and social issues   Subjective: Seen and examined earlier this morning. No major events overnight of this morning. Still hallucinating but no agitation no distress..   Objective: Vitals:   06/10/20 2012 06/11/20 0344 06/11/20 0513 06/11/20 0913  BP: 101/64 111/76  127/77  Pulse: (!) 109 (!) 102  (!) 106  Resp:  16  18  Temp: 99.6 F (37.6 C) 99.7 F (37.6 C)  98.7 F (37.1 C)  TempSrc: Oral Oral    SpO2: 100% 97%  98%  Weight:   80.7 kg   Height:        Intake/Output Summary (Last 24 hours) at 06/11/2020 1407 Last data filed at 06/10/2020 2145 Gross per 24 hour  Intake 150 ml  Output --  Net 150 ml   Filed Weights   05/31/20 0500 06/01/20 0254 06/11/20 0513  Weight: 63.5 kg 66.7 kg 80.7 kg    Examination:  GENERAL: No apparent distress.  Nontoxic. HEENT: MMM.  Vision and hearing grossly intact.  NECK: Supple.  No apparent JVD.  RESP: On RA. No IWOB.  Fair aeration bilaterally. CVS:  RRR. Heart sounds normal.  ABD/GI/GU: BS+. Abd soft, NTND.  MSK/EXT:  Moves extremities. No apparent deformity. No edema.  SKIN: no apparent skin lesion or wound NEURO: Awake, alert and oriented to self and place. No apparent focal neuro deficit. PSYCH: Calm. Seems to have hallucination  Significant events: 3/23>> Admit, EGD with findings of oozing gastric ulcer with clot, IR for mechanical thrombectomy, IVC  filter and GDA embolization 4/5>>Trach 4/28>>trach decannulated  Significant studies: CTA 3/23 >> bilateral PE with RV/with ratio 1.9, emphysema CT head 3/23 >>chronic microvascular changes. No acute findings Echo 3/23 >> RV is severely dilated with reduced function and D-shaped septum, LVEF 60-65%. Venogram 3/23>> occlusive DVT of the iliac vein extending to the lower IVC LE Venous Duplex 3/24 >>acute DVT in BLE up to the femoral vein  MRI Brain 3/28 >> multifocal acute to subacute ischemic infarcts involving bilateral cerebral hemispheres, findings consistent with global hypoperfusion event related to cardiac arrest, associated scattered petechial hemorrhage about many areas of ischemia, evidence of hemorrhagic conversion in the right parietal lobe  CT abdomen and pelvis on 6/19 with increased size of hepatic metastasis with new small liver lesions  Assessment & Plan: Acute hypoxic respiratory failure in the setting of massive PE/cardiac arrest: Resolved Managed in the ICU with status post tracheostomy subsequently decannulated.    Cardiac arrest with anoxic brain injury due to massive PE Massive PE with DVT status post IVC filter: -Status post IVC filter placement on mechanical thrombectomy. 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 Hallucination/delusion-still going on -Psych involved earlier in his hospitalization -Continue Seroquel  Upper GI bleed due large malignant gastric ulcer with acute blood loss anemia -EGD x2-revealed adenocarcinoma  -embolization of gastroduodenal artery by IR -Continue with PPI.  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  His sepsis physiology could also be due to malignancy. Repeat blood culture on 6/21 NGTD. -IV Unasyn for 10 days (through 06/10/2020), then observe off antibiotics.   Normocytic anemia: H&H stable. -Continue monitoring  Dysphagia:  Resolved.  On regular diet.   Constipation  -Continue Senokot-S  Hypokalemia -Monitor intermittently.  Leukocytosis/bandemia-due to bacteremia. Could be due to malignancy. -Check CBC intermittently  Debility/physical deconditioning -Continue PT/OT  Goal of care/DNR/DNI-patient is gradually deconditioning.  CT also showing increased liver mets with new lesions.  Oncology suggesting hospice given his current situation. -Needs further goal of care discussion-we will reconsult palliative care  Ethical issue-patient with new diagnosis of adenocarcinoma with metastasis to liver.  There is a question of treatment with chemo but multiple complicating factors medically and socially. 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 Although there is some improvement in his mental status, he is still confused with very poor insight into his situation.  No close family member other than niece, no place to stay, no reliable transportation... -Reconsulted palliative care for goal of care and ethical issues. Now plan is to discharge home with niece with home hospice  Severe malnutrition: Etiology chronic illness gastric lesion, multiple evaluation.  Continue with Ensure Body mass index is 22.85 kg/m. Nutrition Problem: Severe Malnutrition Etiology: chronic illness (gastric lesion, multiple liver lesions, concern for metastatic disease) Signs/Symptoms: severe fat depletion, severe muscle depletion Interventions: Ensure Enlive (each supplement provides  350kcal and 20 grams of protein), Magic cup   DVT prophylaxis:  SCDs Start: 03/07/20 0835  Code Status: DNR/DNI Family  Communication: Patient and/or RN. Available if any question. Status is: Inpatient  Remains inpatient appropriate because:Unsafe d/c plan   Dispo: The patient is from: Home              Anticipated d/c is to: Likely to niece home with home hospice.              Anticipated d/c date is: 2 days              Patient currently is medically stable to d/c.       Consultants:  Oncology Palliative medicine   Sch Meds:  Scheduled Meds: . clonazePAM  0.5 mg Oral BID  . feeding supplement (ENSURE ENLIVE)  237 mL Oral QID  . ferrous sulfate  325 mg Oral Q breakfast  . FLUoxetine  10 mg Oral QHS  . OXcarbazepine  75 mg Oral BID  . pantoprazole  40 mg Oral BID  . polyethylene glycol  17 g Oral BID  . QUEtiapine  100 mg Oral QHS  . QUEtiapine  75 mg Oral Daily  . senna-docusate  1 tablet Oral BID   Continuous Infusions: . sodium chloride    . sodium chloride 50 mL/hr at 06/05/20 1606   PRN Meds:.sodium chloride, acetaminophen, docusate sodium, guaiFENesin-dextromethorphan, hydrALAZINE, ipratropium-albuterol, LORazepam, oxyCODONE, sodium chloride flush  Antimicrobials: Anti-infectives (From admission, onward)   Start     Dose/Rate Route Frequency Ordered Stop   06/02/20 1630  Ampicillin-Sulbactam (UNASYN) 3 g in sodium chloride 0.9 % 100 mL IVPB        3 g 200 mL/hr over 30 Minutes Intravenous Every 6 hours 06/02/20 1626 06/10/20 2215   06/02/20 0645  cefTRIAXone (ROCEPHIN) 2 g in sodium chloride 0.9 % 100 mL IVPB  Status:  Discontinued        2 g 200 mL/hr over 30 Minutes Intravenous Daily 06/02/20 0622 06/02/20 1607   06/01/20 0715  cefTRIAXone (ROCEPHIN) 1 g in sodium chloride 0.9 % 100 mL IVPB  Status:  Discontinued        1 g 200 mL/hr over 30 Minutes Intravenous Every 24 hours 06/01/20 0710 06/02/20 0622   03/26/20 1100  doxycycline (VIBRAMYCIN) 100 mg in sodium chloride 0.9 % 250 mL IVPB  Status:  Discontinued        100 mg 125 mL/hr over 120 Minutes Intravenous 2 times  daily 03/26/20 1002 04/01/20 1035   03/25/20 1100  doxycycline (VIBRA-TABS) tablet 100 mg  Status:  Discontinued        100 mg Per Tube Every 12 hours 03/25/20 1009 03/26/20 1002   03/25/20 1015  doxycycline (VIBRAMYCIN) 100 mg in sodium chloride 0.9 % 250 mL IVPB  Status:  Discontinued       Note to Pharmacy: To be given after the tracheal aspirate is collected.   100 mg 125 mL/hr over 120 Minutes Intravenous Every 12 hours 03/25/20 1005 03/25/20 1008       I have personally reviewed the following labs and images: CBC: Recent Labs  Lab 06/05/20 0039 06/06/20 0347 06/08/20 0327 06/09/20 0751  WBC 13.4* 14.1* 15.3* 16.7*  NEUTROABS  --   --  12.3* 13.7*  HGB 7.6* 8.2* 7.2* 8.1*  HCT 23.8* 26.1* 23.1* 26.5*  MCV 87.5 87.9 87.2 88.6  PLT 313 362 446* 604*   BMP &GFR Recent Labs  Lab 06/05/20 0039  06/06/20 0347  NA 141 143  K 4.6 3.8  CL 108 108  CO2 24 25  GLUCOSE 98 92  BUN 7* 6*  CREATININE 0.86 0.76  CALCIUM 8.2* 8.4*  MG 1.9  --    Estimated Creatinine Clearance: 109.3 mL/min (by C-G formula based on SCr of 0.76 mg/dL). Liver & Pancreas: Recent Labs  Lab 06/05/20 0039  AST 18  ALT 10  ALKPHOS 86  BILITOT 0.8  PROT 5.3*  ALBUMIN 1.9*   No results for input(s): LIPASE, AMYLASE in the last 168 hours. No results for input(s): AMMONIA in the last 168 hours. Diabetic: No results for input(s): HGBA1C in the last 72 hours. No results for input(s): GLUCAP in the last 168 hours. Cardiac Enzymes: No results for input(s): CKTOTAL, CKMB, CKMBINDEX, TROPONINI in the last 168 hours. No results for input(s): PROBNP in the last 8760 hours. Coagulation Profile: No results for input(s): INR, PROTIME in the last 168 hours. Thyroid Function Tests: No results for input(s): TSH, T4TOTAL, FREET4, T3FREE, THYROIDAB in the last 72 hours. Lipid Profile: No results for input(s): CHOL, HDL, LDLCALC, TRIG, CHOLHDL, LDLDIRECT in the last 72 hours. Anemia Panel: No results for  input(s): VITAMINB12, FOLATE, FERRITIN, TIBC, IRON, RETICCTPCT in the last 72 hours. Urine analysis:    Component Value Date/Time   COLORURINE AMBER (A) 06/01/2020 0506   APPEARANCEUR HAZY (A) 06/01/2020 0506   LABSPEC 1.025 06/01/2020 0506   PHURINE 5.0 06/01/2020 0506   GLUCOSEU NEGATIVE 06/01/2020 0506   HGBUR NEGATIVE 06/01/2020 0506   BILIRUBINUR NEGATIVE 06/01/2020 0506   KETONESUR NEGATIVE 06/01/2020 0506   PROTEINUR NEGATIVE 06/01/2020 0506   NITRITE NEGATIVE 06/01/2020 0506   LEUKOCYTESUR LARGE (A) 06/01/2020 0506   Sepsis Labs: Invalid input(s): PROCALCITONIN, [place]  Microbiology: Recent Results (from the past 240 hour(s))  Culture, blood (Routine X 2) w Reflex to ID Panel     Status: None   Collection Time: 06/02/20 10:59 AM   Specimen: BLOOD  Result Value Ref Range Status   Specimen Description BLOOD LEFT ANTECUBITAL  Final   Special Requests   Final    BOTTLES DRAWN AEROBIC ONLY Blood Culture results may not be optimal due to an inadequate volume of blood received in culture bottles   Culture   Final    NO GROWTH 5 DAYS Performed at [facility], [place] [address], [place], [place] [address]    Report Status 06/07/2020 FINAL  Final  Culture, blood (Routine X 2) w Reflex to ID Panel     Status: Abnormal   Collection Time: 06/02/20 10:59 AM   Specimen: BLOOD  Result Value Ref Range Status   Specimen Description BLOOD RIGHT ANTECUBITAL  Final   Special Requests   Final    BOTTLES DRAWN AEROBIC ONLY Blood Culture adequate volume   Culture  Setup Time   Final    GRAM POSITIVE COCCI AEROBIC BOTTLE ONLY CRITICAL VALUE NOTED.  VALUE IS CONSISTENT WITH PREVIOUSLY REPORTED AND CALLED VALUE. Performed at [facility], [place] [address] [place], [place] [address]    Culture STAPHYLOCOCCUS EPIDERMIDIS (A)  Final   Report Status 06/07/2020 FINAL  Final  Culture, blood (routine x 2)     Status: None   Collection Time: 06/05/20  7:34 PM   Specimen: BLOOD    Result Value Ref Range Status   Specimen Description BLOOD LEFT ANTECUBITAL  Final   Special Requests   Final    BOTTLES DRAWN AEROBIC AND ANAEROBIC Blood  Culture adequate volume   Culture   Final    NO GROWTH 5 DAYS Performed at [facility], [place] [address], [place], [place] [address]    Report Status 06/10/2020 FINAL  Final  Culture, blood (routine x 2)     Status: None   Collection Time: 06/05/20  7:40 PM   Specimen: BLOOD LEFT HAND  Result Value Ref Range Status   Specimen Description BLOOD LEFT HAND  Final   Special Requests   Final    BOTTLES DRAWN AEROBIC ONLY Blood Culture adequate volume   Culture   Final    NO GROWTH 5 DAYS Performed at [facility], [place] [address], [place], [place] [address]    Report Status 06/10/2020 FINAL  Final    Radiology Studies: No results found.    Bona Hubbard T. [place]  If 7PM-7AM, please contact night-coverage [email or web address] Password [facility] 06/11/2020, 2:07 PM

## 2020-06-12 LAB — COMPREHENSIVE METABOLIC PANEL
ALT: 19 U/L (ref 0–44)
AST: 31 U/L (ref 15–41)
Albumin: 1.8 g/dL — ABNORMAL LOW (ref 3.5–5.0)
Alkaline Phosphatase: 93 U/L (ref 38–126)
Anion gap: 11 (ref 5–15)
BUN: 6 mg/dL — ABNORMAL LOW (ref 8–23)
CO2: 23 mmol/L (ref 22–32)
Calcium: 8.3 mg/dL — ABNORMAL LOW (ref 8.9–10.3)
Chloride: 110 mmol/L (ref 98–111)
Creatinine, Ser: 0.76 mg/dL (ref 0.61–1.24)
GFR calc Af Amer: >60 mL/min (ref >60)
GFR calc non Af Amer: >60 mL/min (ref >60)
Glucose, Bld: 98 mg/dL (ref 70–99)
Potassium: 3.3 mmol/L — ABNORMAL LOW (ref 3.5–5.1)
Sodium: 144 mmol/L (ref 135–145)
Total Bilirubin: 0.4 mg/dL (ref 0.3–1.2)
Total Protein: 5.9 g/dL — ABNORMAL LOW (ref 6.5–8.1)

## 2020-06-12 LAB — MAGNESIUM: Magnesium: 1.9 mg/dL (ref 1.7–2.4)

## 2020-06-12 MED ORDER — POTASSIUM CHLORIDE CRYS ER 20 MEQ PO TBCR
40.0000 meq | EXTENDED_RELEASE_TABLET | ORAL | Status: AC
  Administered 2020-06-12 (×2): 40 meq via ORAL
  Filled 2020-06-12 (×2): qty 2

## 2020-06-12 MED ORDER — LORAZEPAM 0.5 MG PO TABS
0.5000 mg | ORAL_TABLET | ORAL | 0 refills | Status: DC | PRN
Start: 2020-06-12 — End: 2020-07-03

## 2020-06-12 MED ORDER — OXYCODONE HCL 5 MG PO TABS
5.0000 mg | ORAL_TABLET | Freq: Four times a day (QID) | ORAL | Status: DC | PRN
  Administered 2020-06-13 – 2020-07-02 (×13): 5 mg via ORAL
  Filled 2020-06-12 (×15): qty 1

## 2020-06-12 MED ORDER — PANTOPRAZOLE SODIUM 40 MG PO TBEC
40.0000 mg | DELAYED_RELEASE_TABLET | Freq: Every day | ORAL | Status: DC
  Administered 2020-06-13 – 2020-07-03 (×21): 40 mg via ORAL
  Filled 2020-06-12 (×21): qty 1

## 2020-06-12 MED ORDER — MORPHINE SULFATE (CONCENTRATE) 10 MG /0.5 ML PO SOLN: 10.0000 mg | ORAL | 0 refills | Status: DC | PRN

## 2020-06-12 MED ORDER — ONDANSETRON HCL 4 MG PO TABS: 4.0000 mg | ORAL_TABLET | Freq: Every day | ORAL | 1 refills | Status: DC | PRN

## 2020-06-12 MED ORDER — GLYCOPYRROLATE 1 MG PO TABS
1.0000 mg | ORAL_TABLET | Freq: Three times a day (TID) | ORAL | 0 refills | Status: DC | PRN
Start: 2020-06-12 — End: 2020-07-03

## 2020-06-12 NOTE — TOC Progression Note (Addendum)
Transition of Care [facility]) - Progression Note    Patient Details  Name: Kenneth Sullivan  MRN: [id number] Date of Birth: 1956-12-06  [facility] CM/SW [place], RN Phone Number: [phone]  06/12/2020, 3:02 PM  Clinical Narrative:    CM called niece [name] to discuss discharge plan.Niece states that there is no way that she can bring her uncle home to care for him full time because she is currently working 2 jobs and raising 2 children. She states that this is just impossible for her at this time. Niece states that she loves her uncle and plans to visit real soon but doesn't want to be made to feel bad because she is unable to care for him. Niece states that the best option would be to place her uncle in a facility that could take care of him and she would visit him. She prefers somewhere close to [place] or [facility]. [name] abruptly had to end the conversation because she states that she is at work and it is totally inappropriate to be on her job and talking on the phone about her uncles care. CM explained that she understood and would tough bases with her soon.   Expected Discharge Plan: Skilled Nursing Facility Barriers to Discharge: No SNF bed  Expected Discharge Plan and Services Expected Discharge Plan: [place] In-house Referral: Clinical Social Work     Living arrangements for the past 2 months: Apartment                                       Social Determinants of Health (SDOH) Interventions    Readmission Risk Interventions No flowsheet data found.

## 2020-06-12 NOTE — Progress Notes (Signed)
PROGRESS NOTE  Kenneth Sullivan NOB:[id number] DOB: 06-15-1956   PCP: Medicine, [name]  Patient is from: home  DOA: 03/07/2020 LOS: 36  Brief Narrative / Interim history: 64 year old with no prior medical history who had acute shortness of breath respiratory failure oxygen saturation in the 40s on EMS arrival.  He had a witnessed bradycardic, asystole arrest when EMS was present with 3 rounds of epi, 20 minutes of CPR prior to [place]. Underwent normothermia protocol.  Work-up was significant for massive PE, DVT with right ventricular strain.  Unfortunately hospital course was complicated by upper GI bleed due to gastric ulcer.  A CT abdomen pelvis showed a large antral mass and multiple liver lesions biopsy confirmed a high-grade neuroendocrine tumor, subsequent EGD with biopsy revealing adenocarcinoma.  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   For his massive PE patient underwent IR guided mechanical thrombectomy and IVC filter.  Post procedure, while on heparin drip patient is started having maroon-colored red stool and bloody drainage from OGT.  GI did endoscopy which showed oozing gastric ulcer with clots.  IR did GDA embolization.  Patient remained encephalopathic. On 3/27, MRI of the brain showed multifocal acute to subacute ischemic infarct involving bilateral cerebral hemisphere consistent with global hypoperfusion related to cardiac arrest, also associated with scatter petechial hemorrhage with evidence of hemorrhagic conversion in the parietal lobe. CT abdomen and pelvis showed gastric lesion in the antrum concerning for antral mass and multiple liver lesion concerning for metastatic disease IR consulted and he underwent liver biopsy on 5/19.  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  Patient developed fever, leukocytosis on June 17.  Blood culture on 6/17 with Streptococcus Constellatus  (but one sample).  Repeat blood culture on 6/18 with staph epidermis in 1 out of 2 bottles 1 out of 2 growing staph coagulase negative likely contaminant.    Blood culture on 6/21 NGTD. Patient was started on Unasyn.  CT abdomen pelvis negative for abscess, but showed worsening liver metastatic diseases.  Oncology, Dr Benay Spice discussed case with patient's niece, [name] over the phone, and recommended the family to establish HCPOA and hospice unless his fever resolves and he returns today eating/ambulating to be considered for FOLFOX.  This is also complicated by complex medical and social issues.  Palliative medicine reconsulted and talked to patient's niece (only family member). The plan is discharge home with home hospice to [facility] home but [facility] hasn't been able to get hold of her.    Subjective: Seen and examined earlier this morning.  No major events overnight of this morning.  Having periods of confusion and hallucination.   Objective: Vitals:   06/11/20 0513 06/11/20 0913 06/11/20 2300 06/12/20 0810  BP:  127/77 113/63 117/84  Pulse:  (!) 106 100 (!) 102  Resp:  18 18 17   Temp:  98.7 F (37.1 C) 98.7 F (37.1 C) 98.9 F (37.2 C)  TempSrc:   Oral   SpO2:  98% 100% 96%  Weight: 80.7 kg     Height:        Intake/Output Summary (Last 24 hours) at 06/12/2020 1423 Last data filed at 06/12/2020 0700 Gross per 24 hour  Intake 475 ml  Output --  Net 475 ml   Filed Weights   05/31/20 0500 06/01/20 0254 06/11/20 0513  Weight: 63.5 kg 66.7 kg 80.7 kg    Examination:  GENERAL: No apparent distress.  Nontoxic. HEENT: MMM.  Vision  and hearing grossly intact.  NECK: Supple.  No apparent JVD.  RESP: On room air.  No IWOB.  Fair aeration bilaterally. CVS:  RRR. Heart sounds normal.  ABD/GI/GU: BS+. Abd soft, NTND.  MSK/EXT:  Moves extremities. No apparent deformity. No edema.  SKIN: no apparent skin lesion or wound NEURO: Awake, alert and oriented to self and place but  confused.  No apparent focal neuro deficit. PSYCH: Calm but seems to have hallucinations.  Significant events: 3/23>> Admit, EGD with findings of oozing gastric ulcer with clot, IR for mechanical thrombectomy, IVC filter and GDA embolization 4/5>>Trach 4/28>>trach decannulated  Significant studies: CTA 3/23 >> bilateral PE with RV/with ratio 1.9, emphysema CT head 3/23 >>chronic microvascular changes. No acute findings Echo 3/23 >> RV is severely dilated with reduced function and D-shaped septum, LVEF 60-65%. Venogram 3/23>> occlusive DVT of the iliac vein extending to the lower IVC LE Venous Duplex 3/24 >>acute DVT in BLE up to the femoral vein  MRI Brain 3/28 >> multifocal acute to subacute ischemic infarcts involving bilateral cerebral hemispheres, findings consistent with global hypoperfusion event related to cardiac arrest, associated scattered petechial hemorrhage about many areas of ischemia, evidence of hemorrhagic conversion in the right parietal lobe  CT abdomen and pelvis on 6/19 with increased size of hepatic metastasis with new small liver lesions  Assessment & Plan: Acute hypoxic respiratory failure in the setting of massive PE/cardiac arrest: Resolved Managed in the ICU with status post tracheostomy subsequently decannulated.    Cardiac arrest with anoxic brain injury due to massive PE Massive PE with DVT status post IVC filter: -Status post IVC filter placement on mechanical thrombectomy. 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 Hallucination/delusion-still going on -Psych involved earlier in his hospitalization -Continue Seroquel  Upper GI bleed due large malignant gastric ulcer with acute blood loss anemia -EGD x2-revealed adenocarcinoma  -embolization of gastroduodenal artery by IR -Continue with PPI.  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  His sepsis physiology could also be due to malignancy. Repeat blood culture on 6/21 NGTD. -IV Unasyn for 10 days (through 06/10/2020), then observe off antibiotics.   Normocytic anemia: H&H stable. -Continue monitoring  Dysphagia:  Resolved.  On regular diet.   Constipation  -Continue Senokot-S  Hypokalemia -Replenish and recheck.  Leukocytosis/bandemia-due to bacteremia. Could be due to malignancy. -Check CBC intermittently  Debility/physical deconditioning -Continue PT/OT  Goal of care/DNR/DNI-patient is gradually deconditioning.  CT also showing increased liver mets with new lesions.  Oncology suggesting hospice given his current situation. -Needs further goal of care discussion-we will reconsult palliative care  Ethical issue-patient with new diagnosis of adenocarcinoma with metastasis to liver.  There is a question of treatment with chemo but multiple complicating factors medically and socially. 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 Although there is some improvement in his mental status, he is still confused with very poor insight into his situation.  No close family member other than niece, no place to stay, no reliable transportation... -Reconsulted palliative care for goal of care and ethical issues. Now plan is to discharge home with home hospice to niece's house but difficult to get in touch with  niece.   Severe malnutrition: Etiology chronic illness gastric lesion, multiple evaluation.  Continue with Ensure Body mass index is 22.85 kg/m. Nutrition Problem: Severe Malnutrition Etiology: chronic illness (gastric lesion, multiple liver lesions, concern for  metastatic disease) Signs/Symptoms: severe fat depletion, severe muscle depletion Interventions: Ensure Enlive (each supplement provides 350kcal and 20 grams of protein), Magic cup   DVT prophylaxis:  SCDs Start: 03/07/20 0835  Code Status: DNR/DNI Family Communication: Patient and/or RN. Available if any question. Status is: Inpatient  Remains inpatient appropriate because:Unsafe d/c plan   Dispo: The patient is from: Home              Anticipated d/c is to: Likely to niece home with home hospice.              Anticipated d/c date is: 2 days              Patient currently is medically stable to d/c.       Consultants:  Oncology Palliative medicine   Sch Meds:  Scheduled Meds: . clonazePAM  0.5 mg Oral BID  . feeding supplement (ENSURE ENLIVE)  237 mL Oral QID  . ferrous sulfate  325 mg Oral Q breakfast  . FLUoxetine  10 mg Oral QHS  . OXcarbazepine  75 mg Oral BID  . [START ON 06/13/2020] pantoprazole  40 mg Oral Daily  . polyethylene glycol  17 g Oral BID  . QUEtiapine  100 mg Oral QHS  . QUEtiapine  75 mg Oral Daily  . senna-docusate  1 tablet Oral BID   Continuous Infusions: . sodium chloride    . sodium chloride 50 mL/hr at 06/11/20 2237   PRN Meds:.sodium chloride, acetaminophen, docusate sodium, guaiFENesin-dextromethorphan, hydrALAZINE, ipratropium-albuterol, LORazepam, oxyCODONE, sodium chloride flush  Antimicrobials: Anti-infectives (From admission, onward)   Start     Dose/Rate Route Frequency Ordered Stop   06/02/20 1630  Ampicillin-Sulbactam (UNASYN) 3 g in sodium chloride 0.9 % 100 mL IVPB        3 g 200 mL/hr over 30 Minutes Intravenous Every 6 hours 06/02/20 1626 06/10/20 2215   06/02/20 0645  cefTRIAXone (ROCEPHIN) 2 g in sodium chloride 0.9 % 100 mL IVPB  Status:  Discontinued        2 g 200 mL/hr over 30 Minutes Intravenous Daily 06/02/20 0622 06/02/20 1607   06/01/20 0715  cefTRIAXone (ROCEPHIN) 1 g in sodium chloride 0.9 % 100 mL IVPB   Status:  Discontinued        1 g 200 mL/hr over 30 Minutes Intravenous Every 24 hours 06/01/20 0710 06/02/20 0622   03/26/20 1100  doxycycline (VIBRAMYCIN) 100 mg in sodium chloride 0.9 % 250 mL IVPB  Status:  Discontinued        100 mg 125 mL/hr over 120 Minutes Intravenous 2 times daily 03/26/20 1002 04/01/20 1035   03/25/20 1100  doxycycline (VIBRA-TABS) tablet 100 mg  Status:  Discontinued        100 mg Per Tube Every 12 hours 03/25/20 1009 03/26/20 1002   03/25/20 1015  doxycycline (VIBRAMYCIN) 100 mg in sodium chloride 0.9 % 250 mL IVPB  Status:  Discontinued       Note to Pharmacy: To be given after the tracheal aspirate is collected.   100 mg 125 mL/hr over 120 Minutes Intravenous Every 12 hours 03/25/20 1005 03/25/20 1008       I have personally reviewed the following labs and images: CBC: Recent Labs  Lab 06/06/20 0347 06/08/20 0327  06/09/20 0751  WBC 14.1* 15.3* 16.7*  NEUTROABS  --  12.3* 13.7*  HGB 8.2* 7.2* 8.1*  HCT 26.1* 23.1* 26.5*  MCV 87.9 87.2 88.6  PLT 362 446* 604*   BMP &GFR Recent Labs  Lab 06/06/20 0347 06/12/20 0351  NA 143 144  K 3.8 3.3*  CL 108 110  CO2 25 23  GLUCOSE 92 98  BUN 6* 6*  CREATININE 0.76 0.76  CALCIUM 8.4* 8.3*  MG  --  1.9   Estimated Creatinine Clearance: 109.3 mL/min (by C-G formula based on SCr of 0.76 mg/dL). Liver & Pancreas: Recent Labs  Lab 06/12/20 0351  AST 31  ALT 19  ALKPHOS 93  BILITOT 0.4  PROT 5.9*  ALBUMIN 1.8*   No results for input(s): LIPASE, AMYLASE in the last 168 hours. No results for input(s): AMMONIA in the last 168 hours. Diabetic: No results for input(s): HGBA1C in the last 72 hours. No results for input(s): GLUCAP in the last 168 hours. Cardiac Enzymes: No results for input(s): CKTOTAL, CKMB, CKMBINDEX, TROPONINI in the last 168 hours. No results for input(s): PROBNP in the last 8760 hours. Coagulation Profile: No results for input(s): INR, PROTIME in the last 168 hours. Thyroid  Function Tests: No results for input(s): TSH, T4TOTAL, FREET4, T3FREE, THYROIDAB in the last 72 hours. Lipid Profile: No results for input(s): CHOL, HDL, LDLCALC, TRIG, CHOLHDL, LDLDIRECT in the last 72 hours. Anemia Panel: No results for input(s): VITAMINB12, FOLATE, FERRITIN, TIBC, IRON, RETICCTPCT in the last 72 hours. Urine analysis:    Component Value Date/Time   COLORURINE AMBER (A) 06/01/2020 0506   APPEARANCEUR HAZY (A) 06/01/2020 0506   LABSPEC 1.025 06/01/2020 0506   PHURINE 5.0 06/01/2020 0506   GLUCOSEU NEGATIVE 06/01/2020 0506   HGBUR NEGATIVE 06/01/2020 0506   BILIRUBINUR NEGATIVE 06/01/2020 0506   KETONESUR NEGATIVE 06/01/2020 0506   PROTEINUR NEGATIVE 06/01/2020 0506   NITRITE NEGATIVE 06/01/2020 0506   LEUKOCYTESUR LARGE (A) 06/01/2020 0506   Sepsis Labs: Invalid input(s): PROCALCITONIN, [place]  Microbiology: Recent Results (from the past 240 hour(s))  Culture, blood (routine x 2)     Status: None   Collection Time: 06/05/20  7:34 PM   Specimen: BLOOD  Result Value Ref Range Status   Specimen Description BLOOD LEFT ANTECUBITAL  Final   Special Requests   Final    BOTTLES DRAWN AEROBIC AND ANAEROBIC Blood Culture adequate volume   Culture   Final    NO GROWTH 5 DAYS Performed at [facility], [address] [place], [place] [address]    Report Status 06/10/2020 FINAL  Final  Culture, blood (routine x 2)     Status: None   Collection Time: 06/05/20  7:40 PM   Specimen: BLOOD LEFT HAND  Result Value Ref Range Status   Specimen Description BLOOD LEFT HAND  Final   Special Requests   Final    BOTTLES DRAWN AEROBIC ONLY Blood Culture adequate volume   Culture   Final    NO GROWTH 5 DAYS Performed at [facility], [place] [address], [place], [place] [address]    Report Status 06/10/2020 FINAL  Final    Radiology Studies: No results found.    Faris Coolman T. [place]  If 7PM-7AM, please contact  night-coverage [email or web address] Password [facility] 06/12/2020, 2:23 PM

## 2020-06-12 NOTE — Progress Notes (Addendum)
HEMATOLOGY-ONCOLOGY PROGRESS NOTE  SUBJECTIVE: Alert but having periods of confusion.  Denies abdominal pain, nausea, vomiting.  No recurrent fevers.  Repeat blood cultures drawn 06/05/2020 are negative.  PHYSICAL EXAMINATION:  Vitals:   06/11/20 2300 06/12/20 0810  BP: 113/63 117/84  Pulse: 100 (!) 102  Resp: 18 17  Temp: 98.7 F (37.1 C) 98.9 F (37.2 C)  SpO2: 100% 96%   Filed Weights   05/31/20 0500 06/01/20 0254 06/11/20 0513  Weight: 63.5 kg 66.7 kg 80.7 kg    Intake/Output from previous day: 06/27 0701 - 06/28 0700 In: 475 [I.V.:475] Out: -   GENERAL:alert, no distress and comfortable HEENT: No pain with neck motion ABDOMEN:abdomen soft and nontender NEURO: Oriented to place, remains confused.    LABORATORY DATA:  I have reviewed the data as listed CMP Latest Ref Rng & Units 06/12/2020 06/06/2020 06/05/2020  Glucose 70 - 99 mg/dL 98 92 98  BUN 8 - 23 mg/dL 6(L) 6(L) 7(L)  Creatinine 0.61 - 1.24 mg/dL 0.76 0.76 0.86  Sodium 135 - 145 mmol/L 144 143 141  Potassium 3.5 - 5.1 mmol/L 3.3(L) 3.8 4.6  Chloride 98 - 111 mmol/L 110 108 108  CO2 22 - 32 mmol/L 23 25 24   Calcium 8.9 - 10.3 mg/dL 8.3(L) 8.4(L) 8.2(L)  Total Protein 6.5 - 8.1 g/dL 5.9(L) - 5.3(L)  Total Bilirubin 0.3 - 1.2 mg/dL 0.4 - 0.8  Alkaline Phos 38 - 126 U/L 93 - 86  AST 15 - 41 U/L 31 - 18  ALT 0 - 44 U/L 19 - 10    Lab Results  Component Value Date   WBC 16.7 (H) 06/09/2020   HGB 8.1 (L) 06/09/2020   HCT 26.5 (L) 06/09/2020   MCV 88.6 06/09/2020   PLT 604 (H) 06/09/2020   NEUTROABS 13.7 (H) 06/09/2020    CT ABDOMEN PELVIS W CONTRAST  Result Date: 06/04/2020 CLINICAL DATA:  Abdominal pain. Fever of unknown origin. Upper GI bleed secondary to large malignant gastric ulcer with acute blood loss anemia. Bloody stool last night. EXAM: CT ABDOMEN AND PELVIS WITH CONTRAST TECHNIQUE: Multidetector CT imaging of the abdomen and pelvis was performed using the standard protocol following bolus  administration of intravenous contrast. CONTRAST:  138m OMNIPAQUE IOHEXOL 300 MG/ML  SOLN COMPARISON:  Abdominal CT 05/01/2020 FINDINGS: Lower chest: Small right pleural effusion which has slightly increased from CT last month. Trace left pleural effusion which is new. Scattered basilar atelectasis. Heart is normal in size. Hepatobiliary: Increased size of dominant left lobe liver lesion since last month, currently 8 cm, previously 5.4 cm. Subcapsular right lobe lesion is also increased in size currently 4.5 cm, previously 2.4 cm. Smaller subcapsular lesion in the right lobe is also slightly increased in size. There are new lesions in the central right lobe, series 3, image 11, and possibly left lobe series 3, image 26. Gallbladder physiologically distended. No biliary dilatation. Pancreas: No ductal dilatation or inflammation. Spleen: Normal in size without focal abnormality. Adrenals/Urinary Tract: Normal adrenal glands. No hydronephrosis or perinephric edema. Homogeneous renal enhancement with symmetric excretion on delayed phase imaging. Left renal cyst unchanged from prior exam. Urinary bladder is partially distended without wall thickening. Stomach/Bowel: The stomach is nondistended. The known gastric mass is difficult to delineate by CT, but roughly in the region of series 3, image 24. There is no obvious gastric wall thickening. No small bowel obstruction. Administered enteric contrast reaches the colon. No small bowel inflammation. Moderate colonic stool burden without colonic wall thickening.  There is sigmoid colonic tortuosity. Vascular/Lymphatic: Infrarenal IVC filter in place. Aortic atherosclerosis without aneurysm. The portal vein is patent. Embolization coils in the upper abdomen. No bulky abdominopelvic adenopathy. Reproductive: Prostate is unremarkable. Other: Trace loculated subcapsular fluid adjacent to the left lobe of the liver. No other ascites. Trace presacral edema. Generalized body wall  edema in the flanks. No free intra-abdominal air or abscess. Musculoskeletal: Mild chronic L2 superior endplate compression fracture, unchanged. No acute osseous abnormality or focal bone lesion. IMPRESSION: 1. Increased size of hepatic metastasis since CT last month. There is also a new small liver lesion. 2. The known gastric mass is difficult to delineate on the current exam given nondistended stomach. 3. Small right and trace left pleural effusions, slightly increased from CT last month. 4. Moderate colonic stool burden with sigmoid colonic tortuosity, can be seen with constipation. No bowel obstruction or inflammation. Aortic Atherosclerosis (ICD10-I70.0). Electronically Signed   By: Keith Rake M.D.   On: 06/04/2020 00:50   DG CHEST PORT 1 VIEW  Result Date: 06/01/2020 CLINICAL DATA:  History of previous pulmonary embolism with right ventricular strain, cough and fevers EXAM: PORTABLE CHEST 1 VIEW COMPARISON:  04/04/2020 FINDINGS: Cardiac shadow is stable. Tracheostomy tube and feeding catheter has been removed in the interval. Lungs are well aerated bilaterally. No focal infiltrate or sizable effusion is seen. Focal nodular density is again noted in the left upper lobe consistent with dilated pulmonary arterial branch. IMPRESSION: No acute abnormality noted. Electronically Signed   By: Inez Catalina M.D.   On: 06/01/2020 10:52    ASSESSMENT AND PLAN: 1.    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  Biopsy was consistent with  adenocarcinoma with  neuroendocrine component.             -CT abdomen/pelvis 06/03/2020-increased size of hepatic metastases, new liver lesions 2.  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  Tracheostomy placement on 03/20/2020, now healed 4.  Cardiac arrest on 03/07/2020 5.  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  History of acute blood loss anemia likely due to upper GI bleed 7.  Delusions/hallucinations 8.  Fever/bacteremia 05/31/2020  9.  Anemia secondary to chronic disease, malnutrition, infection, and GI bleeding  Mr. Denardo appears unchanged today.  He is alert but continues to have confusion.  We do not feel that he is a candidate for chemotherapy.  Palliative care was reconsulted this past weekend and case was discussed with his niece.  Niece is now in agreement to home with hospice versus residential hospice.  TOC consult was placed over the weekend who is working on these arrangements.  Recommendations: 1.  Appreciate assistance from palliative care team.  Plan is for the patient to either go home with hospice versus residential hospice. 2.  We will sign off.  Please reconsult if needed.   LOS: 97 days    Mikey Bussing 06/12/20 Mr. Shavers appears unchanged.  The plan for discharge to home with his niece and hospice is noted.  Please call oncology as needed.  I am available to discuss the case further  with his niece or other family members.

## 2020-06-13 NOTE — Progress Notes (Signed)
Patient asleep and resting well. He is easily aroused. Bed is in lowest position and patient door open for visibility.

## 2020-06-13 NOTE — Progress Notes (Signed)
PROGRESS NOTE  Kenneth Sullivan FAO:[id number] DOB: Jun 16, 1956   PCP: Medicine, [name]  Patient is from: home  DOA: 03/07/2020 LOS: 54  Brief Narrative / Interim history: 64 year old with no prior medical history who had acute shortness of breath respiratory failure oxygen saturation in the 40s on EMS arrival.  He had a witnessed bradycardic, asystole arrest when EMS was present with 3 rounds of epi, 20 minutes of CPR prior to [place]. Underwent normothermia protocol.  Work-up was significant for massive PE, DVT with right ventricular strain.  Unfortunately hospital course was complicated by upper GI bleed due to gastric ulcer.  A CT abdomen pelvis showed a large antral mass and multiple liver lesions biopsy confirmed a high-grade neuroendocrine tumor, subsequent EGD with biopsy revealing adenocarcinoma.  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   For his massive PE patient underwent IR guided mechanical thrombectomy and IVC filter.  Post procedure, while on heparin drip patient is started having maroon-colored red stool and bloody drainage from OGT.  GI did endoscopy which showed oozing gastric ulcer with clots.  IR did GDA embolization.  Patient remained encephalopathic. On 3/27, MRI of the brain showed multifocal acute to subacute ischemic infarct involving bilateral cerebral hemisphere consistent with global hypoperfusion related to cardiac arrest, also associated with scatter petechial hemorrhage with evidence of hemorrhagic conversion in the parietal lobe. CT abdomen and pelvis showed gastric lesion in the antrum concerning for antral mass and multiple liver lesion concerning for metastatic disease IR consulted and he underwent liver biopsy on 5/19.  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  Patient developed fever, leukocytosis on June 17.  Blood culture on 6/17 with Streptococcus Constellatus  (but one sample).  Repeat blood culture on 6/18 with staph epidermis in 1 out of 2 bottles 1 out of 2 growing staph coagulase negative likely contaminant.    Blood culture on 6/21 NGTD. Patient was started on Unasyn.  CT abdomen pelvis negative for abscess, but showed worsening liver metastatic diseases.  Oncology, Dr Benay Spice discussed case with patient's niece, [name] over the phone, and recommended the family to establish HCPOA and hospice unless his fever resolves and he returns eating/ambulating well to be considered for chemotherapy which does not seem to be the case now.  Oncology signed off.  Plan is for discharge to ALF or SNF with hospice but difficult to place.    Subjective: Seen and examined earlier this morning.  No major events overnight of this morning.  No complaints.  Continues to have confusion and intermittent hallucinations.  No distress or agitation.  Objective: Vitals:   06/12/20 0810 06/12/20 1554 06/12/20 2218 06/13/20 0753  BP: 117/84 117/85 126/81 132/83  Pulse: (!) 102 (!) 104 (!) 101 99  Resp: 17 19    Temp: 98.9 F (37.2 C) 97.8 F (36.6 C) 98.8 F (37.1 C) 98.3 F (36.8 C)  TempSrc:      SpO2: 96% 96% 96% 97%  Weight:      Height:        Intake/Output Summary (Last 24 hours) at 06/13/2020 1345 Last data filed at 06/13/2020 1000 Gross per 24 hour  Intake 818.16 ml  Output --  Net 818.16 ml   Filed Weights   05/31/20 0500 06/01/20 0254 06/11/20 0513  Weight: 63.5 kg 66.7 kg 80.7 kg    Examination:  GENERAL: No apparent distress.  Nontoxic. HEENT: MMM.  Vision and hearing grossly intact.  NECK: Supple.  No apparent JVD.  RESP: On RA.  No IWOB.  Fair aeration bilaterally. CVS:  RRR. Heart sounds normal.  ABD/GI/GU: BS+. Abd soft, NTND.  MSK/EXT:  Moves extremities. No apparent deformity. No edema.  SKIN: no apparent skin lesion or wound NEURO: Awake, alert and oriented to self.  Confused.  Follows commands.  No apparent focal neuro  deficit. PSYCH: Calm.  Seems to have hallucinations.  No distress or agitation.  Significant events: 3/23>> Admit, EGD with findings of oozing gastric ulcer with clot, IR for mechanical thrombectomy, IVC filter and GDA embolization 4/5>>Trach 4/28>>trach decannulated  Significant studies: CTA 3/23 >> bilateral PE with RV/with ratio 1.9, emphysema CT head 3/23 >>chronic microvascular changes. No acute findings Echo 3/23 >> RV is severely dilated with reduced function and D-shaped septum, LVEF 60-65%. Venogram 3/23>> occlusive DVT of the iliac vein extending to the lower IVC LE Venous Duplex 3/24 >>acute DVT in BLE up to the femoral vein  MRI Brain 3/28 >> multifocal acute to subacute ischemic infarcts involving bilateral cerebral hemispheres, findings consistent with global hypoperfusion event related to cardiac arrest, associated scattered petechial hemorrhage about many areas of ischemia, evidence of hemorrhagic conversion in the right parietal lobe  CT abdomen and pelvis on 6/19 with increased size of hepatic metastasis with new small liver lesions  Assessment & Plan: Acute hypoxic respiratory failure in the setting of massive PE/cardiac arrest: Resolved -Managed in the ICU with tracheostomy and subsequently decannulated.    Cardiac arrest with anoxic brain injury due to massive PE Massive PE with DVT status post IVC filter: -Status post IVC filter placement on mechanical thrombectomy. 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 Hallucination/delusion-still going on -Psych involved earlier in his hospitalization -Continue Seroquel  Upper GI bleed due large malignant gastric ulcer with acute blood loss anemia -EGD x2-revealed adenocarcinoma  -embolization of gastroduodenal artery by IR -Continue with PPI.  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  Oncology signed off.   -Plan is for discharge to ALF or SNF with hospice  Sepsis due to streptococcus bacteremia? Repeat blood culture on 6/21 NGTD. IV Unasyn for 10 days through 06/10/2020.  Remains afebrile but with mild tachycardia and leukocytosis which could also be due to malignancy.  Normocytic anemia: H&H stable. -Continue monitoring  Dysphagia:  Resolved.  On regular diet.   Constipation  -Continue Senokot-S  Hypokalemia -Replenish and recheck.  Leukocytosis/bandemia-due to bacteremia? Could be due to malignancy. -Check CBC intermittently  Debility/physical deconditioning -Continue PT/OT while here   Goal of care/DNR/DNI-patient is gradually deconditioning.  Plan for hospice follow-up on discharge   Severe malnutrition: Etiology chronic illness gastric lesion, multiple evaluation.  Continue with Ensure Body mass index is 22.85 kg/m. Nutrition Problem: Severe Malnutrition Etiology: chronic illness (gastric lesion, multiple liver lesions, concern for metastatic disease) Signs/Symptoms: severe fat depletion, severe muscle depletion Interventions: Ensure Enlive (each supplement provides 350kcal and 20 grams of protein), Magic cup   DVT prophylaxis:  SCDs Start: 03/07/20 0835  Code Status: DNR/DNI Family Communication: Patient and/or RN. Available if any question. Status is: Inpatient  Remains inpatient appropriate because:Unsafe d/c plan   Dispo: The patient is from: Home  Anticipated d/c is to: SNF or ALF with hospice              Anticipated d/c date is: 2 days              Patient currently is medically stable to d/c.       Consultants:  Oncology Palliative medicine   Sch Meds:  Scheduled Meds: . clonazePAM  0.5 mg Oral BID  . feeding supplement  (ENSURE ENLIVE)  237 mL Oral QID  . ferrous sulfate  325 mg Oral Q breakfast  . FLUoxetine  10 mg Oral QHS  . OXcarbazepine  75 mg Oral BID  . pantoprazole  40 mg Oral Daily  . polyethylene glycol  17 g Oral BID  . QUEtiapine  100 mg Oral QHS  . QUEtiapine  75 mg Oral Daily  . senna-docusate  1 tablet Oral BID   Continuous Infusions: . sodium chloride    . sodium chloride 50 mL/hr at 06/12/20 1852   PRN Meds:.sodium chloride, acetaminophen, docusate sodium, guaiFENesin-dextromethorphan, hydrALAZINE, ipratropium-albuterol, LORazepam, oxyCODONE, sodium chloride flush  Antimicrobials: Anti-infectives (From admission, onward)   Start     Dose/Rate Route Frequency Ordered Stop   06/02/20 1630  Ampicillin-Sulbactam (UNASYN) 3 g in sodium chloride 0.9 % 100 mL IVPB        3 g 200 mL/hr over 30 Minutes Intravenous Every 6 hours 06/02/20 1626 06/10/20 2215   06/02/20 0645  cefTRIAXone (ROCEPHIN) 2 g in sodium chloride 0.9 % 100 mL IVPB  Status:  Discontinued        2 g 200 mL/hr over 30 Minutes Intravenous Daily 06/02/20 0622 06/02/20 1607   06/01/20 0715  cefTRIAXone (ROCEPHIN) 1 g in sodium chloride 0.9 % 100 mL IVPB  Status:  Discontinued        1 g 200 mL/hr over 30 Minutes Intravenous Every 24 hours 06/01/20 0710 06/02/20 0622   03/26/20 1100  doxycycline (VIBRAMYCIN) 100 mg in sodium chloride 0.9 % 250 mL IVPB  Status:  Discontinued        100 mg 125 mL/hr over 120 Minutes Intravenous 2 times daily 03/26/20 1002 04/01/20 1035   03/25/20 1100  doxycycline (VIBRA-TABS) tablet 100 mg  Status:  Discontinued        100 mg Per Tube Every 12 hours 03/25/20 1009 03/26/20 1002   03/25/20 1015  doxycycline (VIBRAMYCIN) 100 mg in sodium chloride 0.9 % 250 mL IVPB  Status:  Discontinued       Note to Pharmacy: To be given after the tracheal aspirate is collected.   100 mg 125 mL/hr over 120 Minutes Intravenous Every 12 hours 03/25/20 1005 03/25/20 1008       I have personally reviewed the  following labs and images: CBC: Recent Labs  Lab 06/08/20 0327 06/09/20 0751  WBC 15.3* 16.7*  NEUTROABS 12.3* 13.7*  HGB 7.2* 8.1*  HCT 23.1* 26.5*  MCV 87.2 88.6  PLT 446* 604*   BMP &GFR Recent Labs  Lab 06/12/20 0351  NA 144  K 3.3*  CL 110  CO2 23  GLUCOSE 98  BUN 6*  CREATININE 0.76  CALCIUM 8.3*  MG 1.9   Estimated Creatinine Clearance: 109.3 mL/min (by C-G formula based on SCr of 0.76 mg/dL). Liver & Pancreas: Recent Labs  Lab 06/12/20 0351  AST 31  ALT 19  ALKPHOS 93  BILITOT 0.4  PROT 5.9*  ALBUMIN 1.8*   No results for input(s): LIPASE, AMYLASE in the last 168 hours.  No results for input(s): AMMONIA in the last 168 hours. Diabetic: No results for input(s): HGBA1C in the last 72 hours. No results for input(s): GLUCAP in the last 168 hours. Cardiac Enzymes: No results for input(s): CKTOTAL, CKMB, CKMBINDEX, TROPONINI in the last 168 hours. No results for input(s): PROBNP in the last 8760 hours. Coagulation Profile: No results for input(s): INR, PROTIME in the last 168 hours. Thyroid Function Tests: No results for input(s): TSH, T4TOTAL, FREET4, T3FREE, THYROIDAB in the last 72 hours. Lipid Profile: No results for input(s): CHOL, HDL, LDLCALC, TRIG, CHOLHDL, LDLDIRECT in the last 72 hours. Anemia Panel: No results for input(s): VITAMINB12, FOLATE, FERRITIN, TIBC, IRON, RETICCTPCT in the last 72 hours. Urine analysis:    Component Value Date/Time   COLORURINE AMBER (A) 06/01/2020 0506   APPEARANCEUR HAZY (A) 06/01/2020 0506   LABSPEC 1.025 06/01/2020 0506   PHURINE 5.0 06/01/2020 0506   GLUCOSEU NEGATIVE 06/01/2020 0506   HGBUR NEGATIVE 06/01/2020 0506   BILIRUBINUR NEGATIVE 06/01/2020 0506   KETONESUR NEGATIVE 06/01/2020 0506   PROTEINUR NEGATIVE 06/01/2020 0506   NITRITE NEGATIVE 06/01/2020 0506   LEUKOCYTESUR LARGE (A) 06/01/2020 0506   Sepsis Labs: Invalid input(s): PROCALCITONIN, [place]  Microbiology: Recent Results (from the  past 240 hour(s))  Culture, blood (routine x 2)     Status: None   Collection Time: 06/05/20  7:34 PM   Specimen: BLOOD  Result Value Ref Range Status   Specimen Description BLOOD LEFT ANTECUBITAL  Final   Special Requests   Final    BOTTLES DRAWN AEROBIC AND ANAEROBIC Blood Culture adequate volume   Culture   Final    NO GROWTH 5 DAYS Performed at [facility], [address] [place], [place] [address]    Report Status 06/10/2020 FINAL  Final  Culture, blood (routine x 2)     Status: None   Collection Time: 06/05/20  7:40 PM   Specimen: BLOOD LEFT HAND  Result Value Ref Range Status   Specimen Description BLOOD LEFT HAND  Final   Special Requests   Final    BOTTLES DRAWN AEROBIC ONLY Blood Culture adequate volume   Culture   Final    NO GROWTH 5 DAYS Performed at [facility], [place] [address], [place], [place] [address]    Report Status 06/10/2020 FINAL  Final    Radiology Studies: No results found.    Jewel Mcafee T. [place]  If 7PM-7AM, please contact night-coverage [email or web address] Password [email or web address] 06/13/2020, 1:45 PM

## 2020-06-13 NOTE — Progress Notes (Signed)
   Daily Progress Note   Patient Name: Kenneth Sullivan       Date: 06/13/2020 DOB: 04/24/1956  Age: 64 y.o. MRN#: [id number] Attending Physician: Mercy Riding, MD Primary Care Physician: [facility], [name] Admit Date: 03/07/2020  Reason for Consultation/Follow-up: Establishing goals of care  Subjective: Patient resting in bed. Easily aroused to verbal stimuli. Alert to self only. No acute distress. No family is at the bedside.   Attempted to call niece and offer support in addition to continued goals of care discussion. Unable to reach or leave message.   Length of Stay: 98 days  Vital Signs: BP 132/83   Pulse 99   Temp 98.3 F (36.8 C)   Resp 19   Ht 6\' 2"  (1.88 m)   Wt 80.7 kg   SpO2 97%   BMI 22.85 kg/m  SpO2: SpO2: 97 % O2 Device: O2 Device: Room Air O2 Flow Rate: O2 Flow Rate (L/min): 2 L/min  Intake/output summary:   Intake/Output Summary (Last 24 hours) at 06/13/2020 1448 Last data filed at 06/13/2020 1000 Gross per 24 hour  Intake 818.16 ml  Output --  Net 818.16 ml   LBM:   Baseline Weight: Weight: 72.6 kg Most recent weight: Weight: 80.7 kg  Physical Exam: -NAD, chronically-ill appearing  -normal breathing pattern -alert to self only             Palliative Care Assessment & Plan    Code Status:  DNR   Goals of Care:  Continue with current plan of care per medical team  Attempts made to reach niece for ongoing discussions. She originally shared with my colleague she would be willing to care for patient in the home with hospice support however it seems that she has voiced concerns of not being able to care for patient in the home.   Patient has metastatic cancer with worsening liver metastasis (unable to resume chemo at this point due to poor functional/nutritional state). If niece is in agreement patient could potentially meet criteria for inpatient local hospice facility for EOL care if the goal is for full comfort care measures.    PMT will continue to support and follow   Prognosis: Poor   Discharge Planning: To Be Determined  Thank you for allowing the Palliative Medicine Team to assist in the care of this patient.  Time Total: 20 min.   Visit consisted of counseling and education dealing with the complex and emotionally intense issues of symptom management and palliative care in the setting of serious and potentially life-threatening illness.Greater than 50%  of this time was spent counseling and coordinating care related to the above assessment and plan.  Alda Lea, AGPCNP-BC  Palliative Medicine Team [phone]

## 2020-06-13 NOTE — Progress Notes (Signed)
PT Cancellation Note  Patient Details Name: Kenneth Sullivan MRN: [id number] DOB: [birth year]   Cancelled Treatment:    Reason Eval/Treat Not Completed: Other (comment)  Pt declined PT due to stomach pain and not feeling like getting up or doing exercises.  Did note in chart plan is for hospice at d/c (unsure if residential or facility).  May consider discharging therapy if pt continues to decline participation. Abran Richard, PT Acute Rehab Services Pager [phone] [facility] 06/13/2020, 5:05 PM

## 2020-06-13 NOTE — Care Management (Addendum)
10:58 Attempt to reach niece [name]. No answer. VM "not set up." Fitch,Gloria Niece [phone]  [phone]   Per notes: 6/25 not a candidate for chemo (no longer is a barrier to SNF/ ALF placement) 6/28 Niece states that she cannot provide level of support for care needed for home hospice.    TOC needs to reach niece to discuss SNF/ ALF w palliative care.  [name] is located in [place], [place]. This is over 50 miles. If he were to DC to SNF in [place] family would need to likely pay up front for transportation there. This could be $3,000 or more.   Will continue to try to contact her.   14:15 Called [name] again from an 832 line, she answered. When I introduced myself she interrupted and stated that she was on her way to clients house, was running late and could not talk.  I informed I would not take much time and needed consent to work with nursing homes to try to place him in the area. I informed her about the cost of transportation to [employer] area. She first said that she would come get him, then said that she looked into cost and it was only $300.  She asked for Festus Holts (previous CM) and said she was working on placement in her area, and [name] wanted to talk to her. I explained Festus Holts was not working today, she hung up phone.

## 2020-06-14 NOTE — Progress Notes (Signed)
Occupational Therapy Treatment Patient Details Name: Kenneth Sullivan MRN: [id number] DOB: 28-Jul-1956 Today's Date: 06/14/2020    History of present illness Pt is a 64 y.o. male admitted 03/07/20 with acute SOB; had bradycardic event with asystole/cardiac arrest, ROSC achieved in 20 minutes with 3 rounds epinephrine; intubated 3/23. Pt with massive PE/DVT and RV strain. S/p IR guided mechanical thrombectomy and IVC filter placement. Devoloped gastric ulcer s/p GDA embolization. MRI 3/27 revealed multifocal acute to subacute ischemic infarct involving bilateral cerebral hemisphere consistent with global hypoperfusion; associated scattered petechial hemorrhages with evidence of hemmorhagic conversion in R parietal lobe. S/p trach 4/5, changed to cuffless 4/22; decannulated 4/28. Liver biopsy 5/19. 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 On 06/01/20 - pt with UTI.  On 06/08/20 - pt continues to have a fever, mention of possible hospice in notes if does not improve. PMH unknown.   OT comments  Pt with recent functional decline per chart, but pt new to this therapist and demonstrated some ability to progress with simple tasks this session. Will continue to monitor and assess for functional progress next session. Pt overall Mod A for drinking from cup (recently total A), Min A to wash face and place chapstick on lips with cues to thoroughness needed. Pt focused on tangential conversation throughout OT session with difficulty re-directing, but pt eventually demonstrating ability to sit EOB with Min A and then abruptly return to supine without physical assist. Will continue to follow acutely.    Follow Up Recommendations  SNF;Supervision/Assistance - 24 hour    Equipment Recommendations  Other (comment) (TBD)    Recommendations for Other Services      Precautions / Restrictions Precautions Precautions: Fall Precaution Comments:  impulsive Restrictions Weight Bearing Restrictions: No       Mobility Bed Mobility Overal bed mobility: Needs Assistance Bed Mobility: Supine to Sit;Sit to Supine     Supine to sit: Min assist;HOB elevated Sit to supine: Min guard   General bed mobility comments: Pt Min A to sit EOB with HHA for pt to pull self to EOB. Pt min guard to return to bed umprompted and prior to assessment of further abilities  Transfers Overall transfer level: Needs assistance Equipment used: Rolling walker (2 wheeled);1 person hand held assist Transfers: Sit to/from Stand Sit to Stand: Mod assist;From elevated surface         General transfer comment: unable to assess before pt unexpectedly returned to supine    Balance Overall balance assessment: Needs assistance Sitting-balance support: No upper extremity supported Sitting balance-Leahy Scale: Good     Standing balance support: Single extremity supported;During functional activity Standing balance-Leahy Scale: Poor                             ADL either performed or assessed with clinical judgement   ADL Overall ADL's : Needs assistance/impaired Eating/Feeding: Moderate assistance;Bed level Eating/Feeding Details (indicate cue type and reason): Overall Mod A to drink from cup, demonstrated ability to hold cup when placed in hand and cued  Grooming: Minimal assistance;Sitting;Wash/dry face Grooming Details (indicate cue type and reason): Overall Min A for washing face sitting in bed, applying lip moisturizer with cues to attend to task                                General ADL Comments: Pt  with variations in physical abilities with interference from cognitive abilities. Pt highly distractible but showed ability to follow commands with cues     Vision       Perception     Praxis      Cognition Arousal/Alertness: Awake/alert Behavior During Therapy: Flat affect;Impulsive Overall Cognitive Status:  Impaired/Different from baseline Area of Impairment: Attention;Following commands;Safety/judgement;Awareness;Problem solving;Memory;Orientation                 Orientation Level: Situation;Place Current Attention Level: Sustained Memory: Decreased short-term memory;Decreased recall of precautions Following Commands: Follows one step commands inconsistently Safety/Judgement: Decreased awareness of deficits;Decreased awareness of safety Awareness: Intellectual Problem Solving: Requires tactile cues;Requires verbal cues;Slow processing;Decreased initiation;Difficulty sequencing General Comments: Pt with frequent tangential conversation that is difficult to redirect. Pt talked about the dangers of [employer] and stories of people who have been shot.         Exercises General Exercises - Lower Extremity Long Arc Quad: Both;10 reps;Seated;AAROM   Shoulder Instructions       General Comments Pt with tangential conversation throughout session and with varying abilities to attend to task with consistent cues requirec.     Pertinent Vitals/ Pain       Pain Assessment: Faces Faces Pain Scale: Hurts a little bit Pain Location: generalized Pain Descriptors / Indicators: Grimacing;Guarding Pain Intervention(s): Limited activity within patient's tolerance;Monitored during session;Repositioned  Home Living                                          Prior Functioning/Environment              Frequency  Min 2X/week        Progress Toward Goals  OT Goals(current goals can now be found in the care plan section)  Progress towards OT goals: OT to reassess next treatment  Acute Rehab OT Goals Patient Stated Goal: pt unable OT Goal Formulation: Patient unable to participate in goal setting Time For Goal Achievement: 06/15/20 Potential to Achieve Goals: Poor ADL Goals Pt Will Perform Eating: with min assist;sitting Pt Will Perform Grooming: with min  assist;standing Pt Will Perform Upper Body Bathing: with min assist;sitting Pt Will Perform Lower Body Bathing: with min assist;sit to/from stand Pt Will Perform Upper Body Dressing: with min assist;sitting Pt Will Perform Lower Body Dressing: with min assist;sit to/from stand Pt Will Transfer to Toilet: with min guard assist;ambulating;regular height toilet;grab bars Pt Will Perform Toileting - Clothing Manipulation and hygiene: with min guard assist;sit to/from stand  Plan Discharge plan remains appropriate    Co-evaluation                 AM-PAC OT "6 Clicks" Daily Activity     Outcome Measure   Help from another person eating meals?: A Lot Help from another person taking care of personal grooming?: A Lot Help from another person toileting, which includes using toliet, bedpan, or urinal?: A Lot Help from another person bathing (including washing, rinsing, drying)?: A Lot Help from another person to put on and taking off regular upper body clothing?: A Little Help from another person to put on and taking off regular lower body clothing?: A Lot 6 Click Score: 13    End of Session    OT Visit Diagnosis: Unsteadiness on feet (R26.81);Other abnormalities of gait and mobility (R26.89);Muscle weakness (generalized) (M62.81);Low vision, both eyes (H54.2);Other symptoms and signs involving  cognitive function   Activity Tolerance Other (comment) (limited by cognition)   Patient Left in bed;with call bell/phone within reach;with bed alarm set   Nurse Communication          Time: [phone] OT Time Calculation (min): 31 min  Charges: OT General Charges $OT Visit: 1 Visit OT Treatments $Self Care/Home Management : 8-22 mins $Therapeutic Activity: 8-22 mins  Layla Maw, OTR/L   Layla Maw 06/14/2020, 1:53 PM

## 2020-06-14 NOTE — Progress Notes (Signed)
PROGRESS NOTE    Kenneth Sullivan  YPP:[id number] DOB: 1956/02/14 DOA: 03/07/2020 PCP: Medicine, [name]    Chief Complaint  Patient presents with  . Cardiac Arrest    Brief Narrative:   64 year old gentleman with no prior medical history initially presented to ED with shortness of breath and acute respiratory failure, had a witnessed bradycardic asystolic arrest.  He underwent normothermia protocol and was found to have massive PE and DVT with right ventricular strain.  Hospital course was initially complicated by upper GI bleed due to gastric ulcer.  CT abdomen pelvis showed a large antral ulcer, mass with multiple liver lesions and biopsy confirmed high-grade neuroendocrine tumor.  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  Patient underwent IR guided mechanical thrombectomy for his massive PE and IVC filter.  Postprocedure while on heparin drip patient started having maroon-colored red stool and bloody drainage  from the OGT.  GI consulted and he underwent endoscopy showing gastric ulcer with blood clots.  IR consulted and he underwent GDA embolization. His hospital course was further complicated by multiple acute to subacute ischemic infarcts involving bilateral cerebral hemispheres consistent with global hypoperfusion and also associated with scattered petechial hemorrhages with evidence of hemorrhagic conversion in the parietal lobe. Oncology consulted and initially discussed with patient's adenocarcinoma diagnosis with the patient's niece over the phone recommended chemotherapy versus hospice. Palliative care consulted and discussions are ongoing regarding hospice placement versus SNF.  Assessment & Plan:   Principal Problem:   Cardiac arrest [facility]) Active Problems:   Encounter for central line placement   GI bleed   Acute respiratory failure (HCC)   Anoxic encephalopathy (HCC)   Cerebral embolism with cerebral infarction   Pulmonary emboli ([place])    Palliative care by specialist   Goals of care, counseling/discussion   DNR (do not resuscitate)   Status post tracheostomy ([place])   Protein-calorie malnutrition, severe   Neuroendocrine cancer ([place])   Acute hypoxic respiratory failure in the setting of massive PE/cardiac arrest Status post mechanical ventilation and tracheostomy.  He was subsequently decannulated and Patient is currently on room air.    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 Currently not on any anticoagulation due to GI bleed and CVA with hemorrhagic conversion.   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 With hallucinations and delusions. Continue with Seroquel.   Upper GI bleed due to large malignant gastric ulcer with acute blood loss anemia EGD, revealing adenocarcinoma Embolization of the gastroduodenal artery by IR Continue with PPI.     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 Oncology consulted and recommended that patient is not a candidate for chemotherapy in this current situation.  And oncology has signed off. Palliative care consulted and discussions are ongoing.    Questionable sepsis sepsis secondary to Streptococcus bacteremia Completed 10 days of Unasyn.  Patient remains afebrile but has mild tachycardia and leukocytosis probably secondary to malignancy.    Constipation As needed Senokot.   Hypokalemia Replace  Debility/physical deconditioning PT/OT evaluation.    Severe malnutrition probably secondary to adenocarcinoma, chronic illness multiple liver lesions, metastatic disease    DVT prophylaxis: SCDs Code Status: DNR Family Communication: None at bedside Disposition:   Status is: Inpatient  Remains inpatient appropriate because:Unsafe d/c plan   Dispo: The patient is from: Home              Anticipated d/c is to: Pending  Anticipated d/c date is: > 3 days              Patient currently is medically stable to d/c.       Consultants:   Palliative  Oncology  Neurology  PCCM  Procedures: 3/23>> Admit, EGD with findings of oozing gastric ulcer with clot, IR for mechanical thrombectomy, IVC filter and GDA embolization 4/5>>Trach 4/28>>trach decannulated.   Significant studies: CTA 3/23 >> bilateral PE with RV/with ratio 1.9, emphysema CT head 3/23 >>chronic microvascular changes. No acute findings Echo 3/23 >> RV is severely dilated with reduced function and D-shaped septum, LVEF 60-65%. Venogram 3/23>> occlusive DVT of the iliac vein extending to the lower IVC LE Venous Duplex 3/24 >>acute DVT in BLE up to the femoral vein  MRI Brain 3/28 >> multifocal acute to subacute ischemic infarcts involving bilateral cerebral hemispheres, findings consistent with global hypoperfusion event related to cardiac arrest, associated scattered petechial hemorrhage about many areas of ischemia, evidence of hemorrhagic conversion in the right parietal lobe  CT abdomen and pelvis on 6/19 with increased size of hepatic metastasis with new small liver lesions    Antimicrobials: None   Subjective: No new complaints at this time  Objective: Vitals:   06/13/20 1626 06/13/20 2144 06/14/20 0720 06/14/20 1641  BP: 133/87 115/80 (!) 124/94 128/81  Pulse: (!) 102 (!) 105 98 98  Resp: 19  19 19   Temp: 99.7 F (37.6 C) 99.2 F (37.3 C) 99 F (37.2 C) 98.7 F (37.1 C)  TempSrc:  Oral    SpO2: 98% 92% 100% 95%  Weight:      Height:       No intake or output data in the 24 hours ending 06/14/20 1724 Filed Weights   05/31/20 0500 06/01/20 0254 06/11/20 0513  Weight: 63.5 kg 66.7 kg 80.7 kg    Examination:  General exam: Appears calm and comfortable  Respiratory system: Clear to auscultation. Respiratory effort normal. Cardiovascular system: S1 & S2 heard, RRR. No JVD, No pedal edema. Gastrointestinal  system: Abdomen is nondistended, soft and nontender. Normal bowel sounds heard. Central nervous system: Alert oriented to self Extremities: No pedal edema Skin: No rashes, lesions or ulcers Psychiatry: Cannot be assessed    Data Reviewed: I have personally reviewed following labs and imaging studies  CBC: Recent Labs  Lab 06/08/20 0327 06/09/20 0751  WBC 15.3* 16.7*  NEUTROABS 12.3* 13.7*  HGB 7.2* 8.1*  HCT 23.1* 26.5*  MCV 87.2 88.6  PLT 446* 604*    Basic Metabolic Panel: Recent Labs  Lab 06/12/20 0351  NA 144  K 3.3*  CL 110  CO2 23  GLUCOSE 98  BUN 6*  CREATININE 0.76  CALCIUM 8.3*  MG 1.9    GFR: Estimated Creatinine Clearance: 109.3 mL/min (by C-G formula based on SCr of 0.76 mg/dL).  Liver Function Tests: Recent Labs  Lab 06/12/20 0351  AST 31  ALT 19  ALKPHOS 93  BILITOT 0.4  PROT 5.9*  ALBUMIN 1.8*    CBG: No results for input(s): GLUCAP in the last 168 hours.   Recent Results (from the past 240 hour(s))  Culture, blood (routine x 2)     Status: None   Collection Time: 06/05/20  7:34 PM   Specimen: BLOOD  Result Value Ref Range Status   Specimen Description BLOOD LEFT ANTECUBITAL  Final   Special Requests   Final    BOTTLES DRAWN AEROBIC AND ANAEROBIC Blood Culture adequate volume   Culture  Final    NO GROWTH 5 DAYS Performed at [facility], [place] [address], [place], [place] [address]    Report Status 06/10/2020 FINAL  Final  Culture, blood (routine x 2)     Status: None   Collection Time: 06/05/20  7:40 PM   Specimen: BLOOD LEFT HAND  Result Value Ref Range Status   Specimen Description BLOOD LEFT HAND  Final   Special Requests   Final    BOTTLES DRAWN AEROBIC ONLY Blood Culture adequate volume   Culture   Final    NO GROWTH 5 DAYS Performed at [facility], [place] [address], [place], [place] [address]    Report Status 06/10/2020 FINAL  Final         Radiology Studies: No results  found.      Scheduled Meds: . clonazePAM  0.5 mg Oral BID  . feeding supplement (ENSURE ENLIVE)  237 mL Oral QID  . ferrous sulfate  325 mg Oral Q breakfast  . FLUoxetine  10 mg Oral QHS  . OXcarbazepine  75 mg Oral BID  . pantoprazole  40 mg Oral Daily  . polyethylene glycol  17 g Oral BID  . QUEtiapine  100 mg Oral QHS  . QUEtiapine  75 mg Oral Daily  . senna-docusate  1 tablet Oral BID   Continuous Infusions: . sodium chloride    . sodium chloride 50 mL/hr at 06/12/20 1852     LOS: 19 days       Hosie Poisson, MD [facility]   To contact the attending provider between 7A-7P or the covering provider during after hours 7P-7A, please log into the web site [email or web address] and access using universal [place] password for that web site. If you do not have the password, please call the hospital operator.  06/14/2020, 5:24 PM

## 2020-06-14 NOTE — Progress Notes (Signed)
Physical Therapy Treatment Patient Details Name: Kenneth Sullivan MRN: [id number] DOB: May 19, 1956 Today's Date: 06/14/2020    History of Present Illness Pt is a 64 y.o. male admitted 03/07/20 with acute SOB; had bradycardic event with asystole/cardiac arrest, ROSC achieved in 20 minutes with 3 rounds epinephrine; intubated 3/23. Pt with massive PE/DVT and RV strain. S/p IR guided mechanical thrombectomy and IVC filter placement. Devoloped gastric ulcer s/p GDA embolization. MRI 3/27 revealed multifocal acute to subacute ischemic infarct involving bilateral cerebral hemisphere consistent with global hypoperfusion; associated scattered petechial hemorrhages with evidence of hemmorhagic conversion in R parietal lobe. S/p trach 4/5, changed to cuffless 4/22; decannulated 4/28. Liver biopsy 5/19. 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 On 06/01/20 - pt with UTI.  On 06/08/20 - pt continues to have a fever, mention of possible hospice in notes if does not improve. PMH unknown.    PT Comments    Pt soiled in urine upon PT arrival to room, pt asking PT to "get me out of here (bed)". Pt required moderate assistance from PT for mobility today, tolerating short distance ambulation. Pt does better with use of HHA vs use of RW, as RW tends to confuse pt. PT assisted pt with pericare and changing gowns due to urinary incontinence, pt unable to assist with washing up. PT to continue to recommend SNF level of care post-acutely, will continue to follow.    Follow Up Recommendations  Supervision/Assistance - 24 hour;SNF     Equipment Recommendations  Wheelchair cushion (measurements PT);Wheelchair (measurements PT);3in1 (PT)    Recommendations for Other Services       Precautions / Restrictions Precautions Precautions: Fall Precaution Comments: impulsive Restrictions Weight Bearing Restrictions: No    Mobility  Bed Mobility Overal bed  mobility: Needs Assistance Bed Mobility: Supine to Sit;Sit to Supine     Supine to sit: Mod assist;HOB elevated Sit to supine: Min assist;HOB elevated   General bed mobility comments: Mod assist for supine to sit for trunk and LE management, pt reaching hands out for PT to assist. Min assist for return to supine for trunk lowering, positioning, and LE lifting into bed.  Transfers Overall transfer level: Needs assistance Equipment used: Rolling walker (2 wheeled);1 person hand held assist Transfers: Sit to/from Stand Sit to Stand: Mod assist;From elevated surface         General transfer comment: Mod assist for power up, hip extension to neutral, and steadying; requires significant hip guiding and slow eccentric lower with PT assist to sit on destination surface. Sit to stand x2 from EOB and from chair, Pt with improved form during standing without use of RW, RW appeared to confuse pt.  Ambulation/Gait Ambulation/Gait assistance: Mod assist Gait Distance (Feet): 10 Feet (2x5 ft) Assistive device: 1 person hand held assist Gait Pattern/deviations: Step-through pattern;Decreased stride length;Shuffle;Wide base of support;Trunk flexed Gait velocity: decr   General Gait Details: Mod assist for steadying, correcting LOB and pt attempting to sit down mid-ambulation on the way to recliner. PT called for +2 help, as pt not following cues to maintain stand.   Stairs             Wheelchair Mobility    Modified Rankin (Stroke Patients Only) Modified Rankin (Stroke Patients Only) Pre-Morbid Rankin Score: No symptoms Modified Rankin: Moderately severe disability     Balance Overall balance assessment: Needs assistance Sitting-balance support: No upper extremity supported Sitting balance-Leahy Scale: Good     Standing  balance support: Single extremity supported;During functional activity Standing balance-Leahy Scale: Poor                              Cognition  Arousal/Alertness: Awake/alert Behavior During Therapy: Flat affect;Impulsive Overall Cognitive Status: Impaired/Different from baseline Area of Impairment: Attention;Following commands;Safety/judgement;Awareness;Problem solving;Memory;Orientation                 Orientation Level: Disoriented to;Situation;Time;Place Current Attention Level: Sustained Memory: Decreased short-term memory;Decreased recall of precautions Following Commands: Follows one step commands inconsistently Safety/Judgement: Decreased awareness of deficits;Decreased awareness of safety Awareness: Intellectual Problem Solving: Requires tactile cues;Requires verbal cues;Slow processing;Decreased initiation;Difficulty sequencing General Comments: Pt pointing around the room calling PT's attention to "white roaches" which are chips of paint. Pt telling PT throughout session about celebrities who are in jail (who are not in jail) . Pt requires frequent multimodal cuing for safety during mobility, pt at one point attempting to sit down during ambulation with no chair behind him (+2 for safety for this reason).      Exercises General Exercises - Lower Extremity Long Arc Quad: Both;10 reps;Seated;AAROM    General Comments        Pertinent Vitals/Pain Pain Assessment: Faces Faces Pain Scale: Hurts little more Pain Location: generalized during mobility Pain Descriptors / Indicators: Grimacing;Guarding Pain Intervention(s): Limited activity within patient's tolerance;Monitored during session;Repositioned    Home Living                      Prior Function            PT Goals (current goals can now be found in the care plan section) Acute Rehab PT Goals Patient Stated Goal: pt unable PT Goal Formulation: With patient Time For Goal Achievement: 06/16/20 Potential to Achieve Goals: Poor Progress towards PT goals: Progressing toward goals    Frequency    Min 2X/week      PT Plan Current plan  remains appropriate    Co-evaluation              AM-PAC PT "6 Clicks" Mobility   Outcome Measure  Help needed turning from your back to your side while in a flat bed without using bedrails?: A Little Help needed moving from lying on your back to sitting on the side of a flat bed without using bedrails?: A Lot Help needed moving to and from a bed to a chair (including a wheelchair)?: A Lot Help needed standing up from a chair using your arms (e.g., wheelchair or bedside chair)?: A Lot Help needed to walk in hospital room?: A Lot Help needed climbing 3-5 steps with a railing? : Total 6 Click Score: 12    End of Session Equipment Utilized During Treatment: Gait belt Activity Tolerance: Other (comment);Patient limited by fatigue (confusion) Patient left: with call bell/phone within reach;in bed;with bed alarm set Nurse Communication: Mobility status PT Visit Diagnosis: Unsteadiness on feet (R26.81);Other abnormalities of gait and mobility (R26.89)     Time: 8850-2774 PT Time Calculation (min) (ACUTE ONLY): 21 min  Charges:  $Therapeutic Activity: 8-22 mins                    Farra Nikolic E, PT Acute Rehabilitation Services Pager [phone]  Office [phone]   Kesa Birky D Jimi Schappert 06/14/2020, 1:00 PM

## 2020-06-15 ENCOUNTER — Encounter (HOSPITAL_COMMUNITY): Payer: Self-pay | Admitting: Oncology

## 2020-06-15 MED ORDER — POTASSIUM CHLORIDE CRYS ER 20 MEQ PO TBCR
20.0000 meq | EXTENDED_RELEASE_TABLET | Freq: Every day | ORAL | Status: DC
  Administered 2020-06-15 – 2020-07-03 (×19): 20 meq via ORAL
  Filled 2020-06-15 (×19): qty 1

## 2020-06-15 NOTE — Progress Notes (Signed)
   Daily Progress Note   Patient Name: Kenneth Sullivan       Date: 06/15/2020 DOB: 02/23/1956  Age: 64 y.o. MRN#: [id number] Attending Physician: Hosie Poisson, MD Primary Care Physician: Medicine, [name] Admit Date: 03/07/2020  Reason for Consultation/Follow-up: Establishing goals of care  Patient resting comfortably. Easily aroused on verbal stimuli. Denies pain or shortness of breath. Alert to self. Will follow simple commands. Po intake remains poor. No family is at the bedside.   Multiple attempts this week to contact niece, Kenneth Sullivan for continued discussions. Initially someone answered cell phone number that is listed and immediately hung up. Upon return I did not receive and answer and there is not a voicemail set-up on her mobile number.   Palliative Care Assessment & Plan    Code Status:  DNR   Goals of Care:  Unable to speak with niece despite multiple attempts throughout the week. Patient remains hospitalized due to unclear/safe discharge plan.   Will continue to contact family for ongoing discussions  Thank you for allowing the Palliative Medicine Team to assist in the care of this patient.  Time Total: 20 min.   Visit consisted of counseling and education dealing with the complex and emotionally intense issues of symptom management and palliative care in the setting of serious and potentially life-threatening illness.Greater than 50%  of this time was spent counseling and coordinating care related to the above assessment and plan.  Alda Lea, AGPCNP-BC  Palliative Medicine Team [phone]

## 2020-06-15 NOTE — Progress Notes (Signed)
Nutrition Follow-up  DOCUMENTATION CODES:   Underweight, Severe malnutrition in context of chronic illness  INTERVENTION:   Unable to place Cortrak given severity of gastric cancer.   No BM in one week despite bowel regimen   Feeding assistance with meals and snacks  Ensure Enlive poQID, each supplement provides 350 kcal and 20 grams of protein  MagicCup TID with meals, each supplement provides 290 kcal and 9 grams of protein  NUTRITION DIAGNOSIS:   Severe Malnutrition related to chronic illness (gastric lesion, multiple liver lesions, concern for metastatic disease) as evidenced by severe fat depletion, severe muscle depletion.  Ongoing  GOAL:   Patient will meet greater than or equal to 90% of their needs  Not meeting  MONITOR:   PO intake, Supplement acceptance, Labs, Weight trends  REASON FOR ASSESSMENT:   Ventilator    ASSESSMENT:   64 yo male admitted S/P cardiac arrest, S/P normothermia protocol. Found to have massive PE, DVT, GI bleed from gastric ulcer. PMH includes current smoker, heavy alcohol use.  4/02 - trach  4/09 - Cortrak placed  4/11- pt pulled out trach, replaced by RT,TF held due to Cortrak leaking  4/13 - MBS, Dysphagia 1 with thin liquids started 4/14 - Cortrak removed 4/16 - Cortrak replaced 4/22 - Cortrak clogged, replaced by diagnostic radiology (tip in pre-pyloric region of stomach) 4/25 - diet advanced to Dysphagia 2 4/27 - tube feeds changed to nocturnal 4/28 - decannulated 4/30 - pulled Cortrak, TF stopped 5/03 - diet upgraded to Dysphagia 3 5/05 - diet upgraded to Regular 5/19 - s/p liver lesion biopsy by IR 5/28- EGD reveals gastric adenocarcinoma  Intake remains inadequate x 1.5 weeks given AMS. Discussed feeding options with internal medicine. Gastric mass extends from gastric antrum to pylorus. Do not suspect pt will tolerate Cortrak place with EN. Social work/palliative trying to reach family to discuss [place] but have  not been successful. If aggressive care desired TPN would be last option which is not a long term solution. Will continue with current supplements until further GOC are determined.   Admission weight: 73.6 kg  Current weight: 80.7 kg (question accuracy)   Drips: NS @ 50 ml/hr  Medications: ferrous sulfate, miralax, 20 mEq KCl daily, senokot  Labs: K 3.3 (L)   Diet Order:   Diet Order            Diet regular Room service appropriate? No; Fluid consistency: Thin  Diet effective now                 EDUCATION NEEDS:   Not appropriate for education at this time  Skin:  Skin Assessment: Reviewed RN Assessment Skin Integrity Issues:: Other (Comment) Other: puncture neck, groin  Last BM:  6/23  Height:   Ht Readings from Last 1 Encounters:  04/29/20 6\' 2"  (1.88 m)    Weight:   Wt Readings from Last 1 Encounters:  06/11/20 80.7 kg    Ideal Body Weight:  86.4 kg  BMI:  Body mass index is 22.85 kg/m.  Estimated Nutritional Needs:   Kcal:  2100-2300  Protein:  110-125 gm  Fluid:  >/= 2 L   Mariana Single RD, LDN Clinical Nutrition Pager listed in [place]

## 2020-06-15 NOTE — Progress Notes (Addendum)
[facility] PROGRESS NOTE  Kenneth Sullivan KDT:[id number] DOB: 1956/09/28 DOA: 03/07/2020 PCP: [facility], [name]  Status is: Inpatient  Remains inpatient appropriate because:Altered mental status, Unsafe d/c plan and Inpatient level of care appropriate due to severity of illness   Dispo: The patient is from: Home              Anticipated d/c is to: Hospice for palliative medicine versus SNF              Anticipated d/c date is: > 3 days              Patient currently is medically stable to d/c.  HPI: 64 year old gentleman with no prior medical history initially presented to ED with shortness of breath and acute respiratory failure, had a witnessed bradycardic asystolic arrest.  He underwent normothermia protocol and was found to have massive PE and DVT with right ventricular strain.  Hospital course was initially complicated by upper GI bleed due to gastric ulcer.  CT abdomen pelvis showed a large antral ulcer, mass with multiple liver lesions and biopsy confirmed high-grade neuroendocrine tumor.  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  Patient underwent IR guided mechanical thrombectomy for his massive PE and IVC filter.  Postprocedure while on heparin drip patient started having maroon-colored red stool and bloody drainage  from the OGT.  GI consulted and he underwent endoscopy showing gastric ulcer with blood clots.  IR consulted and he underwent GDA embolization.  Hospital course was further complicated by multiple acute to subacute ischemic infarcts involving bilateral cerebral hemispheres consistent with global hypoperfusion and also associated with scattered petechial hemorrhages with evidence of hemorrhagic conversion in the parietal lobe.  Oncology consulted and initially discussed with patient's adenocarcinoma diagnosis with the patient's niece over the phone recommended chemotherapy versus hospice. Palliative care consulted and discussions  are ongoing regarding hospice placement versus SNF.  Subjective: Awakens from sleep.  Soft-spoken.  No specific complaints.  Did not have any questions for me.  Objective: Vitals:   06/14/20 2007 06/15/20 0830  BP: (!) 133/97 126/83  Pulse: (!) 101 93  Resp:  18  Temp: 98.3 F (36.8 C) 98.1 F (36.7 C)  SpO2: 93% 100%    Intake/Output Summary (Last 24 hours) at 06/15/2020 1342 Last data filed at 06/15/2020 0900 Gross per 24 hour  Intake 240 ml  Output --  Net 240 ml   Filed Weights   05/31/20 0500 06/01/20 0254 06/11/20 0513  Weight: 63.5 kg 66.7 kg 80.7 kg    Exam: Constitutional: NAD, calm, appears comfortable ENMT: Mucous membranes are moist.Normal dentition.  Respiratory: clear to auscultation bilaterally, no wheezing, no crackles. Normal respiratory effort.  Room air Cardiovascular: Regular rate and rhythm, no murmurs / rubs / gallops. No extremity edema. 2+ pedal pulses. No carotid bruits.  Abdomen: no tenderness, no masses palpated. No hepatosplenomegaly. Bowel sounds positive.  Musculoskeletal: no clubbing / cyanosis. No joint deformity upper and lower extremities. Good ROM, no contractures.  Skin: no rashes, lesions, ulcers. No induration Neurologic: CN 2-12 grossly intact. Sensation intact, DTR normal. Strength 4/5 x all 4 extremities.  Psychiatric: Alert, oriented to,?  Place   Assessment/Plan: Acute hypoxic respiratory failure in the setting of massive PE/cardiac arrest Status post mechanical ventilation and tracheostomy.   Now decannulated and stable on room air.  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 Currently not on any anticoagulation due  to GI bleed and CVA with hemorrhagic conversion.  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 Unable to safely return home and live independently; remains confused and continues to have issues with delusions and delusions Continue  with Seroquel and Prozac  Upper GI bleed due to large malignant gastric ulcer with acute blood loss anemia EGD: adenocarcinoma with neuroendocrine component  Also with mets to the liver Embolization of the gastroduodenal artery by IR Oncology consulted and determined patient not an appropriate candidate for chemotherapy due to current medical and psychological comorbidities-they have signed off Palliative care discussions are ongoing with patient's niece Continue with PPI.  Questionable sepsis 2/2 Streptococcus bacteremia Sepsis physiology has resolved Completed 10 days of Unasyn.   Patient remains afebrile but has mild tachycardia and leukocytosis probably secondary to malignancy and ongoing malnutrition  Constipation As needed Senokot.  Acute hypokalemia Continues to have lower than expected potassium readings ranging from 3.1-3.8 Begin potassium 20 mEq p.o. daily and follow lab Magnesium 1.9  Debility/physical deconditioning PT: Continues to require moderate assistance from PT for mobility, tolerating short distance ambulation.  Patient tolerated HHA versus rolling walker as rolling walker tends to confuse patient.  Patient unable to assist with cleansing after urinary incontinence.  PT recommending SNF/24-hour supervision OT: Moderate assist for drinking from cup apparently has improved from total assist.  Minimal assist to wash face in place Chapstick on lips with cues throughout.  Noted with difficulty redirecting with 24-hour supervision/SNF recommended  Severe malnutrition probably secondary to adenocarcinoma, chronic illness multiple liver lesions, metastatic disease Nutrition Status: Nutrition Problem: Severe Malnutrition Etiology: chronic illness (gastric lesion, multiple liver lesions, concern for metastatic disease) Signs/Symptoms: severe fat depletion, severe muscle depletion Interventions: Ensure Enlive (each supplement provides 350kcal and 20 grams of protein),  Magic cup .Gastric mass extends from antrum into prepyloric area and into pylorus and probably in the Duodenal Bulb. I was not able to see the duodenal bulb clearly.  Unlikely patient will be able to eat appropriately again given degree of cancer invasion and likely would benefit more from comfort feeds    Data Reviewed: Basic Metabolic Panel: Recent Labs  Lab 06/12/20 0351  NA 144  K 3.3*  CL 110  CO2 23  GLUCOSE 98  BUN 6*  CREATININE 0.76  CALCIUM 8.3*  MG 1.9   Liver Function Tests: Recent Labs  Lab 06/12/20 0351  AST 31  ALT 19  ALKPHOS 93  BILITOT 0.4  PROT 5.9*  ALBUMIN 1.8*   No results for input(s): LIPASE, AMYLASE in the last 168 hours. No results for input(s): AMMONIA in the last 168 hours. CBC: Recent Labs  Lab 06/09/20 0751  WBC 16.7*  NEUTROABS 13.7*  HGB 8.1*  HCT 26.5*  MCV 88.6  PLT 604*   Cardiac Enzymes: No results for input(s): CKTOTAL, CKMB, CKMBINDEX, TROPONINI in the last 168 hours. BNP (last 3 results) Recent Labs    03/07/20 0710  BNP 74.6    ProBNP (last 3 results) No results for input(s): PROBNP in the last 8760 hours.  CBG: No results for input(s): GLUCAP in the last 168 hours.  Recent Results (from the past 240 hour(s))  Culture, blood (routine x 2)     Status: None   Collection Time: 06/05/20  7:34 PM   Specimen: BLOOD  Result Value Ref Range Status   Specimen Description BLOOD LEFT ANTECUBITAL  Final   Special Requests   Final    BOTTLES DRAWN AEROBIC AND ANAEROBIC Blood Culture adequate volume  Culture   Final    NO GROWTH 5 DAYS Performed at [facility], [place] [address] [place], [place] [address]    Report Status 06/10/2020 FINAL  Final  Culture, blood (routine x 2)     Status: None   Collection Time: 06/05/20  7:40 PM   Specimen: BLOOD LEFT HAND  Result Value Ref Range Status   Specimen Description BLOOD LEFT HAND  Final   Special Requests   Final    BOTTLES DRAWN AEROBIC ONLY Blood Culture  adequate volume   Culture   Final    NO GROWTH 5 DAYS Performed at [facility], [place] [address], [place], [place] [address]    Report Status 06/10/2020 FINAL  Final     Studies: No results found.  Scheduled Meds: . clonazePAM  0.5 mg Oral BID  . feeding supplement (ENSURE ENLIVE)  237 mL Oral QID  . ferrous sulfate  325 mg Oral Q breakfast  . FLUoxetine  10 mg Oral QHS  . OXcarbazepine  75 mg Oral BID  . pantoprazole  40 mg Oral Daily  . polyethylene glycol  17 g Oral BID  . QUEtiapine  100 mg Oral QHS  . QUEtiapine  75 mg Oral Daily  . senna-docusate  1 tablet Oral BID   Continuous Infusions: . sodium chloride    . sodium chloride 50 mL/hr at 06/15/20 0806    Principal Problem:   Cardiac arrest [facility]) Active Problems:   Encounter for central line placement   GI bleed   Acute respiratory failure (HCC)   Anoxic encephalopathy (HCC)   Cerebral embolism with cerebral infarction   Pulmonary emboli ([place])   Palliative care by specialist   Goals of care, counseling/discussion   DNR (do not resuscitate)   Status post tracheostomy ([place])   Protein-calorie malnutrition, severe   Neuroendocrine cancer ([place])     Code Status: DNR Family Communication: No family at bedside   Consultants:  Palliative medicine team  Oncology  PCCM  Procedures:  Echocardiogram  EEG  EGD  Cortrack feeding tube  Antibiotics: Anti-infectives (From admission, onward)   Start     Dose/Rate Route Frequency Ordered Stop   06/02/20 1630  Ampicillin-Sulbactam (UNASYN) 3 g in sodium chloride 0.9 % 100 mL IVPB        3 g 200 mL/hr over 30 Minutes Intravenous Every 6 hours 06/02/20 1626 06/10/20 2215   06/02/20 0645  cefTRIAXone (ROCEPHIN) 2 g in sodium chloride 0.9 % 100 mL IVPB  Status:  Discontinued        2 g 200 mL/hr over 30 Minutes Intravenous Daily 06/02/20 0622 06/02/20 1607   06/01/20 0715  cefTRIAXone (ROCEPHIN) 1 g in sodium chloride 0.9 % 100 mL IVPB  Status:   Discontinued        1 g 200 mL/hr over 30 Minutes Intravenous Every 24 hours 06/01/20 0710 06/02/20 0622   03/26/20 1100  doxycycline (VIBRAMYCIN) 100 mg in sodium chloride 0.9 % 250 mL IVPB  Status:  Discontinued        100 mg 125 mL/hr over 120 Minutes Intravenous 2 times daily 03/26/20 1002 04/01/20 1035   03/25/20 1100  doxycycline (VIBRA-TABS) tablet 100 mg  Status:  Discontinued        100 mg Per Tube Every 12 hours 03/25/20 1009 03/26/20 1002   03/25/20 1015  doxycycline (VIBRAMYCIN) 100 mg in sodium chloride 0.9 % 250 mL IVPB  Status:  Discontinued  Note to Pharmacy: To be given after the tracheal aspirate is collected.   100 mg 125 mL/hr over 120 Minutes Intravenous Every 12 hours 03/25/20 1005 03/25/20 1008        Time spent: 22 minutes    Erin Hearing  [facility] Pager [phone]. If 7PM-7AM, please contact night-coverage at [email or web address] 06/15/2020, 1:42 PM  LOS: 100 days

## 2020-06-15 NOTE — TOC Progression Note (Signed)
Transition of Care [facility]) - Progression Note    Patient Details  Name: Kenneth Sullivan MRN: [id number] Date of Birth: [birth year]  Transition of Care [facility]) CM/SW [place], RN Phone Number: [phone]  06/15/2020, 2:42 PM  Clinical Narrative:    CM spoke with niece [name] who expresses that she wants patient placed close to her in [place], [place], [place]. [place] or [place]. CM explained to niece that search would be extended to those areas. Niece states that she has researched the cost for transport and it would cost $300. Niece also states that she would transport patient to facility. CM to initiate search in cities listed above.     Expected Discharge Plan: Skilled Nursing Facility Barriers to Discharge: No SNF bed  Expected Discharge Plan and Services Expected Discharge Plan: [place] In-house Referral: Clinical Social Work     Living arrangements for the past 2 months: Apartment                                       Social Determinants of Health (SDOH) Interventions    Readmission Risk Interventions No flowsheet data found.

## 2020-06-16 ENCOUNTER — Encounter (HOSPITAL_COMMUNITY): Payer: Self-pay | Admitting: Oncology

## 2020-06-16 MED ORDER — WHITE PETROLATUM EX OINT
TOPICAL_OINTMENT | CUTANEOUS | Status: AC
  Administered 2020-06-16: 1
  Filled 2020-06-16: qty 28.35

## 2020-06-16 NOTE — Progress Notes (Signed)
Palliative Medicine RN Note: Discussed pt in team rounds with providers, including attending MD and medical director.   Our team has worked with Kenneth Sullivan family for almost 3 months on [place]. His niece has told our team last week that she wanted to take Kenneth Sullivan home, but she told [facility] team that she is now unable to do so due to working multiple jobs. Our team does not feel that he meets criteria for residential hospice at this time.  Unfortunately, our team is no longer able to reach his niece, who has now said she cannot take him home. She agrees with hospice care, but placement and location of care are a concern, as he has Medicaid. We are unable to assist with disposition/insurance barriers. The GOC are clear.  Kenneth Sullivan is appropriate for home hospice, whether he is receiving it at home or at a long term nursing facility.   We will sign off at this time and will place order for [facility] to set up hospice to follow pt at discharge.   Please call our office if new concerns arise.  Marjie Skiff Jasiri Hanawalt, RN, BSN, [facility] Palliative Medicine Team 06/16/2020 9:51 AM Office [phone]

## 2020-06-16 NOTE — Progress Notes (Signed)
[facility] PROGRESS NOTE  Kenneth Sullivan WJX:[id number] DOB: 1956/03/07 DOA: 03/07/2020 PCP: Medicine, [name]  Status is: Inpatient  Remains inpatient appropriate because:Altered mental status, Unsafe d/c plan and Inpatient level of care appropriate due to severity of illness   Dispo: The patient is from: Home              Anticipated d/c is to: Hospice for palliative medicine versus SNF              Anticipated d/c date is: > 3 days              Patient currently is medically stable to d/c.  **As of 7/2 there are no beds available at [facility] area SNF facilities **Palliative care documents that patient is appropriate for home hospice but niece who would be primary caretaker is unable to take the patient home as she is working multiple jobs.  She continues to agree with hospice care but placement and location of care remain difficult since patient has Medicaid.  HPI: 64 year old gentleman with no prior medical history initially presented to ED with shortness of breath and acute respiratory failure, had a witnessed bradycardic asystolic arrest.  He underwent normothermia protocol and was found to have massive PE and DVT with right ventricular strain.  Hospital course was initially complicated by upper GI bleed due to gastric ulcer.  CT abdomen pelvis showed a large antral ulcer, mass with multiple liver lesions and biopsy confirmed high-grade neuroendocrine tumor.  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  Patient underwent IR guided mechanical thrombectomy for his massive PE and IVC filter.  Postprocedure while on heparin drip patient started having maroon-colored red stool and bloody drainage  from the OGT.  GI consulted and he underwent endoscopy showing gastric ulcer with blood clots.  IR consulted and he underwent GDA embolization.  Hospital course was further complicated by multiple acute to subacute ischemic infarcts involving bilateral cerebral  hemispheres consistent with global hypoperfusion and also associated with scattered petechial hemorrhages with evidence of hemorrhagic conversion in the parietal lobe.  Oncology consulted and initially discussed with patient's adenocarcinoma diagnosis with the patient's niece over the phone recommended chemotherapy versus hospice. Palliative care consulted and niece agrees with hospice care.  Unfortunately she is now working multiple jobs unable to accommodate taking patient home for home hospice.  Placement has been difficult given patient having Medicare and lack of available beds.  Subjective: Very alert.  Denies pain.  Remains confused not hungry.  Objective: Vitals:   06/15/20 2347 06/16/20 0735  BP: 120/81 (!) 117/94  Pulse: 96 97  Resp: 18 16  Temp: 98.3 F (36.8 C) 97.9 F (36.6 C)  SpO2: 98% 97%    Intake/Output Summary (Last 24 hours) at 06/16/2020 1124 Last data filed at 06/16/2020 1030 Gross per 24 hour  Intake --  Output 400 ml  Net -400 ml   Filed Weights   05/31/20 0500 06/01/20 0254 06/11/20 0513  Weight: 63.5 kg 66.7 kg 80.7 kg    Exam: Constitutional: NAD, calm, appears comfortable ENMT: Mucous membranes are moist.Normal dentition.  Respiratory: clear to auscultation bilaterally, Normal respiratory effort.  Room air Cardiovascular: Regular rate and rhythm, no murmurs / rubs / gallops. No extremity edema. 2+ pedal pulses.   Abdomen: no tenderness, no masses palpated. No hepatosplenomegaly. Bowel sounds positive.  Poor oral intake documented Musculoskeletal: no clubbing / cyanosis. No joint deformity upper and lower extremities. Good ROM, no contractures.  Skin: no rashes,  lesions, ulcers. No induration Neurologic: CN 2-12 grossly intact. Sensation intact, DTR normal. Strength 4/5 x all 4 extremities.  Psychiatric: Alert, oriented to name only   Assessment/Plan: Acute hypoxic respiratory failure in the setting of massive PE/cardiac arrest Status post  mechanical ventilation and tracheostomy.   Now decannulated and stable on room air.  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 Currently not on any anticoagulation due to GI bleed and CVA with hemorrhagic conversion.  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 Due to location of gastric mass it is not unexpected that patient unable to eat-recommendation is for easy to digest foods with focus on high-protein beverages and shakes  Questionable sepsis 2/2 Streptococcus bacteremia Sepsis physiology has resolved Completed 10 days of Unasyn.   Patient remains afebrile but has mild tachycardia and leukocytosis probably secondary to malignancy and ongoing malnutrition  Constipation As needed Senokot.  Acute hypokalemia Continues to have lower than expected potassium readings ranging from 3.1-3.8 Begin potassium 20 mEq p.o. daily and follow lab Magnesium 1.9  Debility/physical deconditioning PT: Continues to require moderate assistance from PT for mobility, tolerating short distance ambulation.  Patient tolerated HHA versus rolling walker as rolling walker tends to confuse patient.  Patient unable to assist with cleansing after urinary incontinence.  PT recommending SNF/24-hour supervision OT: Moderate assist for drinking from  cup apparently has improved from total assist.  Minimal assist to wash face in place Chapstick on lips with cues throughout.  Noted with difficulty redirecting with 24-hour supervision/SNF recommended  Severe malnutrition probably secondary to adenocarcinoma, chronic illness multiple liver lesions, metastatic disease Nutrition Status: Nutrition Problem: Severe Malnutrition Etiology: chronic illness (gastric lesion, multiple liver lesions, concern for metastatic disease) Signs/Symptoms: severe fat depletion, severe muscle depletion Interventions: Ensure Enlive (each supplement provides 350kcal and 20 grams of protein), Magic cup .Gastric mass extends from antrum into prepyloric area and into pylorus and probably in the Duodenal Bulb. I was not able to see the duodenal bulb clearly.  Unlikely patient will be able to eat appropriately again given degree of cancer invasion and likely would benefit more from comfort feeds-will be on easy to digest food and high-protein beverages and shakes    Data Reviewed: Basic Metabolic Panel: Recent Labs  Lab 06/12/20 0351  NA 144  K 3.3*  CL 110  CO2 23  GLUCOSE 98  BUN 6*  CREATININE 0.76  CALCIUM 8.3*  MG 1.9   Liver Function Tests: Recent Labs  Lab 06/12/20 0351  AST 31  ALT 19  ALKPHOS 93  BILITOT 0.4  PROT 5.9*  ALBUMIN 1.8*   No results for input(s): LIPASE, AMYLASE in the last 168 hours. No results for input(s): AMMONIA in the last 168 hours. CBC: No results for input(s): WBC, NEUTROABS, HGB, HCT, MCV, PLT in the last 168 hours. Cardiac Enzymes: No results for input(s): CKTOTAL, CKMB, CKMBINDEX, TROPONINI in the last 168 hours. BNP (last 3  results) Recent Labs    03/07/20 0710  BNP 74.6    ProBNP (last 3 results) No results for input(s): PROBNP in the last 8760 hours.  CBG: No results for input(s): GLUCAP in the last 168 hours.  No results found for this or any previous visit (from the past 240 hour(s)).    Studies: No results found.  Scheduled Meds: . clonazePAM  0.5 mg Oral BID  . feeding supplement (ENSURE ENLIVE)  237 mL Oral QID  . ferrous sulfate  325 mg Oral Q breakfast  . FLUoxetine  10 mg Oral QHS  . OXcarbazepine  75 mg Oral BID  . pantoprazole  40 mg Oral Daily  . polyethylene glycol  17 g Oral BID  . potassium chloride  20 mEq Oral Daily  . QUEtiapine  100 mg Oral QHS  . QUEtiapine  75 mg Oral Daily  . senna-docusate  1 tablet Oral BID   Continuous Infusions: . sodium chloride    . sodium chloride 50 mL/hr at 06/16/20 4315    Principal Problem:   Cardiac arrest [facility]) Active Problems:   Encounter for central line placement   GI bleed   Acute respiratory failure (HCC)   Anoxic encephalopathy ([place])   Cerebral embolism with cerebral infarction   Pulmonary emboli ([place])   Palliative care by specialist   Goals of care, counseling/discussion   DNR (do not resuscitate)   Status post tracheostomy ([place])   Protein-calorie malnutrition, severe   Neuroendocrine cancer ([place])     Code Status: DNR Family Communication: [name] niece updated.  She is aware that primary reason for remaining in [place] is lack of bed availability.  She has been updated on patient's poor intake in relation to location of gastric mass.  She is aware he is not having any abdominal pain and is overall comfortable.   Consultants:  Palliative medicine team  Oncology  PCCM  Procedures:  Echocardiogram  EEG  EGD  Cortrack feeding tube  Antibiotics: Anti-infectives (From admission, onward)   Start     Dose/Rate Route Frequency Ordered Stop   06/02/20 1630  Ampicillin-Sulbactam (UNASYN) 3 g in sodium chloride 0.9 % 100 mL IVPB        3 g 200 mL/hr over 30 Minutes Intravenous Every 6 hours 06/02/20 1626 06/10/20 2215   06/02/20 0645  cefTRIAXone (ROCEPHIN) 2 g in sodium chloride 0.9 % 100 mL IVPB  Status:  Discontinued        2 g 200 mL/hr over 30 Minutes Intravenous Daily  06/02/20 0622 06/02/20 1607   06/01/20 0715  cefTRIAXone (ROCEPHIN) 1 g in sodium chloride 0.9 % 100 mL IVPB  Status:  Discontinued        1 g 200 mL/hr over 30 Minutes Intravenous Every 24 hours 06/01/20 0710 06/02/20 0622   03/26/20 1100  doxycycline (VIBRAMYCIN) 100 mg in sodium chloride 0.9 % 250 mL IVPB  Status:  Discontinued        100 mg 125 mL/hr over 120 Minutes Intravenous 2 times daily 03/26/20 1002 04/01/20 1035   03/25/20 1100  doxycycline (VIBRA-TABS) tablet 100 mg  Status:  Discontinued        100 mg Per Tube Every 12 hours 03/25/20 1009 03/26/20 1002   03/25/20 1015  doxycycline (VIBRAMYCIN) 100 mg in sodium chloride 0.9 % 250 mL IVPB  Status:  Discontinued       Note to Pharmacy: To be given after the tracheal aspirate is collected.   100 mg  125 mL/hr over 120 Minutes Intravenous Every 12 hours 03/25/20 1005 03/25/20 1008       Time spent: 22 minutes    Erin Hearing ANP [facility] Pager [phone]. If 7PM-7AM, please contact night-coverage at [email or web address] 06/16/2020, 11:24 AM  LOS: 101 days

## 2020-06-16 NOTE — Plan of Care (Signed)
  Problem: Health Behavior/Discharge Planning: Goal: Ability to manage health-related needs will improve Outcome: Progressing   Problem: Nutrition: Goal: Adequate nutrition will be maintained Outcome: Progressing   Problem: Coping: Goal: Level of anxiety will decrease Outcome: Progressing   Problem: Safety: Goal: Ability to remain free from injury will improve Outcome: Progressing   Problem: Coping: Goal: Will verbalize positive feelings about self Outcome: Progressing Goal: Will identify appropriate support needs Outcome: Progressing   Problem: Self-Care: Goal: Ability to participate in self-care as condition permits will improve Outcome: Progressing Goal: Verbalization of feelings and concerns over difficulty with self-care will improve Outcome: Progressing Goal: Ability to communicate needs accurately will improve Outcome: Progressing   Problem: Spontaneous Subarachnoid Hemorrhage Tissue Perfusion: Goal: Complications of Spontaneous Subarachnoid Hemorrhage will be minimized Outcome: Progressing

## 2020-06-16 NOTE — TOC Progression Note (Addendum)
Transition of Care [facility]) - Progression Note lacememnt   Patient Details  Name: Saahas Hidrogo MRN: [id number] Date of Birth: May 08, 1956  Transition of Care [facility]) CM/SW [place], RN Phone Number: [phone]  06/16/2020, 10:16 AM  Clinical Narrative: CM printed list for facilities near [facility]. Messages left for [place], [place], [place] and rehab, [place] and rehab center, [place] and healthcare, [facility], [facility], [place], [place]  faxed to [place] [facility] [place], [place] and [place], [place] at [employer]. CM will continue to seek bed offer.  Expected Discharge Plan: Skilled Nursing Facility Barriers to Discharge: No SNF bed  Expected Discharge Plan and Services Expected Discharge Plan: [place] In-house Referral: Clinical Social Work     Living arrangements for the past 2 months: Apartment                                       Social Determinants of Health (SDOH) Interventions    Readmission Risk Interventions No flowsheet data found.

## 2020-06-17 NOTE — Progress Notes (Signed)
PROGRESS NOTE    Kenneth Sullivan  ZOX:[id number] DOB: [birth year] DOA: 03/07/2020 PCP: Medicine, [name]    Chief Complaint  Patient presents with  . Cardiac Arrest    Brief Narrative:   65 year old gentleman with no prior medical history initially presented to ED with shortness of breath and acute respiratory failure, had a witnessed bradycardic asystolic arrest.  He underwent normothermia protocol and was found to have massive PE and DVT with right ventricular strain.  Hospital course was initially complicated by upper GI bleed due to gastric ulcer.  CT abdomen pelvis showed a large antral ulcer, mass with multiple liver lesions and biopsy confirmed high-grade neuroendocrine tumor.  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  Patient underwent IR guided mechanical thrombectomy for his massive PE and IVC filter.  Postprocedure while on heparin drip patient started having maroon-colored red stool and bloody drainage  from the OGT.  GI consulted and he underwent endoscopy showing gastric ulcer with blood clots.  IR consulted and he underwent GDA embolization. His hospital course was further complicated by multiple acute to subacute ischemic infarcts involving bilateral cerebral hemispheres consistent with global hypoperfusion and also associated with scattered petechial hemorrhages with evidence of hemorrhagic conversion in the parietal lobe. Oncology consulted and initially discussed with patient's adenocarcinoma diagnosis with the patient's niece over the phone , suggested that patient is not a candidate for chemotherapy, recommended residential hospice vs home hospice.  Palliative care consulted and discussions are ongoing regarding hospice placement versus SNF.  Assessment & Plan:   Principal Problem:   Cardiac arrest [facility]) Active Problems:   Encounter for central line placement   GI bleed   Acute respiratory failure (HCC)   Anoxic encephalopathy (HCC)    Cerebral embolism with cerebral infarction   Pulmonary emboli ([place])   Palliative care by specialist   Goals of care, counseling/discussion   DNR (do not resuscitate)   Status post tracheostomy ([place])   Protein-calorie malnutrition, severe   Neuroendocrine cancer ([place])   Acute hypoxic respiratory failure in the setting of massive PE/cardiac arrest Status post mechanical ventilation and tracheostomy.  He was subsequently decannulated and Patient is on room air with good sats.    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 Currently not on any anticoagulation due to GI bleed and CVA with hemorrhagic conversion. 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 Continue with Seroquel and Prozac. Patient continues to be confused, refusing solid foods.  Taking only liquid diet at this time as per as needed   Upper GI bleed due to large malignant gastric ulcer with acute blood loss anemia EGD, revealing adenocarcinoma Embolization of the gastroduodenal artery by IR Continue with PPI.     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 Oncology consulted and recommended that patient is not a candidate for chemotherapy in this current situation due to multiple medical comorbidities and has signed off. Palliative care consulted and discussions are ongoing.    Questionable sepsis sepsis secondary to Streptococcus bacteremia Completed 10 days of Unasyn.  Patient remains afebrile but has mild tachycardia and leukocytosis probably secondary to malignancy. Afebrile the last 24 hours.    Constipation As needed Senokot.   Hypokalemia Replaced  Debility/physical deconditioning PT/OT evaluation recommending SNF/24-hour supervision    Severe malnutrition probably secondary to adenocarcinoma, chronic illness multiple liver lesions,  metastatic disease And is able  to take only liquid diet at this time.  He is refusing to take solid food.    DVT prophylaxis: SCDs Code Status: DNR Family Communication: Niece at bedside Disposition:   Status is: Inpatient  Remains inpatient appropriate because:Unsafe d/c plan   Dispo: The patient is from: Home              Anticipated d/c is to: Pending              Anticipated d/c date is: > 3 days              Patient currently is medically stable to d/c.       Consultants:   Palliative  Oncology  Neurology  PCCM  Procedures: 3/23>> Admit, EGD with findings of oozing gastric ulcer with clot, IR for mechanical thrombectomy, IVC filter and GDA embolization 4/5>>Trach 4/28>>trach decannulated.   Significant studies: CTA 3/23 >> bilateral PE with RV/with ratio 1.9, emphysema CT head 3/23 >>chronic microvascular changes. No acute findings Echo 3/23 >> RV is severely dilated with reduced function and D-shaped septum, LVEF 60-65%. Venogram 3/23>> occlusive DVT of the iliac vein extending to the lower IVC LE Venous Duplex 3/24 >>acute DVT in BLE up to the femoral vein  MRI Brain 3/28 >> multifocal acute to subacute ischemic infarcts involving bilateral cerebral hemispheres, findings consistent with global hypoperfusion event related to cardiac arrest, associated scattered petechial hemorrhage about many areas of ischemia, evidence of hemorrhagic conversion in the right parietal lobe  CT abdomen and pelvis on 6/19 with increased size of hepatic metastasis with new small liver lesions    Antimicrobials: None   Subjective: Patient sleeping at this time, did not want to eat his breakfast  Objective: Vitals:   06/16/20 0735 06/16/20 1541 06/16/20 2137 06/17/20 0819  BP: (!) 117/94 128/83 128/84 115/82  Pulse: 97 (!) 101 (!) 107 (!) 101  Resp: 16 16 18 18   Temp: 97.9 F (36.6 C) 99.7 F (37.6 C) 98 F (36.7 C) 98.7 F (37.1 C)  TempSrc: Oral Oral Oral  Oral  SpO2: 97% 96% 95% 94%  Weight:      Height:        Intake/Output Summary (Last 24 hours) at 06/17/2020 1605 Last data filed at 06/17/2020 1200 Gross per 24 hour  Intake 240 ml  Output 200 ml  Net 40 ml   Filed Weights   05/31/20 0500 06/01/20 0254 06/11/20 0513  Weight: 63.5 kg 66.7 kg 80.7 kg    Examination:  General exam: Comfortable not in any kind of distress Respiratory system: Clear to auscultation bilaterally, no wheezing or rhonchi Cardiovascular system: S1-S2 heard, regular rate rhythm, no JVD, no pedal edema Gastrointestinal system: Abdomen is soft, nontender, nondistended, bowel sounds normal. Central nervous system: Woke up to verbal cues, oriented to self only Extremities: No pedal edema Skin: No rashes seen Psychiatry: Cannot be assessed    Data Reviewed: I have personally reviewed following labs and imaging studies  CBC: No results for input(s): WBC, NEUTROABS, HGB, HCT, MCV, PLT in the last 168 hours.  Basic Metabolic Panel: Recent Labs  Lab 06/12/20 0351  NA 144  K 3.3*  CL 110  CO2 23  GLUCOSE 98  BUN 6*  CREATININE 0.76  CALCIUM 8.3*  MG 1.9    GFR: Estimated Creatinine Clearance: 109.3 mL/min (by C-G formula based on SCr of 0.76 mg/dL).  Liver Function Tests: Recent Labs  Lab 06/12/20 0351  AST 31  ALT 19  ALKPHOS  93  BILITOT 0.4  PROT 5.9*  ALBUMIN 1.8*    CBG: No results for input(s): GLUCAP in the last 168 hours.   No results found for this or any previous visit (from the past 240 hour(s)).       Radiology Studies: No results found.      Scheduled Meds: . clonazePAM  0.5 mg Oral BID  . feeding supplement (ENSURE ENLIVE)  237 mL Oral QID  . ferrous sulfate  325 mg Oral Q breakfast  . FLUoxetine  10 mg Oral QHS  . OXcarbazepine  75 mg Oral BID  . pantoprazole  40 mg Oral Daily  . polyethylene glycol  17 g Oral BID  . potassium chloride  20 mEq Oral Daily  . QUEtiapine  100 mg Oral QHS  . QUEtiapine  75  mg Oral Daily  . senna-docusate  1 tablet Oral BID   Continuous Infusions: . sodium chloride    . sodium chloride 50 mL/hr at 06/17/20 1012     LOS: 102 days       Hosie Poisson, MD [facility]   To contact the attending provider between 7A-7P or the covering provider during after hours 7P-7A, please log into the web site [email or web address] and access using universal [place] password for that web site. If you do not have the password, please call the hospital operator.  06/17/2020, 4:05 PM

## 2020-06-17 NOTE — Progress Notes (Signed)
Palliative Medicine Inpatient Follow Up Note   Reason for consult:  Goal of Care "Goal of care. Patient with metastatic adenocarcinoma and complex medical and social barriers for chemotherapy"  HPI:  Per Hospitalist Note --> 64 year old with no prior medical history who had acute shortness of breath respiratory failure oxygen saturation in the 40s on EMS arrival. He had a witnessed bradycardic, asystole arrest when EMS was present with 3 rounds of epi, 20 minutes of CPR prior to [place]. Underwent normothermia protocol. Work-up was significant for massive PE, DVT with right ventricular strain. Unfortunately hospital course was complicated by upper GI bleed due to gastric ulcer. A CT abdomen pelvis showed a large antral mass and multiple liver lesions biopsy confirmed a high-grade neuroendocrine tumor, subsequent EGD with biopsy revealing adenocarcinoma.   Palliative care was re-consulted in the setting of declining health state to verify goals moving forward. 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 Oncology has seen and evaluated this patient, he is not a candidate for chemotherapy.  Today's Discussion (06/17/2020): Chart reviewed. Met with Joneen Boers at bedside, he was joined by his niece, [name]. [name] was immensely impassioned on the phone with Dr. Karleen Hampshire.   [name] stated that we are with holding treatment from [place] based upon his race. I asked her to please tell me more about this. She stated that she feels that because Ronnald is of African American decent the oncology team has opted to not offer him chemotherapy. I shared with [name] that he had discussed this last week. Chemo is not being offered to [place] because he is not an appropriate candidate given his frailty and the reality of chemo causing increased health burden for patients such as him.   [name] goes on to tell me and [place]  that this is based upon his race. I kindly stated that this in no way has anything to do with why he is not receiving chemotherapy. I shared with her that if it were medically appropriate it would be offered.  We then talked about [place] and how he means the world to [place] and she would never want to hurst him. She asked me why I was there and if I thought she was a threat to myself or the staff. I told [name] that I am working on the Palliative care team and it's important that I know and understand her concerns as they relate to her uncle as there may be information I can provide to better help her understand the present situation.  [name] shares that she is truly overburdened by everything going on in her own life. She expresses guilt for not being able to take [place] home. She said that she herself has suffered two heart attacks and birthed seven children. She said that she is trying to pay off her mortgage and cannot take on anything more. I offered therapeutic listening as a form of emotional support. [name] was extremely tangential throughout our conversation and intermittently more difficult to follow. She did defervesce throughout our time together.   I shared with [name] that the Palliative team will remain available if needed. The greatest issue at hand right now is placement. [name] would prefer if Deray went somewhere closer to her home in the [place] area. It appears that the [facility] team has been working on this.   Discussed the importance of continued conversation with family and their  medical providers regarding overall plan of care and  treatment options, ensuring decisions are within the context of the patients values and GOCs.  Questions and concerns addressed   SUMMARY OF RECOMMENDATIONS DNAR/DNI  CM to help with placement, per review of chart it appears that the search has been expanded to the [place] area  PMT signed off yesterday though we are available if needed  Time  Spent: 60 Greater than 50% of the time was spent in counseling and coordination of care ______________________________________________________________________________________ [place] Team Team Cell Phone: [phone] Please utilize secure chat with additional questions, if there is no response within 30 minutes please call the above phone number  Palliative Medicine Team providers are available by phone from 7am to 7pm daily and can be reached through the team cell phone.  Should this patient require assistance outside of these hours, please call the patient's attending physician.

## 2020-06-18 NOTE — Progress Notes (Signed)
PROGRESS NOTE    Kenneth Sullivan  XNT:[id number] DOB: 08-19-1956 DOA: 03/07/2020 PCP: Medicine, [name]    Chief Complaint  Patient presents with  . Cardiac Arrest    Brief Narrative:   64 year old gentleman with no prior medical history initially presented to ED with shortness of breath and acute respiratory failure, had a witnessed bradycardic asystolic arrest.  He underwent normothermia protocol and was found to have massive PE and DVT with right ventricular strain.  Hospital course was initially complicated by upper GI bleed due to gastric ulcer.  CT abdomen pelvis showed a large antral ulcer, mass with multiple liver lesions and biopsy confirmed high-grade neuroendocrine tumor.  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  Patient underwent IR guided mechanical thrombectomy for his massive PE and IVC filter.  Postprocedure while on heparin drip patient started having maroon-colored red stool and bloody drainage  from the OGT.  GI consulted and he underwent endoscopy showing gastric ulcer with blood clots.  IR consulted and he underwent GDA embolization. His hospital course was further complicated by multiple acute to subacute ischemic infarcts involving bilateral cerebral hemispheres consistent with global hypoperfusion and also associated with scattered petechial hemorrhages with evidence of hemorrhagic conversion in the parietal lobe. Oncology consulted and initially discussed with patient's adenocarcinoma diagnosis with the patient's niece over the phone , suggested that patient is not a candidate for chemotherapy, recommended residential hospice vs home hospice.  Palliative care consulted and discussions are ongoing regarding hospice placement versus SNF. Pt seen and examined at bedside. No change in his status.   Assessment & Plan:   Principal Problem:   Cardiac arrest [facility]) Active Problems:   Encounter for central line placement   GI bleed   Acute  respiratory failure (HCC)   Anoxic encephalopathy (HCC)   Cerebral embolism with cerebral infarction   Pulmonary emboli ([place])   Palliative care by specialist   Goals of care, counseling/discussion   DNR (do not resuscitate)   Status post tracheostomy ([place])   Protein-calorie malnutrition, severe   Neuroendocrine cancer ([place])   Acute hypoxic respiratory failure in the setting of massive PE/cardiac arrest Status post mechanical ventilation and tracheostomy.  He was subsequently decannulated and Currently remains on room air.    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 Patient is not on any anticoagulation due to GI bleed and CVA with hemorrhagic conversion.   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 With hallucinations and delusions. Continue with Seroquel and Prozac. Patient continues to be confused.   Upper GI bleed due to large malignant gastric ulcer with acute blood loss anemia EGD, revealing adenocarcinoma Embolization of the gastroduodenal artery by IR Continue with PPI     Gastric adenocarcinoma with neuroendocrine component with mets to the liver. Oncology consulted and recommended that patient is not a candidate for chemotherapy in this current situation due to multiple medical comorbidities and has signed off. Palliative care consulted and discussions are ongoing.    Questionable sepsis sepsis secondary to Streptococcus bacteremia Completed 10 days of Unasyn.  Patient remains afebrile but has mild tachycardia and leukocytosis probably secondary to malignancy. Afebrile, persistent leukocytosis    Constipation As needed Senokot.   Hypokalemia Replaced  Debility/physical deconditioning PT/OT evaluation recommending SNF/24-hour supervision    Severe malnutrition probably secondary to adenocarcinoma, chronic illness multiple liver lesions, metastatic disease Patient  mostly takes Ensure supplementation and liquid diet.  Leukocytosis Probably  reactive.  DVT prophylaxis: SCDs Code Status: DNR Family Communication: None at bedside Disposition:   Status is: Inpatient  Remains inpatient appropriate because:Unsafe d/c plan   Dispo: The patient is from: Home              Anticipated d/c is to: Pending              Anticipated d/c date is: > 3 days              Patient currently is medically stable to d/c.       Consultants:   Palliative  Oncology  Neurology  PCCM  Procedures: 3/23>> Admit, EGD with findings of oozing gastric ulcer with clot, IR for mechanical thrombectomy, IVC filter and GDA embolization 4/5>>Trach 4/28>>trach decannulated.   Significant studies: CTA 3/23 >> bilateral PE with RV/with ratio 1.9, emphysema CT head 3/23 >>chronic microvascular changes. No acute findings Echo 3/23 >> RV is severely dilated with reduced function and D-shaped septum, LVEF 60-65%. Venogram 3/23>> occlusive DVT of the iliac vein extending to the lower IVC LE Venous Duplex 3/24 >>acute DVT in BLE up to the femoral vein  MRI Brain 3/28 >> multifocal acute to subacute ischemic infarcts involving bilateral cerebral hemispheres, findings consistent with global hypoperfusion event related to cardiac arrest, associated scattered petechial hemorrhage about many areas of ischemia, evidence of hemorrhagic conversion in the right parietal lobe  CT abdomen and pelvis on 6/19 with increased size of hepatic metastasis with new small liver lesions    Antimicrobials: None   Subjective: Requesting to order breakfast.  No chest pain or shortness of breath  Objective: Vitals:   06/17/20 0819 06/17/20 1742 06/17/20 2006 06/18/20 0834  BP: 115/82 125/76 122/75 121/82  Pulse: (!) 101 (!) 110 (!) 101 99  Resp: 18 19 18 19   Temp: 98.7 F (37.1 C) 97.8 F (36.6 C) 98.2 F (36.8 C) 98.9 F (37.2 C)  TempSrc: Oral   Oral  SpO2: 94% 97% 95% 99%    Weight:      Height:        Intake/Output Summary (Last 24 hours) at 06/18/2020 0957 Last data filed at 06/17/2020 1800 Gross per 24 hour  Intake 240 ml  Output 300 ml  Net -60 ml   Filed Weights   05/31/20 0500 06/01/20 0254 06/11/20 0513  Weight: 63.5 kg 66.7 kg 80.7 kg    Examination:  General exam: Alert, comfortable not in distress Respiratory system: Clear to auscultation bilaterally, no wheezing or rhonchi Cardiovascular system: S1-S2 heard, tachycardic, no JVD Gastrointestinal system: Abdomen is soft, nontender bowel sounds normal Central nervous system: Alert, responding to simple questions, able to move all extremities Extremities: No pedal edema Skin: No rashes Psychiatry: Cannot be assessed    Data Reviewed: I have personally reviewed following labs and imaging studies  CBC: No results for input(s): WBC, NEUTROABS, HGB, HCT, MCV, PLT in the last 168 hours.  Basic Metabolic Panel: Recent Labs  Lab 06/12/20 0351  NA 144  K 3.3*  CL 110  CO2 23  GLUCOSE 98  BUN 6*  CREATININE 0.76  CALCIUM 8.3*  MG 1.9    GFR: Estimated Creatinine Clearance: 109.3 mL/min (by C-G formula based on SCr of 0.76 mg/dL).  Liver Function Tests: Recent Labs  Lab 06/12/20 0351  AST 31  ALT 19  ALKPHOS 93  BILITOT 0.4  PROT 5.9*  ALBUMIN 1.8*    CBG: No results for input(s): GLUCAP in the last 168 hours.  No results found for this or any previous visit (from the past 240 hour(s)).       Radiology Studies: No results found.      Scheduled Meds: . clonazePAM  0.5 mg Oral BID  . feeding supplement (ENSURE ENLIVE)  237 mL Oral QID  . ferrous sulfate  325 mg Oral Q breakfast  . FLUoxetine  10 mg Oral QHS  . OXcarbazepine  75 mg Oral BID  . pantoprazole  40 mg Oral Daily  . polyethylene glycol  17 g Oral BID  . potassium chloride  20 mEq Oral Daily  . QUEtiapine  100 mg Oral QHS  . QUEtiapine  75 mg Oral Daily  . senna-docusate  1 tablet Oral BID    Continuous Infusions: . sodium chloride    . sodium chloride 50 mL/hr at 06/18/20 0725     LOS: 103 days       Hosie Poisson, MD [facility]   To contact the attending provider between 7A-7P or the covering provider during after hours 7P-7A, please log into the web site [email or web address] and access using universal [place] password for that web site. If you do not have the password, please call the hospital operator.  06/18/2020, 9:57 AM

## 2020-06-18 NOTE — Progress Notes (Signed)
Patient refused his night time medications. Verbally abusive towards staff and agitated for no reason. No acute distress noted. Will try again later.

## 2020-06-19 LAB — COMPREHENSIVE METABOLIC PANEL
ALT: 10 U/L (ref 0–44)
AST: 14 U/L — ABNORMAL LOW (ref 15–41)
Albumin: 1.8 g/dL — ABNORMAL LOW (ref 3.5–5.0)
Alkaline Phosphatase: 70 U/L (ref 38–126)
Anion gap: 10 (ref 5–15)
BUN: <5 mg/dL — ABNORMAL LOW (ref 8–23)
CO2: 23 mmol/L (ref 22–32)
Calcium: 8.1 mg/dL — ABNORMAL LOW (ref 8.9–10.3)
Chloride: 104 mmol/L (ref 98–111)
Creatinine, Ser: 0.66 mg/dL (ref 0.61–1.24)
GFR calc Af Amer: >60 mL/min (ref >60)
GFR calc non Af Amer: >60 mL/min (ref >60)
Glucose, Bld: 76 mg/dL (ref 70–99)
Potassium: 3.5 mmol/L (ref 3.5–5.1)
Sodium: 137 mmol/L (ref 135–145)
Total Bilirubin: 1.1 mg/dL (ref 0.3–1.2)
Total Protein: 5.4 g/dL — ABNORMAL LOW (ref 6.5–8.1)

## 2020-06-19 LAB — CBC
HCT: 23.2 % — ABNORMAL LOW (ref 39.0–52.0)
Hemoglobin: 7.5 g/dL — ABNORMAL LOW (ref 13.0–17.0)
MCH: 26.6 pg (ref 26.0–34.0)
MCHC: 32.3 g/dL (ref 30.0–36.0)
MCV: 82.3 fL (ref 80.0–100.0)
Platelets: 724 10*3/uL — ABNORMAL HIGH (ref 150–400)
RBC: 2.82 MIL/uL — ABNORMAL LOW (ref 4.22–5.81)
RDW: 17.4 % — ABNORMAL HIGH (ref 11.5–15.5)
WBC: 13.3 10*3/uL — ABNORMAL HIGH (ref 4.0–10.5)
nRBC: 0 % (ref 0.0–0.2)

## 2020-06-19 LAB — MAGNESIUM: Magnesium: 1.7 mg/dL (ref 1.7–2.4)

## 2020-06-19 MED ORDER — QUETIAPINE FUMARATE 50 MG PO TABS
150.0000 mg | ORAL_TABLET | Freq: Every day | ORAL | Status: DC
  Administered 2020-06-20 – 2020-06-30 (×8): 150 mg via ORAL
  Filled 2020-06-19 (×12): qty 1

## 2020-06-19 MED ORDER — QUETIAPINE FUMARATE 100 MG PO TABS
100.0000 mg | ORAL_TABLET | Freq: Every day | ORAL | Status: DC
  Administered 2020-06-20 – 2020-07-03 (×14): 100 mg via ORAL
  Filled 2020-06-19 (×14): qty 1

## 2020-06-19 NOTE — Progress Notes (Addendum)
[facility] PROGRESS NOTE  Yashua Bracco UUV:[id number] DOB: 11-Nov-1956 DOA: 03/07/2020 PCP: [facility], [name]  Status: Inpatient--Remains inpatient appropriate because:Altered mental status, Unsafe d/c plan and Inpatient level of care appropriate due to severity of illness   Dispo: The patient is from: Home              Anticipated d/c is to: Hospice for palliative medicine versus SNF              Anticipated d/c date is: > 3 days              Patient currently is medically stable to d/c.  **As of 7/2 there are no beds available at [facility] area SNF facilities **Palliative care documents that patient is appropriate for home hospice but niece who would be primary caretaker is unable to take the patient home as she is working multiple jobs.  She continues to agree with hospice care but placement and location of care remain difficult since patient has Medicaid.  HPI: 64 year old gentleman with no prior medical history initially presented to ED with shortness of breath and acute respiratory failure, had a witnessed bradycardic asystolic arrest.  He underwent normothermia protocol and was found to have massive PE and DVT with right ventricular strain.  Hospital course was initially complicated by upper GI bleed due to gastric ulcer.  CT abdomen pelvis showed a large antral ulcer, mass with multiple liver lesions and biopsy confirmed high-grade neuroendocrine tumor.  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  Patient underwent IR guided mechanical thrombectomy for his massive PE and IVC filter.  Postprocedure while on heparin drip patient started having maroon-colored red stool and bloody drainage  from the OGT.  GI consulted and he underwent endoscopy showing gastric ulcer with blood clots.  IR consulted and he underwent GDA embolization.  Hospital course was further complicated by multiple acute to subacute ischemic infarcts involving bilateral cerebral  hemispheres consistent with global hypoperfusion and also associated with scattered petechial hemorrhages with evidence of hemorrhagic conversion in the parietal lobe.  Oncology consulted and initially discussed with patient's adenocarcinoma diagnosis with the patient's niece over the phone recommended chemotherapy versus hospice.Palliative care consulted and niece agrees with hospice care.  Unfortunately she is now working multiple jobs unable to accommodate taking patient home for home hospice.  Placement has been difficult given patient having Medicare and lack of available beds.  Subjective: Awakened.  Was not aggressive toward me but was asking me to leave him alone.  Explained I would listen to his lungs and cover him back up and let him go back to sleep therefore he agreed to let me examine him.  Objective: Vitals:   06/18/20 2055 06/19/20 0737  BP: 125/79 133/83  Pulse: (!) 102 (!) 102  Resp: 14 19  Temp: 98.3 F (36.8 C) 97.6 F (36.4 C)  SpO2: 94% 100%   No intake or output data in the 24 hours ending 06/19/20 1003 Filed Weights   05/31/20 0500 06/01/20 0254 06/11/20 0513  Weight: 63.5 kg 66.7 kg 80.7 kg    Exam: Constitutional: NAD, calm, appears comfortable ENMT: Mucous membranes are moist.Normal dentition.  Respiratory: clear to auscultation bilaterally, Normal respiratory effort.  Room air Cardiovascular: Regular rate and rhythm, no murmurs / rubs / gallops. No extremity edema. 2+ pedal pulses.   Abdomen: no tenderness, no masses palpated. No hepatosplenomegaly. Bowel sounds positive.  Poor oral intake documented Musculoskeletal: no clubbing / cyanosis. No joint deformity upper and  lower extremities. Good ROM, no contractures.  Skin: no rashes, lesions, ulcers. No induration Neurologic: CN 2-12 grossly intact. Sensation intact, DTR normal. Strength 4/5 x all 4 extremities.  Psychiatric: Alert, oriented to name only   Assessment/Plan: Acute hypoxic respiratory  failure in the setting of massive PE/cardiac arrest Status post mechanical ventilation and tracheostomy.   Now decannulated and stable on room air.  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 Currently not on any anticoagulation due to GI bleed and CVA with hemorrhagic conversion.  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 Having more issues with agitation and refusing medications and care currently on Seroquel 75 in the morning and 100 at bedtime.  Will increase a.m. dose to 100 and given symptoms worse at night will increase nocturnal dose to 150 mg, if continues to refuse oral medications may need to hold Seroquel in favor of Haldol. 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 Due to location of gastric mass it is not unexpected that patient unable to eat-recommendation is for easy to digest foods with focus on high-protein beverages and shakes Patient with poor oral intake and glucose running as low as 76-continue to follow-like to avoid IV dextrose maintenance fluids given this is not a long-term option for this patient  Sepsis 2/2 Streptococcus bacteremia Sepsis physiology has resolved Completed 10 days of Unasyn.   Patient remains afebrile but has mild tachycardia and leukocytosis probably secondary to  malignancy and ongoing malnutrition  Constipation As needed Senokot.  Acute hypokalemia Continues to have lower than expected potassium readings ranging from 3.1-3.8 Begin potassium 20 mEq p.o. daily and follow lab Magnesium 1.9  Debility/physical deconditioning PT: Continues to require moderate assistance from PT for mobility, tolerating short distance ambulation.  Patient tolerated HHA versus rolling walker as rolling walker tends to confuse patient.  Patient unable to assist with cleansing after urinary incontinence.  PT recommending SNF/24-hour supervision OT: Moderate assist for drinking from cup apparently has improved from total assist.  Minimal assist to wash face in place Chapstick on lips with cues throughout.  Noted with difficulty redirecting with 24-hour supervision/SNF recommended  Severe malnutrition probably secondary to adenocarcinoma, chronic illness multiple liver lesions, metastatic disease Nutrition Status: Nutrition Problem: Severe Malnutrition Etiology: chronic illness (gastric lesion, multiple liver lesions, concern for metastatic disease) Signs/Symptoms: severe fat depletion, severe muscle depletion Interventions: Ensure Enlive (each supplement provides 350kcal and 20 grams of protein), Magic cup .Gastric mass extends from antrum into prepyloric area and into pylorus and probably in the Duodenal Bulb. I was not able to see the duodenal bulb clearly.  Unlikely patient will be able to eat appropriately again given degree of cancer invasion and likely would benefit more from comfort feeds-will be on easy to digest food and high-protein beverages and shakes See above regarding borderline hypoglycemia and refusal to take oral medication-I/O data incomplete as of July 4-unclear if patient taking any oral  intake at all    Data Reviewed: Basic Metabolic Panel: Recent Labs  Lab 06/19/20 0030  NA 137  K 3.5  CL 104  CO2 23  GLUCOSE 76  BUN <5*  CREATININE 0.66   CALCIUM 8.1*  MG 1.7   Liver Function Tests: Recent Labs  Lab 06/19/20 0030  AST 14*  ALT 10  ALKPHOS 70  BILITOT 1.1  PROT 5.4*  ALBUMIN 1.8*   No results for input(s): LIPASE, AMYLASE in the last 168 hours. No results for input(s): AMMONIA in the last 168 hours. CBC: Recent Labs  Lab 06/19/20 0030  WBC 13.3*  HGB 7.5*  HCT 23.2*  MCV 82.3  PLT 724*   Cardiac Enzymes: No results for input(s): CKTOTAL, CKMB, CKMBINDEX, TROPONINI in the last 168 hours. BNP (last 3 results) Recent Labs    03/07/20 0710  BNP 74.6    ProBNP (last 3 results) No results for input(s): PROBNP in the last 8760 hours.  CBG: No results for input(s): GLUCAP in the last 168 hours.  No results found for this or any previous visit (from the past 240 hour(s)).   Studies: No results found.  Scheduled Meds: . clonazePAM  0.5 mg Oral BID  . feeding supplement (ENSURE ENLIVE)  237 mL Oral QID  . ferrous sulfate  325 mg Oral Q breakfast  . FLUoxetine  10 mg Oral QHS  . OXcarbazepine  75 mg Oral BID  . pantoprazole  40 mg Oral Daily  . polyethylene glycol  17 g Oral BID  . potassium chloride  20 mEq Oral Daily  . QUEtiapine  100 mg Oral QHS  . QUEtiapine  75 mg Oral Daily  . senna-docusate  1 tablet Oral BID   Continuous Infusions: . sodium chloride    . sodium chloride 50 mL/hr at 06/19/20 0045    Principal Problem:   Cardiac arrest [facility]) Active Problems:   Encounter for central line placement   GI bleed   Acute respiratory failure (HCC)   Anoxic encephalopathy ([place])   Cerebral embolism with cerebral infarction   Pulmonary emboli ([place])   Palliative care by specialist   Goals of care, counseling/discussion   DNR (do not resuscitate)   Status post tracheostomy ([place])   Protein-calorie malnutrition, severe   Neuroendocrine cancer ([place])     Code Status: DNR Family Communication: [name] niece updated.  She is aware that primary reason for remaining in [place] is  lack of bed availability.  She has been updated on patient's poor intake in relation to location of gastric mass.  She is aware he is not having any abdominal pain and is overall comfortable.   Consultants:  Palliative medicine team  Oncology  PCCM  Procedures:  Echocardiogram  EEG  EGD  Cortrack feeding tube  Antibiotics: Anti-infectives (From admission, onward)   Start     Dose/Rate Route Frequency Ordered Stop   06/02/20 1630  Ampicillin-Sulbactam (UNASYN) 3 g in sodium chloride 0.9 % 100 mL IVPB        3 g 200 mL/hr over 30 Minutes Intravenous Every 6 hours 06/02/20 1626 06/10/20 2215   06/02/20 0645  cefTRIAXone (ROCEPHIN) 2 g in sodium chloride 0.9 % 100 mL IVPB  Status:  Discontinued        2 g 200 mL/hr over 30 Minutes Intravenous Daily 06/02/20 0622 06/02/20 1607   06/01/20 0715  cefTRIAXone (ROCEPHIN) 1 g in sodium chloride 0.9 % 100 mL IVPB  Status:  Discontinued  1 g 200 mL/hr over 30 Minutes Intravenous Every 24 hours 06/01/20 0710 06/02/20 0622   03/26/20 1100  doxycycline (VIBRAMYCIN) 100 mg in sodium chloride 0.9 % 250 mL IVPB  Status:  Discontinued        100 mg 125 mL/hr over 120 Minutes Intravenous 2 times daily 03/26/20 1002 04/01/20 1035   03/25/20 1100  doxycycline (VIBRA-TABS) tablet 100 mg  Status:  Discontinued        100 mg Per Tube Every 12 hours 03/25/20 1009 03/26/20 1002   03/25/20 1015  doxycycline (VIBRAMYCIN) 100 mg in sodium chloride 0.9 % 250 mL IVPB  Status:  Discontinued       Note to Pharmacy: To be given after the tracheal aspirate is collected.   100 mg 125 mL/hr over 120 Minutes Intravenous Every 12 hours 03/25/20 1005 03/25/20 1008       Time spent: 20 minutes    Erin Hearing ANP [facility] Pager [phone]. If 7PM-7AM, please contact night-coverage at [email or web address] 06/19/2020, 10:03 AM  LOS: 104 days

## 2020-06-19 NOTE — Progress Notes (Signed)
Physical Therapy Treatment Patient Details Name: Kenneth Sullivan MRN: [id number] DOB: 09/02/1956 Today's Date: 06/19/2020    History of Present Illness 64 y.o. male admitted 03/07/20 with acute SOB; had bradycardic event with asystole/cardiac arrest, ROSC achieved in 20 minutes with 3 rounds epinephrine; intubated 3/23. Pt with massive PE/DVT and RV strain. S/p IR guided mechanical thrombectomy and IVC filter placement. Devoloped gastric ulcer s/p GDA embolization. MRI 3/27 revealed multifocal acute to subacute ischemic infarct involving bilateral cerebral hemisphere consistent with global hypoperfusion; associated scattered petechial hemorrhages with evidence of hemmorhagic conversion in R parietal lobe. S/p trach 4/5, changed to cuffless 4/22; decannulated 4/28. Liver biopsy 5/19. 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 On 06/01/20 - pt with UTI.  On 06/08/20 - pt continues to have a fever, mention of possible hospice in notes if does not improve. PMH unknown.    PT Comments    Continuing work on functional mobility and activity tolerance;  Upon arrival, pt with urine and bowel incontinence in bed. Pt continues to present with poor cognition, vision, balance, functional use of BUE, balance, strength, and activity tolerance. Pt requiring Max cues for participating in ADLs and OOB activity.  Requiring Mod A +2 for functional mobility; poor safety awareness and pt sitting prematurely requiring Mod A to guiding hips to seat. Continue to recommend dc to SNF and will continue to follow acutely as admitted;   Noting variable functional status and progress throughout this admission; In the most recent PT sessions, Kenneth Sullivan has shown decr functional mobility, and at this point, will adjust PT goals   Follow Up Recommendations  Supervision/Assistance - 24 hour;SNF     Equipment Recommendations  Wheelchair cushion (measurements  PT);Wheelchair (measurements PT);3in1 (PT)    Recommendations for Other Services       Precautions / Restrictions Precautions Precautions: Fall Precaution Comments: impulsive    Mobility  Bed Mobility Overal bed mobility: Needs Assistance Bed Mobility: Supine to Sit     Supine to sit: +2 for physical assistance;+2 for safety/equipment;Mod assist     General bed mobility comments: Mod A +2 to elevate trunk.  Transfers Overall transfer level: Needs assistance Equipment used: 2 person hand held assist Transfers: Sit to/from Stand Sit to Stand: Mod assist;From elevated surface         General transfer comment: Mod A to power up into standing.  Ambulation/Gait Ambulation/Gait assistance: Mod assist;+2 safety/equipment Gait Distance (Feet): 10 Feet (2x5) Assistive device: 2 person hand held assist Gait Pattern/deviations: Step-through pattern;Decreased stride length;Shuffle;Wide base of support;Trunk flexed Gait velocity: decr   General Gait Details: Mod assist for steadying, correcting LOB; Mod/Max multimodal cueing to get to the chair before sitting; Required cues also to get closer to the chair when going back to the recliner; Max assist to move his hips to the chair as he sat down prematurely   Stairs             Wheelchair Mobility    Modified Rankin (Stroke Patients Only) Modified Rankin (Stroke Patients Only) Pre-Morbid Rankin Score: No symptoms Modified Rankin: Moderately severe disability     Balance Overall balance assessment: Needs assistance Sitting-balance support: No upper extremity supported;Feet supported Sitting balance-Leahy Scale: Fair       Standing balance-Leahy Scale: Poor                              Cognition Arousal/Alertness:  Awake/alert Behavior During Therapy: Flat affect;Impulsive Overall Cognitive Status: Impaired/Different from baseline Area of Impairment: Attention;Following  commands;Safety/judgement;Awareness;Problem solving;Memory;Orientation                 Orientation Level: Situation;Disoriented to;Place Current Attention Level: Sustained Memory: Decreased short-term memory;Decreased recall of precautions Following Commands: Follows one step commands inconsistently Safety/Judgement: Decreased awareness of deficits;Decreased awareness of safety Awareness: Intellectual Problem Solving: Requires tactile cues;Requires verbal cues;Slow processing;Decreased initiation;Difficulty sequencing General Comments: Pt focused on returning to bed. Pt with decreased volition to activity and requiring increased cues throughout.       Exercises      General Comments        Pertinent Vitals/Pain Pain Assessment: Faces Faces Pain Scale: Hurts a little bit Pain Location: Stomach Pain Descriptors / Indicators: Discomfort;Guarding Pain Intervention(s): Monitored during session    Home Living                      Prior Function            PT Goals (current goals can now be found in the care plan section) Acute Rehab PT Goals Patient Stated Goal: pt unable PT Goal Formulation: With patient Time For Goal Achievement: 06/16/20 Potential to Achieve Goals: Poor Progress towards PT goals: Progressing toward goals (very slowly, prgress seems to wax and wane)  Will adjust Acute PT goals appropriately    Frequency    Min 2X/week      PT Plan Current plan remains appropriate    Co-evaluation PT/OT/SLP Co-Evaluation/Treatment: Yes Reason for Co-Treatment: For patient/therapist safety;To address functional/ADL transfers PT goals addressed during session: Mobility/safety with mobility        AM-PAC PT "6 Clicks" Mobility   Outcome Measure  Help needed turning from your back to your side while in a flat bed without using bedrails?: A Little Help needed moving from lying on your back to sitting on the side of a flat bed without using  bedrails?: A Lot Help needed moving to and from a bed to a chair (including a wheelchair)?: A Lot Help needed standing up from a chair using your arms (e.g., wheelchair or bedside chair)?: A Lot Help needed to walk in hospital room?: A Lot Help needed climbing 3-5 steps with a railing? : Total 6 Click Score: 12    End of Session Equipment Utilized During Treatment: Gait belt Activity Tolerance: Other (comment);Patient limited by fatigue (confusion) Patient left: in chair;with call bell/phone within reach;with chair alarm set Nurse Communication: Mobility status PT Visit Diagnosis: Unsteadiness on feet (R26.81);Other abnormalities of gait and mobility (R26.89)     Time: 3614-4315 PT Time Calculation (min) (ACUTE ONLY): 25 min  Charges:  $Gait Training: 8-22 mins                     [name], [place]  Acute Rehabilitation Services Pager [phone] Office [phone]    [place] 06/19/2020, 4:50 PM

## 2020-06-19 NOTE — Progress Notes (Signed)
Occupational Therapy Treatment Patient Details Name: Kenneth Sullivan MRN: [id number] DOB: 1956-06-27 Today's Date: 06/19/2020    History of present illness 64 y.o. male admitted 03/07/20 with acute SOB; had bradycardic event with asystole/cardiac arrest, ROSC achieved in 20 minutes with 3 rounds epinephrine; intubated 3/23. Pt with massive PE/DVT and RV strain. S/p IR guided mechanical thrombectomy and IVC filter placement. Devoloped gastric ulcer s/p GDA embolization. MRI 3/27 revealed multifocal acute to subacute ischemic infarct involving bilateral cerebral hemisphere consistent with global hypoperfusion; associated scattered petechial hemorrhages with evidence of hemmorhagic conversion in R parietal lobe. S/p trach 4/5, changed to cuffless 4/22; decannulated 4/28. Liver biopsy 5/19. 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 On 06/01/20 - pt with UTI.  On 06/08/20 - pt continues to have a fever, mention of possible hospice in notes if does not improve. PMH unknown.   OT comments  Upon arrival, pt with urine and bowel incontinence in bed. Pt continues to present with poor cognition, vision, balance, functional use of BUE, balance, strength, and activity tolerance. Pt requiring Max cues for participating in ADLs and OOB activity. Pt requiring Mod A for UB and LB bathing while sitting at sink. Requiring Mod A +2 for functional mobility; poor safety awareness and pt sitting prematurely requiring Mod A to guiding hips to seat. Continue to recommend dc to SNF and will continue to follow acutely as admitted.    Follow Up Recommendations  SNF;Supervision/Assistance - 24 hour    Equipment Recommendations  Other (comment) (TBD)    Recommendations for Other Services      Precautions / Restrictions Precautions Precautions: Fall Precaution Comments: impulsive       Mobility Bed Mobility Overal bed mobility: Needs Assistance Bed  Mobility: Supine to Sit     Supine to sit: +2 for physical assistance;+2 for safety/equipment;Mod assist     General bed mobility comments: Mod A +2 to elevate trunk.  Transfers Overall transfer level: Needs assistance Equipment used: 2 person hand held assist Transfers: Sit to/from Stand Sit to Stand: Mod assist;From elevated surface         General transfer comment: Mod A to power up into standing.    Balance Overall balance assessment: Needs assistance Sitting-balance support: No upper extremity supported;Feet supported Sitting balance-Leahy Scale: Fair     Standing balance support: Bilateral upper extremity supported;During functional activity Standing balance-Leahy Scale: Poor                             ADL either performed or assessed with clinical judgement   ADL Overall ADL's : Needs assistance/impaired         Upper Body Bathing: Moderate assistance;Sitting Upper Body Bathing Details (indicate cue type and reason): Mod A for washing axillary area and back. Requiring Max cues for pt participating in washing his chest. Pt repeating that he is tired, wants to go back to bed, and his stomach hurt Lower Body Bathing: Moderate assistance;Sit to/from stand Lower Body Bathing Details (indicate cue type and reason): Mod A for posterior peri care Upper Body Dressing : Moderate assistance;Sitting Upper Body Dressing Details (indicate cue type and reason): donning new gown     Toilet Transfer: Moderate assistance;Ambulation (Simulated to recliner) Toilet Transfer Details (indicate cue type and reason): Pt with poor safety awareness and requiring Max cues and then Mod A to bring hips to recliner as pt sit prematurelly.  Functional mobility during ADLs: Moderate assistance;+2 for physical assistance General ADL Comments: Pt with urine and bowel incontience in bed upon arrival. Focused session on bathing at sink. Pt with decreased cognition, balance,  strength, and safety.     Vision   Vision Assessment?: Vision impaired- to be further tested in functional context Additional Comments: Pt reaching out for objects but not able to locate ADL items. Also demonstrating decreased attention to left visual field.    Perception     Praxis      Cognition Arousal/Alertness: Awake/alert Behavior During Therapy: Flat affect;Impulsive Overall Cognitive Status: Impaired/Different from baseline Area of Impairment: Attention;Following commands;Safety/judgement;Awareness;Problem solving;Memory;Orientation                 Orientation Level: Situation;Disoriented to;Place Current Attention Level: Sustained Memory: Decreased short-term memory;Decreased recall of precautions Following Commands: Follows one step commands inconsistently Safety/Judgement: Decreased awareness of deficits;Decreased awareness of safety Awareness: Intellectual Problem Solving: Requires tactile cues;Requires verbal cues;Slow processing;Decreased initiation;Difficulty sequencing General Comments: Pt focused on returning to bed. Pt with decreased volition to activity and requiring increased cues throughout.         Exercises     Shoulder Instructions       General Comments      Pertinent Vitals/ Pain       Pain Assessment: Faces Faces Pain Scale: Hurts a little bit Pain Location: Stomach Pain Descriptors / Indicators: Discomfort;Guarding Pain Intervention(s): Monitored during session;Limited activity within patient's tolerance;Repositioned  Home Living                                          Prior Functioning/Environment              Frequency  Min 2X/week        Progress Toward Goals  OT Goals(current goals can now be found in the care plan section)  Progress towards OT goals: Goals drowngraded-see care plan (Downgraded and updated goal acoordingly)  Acute Rehab OT Goals Patient Stated Goal: pt unable OT Goal  Formulation: Patient unable to participate in goal setting Time For Goal Achievement: 07/03/20 Potential to Achieve Goals: Poor ADL Goals Pt Will Perform Eating: with set-up;with supervision;sitting Pt Will Perform Grooming: with min assist;sitting Pt Will Perform Upper Body Bathing: with min assist;sitting Pt Will Perform Lower Body Bathing: with min assist;sit to/from stand Pt Will Perform Upper Body Dressing: with min assist;sitting Pt Will Perform Lower Body Dressing: with min assist;sit to/from stand Pt Will Transfer to Toilet: with min guard assist;ambulating;regular height toilet;grab bars Pt Will Perform Toileting - Clothing Manipulation and hygiene: with min guard assist;sit to/from stand  Plan Discharge plan remains appropriate    Co-evaluation                 AM-PAC OT "6 Clicks" Daily Activity     Outcome Measure   Help from another person eating meals?: A Lot Help from another person taking care of personal grooming?: A Lot Help from another person toileting, which includes using toliet, bedpan, or urinal?: A Lot Help from another person bathing (including washing, rinsing, drying)?: A Lot Help from another person to put on and taking off regular upper body clothing?: A Little Help from another person to put on and taking off regular lower body clothing?: A Lot 6 Click Score: 13    End of Session Equipment Utilized During Treatment: Gait belt  OT  Visit Diagnosis: Unsteadiness on feet (R26.81);Other abnormalities of gait and mobility (R26.89);Muscle weakness (generalized) (M62.81);Low vision, both eyes (H54.2);Other symptoms and signs involving cognitive function   Activity Tolerance Other (comment) (limited by cognition)   Patient Left with call bell/phone within reach;in chair;with chair alarm set   Nurse Communication Mobility status        Time: 8338-2505 OT Time Calculation (min): 23 min  Charges: OT General Charges $OT Visit: 1 Visit OT  Treatments $Self Care/Home Management : 8-22 mins  [place], OTR/L Acute Rehab Pager: [phone] Office: [place] 06/19/2020, 1:02 PM

## 2020-06-19 NOTE — Progress Notes (Signed)
PT Cancellation Note  Patient Details Name: Kenneth Sullivan MRN: [id number] DOB: 1956-11-05   Cancelled Treatment:    Reason Eval/Treat Not Completed: Other (comment)   Pt politely declining OOB/ADL, despite best attempts at connecting and getting buy-in for getting up;   Will follow up later today as time allows;  Otherwise, will follow up for PT tomorrow;   Thank you,  [name], PT  Acute Rehabilitation Services Pager [phone] Office [phone]     [place] 06/19/2020, 10:26 AM

## 2020-06-20 ENCOUNTER — Encounter (HOSPITAL_COMMUNITY): Payer: Self-pay | Admitting: Oncology

## 2020-06-20 NOTE — Progress Notes (Signed)
[facility] PROGRESS NOTE  Kenneth Sullivan PZW:[id number] DOB: 09/25/1956 DOA: 03/07/2020 PCP: Medicine, [name]  Status: Inpatient--Remains inpatient appropriate because:Altered mental status, Unsafe d/c plan and Inpatient level of care appropriate due to severity of illness   Dispo: The patient is from: Home              Anticipated d/c is to: Hospice for palliative medicine versus SNF              Anticipated d/c date is: > 3 days              Patient currently is medically stable to d/c.  **As of 7/6 there are no beds available at [facility] area SNF facilities **Palliative care documents that patient is appropriate for home hospice but niece (who would be primary caretaker) is unable to take the patient home as she is working multiple jobs.  She continues to agree with hospice care but placement and location of care remain difficult since patient has Medicaid.  HPI: 64 year old gentleman with no prior medical history initially presented to ED with shortness of breath and acute respiratory failure, had a witnessed bradycardic asystolic arrest.  He underwent normothermia protocol and was found to have massive PE and DVT with right ventricular strain.  Hospital course was initially complicated by upper GI bleed due to gastric ulcer.  CT abdomen pelvis showed a large antral ulcer, mass with multiple liver lesions and biopsy confirmed high-grade neuroendocrine tumor.  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  Patient underwent IR guided mechanical thrombectomy for his massive PE and IVC filter.  Postprocedure while on heparin drip patient started having maroon-colored red stool and bloody drainage  from the OGT.  GI consulted and he underwent endoscopy showing gastric ulcer with blood clots.  IR consulted and he underwent GDA embolization.  Hospital course was further complicated by multiple acute to subacute ischemic infarcts involving bilateral cerebral  hemispheres consistent with global hypoperfusion and also associated with scattered petechial hemorrhages with evidence of hemorrhagic conversion in the parietal lobe.  Oncology consulted and initially discussed with patient's adenocarcinoma diagnosis with the patient's niece over the phone recommended chemotherapy versus hospice.Palliative care consulted and niece agrees with hospice care.  Unfortunately she is now working multiple jobs unable to accommodate taking patient home for home hospice.  Placement has been difficult given patient having Medicare and lack of available beds.  Subjective: Sleeping.  Requested not to be awakened.  When asked if he was more of a night person and not a morning person he stated yes. Still not eating -note several trays in room that appear as if patient has only taken a few bites of food.  Objective: Vitals:   06/19/20 2139 06/20/20 0756  BP: 121/83 135/86  Pulse: (!) 102 (!) 102  Resp: 16 18  Temp: 98.9 F (37.2 C) 98.2 F (36.8 C)  SpO2: 99% 96%    Intake/Output Summary (Last 24 hours) at 06/20/2020 1045 Last data filed at 06/19/2020 1800 Gross per 24 hour  Intake 3247.23 ml  Output --  Net 3247.23 ml   Filed Weights   05/31/20 0500 06/01/20 0254 06/11/20 0513  Weight: 63.5 kg 66.7 kg 80.7 kg    Exam: Constitutional: NAD, calm, appears comfortable-briefly awaken from sleep ENMT: Mucous membranes are moist.Normal dentition.  Respiratory: clear to auscultation bilaterally, Normal respiratory effort.  Room air Cardiovascular: Regular rate and rhythm, no murmurs / rubs / gallops. No extremity edema. 2+ pedal pulses.  Normal saline at 50 cc/h Abdomen: no tenderness, no masses palpated. No hepatosplenomegaly. Bowel sounds positive.  Poor oral intake documented Musculoskeletal: no clubbing / cyanosis. No joint deformity upper and lower extremities. no contractures.  Skin: no rashes, lesions, ulcers. No induration Neurologic: CN 2-12 grossly intact.  Sensation intact, DTR normal. Strength 4/5 x all 4 extremities.  Psychiatric: Alert, oriented to name only   Assessment/Plan: Acute hypoxic respiratory failure in the setting of massive PE/cardiac arrest Status post mechanical ventilation and tracheostomy.   Now decannulated and stable on room air.  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 Currently not on any anticoagulation due to GI bleed and CVA with hemorrhagic conversion.  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  Due to issues with agitation, refusal of medications and care Seroquel was increased to 100 mg in the morning and 150 mg at bedtime on 7/5.  EKG from 7/6 with a corrected QTC of 450 ms based on RBB QRS= 122 ms and J-T= 328 ms.  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 Due to location of gastric mass it is not unexpected that patient unable to eat-recommendation is for easy to digest foods with focus on high-protein beverages and shakes  Sepsis 2/2 Streptococcus bacteremia Sepsis physiology has resolved Completed 10 days of Unasyn.  Patient remains afebrile but has mild tachycardia and leukocytosis probably secondary to malignancy and ongoing malnutrition  Constipation As needed Senokot.  Acute hypokalemia Potassium stable on replacement of 20 mEq daily with most recent reading  3.5 Magnesium 1.9  Debility/physical deconditioning 7/5: PT: Continues to work on functional mobility and activity tolerance.  Continues with poor cognition, vision, balance, functional use of bilateral upper extremities, balance, strength and activity tolerance.  Requires max cues for participating in ADLs and out of bed activity as well as moderate assist +2 for functional mobility.  He has poor safety awareness.  Documented decreased functional mobility with adjustment in PT goals. 1/5: OT: Upon arrival patient was in continent of bowel and bladder in the bed.  Continues with poor cognition, poor vision and balance, poor functional use of bilateral upper extremities, balance strength and activity tolerance.  Continues to require max cues for participating in ADLs.  Requires assist for UB and LB bathing while sitting at sink.  Required moderate assist +2 for functional mobility.  Continues to have poor safety awareness.  Severe malnutrition probably secondary to adenocarcinoma, chronic illness multiple liver lesions, metastatic disease Nutrition Status: Nutrition Problem: Severe Malnutrition Etiology: chronic illness (gastric lesion, multiple liver lesions, concern for metastatic disease) Signs/Symptoms: severe fat depletion, severe muscle depletion Interventions: Ensure Enlive (each supplement provides 350kcal and 20 grams of protein), Magic cup Gastric mass extends from antrum into prepyloric area and into pylorus and probably in the Duodenal Bulb. I was not able to see the duodenal bulb clearly.  Unlikely patient will be able to eat appropriately again given degree of cancer invasion and likely would benefit more from  comfort feeds Continue easy to digest food and high-protein beverages and shakes-on 7/5 patient had a total of 60 cc oral intake with primary fluid volume from low rate IV fluid     Data Reviewed: Basic Metabolic Panel: Recent Labs  Lab 06/19/20 0030  NA 137  K 3.5  CL  104  CO2 23  GLUCOSE 76  BUN <5*  CREATININE 0.66  CALCIUM 8.1*  MG 1.7   Liver Function Tests: Recent Labs  Lab 06/19/20 0030  AST 14*  ALT 10  ALKPHOS 70  BILITOT 1.1  PROT 5.4*  ALBUMIN 1.8*   No results for input(s): LIPASE, AMYLASE in the last 168 hours. No results for input(s): AMMONIA in the last 168 hours. CBC: Recent Labs  Lab 06/19/20 0030  WBC 13.3*  HGB 7.5*  HCT 23.2*  MCV 82.3  PLT 724*   Cardiac Enzymes: No results for input(s): CKTOTAL, CKMB, CKMBINDEX, TROPONINI in the last 168 hours. BNP (last 3 results) Recent Labs    03/07/20 0710  BNP 74.6    ProBNP (last 3 results) No results for input(s): PROBNP in the last 8760 hours.  CBG: No results for input(s): GLUCAP in the last 168 hours.  No results found for this or any previous visit (from the past 240 hour(s)).   Studies: No results found.  Scheduled Meds: . clonazePAM  0.5 mg Oral BID  . feeding supplement (ENSURE ENLIVE)  237 mL Oral QID  . ferrous sulfate  325 mg Oral Q breakfast  . FLUoxetine  10 mg Oral QHS  . OXcarbazepine  75 mg Oral BID  . pantoprazole  40 mg Oral Daily  . polyethylene glycol  17 g Oral BID  . potassium chloride  20 mEq Oral Daily  . QUEtiapine  100 mg Oral Daily  . QUEtiapine  150 mg Oral QHS  . senna-docusate  1 tablet Oral BID   Continuous Infusions: . sodium chloride    . sodium chloride 50 mL/hr at 06/20/20 0608    Principal Problem:   Cardiac arrest [facility]) Active Problems:   Encounter for central line placement   GI bleed   Acute respiratory failure (HCC)   Anoxic encephalopathy ([place])   Cerebral embolism with cerebral infarction   Pulmonary emboli ([place])   Palliative care by specialist   Goals of care, counseling/discussion   DNR (do not resuscitate)   Status post tracheostomy ([place])   Protein-calorie malnutrition, severe   Neuroendocrine cancer ([place])     Code Status: DNR Family Communication: [name] niece updated.  She is aware  that primary reason for remaining in [place] is lack of bed availability.  She has been updated on patient's poor intake in relation to location of gastric mass.  She is aware he is not having any abdominal pain and is overall comfortable.   Consultants:  Palliative medicine team  Oncology  PCCM  Procedures:  Echocardiogram  EEG  EGD  Cortrack feeding tube  Antibiotics: Anti-infectives (From admission, onward)   Start     Dose/Rate Route Frequency Ordered Stop   06/02/20 1630  Ampicillin-Sulbactam (UNASYN) 3 g in sodium chloride 0.9 % 100 mL IVPB        3 g 200 mL/hr over 30 Minutes Intravenous Every 6 hours 06/02/20 1626 06/10/20 2215   06/02/20 0645  cefTRIAXone (ROCEPHIN) 2 g in sodium chloride 0.9 % 100 mL IVPB  Status:  Discontinued        2 g 200 mL/hr over 30 Minutes  Intravenous Daily 06/02/20 0622 06/02/20 1607   06/01/20 0715  cefTRIAXone (ROCEPHIN) 1 g in sodium chloride 0.9 % 100 mL IVPB  Status:  Discontinued        1 g 200 mL/hr over 30 Minutes Intravenous Every 24 hours 06/01/20 0710 06/02/20 0622   03/26/20 1100  doxycycline (VIBRAMYCIN) 100 mg in sodium chloride 0.9 % 250 mL IVPB  Status:  Discontinued        100 mg 125 mL/hr over 120 Minutes Intravenous 2 times daily 03/26/20 1002 04/01/20 1035   03/25/20 1100  doxycycline (VIBRA-TABS) tablet 100 mg  Status:  Discontinued        100 mg Per Tube Every 12 hours 03/25/20 1009 03/26/20 1002   03/25/20 1015  doxycycline (VIBRAMYCIN) 100 mg in sodium chloride 0.9 % 250 mL IVPB  Status:  Discontinued       Note to Pharmacy: To be given after the tracheal aspirate is collected.   100 mg 125 mL/hr over 120 Minutes Intravenous Every 12 hours 03/25/20 1005 03/25/20 1008       Time spent: 15 minutes    Erin Hearing ANP [facility] Pager [phone]. If 7PM-7AM, please contact night-coverage at [email or web address] 06/20/2020, 10:45 AM  LOS: 105 days

## 2020-06-21 NOTE — Plan of Care (Signed)
  Problem: Role Relationship: Goal: Method of communication will improve Outcome: Not Applicable   Problem: Education: Goal: Knowledge of General Education information will improve Description: Including pain rating scale, medication(s)/side effects and non-pharmacologic comfort measures Outcome: Not Applicable   Problem: Health Behavior/Discharge Planning: Goal: Ability to manage health-related needs will improve Outcome: Not Applicable   Problem: Nutrition: Goal: Adequate nutrition will be maintained Outcome: Not Applicable   Problem: Coping: Goal: Level of anxiety will decrease Outcome: Not Applicable   Problem: Safety: Goal: Ability to remain free from injury will improve Outcome: Not Applicable   Problem: Education: Goal: Knowledge of disease or condition will improve Outcome: Not Applicable Goal: Knowledge of secondary prevention will improve Outcome: Not Applicable Goal: Knowledge of patient specific risk factors addressed and post discharge goals established will improve Outcome: Not Applicable Goal: Individualized Educational Video(s) Outcome: Not Applicable   Problem: Coping: Goal: Will verbalize positive feelings about self Outcome: Not Applicable Goal: Will identify appropriate support needs Outcome: Not Applicable   Problem: Health Behavior/Discharge Planning: Goal: Ability to manage health-related needs will improve Outcome: Not Applicable   Problem: Self-Care: Goal: Ability to participate in self-care as condition permits will improve Outcome: Not Applicable Goal: Verbalization of feelings and concerns over difficulty with self-care will improve Outcome: Not Applicable Goal: Ability to communicate needs accurately will improve Outcome: Not Applicable   Problem: Nutrition: Goal: Risk of aspiration will decrease Outcome: Not Applicable Goal: Dietary intake will improve Outcome: Not Applicable   Problem: Intracerebral Hemorrhage Tissue  Perfusion: Goal: Complications of Intracerebral Hemorrhage will be minimized Outcome: Not Applicable   Problem: Ischemic Stroke/TIA Tissue Perfusion: Goal: Complications of ischemic stroke/TIA will be minimized Outcome: Not Applicable   Problem: Spontaneous Subarachnoid Hemorrhage Tissue Perfusion: Goal: Complications of Spontaneous Subarachnoid Hemorrhage will be minimized Outcome: Not Applicable   Problem: Education: Goal: Knowledge of disease or condition will improve Outcome: Not Applicable Goal: Knowledge of secondary prevention will improve Outcome: Not Applicable Goal: Knowledge of patient specific risk factors addressed and post discharge goals established will improve Outcome: Not Applicable   Problem: Self-Care: Goal: Ability to participate in self-care as condition permits will improve Outcome: Not Applicable Goal: Verbalization of feelings and concerns over difficulty with self-care will improve Outcome: Not Applicable Goal: Ability to communicate needs accurately will improve Outcome: Not Applicable

## 2020-06-21 NOTE — Progress Notes (Signed)
[facility] PROGRESS NOTE  Kenneth Sullivan YYT:[id number] DOB: 1956-09-10 DOA: 03/07/2020 PCP: Medicine, [name]  Status: Inpatient--Remains inpatient appropriate because:Altered mental status, Unsafe d/c plan and Inpatient level of care appropriate due to severity of illness   Dispo: The patient is from: Home              Anticipated d/c is to: Hospice for palliative medicine versus SNF              Anticipated d/c date is: > 3 days              Patient currently is medically stable to d/c.  **No beds available at [facility] area SNF facilities **Home hospice considered but niece (who would be primary caretaker) is unable to take the patient home as she is working multiple jobs.  She continues to agree with hospice care but placement and location of care remain difficult since patient has Medicaid.  HPI: 64 year old gentleman with no prior medical history initially presented to ED with shortness of breath and acute respiratory failure, had a witnessed bradycardic asystolic arrest.  He underwent normothermia protocol and was found to have massive PE and DVT with right ventricular strain.  Hospital course was initially complicated by upper GI bleed due to gastric ulcer.  CT abdomen pelvis showed a large antral ulcer, mass with multiple liver lesions and biopsy confirmed high-grade neuroendocrine tumor.  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  Patient underwent IR guided mechanical thrombectomy for his massive PE and IVC filter.  Postprocedure while on heparin drip patient started having maroon-colored red stool and bloody drainage  from the OGT.  GI consulted and he underwent endoscopy showing gastric ulcer with blood clots.  IR consulted and he underwent GDA embolization.  Hospital course was further complicated by multiple acute to subacute ischemic infarcts involving bilateral cerebral hemispheres consistent with global hypoperfusion and also associated  with scattered petechial hemorrhages with evidence of hemorrhagic conversion in the parietal lobe.  Oncology consulted and initially discussed with patient's adenocarcinoma diagnosis with the patient's niece over the phone recommended chemotherapy versus hospice.Palliative care consulted and niece agrees with hospice care.  Unfortunately she is now working multiple jobs unable to accommodate taking patient home for home hospice.  Placement has been difficult given patient having Medicare and lack of available beds.  Subjective: Awake and alert for him States is not hungry Discussed with him need to use liquid protein shakes over solid food noting he would tolerate better.  Unsure if patient understood what I was telling him.  Objective: Vitals:   06/21/20 0735 06/21/20 1124  BP: 137/82 130/89  Pulse: (!) 102 (!) 103  Resp: 16 18  Temp: 97.9 F (36.6 C)   SpO2: 100% 97%    Intake/Output Summary (Last 24 hours) at 06/21/2020 1231 Last data filed at 06/20/2020 2039 Gross per 24 hour  Intake 0 ml  Output --  Net 0 ml   Filed Weights   05/31/20 0500 06/01/20 0254 06/11/20 0513  Weight: 63.5 kg 66.7 kg 80.7 kg    Exam: Constitutional: NAD, calm, appears comfortable-briefly awaken from sleep ENMT: Mucous membranes are moist.Normal dentition.  Respiratory: clear to auscultation bilaterally, Normal respiratory effort.  Room air Cardiovascular: Regular rate and rhythm, no murmurs / rubs / gallops. No extremity edema. 2+ pedal pulses.  Normal saline at 50 cc/h Abdomen: no tenderness, no masses palpated. No hepatosplenomegaly. Bowel sounds positive.  Needs with poor oral intake. Musculoskeletal: no clubbing /  cyanosis. No joint deformity upper and lower extremities. no contractures.  Skin: no rashes, lesions, ulcers. No induration Neurologic: CN 2-12 grossly intact. Sensation intact, DTR normal. Strength 4/5 x all 4 extremities.  Psychiatric: Alert, oriented to name  only   Assessment/Plan: Acute hypoxic respiratory failure in the setting of massive PE/cardiac arrest Status post mechanical ventilation and tracheostomy.   Resolved Now decannulated and stable on room air.  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 Currently not on any anticoagulation due to GI bleed and CVA with hemorrhagic conversion.  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  Due to issues with agitation, refusal of medications and care Seroquel was increased to 100 mg in the morning and 150 mg at bedtime on 7/5 with significant improvement in behavior EKG from 7/6 with a corrected QTC of 450 ms based on RBB QRS= 122 ms and J-T= 328 ms.  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 Due to location of gastric mass it is not unexpected that patient unable to eat-recommendation is for easy to digest foods with focus on high-protein beverages and shakes  Sepsis 2/2 Streptococcus bacteremia Sepsis physiology has resolved Completed 10 days of Unasyn.  Patient remains afebrile but has mild tachycardia and leukocytosis probably secondary to malignancy and ongoing malnutrition  Constipation As needed Senokot.  Acute hypokalemia Potassium stable on replacement of 20 mEq daily with most recent reading 3.5 Magnesium 1.9  Debility/physical  deconditioning 7/5: PT: Continues to work on functional mobility and activity tolerance.  Continues with poor cognition, vision, balance, functional use of bilateral upper extremities, balance, strength and activity tolerance.  Requires max cues for participating in ADLs and out of bed activity as well as moderate assist +2 for functional mobility.  He has poor safety awareness.  Documented decreased functional mobility with adjustment in PT goals. 7/5: OT: Upon arrival patient was in continent of bowel and bladder in the bed.  Continues with poor cognition, poor vision and balance, poor functional use of bilateral upper extremities, balance strength and activity tolerance.  Continues to require max cues for participating in ADLs.  Requires assist for UB and LB bathing while sitting at sink.  Required moderate assist +2 for functional mobility.  Continues to have poor safety awareness.  Severe malnutrition probably secondary to adenocarcinoma, chronic illness multiple liver lesions, metastatic disease Nutrition Status: Nutrition Problem: Severe Malnutrition Etiology: chronic illness (gastric lesion, multiple liver lesions, concern for metastatic disease) Signs/Symptoms: severe fat depletion, severe muscle depletion Interventions: Ensure Enlive (each supplement provides 350kcal and 20 grams of protein), Magic cup Gastric mass extends from antrum into prepyloric area and into pylorus and probably in the Duodenal Bulb. I was not able to see the duodenal bulb clearly.  Unlikely patient will be able to eat appropriately again given degree of cancer invasion and likely would benefit more from comfort feeds Continue easy to digest food and high-protein beverages and shakes-patient with very minimal oral intake and only is  remaining hydrated based on low flow IV fluids currently in place     Data Reviewed: Basic Metabolic Panel: Recent Labs  Lab 06/19/20 0030  NA 137  K 3.5  CL 104  CO2 23  GLUCOSE  76  BUN <5*  CREATININE 0.66  CALCIUM 8.1*  MG 1.7   Liver Function Tests: Recent Labs  Lab 06/19/20 0030  AST 14*  ALT 10  ALKPHOS 70  BILITOT 1.1  PROT 5.4*  ALBUMIN 1.8*   No results for input(s): LIPASE, AMYLASE in the last 168 hours. No results for input(s): AMMONIA in the last 168 hours. CBC: Recent Labs  Lab 06/19/20 0030  WBC 13.3*  HGB 7.5*  HCT 23.2*  MCV 82.3  PLT 724*   Cardiac Enzymes: No results for input(s): CKTOTAL, CKMB, CKMBINDEX, TROPONINI in the last 168 hours. BNP (last 3 results) Recent Labs    03/07/20 0710  BNP 74.6    ProBNP (last 3 results) No results for input(s): PROBNP in the last 8760 hours.  CBG: No results for input(s): GLUCAP in the last 168 hours.  No results found for this or any previous visit (from the past 240 hour(s)).   Studies: No results found.  Scheduled Meds:  clonazePAM  0.5 mg Oral BID   feeding supplement (ENSURE ENLIVE)  237 mL Oral QID   ferrous sulfate  325 mg Oral Q breakfast   FLUoxetine  10 mg Oral QHS   OXcarbazepine  75 mg Oral BID   pantoprazole  40 mg Oral Daily   polyethylene glycol  17 g Oral BID   potassium chloride  20 mEq Oral Daily   QUEtiapine  100 mg Oral Daily   QUEtiapine  150 mg Oral QHS   senna-docusate  1 tablet Oral BID   Continuous Infusions:  sodium chloride     sodium chloride 50 mL/hr at 06/20/20 0608    Principal Problem:   Cardiac arrest [facility]) Active Problems:   Encounter for central line placement   GI bleed   Acute respiratory failure (HCC)   Anoxic encephalopathy ([place])   Cerebral embolism with cerebral infarction   Pulmonary emboli ([place])   Palliative care by specialist   Goals of care, counseling/discussion   DNR (do not resuscitate)   Status post tracheostomy ([employer])   Protein-calorie malnutrition, severe   Neuroendocrine cancer ([place])     Code Status: DNR Family Communication: Kenneth Sullivan niece updated.  She is aware that primary reason  for remaining in [place] is lack of bed availability.  She has been updated on patient's poor intake in relation to location of gastric mass.  She is aware he is not having any abdominal pain and is overall comfortable.   Consultants:  Palliative medicine team  Oncology  PCCM  Procedures:  Echocardiogram  EEG  EGD  Cortrack feeding tube  Antibiotics: Anti-infectives (From admission, onward)   Start     Dose/Rate Route Frequency Ordered Stop   06/02/20 1630  Ampicillin-Sulbactam (UNASYN) 3 g in sodium chloride 0.9 % 100 mL IVPB        3 g 200 mL/hr over 30 Minutes Intravenous Every 6 hours 06/02/20 1626 06/10/20 2215   06/02/20 0645  cefTRIAXone (ROCEPHIN) 2 g in sodium chloride 0.9 % 100 mL IVPB  Status:  Discontinued        2 g 200 mL/hr over 30 Minutes Intravenous Daily 06/02/20 0622 06/02/20 1607   06/01/20 0715  cefTRIAXone (ROCEPHIN) 1 g in sodium chloride 0.9 %  100 mL IVPB  Status:  Discontinued        1 g 200 mL/hr over 30 Minutes Intravenous Every 24 hours 06/01/20 0710 06/02/20 0622   03/26/20 1100  doxycycline (VIBRAMYCIN) 100 mg in sodium chloride 0.9 % 250 mL IVPB  Status:  Discontinued        100 mg 125 mL/hr over 120 Minutes Intravenous 2 times daily 03/26/20 1002 04/01/20 1035   03/25/20 1100  doxycycline (VIBRA-TABS) tablet 100 mg  Status:  Discontinued        100 mg Per Tube Every 12 hours 03/25/20 1009 03/26/20 1002   03/25/20 1015  doxycycline (VIBRAMYCIN) 100 mg in sodium chloride 0.9 % 250 mL IVPB  Status:  Discontinued       Note to Pharmacy: To be given after the tracheal aspirate is collected.   100 mg 125 mL/hr over 120 Minutes Intravenous Every 12 hours 03/25/20 1005 03/25/20 1008       Time spent: 15 minutes    Erin Hearing ANP [facility] Pager [phone]. If 7PM-7AM, please contact night-coverage at [email or web address] 06/21/2020, 12:31 PM  LOS: 106 days

## 2020-06-22 NOTE — Progress Notes (Addendum)
[facility] PROGRESS NOTE  Kenneth Sullivan BWI:[id number] DOB: August 31, 1956 DOA: 03/07/2020 PCP: Medicine, [name]  Status: Inpatient--Remains inpatient appropriate because:Altered mental status, Unsafe d/c plan and Inpatient level of care appropriate due to severity of illness   Dispo: The patient is from: Home              Anticipated d/c is to: Hospice for palliative medicine versus SNF              Anticipated d/c date is: > 3 days              Patient currently is medically stable to d/c.  **No beds available at [facility] area SNF facilities **Home hospice considered but niece (who would be primary caretaker) is unable to take the patient home as she is working multiple jobs.  She agrees with hospice care but placement and location of care remain difficult since patient has Medicaid.  HPI: 64 year old gentleman with no prior medical history initially presented to ED with shortness of breath and acute respiratory failure, had a witnessed bradycardic asystolic arrest.  He underwent normothermia protocol and was found to have massive PE and DVT with right ventricular strain.  Hospital course was initially complicated by upper GI bleed due to gastric ulcer.  CT abdomen pelvis showed a large antral ulcer, mass with multiple liver lesions and biopsy confirmed high-grade neuroendocrine tumor.  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  Patient underwent IR guided mechanical thrombectomy for his massive PE and IVC filter.  Postprocedure while on heparin drip patient started having maroon-colored red stool and bloody drainage  from the OGT.  GI consulted and he underwent endoscopy showing gastric ulcer with blood clots.  IR consulted and he underwent GDA embolization.  Hospital course was further complicated by multiple acute to subacute ischemic infarcts involving bilateral cerebral hemispheres consistent with global hypoperfusion and also associated with scattered  petechial hemorrhages with evidence of hemorrhagic conversion in the parietal lobe.  Oncology consulted and initially discussed with patient's adenocarcinoma diagnosis with the patient's niece over the phone recommended chemotherapy versus hospice.Palliative care consulted and niece agrees with hospice care.  Unfortunately she is now working multiple jobs unable to accommodate taking patient home for home hospice.  Placement has been difficult given patient having Medicare and lack of available beds.  Subjective: Awake and restless this morning Requesting a urinal but calling it a "bucket"-offered to have condom catheter placed after he finishes urinating but he stated "it is too late" Stated he would eat more today.  Objective: Vitals:   06/21/20 2105 06/22/20 0816  BP: 132/79 137/86  Pulse: (!) 111 (!) 101  Resp: 18 19  Temp: 99.2 F (37.3 C) 98.7 F (37.1 C)  SpO2: 93% 94%    Intake/Output Summary (Last 24 hours) at 06/22/2020 1102 Last data filed at 06/22/2020 0947 Gross per 24 hour  Intake 1796.03 ml  Output --  Net 1796.03 ml   Filed Weights   05/31/20 0500 06/01/20 0254 06/11/20 0513  Weight: 63.5 kg 66.7 kg 80.7 kg    Exam: Constitutional: NAD, awake and restless in [place] to acute toileting needs ENMT: Mucous membranes are moist.Normal dentition.  Respiratory: clear to auscultation bilaterally, Normal respiratory effort.  Room air Cardiovascular: Regular rate and rhythm, no murmurs / rubs / gallops. No extremity edema. 2+ pedal pulses.  Normal saline at 50 cc/h Abdomen: no tenderness, no masses palpated. No hepatosplenomegaly. Bowel sounds positive.  Needs with poor oral intake. Musculoskeletal:  no clubbing / cyanosis. No joint deformity upper and lower extremities. no contractures.  Skin: no rashes, lesions, ulcers. No induration Neurologic: CN 2-12 grossly intact. Sensation intact, DTR normal. Strength 4/5 x all 4 extremities.  Psychiatric: Alert, oriented to name  only   Assessment/Plan: Acute hypoxic respiratory failure in the setting of massive PE/cardiac arrest Status post mechanical ventilation and tracheostomy.   Resolved Now decannulated and stable on room air.  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 Currently not on any anticoagulation due to GI bleed and CVA w/ hemorrhagic conversion.  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   Continue Seroquel 100 mg am and 150 mg HS EKG from 7/6 with a corrected QTC of 450 ms based on RBB QRS= 122 ms and J-T= 328 ms.  Repeat EKG again in a.m. 7/9.  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 Due to location of gastric mass it is not unexpected that patient unable to eat-recommendation is for easy to digest foods with focus on high-protein beverages and shakes  Sepsis 2/2 Streptococcus bacteremia Resolved Completed 10 days of Unasyn.  Patient remains afebrile but has mild tachycardia and leukocytosis probably secondary to malignancy and ongoing malnutrition  Constipation As needed Senokot.  Acute hypokalemia Potassium stable on replacement of 20 mEq daily with most recent reading 3.5 Magnesium 1.9  Debility/physical deconditioning 7/5: PT: Continues to work on functional mobility and activity tolerance.  Continues with poor cognition, vision, balance,  functional use of bilateral upper extremities, balance, strength and activity tolerance.  Requires max cues for participating in ADLs and out of bed activity as well as moderate assist +2 for functional mobility.  He has poor safety awareness.  Documented decreased functional mobility with adjustment in PT goals. 7/5: OT: Upon arrival patient was in continent of bowel and bladder in the bed.  Continues with poor cognition, poor vision and balance, poor functional use of bilateral upper extremities, balance strength and activity tolerance.  Continues to require max cues for participating in ADLs.  Requires assist for UB and LB bathing while sitting at sink.  Required moderate assist +2 for functional mobility.  Continues to have poor safety awareness.  Severe malnutrition probably secondary to adenocarcinoma, chronic illness multiple liver lesions, metastatic disease Nutrition Status: Nutrition Problem: Severe Malnutrition Etiology: chronic illness (gastric lesion, multiple liver lesions, concern for metastatic disease) Signs/Symptoms: severe fat depletion, severe muscle depletion Interventions: Ensure Enlive (each supplement provides 350kcal and 20 grams of protein), Magic cup Gastric mass extends from antrum into prepyloric area and into pylorus and probably in the Duodenal Bulb. I was not able to see the duodenal bulb clearly.  Unlikely patient will be able to eat appropriately again given degree of cancer invasion and likely would benefit more from comfort feeds Continue easy to digest food and high-protein beverages and shakes-patient with very minimal oral intake and only is remaining hydrated based on low flow IV fluids currently in place  Data Reviewed: Basic Metabolic Panel: Recent Labs  Lab 06/19/20 0030  NA 137  K 3.5  CL 104  CO2 23  GLUCOSE 76  BUN <5*  CREATININE 0.66  CALCIUM 8.1*  MG 1.7   Liver Function Tests: Recent Labs  Lab 06/19/20 0030  AST 14*  ALT 10   ALKPHOS 70  BILITOT 1.1  PROT 5.4*  ALBUMIN 1.8*   No results for input(s): LIPASE, AMYLASE in the last 168 hours. No results for input(s): AMMONIA in the last 168 hours. CBC: Recent Labs  Lab 06/19/20 0030  WBC 13.3*  HGB 7.5*  HCT 23.2*  MCV 82.3  PLT 724*   Cardiac Enzymes: No results for input(s): CKTOTAL, CKMB, CKMBINDEX, TROPONINI in the last 168 hours. BNP (last 3 results) Recent Labs    03/07/20 0710  BNP 74.6    ProBNP (last 3 results) No results for input(s): PROBNP in the last 8760 hours.  CBG: No results for input(s): GLUCAP in the last 168 hours.  No results found for this or any previous visit (from the past 240 hour(s)).   Studies: No results found.  Scheduled Meds: . clonazePAM  0.5 mg Oral BID  . feeding supplement (ENSURE ENLIVE)  237 mL Oral QID  . ferrous sulfate  325 mg Oral Q breakfast  . FLUoxetine  10 mg Oral QHS  . OXcarbazepine  75 mg Oral BID  . pantoprazole  40 mg Oral Daily  . polyethylene glycol  17 g Oral BID  . potassium chloride  20 mEq Oral Daily  . QUEtiapine  100 mg Oral Daily  . QUEtiapine  150 mg Oral QHS  . senna-docusate  1 tablet Oral BID   Continuous Infusions: . sodium chloride    . sodium chloride 50 mL/hr at 06/22/20 0086    Principal Problem:   Cardiac arrest [facility]) Active Problems:   Encounter for central line placement   GI bleed   Acute respiratory failure (HCC)   Anoxic encephalopathy ([place])   Cerebral embolism with cerebral infarction   Pulmonary emboli ([place])   Palliative care by specialist   Goals of care, counseling/discussion   DNR (do not resuscitate)   Status post tracheostomy ([place])   Protein-calorie malnutrition, severe   Neuroendocrine cancer ([place])     Code Status: DNR Family Communication: [name] niece updated.  She is aware that primary reason for remaining in [place] is lack of bed availability.  She has been updated on patient's poor intake in relation to location of  gastric mass.  She is aware he is not having any abdominal pain and is overall comfortable.   Consultants:  Palliative medicine team  Oncology  PCCM  Procedures:  Echocardiogram  EEG  EGD  Cortrack feeding tube  Antibiotics: Anti-infectives (From admission, onward)   Start     Dose/Rate Route Frequency Ordered Stop   06/02/20 1630  Ampicillin-Sulbactam (UNASYN) 3 g in sodium chloride 0.9 % 100 mL IVPB        3 g 200 mL/hr over 30 Minutes Intravenous Every 6 hours 06/02/20 1626 06/10/20 2215   06/02/20 0645  cefTRIAXone (ROCEPHIN) 2 g in sodium chloride 0.9 % 100 mL IVPB  Status:  Discontinued        2 g 200 mL/hr over 30 Minutes Intravenous Daily 06/02/20 0622 06/02/20 1607   06/01/20 0715  cefTRIAXone (ROCEPHIN) 1 g in sodium chloride 0.9 % 100 mL IVPB  Status:  Discontinued        1  g 200 mL/hr over 30 Minutes Intravenous Every 24 hours 06/01/20 0710 06/02/20 0622   03/26/20 1100  doxycycline (VIBRAMYCIN) 100 mg in sodium chloride 0.9 % 250 mL IVPB  Status:  Discontinued        100 mg 125 mL/hr over 120 Minutes Intravenous 2 times daily 03/26/20 1002 04/01/20 1035   03/25/20 1100  doxycycline (VIBRA-TABS) tablet 100 mg  Status:  Discontinued        100 mg Per Tube Every 12 hours 03/25/20 1009 03/26/20 1002   03/25/20 1015  doxycycline (VIBRAMYCIN) 100 mg in sodium chloride 0.9 % 250 mL IVPB  Status:  Discontinued       Note to Pharmacy: To be given after the tracheal aspirate is collected.   100 mg 125 mL/hr over 120 Minutes Intravenous Every 12 hours 03/25/20 1005 03/25/20 1008       Time spent: 15 minutes    Erin Hearing ANP [facility] Pager [phone]. If 7PM-7AM, please contact night-coverage at [email or web address] 06/22/2020, 11:02 AM  LOS: 107 days

## 2020-06-22 NOTE — TOC Progression Note (Signed)
Transition of Care [facility]) - Progression Note    Patient Details  Name: Marky Buresh MRN: [id number] Date of Birth: 03/20/1956  Transition of Care [facility]) CM/SW [place], RN Phone Number: [phone]  06/22/2020, 3:29 PM  Clinical Narrative:    CM attempted to call niece [name] to discuss disposition plan and no bed offers near [place]. Phone goes to voicemail that has not been set up.    Expected Discharge Plan: Skilled Nursing Facility Barriers to Discharge: No SNF bed  Expected Discharge Plan and Services Expected Discharge Plan: [place] In-house Referral: Clinical Social Work     Living arrangements for the past 2 months: Apartment                                       Social Determinants of Health (SDOH) Interventions    Readmission Risk Interventions No flowsheet data found.

## 2020-06-22 NOTE — Progress Notes (Signed)
Nutrition Follow-up  DOCUMENTATION CODES:   Underweight, Severe malnutrition in context of chronic illness  INTERVENTION:   Unable to place Cortrak given severity of gastric cancer.    Feeding assistance with meals and snacks  Ensure Enlive poQID, each supplement provides 350 kcal and 20 grams of protein  MagicCup TID with meals, each supplement provides 290 kcal and 9 grams of protein  NUTRITION DIAGNOSIS:   Severe Malnutrition related to chronic illness (gastric lesion, multiple liver lesions, concern for metastatic disease) as evidenced by severe fat depletion, severe muscle depletion.  Ongoing  GOAL:   Patient will meet greater than or equal to 90% of their needs  Not meeting  MONITOR:   PO intake, Supplement acceptance, Labs, Weight trends  REASON FOR ASSESSMENT:   Ventilator    ASSESSMENT:   64 yo male admitted S/P cardiac arrest, S/P normothermia protocol. Found to have massive PE, DVT, GI bleed from gastric ulcer. PMH includes current smoker, heavy alcohol use.  4/02 - trach  4/09 - Cortrak placed  4/11- pt pulled out trach, replaced by RT,TF held due to Cortrak leaking  4/13 - MBS, Dysphagia 1 with thin liquids started 4/14 - Cortrak removed 4/16 - Cortrak replaced 4/22 - Cortrak clogged, replaced by diagnostic radiology (tip in pre-pyloric region of stomach) 4/25 - diet advanced to Dysphagia 2 4/27 - tube feeds changed to nocturnal 4/28 - decannulated 4/30 - pulled Cortrak, TF stopped 5/03 - diet upgraded to Dysphagia 3 5/05 - diet upgraded to Regular 5/19 - s/p liver lesion biopsy by IR 5/28- EGD reveals gastric adenocarcinoma  Per GI, unlikely pt will be able to eat appropriately again given degree of cancer invasion and would likely benefit from comfort feeds. Diet downgraded to DYS 3. Meal completions charted as 0% for the past three meals. Taking Ensure inconsistently. [place] regarding feeding have yet to be determined. Unable to place  Cortrak. Last option would be TPN if family wishes to be aggressive, this is not a long term solution.   Awaiting placement to SNF with hospice.   Admission weight: 73.6 kg  Current weight: 80.7 kg (taken 6/27)  Drips: NS @ 50 ml/hr  Medications: miralax, 20 mEq KCl daily, senokot Labs: reviewed  Diet Order:   Diet Order            DIET DYS 3 Room service appropriate? Yes; Fluid consistency: Thin  Diet effective now                 EDUCATION NEEDS:   Not appropriate for education at this time  Skin:  Skin Assessment: Reviewed RN Assessment Skin Integrity Issues:: Other (Comment) Other: puncture neck, groin  Last BM:  7/6  Height:   Ht Readings from Last 1 Encounters:  04/29/20 6\' 2"  (1.88 m)    Weight:   Wt Readings from Last 1 Encounters:  06/11/20 80.7 kg    Ideal Body Weight:  86.4 kg  BMI:  Body mass index is 22.85 kg/m.  Estimated Nutritional Needs:   Kcal:  2100-2300  Protein:  110-125 gm  Fluid:  >/= 2 L   Kenneth Sullivan RD, LDN Clinical Nutrition Pager listed in [place]

## 2020-06-22 NOTE — Progress Notes (Signed)
Occupational Therapy Treatment Patient Details Name: Kenneth Sullivan MRN: [id number] DOB: [birth year] Today's Date: 06/22/2020    History of present illness 64 y.o. male admitted 03/07/20 with acute SOB; had bradycardic event with asystole/cardiac arrest, ROSC achieved in 20 minutes with 3 rounds epinephrine; intubated 3/23. Pt with massive PE/DVT and RV strain. S/p IR guided mechanical thrombectomy and IVC filter placement. Devoloped gastric ulcer s/p GDA embolization. MRI 3/27 revealed multifocal acute to subacute ischemic infarct involving bilateral cerebral hemisphere consistent with global hypoperfusion; associated scattered petechial hemorrhages with evidence of hemmorhagic conversion in R parietal lobe. S/p trach 4/5, changed to cuffless 4/22; decannulated 4/28. Liver biopsy 5/19. 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 On 06/01/20 - pt with UTI.  On 06/08/20 - pt continues to have a fever, mention of possible hospice in notes if does not improve. PMH unknown.   OT comments  Pt received in bed upon arrival, stating that he needed to go to the bathroom. Pt oriented to him having condom catheter in place. Due to ineffective catheter, pt required cleanup and linen change. Pt stood for pericare with modA from therapist. He required max multimodal cues for completion of pericare. Pt completed sit<>stand x3 during session with modA to powerup and for stability in standing. Following toileting pt initiated return to supine and declined additional therapy this date. Pt will continue to benefit from skilled OT services to maximize safety and independence with ADL/IADL and functional mobility. Will continue to follow acutely and progress as tolerated.    Follow Up Recommendations  SNF;Supervision/Assistance - 24 hour    Equipment Recommendations  Other (comment) (defer to next venue)    Recommendations for Other Services       Precautions / Restrictions Precautions Precautions: Fall Precaution Comments: impulsive Restrictions Weight Bearing Restrictions: No       Mobility Bed Mobility Overal bed mobility: Needs Assistance Bed Mobility: Supine to Sit;Sit to Supine     Supine to sit: Mod assist Sit to supine: Min guard   General bed mobility comments: modA to progress trunk to upright posture  Transfers Overall transfer level: Needs assistance Equipment used: 1 person hand held assist Transfers: Sit to/from Stand Sit to Stand: Mod assist;From elevated surface         General transfer comment: Mod A to power up into standing.    Balance Overall balance assessment: Needs assistance Sitting-balance support: No upper extremity supported;Feet supported Sitting balance-Leahy Scale: Fair Sitting balance - Comments: sat EOB for 5 min with minguard assistance   Standing balance support: Single extremity supported;During functional activity Standing balance-Leahy Scale: Poor Standing balance comment: modA with single UE supported in standing                           ADL either performed or assessed with clinical judgement   ADL Overall ADL's : Needs assistance/impaired                         Toilet Transfer: Moderate assistance Toilet Transfer Details (indicate cue type and reason): pt condom catheter ineffective and leaking;pt stood to then complete pericare and stood for linen change Toileting- Clothing Manipulation and Hygiene: Moderate assistance;Sit to/from stand Toileting - Clothing Manipulation Details (indicate cue type and reason): modA for stability;pt with posterior lean;pt required max multimodal cues to complete pericare in standing       General ADL  Comments: pt required modA for support in standing during ADL completion     Vision   Vision Assessment?: Vision impaired- to be further tested in functional context Additional Comments: pt continuing to  demonstrate functional limitations including decreased depth perception and left inattention;unable to follow commands for formal testing   Perception     Praxis      Cognition Arousal/Alertness: Awake/alert Behavior During Therapy: Flat affect;Impulsive Overall Cognitive Status: Impaired/Different from baseline Area of Impairment: Attention;Following commands;Safety/judgement;Awareness;Problem solving;Memory;Orientation                 Orientation Level: Situation;Disoriented to;Place Current Attention Level: Sustained Memory: Decreased short-term memory;Decreased recall of precautions Following Commands: Follows one step commands inconsistently Safety/Judgement: Decreased awareness of deficits;Decreased awareness of safety Awareness: Intellectual Problem Solving: Requires tactile cues;Requires verbal cues;Slow processing;Decreased initiation;Difficulty sequencing General Comments: pt continues to require cues for correct sequencing of tasks;pt prompted to complete pericare, pt continuously stating "i can't" but then quickly completed task        Exercises Exercises: General Upper Extremity General Exercises - Upper Extremity Shoulder Flexion: Left;10 reps;Seated;AAROM Shoulder Extension: Left;10 reps;Seated;AAROM Elbow Flexion: Left;10 reps;Seated;AAROM Elbow Extension: Left;10 reps;[facility]   Shoulder Instructions       General Comments vss    Pertinent Vitals/ Pain       Pain Assessment: Faces Faces Pain Scale: Hurts a little bit Pain Location: generalized with movement Pain Descriptors / Indicators: Grimacing Pain Intervention(s): Monitored during session;Limited activity within patient's tolerance;Repositioned  Home Living                                          Prior Functioning/Environment              Frequency  Min 2X/week        Progress Toward Goals  OT Goals(current goals can now be found in the care plan  section)  Progress towards OT goals: Progressing toward goals  Acute Rehab OT Goals Patient Stated Goal: pt did not state OT Goal Formulation: With patient Time For Goal Achievement: 07/03/20 Potential to Achieve Goals: Poor ADL Goals Pt Will Perform Eating: with set-up;with supervision;sitting Pt Will Perform Grooming: with min assist;sitting Pt Will Perform Upper Body Bathing: with min assist;sitting Pt Will Perform Lower Body Bathing: with min assist;sit to/from stand Pt Will Perform Upper Body Dressing: with min assist;sitting Pt Will Perform Lower Body Dressing: with min assist;sit to/from stand Pt Will Transfer to Toilet: with min guard assist;ambulating;regular height toilet;grab bars Pt Will Perform Toileting - Clothing Manipulation and hygiene: with min guard assist;sit to/from stand  Plan Discharge plan remains appropriate    Co-evaluation                 AM-PAC OT "6 Clicks" Daily Activity     Outcome Measure   Help from another person eating meals?: A Lot Help from another person taking care of personal grooming?: A Lot Help from another person toileting, which includes using toliet, bedpan, or urinal?: A Lot Help from another person bathing (including washing, rinsing, drying)?: A Lot Help from another person to put on and taking off regular upper body clothing?: A Little Help from another person to put on and taking off regular lower body clothing?: A Lot 6 Click Score: 13    End of Session    OT Visit Diagnosis: Unsteadiness on feet (R26.81);Other abnormalities of gait and  mobility (R26.89);Muscle weakness (generalized) (M62.81);Low vision, both eyes (H54.2);Other symptoms and signs involving cognitive function   Activity Tolerance Patient tolerated treatment well   Patient Left in bed;with call bell/phone within reach;with nursing/sitter in room   Nurse Communication Mobility status        Time: 1470-9295 OT Time Calculation (min): 18  min  Charges: OT General Charges $OT Visit: 1 Visit OT Treatments $Self Care/Home Management : 8-22 mins  Helene Kelp OTR/L Acute Rehabilitation Services Office: [place] 06/22/2020, 3:06 PM

## 2020-06-22 NOTE — Progress Notes (Signed)
[place] is yellow per prior RN. Will continue with routine VS and continue to monitor.

## 2020-06-22 NOTE — Plan of Care (Signed)
Cognitive impairment makes education and self care aspects of care plan impossible.

## 2020-06-22 NOTE — Progress Notes (Signed)
PT Cancellation Note  Patient Details Name: Kenneth Sullivan MRN: [id number] DOB: [birth year]   Cancelled Treatment:    Reason Eval/Treat Not Completed: Patient declined, no reason specified  Pt declined any OOB activity today.  Tried to educate, encourage, or see if pt needed up to restroom.  Pt stated "stop trying to trick me into walking."  Assured pt that only trying to help him get stronger/maintain mobility.  Will follow up as able.  Abran Richard, PT Acute Rehab Services Pager [phone] [facility] [place] 06/22/2020, 11:19 AM

## 2020-06-23 LAB — RENAL FUNCTION PANEL
Albumin: 1.7 g/dL — ABNORMAL LOW (ref 3.5–5.0)
Anion gap: 8 (ref 5–15)
BUN: <5 mg/dL — ABNORMAL LOW (ref 8–23)
CO2: 26 mmol/L (ref 22–32)
Calcium: 8.2 mg/dL — ABNORMAL LOW (ref 8.9–10.3)
Chloride: 101 mmol/L (ref 98–111)
Creatinine, Ser: 0.56 mg/dL — ABNORMAL LOW (ref 0.61–1.24)
GFR calc Af Amer: >60 mL/min (ref >60)
GFR calc non Af Amer: >60 mL/min (ref >60)
Glucose, Bld: 89 mg/dL (ref 70–99)
Phosphorus: 3.6 mg/dL (ref 2.5–4.6)
Potassium: 3.5 mmol/L (ref 3.5–5.1)
Sodium: 135 mmol/L (ref 135–145)

## 2020-06-23 NOTE — Progress Notes (Signed)
PT Cancellation Note  Patient Details Name: Kenneth Sullivan MRN: [id number] DOB: 07-29-1956   Cancelled Treatment:    Reason Eval/Treat Not Completed: Patient declined, no reason specified. Pt declined x2 today, reporting pain and fatigue both times. The pt was educated on the importance to continuing to mobilize OOB to maintain strength and independence, and pt verbalized agreement. PT will continue to follow and treat as time/schedule allow.   Karma Ganja, PT, DPT   Acute Rehabilitation Department Pager #: [phone]   Otho Bellows 06/23/2020, 12:13 PM

## 2020-06-23 NOTE — Progress Notes (Signed)
[facility] PROGRESS NOTE  Kenneth Sullivan CWC:[id number] DOB: November 04, 1956 DOA: 03/07/2020 PCP: [facility], [name]  Status: Inpatient--Remains inpatient appropriate because:Altered mental status, Unsafe d/c plan and Inpatient level of care appropriate due to severity of illness   Dispo: The patient is from: Home              Anticipated d/c is to: SNF with hospice              Anticipated d/c date is: > 3 days              Patient currently is medically stable to d/c.  **No beds available at [facility] area SNF facilities **Home hospice considered but niece (who would be primary caretaker) is unable to take the patient home as she is working multiple jobs.  She agrees with hospice care but placement and location of care remain difficult since patient has Medicaid.  HPI: 64 year old gentleman with no prior medical history initially presented to ED with shortness of breath and acute respiratory failure, had a witnessed bradycardic asystolic arrest.  He underwent normothermia protocol and was found to have massive PE and DVT with right ventricular strain.  Hospital course was initially complicated by upper GI bleed due to gastric ulcer.  CT abdomen pelvis showed a large antral ulcer, mass with multiple liver lesions and biopsy confirmed high-grade neuroendocrine tumor.  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  Patient underwent IR guided mechanical thrombectomy for his massive PE and IVC filter.  Postprocedure while on heparin drip patient started having maroon-colored red stool and bloody drainage  from the OGT.  GI consulted and he underwent endoscopy showing gastric ulcer with blood clots.  IR consulted and he underwent GDA embolization.  Hospital course was further complicated by multiple acute to subacute ischemic infarcts involving bilateral cerebral hemispheres consistent with global hypoperfusion and also associated with scattered petechial hemorrhages with  evidence of hemorrhagic conversion in the parietal lobe.  Oncology consulted and initially discussed with patient's adenocarcinoma diagnosis with the patient's niece over the phone recommended chemotherapy versus hospice.Palliative care consulted and niece agrees with hospice care.  Unfortunately she is now working multiple jobs unable to accommodate taking patient home for home hospice.  Placement has been difficult given patient having Medicare and lack of available beds.  Subjective: Awakened Tells me he is hungry and would like to eat this morning (this is new for him)  Objective: Vitals:   06/22/20 2152 06/23/20 0937  BP: 125/67 125/86  Pulse: (!) 114 (!) 101  Resp:  18  Temp: 100 F (37.8 C) 98.9 F (37.2 C)  SpO2: 93% 97%    Intake/Output Summary (Last 24 hours) at 06/23/2020 1051 Last data filed at 06/23/2020 0513 Gross per 24 hour  Intake 794.81 ml  Output 60 ml  Net 734.81 ml   Filed Weights   05/31/20 0500 06/01/20 0254 06/11/20 0513  Weight: 63.5 kg 66.7 kg 80.7 kg    Exam: Constitutional: NAD, no acute distress, appears older than stated age ENMT: Mucous membranes are moist.Normal dentition.  Respiratory: clear to auscultation bilaterally, Normal respiratory effort.  Room air Cardiovascular: Regular rate and rhythm, no murmurs / rubs / gallops. No extremity edema. 2+ pedal pulses.  Normal saline at 50 cc/h Abdomen: no tenderness, no masses palpated.  Bowel sounds positive.  Poor oral intake. Musculoskeletal: no clubbing / cyanosis. No joint deformity upper and lower extremities. no contractures.  Skin: no rashes, lesions, ulcers. No induration Neurologic: CN  2-12 grossly intact. Sensation intact, DTR normal. Strength 4/5 x all 4 extremities.  Psychiatric: Alert, oriented to name only   Assessment/Plan: Acute hypoxic respiratory failure in the setting of massive PE/cardiac arrest Status post mechanical ventilation and tracheostomy.   Resolved Now decannulated  and stable on room air.  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 Currently not on any anticoagulation due to GI bleed and CVA w/ hemorrhagic conversion.  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   Continue Seroquel 100 mg am and 150 mg HS EKG from 7/6 with a corrected QTC of 450 ms based on RBB QRS= 122 ms and J-T= 328 ms.  Repeat EKG 7s/p/9 pending at time of dictation.  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 Due to location of gastric mass it is not unexpected that patient unable to eat/has significant anorexia early satiety-recommendation is for easy to digest foods with focus on high-protein beverages and shakes  Sepsis 2/2 Streptococcus bacteremia Resolved Completed 10 days of Unasyn.  Patient remains afebrile but has mild tachycardia and leukocytosis probably secondary to malignancy and ongoing malnutrition  Constipation As needed Senokot.  Acute hypokalemia Potassium stable on replacement of 20 mEq daily with most recent reading 3.5 Magnesium 1.9  Debility/physical deconditioning 7/5: PT: Continues to work on functional mobility and activity tolerance.  Continues with poor cognition, vision, balance, functional use of bilateral upper extremities, balance, strength and activity tolerance.  Requires max cues for participating in ADLs  and out of bed activity as well as moderate assist +2 for functional mobility.  He has poor safety awarenessDocumented decreased functional mobility with adjustment in PT goals..  **On 7/8 patient declined working with PT stating "stop trying to trick me into walking" when assistance offered to mobilize to bathroom.  7/8: OT: Able to stand with mod assist from therapist. He required max multimodal cues for completion of pericare. Completed sit<> stand x3 with mod assist to power up and for stability in standing.  Severe malnutrition probably secondary to adenocarcinoma, chronic illness multiple liver lesions, metastatic disease Nutrition Status: Nutrition Problem: Severe Malnutrition Etiology: chronic illness (gastric lesion, multiple liver lesions, concern for metastatic disease) Signs/Symptoms: severe fat depletion, severe muscle depletion Interventions: Ensure Enlive (each supplement provides 350kcal and 20 grams of protein), Magic cup Gastric mass extends from antrum into prepyloric area and into pylorus and probably in the Duodenal Bulb. I was not able to see the duodenal bulb clearly.  Unlikely patient will be able to eat appropriately again given degree of cancer invasion and likely would benefit more from comfort feeds Continue easy to digest food and high-protein beverages and shakes-patient with very minimal oral intake and only is remaining hydrated based on low flow IV fluids currently in place     Data Reviewed: Basic Metabolic Panel: Recent Labs  Lab 06/19/20 0030 06/23/20 0446  NA 137 135  K 3.5 3.5  CL 104 101  CO2 23 26  GLUCOSE 76 89  BUN <5* <5*  CREATININE 0.66 0.56*  CALCIUM 8.1* 8.2*  MG 1.7  --   PHOS  --  3.6   Liver Function Tests: Recent Labs  Lab 06/19/20 0030 06/23/20 0446  AST 14*  --   ALT 10  --   ALKPHOS 70  --   BILITOT 1.1  --   PROT 5.4*  --   ALBUMIN 1.8* 1.7*   No results for input(s): LIPASE, AMYLASE in the last 168 hours. No  results for input(s): AMMONIA in the last 168 hours. CBC: Recent Labs  Lab 06/19/20 0030  WBC 13.3*  HGB 7.5*  HCT 23.2*  MCV 82.3  PLT 724*   Cardiac Enzymes: No results for input(s): CKTOTAL, CKMB, CKMBINDEX, TROPONINI in the last 168 hours. BNP (last 3 results) Recent Labs    03/07/20 0710  BNP 74.6    ProBNP (last 3 results) No results for input(s): PROBNP in the last 8760 hours.  CBG: No results for input(s): GLUCAP in the last 168 hours.  No results found for this or any previous visit (from the past 240 hour(s)).   Studies: No results found.  Scheduled Meds: . clonazePAM  0.5 mg Oral BID  . feeding supplement (ENSURE ENLIVE)  237 mL Oral QID  . ferrous sulfate  325 mg Oral Q breakfast  . FLUoxetine  10 mg Oral QHS  . OXcarbazepine  75 mg Oral BID  . pantoprazole  40 mg Oral Daily  . polyethylene glycol  17 g Oral BID  . potassium chloride  20 mEq Oral Daily  . QUEtiapine  100 mg Oral Daily  . QUEtiapine  150 mg Oral QHS  . senna-docusate  1 tablet Oral BID   Continuous Infusions: . sodium chloride    . sodium chloride 50 mL/hr at 06/23/20 7062    Principal Problem:   Cardiac arrest [facility]) Active Problems:   Encounter for central line placement   GI bleed   Acute respiratory failure (HCC)   Anoxic encephalopathy ([place])   Cerebral embolism with cerebral infarction   Pulmonary emboli ([place])   Palliative care by specialist   Goals of care, counseling/discussion   DNR (do not resuscitate)   Status post tracheostomy ([facility])   Protein-calorie malnutrition, severe   Neuroendocrine cancer ([place])     Code Status: DNR Family Communication: [name] niece updated 7/2.  She is aware that primary reason for remaining in [place] is lack of bed availability.  She has been updated on patient's poor intake in relation to location of gastric mass.  She is aware he is not having any abdominal pain and is overall comfortable. **TOC attempted to speak with Ms.  [name] again on 7/8 update on lack of bed offers in referred area. Niece did not answer her phone and voicemail has not been set up. DVT prophylaxis: SCDs   Consultants:  Palliative medicine team  Oncology  PCCM  Procedures:  Echocardiogram  EEG  EGD  Cortrack feeding tube  Antibiotics: Anti-infectives (From admission, onward)   Start     Dose/Rate Route Frequency Ordered Stop   06/02/20 1630  Ampicillin-Sulbactam (UNASYN) 3 g in sodium chloride 0.9 % 100 mL IVPB        3 g 200 mL/hr over 30 Minutes Intravenous Every 6 hours 06/02/20 1626 06/10/20 2215   06/02/20 0645  cefTRIAXone (ROCEPHIN) 2 g in sodium chloride 0.9 % 100 mL IVPB  Status:  Discontinued        2 g 200  mL/hr over 30 Minutes Intravenous Daily 06/02/20 0622 06/02/20 1607   06/01/20 0715  cefTRIAXone (ROCEPHIN) 1 g in sodium chloride 0.9 % 100 mL IVPB  Status:  Discontinued        1 g 200 mL/hr over 30 Minutes Intravenous Every 24 hours 06/01/20 0710 06/02/20 0622   03/26/20 1100  doxycycline (VIBRAMYCIN) 100 mg in sodium chloride 0.9 % 250 mL IVPB  Status:  Discontinued        100 mg 125 mL/hr over 120 Minutes Intravenous 2 times daily 03/26/20 1002 04/01/20 1035   03/25/20 1100  doxycycline (VIBRA-TABS) tablet 100 mg  Status:  Discontinued        100 mg Per Tube Every 12 hours 03/25/20 1009 03/26/20 1002   03/25/20 1015  doxycycline (VIBRAMYCIN) 100 mg in sodium chloride 0.9 % 250 mL IVPB  Status:  Discontinued       Note to Pharmacy: To be given after the tracheal aspirate is collected.   100 mg 125 mL/hr over 120 Minutes Intravenous Every 12 hours 03/25/20 1005 03/25/20 1008       Time spent: 15 minutes    Erin Hearing ANP [facility] Pager [phone]. If 7PM-7AM, please contact night-coverage at [email or web address] 06/23/2020, 10:51 AM  LOS: 108 days

## 2020-06-24 NOTE — Progress Notes (Signed)
PROGRESS NOTE  Kenneth Sullivan WYO:[id number] DOB: 03/15/1956 DOA: 03/07/2020 PCP: Medicine, [name]  HPI/Recap of past 7 hours: 64 year old gentleman with no prior medical history initially presented to ED with shortness of breath and acute respiratory failure, had a witnessed bradycardic asystolic arrest. He underwent normothermia protocol and was found to have massive PE and DVT with right ventricular strain. Hospital course was initially complicated by upper GI bleed due to gastric ulcer. CT abdomen pelvis showed a large antral ulcer, mass with multiple liver lesions and biopsy confirmed high-grade neuroendocrine tumor. Hospital course was further complicated by multiple acute to subacute ischemic infarcts involving bilateral cerebral hemispheres consistent with global hypoperfusion and also associated with scattered petechial hemorrhages with evidence of hemorrhagic conversion in the parietal lobe. Oncology consulted, recommended chemotherapy versus hospice. Palliative care consulted and niece agrees with hospice care. Unfortunately she is now working multiple jobs unable to accommodate taking patient home for home hospice.  Placement has been difficult given patient having Medicare and lack of available beds.    Today, patient denies any new complaints.  Met in bed resting.    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 Oncology  consulted, not appropriate candidate for chemotherapy Palliative on board, recommend hospice Awaiting placement  Massive cardiac arrest/anoxic brain injury 2/2 massive PE with DVT s/p IR thrombectomy and IVC placement Currently not on any anticoagulation due to GI bleed and CVA with hemorrhagic conversion  Upper GI bleed 2/2 large malignant gastric ulcer Acute blood loss anemia Monitor CBC  Sepsis 2/2 Streptococcus bacteremia Resolved Currently afebrile Completed 10 days of Unasyn  Severe malnutrition/physical deconditioning Continue to encourage oral intake Continue supplements        Malnutrition Type:  Nutrition Problem: Severe Malnutrition Etiology: chronic illness (gastric lesion, multiple liver lesions, concern for metastatic disease)   Malnutrition Characteristics:  Signs/Symptoms: severe fat depletion, severe muscle depletion   Nutrition Interventions:  Interventions: Ensure Enlive (each supplement provides 350kcal and 20 grams of protein), Magic cup    Estimated body mass index is 22.85 kg/m as calculated from the following:   Height as of this encounter: _0  (1.88 m).   Weight as of this encounter: 80.7 kg.     Code Status: DNR  Family Communication: NP Ebony Hail spoke to niece on 7/2, attempted to call on 7/8 but no answer  Disposition Plan: Status is: Inpatient  Remains inpatient appropriate because:Inpatient level of care appropriate due to severity of illness   Dispo: The patient is from: Home              Anticipated d/c is to: SNF              Anticipated d/c date is: 3 days              Patient currently is medically stable to d/c.    Consultants:  Palliative medicine  Oncology  PCCM  Procedures:  EGD  Mechanical ventilation  Antimicrobials:  None  DVT prophylaxis: SCDs   Objective: Vitals:   06/23/20 1718 06/23/20 1949 06/24/20 0824 06/24/20 1639  BP: 124/88 128/87 116/80 (!) 137/96  Pulse: (!) 108 (!) 110  100 (!) 108  Resp: _0 Temp: 98.4 F (36.9 C) 97.9 F (36.6 C) 98.7 F (37.1 C) 98.7 F (37.1 C)  TempSrc: Oral     SpO2: 91% 100% 100% 100%  Weight:      Height:        Intake/Output Summary (Last 24 hours) at 06/24/2020 1720 Last data filed at 06/23/2020 1800 Gross per 24 hour  Intake 50 ml  Output 400 ml  Net -350 ml   Filed Weights   05/31/20 0500 06/01/20 0254 06/11/20 0513  Weight: 63.5 kg 66.7 kg 80.7 kg    Exam:  General: NAD, chronically ill-appearing, alert  Cardiovascular: S1, S2 present  Respiratory: CTAB  Abdomen: Soft, nontender, nondistended, bowel sounds present  Musculoskeletal: No bilateral pedal edema noted  Skin: Normal  Psychiatry: Normal mood    Data Reviewed: CBC: Recent Labs  Lab 06/19/20 0030  WBC 13.3*  HGB 7.5*  HCT 23.2*  MCV 82.3  PLT 281*   Basic Metabolic Panel: Recent Labs  Lab 06/19/20 0030 06/23/20 0446  NA 137 135  K 3.5 3.5  CL 104 101  CO2 23 26  GLUCOSE 76 89  BUN <5* <5*  CREATININE 0.66 0.56*  CALCIUM 8.1* 8.2*  MG 1.7  --   PHOS  --  3.6   GFR: Estimated Creatinine Clearance: 109.3 mL/min (A) (by C-G formula based on SCr of 0.56 mg/dL (L)). Liver Function Tests: Recent Labs  Lab 06/19/20 0030 06/23/20 0446  AST 14*  --   ALT 10  --   ALKPHOS 70  --   BILITOT 1.1  --   PROT 5.4*  --   ALBUMIN 1.8* 1.7*   No results for input(s): LIPASE, AMYLASE in the last 168 hours. No results for input(s): AMMONIA in the last 168 hours. Coagulation Profile: No results for input(s): INR, PROTIME in the last 168 hours. Cardiac Enzymes: No results for input(s): CKTOTAL, CKMB, CKMBINDEX, TROPONINI in the last 168 hours. BNP (last 3 results) No results for input(s): PROBNP in the last 8760 hours. HbA1C: No results for input(s): HGBA1C in the last 72 hours. CBG: No results for input(s): GLUCAP in the last 168 hours. Lipid Profile: No results for input(s): CHOL, HDL, LDLCALC, TRIG, CHOLHDL,  LDLDIRECT in the last 72 hours. Thyroid Function Tests: No results for input(s): TSH, T4TOTAL, FREET4, T3FREE, THYROIDAB in the last 72 hours. Anemia Panel: No results for input(s): VITAMINB12, FOLATE, FERRITIN, TIBC, IRON, RETICCTPCT in the last 72 hours. Urine analysis:    Component Value Date/Time   COLORURINE AMBER (A) 06/01/2020 0506   APPEARANCEUR HAZY (A) 06/01/2020 0506   LABSPEC 1.025 06/01/2020 0506   PHURINE 5.0 06/01/2020 0506   GLUCOSEU NEGATIVE 06/01/2020 0506   HGBUR NEGATIVE 06/01/2020 0506   BILIRUBINUR NEGATIVE 06/01/2020 0506   KETONESUR NEGATIVE 06/01/2020 0506   PROTEINUR NEGATIVE 06/01/2020 0506   NITRITE NEGATIVE 06/01/2020 0506   LEUKOCYTESUR LARGE (A) 06/01/2020 0506   Sepsis Labs: _1 (procalcitonin:4,lacticidven:4)  )No results found for this or any previous visit (from the past 240 hour(s)).    Studies: No results found.  Scheduled Meds: . clonazePAM  0.5 mg Oral BID  . feeding supplement (ENSURE ENLIVE)  237 mL Oral QID  . ferrous sulfate  325 mg Oral Q  breakfast  . FLUoxetine  10 mg Oral QHS  . OXcarbazepine  75 mg Oral BID  . pantoprazole  40 mg Oral Daily  . polyethylene glycol  17 g Oral BID  . potassium chloride  20 mEq Oral Daily  . QUEtiapine  100 mg Oral Daily  . QUEtiapine  150 mg Oral QHS  . senna-docusate  1 tablet Oral BID    Continuous Infusions: . sodium chloride    . sodium chloride 50 mL/hr at 06/24/20 1608     LOS: 109 days     Alma Friendly, MD [facility]  If 7PM-7AM, please contact night-coverage [email or web address] 06/24/2020, 5:20 PM

## 2020-06-25 NOTE — Progress Notes (Signed)
PROGRESS NOTE  Kenneth Sullivan VVO:[id number] DOB: June 14, 1956 DOA: 03/07/2020 PCP: Medicine, [name]  HPI/Recap of past 37 hours: 64 year old gentleman with no prior medical history initially presented to ED with shortness of breath and acute respiratory failure, had a witnessed bradycardic asystolic arrest. He underwent normothermia protocol and was found to have massive PE and DVT with right ventricular strain. Hospital course was initially complicated by upper GI bleed due to gastric ulcer. CT abdomen pelvis showed a large antral ulcer, mass with multiple liver lesions and biopsy confirmed high-grade neuroendocrine tumor. Hospital course was further complicated by multiple acute to subacute ischemic infarcts involving bilateral cerebral hemispheres consistent with global hypoperfusion and also associated with scattered petechial hemorrhages with evidence of hemorrhagic conversion in the parietal lobe. Oncology consulted, recommended chemotherapy versus hospice. Palliative care consulted and niece agrees with hospice care. Unfortunately she is now working multiple jobs unable to accommodate taking patient home for home hospice.  Placement has been difficult given patient having Medicare and lack of available beds.    Today, patient denies any new complaints.    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 Oncology consulted, not  appropriate candidate for chemotherapy Palliative on board, recommend hospice Awaiting placement  Massive cardiac arrest/anoxic brain injury 2/2 massive PE with DVT s/p IR thrombectomy and IVC placement Currently not on any anticoagulation due to GI bleed and CVA with hemorrhagic conversion  Upper GI bleed 2/2 large malignant gastric ulcer Acute blood loss anemia Monitor CBC  Sepsis 2/2 Streptococcus bacteremia Resolved Currently afebrile Completed 10 days of Unasyn  Severe malnutrition/physical deconditioning Continue to encourage oral intake Continue supplements        Malnutrition Type:  Nutrition Problem: Severe Malnutrition Etiology: chronic illness (gastric lesion, multiple liver lesions, concern for metastatic disease)   Malnutrition Characteristics:  Signs/Symptoms: severe fat depletion, severe muscle depletion   Nutrition Interventions:  Interventions: Ensure Enlive (each supplement provides 350kcal and 20 grams of protein), Magic cup    Estimated body mass index is 22.85 kg/m as calculated from the following:   Height as of this encounter: 6\' 2"  (1.88 m).   Weight as of this encounter: 80.7 kg.     Code Status: DNR  Family Communication: NP Ebony Hail spoke to niece on 7/2, attempted to call on 7/8 but no answer  Disposition Plan: Status is: Inpatient  Remains inpatient appropriate because:Inpatient level of care appropriate due to severity of illness   Dispo: The patient is from: Home              Anticipated d/c is to: SNF              Anticipated d/c date is: 3 days              Patient currently is medically stable to d/c.    Consultants:  Palliative medicine  Oncology  PCCM  Procedures:  EGD  Mechanical ventilation    Antimicrobials:  None  DVT prophylaxis: SCDs   Objective: Vitals:   06/24/20 0824 06/24/20 1639 06/24/20 2100 06/25/20 0827  BP: 116/80 (!) 137/96 128/88 122/82  Pulse: 100 (!) 108 100 (!) 103  Resp:  18 19 18 19   Temp: 98.7 F (37.1 C) 98.7 F (37.1 C) 98.6 F (37 C) 99 F (37.2 C)  TempSrc:   Oral   SpO2: 100% 100% 100% 95%  Weight:      Height:        Intake/Output Summary (Last 24 hours) at 06/25/2020 1718 Last data filed at 06/25/2020 1401 Gross per 24 hour  Intake --  Output 1300 ml  Net -1300 ml   Filed Weights   05/31/20 0500 06/01/20 0254 06/11/20 0513  Weight: 63.5 kg 66.7 kg 80.7 kg    Exam:  General: NAD, chronically ill-appearing, alert  Cardiovascular: S1, S2 present  Respiratory: CTAB  Abdomen: Soft, nontender, nondistended, bowel sounds present  Musculoskeletal: No bilateral pedal edema noted  Skin: Normal  Psychiatry: Normal mood    Data Reviewed: CBC: Recent Labs  Lab 06/19/20 0030  WBC 13.3*  HGB 7.5*  HCT 23.2*  MCV 82.3  PLT 789*   Basic Metabolic Panel: Recent Labs  Lab 06/19/20 0030 06/23/20 0446  NA 137 135  K 3.5 3.5  CL 104 101  CO2 23 26  GLUCOSE 76 89  BUN <5* <5*  CREATININE 0.66 0.56*  CALCIUM 8.1* 8.2*  MG 1.7  --   PHOS  --  3.6   GFR: Estimated Creatinine Clearance: 109.3 mL/min (A) (by C-G formula based on SCr of 0.56 mg/dL (L)). Liver Function Tests: Recent Labs  Lab 06/19/20 0030 06/23/20 0446  AST 14*  --   ALT 10  --   ALKPHOS 70  --   BILITOT 1.1  --   PROT 5.4*  --   ALBUMIN 1.8* 1.7*   No results for input(s): LIPASE, AMYLASE in the last 168 hours. No results for input(s): AMMONIA in the last 168 hours. Coagulation Profile: No results for input(s): INR, PROTIME in the last 168 hours. Cardiac Enzymes: No results for input(s): CKTOTAL, CKMB, CKMBINDEX, TROPONINI in the last 168 hours. BNP (last 3 results) No results for input(s): PROBNP in the last 8760 hours. HbA1C: No results for input(s): HGBA1C in the last 72 hours. CBG: No results for input(s): GLUCAP in the last 168 hours. Lipid Profile: No results for input(s): CHOL, HDL, LDLCALC, TRIG, CHOLHDL, LDLDIRECT in the last 72  hours. Thyroid Function Tests: No results for input(s): TSH, T4TOTAL, FREET4, T3FREE, THYROIDAB in the last 72 hours. Anemia Panel: No results for input(s): VITAMINB12, FOLATE, FERRITIN, TIBC, IRON, RETICCTPCT in the last 72 hours. Urine analysis:    Component Value Date/Time   COLORURINE AMBER (A) 06/01/2020 0506   APPEARANCEUR HAZY (A) 06/01/2020 0506   LABSPEC 1.025 06/01/2020 0506   PHURINE 5.0 06/01/2020 0506   GLUCOSEU NEGATIVE 06/01/2020 0506   HGBUR NEGATIVE 06/01/2020 0506   BILIRUBINUR NEGATIVE 06/01/2020 0506   KETONESUR NEGATIVE 06/01/2020 0506   PROTEINUR NEGATIVE 06/01/2020 0506   NITRITE NEGATIVE 06/01/2020 0506   LEUKOCYTESUR LARGE (A) 06/01/2020 0506   Sepsis Labs: @LABRCNTIP (procalcitonin:4,lacticidven:4)  )No results found for this or any previous visit (from the past 240 hour(s)).    Studies: No results found.  Scheduled Meds: . clonazePAM  0.5 mg Oral BID  . feeding supplement (ENSURE ENLIVE)  237 mL Oral QID  . ferrous sulfate  325 mg Oral Q breakfast  . FLUoxetine  10  mg Oral QHS  . OXcarbazepine  75 mg Oral BID  . pantoprazole  40 mg Oral Daily  . polyethylene glycol  17 g Oral BID  . potassium chloride  20 mEq Oral Daily  . QUEtiapine  100 mg Oral Daily  . QUEtiapine  150 mg Oral QHS  . senna-docusate  1 tablet Oral BID    Continuous Infusions: . sodium chloride    . sodium chloride 50 mL/hr at 06/25/20 1240     LOS: 110 days     Alma Friendly, MD [facility]  If 7PM-7AM, please contact night-coverage [email or web address] 06/25/2020, 5:18 PM

## 2020-06-26 LAB — COMPREHENSIVE METABOLIC PANEL
ALT: 9 U/L (ref 0–44)
AST: 17 U/L (ref 15–41)
Albumin: 1.7 g/dL — ABNORMAL LOW (ref 3.5–5.0)
Alkaline Phosphatase: 74 U/L (ref 38–126)
Anion gap: 7 (ref 5–15)
BUN: <5 mg/dL — ABNORMAL LOW (ref 8–23)
CO2: 25 mmol/L (ref 22–32)
Calcium: 8.3 mg/dL — ABNORMAL LOW (ref 8.9–10.3)
Chloride: 102 mmol/L (ref 98–111)
Creatinine, Ser: 0.55 mg/dL — ABNORMAL LOW (ref 0.61–1.24)
GFR calc Af Amer: >60 mL/min (ref >60)
GFR calc non Af Amer: >60 mL/min (ref >60)
Glucose, Bld: 95 mg/dL (ref 70–99)
Potassium: 3.9 mmol/L (ref 3.5–5.1)
Sodium: 134 mmol/L — ABNORMAL LOW (ref 135–145)
Total Bilirubin: 0.6 mg/dL (ref 0.3–1.2)
Total Protein: 5.7 g/dL — ABNORMAL LOW (ref 6.5–8.1)

## 2020-06-26 LAB — MAGNESIUM: Magnesium: 1.7 mg/dL (ref 1.7–2.4)

## 2020-06-26 NOTE — Progress Notes (Signed)
Patient refused all evening medications. Quiet and calm demeanor. Solmnolence noted, awakens easily to voice. Dr. Myna Hidalgo notified of patient condition. No new orders at this time, will continue to monitor closely.

## 2020-06-26 NOTE — Progress Notes (Signed)
Physical Therapy Treatment Patient Details Name: Kenneth Sullivan MRN: [id number] DOB: October 09, 1956 Today's Date: 06/26/2020    History of Present Illness 64 y.o. male admitted 03/07/20 with acute SOB; had bradycardic event with asystole/cardiac arrest, ROSC achieved in 20 minutes with 3 rounds epinephrine; intubated 3/23. Pt with massive PE/DVT and RV strain. S/p IR guided mechanical thrombectomy and IVC filter placement. Devoloped gastric ulcer s/p GDA embolization. MRI 3/27 revealed multifocal acute to subacute ischemic infarct involving bilateral cerebral hemisphere consistent with global hypoperfusion; associated scattered petechial hemorrhages with evidence of hemmorhagic conversion in R parietal lobe. S/p trach 4/5, changed to cuffless 4/22; decannulated 4/28. Liver biopsy 5/19. 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 On 06/01/20 - pt with UTI.  On 06/08/20 - pt continues to have a fever, mention of possible hospice in notes if does not improve. PMH unknown.    PT Comments    Patient received in bed, very confused today and requiring max encouragement and coaxing but ultimately able to convince him to participate in therapy today. See below for mobility/assist levels. Very confused and often stated "you go ahead and do that...Marland KitchenMarland KitchenMarland Kitchenwhy are you looking at me, I don't know what I'm supposed to do" and talking about [place] leaving to go watch a movie being very important because its only on today and not tomorrow. Eventually able to get him to EOB and then eventually convinced him to get to chair with considerable time and coaxing. Left sitting up in chair with all needs met, NT aware of patient being up and chair alarm active. Definitely continue to recommend SNF.    Follow Up Recommendations  Supervision/Assistance - 24 hour;SNF     Equipment Recommendations  Wheelchair cushion (measurements PT);Wheelchair (measurements PT);3in1 (PT)     Recommendations for Other Services       Precautions / Restrictions Precautions Precautions: Fall Precaution Comments: impulsive Restrictions Weight Bearing Restrictions: No    Mobility  Bed Mobility Overal bed mobility: Needs Assistance Bed Mobility: Supine to Sit     Supine to sit: Mod assist;+2 for physical assistance;HOB elevated     General bed mobility comments: ModAx2 for BLEs and trunk elevation, HOB also elevated to get to EOB and strong posterior lean which faded from [place] to min guard  Transfers Overall transfer level: Needs assistance Equipment used: Rolling walker (2 wheeled) Transfers: Sit to/from [facility] Sit to Stand: Mod assist;+2 physical assistance Stand pivot transfers: Min assist;+2 physical assistance       General transfer comment: ModAx2 to power up to standing, poor initiation and effort; once on feet able to pivot to chair with MinA and max encouragement/coaxing  Ambulation/Gait             General Gait Details: refused   Stairs             Wheelchair Mobility    Modified Rankin (Stroke Patients Only)       Balance Overall balance assessment: Needs assistance Sitting-balance support: No upper extremity supported;Feet supported Sitting balance-Leahy Scale: Fair Sitting balance - Comments: min guard to close S assist   Standing balance support: Bilateral upper extremity supported;During functional activity Standing balance-Leahy Scale: Poor Standing balance comment: reliant on external support                            Cognition Arousal/Alertness: Awake/alert Behavior During Therapy: Flat affect;Impulsive Overall Cognitive Status: Impaired/Different from  baseline Area of Impairment: Attention;Following commands;Safety/judgement;Awareness;Problem solving;Memory;Orientation                 Orientation Level: Situation;Disoriented to;Place;Time Current Attention Level:  Sustained Memory: Decreased short-term memory;Decreased recall of precautions Following Commands: Follows one step commands inconsistently   Awareness: Intellectual Problem Solving: Requires tactile cues;Requires verbal cues;Slow processing;Decreased initiation;Difficulty sequencing General Comments: very confused and poor ability to follow commands today- frequently stated "yes you go ahead and do that, I'll sit and watch...Marland KitchenMarland KitchenMarland Kitchenwhy are you looking at me, what do you want?" when asked to do simple tasks. Needed max coaxing and encouragement.      Exercises      General Comments        Pertinent Vitals/Pain Pain Assessment: Faces Faces Pain Scale: Hurts a little bit Pain Location: generalized with movement Pain Descriptors / Indicators: Discomfort Pain Intervention(s): Limited activity within patient's tolerance;Monitored during session    Home Living                      Prior Function            PT Goals (current goals can now be found in the care plan section) Acute Rehab PT Goals Patient Stated Goal: pt did not state PT Goal Formulation: With patient Time For Goal Achievement: 06/16/20 Potential to Achieve Goals: Poor Progress towards PT goals: Progressing toward goals (very slow, progress waxes and wanes)    Frequency    Min 2X/week      PT Plan Current plan remains appropriate    Co-evaluation              AM-PAC PT "6 Clicks" Mobility   Outcome Measure  Help needed turning from your back to your side while in a flat bed without using bedrails?: A Little Help needed moving from lying on your back to sitting on the side of a flat bed without using bedrails?: A Lot Help needed moving to and from a bed to a chair (including a wheelchair)?: A Lot Help needed standing up from a chair using your arms (e.g., wheelchair or bedside chair)?: A Lot Help needed to walk in hospital room?: A Lot Help needed climbing 3-5 steps with a railing? : Total 6  Click Score: 12    End of Session Equipment Utilized During Treatment: Gait belt Activity Tolerance: Patient limited by fatigue;Other (comment) (confusion) Patient left: in chair;with call bell/phone within reach;with chair alarm set Nurse Communication: Mobility status PT Visit Diagnosis: Unsteadiness on feet (R26.81);Other abnormalities of gait and mobility (R26.89)     Time: 1335-1405 PT Time Calculation (min) (ACUTE ONLY): 30 min  Charges:  $Therapeutic Activity: 23-37 mins                     Windell Norfolk, DPT, PN1   Supplemental Physical Therapist [place]    Pager [phone] Acute Rehab Office [phone]

## 2020-06-26 NOTE — Progress Notes (Signed)
[facility] PROGRESS NOTE  Anirudh Baiz SJG:[id number] DOB: [birth year] DOA: 03/07/2020 PCP: [facility], [name]  Status: Inpatient--Remains inpatient appropriate because:Altered mental status, Unsafe d/c plan and Inpatient level of care appropriate due to severity of illness   Dispo: The patient is from: Home              Anticipated d/c is to: SNF with hospice              Anticipated d/c date is: > 3 days              Patient currently is medically stable to d/c.  **No beds available at [facility] area SNF facilities **Home hospice considered but niece (who would be primary caretaker) is unable to take the patient home as she is working multiple jobs.  She agrees with hospice care but placement and location of care remain difficult since patient has Medicaid.  HPI: 64 year old gentleman with no prior medical history initially presented to ED with shortness of breath and acute respiratory failure, had a witnessed bradycardic asystolic arrest.  He underwent normothermia protocol and was found to have massive PE and DVT with right ventricular strain.  Hospital course was initially complicated by upper GI bleed due to gastric ulcer.  CT abdomen pelvis showed a large antral ulcer, mass with multiple liver lesions and biopsy confirmed high-grade neuroendocrine tumor.  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  Patient underwent IR guided mechanical thrombectomy for his massive PE and IVC filter.  Postprocedure while on heparin drip patient started having maroon-colored red stool and bloody drainage  from the OGT.  GI consulted and he underwent endoscopy showing gastric ulcer with blood clots.  IR consulted and he underwent GDA embolization.  Hospital course was further complicated by multiple acute to subacute ischemic infarcts involving bilateral cerebral hemispheres consistent with global hypoperfusion and also associated with scattered petechial hemorrhages with  evidence of hemorrhagic conversion in the parietal lobe.  Oncology consulted and initially discussed with patient's adenocarcinoma diagnosis with the patient's niece over the phone recommended chemotherapy versus hospice.Palliative care consulted and niece agrees with hospice care.  Unfortunately she is now working multiple jobs unable to accommodate taking patient home for home hospice.  Placement has been difficult given patient having Medicare and lack of available beds.  Subjective: Alert and engaging in conversation at times directed by the patient. Tells me he ate better over the weekend.  Objective: Vitals:   06/25/20 2052 06/26/20 0827  BP: 122/78 118/84  Pulse: (!) 101 97  Resp: 18 20  Temp: 97.8 F (36.6 C) 97.8 F (36.6 C)  SpO2: 98% 100%    Intake/Output Summary (Last 24 hours) at 06/26/2020 1043 Last data filed at 06/26/2020 0610 Gross per 24 hour  Intake --  Output 2000 ml  Net -2000 ml   Filed Weights   05/31/20 0500 06/01/20 0254 06/11/20 0513  Weight: 63.5 kg 66.7 kg 80.7 kg    Exam: Constitutional: NAD, no acute distress, appears older than stated age ENMT: Mucous membranes are moist.Normal dentition.  Respiratory: clear to auscultation bilaterally, Normal respiratory effort.  Room air Cardiovascular: Regular rate and rhythm, no murmurs / rubs / gallops. No extremity edema. 2+ pedal pulses.  Normal saline at 50 cc/h Abdomen: no tenderness, no masses palpated.  Bowel sounds positive.  Poor oral intake. Musculoskeletal: no clubbing / cyanosis. No joint deformity upper and lower extremities. no contractures.  Skin: no rashes, lesions, ulcers. No induration Neurologic: CN 2-12  grossly intact. Sensation intact, DTR normal. Strength 4/5 x all 4 extremities.  Psychiatric: Alert, oriented to name only   Assessment/Plan: Acute hypoxic respiratory failure in the setting of massive PE/cardiac arrest Status post mechanical ventilation and tracheostomy.    Resolved Now decannulated and stable on room air.  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 Currently not on any anticoagulation due to GI bleed and CVA w/ hemorrhagic conversion.  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 Continue Prozac and Klonopin.   Continue Seroquel 100 mg am and 150 mg HS EKG from 7/6 with a corrected QTC of 450 ms based on RBB QRS= 122 ms and J-T= 328 ms.  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 Due to location of gastric mass it is not unexpected that patient unable to eat/has significant anorexia early satiety-recommendation is for easy to digest foods with focus on high-protein beverages and shakes  Sepsis 2/2 Streptococcus bacteremia Resolved Completed 10 days of Unasyn.  Patient remains afebrile but has mild tachycardia and leukocytosis probably secondary to malignancy and ongoing malnutrition  Constipation As needed Senokot.  Acute hypokalemia Potassium stable on replacement of 20 mEq daily with most recent reading 3.5 Magnesium 1.9  Debility/physical deconditioning 7/5: PT: Continues to work on functional mobility and activity tolerance.  Continues with poor cognition, vision, balance, functional use of bilateral upper extremities, balance,  strength and activity tolerance.  Requires max cues for participating in ADLs and out of bed activity as well as moderate assist +2 for functional mobility.  He has poor safety awarenessDocumented decreased functional mobility with adjustment in PT goals..  **On 7/8 patient declined working with PT stating "stop trying to trick me into walking" when assistance offered to mobilize to bathroom.  7/8: OT: Able to stand with mod assist from therapist. He required max multimodal cues for completion of pericare. Completed sit<> stand x3 with mod assist to power up and for stability in standing. Continues to participate inconsistently with PT and OT  Severe malnutrition probably secondary to adenocarcinoma, chronic illness multiple liver lesions, metastatic disease Nutrition Status: Nutrition Problem: Severe Malnutrition Etiology: chronic illness (gastric lesion, multiple liver lesions, concern for metastatic disease) Signs/Symptoms: severe fat depletion, severe muscle depletion Interventions: Ensure Enlive (each supplement provides 350kcal and 20 grams of protein), Magic cup Gastric mass extends from antrum into prepyloric area and into pylorus and probably in the Duodenal Bulb. I was not able to see the duodenal bulb clearly.  Unlikely patient will be able to eat appropriately again given degree of cancer invasion and likely would benefit more from comfort feeds Continue easy to digest food and high-protein beverages and shakes-patient with very minimal oral intake and only is remaining hydrated based on low flow IV fluids currently in place     Data Reviewed: Basic Metabolic Panel: Recent Labs  Lab 06/23/20 0446 06/25/20 2334  NA 135 134*  K 3.5 3.9  CL 101 102  CO2 26 25  GLUCOSE 89 95  BUN <5* <5*  CREATININE 0.56* 0.55*  CALCIUM 8.2* 8.3*  MG  --  1.7  PHOS 3.6  --    Liver Function Tests: Recent Labs  Lab 06/23/20 0446 06/25/20 2334  AST  --  17  ALT  --  9  ALKPHOS  --  74   BILITOT  --  0.6  PROT  --  5.7*  ALBUMIN 1.7* 1.7*   No results for input(s): LIPASE, AMYLASE in the last 168 hours. No results for input(s): AMMONIA in the last 168 hours. CBC: No results for input(s): WBC, NEUTROABS, HGB, HCT, MCV, PLT in the last 168 hours. Cardiac Enzymes: No results for input(s): CKTOTAL, CKMB, CKMBINDEX, TROPONINI in the last 168 hours. BNP (last 3 results) Recent Labs    03/07/20 0710  BNP 74.6    ProBNP (last 3 results) No results for input(s): PROBNP in the last 8760 hours.  CBG: No results for input(s): GLUCAP in the last 168 hours.  No results found for this or any previous visit (from the past 240 hour(s)).   Studies: No results found.  Scheduled Meds: . clonazePAM  0.5 mg Oral BID  . feeding supplement (ENSURE ENLIVE)  237 mL Oral QID  . ferrous sulfate  325 mg Oral Q breakfast  . FLUoxetine  10 mg Oral QHS  . OXcarbazepine  75 mg Oral BID  . pantoprazole  40 mg Oral Daily  . polyethylene glycol  17 g Oral BID  . potassium chloride  20 mEq Oral Daily  . QUEtiapine  100 mg Oral Daily  . QUEtiapine  150 mg Oral QHS  . senna-docusate  1 tablet Oral BID   Continuous Infusions: . sodium chloride    . sodium chloride 50 mL/hr at 06/26/20 0610    Principal Problem:   Cardiac arrest [facility]) Active Problems:   Encounter for central line placement   GI bleed   Acute respiratory failure (HCC)   Anoxic encephalopathy ([place])   Cerebral embolism with cerebral infarction   Pulmonary emboli ([place])   Palliative care by specialist   Goals of care, counseling/discussion   DNR (do not resuscitate)   Status post tracheostomy ([place])   Protein-calorie malnutrition, severe   Neuroendocrine cancer ([place])     Code Status: DNR Family Communication: [name] niece updated 7/2.  She is aware that primary reason for remaining in [place] is lack of bed availability.  She has been updated on patient's poor intake in relation to location of gastric  mass.  She is aware he is not having any abdominal pain and is overall comfortable. **TOC attempted to speak with Ms. [name] again on 7/8 update on lack of bed offers in referred area. Niece did not answer her phone and voicemail has not been set up. DVT prophylaxis: SCDs   Consultants:  Palliative medicine team  Oncology  PCCM  Procedures:  Echocardiogram  EEG  EGD  Cortrack feeding tube  Antibiotics: Anti-infectives (From admission, onward)   Start     Dose/Rate Route Frequency Ordered Stop   06/02/20 1630  Ampicillin-Sulbactam (UNASYN) 3 g in sodium chloride 0.9 % 100 mL IVPB        3 g 200 mL/hr over 30 Minutes Intravenous Every 6 hours 06/02/20 1626 06/10/20 2215   06/02/20 0645  cefTRIAXone (ROCEPHIN) 2 g in sodium chloride 0.9 % 100 mL IVPB  Status:  Discontinued  2 g 200 mL/hr over 30 Minutes Intravenous Daily 06/02/20 0622 06/02/20 1607   06/01/20 0715  cefTRIAXone (ROCEPHIN) 1 g in sodium chloride 0.9 % 100 mL IVPB  Status:  Discontinued        1 g 200 mL/hr over 30 Minutes Intravenous Every 24 hours 06/01/20 0710 06/02/20 0622   03/26/20 1100  doxycycline (VIBRAMYCIN) 100 mg in sodium chloride 0.9 % 250 mL IVPB  Status:  Discontinued        100 mg 125 mL/hr over 120 Minutes Intravenous 2 times daily 03/26/20 1002 04/01/20 1035   03/25/20 1100  doxycycline (VIBRA-TABS) tablet 100 mg  Status:  Discontinued        100 mg Per Tube Every 12 hours 03/25/20 1009 03/26/20 1002   03/25/20 1015  doxycycline (VIBRAMYCIN) 100 mg in sodium chloride 0.9 % 250 mL IVPB  Status:  Discontinued       Note to Pharmacy: To be given after the tracheal aspirate is collected.   100 mg 125 mL/hr over 120 Minutes Intravenous Every 12 hours 03/25/20 1005 03/25/20 1008       Time spent: 15 minutes    Erin Hearing ANP [facility] Pager [phone]. If 7PM-7AM, please contact night-coverage at [email or web address] 06/26/2020, 10:43 AM  LOS: 111 days

## 2020-06-27 MED ORDER — MAGNESIUM SULFATE 2 GM/50ML IV SOLN
2.0000 g | Freq: Once | INTRAVENOUS | Status: AC
  Administered 2020-06-27: 2 g via INTRAVENOUS
  Filled 2020-06-27: qty 50

## 2020-06-27 MED ORDER — MAGNESIUM OXIDE 400 (241.3 MG) MG PO TABS
400.0000 mg | ORAL_TABLET | Freq: Two times a day (BID) | ORAL | Status: AC
  Administered 2020-06-27: 400 mg via ORAL
  Filled 2020-06-27 (×2): qty 1

## 2020-06-27 NOTE — TOC Progression Note (Addendum)
Transition of Care [facility]) - Progression Note    Patient Details  Name: Kenneth Sullivan MRN: [id number] Date of Birth: 06/05/1956  Transition of Care [facility]) CM/SW [place], RN Phone Number: [phone]  06/27/2020, 3:53 PM  Clinical Narrative:    Message left for Hinton Dyer at  [facility] in [place] [phone]) in reference to bed offer via Hub. Will await return call.   7/14 @ 1650 Spoke with Hinton Dyer who confirms that the facility may be able to offer patient a bed. Facility will reach out to niece to see if she is agreeable to signing over his check. If niece is agreeable the facility can proceed with placement. Hinton Dyer will call CM back with update after speaking with niece [name].    Expected Discharge Plan: Skilled Nursing Facility Barriers to Discharge: No SNF bed  Expected Discharge Plan and Services Expected Discharge Plan: [place] In-house Referral: Clinical Social Work     Living arrangements for the past 2 months: Apartment                                       Social Determinants of Health (SDOH) Interventions    Readmission Risk Interventions No flowsheet data found.

## 2020-06-27 NOTE — TOC CAGE-AID Note (Signed)
Transition of Care [facility]) - CAGE-AID Screening   Patient Details  Name: Kenneth Sullivan MRN: [id number] Date of Birth: Apr 09, 1956  Transition of Care [facility]) CM/SW Contact:    Emeterio Reeve, [place] Phone Number: 06/27/2020, 1:30 PM   Clinical Narrative:  Pt is unable to participate in assessment due to only being oriented to person per nurse flowsheet.   CAGE-AID Screening: Substance Abuse Screening unable to be completed due to: : Patient unable to participate      [place] Clinical Social Worker [phone]

## 2020-06-27 NOTE — TOC Progression Note (Signed)
Transition of Care [facility]) - Progression Note    Patient Details  Name: Kenneth Sullivan MRN: [id number] Date of Birth: [birth year]  Transition of Care [facility]) CM/SW [place], RN Phone Number: [phone]  06/27/2020, 3:37 PM  Clinical Narrative:    Called niece [name] to discuss discharge plan. [name] made aware that facility search near [facility] has no bed offers. Niece is ok with CM searching the [place] area again. Niece states that she wants everything done for her uncle to ensure that he is in a good facility. CM will continue bed search.     Expected Discharge Plan: Skilled Nursing Facility Barriers to Discharge: No SNF bed  Expected Discharge Plan and Services Expected Discharge Plan: [place] In-house Referral: Clinical Social Work     Living arrangements for the past 2 months: Apartment                                       Social Determinants of Health (SDOH) Interventions    Readmission Risk Interventions No flowsheet data found.

## 2020-06-27 NOTE — Progress Notes (Signed)
[facility] PROGRESS NOTE  Adyen Bifulco OHY:[id number] DOB: 11-18-1956 DOA: 03/07/2020 PCP: [name]  Status: Inpatient--Remains inpatient appropriate because:Altered mental status, Unsafe d/c plan and Inpatient level of care appropriate due to severity of illness   Dispo: The patient is from: Home              Anticipated d/c is to: SNF with hospice              Anticipated d/c date is: > 3 days              Patient currently is medically stable to d/c.   **No beds available at [facility] area SNF facilities **Home hospice considered but niece (who would be primary caretaker) is unable to take the patient home as she is working multiple jobs.  She agrees with hospice care but placement and location of care remain difficult since patient has Medicaid.  HPI: 64 year old gentleman with no prior medical history initially presented to ED with shortness of breath and acute respiratory failure, had a witnessed bradycardic asystolic arrest.  He underwent normothermia protocol and was found to have massive PE and DVT with right ventricular strain.  Hospital course was initially complicated by upper GI bleed due to gastric ulcer.  CT abdomen pelvis showed a large antral ulcer, mass with multiple liver lesions and biopsy confirmed high-grade neuroendocrine tumor.  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  Patient underwent IR guided mechanical thrombectomy for his massive PE and IVC filter.  Postprocedure while on heparin drip patient started having maroon-colored red stool and bloody drainage  from the OGT.  GI consulted and he underwent endoscopy showing gastric ulcer with blood clots.  IR consulted and he underwent GDA embolization.  Hospital course was further complicated by multiple acute to subacute ischemic infarcts involving bilateral cerebral hemispheres consistent with global hypoperfusion and also associated with scattered petechial hemorrhages  with evidence of hemorrhagic conversion in the parietal lobe.  Oncology consulted and initially discussed with patient's adenocarcinoma diagnosis with the patient's niece over the phone recommended chemotherapy versus hospice.Palliative care consulted and niece agrees with hospice care.  Unfortunately she is now working multiple jobs unable to accommodate taking patient home for home hospice.  Placement has been difficult given patient having Medicare and lack of available beds.  Subjective: Alert.  Continues to lack insight into current medical status and also demonstrating short-term memory issues. Attempted to reorient patient as to why he presented to the hospital but it is doubtful he understood what I was saying  Objective: Vitals:   06/26/20 1704 06/27/20 0734  BP: 133/83 135/85  Pulse: (!) 101 92  Resp: 20   Temp: 98 F (36.7 C) 98.5 F (36.9 C)  SpO2: 98% 96%    Intake/Output Summary (Last 24 hours) at 06/27/2020 1104 Last data filed at 06/26/2020 2300 Gross per 24 hour  Intake 350.07 ml  Output 500 ml  Net -149.93 ml   Filed Weights   05/31/20 0500 06/01/20 0254 06/11/20 0513  Weight: 63.5 kg 66.7 kg 80.7 kg    Exam: Constitutional: NAD, no acute distress, appears older than stated age ENMT: Mucous membranes are moist.Normal dentition.  Respiratory: clear to auscultation bilaterally, Normal respiratory effort.  Room air Cardiovascular: Regular rate and rhythm, no murmurs / rubs / gallops. No extremity edema. 2+ pedal pulses.  Normal saline at 50 cc/h Abdomen: no tenderness, no masses palpated.  Bowel sounds positive.  Poor oral intake. Musculoskeletal: no clubbing /  cyanosis. No joint deformity upper and lower extremities. no contractures.  Skin: no rashes, lesions, ulcers. No induration Neurologic: CN 2-12 grossly intact. Sensation intact, DTR normal. Strength 4/5 x all 4 extremities.  Psychiatric: Alert, oriented to name only-does not but continues flat  affect   Assessment/Plan: Acute hypoxic respiratory failure in the setting of massive PE/cardiac arrest Status post mechanical ventilation and tracheostomy.   Resolved Now decannulated and stable on room air.  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 Currently not on any anticoagulation due to GI bleed and CVA w/ hemorrhagic conversion.  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 Continue Prozac, Klonopin and Seroquel 100 mg am and 150 mg HS EKG from 7/6 with a corrected QTC of 450 ms based on RBB QRS= 122 ms and J-T= 328 ms.  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 Due to location of gastric mass it is not unexpected that patient unable to eat/has significant anorexia early satiety-recommendation is for easy to digest foods with focus on high-protein beverages and shakes  Sepsis 2/2 Streptococcus bacteremia Resolved Completed 10 days of Unasyn.  Patient remains afebrile but has mild tachycardia and leukocytosis probably secondary to malignancy and ongoing malnutrition  Constipation As needed Senokot.  Acute hypokalemia Potassium stable on replacement of 20 mEq daily with most recent reading 3.5 Magnesium 1.9  Debility/physical deconditioning 7/12: PT-session affected  by patient's ongoing confusion.  He required max encouragement and coaxing but was able to participate.  Bed mobility needs assistance from supine to sit with moderate assistance +2 also +2 assist for transferring from bed to standing to use rolling walker, patient noted with poor initiation and effort and typically declines to walk preferring to get out of bed to chair only  Severe malnutrition probably secondary to adenocarcinoma, chronic illness multiple liver lesions, metastatic disease Nutrition Status: Nutrition Problem: Severe Malnutrition Etiology: chronic illness (gastric lesion, multiple liver lesions, concern for metastatic disease) Signs/Symptoms: severe fat depletion, severe muscle depletion Interventions: Ensure Enlive (each supplement provides 350kcal and 20 grams of protein), Magic cup Gastric mass extends from antrum into prepyloric area and into pylorus and probably in the Duodenal Bulb. I was not able to see the duodenal bulb clearly.  Unlikely patient will be able to eat appropriately again given degree of cancer invasion and likely would benefit more from comfort feeds Continue easy to digest food and high-protein beverages and shakes-patient with very minimal oral intake and only is remaining hydrated based on low flow IV fluids currently in place     Data Reviewed: Basic Metabolic Panel: Recent Labs  Lab 06/23/20 0446 06/25/20 2334  NA 135 134*  K 3.5 3.9  CL 101 102  CO2 26 25  GLUCOSE 89 95  BUN <5* <5*  CREATININE 0.56* 0.55*  CALCIUM 8.2* 8.3*  MG  --  1.7  PHOS 3.6  --    Liver Function Tests: Recent  Labs  Lab 06/23/20 0446 06/25/20 2334  AST  --  17  ALT  --  9  ALKPHOS  --  74  BILITOT  --  0.6  PROT  --  5.7*  ALBUMIN 1.7* 1.7*   No results for input(s): LIPASE, AMYLASE in the last 168 hours. No results for input(s): AMMONIA in the last 168 hours. CBC: No results for input(s): WBC, NEUTROABS, HGB, HCT, MCV, PLT in the last 168  hours. Cardiac Enzymes: No results for input(s): CKTOTAL, CKMB, CKMBINDEX, TROPONINI in the last 168 hours. BNP (last 3 results) Recent Labs    03/07/20 0710  BNP 74.6    ProBNP (last 3 results) No results for input(s): PROBNP in the last 8760 hours.  CBG: No results for input(s): GLUCAP in the last 168 hours.  No results found for this or any previous visit (from the past 240 hour(s)).   Studies: No results found.  Scheduled Meds: . clonazePAM  0.5 mg Oral BID  . feeding supplement (ENSURE ENLIVE)  237 mL Oral QID  . ferrous sulfate  325 mg Oral Q breakfast  . FLUoxetine  10 mg Oral QHS  . magnesium oxide  400 mg Oral BID  . OXcarbazepine  75 mg Oral BID  . pantoprazole  40 mg Oral Daily  . polyethylene glycol  17 g Oral BID  . potassium chloride  20 mEq Oral Daily  . QUEtiapine  100 mg Oral Daily  . QUEtiapine  150 mg Oral QHS  . senna-docusate  1 tablet Oral BID   Continuous Infusions: . sodium chloride    . sodium chloride 50 mL/hr at 06/26/20 1900    Principal Problem:   Cardiac arrest [facility]) Active Problems:   Encounter for central line placement   GI bleed   Acute respiratory failure (HCC)   Anoxic encephalopathy ([place])   Cerebral embolism with cerebral infarction   Pulmonary emboli ([place])   Palliative care by specialist   Goals of care, counseling/discussion   DNR (do not resuscitate)   Status post tracheostomy ([place])   Protein-calorie malnutrition, severe   Neuroendocrine cancer ([place])     Code Status: DNR Family Communication: [name] niece updated 7/2.  She is aware that primary reason for remaining in [place] is lack of bed availability.  She has been updated on patient's poor intake in relation to location of gastric mass.  She is aware he is not having any abdominal pain and is overall comfortable. **TOC attempted to speak with Ms. [name] again on 7/8 update on lack of bed offers in referred area. Niece did not answer her phone and  voicemail has not been set up. DVT prophylaxis: SCDs   Consultants:  Palliative medicine team  Oncology  PCCM  Procedures:  Echocardiogram  EEG  EGD  Cortrack feeding tube  Antibiotics: Anti-infectives (From admission, onward)   Start     Dose/Rate Route Frequency Ordered Stop   06/02/20 1630  Ampicillin-Sulbactam (UNASYN) 3 g in sodium chloride 0.9 % 100 mL IVPB        3 g 200 mL/hr over 30 Minutes Intravenous Every 6 hours 06/02/20 1626 06/10/20 2215   06/02/20 0645  cefTRIAXone (ROCEPHIN) 2 g in sodium chloride 0.9 % 100 mL IVPB  Status:  Discontinued        2 g 200 mL/hr over 30 Minutes Intravenous Daily 06/02/20 0622 06/02/20 1607   06/01/20 0715  cefTRIAXone (ROCEPHIN) 1 g in sodium chloride 0.9 % 100 mL IVPB  Status:  Discontinued        1 g 200 mL/hr over 30 Minutes Intravenous Every 24 hours 06/01/20 0710 06/02/20 0622   03/26/20 1100  doxycycline (VIBRAMYCIN) 100 mg in sodium chloride 0.9 % 250 mL IVPB  Status:  Discontinued        100 mg 125 mL/hr over 120 Minutes Intravenous 2 times daily 03/26/20 1002 04/01/20 1035   03/25/20 1100  doxycycline (VIBRA-TABS) tablet 100 mg  Status:  Discontinued        100 mg Per Tube Every 12 hours 03/25/20 1009 03/26/20 1002   03/25/20 1015  doxycycline (VIBRAMYCIN) 100 mg in sodium chloride 0.9 % 250 mL IVPB  Status:  Discontinued       Note to Pharmacy: To be given after the tracheal aspirate is collected.   100 mg 125 mL/hr over 120 Minutes Intravenous Every 12 hours 03/25/20 1005 03/25/20 1008       Time spent: 15 minutes    Erin Hearing ANP [facility] Pager [phone]. If 7PM-7AM, please contact night-coverage at [email or web address] 06/27/2020, 11:04 AM  LOS: 112 days

## 2020-06-28 NOTE — Progress Notes (Signed)
OT Cancellation Note  Patient Details Name: Kenneth Sullivan MRN: [id number] DOB: Oct 07, 1956   Cancelled Treatment:    Reason Eval/Treat Not Completed: Fatigue/lethargy limiting ability to participate;Other (comment) Per RN, pt agitated and restless earlier today and is now asleep, resting in bed. Advised to avoid waking pt at this time. Will re-attempt at later time.   Layla Maw 06/28/2020, 2:38 PM

## 2020-06-28 NOTE — TOC Progression Note (Signed)
Transition of Care [facility]) - Progression Note    Patient Details  Name: Kenneth Sullivan MRN: [id number] Date of Birth: 1956-06-05  Transition of Care [facility]) CM/SW [place], RN Phone Number: [phone] 06/28/2020, 3:16 PM  Clinical Narrative:    CM at bedside to make patient aware of potential bed at [facility] in [place]. Patient states that it would be ok with him. CM attempted to call niece [name] to update her. There is no answer and voicemail has not been set up.    Expected Discharge Plan: Skilled Nursing Facility Barriers to Discharge: No SNF bed  Expected Discharge Plan and Services Expected Discharge Plan: [place] In-house Referral: Clinical Social Work     Living arrangements for the past 2 months: Apartment                                       Social Determinants of Health (SDOH) Interventions    Readmission Risk Interventions No flowsheet data found.

## 2020-06-28 NOTE — Progress Notes (Signed)
[facility] PROGRESS NOTE  Dave Mergen GHW:[id number] DOB: 1956/05/27 DOA: 03/07/2020 PCP: [facility], [name]  Status: Inpatient--Remains inpatient appropriate because:Altered mental status, Unsafe d/c plan and Inpatient level of care appropriate due to severity of illness   Dispo: The patient is from: Home              Anticipated d/c is to: SNF with hospice              Anticipated d/c date is: Next 48-hour              Patient currently is medically stable to d/c.   **Niece has agreed to look for beds outside of the [place] area and as of 7/14 a bed has become available in [facility]. TOC in process of contacting niece to confirm she is okay with this bed into shape process of transitioning patients Medicaid/disability check over to facility to pay for care **Home hospice considered but niece (who would be primary caretaker) is unable to take the patient home as she is working multiple jobs.  She agrees with hospice care but placement and location of care remain difficult since patient has Medicaid.  HPI: 64 year old gentleman with no prior medical history initially presented to ED with shortness of breath and acute respiratory failure, had a witnessed bradycardic asystolic arrest.  He underwent normothermia protocol and was found to have massive PE and DVT with right ventricular strain.  Hospital course was initially complicated by upper GI bleed due to gastric ulcer.  CT abdomen pelvis showed a large antral ulcer, mass with multiple liver lesions and biopsy confirmed high-grade neuroendocrine tumor.  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  Patient underwent IR guided mechanical thrombectomy for his massive PE and IVC filter.  Postprocedure while on heparin drip patient started having maroon-colored red stool and bloody drainage  from the OGT.  GI consulted and he underwent endoscopy showing gastric ulcer with blood clots.  IR consulted and  he underwent GDA embolization.  Hospital course was further complicated by multiple acute to subacute ischemic infarcts involving bilateral cerebral hemispheres consistent with global hypoperfusion and also associated with scattered petechial hemorrhages with evidence of hemorrhagic conversion in the parietal lobe.  Oncology consulted and initially discussed with patient's adenocarcinoma diagnosis with the patient's niece over the phone recommended chemotherapy versus hospice.Palliative care consulted and niece agrees with hospice care.  Unfortunately she is now working multiple jobs unable to accommodate taking patient home for home hospice.  Placement has been difficult given patient having Medicare and lack of available beds.  Subjective: Alert.  Very hungry and eating well with assistance of nurse at bedside  Objective: Vitals:   06/27/20 1955 06/28/20 0731  BP: 130/82 (!) 135/93  Pulse: 97 94  Resp: 16   Temp: 98 F (36.7 C) 98.2 F (36.8 C)  SpO2: 98% 98%    Intake/Output Summary (Last 24 hours) at 06/28/2020 1147 Last data filed at 06/28/2020 1000 Gross per 24 hour  Intake 808.43 ml  Output --  Net 808.43 ml   Filed Weights   05/31/20 0500 06/01/20 0254 06/11/20 0513  Weight: 63.5 kg 66.7 kg 80.7 kg    Exam: Constitutional: NAD, no acute distress, appears older than stated age ENMT: Mucous membranes are moist.Normal dentition.  Respiratory: clear to auscultation bilaterally, Normal respiratory effort.  Room air-chronically coughs up thick yellow sputum Cardiovascular: Regular rate and rhythm, no murmurs / rubs / gallops. No extremity edema. 2+ pedal pulses.  Normal saline at 50 cc/h Abdomen: no tenderness, no masses palpated.  Bowel sounds positive.  Poor oral intake - improved if someone feeds patient Musculoskeletal: no clubbing / cyanosis. No joint deformity upper and lower extremities. no contractures.  Skin: no rashes, lesions, ulcers. No induration Neurologic: CN  2-12 grossly intact. Sensation intact, DTR normal. Strength 4/5 x all 4 extremities.  Psychiatric: Alert, oriented to name only-does not but continues flat affect   Assessment/Plan: Acute hypoxic respiratory failure in the setting of massive PE/cardiac arrest Status post mechanical ventilation and tracheostomy.   Resolved Now decannulated and stable on room air.  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 Currently not on any anticoagulation due to GI bleed and CVA w/ hemorrhagic conversion.  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 Continue Prozac, Klonopin and Seroquel 100 mg am and 150 mg HS EKG from 7/6 with a corrected QTC of 450 ms based on RBB QRS= 122 ms and J-T= 328 ms.  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 Due to location of gastric mass it is not unexpected that patient unable to eat/has significant anorexia early satiety-recommendation is for easy to digest foods with focus on high-protein beverages and shakes  Sepsis 2/2 Streptococcus bacteremia Resolved Completed 10 days of Unasyn.  Patient remains afebrile but has mild tachycardia and leukocytosis probably secondary  to malignancy and ongoing malnutrition  Constipation As needed Senokot.  Acute hypokalemia Potassium stable on replacement of 20 mEq daily with most recent reading 3.5 Magnesium 1.9  Debility/physical deconditioning 7/12: PT-session affected by patient's ongoing confusion.  He required max encouragement and coaxing but was able to participate.  Bed mobility needs assistance from supine to sit with moderate assistance +2 also +2 assist for transferring from bed to standing to use rolling walker, patient noted with poor initiation and effort and typically declines to walk preferring to get out of bed to chair only  Severe malnutrition probably secondary to adenocarcinoma, chronic illness multiple liver lesions, metastatic disease Nutrition Status: Nutrition Problem: Severe Malnutrition Etiology: chronic illness (gastric lesion, multiple liver lesions, concern for metastatic disease) Signs/Symptoms: severe fat depletion, severe muscle depletion Interventions: Ensure Enlive (each supplement provides 350kcal and 20 grams of protein), Magic cup Gastric mass extends from antrum into prepyloric area and into pylorus and probably in the Duodenal Bulb. I was not able to see the duodenal bulb clearly.  Unlikely patient will be able to eat appropriately again given degree of cancer invasion and likely would benefit more from comfort feeds Continue easy to digest food and high-protein beverages and shakes-patient with very minimal oral intake and only is remaining hydrated based on low flow IV fluids currently in place     Data Reviewed: Basic Metabolic Panel: Recent Labs  Lab 06/23/20 0446 06/25/20 2334  NA 135 134*  K 3.5 3.9  CL 101 102  CO2 26 25  GLUCOSE 89 95  BUN <5* <5*  CREATININE 0.56* 0.55*  CALCIUM 8.2* 8.3*  MG  --  1.7  PHOS 3.6  --    Liver Function Tests: Recent Labs  Lab 06/23/20 0446 06/25/20 2334  AST  --  17  ALT  --  9  ALKPHOS  --  74  BILITOT  --  0.6   PROT  --  5.7*  ALBUMIN 1.7* 1.7*   No results for input(s): LIPASE, AMYLASE in the last 168 hours. No results for input(s): AMMONIA in the last 168 hours. CBC: No results for input(s): WBC, NEUTROABS, HGB, HCT, MCV, PLT in the last 168 hours. Cardiac Enzymes: No results for input(s): CKTOTAL, CKMB, CKMBINDEX, TROPONINI in the last 168 hours. BNP (last 3 results) Recent Labs    03/07/20 0710  BNP 74.6    ProBNP (last 3 results) No results for input(s): PROBNP in the last 8760 hours.  CBG: No results for input(s): GLUCAP in the last 168 hours.  No results found for this or any previous visit (from the past 240 hour(s)).   Studies: No results found.  Scheduled Meds: . clonazePAM  0.5 mg Oral BID  . feeding supplement (ENSURE ENLIVE)  237 mL Oral QID  . ferrous sulfate  325 mg Oral Q breakfast  . FLUoxetine  10 mg Oral QHS  . OXcarbazepine  75 mg Oral BID  . pantoprazole  40 mg Oral Daily  . polyethylene glycol  17 g Oral BID  . potassium chloride  20 mEq Oral Daily  . QUEtiapine  100 mg Oral Daily  . QUEtiapine  150 mg Oral QHS  . senna-docusate  1 tablet Oral BID   Continuous Infusions: . sodium chloride    . sodium chloride 50 mL/hr at 06/28/20 0024    Principal Problem:   Cardiac arrest [facility]) Active Problems:   Encounter for central line placement   GI bleed   Acute respiratory failure (HCC)   Anoxic encephalopathy ([place])   Cerebral embolism with cerebral infarction   Pulmonary emboli ([place])   Palliative care by specialist   Goals of care, counseling/discussion   DNR (do not resuscitate)   Status post tracheostomy ([place])   Protein-calorie malnutrition, severe   Neuroendocrine cancer ([place])     Code Status: DNR Family Communication: [name] niece updated 7/2.  She is aware that primary reason for remaining in [place] is lack of bed availability.  She has been updated on patient's poor intake in relation to location of gastric mass.  She is aware  he is not having any abdominal pain and is overall comfortable. **TOC attempted to speak with Ms. [name] again on 7/8 update on lack of bed offers in referred area. Niece did not answer her phone and voicemail has not been set up. DVT prophylaxis: SCDs   Consultants:  Palliative medicine team  Oncology  PCCM  Procedures:  Echocardiogram  EEG  EGD  Cortrack feeding tube  Antibiotics: Anti-infectives (From admission, onward)   Start     Dose/Rate Route Frequency Ordered Stop   06/02/20 1630  Ampicillin-Sulbactam (UNASYN) 3 g in sodium chloride 0.9 % 100 mL IVPB        3 g 200 mL/hr over 30 Minutes Intravenous Every 6 hours 06/02/20 1626 06/10/20 2215   06/02/20 0645  cefTRIAXone (ROCEPHIN) 2 g in sodium chloride 0.9 % 100 mL IVPB  Status:  Discontinued  2 g 200 mL/hr over 30 Minutes Intravenous Daily 06/02/20 0622 06/02/20 1607   06/01/20 0715  cefTRIAXone (ROCEPHIN) 1 g in sodium chloride 0.9 % 100 mL IVPB  Status:  Discontinued        1 g 200 mL/hr over 30 Minutes Intravenous Every 24 hours 06/01/20 0710 06/02/20 0622   03/26/20 1100  doxycycline (VIBRAMYCIN) 100 mg in sodium chloride 0.9 % 250 mL IVPB  Status:  Discontinued        100 mg 125 mL/hr over 120 Minutes Intravenous 2 times daily 03/26/20 1002 04/01/20 1035   03/25/20 1100  doxycycline (VIBRA-TABS) tablet 100 mg  Status:  Discontinued        100 mg Per Tube Every 12 hours 03/25/20 1009 03/26/20 1002   03/25/20 1015  doxycycline (VIBRAMYCIN) 100 mg in sodium chloride 0.9 % 250 mL IVPB  Status:  Discontinued       Note to Pharmacy: To be given after the tracheal aspirate is collected.   100 mg 125 mL/hr over 120 Minutes Intravenous Every 12 hours 03/25/20 1005 03/25/20 1008       Time spent: 15 minutes    Erin Hearing ANP [facility] Pager [phone]. If 7PM-7AM, please contact night-coverage at [email or web address] 06/28/2020, 11:47 AM  LOS: 113 days

## 2020-06-28 NOTE — TOC Progression Note (Signed)
Transition of Care [facility]) - Progression Note    Patient Details  Name: Kenneth Sullivan MRN: [id number] Date of Birth: Apr 17, 1956  Transition of Care [facility]) CM/SW [place], RN Phone Number: [phone]  06/28/2020, 12:06 PM  Clinical Narrative:    Damaris Schooner with Hinton Dyer at [facility] in [place].The facility is starting authorization for managed medicaid.     Expected Discharge Plan: Skilled Nursing Facility Barriers to Discharge: No SNF bed  Expected Discharge Plan and Services Expected Discharge Plan: [place] In-house Referral: Clinical Social Work     Living arrangements for the past 2 months: Apartment                                       Social Determinants of Health (SDOH) Interventions    Readmission Risk Interventions No flowsheet data found.

## 2020-06-29 NOTE — Progress Notes (Signed)
Nutrition Follow-up  DOCUMENTATION CODES:   Underweight, Severe malnutrition in context of chronic illness  INTERVENTION:   Unable to place Cortrak given severity of gastric cancer.    Feeding assistance with meals and snacks  Ensure Enlive poQID, each supplement provides 350 kcal and 20 grams of protein  MagicCup TID with meals, each supplement provides 290 kcal and 9 grams of protein  NUTRITION DIAGNOSIS:   Severe Malnutrition related to chronic illness (gastric lesion, multiple liver lesions, concern for metastatic disease) as evidenced by severe fat depletion, severe muscle depletion.  Ongoing  GOAL:   Patient will meet greater than or equal to 90% of their needs  [place]  MONITOR:   PO intake, Supplement acceptance, Labs, Weight trends  REASON FOR ASSESSMENT:   Ventilator    ASSESSMENT:   64 yo male admitted S/P cardiac arrest, S/P normothermia protocol. Found to have massive PE, DVT, GI bleed from gastric ulcer. PMH includes current smoker, heavy alcohol use.  4/02 - trach  4/09 - Cortrak placed  4/11- pt pulled out trach, replaced by RT,TF held due to Cortrak leaking  4/13 - MBS, Dysphagia 1 with thin liquids started 4/14 - Cortrak removed 4/16 - Cortrak replaced 4/22 - Cortrak clogged, replaced by diagnostic radiology (tip in pre-pyloric region of stomach) 4/25 - diet advanced to Dysphagia 2 4/27 - tube feeds changed to nocturnal 4/28 - decannulated 4/30 - pulled Cortrak, TF stopped 5/03 - diet upgraded to Dysphagia 3 5/05 - diet upgraded to Regular 5/19 - s/p liver lesion biopsy by IR 5/28- EGD reveals gastric adenocarcinoma  Pt eating a little more the last couple of days. Last two meal completions charted as 20%. Taking Ensure inconsistently throughout the day. RD observed Ensure bottle unopened at bedside. Will continue current supplements.   Plan d/c to SNF in [place].   Admission weight: 73.6 kg  Current weight: 80.7 kg (taken  6/27)  Drips: NS @ 50 ml/hr  Medications: miralax, 20 mEq KCl daily, senokot Labs: Na 134 (L)   Diet Order:   Diet Order            DIET DYS 3 Room service appropriate? Yes; Fluid consistency: Thin  Diet effective now                 EDUCATION NEEDS:   Not appropriate for education at this time  Skin:  Skin Assessment: Reviewed RN Assessment Skin Integrity Issues:: Other (Comment) Other: puncture neck, groin  Last BM:  7/15  Height:   Ht Readings from Last 1 Encounters:  04/29/20 6\' 2"  (1.88 m)    Weight:   Wt Readings from Last 1 Encounters:  06/11/20 80.7 kg    Ideal Body Weight:  86.4 kg  BMI:  Body mass index is 22.85 kg/m.  Estimated Nutritional Needs:   Kcal:  2100-2300  Protein:  110-125 gm  Fluid:  >/= 2 L   Mariana Single RD, LDN Clinical Nutrition Pager listed in [place]

## 2020-06-29 NOTE — Progress Notes (Signed)
Physical Therapy Treatment Patient Details Name: Kenneth Sullivan MRN: [id number] DOB: Feb 17, 1956 Today's Date: 06/29/2020    History of Present Illness 64 y.o. male admitted 03/07/20 with acute SOB; had bradycardic event with asystole/cardiac arrest, ROSC achieved in 20 minutes with 3 rounds epinephrine; intubated 3/23. Pt with massive PE/DVT and RV strain. S/p IR guided mechanical thrombectomy and IVC filter placement. Devoloped gastric ulcer s/p GDA embolization. MRI 3/27 revealed multifocal acute to subacute ischemic infarct involving bilateral cerebral hemisphere consistent with global hypoperfusion; associated scattered petechial hemorrhages with evidence of hemmorhagic conversion in R parietal lobe. S/p trach 4/5, changed to cuffless 4/22; decannulated 4/28. Liver biopsy 5/19. 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 On 06/01/20 - pt with UTI.  On 06/08/20 - pt continues to have a fever, mention of possible hospice in notes if does not improve. PMH unknown.    PT Comments    Pt required mod verbal encouragement to participate in PT/OT session, but once motivated pt participated well. Pt tolerated repeated transfers, x2 bouts gait in room, and standing tasks at sink with OT. Pt requires mod +2 for mobility given weakness and cognitive impairments. PT continuing to recommend SNF level of care post-acutely, will continue to follow.     Follow Up Recommendations  Supervision/Assistance - 24 hour;SNF     Equipment Recommendations  Wheelchair cushion (measurements PT);Wheelchair (measurements PT);3in1 (PT)    Recommendations for Other Services       Precautions / Restrictions Precautions Precautions: Fall Precaution Comments: impulsive Restrictions Weight Bearing Restrictions: No    Mobility  Bed Mobility Overal bed mobility: Needs Assistance Bed Mobility: Supine to Sit;Sit to Supine     Supine to sit: Mod  assist;HOB elevated;+2 for physical assistance Sit to supine: Mod assist;+2 for physical assistance   General bed mobility comments: Mod assist +2 for trunk and LE management, scooting up in bed with use of bed pads.  Transfers Overall transfer level: Needs assistance Equipment used: 2 person hand held assist Transfers: Sit to/from [facility] Sit to Stand: Mod assist;+2 physical assistance Stand pivot transfers: Mod assist;+2 physical assistance       General transfer comment: Mod assist for power up, steadying, LE blocking and facilitation for knee extension. Stand x3, from EOB x2 and from chair in front of sink x1. Max cuing for safety during standing, hand placement on sink when standing in front of sink.  Ambulation/Gait Ambulation/Gait assistance: Mod assist;+2 safety/equipment Gait Distance (Feet): 6 Feet (2x6 ft to and from sink) Assistive device: 2 person hand held assist Gait Pattern/deviations: Step-through pattern;Decreased stride length;Shuffle;Trunk flexed;Narrow base of support Gait velocity: decr   General Gait Details: Mod assist +2 for steadying, guiding pt trajectory, supporting pt trunk during gait as pt with significant LE weakness.   Stairs             Wheelchair Mobility    Modified Rankin (Stroke Patients Only) Modified Rankin (Stroke Patients Only) Pre-Morbid Rankin Score: No symptoms Modified Rankin: Moderately severe disability     Balance Overall balance assessment: Needs assistance Sitting-balance support: No upper extremity supported;Feet supported Sitting balance-Leahy Scale: Fair Sitting balance - Comments: close supervision for static sitting balance    Standing balance support: Bilateral upper extremity supported;During functional activity Standing balance-Leahy Scale: Poor Standing balance comment: reliant on external support  Cognition Arousal/Alertness:  Awake/alert Behavior During Therapy: Flat affect;Impulsive Overall Cognitive Status: Impaired/Different from baseline Area of Impairment: Attention;Following commands;Safety/judgement;Awareness;Problem solving;Memory;Orientation                 Orientation Level: Situation;Disoriented to;Place;Time Current Attention Level: Sustained Memory: Decreased short-term memory;Decreased recall of precautions Following Commands: Follows one step commands inconsistently Safety/Judgement: Decreased awareness of deficits;Decreased awareness of safety Awareness: Intellectual Problem Solving: Requires tactile cues;Requires verbal cues;Slow processing;Decreased initiation;Difficulty sequencing General Comments: Pt repeats multiple times during session "wait, what are we doing?" after being cued seconds before about mobility. Follows cues inconsistently, significant STM deficits. Requires multimodal cuing and at times physical assist to execute tasks due to poor pt safety awareness.      Exercises      General Comments General comments (skin integrity, edema, etc.): VSS       Pertinent Vitals/Pain Pain Assessment: Faces Faces Pain Scale: Hurts a little bit Pain Location: generalized with movement Pain Descriptors / Indicators: Discomfort Pain Intervention(s): Limited activity within patient's tolerance;Monitored during session;Repositioned;Premedicated before session    Home Living                      Prior Function            PT Goals (current goals can now be found in the care plan section) Acute Rehab PT Goals Patient Stated Goal: pt did not state PT Goal Formulation: With patient Time For Goal Achievement: 07/13/20 Potential to Achieve Goals: Poor Progress towards PT goals: Progressing toward goals    Frequency    Min 2X/week      PT Plan Current plan remains appropriate    Co-evaluation PT/OT/SLP Co-Evaluation/Treatment: Yes Reason for Co-Treatment:  Complexity of the patient's impairments (multi-system involvement);Necessary to address cognition/behavior during functional activity;For patient/therapist safety;To address functional/ADL transfers PT goals addressed during session: Mobility/safety with mobility;Balance OT goals addressed during session: ADL's and self-care      AM-PAC PT "6 Clicks" Mobility   Outcome Measure  Help needed turning from your back to your side while in a flat bed without using bedrails?: A Little Help needed moving from lying on your back to sitting on the side of a flat bed without using bedrails?: A Lot Help needed moving to and from a bed to a chair (including a wheelchair)?: A Lot Help needed standing up from a chair using your arms (e.g., wheelchair or bedside chair)?: A Lot Help needed to walk in hospital room?: A Lot Help needed climbing 3-5 steps with a railing? : Total 6 Click Score: 12    End of Session Equipment Utilized During Treatment: Gait belt Activity Tolerance: Patient limited by fatigue;Other (comment) (cogniton) Patient left: with call bell/phone within reach;in bed;with bed alarm set Nurse Communication: Mobility status PT Visit Diagnosis: Unsteadiness on feet (R26.81);Other abnormalities of gait and mobility (R26.89)     Time: 1010-1035 PT Time Calculation (min) (ACUTE ONLY): 25 min  Charges:  $Gait Training: 8-22 mins                     Renate Danh E, PT [place] Pager [phone]  Office [phone]   Arlyn Bumpus D Elonda Husky 06/29/2020, 11:58 AM

## 2020-06-29 NOTE — Progress Notes (Addendum)
[facility] PROGRESS NOTE  Kenneth Sullivan YQM:[id number] DOB: [birth year] DOA: 03/07/2020 PCP: [facility], [name]  Status: Inpatient--Remains inpatient appropriate because:Altered mental status, Unsafe d/c plan and Inpatient level of care appropriate due to severity of illness   Dispo: The patient is from: Home              Anticipated d/c is to: SNF with hospice              Anticipated d/c date is: Next 48-hours              Patient currently is medically stable to d/c. -Barriers to discharge: [facility] provider attempting to contact niece to confirm that she will allow facility to accept patients disability check and payment for services to be provided at SNF  **Niece has agreed to look for beds outside of the [place] area and as of 7/14 a bed has become available in [facility]. TOC in process of contacting niece to confirm she is okay with this bed into shape process of transitioning patients Medicaid/disability check over to facility to pay for care **Home hospice considered but niece (who would be primary caretaker) is unable to take the patient home as she is working multiple jobs.  She agrees with hospice care but placement and location of care remain difficult since patient has Medicaid.  Code Status: DNR Family Communication: [name] niece updated 7/2.   7/15: TOC will continue to contact niece in order to arrange for appropriate payments or so patient can discharge to SNF in [facility] DVT prophylaxis: SCDs   HPI: 64 year old gentleman with no prior medical history initially presented to ED with shortness of breath and acute respiratory failure, had a witnessed bradycardic asystolic arrest.  He underwent normothermia protocol and was found to have massive PE and DVT with right ventricular strain.  Hospital course was initially complicated by upper GI bleed due to gastric ulcer.  CT abdomen pelvis showed a large antral ulcer, mass with multiple  liver lesions and biopsy confirmed high-grade neuroendocrine tumor.  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  Patient underwent IR guided mechanical thrombectomy for his massive PE and IVC filter.  Postprocedure while on heparin drip patient started having maroon-colored red stool and bloody drainage  from the OGT.  GI consulted and he underwent endoscopy showing gastric ulcer with blood clots.  IR consulted and he underwent GDA embolization.  Hospital course was further complicated by multiple acute to subacute ischemic infarcts involving bilateral cerebral hemispheres consistent with global hypoperfusion and also associated with scattered petechial hemorrhages with evidence of hemorrhagic conversion in the parietal lobe.  Oncology consulted and initially discussed with patient's adenocarcinoma diagnosis with the patient's niece over the phone recommended chemotherapy versus hospice.Palliative care consulted and niece agrees with hospice care.  Unfortunately she is now working multiple jobs unable to accommodate taking patient home for home hospice.  Placement has been difficult given patient having Medicare and lack of available beds.  Subjective: Alert.  No specific complaints.  Updated plan is to discharge to a facility in [place], [place] once payment arrangements can be clarified.  Objective: Vitals:   06/28/20 2300 06/29/20 0858  BP:  126/81  Pulse: 92 (!) 108  Resp: 16 18  Temp:  98.6 F (37 C)  SpO2:  99%    Intake/Output Summary (Last 24 hours) at 06/29/2020 1232 Last data filed at 06/29/2020 0800 Gross per 24 hour  Intake 1969.4 ml  Output --  Net 1969.4 ml   Filed Weights   05/31/20 0500 06/01/20 0254 06/11/20 0513  Weight: 63.5 kg 66.7 kg 80.7 kg    Exam: Constitutional: NAD, no acute distress, appears older than stated age ENMT: Mucous membranes are moist.Normal dentition.  Respiratory: clear to auscultation bilaterally, Normal respiratory  effort.  Room air-chronically coughs up thick yellow sputum Cardiovascular: Regular rate and rhythm, no murmurs / rubs / gallops. No extremity edema. 2+ pedal pulses.  Normal saline at 50 cc/h Abdomen: no tenderness, no masses palpated.  Bowel sounds positive.  Poor oral intake - improved if someone feeds patient Musculoskeletal: no clubbing / cyanosis. No joint deformity upper and lower extremities. no contractures.  Skin: no rashes, lesions, ulcers ie unremarkable Neurologic: CN 2-12 grossly intact. Sensation intact, DTR normal. Strength 4/5 x all 4 extremities.  Psychiatric: Alert, oriented to name only-does not but continues flat affect-appears to have short-term memory deficits and lacks capacity to make independent decisions regarding ADLs and IADLs.   Assessment/Plan: Acute hypoxic respiratory failure in the setting of massive PE/cardiac arrest Status post mechanical ventilation and tracheostomy.   Resolved Now decannulated and stable on room air.  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 Currently not on any anticoagulation due to GI bleed and CVA w/ hemorrhagic conversion.  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 Continue Prozac, Klonopin and Seroquel 100 mg am and 150 mg HS EKG from 7/6 with a corrected QTC of 450 ms based on RBB QRS= 122 ms and J-T= 328 ms.  Follow EKG as needed plan to discharge on current dosages of psychotropic medications.  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 Due to location of gastric mass it is not unexpected that patient unable to eat/has significant anorexia early satiety-recommendation is for easy to digest foods with focus on high-protein beverages and shakes  Sepsis 2/2 Streptococcus bacteremia Resolved Completed 10 days of Unasyn.  Patient remains afebrile but has mild tachycardia and leukocytosis probably secondary to malignancy and ongoing malnutrition  Constipation As needed Senokot.  Acute hypokalemia Potassium stable on replacement of 20 mEq daily with most recent reading 3.5 Magnesium 1.9  Debility/physical deconditioning 7/12: PT-session affected by patient's ongoing confusion.  He required max encouragement and coaxing but was able to participate.  Bed mobility needs assistance from supine to sit with moderate assistance +2 also +2 assist for transferring from bed to standing to use rolling walker, patient noted with poor initiation and effort and typically declines to walk preferring to get out of bed to chair only  Severe malnutrition probably secondary to adenocarcinoma, chronic illness multiple liver lesions, metastatic disease Nutrition Status: Nutrition Problem: Severe Malnutrition Etiology: chronic illness (gastric lesion, multiple liver lesions, concern for metastatic disease) Signs/Symptoms: severe fat depletion, severe muscle depletion Interventions: Ensure Enlive (each supplement provides 350kcal and 20 grams of protein), Magic cup Gastric mass  extends from antrum into prepyloric area and into pylorus and probably in the Duodenal Bulb. I was not able to see the duodenal bulb clearly.  Unlikely patient will be able to eat  appropriately again given degree of cancer invasion and likely would benefit more from comfort feeds Continue easy to digest food and high-protein beverages and shakes-patient with very minimal oral intake and only is remaining hydrated based on low flow IV fluids currently in place     Data Reviewed: Basic Metabolic Panel: Recent Labs  Lab 06/23/20 0446 06/25/20 2334  NA 135 134*  K 3.5 3.9  CL 101 102  CO2 26 25  GLUCOSE 89 95  BUN <5* <5*  CREATININE 0.56* 0.55*  CALCIUM 8.2* 8.3*  MG  --  1.7  PHOS 3.6  --    Liver Function Tests: Recent Labs  Lab 06/23/20 0446 06/25/20 2334  AST  --  17  ALT  --  9  ALKPHOS  --  74  BILITOT  --  0.6  PROT  --  5.7*  ALBUMIN 1.7* 1.7*   No results for input(s): LIPASE, AMYLASE in the last 168 hours. No results for input(s): AMMONIA in the last 168 hours. CBC: No results for input(s): WBC, NEUTROABS, HGB, HCT, MCV, PLT in the last 168 hours. Cardiac Enzymes: No results for input(s): CKTOTAL, CKMB, CKMBINDEX, TROPONINI in the last 168 hours. BNP (last 3 results) Recent Labs    03/07/20 0710  BNP 74.6    ProBNP (last 3 results) No results for input(s): PROBNP in the last 8760 hours.  CBG: No results for input(s): GLUCAP in the last 168 hours.  No results found for this or any previous visit (from the past 240 hour(s)).   Studies: No results found.  Scheduled Meds:  clonazePAM  0.5 mg Oral BID   feeding supplement (ENSURE ENLIVE)  237 mL Oral QID   ferrous sulfate  325 mg Oral Q breakfast   FLUoxetine  10 mg Oral QHS   OXcarbazepine  75 mg Oral BID   pantoprazole  40 mg Oral Daily   polyethylene glycol  17 g Oral BID   potassium chloride  20 mEq Oral Daily   QUEtiapine  100 mg Oral Daily   QUEtiapine  150 mg Oral QHS   senna-docusate  1 tablet Oral BID   Continuous Infusions:  sodium chloride     sodium chloride 50 mL/hr at 06/29/20 0902    Principal Problem:   Cardiac arrest [facility]) Active  Problems:   Encounter for central line placement   GI bleed   Acute respiratory failure (HCC)   Anoxic encephalopathy ([employer])   Cerebral embolism with cerebral infarction   Pulmonary emboli ([place])   Palliative care by specialist   Goals of care, counseling/discussion   DNR (do not resuscitate)   Status post tracheostomy ([place])   Protein-calorie malnutrition, severe   Neuroendocrine cancer ([place])   Consultants:  Palliative medicine team  Oncology  PCCM  Procedures:  Echocardiogram  EEG  EGD  Cortrack feeding tube  Antibiotics: Anti-infectives (From admission, onward)   Start     Dose/Rate Route Frequency Ordered Stop   06/02/20 1630  Ampicillin-Sulbactam (UNASYN) 3 g in sodium chloride 0.9 % 100 mL IVPB        3 g 200 mL/hr over 30 Minutes Intravenous Every 6 hours 06/02/20 1626 06/10/20 2215   06/02/20 0645  cefTRIAXone (ROCEPHIN) 2 g in sodium chloride 0.9 % 100 mL IVPB  Status:  Discontinued        2 g 200 mL/hr over 30 Minutes Intravenous Daily 06/02/20 0622 06/02/20 1607   06/01/20 0715  cefTRIAXone (ROCEPHIN) 1 g in sodium chloride 0.9 % 100 mL IVPB  Status:  Discontinued        1 g 200 mL/hr over 30 Minutes Intravenous Every 24 hours 06/01/20 0710 06/02/20 0622   03/26/20 1100  doxycycline (VIBRAMYCIN) 100 mg in sodium chloride 0.9 % 250 mL IVPB  Status:  Discontinued        100 mg 125 mL/hr over 120 Minutes Intravenous 2 times daily 03/26/20 1002 04/01/20 1035   03/25/20 1100  doxycycline (VIBRA-TABS) tablet 100 mg  Status:  Discontinued        100 mg Per Tube Every 12 hours 03/25/20 1009 03/26/20 1002   03/25/20 1015  doxycycline (VIBRAMYCIN) 100 mg in sodium chloride 0.9 % 250 mL IVPB  Status:  Discontinued       Note to Pharmacy: To be given after the tracheal aspirate is collected.   100 mg 125 mL/hr over 120 Minutes Intravenous Every 12 hours 03/25/20 1005 03/25/20 1008       Time spent: 15 minutes    Erin Hearing ANP [facility] Pager  [phone]. If 7PM-7AM, please contact night-coverage at [email or web address] 06/29/2020, 12:32 PM  LOS: 114 days

## 2020-06-29 NOTE — TOC Progression Note (Signed)
Transition of Care [facility]) - Progression Note    Patient Details  Name: Kenneth Sullivan MRN: [id number] Date of Birth: 10-Jun-1956  Transition of Care [facility]) CM/SW [place], RN Phone Number: [phone]  06/29/2020, 2:36 PM  Clinical Narrative: Damaris Schooner with Hinton Dyer from [facility] in [place]. Updated clinicals needed for insurance auth. PT made aware. Pt and OT saw patient and updated notes. This information has been faxed for insurance authorization. CM unable to reach nieces [name]. Call goes straight to voicemail that has not been set up.        Expected Discharge Plan: Skilled Nursing Facility Barriers to Discharge: No SNF bed  Expected Discharge Plan and Services Expected Discharge Plan: [place] In-house Referral: Clinical Social Work     Living arrangements for the past 2 months: Apartment                                       Social Determinants of Health (SDOH) Interventions    Readmission Risk Interventions No flowsheet data found.

## 2020-06-29 NOTE — Progress Notes (Signed)
Occupational Therapy Treatment Patient Details Name: Stonewall Doss MRN: [id number] DOB: 02-12-1956 Today's Date: 06/29/2020    History of present illness 64 y.o. male admitted 03/07/20 with acute SOB; had bradycardic event with asystole/cardiac arrest, ROSC achieved in 20 minutes with 3 rounds epinephrine; intubated 3/23. Pt with massive PE/DVT and RV strain. S/p IR guided mechanical thrombectomy and IVC filter placement. Devoloped gastric ulcer s/p GDA embolization. MRI 3/27 revealed multifocal acute to subacute ischemic infarct involving bilateral cerebral hemisphere consistent with global hypoperfusion; associated scattered petechial hemorrhages with evidence of hemmorhagic conversion in R parietal lobe. S/p trach 4/5, changed to cuffless 4/22; decannulated 4/28. Liver biopsy 5/19. 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 On 06/01/20 - pt with UTI.  On 06/08/20 - pt continues to have a fever, mention of possible hospice in notes if does not improve. PMH unknown.   OT comments  With encouragement and education, pt agreeable to work with therapies. Pt continues to be limited by deficits in cognition, strength, endurance, standing balance impacting ability to perform daily tasks safely. Pt requires Min A x 2 to Mod A x 2 for transfers and short distance mobility to sink with handheld assist. Pt overall Mod A for grooming tasks in standing with consistent cues needed for initiation/completion of task. Pt with improved ability to complete grooming tasks when seated at [facility] remains appropriate.    Follow Up Recommendations  SNF;Supervision/Assistance - 24 hour    Equipment Recommendations  Other (comment) (TBD)    Recommendations for Other Services      Precautions / Restrictions Precautions Precautions: Fall Precaution Comments: impulsive Restrictions Weight Bearing Restrictions: No       Mobility Bed  Mobility Overal bed mobility: Needs Assistance Bed Mobility: Supine to Sit;Sit to Supine     Supine to sit: Mod assist;HOB elevated Sit to supine: Mod assist   General bed mobility comments: Grossly Mod A for bed mobility to initiate tasks, advance B LE and support trunk  Transfers Overall transfer level: Needs assistance Equipment used: 2 person hand held assist Transfers: Sit to/from [employer] Transfers Sit to Stand: Mod assist;+2 physical assistance Stand pivot transfers: Mod assist;+2 physical assistance       General transfer comment: Pt varying from Min A to Mod A x 2 for sit to stand transfers with cues for safety and sequencing, Pt requires consistent cues for safety in turning back to bed due to tendency for premature sitting     Balance Overall balance assessment: Needs assistance Sitting-balance support: No upper extremity supported;Feet supported Sitting balance-Leahy Scale: Fair Sitting balance - Comments: close supervision for static sitting balance    Standing balance support: Bilateral upper extremity supported;During functional activity Standing balance-Leahy Scale: Poor Standing balance comment: reliant on external support                           ADL either performed or assessed with clinical judgement   ADL Overall ADL's : Needs assistance/impaired     Grooming: Moderate assistance;Standing;Oral care;Wash/dry face Grooming Details (indicate cue type and reason): Pt Mod A for oral care sitting and standing at sink, assistance needed to initiate task and maintain standing balance due to pt tendency to attempt sitting down. Pt with difficulty rinsing in standing, but able to demonstrate rinsing mouth and washing face once seated         Upper Body Dressing :  Moderate assistance;Bed level Upper Body Dressing Details (indicate cue type and reason): Mod A to don new hospital gown         Toileting- Clothing Manipulation and Hygiene:  Maximal assistance;Sit to/from stand Toileting - Clothing Manipulation Details (indicate cue type and reason): Max A for posterior care in standing with HHA x 2     Functional mobility during ADLs: Moderate assistance;+2 for physical assistance;+2 for safety/equipment General ADL Comments: Pt with increased ability to complete simple grooming tasks while seated and bedlevel, increased difficulty in standing. Pt continues with deficits in strength, endurance, balance, and cognition     Vision   Vision Assessment?: Vision impaired- to be further tested in functional context   Perception     Praxis      Cognition Arousal/Alertness: Awake/alert Behavior During Therapy: Flat affect;Impulsive Overall Cognitive Status: Impaired/Different from baseline Area of Impairment: Attention;Following commands;Safety/judgement;Awareness;Problem solving;Memory;Orientation                 Orientation Level: Situation;Disoriented to;Place;Time Current Attention Level: Sustained Memory: Decreased short-term memory;Decreased recall of precautions Following Commands: Follows one step commands inconsistently Safety/Judgement: Decreased awareness of deficits;Decreased awareness of safety Awareness: Intellectual Problem Solving: Requires tactile cues;Requires verbal cues;Slow processing;Decreased initiation;Difficulty sequencing General Comments: pt continues with confusion, follows one step commands inconsistently. Requires max encouragement and education to attempt tasks         Exercises     Shoulder Instructions       General Comments VSS     Pertinent Vitals/ Pain       Pain Assessment: Faces Faces Pain Scale: Hurts a little bit Pain Location: generalized with movement Pain Descriptors / Indicators: Discomfort Pain Intervention(s): Monitored during session  Home Living                                          Prior Functioning/Environment               Frequency  Min 1X/week        Progress Toward Goals  OT Goals(current goals can now be found in the care plan section)  Progress towards OT goals: Progressing toward goals  Acute Rehab OT Goals Patient Stated Goal: pt did not state OT Goal Formulation: With patient Time For Goal Achievement: 07/03/20 Potential to Achieve Goals: Poor ADL Goals Pt Will Perform Eating: with set-up;with supervision;sitting Pt Will Perform Grooming: with min assist;sitting Pt Will Perform Upper Body Bathing: with min assist;sitting Pt Will Perform Lower Body Bathing: with min assist;sit to/from stand Pt Will Perform Upper Body Dressing: with min assist;sitting Pt Will Perform Lower Body Dressing: with min assist;sit to/from stand Pt Will Transfer to Toilet: with min guard assist;ambulating;regular height toilet;grab bars Pt Will Perform Toileting - Clothing Manipulation and hygiene: with min guard assist;sit to/from stand  Plan Discharge plan remains appropriate;Frequency needs to be updated    Co-evaluation    PT/OT/SLP Co-Evaluation/Treatment: Yes Reason for Co-Treatment: Complexity of the patient's impairments (multi-system involvement);Necessary to address cognition/behavior during functional activity;For patient/therapist safety   OT goals addressed during session: ADL's and self-care      AM-PAC OT "6 Clicks" Daily Activity     Outcome Measure   Help from another person eating meals?: A Lot Help from another person taking care of personal grooming?: A Lot Help from another person toileting, which includes using toliet, bedpan, or urinal?: A Lot Help from  another person bathing (including washing, rinsing, drying)?: A Lot Help from another person to put on and taking off regular upper body clothing?: A Lot Help from another person to put on and taking off regular lower body clothing?: A Lot 6 Click Score: 12    End of Session    OT Visit Diagnosis: Unsteadiness on feet  (R26.81);Other abnormalities of gait and mobility (R26.89);Muscle weakness (generalized) (M62.81);Low vision, both eyes (H54.2);Other symptoms and signs involving cognitive function   Activity Tolerance Patient tolerated treatment well   Patient Left in bed;with call bell/phone within reach;with bed alarm set   Nurse Communication Mobility status        Time: 1834-3735 OT Time Calculation (min): 27 min  Charges: OT General Charges $OT Visit: 1 Visit OT Treatments $Self Care/Home Management : 8-22 mins  Layla Maw, OTR/L   Layla Maw 06/29/2020, 10:59 AM

## 2020-06-30 NOTE — TOC Progression Note (Addendum)
Transition of Care [facility]) - Progression Note    Patient Details  Name: Kenneth Sullivan MRN: [id number] Date of Birth: Mar 02, 1956  Transition of Care [facility]) CM/SW [place], RN Phone Number: [phone]  06/30/2020, 10:17 AM  Clinical Narrative:    Attempted to call niece [name] with no answer and a voicemail that has not been set up yet.  Niece [name] sent text to Cm stating that she was currently with a client and could not talk but asked could CM text her. CM sent text to niece back to make her aware that we have a bed offer from [facility] in [place] and that her uncle wishes to go there but wanted CM to speak with [name] first. Niece inquired about the location of the facility and CM made her aware that it is in [place] between [place] and [place]. Niece responded "farther. If he wants that, is this a good facility. Niece then thanked CM for all that is being done and states that she can call CM at 4 or 5pm. CM responded " Yes farther Im so sorry, Donnald Garre really tried hard to get him close to you but they wont give me a bed offer" Im here until 4:30. Call me when you can." Insurance Josem Kaufmann is still pending with medicaid.   Expected Discharge Plan: Skilled Nursing Facility Barriers to Discharge: No SNF bed  Expected Discharge Plan and Services Expected Discharge Plan: [place] In-house Referral: Clinical Social Work     Living arrangements for the past 2 months: Apartment                                       Social Determinants of Health (SDOH) Interventions    Readmission Risk Interventions No flowsheet data found.

## 2020-06-30 NOTE — Progress Notes (Signed)
[facility] PROGRESS NOTE  Kenneth Sullivan MGQ:[id number] DOB: February 27, 1956 DOA: 03/07/2020 PCP: [facility], [name]  Status: Inpatient--Remains inpatient appropriate because:Altered mental status, Unsafe d/c plan and Inpatient level of care appropriate due to severity of illness   Dispo: The patient is from: Home              Anticipated d/c is to: SNF with hospice              Anticipated d/c date is: >3 days              Patient currently is medically stable to d/c. -Barriers to discharge: [facility] provider attempting to contact niece to confirm that she will allow facility to accept patients disability check and payment for services to be provided at SNF-7/16 [facility] CM was able to reach niece by text message with request placed to recontact by telephone at around 4 or 5.  Insurance authorization is still pending with Medicaid   **Home hospice considered but niece (who would be primary caretaker) is unable to take the patient home as she is working multiple jobs.  She agrees with hospice care but placement and location of care remain difficult since patient has Medicaid.  Code Status: DNR Family Communication: Kenneth Sullivan niece updated 7/2.   7/16: TOC in discussions with niece in order to arrange for appropriate payments or so patient can discharge to SNF in [place] [facility] DVT prophylaxis: SCDs   HPI: 64 year old gentleman with no prior medical history initially presented to ED with shortness of breath and acute respiratory failure, had a witnessed bradycardic asystolic arrest.  He underwent normothermia protocol and was found to have massive PE and DVT with right ventricular strain.  Hospital course was initially complicated by upper GI bleed due to gastric ulcer.  CT abdomen pelvis showed a large antral ulcer, mass with multiple liver lesions and biopsy confirmed high-grade neuroendocrine tumor.  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  Patient  underwent IR guided mechanical thrombectomy for his massive PE and IVC filter.  Postprocedure while on heparin drip patient started having maroon-colored red stool and bloody drainage  from the OGT.  GI consulted and he underwent endoscopy showing gastric ulcer with blood clots.  IR consulted and he underwent GDA embolization.  Hospital course was further complicated by multiple acute to subacute ischemic infarcts involving bilateral cerebral hemispheres consistent with global hypoperfusion and also associated with scattered petechial hemorrhages with evidence of hemorrhagic conversion in the parietal lobe.  Oncology consulted and initially discussed with patient's adenocarcinoma diagnosis with the patient's niece over the phone recommended chemotherapy versus hospice.Palliative care consulted and niece agrees with hospice care.  Unfortunately she is now working multiple jobs unable to accommodate taking patient home for home hospice.  Placement has been difficult given patient having Medicare and lack of available beds.  Subjective: Alert.  Stated yesterday was his birthday and would like some chocolate cake. Much more verbally interactive today and although confused appropriate He is getting older and this is probably why he cannot remember everything all of the time.  Objective: Vitals:   06/29/20 0858 06/29/20 2100  BP: 126/81 129/86  Pulse: (!) 108 (!) 106  Resp: 18   Temp: 98.6 F (37 C) 99 F (37.2 C)  SpO2: 99% 100%    Intake/Output Summary (Last 24 hours) at 06/30/2020 1118 Last data filed at 06/30/2020 1100 Gross per 24 hour  Intake 60 ml  Output --  Net 60 ml  Filed Weights   05/31/20 0500 06/01/20 0254 06/11/20 0513  Weight: 63.5 kg 66.7 kg 80.7 kg    Exam: Constitutional: NAD, no acute distress, appears older than stated age ENMT: Mucous membranes are moist.Normal dentition.  Respiratory: clear to auscultation bilaterally, Normal respiratory effort.  Room  air-chronically coughs up thick yellow sputum Cardiovascular: Regular rate and rhythm, no murmurs / rubs / gallops. No extremity edema. 2+ pedal pulses.  Normal saline at 50 cc/h Abdomen: no tenderness, no masses palpated.  Bowel sounds positive.  Poor oral intake - improved if someone feeds patient Musculoskeletal: no clubbing / cyanosis. No joint deformity upper and lower extremities. no contractures.  Skin: no rashes, lesions, ulcers ie unremarkable Neurologic: CN 2-12 grossly intact. Sensation intact, DTR normal. Strength 4/5 x all 4 extremities.  Psychiatric: Alert, oriented to name only-affect more appropriate today.   Assessment/Plan: Acute hypoxic respiratory failure in the setting of massive PE/cardiac arrest Status post mechanical ventilation and tracheostomy.   Resolved Now decannulated and stable on room air.  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 Currently not on any anticoagulation due to GI bleed and CVA w/ hemorrhagic conversion.  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 Continue Prozac, Klonopin and Seroquel 100 mg am and 150 mg HS EKG from 7/6 with a corrected QTC of 450 ms based on RBB QRS= 122 ms and J-T= 328 ms.  Follow EKG as needed plan to discharge on current dosages of psychotropic medications.  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 Due to location of gastric mass it is not unexpected that patient unable to eat/has significant anorexia early satiety-recommendation is for easy to digest foods with focus on high-protein beverages and shakes  Sepsis 2/2 Streptococcus bacteremia Resolved Completed 10 days of Unasyn.  Patient remains afebrile but has mild tachycardia and leukocytosis probably secondary to malignancy and ongoing malnutrition  Constipation As needed Senokot.  Acute hypokalemia Potassium stable on replacement of 20 mEq daily with most recent reading 3.5 Magnesium 1.9  Debility/physical deconditioning 7/12: PT-session affected by patient's ongoing confusion.  He required max encouragement and coaxing but was able to participate.  Bed mobility needs assistance from supine to sit with moderate assistance +2 also +2 assist for transferring from bed to standing to use rolling walker, patient noted with poor initiation and effort and typically declines to walk preferring to get out of bed to chair only  Severe malnutrition probably secondary to adenocarcinoma, chronic illness multiple liver lesions, metastatic disease Nutrition Status: Nutrition Problem: Severe Malnutrition Etiology: chronic illness (gastric lesion, multiple liver lesions, concern for metastatic disease) Signs/Symptoms: severe fat depletion, severe muscle depletion Interventions: Ensure Enlive (each supplement provides 350kcal and 20 grams of protein), Magic cup Gastric mass extends from antrum into prepyloric area and into pylorus and probably in the Duodenal Bulb. I was not able to see the duodenal  bulb clearly.  Unlikely patient will be able to eat appropriately again given degree of cancer invasion and likely would benefit more from comfort feeds Continue easy to digest food and  high-protein beverages and shakes-patient with very minimal oral intake and only is remaining hydrated based on low flow IV fluids currently in place     Data Reviewed: Basic Metabolic Panel: Recent Labs  Lab 06/25/20 2334  NA 134*  K 3.9  CL 102  CO2 25  GLUCOSE 95  BUN <5*  CREATININE 0.55*  CALCIUM 8.3*  MG 1.7   Liver Function Tests: Recent Labs  Lab 06/25/20 2334  AST 17  ALT 9  ALKPHOS 74  BILITOT 0.6  PROT 5.7*  ALBUMIN 1.7*   No results for input(s): LIPASE, AMYLASE in the last 168 hours. No results for input(s): AMMONIA in the last 168 hours. CBC: No results for input(s): WBC, NEUTROABS, HGB, HCT, MCV, PLT in the last 168 hours. Cardiac Enzymes: No results for input(s): CKTOTAL, CKMB, CKMBINDEX, TROPONINI in the last 168 hours. BNP (last 3 results) Recent Labs    03/07/20 0710  BNP 74.6    ProBNP (last 3 results) No results for input(s): PROBNP in the last 8760 hours.  CBG: No results for input(s): GLUCAP in the last 168 hours.  No results found for this or any previous visit (from the past 240 hour(s)).   Studies: No results found.  Scheduled Meds: . clonazePAM  0.5 mg Oral BID  . feeding supplement (ENSURE ENLIVE)  237 mL Oral QID  . ferrous sulfate  325 mg Oral Q breakfast  . FLUoxetine  10 mg Oral QHS  . OXcarbazepine  75 mg Oral BID  . pantoprazole  40 mg Oral Daily  . polyethylene glycol  17 g Oral BID  . potassium chloride  20 mEq Oral Daily  . QUEtiapine  100 mg Oral Daily  . QUEtiapine  150 mg Oral QHS  . senna-docusate  1 tablet Oral BID   Continuous Infusions: . sodium chloride    . sodium chloride 50 mL/hr at 06/29/20 0902    Principal Problem:   Cardiac arrest [facility]) Active Problems:   Encounter for central line placement   GI bleed   Acute respiratory failure (HCC)   Anoxic encephalopathy (HCC)   Cerebral embolism with cerebral infarction   Pulmonary emboli ([place])   Palliative care by specialist   Goals of care,  counseling/discussion   DNR (do not resuscitate)   Status post tracheostomy ([place])   Protein-calorie malnutrition, severe   Neuroendocrine cancer ([place])   Consultants:  Palliative medicine team  Oncology  PCCM  Procedures:  Echocardiogram  EEG  EGD  Cortrack feeding tube  Antibiotics: Anti-infectives (From admission, onward)   Start     Dose/Rate Route Frequency Ordered Stop   06/02/20 1630  Ampicillin-Sulbactam (UNASYN) 3 g in sodium chloride 0.9 % 100 mL IVPB        3 g 200 mL/hr over 30 Minutes Intravenous Every 6 hours 06/02/20 1626 06/10/20 2215   06/02/20 0645  cefTRIAXone (ROCEPHIN) 2 g in sodium chloride 0.9 % 100 mL IVPB  Status:  Discontinued        2 g 200 mL/hr over 30 Minutes Intravenous Daily 06/02/20 0622 06/02/20 1607   06/01/20 0715  cefTRIAXone (ROCEPHIN) 1 g in sodium chloride 0.9 % 100 mL IVPB  Status:  Discontinued        1 g 200 mL/hr over 30 Minutes Intravenous Every 24 hours  06/01/20 0710 06/02/20 0622   03/26/20 1100  doxycycline (VIBRAMYCIN) 100 mg in sodium chloride 0.9 % 250 mL IVPB  Status:  Discontinued        100 mg 125 mL/hr over 120 Minutes Intravenous 2 times daily 03/26/20 1002 04/01/20 1035   03/25/20 1100  doxycycline (VIBRA-TABS) tablet 100 mg  Status:  Discontinued        100 mg Per Tube Every 12 hours 03/25/20 1009 03/26/20 1002   03/25/20 1015  doxycycline (VIBRAMYCIN) 100 mg in sodium chloride 0.9 % 250 mL IVPB  Status:  Discontinued       Note to Pharmacy: To be given after the tracheal aspirate is collected.   100 mg 125 mL/hr over 120 Minutes Intravenous Every 12 hours 03/25/20 1005 03/25/20 1008       Time spent: 15 minutes    Erin Hearing ANP [facility] Pager [phone]. If 7PM-7AM, please contact night-coverage at [email or web address] 06/30/2020, 11:18 AM  LOS: 115 days

## 2020-07-01 NOTE — Progress Notes (Addendum)
Order received for PIV placement. Patient has ativan and apresoline prn. Last Apresoline given 7/15 at 0430. Ativan route can be changed to P.O. Patient refusing PIV placement at this time. Primary RN notified of refusal.

## 2020-07-01 NOTE — Progress Notes (Signed)
Pt refuses new IV site MD notified.  Pt will be d/c to SNF on Monday per MD.

## 2020-07-01 NOTE — Progress Notes (Addendum)
[facility] PROGRESS NOTE  Kenneth Sullivan NTI:[id number] DOB: Feb 23, 1956 DOA: 03/07/2020 PCP: [facility], [name]  Status: Inpatient--Remains inpatient appropriate because:Altered mental status, Unsafe d/c plan and Inpatient level of care appropriate due to severity of illness   Dispo: The patient is from: Home              Anticipated d/c is to: SNF with hospice              Anticipated d/c date is: >3 days              Patient currently is medically stable to d/c. -Barriers to discharge: [facility] provider attempting to contact niece to confirm that she will allow facility to accept patients disability check and payment for services to be provided at SNF-7/16 [facility] CM was able to reach niece by text message with request placed to recontact by telephone at around 4 or 5.  Insurance authorization is still pending with Medicaid   **Home hospice considered but niece (who would be primary caretaker) is unable to take the patient home as she is working multiple jobs.  She agrees with hospice care but placement and location of care remain difficult since patient has Medicaid.  Code Status: DNR Family Communication: [name] niece updated 7/2.   7/16: TOC in discussions with niece in order to arrange for appropriate payments or so patient can discharge to SNF in [facility] DVT prophylaxis: SCDs   HPI: 64 year old gentleman with no prior medical history initially presented to ED with shortness of breath and acute respiratory failure, had a witnessed bradycardic asystolic arrest.  He underwent normothermia protocol and was found to have massive PE and DVT with right ventricular strain.  Hospital course was initially complicated by upper GI bleed due to gastric ulcer.  CT abdomen pelvis showed a large antral ulcer, mass with multiple liver lesions and biopsy confirmed high-grade neuroendocrine tumor.  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  Patient  underwent IR guided mechanical thrombectomy for his massive PE and IVC filter.  Postprocedure while on heparin drip patient started having maroon-colored red stool and bloody drainage  from the OGT.  GI consulted and he underwent endoscopy showing gastric ulcer with blood clots.  IR consulted and he underwent GDA embolization.  Hospital course was further complicated by multiple acute to subacute ischemic infarcts involving bilateral cerebral hemispheres consistent with global hypoperfusion and also associated with scattered petechial hemorrhages with evidence of hemorrhagic conversion in the parietal lobe.  Oncology consulted and initially discussed with patient's adenocarcinoma diagnosis with the patient's niece over the phone recommended chemotherapy versus hospice.Palliative care consulted and niece agrees with hospice care.  Unfortunately she is now working multiple jobs unable to accommodate taking patient home for home hospice.  Placement has been difficult given patient having Medicare and lack of available beds.  Subjective: Alert.  Patient does report is feeling little bit cold, asking if his room temperature can be increased which I have state, otherwise denies any complaints.  Objective: Vitals:   06/30/20 2007 07/01/20 0907  BP: 126/86 126/90  Pulse: (!) 105 100  Resp: 18 20  Temp: 98 F (36.7 C) 98.6 F (37 C)  SpO2: 96% 100%    Intake/Output Summary (Last 24 hours) at 07/01/2020 1225 Last data filed at 06/30/2020 2011 Gross per 24 hour  Intake --  Output 800 ml  Net -800 ml   Filed Weights   06/01/20 0254 06/11/20 0513 07/01/20 0500  Weight:  66.7 kg 80.7 kg 54 kg    Exam:   Constitutional: Patient in no apparent distress, frail, cachectic, awake and communicative ENMT: Mucous membranes are moist.Normal dentition.  Respiratory: clear to auscultation bilaterally, Normal respiratory effort.  Room air-chronically coughs up thick yellow sputum Cardiovascular: Rate and  rhythm, no rubs murmurs or gallops, no edema Abdomen: Abdomen soft, nontender, nondistended, bowel sounds present  Musculoskeletal: no clubbing / cyanosis. No joint deformity upper and lower extremities. no contractures.  Skin: no rashes, lesions, ulcers ie unremarkable Neurologic: CN 2-12 grossly intact. Sensation intact, DTR normal. Strength 4/5 x all 4 extremities.  Psychiatric: Alert, oriented to name only-affect more appropriate today.   Assessment/Plan: Acute hypoxic respiratory failure in the setting of massive PE/cardiac arrest Status post mechanical ventilation and tracheostomy.   Resolved Now decannulated and stable on room air.  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 Currently not on any anticoagulation due to GI bleed and CVA w/ hemorrhagic conversion.  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 Continue Prozac, Klonopin and Seroquel 100 mg am and 150 mg HS EKG from 7/6 with a corrected QTC of 450 ms based on RBB QRS= 122 ms and J-T= 328 ms.  Follow EKG as needed plan to discharge on current dosages of psychotropic medications.  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 Due to location of gastric  mass it is not unexpected that patient unable to eat/has significant anorexia early satiety-recommendation is for easy to digest foods with focus on high-protein beverages and shakes  Sepsis 2/2 Streptococcus bacteremia Resolved Completed 10 days of Unasyn.  Patient remains afebrile but has mild tachycardia and leukocytosis probably secondary to malignancy and ongoing malnutrition  Constipation As needed Senokot.  Acute hypokalemia Potassium stable on replacement of 20 mEq daily with most recent reading 3.5 Magnesium 1.9  Debility/physical deconditioning 7/12: PT-session affected by patient's ongoing confusion.  He required max encouragement and coaxing but was able to participate.  Bed mobility needs assistance from supine to sit with moderate assistance +2 also +2 assist for transferring from bed to standing to use rolling walker, patient noted with poor initiation and effort and typically declines to walk preferring to get out of bed to chair only  Severe malnutrition probably secondary to adenocarcinoma, chronic illness multiple liver lesions, metastatic disease Nutrition Status: Nutrition Problem: Severe Malnutrition Etiology: chronic illness (gastric lesion, multiple liver lesions, concern for metastatic disease) Signs/Symptoms: severe fat depletion, severe muscle depletion Interventions: Ensure Enlive (each supplement provides 350kcal and 20 grams of protein), Magic cup Gastric mass extends from antrum into prepyloric area and into pylorus and probably in the Duodenal Bulb. I was not able to see the duodenal bulb clearly.  Unlikely patient will be able to eat appropriately again given degree of cancer invasion and likely would benefit more from comfort feeds Continue easy to digest food and high-protein  beverages and shakes-patient with very minimal oral intake and only is remaining hydrated based on low flow IV fluids currently in place     Data Reviewed: Basic Metabolic  Panel: Recent Labs  Lab 06/25/20 2334  NA 134*  K 3.9  CL 102  CO2 25  GLUCOSE 95  BUN <5*  CREATININE 0.55*  CALCIUM 8.3*  MG 1.7   Liver Function Tests: Recent Labs  Lab 06/25/20 2334  AST 17  ALT 9  ALKPHOS 74  BILITOT 0.6  PROT 5.7*  ALBUMIN 1.7*   No results for input(s): LIPASE, AMYLASE in the last 168 hours. No results for input(s): AMMONIA in the last 168 hours. CBC: No results for input(s): WBC, NEUTROABS, HGB, HCT, MCV, PLT in the last 168 hours. Cardiac Enzymes: No results for input(s): CKTOTAL, CKMB, CKMBINDEX, TROPONINI in the last 168 hours. BNP (last 3 results) Recent Labs    03/07/20 0710  BNP 74.6    ProBNP (last 3 results) No results for input(s): PROBNP in the last 8760 hours.  CBG: No results for input(s): GLUCAP in the last 168 hours.  No results found for this or any previous visit (from the past 240 hour(s)).   Studies: No results found.  Scheduled Meds: . clonazePAM  0.5 mg Oral BID  . feeding supplement (ENSURE ENLIVE)  237 mL Oral QID  . ferrous sulfate  325 mg Oral Q breakfast  . FLUoxetine  10 mg Oral QHS  . OXcarbazepine  75 mg Oral BID  . pantoprazole  40 mg Oral Daily  . polyethylene glycol  17 g Oral BID  . potassium chloride  20 mEq Oral Daily  . QUEtiapine  100 mg Oral Daily  . QUEtiapine  150 mg Oral QHS  . senna-docusate  1 tablet Oral BID   Continuous Infusions: . sodium chloride    . sodium chloride 50 mL/hr at 06/29/20 0902    Principal Problem:   Cardiac arrest [facility]) Active Problems:   Encounter for central line placement   GI bleed   Acute respiratory failure (HCC)   Anoxic encephalopathy (HCC)   Cerebral embolism with cerebral infarction   Pulmonary emboli ([place])   Palliative care by specialist   Goals of care, counseling/discussion   DNR (do not resuscitate)   Status post tracheostomy ([place])   Protein-calorie malnutrition, severe   Neuroendocrine cancer ([place])   Consultants:  Palliative  medicine team  Oncology  PCCM  Procedures:  Echocardiogram  EEG  EGD  Cortrack feeding tube  Antibiotics: Anti-infectives (From admission, onward)   Start     Dose/Rate Route Frequency Ordered Stop   06/02/20 1630  Ampicillin-Sulbactam (UNASYN) 3 g in sodium chloride 0.9 % 100 mL IVPB        3 g 200 mL/hr over 30 Minutes Intravenous Every 6 hours 06/02/20 1626 06/10/20 2215   06/02/20 0645  cefTRIAXone (ROCEPHIN) 2 g in sodium chloride 0.9 % 100 mL IVPB  Status:  Discontinued        2 g 200 mL/hr over 30 Minutes Intravenous Daily 06/02/20 0622 06/02/20 1607   06/01/20 0715  cefTRIAXone (ROCEPHIN) 1 g in sodium chloride 0.9 % 100 mL IVPB  Status:  Discontinued        1 g 200 mL/hr over 30 Minutes Intravenous Every 24 hours 06/01/20 0710 06/02/20 0622   03/26/20 1100  doxycycline (VIBRAMYCIN) 100 mg in sodium chloride 0.9 % 250 mL IVPB  Status:  Discontinued  100 mg 125 mL/hr over 120 Minutes Intravenous 2 times daily 03/26/20 1002 04/01/20 1035   03/25/20 1100  doxycycline (VIBRA-TABS) tablet 100 mg  Status:  Discontinued        100 mg Per Tube Every 12 hours 03/25/20 1009 03/26/20 1002   03/25/20 1015  doxycycline (VIBRAMYCIN) 100 mg in sodium chloride 0.9 % 250 mL IVPB  Status:  Discontinued       Note to Pharmacy: To be given after the tracheal aspirate is collected.   100 mg 125 mL/hr over 120 Minutes Intravenous Every 12 hours 03/25/20 1005 03/25/20 1008       Time spent: 15 minutes    Phillips Climes MD [facility]. If 7PM-7AM, please contact night-coverage at [email or web address] 07/01/2020, 12:25 PM  LOS: 116 days

## 2020-07-01 NOTE — TOC Progression Note (Signed)
Transition of Care [facility]) - Progression Note    Patient Details  Name: Kenneth Sullivan MRN: [id number] Date of Birth: 1956/04/05  Transition of Care [facility]) CM/SW [place], [place] Phone Number: 07/01/2020, 1:49 PM  Clinical Narrative:    CSW spoke with Hinton Dyer from [facility] and informed insurance authorization has been received. CSW provided MD with an update and requested a covid test.   Patient is not vaccinated. [facility] would like to wait until Monday for discharge, once a quarantined bed is available. MD would like Outpatient Palliative to follow.  Expected Discharge Plan: Skilled Nursing Facility Barriers to Discharge: No SNF bed  Expected Discharge Plan and Services Expected Discharge Plan: [place] In-house Referral: Clinical Social Work     Living arrangements for the past 2 months: Apartment                                       Social Determinants of Health (SDOH) Interventions    Readmission Risk Interventions No flowsheet data found.

## 2020-07-02 NOTE — Progress Notes (Signed)
[facility] PROGRESS NOTE  Kenneth Sullivan VHQ:[id number] DOB: 1956/09/05 DOA: 03/07/2020 PCP: Medicine, [name]  Status: Inpatient--Remains inpatient appropriate because:Altered mental status, Unsafe d/c plan and Inpatient level of care appropriate due to severity of illness   Dispo: The patient is from: Home              Anticipated d/c is to: SNF with hospice              Anticipated d/c date is: >3 days              Patient currently is medically stable to d/c. -Barriers to discharge: [facility] provider attempting to contact niece to confirm that she will allow facility to accept patients disability check and payment for services to be provided at SNF-7/16 [facility] CM was able to reach niece by text message with request placed to recontact by telephone at around 4 or 5.  Insurance authorization is still pending with Medicaid   **Home hospice considered but niece (who would be primary caretaker) is unable to take the patient home as she is working multiple jobs.  She agrees with hospice care but placement and location of care remain difficult since patient has Medicaid.  Code Status: DNR Family Communication: None at bedside Disposition plan: Discussed with social worker, patient had approved insurance authorization, SNF will be able to accept tomorrow DVT prophylaxis: SCDs   HPI: 64 year old gentleman with no prior medical history initially presented to ED with shortness of breath and acute respiratory failure, had a witnessed bradycardic asystolic arrest.  He underwent normothermia protocol and was found to have massive PE and DVT with right ventricular strain.  Hospital course was initially complicated by upper GI bleed due to gastric ulcer.  CT abdomen pelvis showed a large antral ulcer, mass with multiple liver lesions and biopsy confirmed high-grade neuroendocrine tumor.  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  Patient underwent IR guided mechanical  thrombectomy for his massive PE and IVC filter.  Postprocedure while on heparin drip patient started having maroon-colored red stool and bloody drainage  from the OGT.  GI consulted and he underwent endoscopy showing gastric ulcer with blood clots.  IR consulted and he underwent GDA embolization.  Hospital course was further complicated by multiple acute to subacute ischemic infarcts involving bilateral cerebral hemispheres consistent with global hypoperfusion and also associated with scattered petechial hemorrhages with evidence of hemorrhagic conversion in the parietal lobe.  Oncology consulted and initially discussed with patient's adenocarcinoma diagnosis with the patient's niece over the phone recommended chemotherapy versus hospice.Palliative care consulted and niece agrees with hospice care.  Unfortunately she is now working multiple jobs unable to accommodate taking patient home for home hospice.  Placement has been difficult given patient having Medicare and lack of available beds.  Subjective: Alert.  He denies any complaints today, no significant events as discussed with staff .  Objective: Vitals:   07/01/20 2300 07/02/20 0757  BP: 123/85 (!) 130/91  Pulse: (!) 103 (!) 105  Resp: 14 16  Temp: 97.9 F (36.6 C) 98.4 F (36.9 C)  SpO2: 99% 97%    Intake/Output Summary (Last 24 hours) at 07/02/2020 1353 Last data filed at 07/01/2020 2300 Gross per 24 hour  Intake --  Output 700 ml  Net -700 ml   Filed Weights   06/01/20 0254 06/11/20 0513 07/01/20 0500  Weight: 66.7 kg 80.7 kg 54 kg    Exam:   Constitutional: Patient in no apparent distress, frail,  cachectic, awake and communicative patient in no apparent distress, frail, cachectic, awake and communicative Respiratory: Clear to auscultation bilaterally, no wheezing or rhonchi Cardiovascular: Rate and rhythm, no rubs murmurs or gallops, no edema Abdomen: Abdomen soft, nontender, nondistended, bowel sounds present   Musculoskeletal: no clubbing / cyanosis. No joint deformity upper and lower extremities. no contractures.  Neurologic: CN 2-12 grossly intact. Sensation intact, DTR normal. Strength 4/5 x all 4 extremities.  Psychiatric: Alert, oriented to name only-affect more appropriate today.   Assessment/Plan:  Acute hypoxic respiratory failure in the setting of massive PE/cardiac arrest Status post mechanical ventilation and tracheostomy.   Resolved Now decannulated and stable on room air.  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 Currently not on any anticoagulation due to GI bleed and CVA w/ hemorrhagic conversion.  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 Continue Prozac, Klonopin and Seroquel 100 mg am and 150 mg HS EKG from 7/6 with a corrected QTC of 450 ms based on RBB QRS= 122 ms and J-T= 328 ms.  Follow EKG as needed plan to discharge on current dosages of psychotropic medications.  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 Due to location of gastric mass it is not unexpected that patient unable to eat/has significant anorexia early satiety-recommendation is for easy to digest foods with focus on high-protein  beverages and shakes  Sepsis 2/2 Streptococcus bacteremia Resolved Completed 10 days of Unasyn.  Patient remains afebrile but has mild tachycardia and leukocytosis probably secondary to malignancy and ongoing malnutrition  Constipation As needed Senokot.  Acute hypokalemia Potassium stable on replacement of 20 mEq daily with most recent reading 3.5 Magnesium 1.9  Debility/physical deconditioning 7/12: PT-session affected by patient's ongoing confusion.  He required max encouragement and coaxing but was able to participate.  Bed mobility needs assistance from supine to sit with moderate assistance +2 also +2 assist for transferring from bed to standing to use rolling walker, patient noted with poor initiation and effort and typically declines to walk preferring to get out of bed to chair only  Severe malnutrition probably secondary to adenocarcinoma, chronic illness multiple liver lesions, metastatic disease Nutrition Status: Nutrition Problem: Severe Malnutrition Etiology: chronic illness (gastric lesion, multiple liver lesions, concern for metastatic disease) Signs/Symptoms: severe fat depletion, severe muscle depletion Interventions: Ensure Enlive (each supplement provides 350kcal and 20 grams of protein), Magic cup Gastric mass extends from antrum into prepyloric area and into pylorus and probably in the Duodenal Bulb. I was not able to see the duodenal bulb clearly.  Unlikely patient will be able to eat appropriately again given degree of cancer invasion and likely would benefit more from comfort feeds Continue easy to digest food and high-protein beverages and shakes-patient with very minimal oral intake and only is remaining hydrated based on low flow IV fluids currently in place     Data Reviewed: Basic  Metabolic Panel: Recent Labs  Lab 06/25/20 2334  NA 134*  K 3.9  CL 102  CO2 25  GLUCOSE 95  BUN <5*  CREATININE 0.55*  CALCIUM 8.3*  MG 1.7   Liver Function  Tests: Recent Labs  Lab 06/25/20 2334  AST 17  ALT 9  ALKPHOS 74  BILITOT 0.6  PROT 5.7*  ALBUMIN 1.7*   No results for input(s): LIPASE, AMYLASE in the last 168 hours. No results for input(s): AMMONIA in the last 168 hours. CBC: No results for input(s): WBC, NEUTROABS, HGB, HCT, MCV, PLT in the last 168 hours. Cardiac Enzymes: No results for input(s): CKTOTAL, CKMB, CKMBINDEX, TROPONINI in the last 168 hours. BNP (last 3 results) Recent Labs    03/07/20 0710  BNP 74.6    ProBNP (last 3 results) No results for input(s): PROBNP in the last 8760 hours.  CBG: No results for input(s): GLUCAP in the last 168 hours.  No results found for this or any previous visit (from the past 240 hour(s)).   Studies: No results found.  Scheduled Meds: . clonazePAM  0.5 mg Oral BID  . feeding supplement (ENSURE ENLIVE)  237 mL Oral QID  . ferrous sulfate  325 mg Oral Q breakfast  . FLUoxetine  10 mg Oral QHS  . OXcarbazepine  75 mg Oral BID  . pantoprazole  40 mg Oral Daily  . polyethylene glycol  17 g Oral BID  . potassium chloride  20 mEq Oral Daily  . QUEtiapine  100 mg Oral Daily  . QUEtiapine  150 mg Oral QHS  . senna-docusate  1 tablet Oral BID   Continuous Infusions: . sodium chloride    . sodium chloride 50 mL/hr at 06/29/20 0902    Principal Problem:   Cardiac arrest [facility]) Active Problems:   Encounter for central line placement   GI bleed   Acute respiratory failure (HCC)   Anoxic encephalopathy (HCC)   Cerebral embolism with cerebral infarction   Pulmonary emboli ([place])   Palliative care by specialist   Goals of care, counseling/discussion   DNR (do not resuscitate)   Status post tracheostomy ([place])   Protein-calorie malnutrition, severe   Neuroendocrine cancer ([place])   Consultants:  Palliative medicine team  Oncology  PCCM  Procedures:  Echocardiogram  EEG  EGD  Cortrack feeding tube  Antibiotics: Anti-infectives (From admission, onward)    Start     Dose/Rate Route Frequency Ordered Stop   06/02/20 1630  Ampicillin-Sulbactam (UNASYN) 3 g in sodium chloride 0.9 % 100 mL IVPB        3 g 200 mL/hr over 30 Minutes Intravenous Every 6 hours 06/02/20 1626 06/10/20 2215   06/02/20 0645  cefTRIAXone (ROCEPHIN) 2 g in sodium chloride 0.9 % 100 mL IVPB  Status:  Discontinued        2 g 200 mL/hr over 30 Minutes Intravenous Daily 06/02/20 0622 06/02/20 1607   06/01/20 0715  cefTRIAXone (ROCEPHIN) 1 g in sodium chloride 0.9 % 100 mL IVPB  Status:  Discontinued        1 g 200 mL/hr over 30 Minutes Intravenous Every 24 hours 06/01/20 0710 06/02/20 0622   03/26/20 1100  doxycycline (VIBRAMYCIN) 100 mg in sodium chloride 0.9 % 250 mL IVPB  Status:  Discontinued        100 mg 125 mL/hr over 120 Minutes Intravenous 2 times daily 03/26/20 1002 04/01/20 1035   03/25/20 1100  doxycycline (VIBRA-TABS) tablet 100 mg  Status:  Discontinued  100 mg Per Tube Every 12 hours 03/25/20 1009 03/26/20 1002   03/25/20 1015  doxycycline (VIBRAMYCIN) 100 mg in sodium chloride 0.9 % 250 mL IVPB  Status:  Discontinued       Note to Pharmacy: To be given after the tracheal aspirate is collected.   100 mg 125 mL/hr over 120 Minutes Intravenous Every 12 hours 03/25/20 1005 03/25/20 [place] Tanvi Gatling MD [facility]. If 7PM-7AM, please contact night-coverage at [email or web address] 07/02/2020, 1:53 PM  LOS: 117 days

## 2020-07-03 LAB — COMPREHENSIVE METABOLIC PANEL
ALT: 9 U/L (ref 0–44)
AST: 16 U/L (ref 15–41)
Albumin: 2 g/dL — ABNORMAL LOW (ref 3.5–5.0)
Alkaline Phosphatase: 84 U/L (ref 38–126)
Anion gap: 10 (ref 5–15)
BUN: 7 mg/dL — ABNORMAL LOW (ref 8–23)
CO2: 24 mmol/L (ref 22–32)
Calcium: 8.5 mg/dL — ABNORMAL LOW (ref 8.9–10.3)
Chloride: 96 mmol/L — ABNORMAL LOW (ref 98–111)
Creatinine, Ser: 0.64 mg/dL (ref 0.61–1.24)
GFR calc Af Amer: >60 mL/min (ref >60)
GFR calc non Af Amer: >60 mL/min (ref >60)
Glucose, Bld: 89 mg/dL (ref 70–99)
Potassium: 3.8 mmol/L (ref 3.5–5.1)
Sodium: 130 mmol/L — ABNORMAL LOW (ref 135–145)
Total Bilirubin: 0.9 mg/dL (ref 0.3–1.2)
Total Protein: 6.1 g/dL — ABNORMAL LOW (ref 6.5–8.1)

## 2020-07-03 LAB — SARS CORONAVIRUS 2 BY RT PCR (HOSPITAL ORDER, PERFORMED IN ~~LOC~~ HOSPITAL LAB): SARS Coronavirus 2: NEGATIVE

## 2020-07-03 LAB — MAGNESIUM: Magnesium: 1.7 mg/dL (ref 1.7–2.4)

## 2020-07-03 MED ORDER — IPRATROPIUM-ALBUTEROL 0.5-2.5 (3) MG/3ML IN SOLN: 3.0000 mL | Freq: Four times a day (QID) | RESPIRATORY_TRACT | Status: AC | PRN

## 2020-07-03 MED ORDER — CLONAZEPAM 0.5 MG PO TABS: 0.5000 mg | ORAL_TABLET | Freq: Two times a day (BID) | ORAL | 0 refills | Status: AC

## 2020-07-03 MED ORDER — ACETAMINOPHEN 325 MG PO TABS: 650.0000 mg | ORAL_TABLET | Freq: Four times a day (QID) | ORAL | Status: AC | PRN

## 2020-07-03 MED ORDER — GUAIFENESIN-DM 100-10 MG/5ML PO SYRP: 10.0000 mL | ORAL_SOLUTION | ORAL | 0 refills | Status: AC | PRN

## 2020-07-03 MED ORDER — LORAZEPAM 0.5 MG PO TABS: 0.5000 mg | ORAL_TABLET | ORAL | 0 refills | Status: AC | PRN

## 2020-07-03 MED ORDER — PANTOPRAZOLE SODIUM 40 MG PO TBEC: 40.0000 mg | DELAYED_RELEASE_TABLET | Freq: Every day | ORAL | Status: AC

## 2020-07-03 MED ORDER — DOCUSATE SODIUM 100 MG PO CAPS: 100.0000 mg | ORAL_CAPSULE | Freq: Two times a day (BID) | ORAL | 0 refills | Status: AC | PRN

## 2020-07-03 MED ORDER — FLUOXETINE HCL 10 MG PO CAPS: 10.0000 mg | ORAL_CAPSULE | Freq: Every day | ORAL | 3 refills | Status: AC

## 2020-07-03 MED ORDER — QUETIAPINE FUMARATE 100 MG PO TABS: 100.0000 mg | ORAL_TABLET | Freq: Every day | ORAL | 0 refills | Status: AC

## 2020-07-03 MED ORDER — POTASSIUM CHLORIDE CRYS ER 20 MEQ PO TBCR: 20.0000 meq | EXTENDED_RELEASE_TABLET | Freq: Every day | ORAL | Status: AC

## 2020-07-03 MED ORDER — POLYETHYLENE GLYCOL 3350 17 G PO PACK: 17.0000 g | PACK | Freq: Two times a day (BID) | ORAL | 0 refills | Status: AC

## 2020-07-03 MED ORDER — FERROUS SULFATE 325 (65 FE) MG PO TABS: 325.0000 mg | ORAL_TABLET | Freq: Every day | ORAL | 3 refills | Status: AC

## 2020-07-03 MED ORDER — ENSURE ENLIVE PO LIQD: 237.0000 mL | Freq: Four times a day (QID) | ORAL | 12 refills | Status: AC

## 2020-07-03 MED ORDER — OXYCODONE HCL 5 MG PO TABS: 5.0000 mg | ORAL_TABLET | Freq: Four times a day (QID) | ORAL | 0 refills | Status: AC | PRN

## 2020-07-03 MED ORDER — SENNOSIDES-DOCUSATE SODIUM 8.6-50 MG PO TABS: 1.0000 | ORAL_TABLET | Freq: Two times a day (BID) | ORAL | Status: AC

## 2020-07-03 MED ORDER — OXCARBAZEPINE 150 MG PO TABS: 75.0000 mg | ORAL_TABLET | Freq: Two times a day (BID) | ORAL | 0 refills | Status: AC

## 2020-07-03 MED ORDER — QUETIAPINE FUMARATE 50 MG PO TABS: 150.0000 mg | ORAL_TABLET | Freq: Every day | ORAL | 0 refills | Status: AC

## 2020-07-03 NOTE — Progress Notes (Signed)
RN gave report to Engineer, mining at [facility].

## 2020-07-03 NOTE — Discharge Summary (Signed)
Physician Discharge Summary  Kenneth Sullivan VCB:[id number] DOB: 1956-07-04 DOA: 03/07/2020  PCP: [name]  Admit date: 03/07/2020 Discharge date: 07/03/2020  Time spent: >30 minutes  Recommendations for Outpatient Follow-up:  1. Patient has metastatic gastric cancer and is not a candidate for chemotherapy.  Recommendation is for outpatient palliative services.  Patient's family was amenable to home hospice but they were unable to provide home care 2. 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 He underwent normothermia protocol and was found to have massive PE and DVT with right ventricular strain.  Hospital course was initially complicated by upper GI bleed due to gastric ulcer. CT abdomen pelvis showed a large antral ulcer, mass with multiple liver lesions and biopsy confirmed high-grade neuroendocrine tumor.  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 Patient underwent IR guided  mechanical thrombectomy for his massive PE and IVC filter. Postprocedure while on heparin drip patient started having maroon-colored red stool and bloody drainage from the OGT. GI consulted and he underwent endoscopy showing gastric ulcer with blood clots. IR was consulted and he underwent GDA embolization.  Hospital course was further complicated by multiple acute to subacute ischemic infarcts involving bilateral cerebral hemispheres consistent with global hypoperfusion and also associated with scattered petechial hemorrhages with evidence of hemorrhagic conversion in the parietal lobe.  Oncology consulted and initially discussed with patient's niece (by phone) regarding his adenocarcinoma/high-grade neuroendocrine tumor with liver metastasis diagnosis as well as recommendation from oncology team that patient is not a candidate for chemotherapy.  Chemotherapy. Palliative care consulted and niece agreed with hospice care.  Unfortunately she is now working multiple jobs unable to accommodate taking patient home for home hospice.  At time of discharge patient has been tolerating a mechanical soft diet with thin liquids.  His behavior has been well controlled with combination of psychotropic occasions as well as benzodiazepines.  Patient does better with oral intake when assisted with diet.  Hospital Course:  Acute hypoxic respiratory failure in the setting of massive PE/cardiac arrest Status post mechanical ventilation and tracheostomy.  Resolved Now decannulated and stable on room air.  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 Currently not on any anticoagulation due to GI bleed and CVA w/ hemorrhagic conversion.  Anoxic encephalopathy/brain injury secondary to cardiac arrest Unable to safely return home and live independently; remains confused with lack of insight into current medical condition and demonstrated  recurrent short-term memory deficits; overall behavioral issues related  to brain injury improved with adjustments in psychotropic medications as below. Continue Prozac, Klonopin and Seroquel 100 mg am and 150 mg HS EKG from 7/6 with a corrected QTC of 450 ms based on RBB QRS= 122 ms and J-T= 328 ms.  Follow EKG as needed plan to discharge on current dosages of psychotropic medications.  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 S/p Embolization of the gastroduodenal artery by IR Oncology consulted and determined patient not an appropriate candidate for chemotherapy due to current medical comorbidities-they have signed off Palliative care has confirmed with niece that hospice care requested Continue PPI. Due to location of gastric mass it is not unexpected that patient unable to eat/has significant anorexia early satiety-recommendation is for easy to digest foods with focus on high-protein beverages and shakes  Sepsis 2/2 Streptococcus bacteremia Resolved Completed 10 days of Unasyn.  Patient remains afebrile but has mild tachycardia and leukocytosis probably secondary to malignancy and ongoing malnutrition  Constipation As needed Senokot and MiraLAX  Acute hypokalemia Potassium stable on replacement of 20 mEq daily with most recent reading 3.8 Magnesium 1.7  Debility/physical deconditioning 7/12: PT-session affected by patient's ongoing confusion.  He required max encouragement and coaxing but was able to participate.  Bed mobility needs assistance from supine to sit with moderate assistance +2 also +2 assist for transferring from bed to standing to use rolling walker,  patient noted with poor initiation and effort and typically declines to walk preferring to get out of bed to chair only  Severe malnutrition probably secondary to adenocarcinoma, chronic illness multiple liver lesions, metastatic disease Nutrition Status: Nutrition Problem: Severe Malnutrition Etiology: chronic illness (gastric lesion, multiple liver lesions, concern for metastatic disease) Signs/Symptoms: severe fat depletion, severe muscle depletion Interventions: Ensure Enlive (each supplement provides 350kcal and 20 grams of protein), Magic cup Gastric mass extends from antrum into prepyloric area and into pylorus and probably in the Duodenal Bulb. I was not able to see the duodenal bulb clearly.  Unlikely patient will be able to eat appropriately again given degree of cancer invasion and likely would benefit more from comfort feeds Continue easy to digest food and high-protein beverages and shakes-patient with very minimal oral intake and only is remaining hydrated based on low flow IV fluids currently in place  Procedures:  Echocardiogram  EEG  EGD  Cortrack feeding tube  Consultations:  Palliative medicine team  Oncology  PCCM   Discharge Exam: Vitals:   07/02/20 2238 07/03/20 0759  BP: 134/87 (!) 134/97  Pulse: (!) 105 (!) 101  Resp: 16   Temp: 97.9 F (36.6 C) 98.2 F (36.8 C)  SpO2: 96% 99%   Constitutional: NAD, no acute distress, appears older than stated age Respiratory: clear to auscultation bilaterally, Normal respiratory effort.  Room air-chronically coughs up thick yellow sputum Cardiovascular: Regular rate and rhythm, no murmurs / rubs / gallops. No extremity edema. 2+ pedal pulses.   Abdomen: no tenderness, no masses palpated.  Bowel sounds positive.  Poor oral intake - improved if someone feeds patient Musculoskeletal: no clubbing / cyanosis. No joint deformity upper and lower extremities. no contractures.  Skin: no rashes, lesions, ulcers ie  unremarkable Neurologic: CN 2-12 grossly intact. Sensation intact, DTR normal. Strength 4/5 x all 4 extremities.  Psychiatric: Alert, oriented to name only-affect more appropriate today.  Discharge Instructions   Discharge Instructions    Diet - low sodium heart healthy   Complete by: As directed    Diet general   Complete by: As directed    Mechanical soft with thin liquids Recommend the addition of Ensure beverage 3 times daily with meals or in between meals and at bedtime   Discharge instructions   Complete by: As directed    Recommend outpatient referral to palliative team for continued hospice care after transition to skilled nursing facility   Increase activity slowly   Complete by: As directed    No wound care   Complete by: As directed      Allergies as of 07/03/2020   No Known Allergies     Medication List    TAKE these medications   acetaminophen 325 MG tablet Commonly known as: TYLENOL Take 2 tablets (650 mg total) by mouth every 6 (six) hours as needed for mild pain, fever or headache.   clonazePAM 0.5 MG tablet Commonly known as: KLONOPIN Take 1 tablet (0.5 mg total) by mouth 2 (two) times daily.   docusate sodium 100 MG capsule Commonly known as: COLACE Take 1 capsule (100 mg total) by mouth 2 (two) times daily as needed for mild constipation.   feeding supplement (ENSURE ENLIVE) Liqd Take 237 mLs by mouth 4 (four) times daily.   ferrous sulfate 325 (65 FE) MG tablet Take 1 tablet (325 mg total) by mouth daily with breakfast.   FLUoxetine 10 MG capsule Commonly known as: PROZAC Take 1 capsule (10 mg total) by mouth at bedtime.   guaiFENesin-dextromethorphan 100-10 MG/5ML syrup Commonly known as: ROBITUSSIN DM Take 10 mLs by mouth every 4 (four) hours as needed for cough.   ipratropium-albuterol 0.5-2.5 (3) MG/3ML Soln Commonly known as: DUONEB Take 3 mLs by nebulization every 6 (six) hours as needed.   LORazepam 0.5 MG tablet Commonly known  as: Ativan Take 1 tablet (0.5 mg total) by mouth every 4 (four) hours as needed for up to 20 doses for anxiety.   OXcarbazepine 150 MG tablet Commonly known as: TRILEPTAL Take 0.5 tablets (75 mg total) by mouth 2 (two) times daily.   oxyCODONE 5 MG immediate release tablet Commonly known as: Oxy IR/ROXICODONE Take 1 tablet (5 mg total) by mouth every 6 (six) hours as needed for severe pain or breakthrough pain.   pantoprazole 40 MG tablet Commonly known as: PROTONIX Take 1 tablet (40 mg total) by mouth daily.   polyethylene glycol 17 g packet Commonly known as: MIRALAX / GLYCOLAX Take 17 g by mouth 2 (two) times daily.   potassium chloride SA 20 MEQ tablet Commonly known as: KLOR-CON Take 1 tablet (20 mEq total) by mouth daily.   QUEtiapine 100 MG tablet Commonly known as: SEROQUEL Take 1 tablet (100 mg total) by mouth daily.   QUEtiapine 50 MG tablet Commonly known as: SEROQUEL Take 3 tablets (150 mg total) by mouth at bedtime.   senna-docusate 8.6-50 MG tablet Commonly known as: Senokot-S Take 1 tablet by mouth 2 (two) times daily.      No Known Allergies    The results of significant diagnostics from this hospitalization (including imaging, microbiology, ancillary and laboratory) are listed below for reference.    Significant Diagnostic Studies: CT ABDOMEN PELVIS W CONTRAST  Result Date: 06/04/2020 CLINICAL DATA:  Abdominal pain. Fever of unknown origin. Upper GI bleed secondary to large malignant gastric ulcer with acute  blood loss anemia. Bloody stool last night. EXAM: CT ABDOMEN AND PELVIS WITH CONTRAST TECHNIQUE: Multidetector CT imaging of the abdomen and pelvis was performed using the standard protocol following bolus administration of intravenous contrast. CONTRAST:  130m OMNIPAQUE IOHEXOL 300 MG/ML  SOLN COMPARISON:  Abdominal CT 05/01/2020 FINDINGS: Lower chest: Small right pleural effusion which has slightly increased from CT last month. Trace left pleural  effusion which is new. Scattered basilar atelectasis. Heart is normal in size. Hepatobiliary: Increased size of dominant left lobe liver lesion since last month, currently 8 cm, previously 5.4 cm. Subcapsular right lobe lesion is also increased in size currently 4.5 cm, previously 2.4 cm. Smaller subcapsular lesion in the right lobe is also slightly increased in size. There are new lesions in the central right lobe, series 3, image 11, and possibly left lobe series 3, image 26. Gallbladder physiologically distended. No biliary dilatation. Pancreas: No ductal dilatation or inflammation. Spleen: Normal in size without focal abnormality. Adrenals/Urinary Tract: Normal adrenal glands. No hydronephrosis or perinephric edema. Homogeneous renal enhancement with symmetric excretion on delayed phase imaging. Left renal cyst unchanged from prior exam. Urinary bladder is partially distended without wall thickening. Stomach/Bowel: The stomach is nondistended. The known gastric mass is difficult to delineate by CT, but roughly in the region of series 3, image 24. There is no obvious gastric wall thickening. No small bowel obstruction. Administered enteric contrast reaches the colon. No small bowel inflammation. Moderate colonic stool burden without colonic wall thickening. There is sigmoid colonic tortuosity. Vascular/Lymphatic: Infrarenal IVC filter in place. Aortic atherosclerosis without aneurysm. The portal vein is patent. Embolization coils in the upper abdomen. No bulky abdominopelvic adenopathy. Reproductive: Prostate is unremarkable. Other: Trace loculated subcapsular fluid adjacent to the left lobe of the liver. No other ascites. Trace presacral edema. Generalized body wall edema in the flanks. No free intra-abdominal air or abscess. Musculoskeletal: Mild chronic L2 superior endplate compression fracture, unchanged. No acute osseous abnormality or focal bone lesion. IMPRESSION: 1. Increased size of hepatic metastasis  since CT last month. There is also a new small liver lesion. 2. The known gastric mass is difficult to delineate on the current exam given nondistended stomach. 3. Small right and trace left pleural effusions, slightly increased from CT last month. 4. Moderate colonic stool burden with sigmoid colonic tortuosity, can be seen with constipation. No bowel obstruction or inflammation. Aortic Atherosclerosis (ICD10-I70.0). Electronically Signed   By: MKeith RakeM.D.   On: 06/04/2020 00:50    Microbiology: No results found for this or any previous visit (from the past 240 hour(s)).   Labs: Basic Metabolic Panel: Recent Labs  Lab 07/03/20 0045  NA 130*  K 3.8  CL 96*  CO2 24  GLUCOSE 89  BUN 7*  CREATININE 0.64  CALCIUM 8.5*  MG 1.7   Liver Function Tests: Recent Labs  Lab 07/03/20 0045  AST 16  ALT 9  ALKPHOS 84  BILITOT 0.9  PROT 6.1*  ALBUMIN 2.0*   No results for input(s): LIPASE, AMYLASE in the last 168 hours. No results for input(s): AMMONIA in the last 168 hours. CBC: No results for input(s): WBC, NEUTROABS, HGB, HCT, MCV, PLT in the last 168 hours. Cardiac Enzymes: No results for input(s): CKTOTAL, CKMB, CKMBINDEX, TROPONINI in the last 168 hours. BNP: BNP (last 3 results) Recent Labs    03/07/20 0710  BNP 74.6    ProBNP (last 3 results) No results for input(s): PROBNP in the last 8760 hours.  CBG: No results  for input(s): GLUCAP in the last 168 hours.     Signed:  Erin Hearing ANP  [facility] 07/03/2020, 11:19 AM

## 2020-07-03 NOTE — TOC Progression Note (Signed)
Transition of Care [facility]) - Progression Note    Patient Details  Name: Kenneth Sullivan MRN: [id number] Date of Birth: [birth year]  Transition [facility]) CM/SW [place], RN Phone Number: [phone]  07/03/2020, 12:47 PM  Clinical Narrative: Damaris Schooner with Hinton Dyer at [facility] in [place] to make her aware that patient is ready for discharge. Covid test is back and negative. D/c summary, H&P, current covid results and tx report all sent to [facility] center via the hub per Dana's request. Hinton Dyer is currently out of facility and will be returning shortly. She will call CM with room# and details to call report.   Expected Discharge Plan: Skilled Nursing Facility Barriers to Discharge: No SNF bed  Expected Discharge Plan and Services Expected Discharge Plan: [place] In-house Referral: Clinical Social Work     Living arrangements for the past 2 months: Apartment Expected Discharge Date: 07/03/20                                     Social Determinants of Health (SDOH) Interventions    Readmission Risk Interventions No flowsheet data found.

## 2020-07-03 NOTE — Plan of Care (Signed)
Pt has met care plan goals. He is adequate discharge.

## 2020-07-03 NOTE — TOC Transition Note (Signed)
Transition of Care [facility]) - CM/SW Discharge Note   Patient Details  Name: Zack Crager MRN: [id number] Date of Birth: 02-Sep-1956  Transition of Care [facility]) CM/SW Contact:  Angelita Ingles, RN Phone Number: [phone]  07/03/2020, 9:52 AM   Clinical Narrative:   CM attempted to call niece [name]. Call goes to voicemail that has not been set up.CM sent niece message to call CM when she is available to talk. Will await return call. Covid test needs to be collected. Message sent to primary nurse.    Final next level of care: Skilled Nursing Facility Barriers to Discharge: No SNF bed   Patient Goals and CMS Choice   CMS Medicare.gov Compare Post Acute Care list provided to:: Patient Represenative (must comment) [name]) Choice offered to / list presented to :  (Niece)  Discharge Placement                       Discharge Plan and Services In-house Referral: Clinical Social Work                                   Social Determinants of Health (SDOH) Interventions     Readmission Risk Interventions No flowsheet data found.

## 2020-07-03 NOTE — TOC Transition Note (Signed)
Transition of Care [facility]) - CM/SW Discharge Note   Patient Details  Name: Kenneth Sullivan MRN: [id number] Date of Birth: [birth year]  Transition of Care [facility]) CM/SW Contact:  Angelita Ingles, RN Phone Number: [phone]  07/03/2020, 3:14 PM   Clinical Narrative:  Patient to discharge to [facility] in [place]. Bed availability verified per Hinton Dyer. Attempted to call  Niece [name] to make her aware. No answer and voicemail that has not been set up. Erin Hearing NP spoke with niece [name] and made her aware of the discharge. Patient is aware and  states that he is ready to go. Bedside nurse made aware.Transportation has been set up via [employer]. CM will sign off  Please call report to [place] Room # 520 Bed 1  Ask for B hall nurse.  Final next level of care: Skilled Nursing Facility Barriers to Discharge: No Barriers Identified   Patient Goals and CMS Choice   CMS Medicare.gov Compare Post Acute Care list provided to:: Patient Represenative (must comment) (niece [name]) Choice offered to / list presented to :  (niece [name])  Discharge Placement              Patient chooses bed at: [facility] Patient to be transferred to facility by: [place] Name of family member notified: Niece [name] notified by Erin Hearing NP Patient and family notified of of transfer: 07/03/20  Discharge Plan and Services In-house Referral: Clinical Social Work              DME Arranged: N/A DME Agency: NA       HH Arranged: NA HH Agency: NA        Social Determinants of Health (SDOH) Interventions     Readmission Risk Interventions No flowsheet data found.

## 2020-07-31 DIAGNOSIS — Z7189 Other specified counseling: Secondary | ICD-10-CM

## 2020-08-16 DEATH — deceased

## 2021-01-05 IMAGING — CT CT ABDOMEN W/O CM
2 of 4 series · 15 of 46 positions shown, 17 images · non-contrast
Comparison: Chest CT-03/07/2020

CLINICAL DATA: Evaluate anatomy prior to potential percutaneous
gastrostomy tube placement. History of GI bleeding requiring
mesenteric arteriogram and embolization.

EXAM:
CT ABDOMEN WITHOUT CONTRAST
TECHNIQUE: Multidetector CT imaging of the abdomen was performed following the
standard protocol without IV contrast.

[Series 3: abd/ pelvis 5.0 i30f 2 · axial · 0.80mm/px · z∈[+1126,+1336]mm · 12 of 48 slices shown, 14 images]
[im 3/48  soft-tissue]
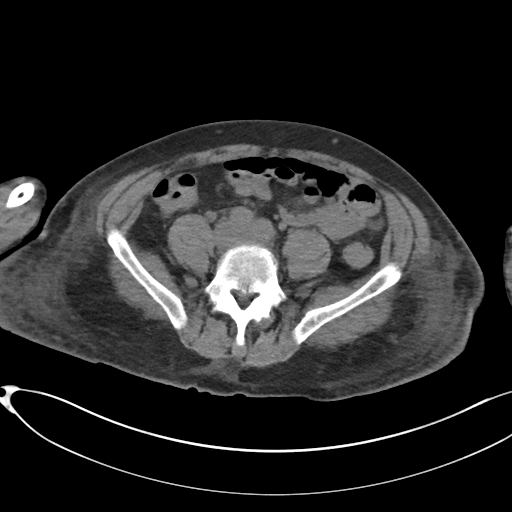
[im 3/48  bone]
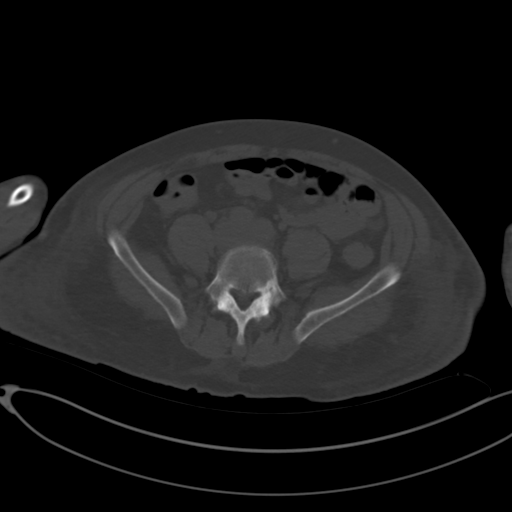
[im 7/48  soft-tissue]
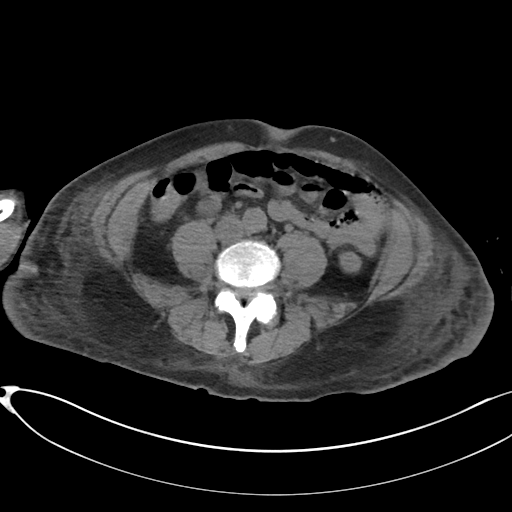
[im 11/48  soft-tissue]
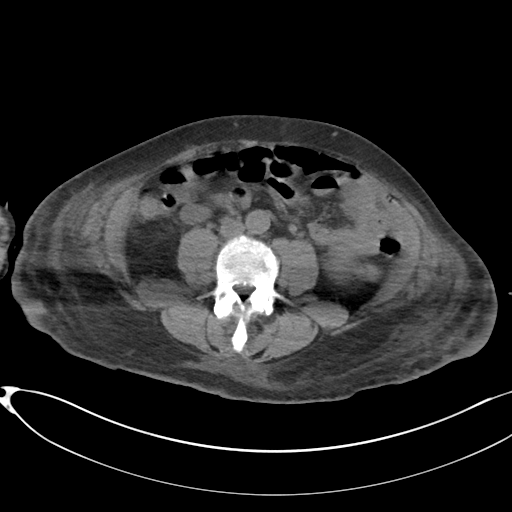
[im 15/48  soft-tissue]
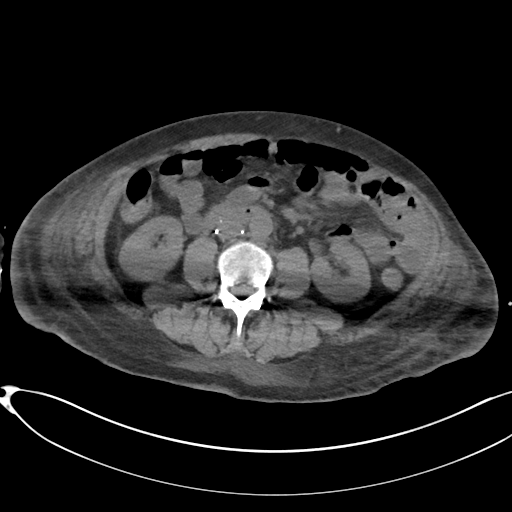
[im 18/48  soft-tissue]
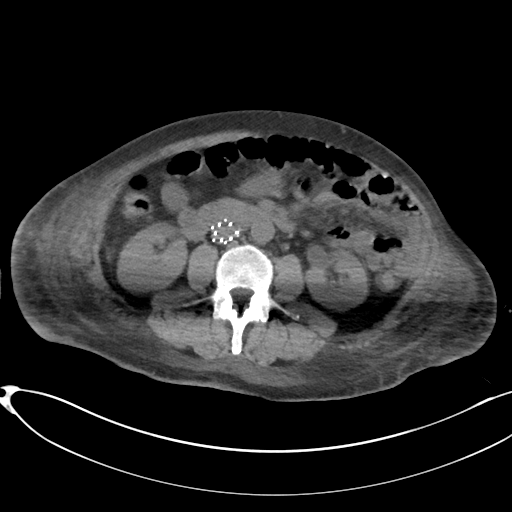
[im 22/48  soft-tissue]
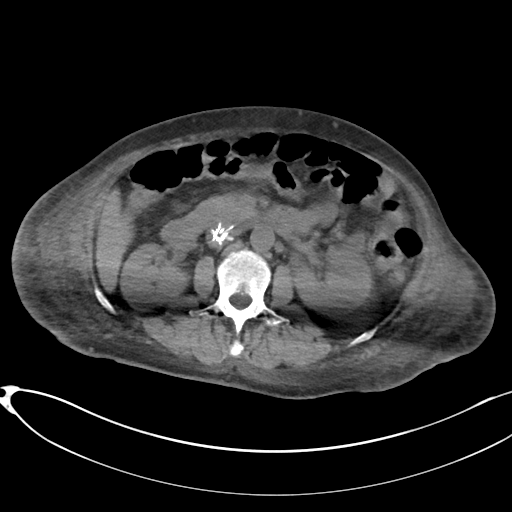
[im 26/48  soft-tissue]
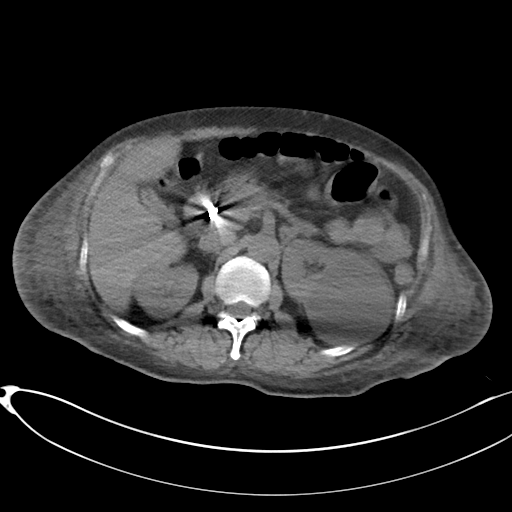
[im 30/48  soft-tissue]
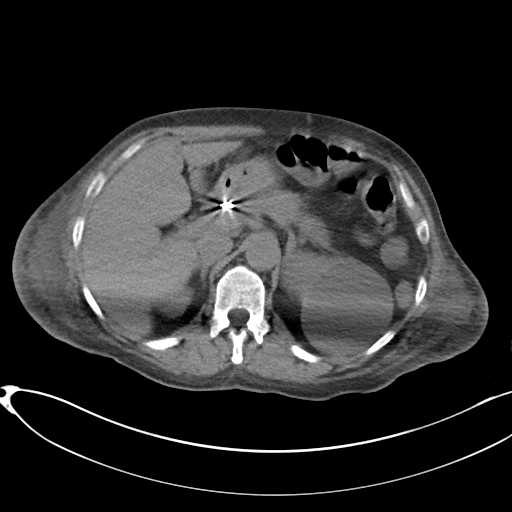
[im 33/48  soft-tissue]
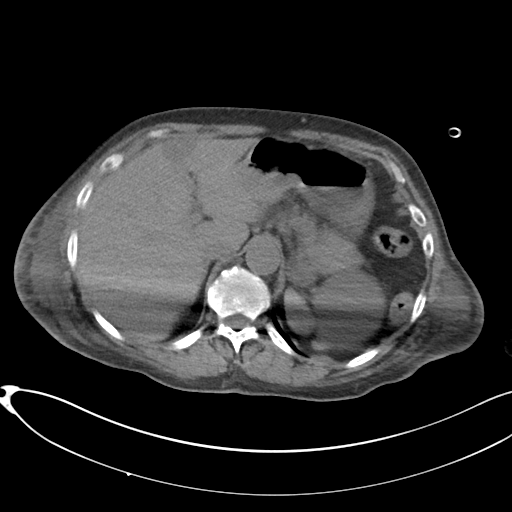
[im 33/48  bone]
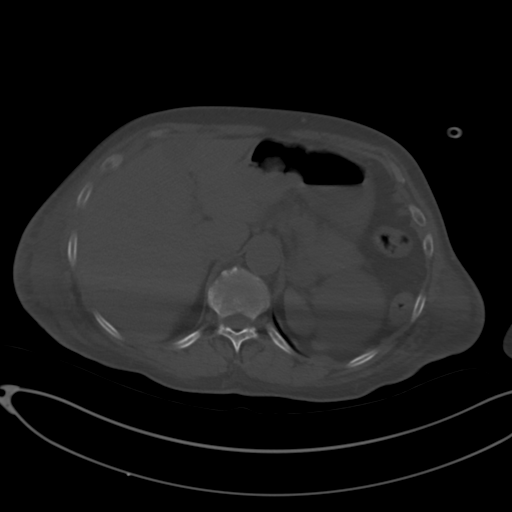
[im 37/48  soft-tissue]
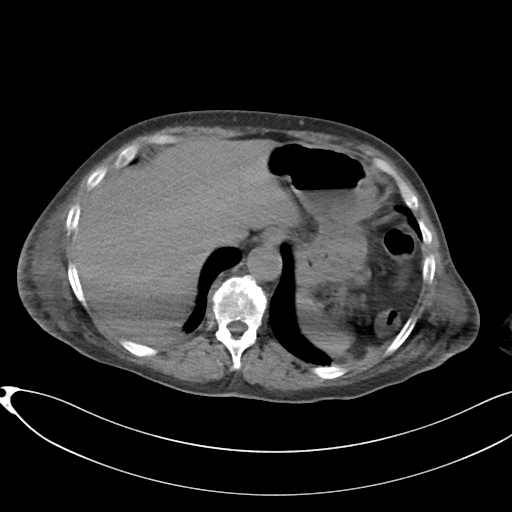
[im 41/48  soft-tissue]
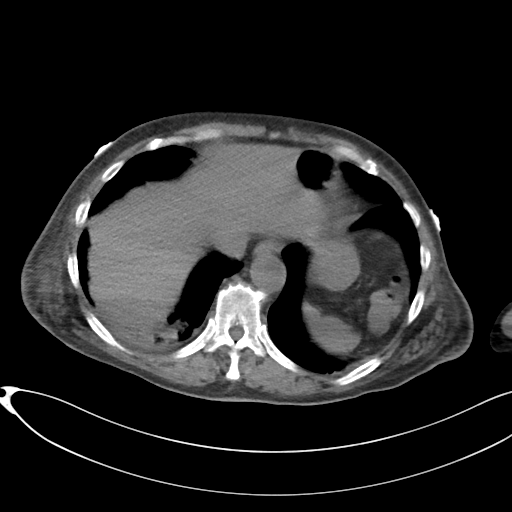
[im 45/48  soft-tissue]
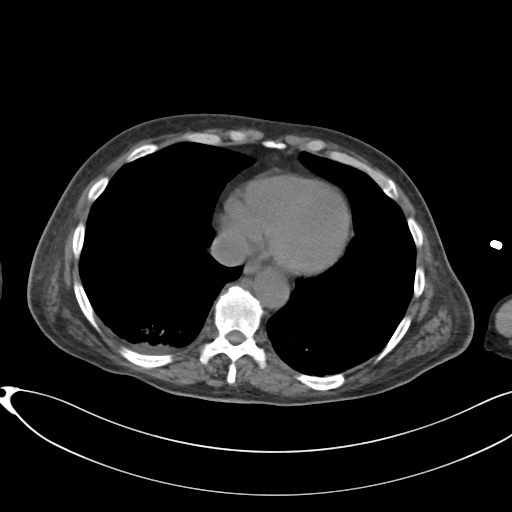

[Series 6: cor st · coronal · 0.54mm/px · 3 of 84 slices shown]
[im 28/84  soft-tissue]
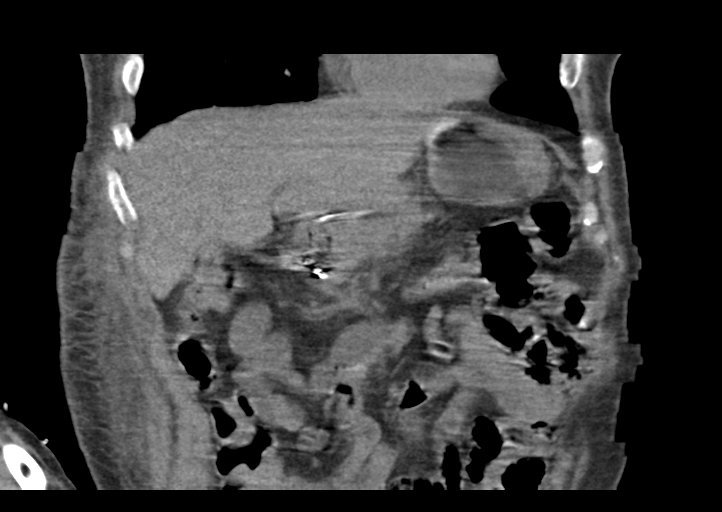
[im 37/84  soft-tissue]
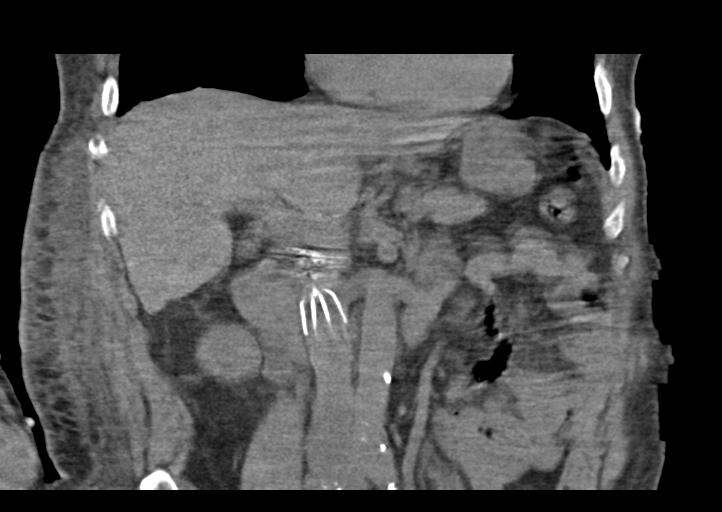
[im 47/84  soft-tissue]
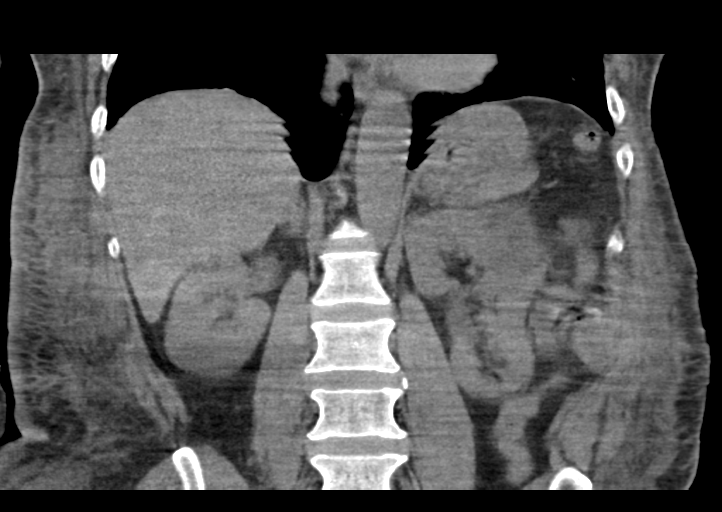

[15 of 46 positions shown; findings below may reference images not displayed]

FINDINGS: Lower chest: Limited visualization of the lower thorax demonstrates
development of a trace right-sided pleural effusion with associated
right basilar consolidative opacities. Minimal subpleural opacities
are seen within the imaged left lower lobe. Normal heart size.
Diffuse decreased attenuation of the intra cardiac blood pool
suggestive of anemia.

Hepatobiliary: Normal hepatic contour. Suspected focal fatty sparing
adjacent to the fissure for ligamentum teres, incompletely evaluated
on this noncontrast examination. The gallbladder is underdistended.
No radiopaque gallstones. No ascites.

Pancreas: Normal noncontrast appearance of the pancreas.

Spleen: The spleen appears diminutive.

Adrenals/Urinary Tract: Grossly unchanged appearance of previously
characterized approximately 7.9 x 7.0 cm exophytic cyst arising from
the superior pole the left kidney. No discrete right-sided renal
lesions on this noncontrast examination. No renal stones or urinary
obstruction. Normal noncontrast appearance the bilateral adrenal
glands. The urinary bladder was not imaged.

Stomach/Bowel: The anterior wall the stomach is well apposed against
the ventral wall of the upper abdomen without interposition of the
hepatic parenchyma, large or small bowel.

Embolization coils are noted about the gastric antrum and proximal
duodenum. Nonobstructive bowel gas pattern. No pneumoperitoneum,
pneumatosis or portal venous gas.

Vascular/Lymphatic: Minimal amount of atherosclerotic plaque within
normal caliber abdominal aorta. Post IVC filter placement.

No bulky retroperitoneal or mesenteric lymphadenopathy.

Other: Diffuse body wall anasarca.

Musculoskeletal: No acute or aggressive osseous abnormalities. Mild
(approximately 25%) compression deformities involving the T11 and
T12 vertebral bodies appear unchanged compared to the 03/07/2020
chest CT.
IMPRESSION: 1. Gastric anatomy amenable to potential percutaneous gastrostomy
tube placement as indicated.
2. Embolization coils adjacent to the gastric antrum and proximal
duodenum.
3. Post IVC filter placement.
4.  Aortic Atherosclerosis (A9LSD-TW5.5).
